# Patient Record
Sex: Female | Born: 1945 | Race: White | Hispanic: No | Marital: Married | Smoking: Former smoker
Health system: Southern US, Community
[De-identification: ages and names within clinical notes are randomized; demographics above are authoritative.]

## PROBLEM LIST (undated history)

## (undated) DIAGNOSIS — D649 Anemia, unspecified: Secondary | ICD-10-CM

## (undated) DIAGNOSIS — R7303 Prediabetes: Secondary | ICD-10-CM

## (undated) DIAGNOSIS — J189 Pneumonia, unspecified organism: Secondary | ICD-10-CM

## (undated) DIAGNOSIS — F419 Anxiety disorder, unspecified: Secondary | ICD-10-CM

## (undated) DIAGNOSIS — R519 Headache, unspecified: Secondary | ICD-10-CM

## (undated) DIAGNOSIS — Z87442 Personal history of urinary calculi: Secondary | ICD-10-CM

## (undated) DIAGNOSIS — J329 Chronic sinusitis, unspecified: Secondary | ICD-10-CM

## (undated) DIAGNOSIS — F32A Depression, unspecified: Secondary | ICD-10-CM

## (undated) DIAGNOSIS — R51 Headache: Secondary | ICD-10-CM

## (undated) DIAGNOSIS — K219 Gastro-esophageal reflux disease without esophagitis: Secondary | ICD-10-CM

## (undated) DIAGNOSIS — G4733 Obstructive sleep apnea (adult) (pediatric): Secondary | ICD-10-CM

## (undated) DIAGNOSIS — M1990 Unspecified osteoarthritis, unspecified site: Secondary | ICD-10-CM

## (undated) DIAGNOSIS — C9110 Chronic lymphocytic leukemia of B-cell type not having achieved remission: Secondary | ICD-10-CM

## (undated) DIAGNOSIS — R7309 Other abnormal glucose: Secondary | ICD-10-CM

## (undated) DIAGNOSIS — I10 Essential (primary) hypertension: Secondary | ICD-10-CM

## (undated) DIAGNOSIS — F329 Major depressive disorder, single episode, unspecified: Secondary | ICD-10-CM

## (undated) DIAGNOSIS — D126 Benign neoplasm of colon, unspecified: Secondary | ICD-10-CM

## (undated) HISTORY — PX: FUNCTIONAL ENDOSCOPIC SINUS SURGERY: SUR616

## (undated) HISTORY — PX: CATARACT EXTRACTION, BILATERAL: SHX1313

## (undated) HISTORY — DX: Prediabetes: R73.03

## (undated) HISTORY — PX: APPENDECTOMY: SHX54

## (undated) HISTORY — DX: Unspecified osteoarthritis, unspecified site: M19.90

## (undated) HISTORY — PX: LYMPH NODE BIOPSY: SHX201

## (undated) HISTORY — DX: Anxiety disorder, unspecified: F41.9

## (undated) HISTORY — PX: BREAST SURGERY: SHX581

## (undated) HISTORY — PX: TEMPOROMANDIBULAR JOINT ARTHROPLASTY: SUR76

## (undated) HISTORY — PX: ABDOMINAL HYSTERECTOMY: SHX81

## (undated) HISTORY — PX: TONSILLECTOMY AND ADENOIDECTOMY: SUR1326

## (undated) HISTORY — DX: Benign neoplasm of colon, unspecified: D12.6

## (undated) HISTORY — DX: Obstructive sleep apnea (adult) (pediatric): G47.33

## (undated) HISTORY — DX: Other abnormal glucose: R73.09

## (undated) HISTORY — PX: SHOULDER ARTHROSCOPY: SHX128

---

## 1983-12-06 HISTORY — PX: CHOLECYSTECTOMY: SHX55

## 2000-03-07 ENCOUNTER — Other Ambulatory Visit: Admission: RE | Admit: 2000-03-07 | Discharge: 2000-03-07 | Payer: Self-pay | Admitting: Obstetrics and Gynecology

## 2001-06-24 ENCOUNTER — Emergency Department (HOSPITAL_COMMUNITY): Admission: EM | Admit: 2001-06-24 | Discharge: 2001-06-24 | Payer: Self-pay | Admitting: Emergency Medicine

## 2001-06-24 ENCOUNTER — Encounter: Payer: Self-pay | Admitting: Internal Medicine

## 2003-07-31 ENCOUNTER — Other Ambulatory Visit: Admission: RE | Admit: 2003-07-31 | Discharge: 2003-07-31 | Payer: Self-pay | Admitting: Obstetrics and Gynecology

## 2004-05-13 ENCOUNTER — Ambulatory Visit (HOSPITAL_COMMUNITY): Admission: RE | Admit: 2004-05-13 | Discharge: 2004-05-13 | Payer: Self-pay | Admitting: Internal Medicine

## 2006-04-26 ENCOUNTER — Ambulatory Visit (HOSPITAL_COMMUNITY): Admission: RE | Admit: 2006-04-26 | Discharge: 2006-04-26 | Payer: Self-pay | Admitting: Internal Medicine

## 2006-07-10 ENCOUNTER — Ambulatory Visit (HOSPITAL_COMMUNITY): Admission: RE | Admit: 2006-07-10 | Discharge: 2006-07-10 | Payer: Self-pay | Admitting: Internal Medicine

## 2007-09-25 ENCOUNTER — Encounter: Admission: RE | Admit: 2007-09-25 | Discharge: 2007-09-25 | Payer: Self-pay | Admitting: Otolaryngology

## 2008-09-11 ENCOUNTER — Ambulatory Visit (HOSPITAL_COMMUNITY): Admission: RE | Admit: 2008-09-11 | Discharge: 2008-09-11 | Payer: Self-pay | Admitting: Internal Medicine

## 2008-11-18 ENCOUNTER — Ambulatory Visit: Payer: Self-pay | Admitting: Oncology

## 2008-12-10 LAB — CBC & DIFF AND RETIC
Basophils Absolute: 0 10*3/uL (ref 0.0–0.1)
EOS%: 2.5 % (ref 0.0–7.0)
Eosinophils Absolute: 0.3 10*3/uL (ref 0.0–0.5)
LYMPH%: 48.2 % — ABNORMAL HIGH (ref 14.0–48.0)
MCH: 33.4 pg (ref 26.0–34.0)
MCV: 95.5 fL (ref 81.0–101.0)
MONO%: 4.8 % (ref 0.0–13.0)
NEUT#: 4.9 10*3/uL (ref 1.5–6.5)
Platelets: 234 10*3/uL (ref 145–400)
RBC: 3.87 10*6/uL (ref 3.70–5.32)
Retic %: 1.3 % (ref 0.4–2.3)

## 2008-12-10 LAB — MORPHOLOGY

## 2008-12-10 LAB — CHCC SMEAR

## 2008-12-12 LAB — COMPREHENSIVE METABOLIC PANEL
ALT: 25 U/L (ref 0–35)
AST: 25 U/L (ref 0–37)
Alkaline Phosphatase: 69 U/L (ref 39–117)
Sodium: 139 mEq/L (ref 135–145)
Total Bilirubin: 0.6 mg/dL (ref 0.3–1.2)
Total Protein: 7 g/dL (ref 6.0–8.3)

## 2008-12-12 LAB — BETA 2 MICROGLOBULIN, SERUM: Beta-2 Microglobulin: 1.48 mg/L (ref 1.01–1.73)

## 2008-12-12 LAB — IMMUNOFIXATION ELECTROPHORESIS

## 2008-12-12 LAB — URIC ACID: Uric Acid, Serum: 7.4 mg/dL — ABNORMAL HIGH (ref 2.4–7.0)

## 2008-12-12 LAB — DIRECT ANTIGLOBULIN TEST (NOT AT ARMC)
DAT (Complement): NEGATIVE
DAT IgG: NEGATIVE

## 2008-12-18 ENCOUNTER — Encounter: Admission: RE | Admit: 2008-12-18 | Discharge: 2008-12-18 | Payer: Self-pay | Admitting: Oncology

## 2009-04-22 ENCOUNTER — Ambulatory Visit: Payer: Self-pay | Admitting: Oncology

## 2009-07-21 ENCOUNTER — Ambulatory Visit: Payer: Self-pay | Admitting: Oncology

## 2009-07-23 LAB — CBC WITH DIFFERENTIAL/PLATELET
Basophils Absolute: 0 10*3/uL (ref 0.0–0.1)
Eosinophils Absolute: 0.3 10*3/uL (ref 0.0–0.5)
HCT: 34.5 % — ABNORMAL LOW (ref 34.8–46.6)
HGB: 12 g/dL (ref 11.6–15.9)
LYMPH%: 57.8 % — ABNORMAL HIGH (ref 14.0–49.7)
MCV: 95.6 fL (ref 79.5–101.0)
MONO#: 0.6 10*3/uL (ref 0.1–0.9)
MONO%: 4.7 % (ref 0.0–14.0)
NEUT#: 4.4 10*3/uL (ref 1.5–6.5)
Platelets: 229 10*3/uL (ref 145–400)
WBC: 12.6 10*3/uL — ABNORMAL HIGH (ref 3.9–10.3)

## 2009-07-23 LAB — MORPHOLOGY: PLT EST: ADEQUATE

## 2009-10-21 ENCOUNTER — Ambulatory Visit: Payer: Self-pay | Admitting: Oncology

## 2009-10-23 LAB — CBC WITH DIFFERENTIAL/PLATELET
Basophils Absolute: 0.1 10*3/uL (ref 0.0–0.1)
Eosinophils Absolute: 0.4 10*3/uL (ref 0.0–0.5)
HCT: 37.6 % (ref 34.8–46.6)
HGB: 12.8 g/dL (ref 11.6–15.9)
LYMPH%: 64 % — ABNORMAL HIGH (ref 14.0–49.7)
MCV: 94.5 fL (ref 79.5–101.0)
MONO%: 3.5 % (ref 0.0–14.0)
NEUT#: 6.2 10*3/uL (ref 1.5–6.5)
NEUT%: 30.5 % — ABNORMAL LOW (ref 38.4–76.8)
Platelets: 190 10*3/uL (ref 145–400)
RBC: 3.98 10*6/uL (ref 3.70–5.45)

## 2010-01-05 DIAGNOSIS — J189 Pneumonia, unspecified organism: Secondary | ICD-10-CM

## 2010-01-05 HISTORY — DX: Pneumonia, unspecified organism: J18.9

## 2010-01-12 ENCOUNTER — Ambulatory Visit: Payer: Self-pay | Admitting: Oncology

## 2010-01-14 LAB — CBC WITH DIFFERENTIAL/PLATELET
Basophils Absolute: 0.1 10*3/uL (ref 0.0–0.1)
EOS%: 0.9 % (ref 0.0–7.0)
LYMPH%: 81.1 % — ABNORMAL HIGH (ref 14.0–49.7)
MCH: 32.8 pg (ref 25.1–34.0)
MCV: 96.4 fL (ref 79.5–101.0)
MONO%: 2.3 % (ref 0.0–14.0)
Platelets: 151 10*3/uL (ref 145–400)
RBC: 3.84 10*6/uL (ref 3.70–5.45)
RDW: 13.7 % (ref 11.2–14.5)

## 2010-01-14 LAB — MORPHOLOGY
PLT EST: ADEQUATE
RBC Comments: NORMAL

## 2010-04-07 ENCOUNTER — Ambulatory Visit: Admission: RE | Admit: 2010-04-07 | Discharge: 2010-04-07 | Payer: Self-pay | Admitting: Internal Medicine

## 2010-04-07 ENCOUNTER — Encounter (INDEPENDENT_AMBULATORY_CARE_PROVIDER_SITE_OTHER): Payer: Self-pay | Admitting: Internal Medicine

## 2010-04-07 ENCOUNTER — Ambulatory Visit: Payer: Self-pay | Admitting: Surgery

## 2010-04-15 ENCOUNTER — Ambulatory Visit: Payer: Self-pay | Admitting: Oncology

## 2010-04-16 LAB — COMPREHENSIVE METABOLIC PANEL
ALT: 30 U/L (ref 0–35)
AST: 44 U/L — ABNORMAL HIGH (ref 0–37)
Albumin: 4.1 g/dL (ref 3.5–5.2)
Alkaline Phosphatase: 50 U/L (ref 39–117)
Calcium: 10 mg/dL (ref 8.4–10.5)
Chloride: 98 mEq/L (ref 96–112)
Potassium: 3.7 mEq/L (ref 3.5–5.3)
Sodium: 133 mEq/L — ABNORMAL LOW (ref 135–145)

## 2010-04-16 LAB — CBC WITH DIFFERENTIAL/PLATELET
Basophils Absolute: 0.1 10*3/uL (ref 0.0–0.1)
Eosinophils Absolute: 0.2 10*3/uL (ref 0.0–0.5)
HGB: 12.2 g/dL (ref 11.6–15.9)
LYMPH%: 69.4 % — ABNORMAL HIGH (ref 14.0–49.7)
MCH: 33.2 pg (ref 25.1–34.0)
MCV: 96.4 fL (ref 79.5–101.0)
MONO%: 3.8 % (ref 0.0–14.0)
NEUT#: 5 10*3/uL (ref 1.5–6.5)
Platelets: 217 10*3/uL (ref 145–400)

## 2010-07-14 ENCOUNTER — Ambulatory Visit: Payer: Self-pay | Admitting: Oncology

## 2010-07-16 LAB — CBC & DIFF AND RETIC
Basophils Absolute: 0 10*3/uL (ref 0.0–0.1)
EOS%: 1.1 % (ref 0.0–7.0)
Eosinophils Absolute: 0.3 10*3/uL (ref 0.0–0.5)
LYMPH%: 78.1 % — ABNORMAL HIGH (ref 14.0–49.7)
MCH: 32.6 pg (ref 25.1–34.0)
MCV: 94 fL (ref 79.5–101.0)
MONO%: 2.6 % (ref 0.0–14.0)
NEUT#: 5.2 10*3/uL (ref 1.5–6.5)
Platelets: 140 10*3/uL — ABNORMAL LOW (ref 145–400)
RBC: 3.84 10*6/uL (ref 3.70–5.45)
Retic %: 1.52 % — ABNORMAL HIGH (ref 0.50–1.50)

## 2010-07-16 LAB — MORPHOLOGY

## 2010-08-13 ENCOUNTER — Encounter: Admission: RE | Admit: 2010-08-13 | Discharge: 2010-08-13 | Payer: Self-pay | Admitting: Family Medicine

## 2010-08-17 ENCOUNTER — Ambulatory Visit: Payer: Self-pay | Admitting: Oncology

## 2010-08-20 LAB — CBC WITH DIFFERENTIAL/PLATELET
BASO%: 0.5 % (ref 0.0–2.0)
Basophils Absolute: 0.2 10*3/uL — ABNORMAL HIGH (ref 0.0–0.1)
EOS%: 0.5 % (ref 0.0–7.0)
MCH: 32.3 pg (ref 25.1–34.0)
MCHC: 33.2 g/dL (ref 31.5–36.0)
MCV: 97.4 fL (ref 79.5–101.0)
MONO%: 2.2 % (ref 0.0–14.0)
RBC: 3.76 10*6/uL (ref 3.70–5.45)
RDW: 13.9 % (ref 11.2–14.5)
lymph#: 28.7 10*3/uL — ABNORMAL HIGH (ref 0.9–3.3)

## 2010-11-08 ENCOUNTER — Ambulatory Visit (HOSPITAL_BASED_OUTPATIENT_CLINIC_OR_DEPARTMENT_OTHER): Payer: 59 | Admitting: Oncology

## 2010-11-09 LAB — COMPREHENSIVE METABOLIC PANEL
ALT: 34 U/L (ref 0–35)
CO2: 28 mEq/L (ref 19–32)
Calcium: 10.1 mg/dL (ref 8.4–10.5)
Chloride: 93 mEq/L — ABNORMAL LOW (ref 96–112)
Sodium: 133 mEq/L — ABNORMAL LOW (ref 135–145)
Total Protein: 6.9 g/dL (ref 6.0–8.3)

## 2010-11-09 LAB — MORPHOLOGY: PLT EST: ADEQUATE

## 2010-11-09 LAB — CBC & DIFF AND RETIC
Eosinophils Absolute: 0.2 10*3/uL (ref 0.0–0.5)
HCT: 35.1 % (ref 34.8–46.6)
Immature Retic Fract: 13.7 % — ABNORMAL HIGH (ref 0.00–10.70)
LYMPH%: 83.8 % — ABNORMAL HIGH (ref 14.0–49.7)
MONO#: 0.7 10*3/uL (ref 0.1–0.9)
NEUT#: 5.2 10*3/uL (ref 1.5–6.5)
NEUT%: 13.7 % — ABNORMAL LOW (ref 38.4–76.8)
Platelets: 141 10*3/uL — ABNORMAL LOW (ref 145–400)

## 2010-11-09 LAB — LACTATE DEHYDROGENASE: LDH: 201 U/L (ref 94–250)

## 2010-12-26 ENCOUNTER — Encounter: Payer: Self-pay | Admitting: Internal Medicine

## 2010-12-27 ENCOUNTER — Inpatient Hospital Stay (HOSPITAL_COMMUNITY)
Admission: EM | Admit: 2010-12-27 | Discharge: 2010-12-29 | Payer: Self-pay | Source: Home / Self Care | Attending: Internal Medicine | Admitting: Internal Medicine

## 2010-12-28 LAB — URINALYSIS, ROUTINE W REFLEX MICROSCOPIC
Bilirubin Urine: NEGATIVE
Hgb urine dipstick: NEGATIVE
Nitrite: NEGATIVE
Protein, ur: 30 mg/dL — AB
Specific Gravity, Urine: 1.009 (ref 1.005–1.030)
Urobilinogen, UA: 1 mg/dL (ref 0.0–1.0)
pH: 7 (ref 5.0–8.0)

## 2010-12-28 LAB — DIFFERENTIAL
Basophils Absolute: 0 10*3/uL (ref 0.0–0.1)
Lymphocytes Relative: 85 % — ABNORMAL HIGH (ref 12–46)
Monocytes Relative: 1 % — ABNORMAL LOW (ref 3–12)
Neutro Abs: 5.3 10*3/uL (ref 1.7–7.7)

## 2010-12-28 LAB — URINE MICROSCOPIC-ADD ON

## 2010-12-28 LAB — CBC
Hemoglobin: 13.2 g/dL (ref 12.0–15.0)
MCH: 33.8 pg (ref 26.0–34.0)
RBC: 3.9 MIL/uL (ref 3.87–5.11)
WBC: 37.6 10*3/uL — ABNORMAL HIGH (ref 4.0–10.5)

## 2010-12-28 LAB — BASIC METABOLIC PANEL
Calcium: 9.2 mg/dL (ref 8.4–10.5)
Creatinine, Ser: 1.09 mg/dL (ref 0.4–1.2)
GFR calc Af Amer: >60 mL/min (ref >60)
GFR calc non Af Amer: 51 mL/min — ABNORMAL LOW (ref >60)

## 2010-12-28 LAB — POCT CARDIAC MARKERS
CKMB, poc: <1 ng/mL — ABNORMAL LOW (ref 1.0–8.0)
Myoglobin, poc: 223 ng/mL (ref 12–200)
Troponin i, poc: <0.05 ng/mL (ref 0.00–0.09)

## 2010-12-29 LAB — URINE CULTURE
Colony Count: 35000
Culture  Setup Time: 201201240146

## 2010-12-29 LAB — BASIC METABOLIC PANEL
BUN: 12 mg/dL (ref 6–23)
Chloride: 98 mEq/L (ref 96–112)
Creatinine, Ser: 0.92 mg/dL (ref 0.4–1.2)
GFR calc Af Amer: >60 mL/min (ref >60)
GFR calc non Af Amer: >60 mL/min (ref >60)

## 2010-12-29 LAB — CBC
MCH: 32.8 pg (ref 26.0–34.0)
MCV: 96.1 fL (ref 78.0–100.0)
Platelets: 127 10*3/uL — ABNORMAL LOW (ref 150–400)
RBC: 3.57 MIL/uL — ABNORMAL LOW (ref 3.87–5.11)
RDW: 12.9 % (ref 11.5–15.5)
WBC: 34.9 10*3/uL — ABNORMAL HIGH (ref 4.0–10.5)

## 2010-12-30 LAB — URINE CULTURE: Colony Count: 30000

## 2010-12-30 NOTE — Discharge Summary (Addendum)
NAME:  Rebecca Bond, Rebecca Bond NO.:  [id number]  MEDICAL RECORD NO.:  [id number]          PATIENT TYPE:  INP  LOCATION:  1501                         FACILITY:  [facility]  PHYSICIAN:  Andreas Blower, MD       DATE OF BIRTH:  [birth year]  DATE OF ADMISSION:  12/27/2010 DATE OF DISCHARGE:                              DISCHARGE SUMMARY   PRIMARY CARE PHYSICIAN:  [name], M.D.  ONCOLOGIST:  Genene Churn. Cyndie Chime, M.D.  DISCHARGE DIAGNOSIS: 1. Community-acquired pneumonia. 2. Urinary tract infection. 3. History of chronic lymphocytic leukemia. 4. Leukocytosis. 5. Hypertension. 6. Anxiety. 7. Remote history of hysterectomy. 8. Remote history of cholecystectomy. 9. Remote temporomandibular joint surgery.  DISCHARGE MEDICATIONS: 1. Robitussin DM 5 mL every 4 hours as needed for cough. 2. Levofloxacin 500 mg p.o. daily for six more days to complete 8-day     course of antibiotics. 3. Alprazolam 0.25 mg p.o. daily as needed for anxiety. 4. Ascorbic acid 500 mg over-the-counter p.o. daily. 5. Atenolol 100 mg p.o. daily. 6. Bupropion extended release 200 mg p.o. daily. 7. Calcium citrate 500 mg p.o. daily. 8. Fish oil 2000 mg p.o. daily. 9. Hydrochlorothiazide 25 mg p.o. daily. 10.Vesicare 10 mg p.o. daily. 11.Vitamin D2, 2000 units 3 tablets p.o. daily. 12.Xyzal 5 mg daily as needed for allergies.  BRIEF ADMITTING HISTORY AND PHYSICAL:  Rebecca Bond is a 65 year old, Caucasian female with a history of stage I CLL and hypertension who presented with complaints of productive cough and fever.  RADIOLOGY:  The patient had a chest x-ray, two-view, which shows patchy infiltrative density in the left base, more likely reflects atelectasis and pneumonia. The patient had another chest x-ray, two-view, on December 28, 2010, which shows no change in the left lower lung lobe pneumonia.  LABORATORY DATA:  CBC shows a white count of 34.9.  Initial white count on admission was  37.6, hemoglobin 11.7, hematocrit 34.3, platelet count 127.  Electrolytes normal with a creatinine of 0.92, except sodium is 133.  UA initially on admission was negative for nitrates and had large leukocytes.  HOSPITAL COURSE BY PROBLEM: 1. Community-acquired pneumonia.  The patient had failed outpatient     treatment on azithromycin.  The patient was started on     levofloxacin.  The patient was given IV dose for two days and was     transitioned to oral dose at the time of discharge.  The patient is     to continue levofloxacin for six more days to complete an 8-day     course of antibiotics. 2. Urinary tract infection.  The patient was again started on     levofloxacin, which she will continue for six more days. 3. History of chronic lymphocytic leukemia.  The patient to follow     with laboratory requirement for treatment as an outpatient. 4. Leukocytosis, most likely secondary to chronic lymphocytic leukemia     with recent community-acquired pneumonia and urinary tract     infection. 5. Hypertension, stable.  Continue on home medications. 6. Anxiety.  Continue the patient on alprazolam.  DISPOSITION AND FOLLOWUP:  The patient will follow  up with her primary care physician in 1-2 weeks.  If her symptoms are not improving at that time, consider getting a repeat chest x-ray at that time.  Time spent on discharge talking to the patient and coordinating care was 25 minutes.   Andreas Blower, MD   SR/MEDQ  D:  12/29/2010  T:  12/29/2010  Job:  [id number]  cc:   [name], M.D. Fax: [phone]  Genene Churn. Cyndie Chime, M.D. Fax: [phone]  Electronically Signed by Wardell Heath Eston Heslin  on 12/30/2010 08:24:20 PM

## 2011-02-08 ENCOUNTER — Encounter: Payer: 59 | Admitting: Oncology

## 2011-02-08 DIAGNOSIS — C9110 Chronic lymphocytic leukemia of B-cell type not having achieved remission: Secondary | ICD-10-CM

## 2011-02-08 LAB — CBC WITH DIFFERENTIAL/PLATELET
BASO%: 0.2 % (ref 0.0–2.0)
EOS%: 0.7 % (ref 0.0–7.0)
HCT: 35.1 % (ref 34.8–46.6)
MCH: 32.8 pg (ref 25.1–34.0)
MCHC: 34.5 g/dL (ref 31.5–36.0)
MONO%: 1.6 % (ref 0.0–14.0)
NEUT%: 10.4 % — ABNORMAL LOW (ref 38.4–76.8)
RDW: 13.6 % (ref 11.2–14.5)
lymph#: 47 10*3/uL — ABNORMAL HIGH (ref 0.9–3.3)

## 2011-02-15 NOTE — H&P (Signed)
NAME:  Rebecca Bond, PUGA              ACCOUNT NO.:  [id number]  MEDICAL RECORD NO.:  [id number]          PATIENT TYPE:  INP  LOCATION:  0102                         [facility]ACILITY:  [facility]  PHYSICIAN:  Erick Blinks, MD     DATE OF BIRTH:  06-Mar-1946  DATE OF ADMISSION:  12/27/2010 DATE OF DISCHARGE:                             HISTORY & PHYSICAL   PRIMARY CARE PHYSICIAN:  [name], MD  ONCOLOGIST:  Genene Churn. Granfortuna, MD  CHIEF COMPLAINT:  Persistent cough and fever.  HISTORY OF PRESENT ILLNESS:  The patient is a 65 year old with past medical history of stage I CLL and hypertension, who presents to [facility] emergency department today with complaints of persistent productive cough and fever.  The patient was seen at her primary care physician's office on December 22, 2010 with complaints of cough, fever, and malaise. At that time, the patient was treated with a prednisone taper and azithromycin for suspected bronchitis.  The patient's antibiotics prescription ended on December 26, 2010.  She has one more day of prednisone to take; however, the patient describes persistent chills with low-grade fever yesterday as well as continued cough productive of thick sputum.  The patient also reports decreased p.o. intake.  The patient states she only feels shortness of breath with "coughing fits" that have been uncontrollable.  She does report some rib pain with coughing.  She denies any recent chest pain.  She denies any recent abdominal pain, vomiting, or diarrhea.  Of note, the patient was seen by her oncologist in December 2011, at which time her white blood cell count was 38,000, platelet count at that time was 183,000.  Chest x-ray upon admission evaluation reveals a left lower lobe pneumonia.  The patient is to be admitted at by triad hospitalist at this time for further evaluation and treatment.  PAST MEDICAL HISTORY: 1. CLL, stage I. 2. Hypertension. 3. Remote  hysterectomy. 4. Remote cholecystectomy. 5. Remote TMJ surgery.  MEDICATIONS: 1. HCTZ 25 mg p.o. daily. 2. Bupropion 300 mg p.o. daily. 3. Atenolol 100 mg p.o. daily. 4. Xanax 0.5 mg taken p.r.n. anxiety. 5. Vesicare 10 mg p.o. daily.  ALLERGIES: 1. HYZAAR causes angioedema. 2. DECONGESTANTS cause palpitations.  FAMILY HISTORY:  Has been reviewed and is noncontributory to this admission.  SOCIAL HISTORY:  The patient is married.  She reports remote history of tobacco abuse.  She has not smoked in over 40 years.  No EtOH use.  REVIEW OF SYSTEMS:  As stated in HPI, otherwise negative.  The patient denies abdominal pain, vomiting, diarrhea, and dysuria  PHYSICAL EXAMINATION:  VITAL SIGNS:  Blood pressure 150/78, heart rate 67, respirations 18, temperature 98.2, O2 sat is 95% on room air. GENERAL:  This is a well-nourished, well-developed female sitting upright in stretcher in no acute distress. HEENT:  Head is normocephalic, atraumatic.  Eyes, extraocular movements intact without scleral icterus or injection.  Ear, nose and throat, mucous membranes are dry.  No oropharyngeal lesions noted. NECK:  Supple with no thyromegaly or lymphadenopathy.  No JVD or carotid bruits. CHEST:  Symmetrical movement. CARDIOVASCULAR:  S1 and S2 regular rate and rhythm.  No murmurs, rub, or gallops. LOWER EXTREMITY:  No edema. RESPIRATORY:  Patient with scattered rales, very mild expiratory wheezing.  No increased work of breathing.  The patient does experienced persistent cough with deep inspiration. GASTROINTESTINAL:  Abdomen is soft, nontender, nondistended with positive bowel sounds.  No appreciated masses or hepatosplenomegaly. NEUROLOGICAL:  The patient is able to move all extremities x4 without motor, sensory deficit on exam. PSYCHOLOGICAL:  The patient is alert and oriented x4 and slightly anxious.  LABORATORY DATA:  White cell count 37.6 with an 85% lymphocytes, platelets 126,  hemoglobin 13.2, hematocrit 37.1, sodium 131, potassium 3.5, chloride 94, CO2 24, BUN 20, creatinine 1.09 up from 0.87 in December 2011, serum glucose 86.  Cardiac point-of-care are negative x1.  Urinalysis amber, cloudy with specific gravity of 1.02, small bilirubin, trace ketones and large leukocytes.  Urine microscopic shows many epithelials with 21 to 50 WBCs.  Chest x-ray shows a left lower lobe pneumonia.  ASSESSMENT/PLAN: 1. Community-acquired pneumonia.  The patient has failed outpatient     antibiotic therapy with azithromycin.  We will admit the patient at     this time to regular unit.  We will order for empiric Levaquin for     coverage of possible urinary tract infection and pneumonia.  We     will check sputum culture and sensitivity as well as blood cultures     should the patient develop fever greater than 101.0.  we will     recheck the patient's chest x-ray in the morning to determine if     any improvement and order for supportive treatment. 2. Acute renal failure.  Suspect prerenal in nature given dehydration     in setting of decreased p.o. intake and continued diuretic use.  We     will order for gentle IV fluids.  We will recheck the patient's     creatinine in the morning and hold home diuretic. 3. Possible urinary tract infection.  Empiric treatment along with     pneumonia.  We will recollect specimen and sent for culture and     sensitivity. 4. Chronic lymphocytic leukemia, seemingly stable with today's blood     count being similar to those obtained in     December 2011.  The patient is scheduled follow up with Dr.     Cyndie Chime next month. 5. History of hypertension.  We will continue beta-blocker therapy for     now. 6. Prophylaxis.  We will order for SCDs for DVT prophylaxis.     Cordelia Pen, NP   ______________________________ Erick Blinks, MD    LE/MEDQ  D:  12/27/2010  T:  12/27/2010  Job:  [id number]  cc:   [name],  M.D. Fax: [phone]  Genene Churn. Cyndie Chime, M.D. Fax: [phone]  Electronically Signed by Cordelia Pen NP on 01/21/2011 12:03:55 PM Electronically Signed by Durward Mallard Zaydah Nawabi  on 02/15/2011 05:40:57 PM

## 2011-07-22 ENCOUNTER — Encounter (HOSPITAL_BASED_OUTPATIENT_CLINIC_OR_DEPARTMENT_OTHER): Payer: 59 | Admitting: Oncology

## 2011-07-22 ENCOUNTER — Other Ambulatory Visit: Payer: Self-pay | Admitting: Oncology

## 2011-07-22 DIAGNOSIS — C9110 Chronic lymphocytic leukemia of B-cell type not having achieved remission: Secondary | ICD-10-CM

## 2011-07-22 LAB — COMPREHENSIVE METABOLIC PANEL
Albumin: 3.9 g/dL (ref 3.5–5.2)
CO2: 27 mEq/L (ref 19–32)
Calcium: 9.7 mg/dL (ref 8.4–10.5)
Glucose, Bld: 97 mg/dL (ref 70–99)
Potassium: 4.1 mEq/L (ref 3.5–5.3)
Sodium: 129 mEq/L — ABNORMAL LOW (ref 135–145)
Total Bilirubin: 0.4 mg/dL (ref 0.3–1.2)
Total Protein: 6.8 g/dL (ref 6.0–8.3)

## 2011-07-22 LAB — CBC & DIFF AND RETIC
Basophils Absolute: 0 10*3/uL (ref 0.0–0.1)
Eosinophils Absolute: 0.2 10*3/uL (ref 0.0–0.5)
HCT: 35.1 % (ref 34.8–46.6)
HGB: 11.8 g/dL (ref 11.6–15.9)
Immature Retic Fract: 12.8 % — ABNORMAL HIGH (ref 1.60–10.00)
LYMPH%: 83.9 % — ABNORMAL HIGH (ref 14.0–49.7)
MCHC: 33.7 g/dL (ref 31.5–36.0)
MONO#: 0.9 10*3/uL (ref 0.1–0.9)
NEUT#: 13.5 10*3/uL — ABNORMAL HIGH (ref 1.5–6.5)
NEUT%: 14.9 % — ABNORMAL LOW (ref 38.4–76.8)
Platelets: 194 10*3/uL (ref 145–400)
Retic Ct Abs: 83.9 10*3/uL (ref 33.70–90.70)
WBC: 90.7 10*3/uL (ref 3.9–10.3)
lymph#: 76.1 10*3/uL — ABNORMAL HIGH (ref 0.9–3.3)

## 2011-07-22 LAB — LACTATE DEHYDROGENASE: LDH: 236 U/L (ref 94–250)

## 2011-07-26 LAB — IMMUNOFIXATION ELECTROPHORESIS
IgM, Serum: 32 mg/dL — ABNORMAL LOW (ref 52–322)
Total Protein, Serum Electrophoresis: 6.5 g/dL (ref 6.0–8.3)

## 2011-09-19 ENCOUNTER — Encounter (HOSPITAL_BASED_OUTPATIENT_CLINIC_OR_DEPARTMENT_OTHER): Payer: 59 | Admitting: Oncology

## 2011-09-19 ENCOUNTER — Other Ambulatory Visit: Payer: Self-pay | Admitting: Oncology

## 2011-09-19 DIAGNOSIS — C9110 Chronic lymphocytic leukemia of B-cell type not having achieved remission: Secondary | ICD-10-CM

## 2011-09-19 LAB — CBC WITH DIFFERENTIAL/PLATELET
Basophils Absolute: 0.4 10*3/uL — ABNORMAL HIGH (ref 0.0–0.1)
EOS%: 0.3 % (ref 0.0–7.0)
HGB: 11.9 g/dL (ref 11.6–15.9)
MCH: 32.3 pg (ref 25.1–34.0)
MCHC: 33.7 g/dL (ref 31.5–36.0)
MCV: 95.9 fL (ref 79.5–101.0)
MONO%: 2.1 % (ref 0.0–14.0)
RBC: 3.68 10*6/uL — ABNORMAL LOW (ref 3.70–5.45)
RDW: 13.7 % (ref 11.2–14.5)

## 2011-09-19 LAB — MORPHOLOGY

## 2011-11-11 ENCOUNTER — Other Ambulatory Visit: Payer: Self-pay

## 2011-11-11 ENCOUNTER — Telehealth: Payer: Self-pay

## 2011-11-11 NOTE — Telephone Encounter (Signed)
Received VM from pt stating that she has a sinus or upper resp infection and is currently taking Mucinex, Advil, Vitamin C, along with nasal sprays to control her sx.  Pt is scheduled for lab on 12/10, but questions if she should r/s, d/t her WBC trending upwards.    Per Misty Stanley, NP, pt's lab appt to be pushed out 1 week.   Onc Tx scheduling encounter forwarded to scheduling to change appt from 12/10 to 12/17.   This information left on pt's personalized VM per her request.  dph

## 2011-11-14 ENCOUNTER — Other Ambulatory Visit: Payer: 59 | Admitting: Lab

## 2011-11-16 ENCOUNTER — Telehealth: Payer: Self-pay | Admitting: Oncology

## 2011-11-16 NOTE — Telephone Encounter (Signed)
Called pt, left message , lab draw before MD visit on 12/17th

## 2011-11-17 ENCOUNTER — Telehealth: Payer: Self-pay | Admitting: *Deleted

## 2011-11-17 NOTE — Telephone Encounter (Signed)
Message on Rebecca Bond called asking if she needs to keep her scheduled appts for lab 11-21-11 at 2:00pm and f/u 11-22-11 at 12:00 pm.  If she does need to keep these appointments she can't get away from work and they need to be rearranged.   Reports she is still taking amoxicillin 500 mg tid through 11-20-11.  Has a scratchy throat and low grade fever often in the afternoon.  Taking tylenol or ibuprofen.  Will notify providers.  Message left on patient's voice mail requesting a return call with work hours or times she'll be available if appts can be rescheduled.

## 2011-11-17 NOTE — Telephone Encounter (Signed)
Ms. Rebecca Bond called reporting she cannot come in early mornings or at lunch times.  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  Asked if the lab appointment could be at 4:00pm and the MD follow up at 3:30pm or 3:00pm If Dr. Cyndie Chime wants her to keep these dates and doesn't think the ibuprofen will affect her lab results.[name] Kitchen

## 2011-11-18 ENCOUNTER — Other Ambulatory Visit: Payer: Self-pay

## 2011-11-21 ENCOUNTER — Telehealth: Payer: Self-pay | Admitting: Oncology

## 2011-11-21 ENCOUNTER — Other Ambulatory Visit: Payer: 59 | Admitting: Lab

## 2011-11-21 NOTE — Telephone Encounter (Signed)
Called pt , left message that Dr Reece Agar first opening is late February 2013

## 2011-11-22 ENCOUNTER — Ambulatory Visit (HOSPITAL_BASED_OUTPATIENT_CLINIC_OR_DEPARTMENT_OTHER): Payer: 59 | Admitting: Oncology

## 2011-11-22 ENCOUNTER — Other Ambulatory Visit: Payer: Self-pay

## 2011-11-22 ENCOUNTER — Telehealth: Payer: Self-pay | Admitting: Oncology

## 2011-11-22 ENCOUNTER — Encounter: Payer: Self-pay | Admitting: Oncology

## 2011-11-22 ENCOUNTER — Other Ambulatory Visit: Payer: Self-pay | Admitting: Oncology

## 2011-11-22 ENCOUNTER — Ambulatory Visit: Payer: 59

## 2011-11-22 VITALS — BP 176/81 | HR 71 | Temp 97.1°F | Ht 63.5 in | Wt 193.1 lb

## 2011-11-22 DIAGNOSIS — Z8619 Personal history of other infectious and parasitic diseases: Secondary | ICD-10-CM

## 2011-11-22 DIAGNOSIS — I10 Essential (primary) hypertension: Secondary | ICD-10-CM

## 2011-11-22 DIAGNOSIS — C9110 Chronic lymphocytic leukemia of B-cell type not having achieved remission: Secondary | ICD-10-CM

## 2011-11-22 LAB — CBC WITH DIFFERENTIAL/PLATELET
BASO%: 0.2 % (ref 0.0–2.0)
Basophils Absolute: 0.1 10*3/uL (ref 0.0–0.1)
EOS%: 0.5 % (ref 0.0–7.0)
HCT: 34.7 % — ABNORMAL LOW (ref 34.8–46.6)
HGB: 11.9 g/dL (ref 11.6–15.9)
MONO#: 0.8 10*3/uL (ref 0.1–0.9)
NEUT%: 15.6 % — ABNORMAL LOW (ref 38.4–76.8)
RDW: 13.3 % (ref 11.2–14.5)
WBC: 52.2 10*3/uL (ref 3.9–10.3)
lymph#: 42.9 10*3/uL — ABNORMAL HIGH (ref 0.9–3.3)

## 2011-11-22 LAB — COMPREHENSIVE METABOLIC PANEL
ALT: 28 U/L (ref 0–35)
Albumin: 4.4 g/dL (ref 3.5–5.2)
CO2: 30 mEq/L (ref 19–32)
Potassium: 4.1 mEq/L (ref 3.5–5.3)
Sodium: 133 mEq/L — ABNORMAL LOW (ref 135–145)
Total Bilirubin: 0.4 mg/dL (ref 0.3–1.2)
Total Protein: 6.3 g/dL (ref 6.0–8.3)

## 2011-11-22 LAB — LACTATE DEHYDROGENASE: LDH: 215 U/L (ref 94–250)

## 2011-11-22 LAB — MORPHOLOGY: PLT EST: ADEQUATE

## 2011-11-22 MED ORDER — PNEUMOCOCCAL VAC POLYVALENT 25 MCG/0.5ML IJ INJ
0.5000 mL | INJECTION | INTRAMUSCULAR | Status: DC
  Filled 2011-11-22: qty 0.5

## 2011-11-22 NOTE — Telephone Encounter (Signed)
gve the pt her feb,april 2013 appt calendar

## 2011-11-22 NOTE — Progress Notes (Signed)
Followup visit for this 65 year old woman with stage I chronic lymphocytic leukemia diagnosed incidentally at time of a routine mammogram done in December 2009 which showed an enlarged left axillary lymph node. This was biopsied and consistent with CLL. Laboratory studies at that time with a total white count 11,056% lymphocytes hemoglobin 12.8 with a count 230,000. On clinical exam a single lymph node palpable high in the left axilla but no other areas of adenopathy or organomegaly. We have been monitoring blood counts in our office since diagnosis. She's had some mild wide fluctuations in the total white count which has gotten as high as 91,000 on  8/17. I believe she was on some steroids for a bronchitis at that time. Total white count 52,000 today with 82% lymphocytes. Stable hemoglobin of 11.9 and platelet count at 204,000.   She was hospitalized for a few days in January of this year for pneumonitis. She's had no other interim infections or other medical problems. Review of systems otherwise unremarkable.  Exam: Head and neck are normal. Oropharynx no erythema or exudate. Lung exam: Clear to auscultation resonant to percussion. Cardiac exam: Regular cardiac rhythm no murmur Lymph node exam: I'm unable to palpate any lymph nodes in the neck supraclavicular axillary or inguinal regions. Abdominal exam: Obese. No obvious mass or organomegaly. Extremities: No edema no calf tenderness. Neurologic exam: No focal deficits.  Impression: #1. Stage I chronic lymphocytic leukemia. A despite trend for white blood count to rise over time with some fluctuations, hemoglobin and platelets remain stable. No adenopathy or organomegaly on exam. I feel we are safe to continue observation alone. I am checking blood counts every 2 months and physical exams every 4 months. We may be able to reduce this if she continues to remain stable. She did get her flu vaccine this year. She also received a Pneumovax vaccine  when she was in the hospital in January and we were able to confirm this. At present we are not recommending the is asked her vaccine which is a live virus vaccine in immunocompromised patients. She does give a previous history of herpes zoster infection.  Copy dictation: Dr Glade Lloyd

## 2011-11-22 NOTE — Progress Notes (Signed)
Patient ID: Rebecca Bond, female   DOB: 12/24/1945, 65 y.o.   MRN: [id number]  

## 2011-11-22 NOTE — Progress Notes (Signed)
Patient ID: Rebecca Bond, female   DOB: April 01, 1946, 65 y.o.   MRN: [id number]

## 2012-01-10 ENCOUNTER — Telehealth: Payer: Self-pay | Admitting: *Deleted

## 2012-01-10 NOTE — Telephone Encounter (Signed)
Received call this am from pt reporting that she is just not feeling good but can't put her finger on it.  She reports feeling tired, nauseated & had a h/a yest & couldn't get out of bed to go to work.  She is at work today & no nausea or h/a but still feels a little tired.  She reports no fever & no other symptoms.  She has a lab appt 01/24/12.  Discussed with pt & Lonna Cobb NP & all are comfortable with just waiting to see if symptoms improve but if need be, we can move lab appt to sooner.  She will call us if this needs to happen.

## 2012-01-24 ENCOUNTER — Other Ambulatory Visit (HOSPITAL_BASED_OUTPATIENT_CLINIC_OR_DEPARTMENT_OTHER): Payer: 59 | Admitting: Lab

## 2012-01-24 ENCOUNTER — Telehealth: Payer: Self-pay

## 2012-01-24 DIAGNOSIS — C9110 Chronic lymphocytic leukemia of B-cell type not having achieved remission: Secondary | ICD-10-CM

## 2012-01-24 LAB — CBC WITH DIFFERENTIAL/PLATELET
Basophils Absolute: 0.5 10*3/uL — ABNORMAL HIGH (ref 0.0–0.1)
EOS%: 0.6 % (ref 0.0–7.0)
HCT: 34.1 % — ABNORMAL LOW (ref 34.8–46.6)
HGB: 11.7 g/dL (ref 11.6–15.9)
MCH: 32.3 pg (ref 25.1–34.0)
MCHC: 34.3 g/dL (ref 31.5–36.0)
MCV: 94.2 fL (ref 79.5–101.0)
MONO%: 2 % (ref 0.0–14.0)
NEUT%: 10.6 % — ABNORMAL LOW (ref 38.4–76.8)
RDW: 13.7 % (ref 11.2–14.5)

## 2012-01-24 NOTE — Telephone Encounter (Signed)
Message copied by Albertha Ghee on Tue Jan 24, 2012  5:06 PM ------      Message from: Levert Feinstein      Created: Tue Jan 24, 2012  5:00 PM       Call pt WBC stable - same as last time 52,000

## 2012-01-24 NOTE — Telephone Encounter (Signed)
Pt notified of lab results per Dr Patsy Lager note.  Pt verbalizes appreciation and understanding. dph

## 2012-02-07 ENCOUNTER — Encounter: Payer: Self-pay | Admitting: Gastroenterology

## 2012-03-19 ENCOUNTER — Telehealth: Payer: Self-pay

## 2012-03-19 NOTE — Telephone Encounter (Signed)
Received call from pt to notify Dr Cyndie Chime she was treated with "steroids and antibiotics" for a "sinus infection" from 3/28 - 4/3.  She continues to use Omnaris nasal spray daily.  Pt is scheduled for labwork tomorrow and questions if she should r/s.    Note to Dr Cyndie Chime.

## 2012-03-19 NOTE — Telephone Encounter (Signed)
Per Dr Cyndie Chime - keep lab appt tomorrow.  This information left on pt's VM per her request. dph

## 2012-03-20 ENCOUNTER — Telehealth: Payer: Self-pay | Admitting: *Deleted

## 2012-03-20 ENCOUNTER — Other Ambulatory Visit (HOSPITAL_BASED_OUTPATIENT_CLINIC_OR_DEPARTMENT_OTHER): Payer: 59 | Admitting: Lab

## 2012-03-20 DIAGNOSIS — C9110 Chronic lymphocytic leukemia of B-cell type not having achieved remission: Secondary | ICD-10-CM

## 2012-03-20 LAB — CBC WITH DIFFERENTIAL/PLATELET
BASO%: 0.2 % (ref 0.0–2.0)
EOS%: 0.3 % (ref 0.0–7.0)
HCT: 34.8 % (ref 34.8–46.6)
LYMPH%: 86.3 % — ABNORMAL HIGH (ref 14.0–49.7)
MCH: 32.1 pg (ref 25.1–34.0)
MCHC: 33.6 g/dL (ref 31.5–36.0)
MCV: 95.3 fL (ref 79.5–101.0)
MONO%: 1.5 % (ref 0.0–14.0)
NEUT%: 11.7 % — ABNORMAL LOW (ref 38.4–76.8)
Platelets: 145 10*3/uL (ref 145–400)

## 2012-03-20 NOTE — Telephone Encounter (Signed)
Pt notified of lab result per Dr. Cyndie Chime.

## 2012-03-20 NOTE — Telephone Encounter (Signed)
Message copied by Sabino Snipes on Tue Mar 20, 2012  5:26 PM ------      Message from: Levert Feinstein      Created: Tue Mar 20, 2012  4:29 PM       Call pt WBC 66,000 - within range of previous values despite steroids

## 2012-03-27 ENCOUNTER — Ambulatory Visit (HOSPITAL_BASED_OUTPATIENT_CLINIC_OR_DEPARTMENT_OTHER): Payer: 59 | Admitting: Oncology

## 2012-03-27 ENCOUNTER — Telehealth: Payer: Self-pay | Admitting: Oncology

## 2012-03-27 VITALS — BP 145/73 | HR 59 | Temp 97.1°F | Ht 63.5 in | Wt 190.3 lb

## 2012-03-27 DIAGNOSIS — C9110 Chronic lymphocytic leukemia of B-cell type not having achieved remission: Secondary | ICD-10-CM

## 2012-03-27 NOTE — Telephone Encounter (Signed)
Gave pt appt calendar for June 2013 lab only , August, lab and MD

## 2012-03-28 NOTE — Progress Notes (Signed)
Hematology and Oncology Follow Up Visit  Rebecca Bond [id number] 1946-08-24 66 y.o. 03/28/2012 10:29 AM   Principle Diagnosis: Encounter Diagnosis  Name Primary?  . CLL (chronic lymphocytic leukemia) Yes     Interim History:   A followup visit for this 66 year old woman with stage 1 chronic lymphocytic leukemia based on right axillary adenopathy found incidentally on a mammogram in December 2009. She has no other clinically palpable lymphadenopathy or splenomegaly.. She has not required any specific treatment to date. She has multiple environmental allergies. She was followed by Dr. Corinda Gubler for many years. He just retired and she is now seeing Dr. Juliann Mule. She had a flareup last week and had a steroid injection in her buttocks as well as a course of oral steroids. Not surprisingly this caused a elevation of her white count from her recent baseline of 52,000 recorded in December and again in February of this year to 66,000. 86% lymphocytes 12% neutrophils. Her hemoglobin has remained stable at approximately 12 g. Platelet count has fluctuated between 126 and 204,000 with current value 145,000. No trend for progressive decline.  Medications: reviewed  Allergies:  Allergies  Allergen Reactions  . Sudafed (Pseudoephedrine Hcl) Palpitations  . Iohexol      Code: HIVES, Desc: pt states she broke out in hive 20 yrs ago from iv contrast.  she also is refusing (1/14/9) to take benadryl or prednisone for premeds because of the anxiety effect they cause her.  done w/o on 1/14/9 @ [facility] Onset Date: [id number]     Review of Systems: Constitutional:   No constitutional symptoms. Respiratory: Recent bronchitis with flareup of allergies Cardiovascular: No chest pain or palpitations  Gastrointestinal: No abdominal pain or change in bowel habit Genito-Urinary: No urinary tract symptoms Musculoskeletal: No muscle or bone pain Neurologic: No headache or change in vision Skin: No rash or  ecchymosis she denies sensitivity to insect bites Remaining ROS negative.  Physical Exam: Blood pressure 145/73, pulse 59, temperature 97.1 F (36.2 C), temperature source Oral, height 5' 3.5" (1.613 m), weight 190 lb 4.8 oz (86.32 kg). Wt Readings from Last 3 Encounters:  03/27/12 190 lb 4.8 oz (86.32 kg)  11/22/11 193 lb 1.6 oz (87.59 kg)     General appearance: Well-nourished Caucasian woman HENNT: Pharynx no erythema or exudate  Lymph nodes: No cervical supraclavicular or axillary adenopathy Breasts: Not examined Lungs: Clear to auscultation resonant to percussion Heart: Regular rhythm no murmur Abdomen: Soft obese nontender no mass no obvious splenomegaly Extremities: No edema no calf tenderness Vascular: No cyanosis Neurologic: Motor strength 5 over 5, reflexes 1+ symmetric Skin:  Lab Results: Lab Results  Component Value Date   WBC 65.9* 03/20/2012   HGB 11.7 03/20/2012   HCT 34.8 03/20/2012   MCV 95.3 03/20/2012   PLT 145 03/20/2012     Chemistry      Component Value Date/Time   NA 133* 11/22/2011 1205   K 4.1 11/22/2011 1205   CL 93* 11/22/2011 1205   CO2 30 11/22/2011 1205   BUN 13 11/22/2011 1205   CREATININE 0.73 11/22/2011 1205      Component Value Date/Time   CALCIUM 9.4 11/22/2011 1205   ALKPHOS 65 11/22/2011 1205   AST 29 11/22/2011 1205   ALT 28 11/22/2011 1205   BILITOT 0.4 11/22/2011 1205       Impression and Plan: #1. Stage 1 chronic lymphocytic leukemia. Trend for white count to be slowly increasing over time but hemoglobin and platelets stable so observation alone  remains the treatment of choice for now. She understands that at some point in the fusion that she might need to go on treatment.   CC:. Dr. [name]; Dr. Conni Elliot, MD 4/24/201310:29 AM

## 2012-04-02 ENCOUNTER — Telehealth: Payer: Self-pay | Admitting: *Deleted

## 2012-04-02 NOTE — Telephone Encounter (Signed)
patient called in and changed appointment to 05-30-2012 at 4:15pm patient confirmed over the phone the new date and time

## 2012-05-08 ENCOUNTER — Telehealth: Payer: Self-pay | Admitting: *Deleted

## 2012-05-08 NOTE — Telephone Encounter (Signed)
Received vm call from pt reporting that she had been to her MD & she has a knot at the base of her head & was told that it could be a pulled muscle or a lymph node.  She reports that some test were run & was told to call the [facility].  Return call made @ 12 noon & message left to call back.

## 2012-05-09 ENCOUNTER — Telehealth: Payer: Self-pay | Admitting: *Deleted

## 2012-05-09 ENCOUNTER — Other Ambulatory Visit: Payer: Self-pay | Admitting: *Deleted

## 2012-05-09 DIAGNOSIS — C9110 Chronic lymphocytic leukemia of B-cell type not having achieved remission: Secondary | ICD-10-CM

## 2012-05-09 NOTE — Telephone Encounter (Signed)
Received call from pt. after playing telephone tag reporting that she saw her PCP/PA b/c she had been feeling tired, rundown & her weight was up & just needed to know what was wrong with her.  The PA told her that she had a knot on the right back side of her head in the hair line with swelling that could be a lymph node or a muscle pull.  She reports pain down neck & into right shoulder blade.  She reports that she has had neck pain for @ 2wks.  Suggested that she try anti inflammatory, heat or cold & see if it gets better.  Discussed with Dr. Cyndie Chime & message left for pt to come in fri. 05/11/12 @ 9am for lab & 9:30am for MD visit.  Orders to schedulers.

## 2012-05-10 ENCOUNTER — Telehealth: Payer: Self-pay | Admitting: Oncology

## 2012-05-10 ENCOUNTER — Telehealth: Payer: Self-pay | Admitting: *Deleted

## 2012-05-10 NOTE — Telephone Encounter (Signed)
Message left for pt on her mobile vm that if she was OK with cont. heat & anti-inflammatories over the weekend we will just have her call back next week & let us know how she is doing.  Dr. Cyndie Chime was OK with this plan.

## 2012-05-10 NOTE — Telephone Encounter (Signed)
Pt reports that she is unable to make 9 am appt tomorrow b/c she has no one to cover her job.  She reports that she was fine with what was discussed yest & has an appt soon.  Reminded pt that this is only a lab appt. She reports that she could come later in the day 1:30-3pm if Dr Cyndie Chime thinks she needs to come in.  She reports that she is trying some tylenol/advil & heat.  It hurts for her to turn her head to the right to look behind.  Will discuss with Dr. Cyndie Chime.

## 2012-05-10 NOTE — Telephone Encounter (Signed)
Appt cancelled for 6/7 per pt rqst

## 2012-05-11 ENCOUNTER — Ambulatory Visit: Payer: 59 | Admitting: Oncology

## 2012-05-11 ENCOUNTER — Other Ambulatory Visit: Payer: 59 | Admitting: Lab

## 2012-05-14 ENCOUNTER — Telehealth: Payer: Self-pay

## 2012-05-14 NOTE — Telephone Encounter (Signed)
Received call from pt stating she was calling with an update on how she was feeling.  Pt states she took the ibuprofen and used heat as directed, and her neck is feeling better.  Pt states her headaches are better as well.  Pt states she only has a slight headache "every now and then," but it is "not near as bad."  Pt states the "knot" is still there on the back of her neck, but does not seem to have changed in size.  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  Pt states Dr. Kathryne Sharper office called her with her labs results that were drawn there, and she was told that everything looked good, and they should have faxed this to Dr. Cyndie Chime.  Informed pt will relay this information to MD for review.

## 2012-05-15 ENCOUNTER — Telehealth: Payer: Self-pay | Admitting: *Deleted

## 2012-05-15 NOTE — Telephone Encounter (Signed)
Pt called back today to report that she doesn't have any h/a, no pain in her shoulder but still has the knot which is on right posterior neck in hairline & is @ 1" in size & is oblong.  She has been using ibuprofen & heat but stopped the ibuprofen on mon.  There is no soreness & she is feeling pretty good.  She is going on vacation next week & feels OK not coming to the office to check this unless Dr Cyndie Chime thinks she should.  She states she will call if it gets bigger or symptoms change.

## 2012-05-25 ENCOUNTER — Telehealth: Payer: Self-pay | Admitting: Oncology

## 2012-05-25 ENCOUNTER — Encounter: Payer: Self-pay | Admitting: *Deleted

## 2012-05-25 NOTE — Telephone Encounter (Signed)
called pts home lmovm that her lab appt on 06/26 was moved to 07/10 and to rtn call to confirm appt

## 2012-05-25 NOTE — Progress Notes (Signed)
Pt called stating that PCP has put pt on prednisone tapering dose for a rash that will end on Sunday.  She has a lab appt on 6/26 and wanted to see if she needed to change her lab appt.  Dr Reece Agar has moved her lab appts before when she has been on prednisone.  Per Lonna Cobb NP, we need to move out lab appt x 2 weeks.  Called and left vm on pt's machine that schedulers will call with new lab appt and the 6/26 appt will be cancelled.  SLJ

## 2012-05-29 ENCOUNTER — Other Ambulatory Visit: Payer: 59 | Admitting: Lab

## 2012-05-30 ENCOUNTER — Other Ambulatory Visit: Payer: 59 | Admitting: Lab

## 2012-06-04 ENCOUNTER — Telehealth: Payer: Self-pay | Admitting: *Deleted

## 2012-06-11 NOTE — Telephone Encounter (Signed)
No note

## 2012-06-13 ENCOUNTER — Other Ambulatory Visit: Payer: 59 | Admitting: Lab

## 2012-06-14 ENCOUNTER — Telehealth: Payer: Self-pay | Admitting: *Deleted

## 2012-06-14 NOTE — Telephone Encounter (Signed)
Pt notified per Dr. Cyndie Chime that it would be highly unlikely that her symptoms were related to her lymphoma.

## 2012-06-14 NOTE — Telephone Encounter (Signed)
Received vm call from pt stating that she is having a lot of swelling in her feet & is under the care of Dr.Mckeown & he has her on lasix 40mg  bid & just increased to tid.  She reports feeling very tired.  She also states that she got an insect bite Monday & her foot is swollen even more.  She has used cortisone cream but this hasn't helped much.  She is just asking for reasuance that she is doing the right things. Called pt back & she reports that she will start the lasix 40 mg tid today but doesn't know how much her body can take of this.  She reports fatigue & her feet are sore.  She is at work today.  She reports that her feet do go down @ 3/4 but once up, swelling returns.  She reports drinking lots of water & Dr Oneta Rack has checked her labs to check kidney function & K+.  She reports that her K+ was down some & Dr Oneta Rack told her to use lite salt.  Suggested trying to keep feet elevated as much as possible & above her heart & OK to use ice to insect bite.  She will f/u with Dr. Oneta Rack.  She just wants to know if this could be related at all to her lymphoma.  Note to Dr. Cyndie Chime.

## 2012-07-03 ENCOUNTER — Other Ambulatory Visit (HOSPITAL_BASED_OUTPATIENT_CLINIC_OR_DEPARTMENT_OTHER): Payer: 59 | Admitting: Lab

## 2012-07-03 ENCOUNTER — Telehealth: Payer: Self-pay | Admitting: *Deleted

## 2012-07-03 DIAGNOSIS — C9110 Chronic lymphocytic leukemia of B-cell type not having achieved remission: Secondary | ICD-10-CM

## 2012-07-03 LAB — CBC WITH DIFFERENTIAL/PLATELET
Eosinophils Absolute: 0.2 10*3/uL (ref 0.0–0.5)
HCT: 35.2 % (ref 34.8–46.6)
LYMPH%: 68.9 % — ABNORMAL HIGH (ref 14.0–49.7)
MONO#: 0.8 10*3/uL (ref 0.1–0.9)
NEUT#: 5.5 10*3/uL (ref 1.5–6.5)
NEUT%: 26.3 % — ABNORMAL LOW (ref 38.4–76.8)
Platelets: 140 10*3/uL — ABNORMAL LOW (ref 145–400)
WBC: 21 10*3/uL — ABNORMAL HIGH (ref 3.9–10.3)
nRBC: 0 % (ref 0–0)

## 2012-07-03 NOTE — Telephone Encounter (Signed)
Pt called & states that she is scheduled for labs today & she is on 2 ATB for a stomach or esophagus infection & also went to eye Dr. Shona Needles & is on another ATB for conjunctivitis & wonders is she should come in for labs today.  She will see Dr Cyndie Chime 07/31/12.  Informed she could r/s for 1 wk if she would like or OK to come in.

## 2012-07-05 ENCOUNTER — Telehealth: Payer: Self-pay | Admitting: *Deleted

## 2012-07-05 NOTE — Telephone Encounter (Signed)
Message left yest for return call.  She called back today & lab report given per Dr Cyndie Chime.

## 2012-07-05 NOTE — Telephone Encounter (Signed)
Message copied by Sabino Snipes on Thu Jul 05, 2012 10:50 AM ------      Message from: Levert Feinstein      Created: Tue Jul 03, 2012  7:03 PM       Call pt  WBC down from 66,000 in April to 21,000 today!

## 2012-07-17 ENCOUNTER — Telehealth: Payer: Self-pay | Admitting: Oncology

## 2012-07-17 NOTE — Telephone Encounter (Signed)
Talked to pt, gave her new apt date and time , moved from 8/27 to 9/9 per MD due to call day

## 2012-07-31 ENCOUNTER — Other Ambulatory Visit: Payer: 59

## 2012-07-31 ENCOUNTER — Ambulatory Visit: Payer: 59 | Admitting: Oncology

## 2012-08-02 ENCOUNTER — Inpatient Hospital Stay (HOSPITAL_COMMUNITY)
Admission: EM | Admit: 2012-08-02 | Discharge: 2012-08-04 | DRG: 641 | Disposition: A | Discharge status: Home / Self Care | Payer: 59 | Attending: Internal Medicine | Admitting: Internal Medicine

## 2012-08-02 ENCOUNTER — Encounter (HOSPITAL_COMMUNITY): Payer: Self-pay | Admitting: General Practice

## 2012-08-02 DIAGNOSIS — E871 Hypo-osmolality and hyponatremia: Principal | ICD-10-CM

## 2012-08-02 DIAGNOSIS — C9110 Chronic lymphocytic leukemia of B-cell type not having achieved remission: Secondary | ICD-10-CM

## 2012-08-02 DIAGNOSIS — B159 Hepatitis A without hepatic coma: Secondary | ICD-10-CM

## 2012-08-02 DIAGNOSIS — J329 Chronic sinusitis, unspecified: Secondary | ICD-10-CM

## 2012-08-02 DIAGNOSIS — E876 Hypokalemia: Secondary | ICD-10-CM

## 2012-08-02 DIAGNOSIS — R531 Weakness: Secondary | ICD-10-CM

## 2012-08-02 DIAGNOSIS — R112 Nausea with vomiting, unspecified: Secondary | ICD-10-CM

## 2012-08-02 DIAGNOSIS — R5381 Other malaise: Secondary | ICD-10-CM

## 2012-08-02 DIAGNOSIS — I10 Essential (primary) hypertension: Secondary | ICD-10-CM

## 2012-08-02 DIAGNOSIS — K219 Gastro-esophageal reflux disease without esophagitis: Secondary | ICD-10-CM | POA: Diagnosis present

## 2012-08-02 DIAGNOSIS — R5383 Other fatigue: Secondary | ICD-10-CM

## 2012-08-02 HISTORY — DX: Hepatitis a without hepatic coma: B15.9

## 2012-08-02 HISTORY — DX: Chronic sinusitis, unspecified: J32.9

## 2012-08-02 HISTORY — DX: Major depressive disorder, single episode, unspecified: F32.9

## 2012-08-02 HISTORY — DX: Pneumonia, unspecified organism: J18.9

## 2012-08-02 HISTORY — DX: Chronic lymphocytic leukemia of B-cell type not having achieved remission: C91.10

## 2012-08-02 HISTORY — DX: Depression, unspecified: F32.A

## 2012-08-02 HISTORY — DX: Gastro-esophageal reflux disease without esophagitis: K21.9

## 2012-08-02 LAB — CBC WITH DIFFERENTIAL/PLATELET
Basophils Absolute: 0 10*3/uL (ref 0.0–0.1)
Eosinophils Relative: 0 % (ref 0–5)
HCT: 36.3 % (ref 36.0–46.0)
Lymphs Abs: 34.3 10*3/uL — ABNORMAL HIGH (ref 0.7–4.0)
MCH: 31.9 pg (ref 26.0–34.0)
MCV: 86.4 fL (ref 78.0–100.0)
Monocytes Absolute: 0.8 10*3/uL (ref 0.1–1.0)
Monocytes Relative: 2 % — ABNORMAL LOW (ref 3–12)
Neutro Abs: 5.7 10*3/uL (ref 1.7–7.7)
RDW: 12.2 % (ref 11.5–15.5)
WBC: 40.8 10*3/uL — ABNORMAL HIGH (ref 4.0–10.5)

## 2012-08-02 LAB — URINALYSIS, ROUTINE W REFLEX MICROSCOPIC
Bilirubin Urine: NEGATIVE
Hgb urine dipstick: NEGATIVE
Ketones, ur: NEGATIVE mg/dL
Specific Gravity, Urine: 1.012 (ref 1.005–1.030)
pH: 7 (ref 5.0–8.0)

## 2012-08-02 LAB — CBC
Hemoglobin: 12.1 g/dL (ref 12.0–15.0)
MCHC: 36.8 g/dL — ABNORMAL HIGH (ref 30.0–36.0)
RBC: 3.78 MIL/uL — ABNORMAL LOW (ref 3.87–5.11)
WBC: 39.8 10*3/uL — ABNORMAL HIGH (ref 4.0–10.5)

## 2012-08-02 LAB — BASIC METABOLIC PANEL
BUN: 19 mg/dL (ref 6–23)
CO2: 28 mEq/L (ref 19–32)
Chloride: 74 mEq/L — ABNORMAL LOW (ref 96–112)
Creatinine, Ser: 0.9 mg/dL (ref 0.50–1.10)

## 2012-08-02 LAB — POCT I-STAT TROPONIN I

## 2012-08-02 LAB — CREATININE, SERUM
Creatinine, Ser: 0.89 mg/dL (ref 0.50–1.10)
GFR calc non Af Amer: 67 mL/min — ABNORMAL LOW (ref >90)

## 2012-08-02 LAB — MAGNESIUM: Magnesium: 1.5 mg/dL (ref 1.5–2.5)

## 2012-08-02 MED ORDER — BUTALBITAL-APAP-CAFFEINE 50-325-40 MG PO TABS
1.0000 | ORAL_TABLET | ORAL | Status: DC | PRN
  Filled 2012-08-02: qty 1

## 2012-08-02 MED ORDER — BUPROPION HCL ER (XL) 300 MG PO TB24
300.0000 mg | ORAL_TABLET | Freq: Every day | ORAL | Status: DC
  Administered 2012-08-03 – 2012-08-04 (×2): 300 mg via ORAL
  Filled 2012-08-02 (×3): qty 1

## 2012-08-02 MED ORDER — SODIUM CHLORIDE 0.9 % IJ SOLN: 3.0000 mL | Freq: Two times a day (BID) | INTRAMUSCULAR | Status: DC

## 2012-08-02 MED ORDER — SENNOSIDES-DOCUSATE SODIUM 8.6-50 MG PO TABS: 1.0000 | ORAL_TABLET | Freq: Every evening | ORAL | Status: DC | PRN

## 2012-08-02 MED ORDER — ONDANSETRON HCL 4 MG/2ML IJ SOLN
4.0000 mg | INTRAMUSCULAR | Status: AC
  Administered 2012-08-02: 4 mg via INTRAVENOUS
  Filled 2012-08-02: qty 2

## 2012-08-02 MED ORDER — ACETAMINOPHEN 325 MG PO TABS: 650.0000 mg | ORAL_TABLET | Freq: Four times a day (QID) | ORAL | Status: DC | PRN

## 2012-08-02 MED ORDER — ENOXAPARIN SODIUM 40 MG/0.4ML ~~LOC~~ SOLN
40.0000 mg | SUBCUTANEOUS | Status: DC
  Administered 2012-08-02 – 2012-08-03 (×2): 40 mg via SUBCUTANEOUS
  Filled 2012-08-02 (×3): qty 0.4

## 2012-08-02 MED ORDER — SODIUM CHLORIDE 0.9 % IV BOLUS (SEPSIS): 1000.0000 mL | Freq: Once | INTRAVENOUS | Status: DC

## 2012-08-02 MED ORDER — OXYCODONE HCL 5 MG PO TABS
5.0000 mg | ORAL_TABLET | ORAL | Status: DC | PRN
  Administered 2012-08-04: 5 mg via ORAL
  Filled 2012-08-02: qty 1

## 2012-08-02 MED ORDER — ACETAMINOPHEN 650 MG RE SUPP: 650.0000 mg | Freq: Four times a day (QID) | RECTAL | Status: DC | PRN

## 2012-08-02 MED ORDER — ONDANSETRON HCL 4 MG PO TABS: 4.0000 mg | ORAL_TABLET | Freq: Four times a day (QID) | ORAL | Status: DC | PRN

## 2012-08-02 MED ORDER — POTASSIUM CHLORIDE 20 MEQ/15ML (10%) PO LIQD
40.0000 meq | Freq: Once | ORAL | Status: AC
  Administered 2012-08-02: 40 meq via ORAL
  Filled 2012-08-02: qty 30

## 2012-08-02 MED ORDER — SODIUM CHLORIDE 0.9 % IV SOLN: 250.0000 mL | INTRAVENOUS | Status: DC | PRN

## 2012-08-02 MED ORDER — EZETIMIBE 10 MG PO TABS
10.0000 mg | ORAL_TABLET | Freq: Every day | ORAL | Status: DC
  Administered 2012-08-02 – 2012-08-03 (×2): 10 mg via ORAL
  Filled 2012-08-02 (×3): qty 1

## 2012-08-02 MED ORDER — ALPRAZOLAM 0.25 MG PO TABS
0.2500 mg | ORAL_TABLET | Freq: Every evening | ORAL | Status: DC | PRN
  Administered 2012-08-02: 0.25 mg via ORAL
  Filled 2012-08-02: qty 1

## 2012-08-02 MED ORDER — NEBIVOLOL HCL 10 MG PO TABS
20.0000 mg | ORAL_TABLET | Freq: Every day | ORAL | Status: DC
  Administered 2012-08-03 – 2012-08-04 (×2): 20 mg via ORAL
  Filled 2012-08-02 (×3): qty 2

## 2012-08-02 MED ORDER — NEBIVOLOL HCL 20 MG PO TABS: 20.0000 mg | ORAL_TABLET | Freq: Every day | ORAL | Status: DC

## 2012-08-02 MED ORDER — LORATADINE 10 MG PO TABS
10.0000 mg | ORAL_TABLET | Freq: Every day | ORAL | Status: DC
  Administered 2012-08-03 – 2012-08-04 (×2): 10 mg via ORAL
  Filled 2012-08-02 (×3): qty 1

## 2012-08-02 MED ORDER — SODIUM CHLORIDE 0.9 % IV SOLN
INTRAVENOUS | Status: DC
  Administered 2012-08-02 – 2012-08-03 (×2): via INTRAVENOUS

## 2012-08-02 MED ORDER — ONDANSETRON HCL 4 MG/2ML IJ SOLN: 4.0000 mg | Freq: Four times a day (QID) | INTRAMUSCULAR | Status: DC | PRN

## 2012-08-02 MED ORDER — SODIUM CHLORIDE 0.9 % IV SOLN
1000.0000 mL | Freq: Once | INTRAVENOUS | Status: AC
  Administered 2012-08-02: 1000 mL via INTRAVENOUS

## 2012-08-02 MED ORDER — SODIUM CHLORIDE 0.9 % IV SOLN: 1000.0000 mL | INTRAVENOUS | Status: DC

## 2012-08-02 MED ORDER — PANTOPRAZOLE SODIUM 40 MG PO TBEC
40.0000 mg | DELAYED_RELEASE_TABLET | Freq: Every day | ORAL | Status: DC
  Administered 2012-08-03 – 2012-08-04 (×2): 40 mg via ORAL
  Filled 2012-08-02 (×2): qty 1

## 2012-08-02 MED ORDER — SODIUM CHLORIDE 0.9 % IJ SOLN: 3.0000 mL | INTRAMUSCULAR | Status: DC | PRN

## 2012-08-02 MED ORDER — MAGNESIUM SULFATE 40 MG/ML IJ SOLN
2.0000 g | Freq: Once | INTRAMUSCULAR | Status: AC
  Administered 2012-08-02: 2 g via INTRAVENOUS
  Filled 2012-08-02: qty 50

## 2012-08-02 MED ORDER — POTASSIUM CHLORIDE CRYS ER 20 MEQ PO TBCR
40.0000 meq | EXTENDED_RELEASE_TABLET | ORAL | Status: DC
  Administered 2012-08-02 – 2012-08-03 (×3): 40 meq via ORAL
  Filled 2012-08-02 (×4): qty 2

## 2012-08-02 NOTE — H&P (Signed)
[facility]          History and Physical    PCP:   Nadean Corwin, MD   Chief Complaint:  Weakness, abnormal lab values.  HPI: Patient is a pleasant 66 year old white woman with past medical history significant for hypertension, CLL, recent diagnosis of hepatitis A who comes to the hospital today as a request from her PCP after reviewing blood work drawn yesterday. It appears patient has been having nausea and vomiting for about 3 weeks in addition to lower extremity edema. She was finally diagnosed with hepatitis A and her symptoms were thought to be secondary to this. She was started on 80 mg of Lasix and 5 mg of Zaroxolyn for her swelling 3 weeks ago. Yesterday she revisited her physician because of extreme weakness, blood work was obtained and she was sent home. She was called this morning and asked to come to the hospital after blood work showed a sodium of 114, potassium of 2.8 and a magnesium of 1.4. We are asked to admit her for further evaluation and management.  Allergies:   Allergies  Allergen Reactions  . Sudafed (Pseudoephedrine Hcl) Palpitations  . Hyzaar (Losartan Potassium-Hctz) Other (See Comments)    unknown  . Iohexol      Code: HIVES, Desc: pt states she broke out in hive 20 yrs ago from iv contrast.  she also is refusing (1/14/9) to take benadryl or prednisone for premeds because of the anxiety effect they cause her.  done w/o on 1/14/9 @ [facility] Onset Date: [id number]       Past Medical History  Diagnosis Date  . Benign essential HTN 11/22/2011    No past surgical history on file.  Prior to Admission medications   Medication Sig Start Date End Date Taking? Authorizing Provider  ALPRAZolam Prudy Feeler) 0.5 MG tablet Take 0.25 mg by mouth daily as needed. For anxiety   Yes Historical Provider, MD  buPROPion (WELLBUTRIN XL) 300 MG 24 hr tablet Take 300 mg by mouth Daily. 11/15/11  Yes Historical Provider, MD  butalbital-acetaminophen-caffeine  (FIORICET, ESGIC) 50-325-40 MG per tablet Take 1 tablet by mouth every 4 (four) hours as needed. For headache   Yes Historical Provider, MD  BYSTOLIC 20 MG TABS Take 20 mg by mouth Daily. 11/03/11  Yes Historical Provider, MD  esomeprazole (NEXIUM) 40 MG capsule Take 40 mg by mouth daily as needed. For acid reflux   Yes Historical Provider, MD  ezetimibe (ZETIA) 10 MG tablet Take 10 mg by mouth at bedtime.   Yes Historical Provider, MD  fexofenadine (ALLEGRA) 180 MG tablet Take 180 mg by mouth daily.   Yes Historical Provider, MD  metolazone (ZAROXOLYN) 5 MG tablet Take 5 mg by mouth every other day.   Yes Historical Provider, MD  Probiotic Product (ALIGN) 4 MG CAPS Take 4 mg by mouth daily.   Yes Historical Provider, MD    Social History:  does not have a smoking history on file. She does not have any smokeless tobacco history on file. Her alcohol and drug histories not on file.  No family history on file.  Review of Systems:  Constitutional: Denies fever, chills, diaphoresis, appetite change and fatigue.  HEENT: Denies photophobia, eye pain, redness, hearing loss, ear pain, congestion, sore throat, rhinorrhea, sneezing, mouth sores, trouble swallowing, neck pain, neck stiffness and tinnitus.   Respiratory: Denies SOB, DOE, cough, chest tightness,  and wheezing.   Cardiovascular: Denies chest pain, palpitations and leg swelling.  Gastrointestinal: Denies nausea, vomiting,  abdominal pain, diarrhea, constipation, blood in stool and abdominal distention.  Genitourinary: Denies dysuria, urgency, frequency, hematuria, flank pain and difficulty urinating.  Musculoskeletal: Denies  back pain, joint swelling, arthralgias.  Skin: Denies pallor, rash and wound.  Neurological: Denies dizziness, seizures, syncope, light-headedness, numbness and headaches.  Hematological: Denies adenopathy. Easy bruising, personal or family bleeding history  Psychiatric/Behavioral: Denies suicidal ideation, mood  changes, confusion, nervousness, sleep disturbance and agitation   Physical Exam: Blood pressure 105/54, pulse 71, temperature 98.1 F (36.7 C), resp. rate 16, SpO2 96.00%. General: Alert, awake, oriented x3, in no distress. HEENT: Normocephalic, atraumatic, wears corrective lenses, dry mucous membranes. Neck: Supple, no JVD, no lymphadenopathy, no bruits, no goiter. Cardiovascular: Regular rate and rhythm, no murmurs, rubs or gallops. Respiratory: Clear to auscultation bilaterally. Abdomen: Soft, nontender, nondistended, positive bowel sounds, no masses organomegaly noted. Extremities: 1+ pitting edema to about midshin bilaterally, positive pedal pulses. Neurologic: Grossly intact and nonfocal although I have not ambulated her. Skin: No rashes seen  Labs on Admission:  Results for orders placed during the hospital encounter of 08/02/12 (from the past 48 hour(s))  BASIC METABOLIC PANEL     Status: Abnormal   Collection Time   08/02/12 11:30 AM      Component Value Range Comment   Sodium 116 (*) 135 - 145 mEq/L    Potassium 2.9 (*) 3.5 - 5.1 mEq/L    Chloride 74 (*) 96 - 112 mEq/L    CO2 28  19 - 32 mEq/L    Glucose, Bld 124 (*) 70 - 99 mg/dL    BUN 19  6 - 23 mg/dL    Creatinine, Ser 1.61  0.50 - 1.10 mg/dL    Calcium 09.6  8.4 - 10.5 mg/dL    GFR calc non Af Amer 66 (*) >90 mL/min    GFR calc Af Amer 76 (*) >90 mL/min   CBC WITH DIFFERENTIAL     Status: Abnormal   Collection Time   08/02/12 12:56 PM      Component Value Range Comment   WBC 40.8 (*) 4.0 - 10.5 K/uL    RBC 4.20  3.87 - 5.11 MIL/uL    Hemoglobin 13.4  12.0 - 15.0 g/dL    HCT 04.5  40.9 - 81.1 %    MCV 86.4  78.0 - 100.0 fL    MCH 31.9  26.0 - 34.0 pg    MCHC 36.9 (*) 30.0 - 36.0 g/dL    RDW 91.4  78.2 - 95.6 %    Platelets 172  150 - 400 K/uL    Neutrophils Relative 14 (*) 43 - 77 %    Lymphocytes Relative 84 (*) 12 - 46 %    Monocytes Relative 2 (*) 3 - 12 %    Eosinophils Relative 0  0 - 5 %     Basophils Relative 0  0 - 1 %    Neutro Abs 5.7  1.7 - 7.7 K/uL    Lymphs Abs 34.3 (*) 0.7 - 4.0 K/uL    Monocytes Absolute 0.8  0.1 - 1.0 K/uL    Eosinophils Absolute 0.0  0.0 - 0.7 K/uL    Basophils Absolute 0.0  0.0 - 0.1 K/uL    WBC Morphology ABSOLUTE LYMPHOCYTOSIS     URINALYSIS, ROUTINE W REFLEX MICROSCOPIC     Status: Normal   Collection Time   08/02/12  4:00 PM      Component Value Range Comment   Color, Urine YELLOW  YELLOW  APPearance CLEAR  CLEAR    Specific Gravity, Urine 1.012  1.005 - 1.030    pH 7.0  5.0 - 8.0    Glucose, UA NEGATIVE  NEGATIVE mg/dL    Hgb urine dipstick NEGATIVE  NEGATIVE    Bilirubin Urine NEGATIVE  NEGATIVE    Ketones, ur NEGATIVE  NEGATIVE mg/dL    Protein, ur NEGATIVE  NEGATIVE mg/dL    Urobilinogen, UA 0.2  0.0 - 1.0 mg/dL    Nitrite NEGATIVE  NEGATIVE    Leukocytes, UA NEGATIVE  NEGATIVE MICROSCOPIC NOT DONE ON URINES WITH NEGATIVE PROTEIN, BLOOD, LEUKOCYTES, NITRITE, OR GLUCOSE <1000 mg/dL.  POCT I-STAT TROPONIN I     Status: Normal   Collection Time   08/02/12  4:27 PM      Component Value Range Comment   Troponin i, poc 0.01  0.00 - 0.08 ng/mL    Comment 3              Radiological Exams on Admission: No results found.  Assessment/Plan Principal Problem:  *Hyponatremia Active Problems:  CLL (chronic lymphocytic leukemia)  Hypokalemia  Weakness generalized  Nausea & vomiting  Hepatitis A  Hypomagnesemia   #1 generalized weakness: Secondary to electrolyte abnormalities. See below for details. We'll request PT evaluation.  #2 hyponatremia/hypokalemia/hypomagnesemia: Related to diuretic use on top of significant GI losses related to recent diagnosis of Hepatitis A. Discontinue diuretics. We'll admit to telemetry given her significant hypokalemia. Start her on IV fluids, as well as potassium and magnesium supplementation. We'll recheck electrolytes in the morning.  #3 CLL: Followed by Dr. Cyndie Chime. Not presently undergoing  treatment. Has a white count of 40,000. Review of records indicate leukocytosis that ranges anywhere from 20,000-80,000.  #4 DVT prophylaxis: Lovenox.  #5 disposition: Consider discharge home in one to 2 days once electrolytes have been repleted.  Time Spent on Admission: 50 minutes.  Chaya Jan [facility] Pager: [phone] 08/02/2012, 5:14 PM

## 2012-08-02 NOTE — ED Notes (Signed)
Attempted to call report - nurse unavailable at this time - will call me back

## 2012-08-02 NOTE — ED Provider Notes (Signed)
History     CSN: [id number]  Arrival date & time 08/02/12  1032   First MD Initiated Contact with Patient 08/02/12 1357      Chief Complaint  Patient presents with  . Weakness    (Consider location/radiation/quality/duration/timing/severity/associated sxs/prior treatment) HPI Comments: Patient presents with weakness for the past 3 days. She reports gradual onset of weakness which she eventually went to her doctor for. Her doctor ordered labs which showed abnormal electrolytes and told her to go to the ED. She has a significant medical history for CLL and current Hepatitis A infection. She reports associated abdominal pain, headache, and nausea. She denies vomiting, diarrhea, and recent illness.   Patient is a 66 y.o. female presenting with weakness.  Weakness The primary symptoms include nausea. Primary symptoms do not include dizziness, fever or vomiting.  Additional symptoms include weakness. Additional symptoms do not include photophobia.    Past Medical History  Diagnosis Date  . Benign essential HTN 11/22/2011    No past surgical history on file.  No family history on file.  History  Substance Use Topics  . Smoking status: Not on file  . Smokeless tobacco: Not on file  . Alcohol Use: Not on file    OB History    Grav Para Term Preterm Abortions TAB SAB Ect Mult Living                  Review of Systems  Constitutional: Positive for fatigue. Negative for fever, chills and diaphoresis.  Eyes: Negative for photophobia and visual disturbance.  Respiratory: Negative for shortness of breath.   Cardiovascular: Negative for chest pain.  Gastrointestinal: Positive for nausea and abdominal pain. Negative for vomiting and diarrhea.  Genitourinary: Negative for dysuria.  Musculoskeletal: Positive for myalgias. Negative for back pain.  Skin: Negative for rash and wound.  Neurological: Positive for weakness. Negative for dizziness, light-headedness and numbness.     Allergies  Sudafed; Hyzaar; and Iohexol  Home Medications   Current Outpatient Rx  Name Route Sig Dispense Refill  . ALPRAZOLAM 0.5 MG PO TABS Oral Take 0.25 mg by mouth daily as needed. For anxiety    . BUPROPION HCL ER (XL) 300 MG PO TB24 Oral Take 300 mg by mouth Daily.    [name] Kitchen BUTALBITAL-APAP-CAFFEINE 50-325-40 MG PO TABS Oral Take 1 tablet by mouth every 4 (four) hours as needed. For headache    . BYSTOLIC 20 MG PO TABS Oral Take 20 mg by mouth Daily.    [name] Kitchen ESOMEPRAZOLE MAGNESIUM 40 MG PO CPDR Oral Take 40 mg by mouth daily as needed. For acid reflux    . EZETIMIBE 10 MG PO TABS Oral Take 10 mg by mouth at bedtime.    [name] Kitchen FEXOFENADINE HCL 180 MG PO TABS Oral Take 180 mg by mouth daily.    [name] Kitchen METOLAZONE 5 MG PO TABS Oral Take 5 mg by mouth every other day.    [name] Kitchen ALIGN 4 MG PO CAPS Oral Take 4 mg by mouth daily.      BP 132/71  Pulse 70  Temp 98.1 F (36.7 C)  Resp 16  SpO2 99%  Physical Exam  Nursing note and vitals reviewed. Constitutional: She is oriented to person, place, and time. She appears well-developed and well-nourished. No distress.  HENT:  Head: Normocephalic and atraumatic.  Mouth/Throat: No oropharyngeal exudate.  Eyes: Conjunctivae are normal. No scleral icterus.  Neck: Normal range of motion. Neck supple.  Cardiovascular: Normal rate and regular rhythm.  Exam  reveals no gallop and no friction rub.   No murmur heard.      No pedal edema noted.   Pulmonary/Chest: Effort normal and breath sounds normal. She has no wheezes. She has no rales.  Abdominal: Soft. There is no tenderness.  Musculoskeletal: Normal range of motion.  Neurological: She is alert and oriented to person, place, and time. No cranial nerve deficit.  Skin: Skin is warm and dry. She is not diaphoretic.  Psychiatric: She has a normal mood and affect. Her behavior is normal.    ED Course  Procedures (including critical care time)  Labs Reviewed  BASIC METABOLIC PANEL - Abnormal; Notable for  the following:    Sodium 116 (*)     Potassium 2.9 (*)     Chloride 74 (*)     Glucose, Bld 124 (*)     GFR calc non Af Amer 66 (*)     GFR calc Af Amer 76 (*)     All other components within normal limits  CBC WITH DIFFERENTIAL - Abnormal; Notable for the following:    WBC 40.8 (*)     MCHC 36.9 (*)     Neutrophils Relative 14 (*)     Lymphocytes Relative 84 (*)     Monocytes Relative 2 (*)     Lymphs Abs 34.3 (*)     All other components within normal limits  URINALYSIS, ROUTINE W REFLEX MICROSCOPIC   No results found.   No diagnosis found.    MDM  3:22 PM Labs show significant abnormalities most likely due to Lasix use and CLL. Patient currently feels weak, nauseas, and has a headache. I have started fluids, potassium, and zofran.  4:07 PM Patient signed out to Dr. Ranae Palms who will consult for admission.      Emilia Beck, PA-C 08/02/12 1717

## 2012-08-02 NOTE — ED Notes (Signed)
Feet swelling and went to dr yesterday and had labs drawn and was called this am telling her labs were low .na and K  Low. States just dx w/ hep A 3 weeks ago

## 2012-08-02 NOTE — ED Notes (Signed)
Sodium 116  

## 2012-08-02 NOTE — ED Notes (Signed)
Lab called to redraw CBC with diff

## 2012-08-03 LAB — BASIC METABOLIC PANEL
BUN: 14 mg/dL (ref 6–23)
GFR calc Af Amer: >90 mL/min (ref >90)
GFR calc non Af Amer: 86 mL/min — ABNORMAL LOW (ref >90)
Potassium: 3.5 mEq/L (ref 3.5–5.1)
Sodium: 123 mEq/L — ABNORMAL LOW (ref 135–145)

## 2012-08-03 LAB — CBC
MCH: 32.1 pg (ref 26.0–34.0)
Platelets: 155 10*3/uL (ref 150–400)
RBC: 3.71 MIL/uL — ABNORMAL LOW (ref 3.87–5.11)

## 2012-08-03 MED ORDER — POTASSIUM CHLORIDE CRYS ER 20 MEQ PO TBCR
20.0000 meq | EXTENDED_RELEASE_TABLET | Freq: Once | ORAL | Status: AC
  Administered 2012-08-03: 20 meq via ORAL

## 2012-08-03 MED ORDER — ALPRAZOLAM 0.5 MG PO TABS
0.5000 mg | ORAL_TABLET | Freq: Every evening | ORAL | Status: DC | PRN
  Administered 2012-08-03: 0.5 mg via ORAL
  Filled 2012-08-03: qty 1

## 2012-08-03 MED ORDER — SODIUM CHLORIDE 0.9 % IV SOLN
INTRAVENOUS | Status: DC
  Administered 2012-08-03 – 2012-08-04 (×2): via INTRAVENOUS
  Filled 2012-08-03 (×5): qty 1000

## 2012-08-03 MED ORDER — ALPRAZOLAM 0.25 MG PO TABS
0.2500 mg | ORAL_TABLET | Freq: Three times a day (TID) | ORAL | Status: DC | PRN
  Administered 2012-08-04: 0.25 mg via ORAL
  Filled 2012-08-03: qty 1

## 2012-08-03 NOTE — Progress Notes (Signed)
Initial review for inpatient status is complete. 

## 2012-08-03 NOTE — Progress Notes (Signed)
PATIENT DETAILS Name: Rebecca Bond Age: 66 y.o. Sex: female Date of Birth: [birth year] Admit Date: 08/02/2012 Admitting Physician Henderson Cloud, MD AVW:[id number] DAVID, MD  Subjective: Feels much better  Assessment/Plan: Principal Problem:  *Hyponatremia -2/2 to GI loss, diuretics-and excessive free water intake -Na much better with IVF -recheck lytes in am  Active Problems:  CLL (chronic lymphocytic leukemia) -outpatient Onc follow up   Hypokalemia -2/2 to GI loss -repleted, K back to normal   Hypomagnesemia -2/2 to GI loss -repleted and now back to normal   Weakness generalized -2/2 to hyponatremia -ambulate with assistance -await PT eval   Nausea & vomiting -resolved -?was 2/2 to Acute Hep A   Hepatitis A -outpatient follow up with primary MD -Will check LFT's in am  Disposition: Remain inpatient-potential d/c 8/31  DVT Prophylaxis: Prophylactic Lovenox   Code Status: Full code   Procedures:  None  CONSULTS:  None  PHYSICAL EXAM: Vital signs in last 24 hours: Filed Vitals:   08/02/12 1628 08/02/12 1831 08/02/12 2146 08/03/12 0449  BP: 105/54 128/46 140/75 126/74  Pulse: 71 63 67 65  Temp:  97.8 F (36.6 C) 98.4 F (36.9 C) 97.9 F (36.6 C)  TempSrc:   Oral Oral  Resp:   16   Height:  5\' 3"  (1.6 m)    Weight:  87.1 kg (192 lb 0.3 oz)    SpO2: 96% 100% 94% 97%    Weight change:  Body mass index is 34.01 kg/(m^2).   Gen Exam: Awake and alert with clear speech.   Neck: Supple, No JVD.   Chest: B/L Clear.   CVS: S1 S2 Regular, no murmurs.  Abdomen: soft, BS +, non tender, non distended.  Extremities: no edema, lower extremities warm to touch. Neurologic: Non Focal.  Skin: No Rash.   Wounds: N/A.    Intake/Output from previous day:  Intake/Output Summary (Last 24 hours) at 08/03/12 1313 Last data filed at 08/03/12 0446  Gross per 24 hour  Intake   1295 ml  Output      0 ml  Net   1295 ml     LAB  RESULTS: CBC  Lab 08/03/12 0550 08/02/12 2119 08/02/12 1256  WBC 35.7* 39.8* 40.8*  HGB 11.9* 12.1 13.4  HCT 32.9* 32.9* 36.3  PLT 155 173 172  MCV 88.7 87.0 86.4  MCH 32.1 32.0 31.9  MCHC 36.2* 36.8* 36.9*  RDW 12.5 12.3 12.2  LYMPHSABS -- -- 34.3*  MONOABS -- -- 0.8  EOSABS -- -- 0.0  BASOSABS -- -- 0.0  BANDABS -- -- --    Chemistries   Lab 08/03/12 0550 08/02/12 2119 08/02/12 1130  NA 123* -- 116*  K 3.5 -- 2.9*  CL 83* -- 74*  CO2 25 -- 28  GLUCOSE 129* -- 124*  BUN 14 -- 19  CREATININE 0.79 0.89 0.90  CALCIUM 9.3 -- 10.2  MG 2.2 1.5 --    CBG: No results found for this basename: GLUCAP:5 in the last 168 hours  GFR Estimated Creatinine Clearance: 73.4 ml/min (by C-G formula based on Cr of 0.79).  Coagulation profile No results found for this basename: INR:5,PROTIME:5 in the last 168 hours  Cardiac Enzymes No results found for this basename: CK:3,CKMB:3,TROPONINI:3,MYOGLOBIN:3 in the last 168 hours  No components found with this basename: POCBNP:3 No results found for this basename: DDIMER:2 in the last 72 hours No results found for this basename: HGBA1C:2 in the last 72 hours No results found for  this basename: CHOL:2,HDL:2,LDLCALC:2,TRIG:2,CHOLHDL:2,LDLDIRECT:2 in the last 72 hours No results found for this basename: TSH,T4TOTAL,FREET3,T3FREE,THYROIDAB in the last 72 hours No results found for this basename: VITAMINB12:2,FOLATE:2,FERRITIN:2,TIBC:2,IRON:2,RETICCTPCT:2 in the last 72 hours No results found for this basename: LIPASE:2,AMYLASE:2 in the last 72 hours  Urine Studies No results found for this basename: UACOL:2,UAPR:2,USPG:2,UPH:2,UTP:2,UGL:2,UKET:2,UBIL:2,UHGB:2,UNIT:2,UROB:2,ULEU:2,UEPI:2,UWBC:2,URBC:2,UBAC:2,CAST:2,CRYS:2,UCOM:2,BILUA:2 in the last 72 hours  MICROBIOLOGY: No results found for this or any previous visit (from the past 240 hour(s)).  RADIOLOGY STUDIES/RESULTS: No results found.  MEDICATIONS: Scheduled Meds:   . sodium  chloride  1,000 mL Intravenous Once  . buPROPion  300 mg Oral Daily  . enoxaparin (LOVENOX) injection  40 mg Subcutaneous Q24H  . ezetimibe  10 mg Oral QHS  . loratadine  10 mg Oral Daily  . magnesium sulfate 1 - 4 g bolus IVPB  2 g Intravenous Once  . nebivolol  20 mg Oral Daily  . ondansetron (ZOFRAN) IV  4 mg Intravenous STAT  . pantoprazole  40 mg Oral Daily  . potassium chloride  40 mEq Oral Once  . potassium chloride  20 mEq Oral Once  . sodium chloride  3 mL Intravenous Q12H  . sodium chloride  3 mL Intravenous Q12H  . sodium chloride  3 mL Intravenous Q12H  . DISCONTD: Nebivolol HCl  20 mg Oral Daily  . DISCONTD: potassium chloride  40 mEq Oral Q4H  . DISCONTD: sodium chloride  1,000 mL Intravenous Once   Continuous Infusions:   . sodium chloride 0.9 % 1,000 mL with potassium chloride 20 mEq infusion 75 mL/hr at 08/03/12 1126  . DISCONTD: sodium chloride    . DISCONTD: sodium chloride Stopped (08/03/12 1013)   PRN Meds:.sodium chloride, acetaminophen, acetaminophen, acetaminophen, acetaminophen, ALPRAZolam, ALPRAZolam, butalbital-acetaminophen-caffeine, ondansetron (ZOFRAN) IV, ondansetron (ZOFRAN) IV, ondansetron, ondansetron, oxyCODONE, senna-docusate, sodium chloride, DISCONTD: ALPRAZolam  Antibiotics: Anti-infectives    None       Jeoffrey Massed, MD  [facility] Pager:[phone]  If 7PM-7AM, please contact night-coverage [email or web address] Password [email or web address] 08/03/2012, 1:13 PM   LOS: 1 day

## 2012-08-03 NOTE — ED Provider Notes (Signed)
Medical screening examination/treatment/procedure(s) were conducted as a shared visit with non-physician practitioner(s) and myself.  I personally evaluated the patient during the encounter   Avianah Pellman, MD 08/03/12 0725 

## 2012-08-04 LAB — COMPREHENSIVE METABOLIC PANEL
ALT: 62 U/L — ABNORMAL HIGH (ref 0–35)
Alkaline Phosphatase: 58 U/L (ref 39–117)
BUN: 8 mg/dL (ref 6–23)
Chloride: 103 mEq/L (ref 96–112)
GFR calc Af Amer: >90 mL/min (ref >90)
Glucose, Bld: 111 mg/dL — ABNORMAL HIGH (ref 70–99)
Potassium: 4.5 mEq/L (ref 3.5–5.1)
Sodium: 135 mEq/L (ref 135–145)
Total Bilirubin: 0.2 mg/dL — ABNORMAL LOW (ref 0.3–1.2)

## 2012-08-04 NOTE — Progress Notes (Signed)
Received order for PT, chart reviewed and spoke with patient.  Patient reports she has been walking up and down the hallway and does not need PT.  Patient politely deferred PT evaluation.  Will sign off per patient request. 08/04/2012 Olivia Canter, PT [phone]

## 2012-08-04 NOTE — Progress Notes (Signed)
Rebecca Bond to be D/C'd Home per MD order.  Discussed with the patient and all questions fully answered.   [name], [name]  Home Medication Instructions ZOX:[id number]   Printed on:08/04/12 1430  Medication Information                    buPROPion (WELLBUTRIN XL) 300 MG 24 hr tablet Take 300 mg by mouth Daily.           BYSTOLIC 20 MG TABS Take 20 mg by mouth Daily.           esomeprazole (NEXIUM) 40 MG capsule Take 40 mg by mouth daily as needed. For acid reflux           Probiotic Product (ALIGN) 4 MG CAPS Take 4 mg by mouth daily.           fexofenadine (ALLEGRA) 180 MG tablet Take 180 mg by mouth daily.           butalbital-acetaminophen-caffeine (FIORICET, ESGIC) 50-325-40 MG per tablet Take 1 tablet by mouth every 4 (four) hours as needed. For headache           ALPRAZolam (XANAX) 0.5 MG tablet Take 0.25 mg by mouth daily as needed. For anxiety           ezetimibe (ZETIA) 10 MG tablet Take 10 mg by mouth at bedtime.             VVS, Skin clean, dry and intact without evidence of skin break down, no evidence of skin tears noted. IV catheter discontinued intact. Site without signs and symptoms of complications. Dressing and pressure applied.  An After Visit Summary was printed and given to the patient. Follow up appointments , new prescriptions and medication administration times given Patient escorted via WC, and D/C home via private auto.  Cindra Eves, RN 08/04/2012 2:30 PM

## 2012-08-04 NOTE — Discharge Summary (Signed)
PATIENT DETAILS Name: Rebecca Bond Age: 66 y.o. Sex: female Date of Birth: 02-Jan-1946 MRN: [id number]. Admit Date: 08/02/2012 Admitting Physician: Rebecca Cloud, MD Rebecca Bond,Rebecca DAVID, MD  Recommendations for Outpatient Follow-up:  Consider repeating electrolytes and LFTs next week  PRIMARY DISCHARGE DIAGNOSIS:  Principal Problem:  *Hyponatremia Active Problems:  CLL (chronic lymphocytic leukemia)  Hypokalemia  Weakness generalized  Nausea & vomiting  Hepatitis A  Hypomagnesemia      PAST MEDICAL HISTORY: Past Medical History  Diagnosis Date  . Benign essential HTN 11/22/2011  . CLL (chronic lymphocytic leukemia)   . Pneumonia 01/2010  . History of bronchitis 08/02/2012    "maybe once/yr if that much; last time 01/2010"  . Depression   . Recurrent sinus infections 08/02/2012  . 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 High cholesterol   . GERD (gastroesophageal reflux disease)     DISCHARGE MEDICATIONS: Medication List  As of 08/04/2012  2:33 PM   STOP taking these medications         metolazone 5 MG tablet         TAKE these medications         ALIGN 4 MG Caps   Take 4 mg by mouth daily.      ALPRAZolam 0.5 MG tablet   Commonly known as: XANAX   Take 0.25 mg by mouth daily as needed. For anxiety      buPROPion 300 MG 24 hr tablet   Commonly known as: WELLBUTRIN XL   Take 300 mg by mouth Daily.      butalbital-acetaminophen-caffeine 50-325-40 MG per tablet   Commonly known as: FIORICET, ESGIC   Take 1 tablet by mouth every 4 (four) hours as needed. For headache      BYSTOLIC 20 MG Tabs   Generic drug: Nebivolol HCl   Take 20 mg by mouth Daily.      esomeprazole 40 MG capsule   Commonly known as: NEXIUM   Take 40 mg by mouth daily as needed. For acid reflux      ezetimibe 10 MG tablet   Commonly known as: ZETIA   Take 10 mg by mouth at bedtime.      fexofenadine 180 MG tablet   Commonly known as: ALLEGRA   Take 180 mg by mouth  daily.             BRIEF HPI:  See H&P, Labs, Consult and Test reports for all details in brief, patient was admitted for generalized weakness and hyponatremia.  CONSULTATIONS:   None  PERTINENT RADIOLOGIC STUDIES: No results found.   PERTINENT LAB RESULTS: CBC:  Basename 08/03/12 0550 08/02/12 2119  WBC 35.7* 39.8*  HGB 11.9* 12.1  HCT 32.9* 32.9*  PLT 155 173   CMET CMP     Component Value Date/Time   NA 135 08/04/2012 1131   K 4.5 08/04/2012 1131   CL 103 08/04/2012 1131   CO2 24 08/04/2012 1131   GLUCOSE 111* 08/04/2012 1131   BUN 8 08/04/2012 1131   CREATININE 0.70 08/04/2012 1131   CALCIUM 9.2 08/04/2012 1131   PROT 6.1 08/04/2012 1131   ALBUMIN 3.5 08/04/2012 1131   AST 70* 08/04/2012 1131   ALT 62* 08/04/2012 1131   ALKPHOS 58 08/04/2012 1131   BILITOT 0.2* 08/04/2012 1131   GFRNONAA 89* 08/04/2012 1131   GFRAA >90 08/04/2012 1131    GFR Estimated Creatinine Clearance: 73.4 ml/min (by C-G formula based on Cr of 0.7). No results found for this  basename: LIPASE:2,AMYLASE:2 in the last 72 hours No results found for this basename: CKTOTAL:3,CKMB:3,CKMBINDEX:3,TROPONINI:3 in the last 72 hours No components found with this basename: POCBNP:3 No results found for this basename: DDIMER:2 in the last 72 hours No results found for this basename: HGBA1C:2 in the last 72 hours No results found for this basename: CHOL:2,HDL:2,LDLCALC:2,TRIG:2,CHOLHDL:2,LDLDIRECT:2 in the last 72 hours No results found for this basename: TSH,T4TOTAL,FREET3,T3FREE,THYROIDAB in the last 72 hours No results found for this basename: VITAMINB12:2,FOLATE:2,FERRITIN:2,TIBC:2,IRON:2,RETICCTPCT:2 in the last 72 hours Coags: No results found for this basename: PT:2,INR:2 in the last 72 hours Microbiology: No results found for this or any previous visit (from the past 240 hour(s)).   BRIEF HOSPITAL COURSE:   Principal Problem:  *Hyponatremia -This is multifactorial secondary to nausea, vomiting and  diarrhea along with the use of diuretics and excessive free water intake. On admission all her diuretics was stopped, she was hydrated. Her sodium the next day was 123, and by the day of discharge it was 135. -She clinically feels a lot better, is ambulating independently, and is anxious to be discharged home today.  Active Problems:  CLL (chronic lymphocytic leukemia) -She has a appointment next week with her primary oncologist, she has been encouraged to keep this appointment.   Hypokalemia -Secondary to GI loss and diuretics, this was repeated and subsequently normalized   Weakness generalized -This is secondary to hyponatremia. This is significantly better with resolution of her electrolyte abnormalities   Nausea & vomiting -This has resolved   Hepatitis A - recent history of hepatitis A, mild LFT elevation persists, recommend outpatient LFT monitoring.   Hypomagnesemia  -Secondary to GI loss and diuretic therapy. This normalized after repletion.   TODAY-DAY OF DISCHARGE:  Subjective:   Rebecca Bond today has no headache,no chest abdominal pain,no new weakness tingling or numbness, feels much better wants to go home today.   Objective:   Blood pressure 135/67, pulse 67, temperature 97.6 F (36.4 C), temperature source Oral, resp. rate 20, height 5\' 3"  (1.6 m), weight 87.1 kg (192 lb 0.3 oz), SpO2 97.00%.  Intake/Output Summary (Last 24 hours) at 08/04/12 1433 Last data filed at 08/04/12 0900  Gross per 24 hour  Intake 2017.5 ml  Output      0 ml  Net 2017.5 ml    Exam Awake Alert, Oriented *3, No new F.N deficits, Normal affect [name].AT,PERRAL Supple Neck,No JVD, No cervical lymphadenopathy appriciated.  Symmetrical Chest wall movement, Good air movement bilaterally, CTAB RRR,No Gallops,Rubs or new Murmurs, No Parasternal Heave +ve B.Sounds, Abd Soft, Non tender, No organomegaly appriciated, No rebound -guarding or rigidity. No Cyanosis, Clubbing or edema, No new  Rash or bruise  DISCHARGE CONDITION: Stable  DISPOSITION:  HOME  DISCHARGE INSTRUCTIONS:    Activity:  As tolerated  Diet recommendation: Heart Healthy diet  Follow-up Information    Follow up with Rebecca Corwin, MD on 08/07/2012. (keep existing appt)    Contact information:   [address] [place] [address] [phone]       Follow up with Levert Feinstein, MD. (keep next appt)    Contact information:   [address] [place] [address] [phone]         Total Time spent on discharge equals 45 minutes.  SignedJeoffrey Massed 08/04/2012 2:33 PM

## 2012-08-07 ENCOUNTER — Telehealth: Payer: Self-pay | Admitting: *Deleted

## 2012-08-07 DIAGNOSIS — C9110 Chronic lymphocytic leukemia of B-cell type not having achieved remission: Secondary | ICD-10-CM

## 2012-08-07 NOTE — Telephone Encounter (Signed)
Patient called to ask if we could add Na and K+ to lab work when she comes this Friday 9/6 so she will not have to repeat with Dr. Oneta Rack..  OK per Dr. Adora Fridge and left message for patient that we will do this Friday.

## 2012-08-10 ENCOUNTER — Ambulatory Visit (HOSPITAL_BASED_OUTPATIENT_CLINIC_OR_DEPARTMENT_OTHER): Payer: 59 | Admitting: Oncology

## 2012-08-10 ENCOUNTER — Other Ambulatory Visit (HOSPITAL_BASED_OUTPATIENT_CLINIC_OR_DEPARTMENT_OTHER): Payer: 59 | Admitting: Lab

## 2012-08-10 ENCOUNTER — Telehealth: Payer: Self-pay | Admitting: *Deleted

## 2012-08-10 VITALS — BP 135/68 | HR 58 | Temp 96.8°F | Resp 18 | Ht 63.0 in | Wt 193.9 lb

## 2012-08-10 DIAGNOSIS — C9110 Chronic lymphocytic leukemia of B-cell type not having achieved remission: Secondary | ICD-10-CM

## 2012-08-10 LAB — CBC WITH DIFFERENTIAL/PLATELET
BASO%: 0.2 % (ref 0.0–2.0)
EOS%: 1.2 % (ref 0.0–7.0)
HCT: 34.7 % — ABNORMAL LOW (ref 34.8–46.6)
LYMPH%: 75.9 % — ABNORMAL HIGH (ref 14.0–49.7)
MCH: 31.7 pg (ref 25.1–34.0)
MCHC: 33.5 g/dL (ref 31.5–36.0)
MONO#: 0.6 10*3/uL (ref 0.1–0.9)
NEUT%: 20.3 % — ABNORMAL LOW (ref 38.4–76.8)
Platelets: 180 10*3/uL (ref 145–400)
RBC: 3.66 10*6/uL — ABNORMAL LOW (ref 3.70–5.45)
WBC: 24.4 10*3/uL — ABNORMAL HIGH (ref 3.9–10.3)

## 2012-08-10 LAB — COMPREHENSIVE METABOLIC PANEL (CC13)
ALT: 47 U/L (ref 0–55)
AST: 29 U/L (ref 5–34)
Alkaline Phosphatase: 69 U/L (ref 40–150)
CO2: 22 mEq/L (ref 22–29)
Creatinine: 0.8 mg/dL (ref 0.6–1.1)
Sodium: 134 mEq/L — ABNORMAL LOW (ref 136–145)
Total Bilirubin: 0.4 mg/dL (ref 0.20–1.20)
Total Protein: 6.3 g/dL — ABNORMAL LOW (ref 6.4–8.3)

## 2012-08-10 NOTE — Telephone Encounter (Signed)
Per patient requested late appointment for 02-08-2013   11-09-2012 lab only at 4:00pm

## 2012-08-14 NOTE — Progress Notes (Signed)
Hematology and Oncology Follow Up Visit  Rebecca Bond [id number] 02-04-1946 66 y.o. 08/14/2012 9:00 AM   Principle Diagnosis: Encounter Diagnosis  Name Primary?  . CLL (chronic lymphocytic leukemia) Yes     Interim History:   Followup visit for this pleasant 66 year old woman with stage I chronic lymphocytic leukemia found incidentally on a routine mammogram that showed left axillary adenopathy in December 2009. Biopsy revealed CLL and not breast cancer. On physical exam she had a single lymph node palpable high in the left axilla but no other areas of adenopathy. No organomegaly. At time of first visit here in January 2010 total white count 11,000, 44% neutrophils, 48% lymphocytes, hemoglobin 12.9, and platelet count 234,000. She has had wide fluctuations in her white blood count with values recorded as high as 91,000 usually during times of stress or infection. Average white count is running between 20 and 30,000.  She was just discharged from the hospital a few days ago. She got into some problems with her diuretic therapy and developed severe hyponatremia and hypokalemia and presented with profound weakness. Electrolyte abnormalities were reversed quickly. She had no other complications. I saw her briefly in the hospital just to say hello. White count in the hospital was 41,000 with 14% neutrophils and 84% lymphocytes recorded on 08/02/2012. Counts in our office today with white count 24,000, 20% neutrophils, 76% lymphocytes, hemoglobin 11.6 down from 13.4 and the hospital last week, platelet count remains normal at 180,000.  She is in good spirits today. She is fully recovered from recent hospital stay.  Medications: reviewed  Allergies:  Allergies  Allergen Reactions  . Hyzaar (Losartan Potassium-Hctz) Other (See Comments)    "messed up my sodium counts"  . Sudafed (Pseudoephedrine Hcl) Palpitations  . Iohexol Hives     Code: HIVES, Desc: pt states she broke out in hive 20  yrs ago from iv contrast.  she also is refusing (1/14/9) to take benadryl or prednisone for premeds because of the anxiety effect they cause her.  done w/o on 1/14/9 @ [facility] Onset Date: [id number]     Review of Systems: Constitutional:   No constitutional symptoms Respiratory: No cough or dyspnea Cardiovascular:  No chest pain or palpitations Gastrointestinal: No change in bowel habit Genito-Urinary: Not questioned Musculoskeletal: No muscle or bone pain Neurologic: No headache or change in vision Skin: No rash or ecchymosis Remaining ROS negative.  Physical Exam: Blood pressure 135/68, pulse 58, temperature 96.8 F (36 C), temperature source Oral, resp. rate 18, height 5\' 3"  (1.6 m), weight 193 lb 14.4 oz (87.952 kg). Wt Readings from Last 3 Encounters:  08/10/12 193 lb 14.4 oz (87.952 kg)  08/02/12 192 lb 0.3 oz (87.1 kg)  03/27/12 190 lb 4.8 oz (86.32 kg)     General appearance: Well-nourished Caucasian woman HENNT: Pharynx no erythema or exudate Lymph nodes: No cervical, supraclavicular, axillary, or inguinal adenopathy Breasts: Lungs: Clear to auscultation resonant to percussion Heart: Regular rhythm no murmur Abdomen: Soft nontender no mass no organomegaly Extremities: No edema no calf tenderness Vascular: No cyanosis Neurologic: No focal deficit Skin: No rash or ecchymosis  Lab Results: Lab Results  Component Value Date   WBC 24.4* 08/10/2012   HGB 11.6 08/10/2012   HCT 34.7* 08/10/2012   MCV 94.7 08/10/2012   PLT 180 08/10/2012     Chemistry      Component Value Date/Time   NA 134* 08/10/2012 1455   NA 135 08/04/2012 1131   K 3.9 08/10/2012 1455  K 4.5 08/04/2012 1131   CL 102 08/10/2012 1455   CL 103 08/04/2012 1131   CO2 22 08/10/2012 1455   CO2 24 08/04/2012 1131   BUN 16.0 08/10/2012 1455   BUN 8 08/04/2012 1131   CREATININE 0.8 08/10/2012 1455   CREATININE 0.70 08/04/2012 1131      Component Value Date/Time   CALCIUM 9.4 08/10/2012 1455   CALCIUM 9.2 08/04/2012 1131    ALKPHOS 69 08/10/2012 1455   ALKPHOS 58 08/04/2012 1131   AST 29 08/10/2012 1455   AST 70* 08/04/2012 1131   ALT 47 08/10/2012 1455   ALT 62* 08/04/2012 1131   BILITOT 0.40 08/10/2012 1455   BILITOT 0.2* 08/04/2012 1131       Impression and Plan: #1. Stage I chronic lymphocytic leukemia Slowly progressive rise in white blood count with wide fluctuations. Stable hemoglobin and platelet count. No indication to initiate  therapy at this time. There have been some recent advances in this field. I anticipate within the next year or 2 that we will have a oral drug available with potent activity against CLL. Phase 3 clinical trials currently in progress. The drug works to inhibit B-cell signaling in the abnormal lymphocytes and is a tyrosine kinase inhibitor which will be generic name Ibrutinib.  CC:. Dr. Orson Gear, MD 9/10/20139:00 AM

## 2012-09-04 ENCOUNTER — Other Ambulatory Visit: Payer: Self-pay | Admitting: Gastroenterology

## 2012-09-04 DIAGNOSIS — R109 Unspecified abdominal pain: Secondary | ICD-10-CM

## 2012-09-04 DIAGNOSIS — R799 Abnormal finding of blood chemistry, unspecified: Secondary | ICD-10-CM

## 2012-09-06 ENCOUNTER — Other Ambulatory Visit: Payer: 59

## 2012-09-11 ENCOUNTER — Other Ambulatory Visit: Payer: 59

## 2012-09-20 ENCOUNTER — Ambulatory Visit
Admission: RE | Admit: 2012-09-20 | Discharge: 2012-09-20 | Disposition: A | Discharge status: Home / Self Care | Payer: 59 | Source: Ambulatory Visit | Attending: Gastroenterology | Admitting: Gastroenterology

## 2012-09-20 DIAGNOSIS — R109 Unspecified abdominal pain: Secondary | ICD-10-CM

## 2012-09-20 DIAGNOSIS — R799 Abnormal finding of blood chemistry, unspecified: Secondary | ICD-10-CM

## 2012-11-09 ENCOUNTER — Other Ambulatory Visit (HOSPITAL_BASED_OUTPATIENT_CLINIC_OR_DEPARTMENT_OTHER): Payer: 59 | Admitting: Lab

## 2012-11-09 DIAGNOSIS — C9110 Chronic lymphocytic leukemia of B-cell type not having achieved remission: Secondary | ICD-10-CM

## 2012-11-09 LAB — CBC WITH DIFFERENTIAL/PLATELET
BASO%: 0.2 % (ref 0.0–2.0)
EOS%: 1 % (ref 0.0–7.0)
HCT: 35.6 % (ref 34.8–46.6)
LYMPH%: 82 % — ABNORMAL HIGH (ref 14.0–49.7)
MCH: 31.8 pg (ref 25.1–34.0)
MCHC: 33.8 g/dL (ref 31.5–36.0)
MCV: 93.9 fL (ref 79.5–101.0)
MONO#: 0.8 10*3/uL (ref 0.1–0.9)
MONO%: 2.3 % (ref 0.0–14.0)
NEUT%: 14.5 % — ABNORMAL LOW (ref 38.4–76.8)
Platelets: 155 10*3/uL (ref 145–400)
RBC: 3.79 10*6/uL (ref 3.70–5.45)

## 2012-11-14 ENCOUNTER — Telehealth: Payer: Self-pay | Admitting: *Deleted

## 2012-11-14 NOTE — Telephone Encounter (Signed)
Called and spoke with pt; per Dr. Cyndie Chime WBC 33,000 in range of previous values Continue observation alone.  Pt verbalized understanding and confirmed appt for 02/08/13

## 2012-12-31 ENCOUNTER — Encounter: Payer: Self-pay | Admitting: Gastroenterology

## 2013-02-05 ENCOUNTER — Telehealth: Payer: Self-pay | Admitting: *Deleted

## 2013-02-05 NOTE — Telephone Encounter (Signed)
Pt. Called and left message.  She is concerned as she has been on prednisone and antibiotics and wonders if she needs to r/s this Friday's appt.  Discussed with Dr. Cyndie Chime - no keep appt. As scheduled.  Left message on her phone as requested.

## 2013-02-08 ENCOUNTER — Ambulatory Visit (HOSPITAL_BASED_OUTPATIENT_CLINIC_OR_DEPARTMENT_OTHER): Payer: 59 | Admitting: Oncology

## 2013-02-08 ENCOUNTER — Telehealth: Payer: Self-pay | Admitting: Oncology

## 2013-02-08 ENCOUNTER — Other Ambulatory Visit (HOSPITAL_BASED_OUTPATIENT_CLINIC_OR_DEPARTMENT_OTHER): Payer: 59 | Admitting: Lab

## 2013-02-08 VITALS — BP 159/73 | HR 58 | Temp 98.3°F | Resp 20 | Ht 63.0 in | Wt 196.7 lb

## 2013-02-08 DIAGNOSIS — C9110 Chronic lymphocytic leukemia of B-cell type not having achieved remission: Secondary | ICD-10-CM

## 2013-02-08 LAB — CBC WITH DIFFERENTIAL/PLATELET
BASO%: 0.2 % (ref 0.0–2.0)
EOS%: 0.3 % (ref 0.0–7.0)
HCT: 37.5 % (ref 34.8–46.6)
MCH: 31.5 pg (ref 25.1–34.0)
MCHC: 32.8 g/dL (ref 31.5–36.0)
NEUT%: 10 % — ABNORMAL LOW (ref 38.4–76.8)
RBC: 3.9 10*6/uL (ref 3.70–5.45)
WBC: 67.9 10*3/uL (ref 3.9–10.3)
lymph#: 59.5 10*3/uL — ABNORMAL HIGH (ref 0.9–3.3)
nRBC: 0 % (ref 0–0)

## 2013-02-08 NOTE — Progress Notes (Signed)
Hematology and Oncology Follow Up Visit  Rebecca Bond [id number] 07-14-1946 67 y.o. 02/08/2013 5:31 PM   Principle Diagnosis: Encounter Diagnosis  Name Primary?  . CLL (chronic lymphocytic leukemia) Yes     Interim History:  Followup visit for this 67 year old woman with stage I chronic lymphocytic leukemia diagnosed in December 2009 when a routine mammogram showed an enlarged left axillary lymph node. Biopsy consistent with CLL. She has no clinically palpable lymphadenopathy, no anemia, no thrombocytopenia, and has not required any treatment to date. She has had problems with recurrent sinusitis and bronchitis. She was recently put back on antibiotics and a short course of steroids. Steroid just completed 5 days prior to this visit. She has had no other interim medical problems.  Medications: reviewed  Allergies:  Allergies  Allergen Reactions  . Hyzaar (Losartan Potassium-Hctz) Other (See Comments)    "messed up my sodium counts"  . Sudafed (Pseudoephedrine Hcl) Palpitations  . Iohexol Hives     Code: HIVES, Desc: pt states she broke out in hive 20 yrs ago from iv contrast.  she also is refusing (1/14/9) to take benadryl or prednisone for premeds because of the anxiety effect they cause her.  done w/o on 1/14/9 @ [facility] Onset Date: [id number]     Review of Systems: Constitutional:   No constitutional symptoms Respiratory: No cough or dyspnea, she has a sore throat today Cardiovascular:  No chest pain or palpitations Gastrointestinal: No change in bowel habit Genito-Urinary: Not questioned  Musculoskeletal: No pain Neurologic: Some intermittent headaches with her sinus infection. Skin: No rash or ecchymosis Remaining ROS negative.  Physical Exam: Blood pressure 159/73, pulse 58, temperature 98.3 F (36.8 C), temperature source Oral, resp. rate 20, height 5\' 3"  (1.6 m), weight 196 lb 11.2 oz (89.223 kg). Wt Readings from Last 3 Encounters:  02/08/13 196 lb 11.2 oz (89.223  kg)  08/10/12 193 lb 14.4 oz (87.952 kg)  08/02/12 192 lb 0.3 oz (87.1 kg)     General appearance: Well-nourished Caucasian woman HENNT: Pharynx no erythema or exudate Lymph nodes: No cervical, supraclavicular, axillary, or inguinal adenopathy Breasts: Lungs: Clear to auscultation resonant to percussion Heart: Regular rhythm no murmur Abdomen: Abdomen soft, nontender, no mass, no organomegaly Extremities: No edema, no calf tenderness Vascular: No cyanosis Neurologic: Grossly normal Skin: No rash or ecchymosis  Lab Results: Lab Results  Component Value Date   WBC 67.9* 02/08/2013   HGB 12.3 02/08/2013   HCT 37.5 02/08/2013   MCV 96.2 02/08/2013   PLT 155 02/08/2013     Chemistry      Component Value Date/Time   NA 134* 08/10/2012 1455   NA 135 08/04/2012 1131   K 3.9 08/10/2012 1455   K 4.5 08/04/2012 1131   CL 102 08/10/2012 1455   CL 103 08/04/2012 1131   CO2 22 08/10/2012 1455   CO2 24 08/04/2012 1131   BUN 16.0 08/10/2012 1455   BUN 8 08/04/2012 1131   CREATININE 0.8 08/10/2012 1455   CREATININE 0.70 08/04/2012 1131      Component Value Date/Time   CALCIUM 9.4 08/10/2012 1455   CALCIUM 9.2 08/04/2012 1131   ALKPHOS 69 08/10/2012 1455   ALKPHOS 58 08/04/2012 1131   AST 29 08/10/2012 1455   AST 70* 08/04/2012 1131   ALT 47 08/10/2012 1455   ALT 62* 08/04/2012 1131   BILITOT 0.40 08/10/2012 1455   BILITOT 0.2* 08/04/2012 1131     Impression and Plan: #1. Stage I CLL I'm not concerned  with a rise in her white count as well as her hemoglobin and platelets are stable. I will continue to follow lab every 3 months with clinical visits every 6 months. We discussed an overview of treatment for CLL if she gets to the point where she needs it.    CC:.  Dr. Haydee Monica   Levert Feinstein, MD 3/7/20145:31 PM

## 2013-02-08 NOTE — Telephone Encounter (Signed)
Talked to pt and gave her appt for June 2014 lab and MD September after lab draw

## 2013-03-25 ENCOUNTER — Ambulatory Visit (HOSPITAL_COMMUNITY)
Admission: RE | Admit: 2013-03-25 | Discharge: 2013-03-25 | Disposition: A | Discharge status: Home / Self Care | Payer: 59 | Source: Ambulatory Visit | Attending: Cardiovascular Disease | Admitting: Cardiovascular Disease

## 2013-03-25 ENCOUNTER — Other Ambulatory Visit (HOSPITAL_COMMUNITY): Payer: Self-pay | Admitting: Cardiovascular Disease

## 2013-03-25 DIAGNOSIS — I119 Hypertensive heart disease without heart failure: Secondary | ICD-10-CM

## 2013-03-25 LAB — PULMONARY FUNCTION TEST

## 2013-03-25 NOTE — Progress Notes (Signed)
2D Echo Performed 03/25/2013    Gregoire Bennis, RCS  

## 2013-04-19 ENCOUNTER — Encounter: Payer: Self-pay | Admitting: *Deleted

## 2013-04-24 ENCOUNTER — Encounter: Payer: Self-pay | Admitting: Cardiovascular Disease

## 2013-04-24 ENCOUNTER — Ambulatory Visit (INDEPENDENT_AMBULATORY_CARE_PROVIDER_SITE_OTHER): Payer: 59 | Admitting: Cardiovascular Disease

## 2013-04-24 VITALS — BP 154/74 | HR 58 | Ht 63.0 in | Wt 198.0 lb

## 2013-04-24 DIAGNOSIS — R0600 Dyspnea, unspecified: Secondary | ICD-10-CM

## 2013-04-24 DIAGNOSIS — I25810 Atherosclerosis of coronary artery bypass graft(s) without angina pectoris: Secondary | ICD-10-CM

## 2013-04-24 DIAGNOSIS — R0989 Other specified symptoms and signs involving the circulatory and respiratory systems: Secondary | ICD-10-CM

## 2013-04-24 DIAGNOSIS — G4733 Obstructive sleep apnea (adult) (pediatric): Secondary | ICD-10-CM

## 2013-04-24 DIAGNOSIS — R0609 Other forms of dyspnea: Secondary | ICD-10-CM

## 2013-04-24 NOTE — Patient Instructions (Signed)
Your physician has recommended you make the following change in your medication: Decrease your bystolic to 20mg . Restart the HCTZ 12.5.  Your physician recommends that you schedule a follow-up appointment in: 

## 2013-04-25 ENCOUNTER — Encounter: Payer: Self-pay | Admitting: Cardiovascular Disease

## 2013-05-06 NOTE — Progress Notes (Signed)
Patient ID: Rebecca Bond, female   DOB: 1946/09/23, 67 y.o.   MRN: [id number]    HPI: Rebecca Bond, is a 67 y.o. female who presents to the office today for followup cardiology evaluation. Rebecca Bond has a history of hypertension, CLL followed by Rebecca Bond, a history of Hyzaar-induced angioedema, who has had significant edema secondary to amlodipine and Cardizem in the past. She had been recently seen with stage II hypertension in early April at which time hydralazine was added by Rebecca Bond  I saw the patient one week later I further titrated for Bystolic. At that time, I was concerned about the potential for sleep apnea. She also is noticing episodes of shortness of breath and I scheduled her for cardiopulmonary neck test as well as a 2-D echo Doppler study. She presents today for evaluation of the  The echo Doppler study showed mild concentric LVH with normal systolic and diastolic function. Ejection fraction was 55-60%. There was trivial mitral regurgitation. Although not definitively recognized., aPFO could not be 100% excluded. She did undergo a cardiopulmonary neck test which showed mildly reduced functional status with a peak maximum oxygen consumption of 85% of predicted. She did have blunted chronotropic response to exercise. She had mildly abnormal pulmonary response with mild ventilation/perfusion mismatch. 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  Rebecca Bond did undergo a sleep study in 04/12/2013 does confirm my suspicion for obstructive sleep apnea. She had mild sleep apnea with an AHI of 21.3 per hour. However, during sleep, this increased to 53 per hour it was very severe. Dropped her oxygen to 87% with non-REM sleep and 83% during REM sleep. There was evidence for heavy snoring. She will be referred for CPAP titration trial.   Past Medical History  Diagnosis Date  . Benign essential HTN 11/22/2011  . CLL (chronic lymphocytic  leukemia)   . Pneumonia 01/2010  . History of bronchitis 08/02/2012    "maybe once/yr if that much; last time 01/2010"  . Depression   . Recurrent sinus infections 08/02/2012  . 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 High cholesterol   . GERD (gastroesophageal reflux disease)     Past Surgical History  Procedure Laterality Date  . Cholecystectomy  1985  . Tonsillectomy and adenoidectomy  1950's  . Breast surgery    . Lymph node biopsy      "determined I had CLL"  . Temporomandibular joint arthroplasty  1980's  . Functional endoscopic sinus surgery  1990's    "cause I kept having sinus infections"  . Abdominal hysterectomy  1980's    "endometrosis"    Allergies  Allergen Reactions  . Hyzaar (Losartan Potassium-Hctz) Other (See Comments)    "messed up my sodium counts" swells up lips.  . Sudafed (Pseudoephedrine Hcl) Palpitations  . Iohexol Hives     Code: HIVES, Desc: pt states she broke out in hive 20 yrs ago from iv contrast.  she also is refusing (1/14/9) to take benadryl or prednisone for premeds because of the anxiety effect they cause her.  done w/o on 1/14/9 @ [facility] [place] Date: [id number]     Current Outpatient Prescriptions  Medication Sig Dispense Refill  . ALPRAZolam (XANAX) 0.5 MG tablet Take 0.25 mg by mouth daily as needed. For anxiety      . Ascorbic Acid (VITAMIN C) 1000 MG tablet Take 1,000 mg by mouth daily.      [name] Kitchen buPROPion (WELLBUTRIN XL) 300 MG 24 hr tablet  Take 300 mg by mouth Daily.      [name] Kitchen BYSTOLIC 20 MG TABS Take 20 mg by mouth Daily. Patient takes 30mg .      . ezetimibe (ZETIA) 10 MG tablet Take 10 mg by mouth at bedtime.      . fexofenadine (ALLEGRA) 180 MG tablet Take 180 mg by mouth daily.      . fish oil-omega-3 fatty acids 1000 MG capsule Take 1 g by mouth daily.      . montelukast (SINGULAIR) 10 MG tablet Take 10 mg by mouth as needed.      . ranitidine (ZANTAC) 150 MG capsule Take 150 mg by mouth daily as needed for heartburn.      [name] Kitchen oxybutynin  (DITROPAN) 5 MG tablet Take 5 mg by mouth 3 (three) times daily as needed.      . Probiotic Product (ALIGN) 4 MG CAPS Take 4 mg by mouth daily.       No current facility-administered medications for this visit.    She is a married 1 child one stepchild and 5 grandchildren. She does not exercise use tobacco but she does drink occasional alcohol.  ROS is notable for fatigue the does not some mild shortness of breath with activity. She denies chest pressure. She denies tachycardia palpitations. She denies bleeding. She denies indigestion. She denies nausea vomiting or diarrhea. She denies paresthesias. She does snore. She does note nonrestorative sleep. Other system review is negative.  PE BP 154/74  Pulse 58  Ht 5\' 3"  (1.6 m)  Wt 198 lb (89.812 kg)  BMI 35.08 kg/m2  General: Alert, oriented, no distress.  HEENT: Normocephalic, atraumatic. Pupils round and reactive; sclera anicteric;  Nose without nasal septal hypertrophy Mouth/Parynx benign; Mallinpatti scale 3  Neck: No JVD, no carotid briuts Lungs: clear to ausculatation and percussion; no wheezing or rales Heart: RRR, s1 s2 normal 1/6 sem Abdomen: soft, nontender; no hepatosplenomehaly, BS+; abdominal aorta nontender and not dilated by palpation. Pulses 2+ Extremities: no clubbinbg cyanosis or edema, Homan's sign negative  Neurologic: grossly nonfocal  ECG: Sinus rhythm at 58 beats per minute. His reduced R wave progression rhythm no significant ST changes.  LABS:   I did review recent laboratory done on 04/09/2013 by Rebecca Bond. Hemoglobin 11.2 hematocrit 32.0. Potassium 4.0. BUN 10 creatinine 0.79 to total cholesterol 169 triglycerides 76 HDL 58 LDL 96. LFTs reveal very mild SGPT elevated at 57 with normal SGOT of 34 and phosphatase 106. He may go A1c 5.6. Total insulin 12 to  BMET    Component Value Date/Time   NA 134* 08/10/2012 1455   NA 135 08/04/2012 1131   K 3.9 08/10/2012 1455   K 4.5 08/04/2012 1131   CL 102 08/10/2012  1455   CL 103 08/04/2012 1131   CO2 22 08/10/2012 1455   CO2 24 08/04/2012 1131   GLUCOSE 107* 08/10/2012 1455   GLUCOSE 111* 08/04/2012 1131   BUN 16.0 08/10/2012 1455   BUN 8 08/04/2012 1131   CREATININE 0.8 08/10/2012 1455   CREATININE 0.70 08/04/2012 1131   CALCIUM 9.4 08/10/2012 1455   CALCIUM 9.2 08/04/2012 1131   GFRNONAA 89* 08/04/2012 1131   GFRAA >90 08/04/2012 1131     Hepatic Function Panel     Component Value Date/Time   PROT 6.3* 08/10/2012 1455   PROT 6.1 08/04/2012 1131   ALBUMIN 4.0 08/10/2012 1455   ALBUMIN 3.5 08/04/2012 1131   AST 29 08/10/2012 1455   AST 70* 08/04/2012 1131  ALT 47 08/10/2012 1455   ALT 62* 08/04/2012 1131   ALKPHOS 69 08/10/2012 1455   ALKPHOS 58 08/04/2012 1131   BILITOT 0.40 08/10/2012 1455   BILITOT 0.2* 08/04/2012 1131     CBC    Component Value Date/Time   WBC 67.9* 02/08/2013 1457   WBC 35.7* 08/03/2012 0550   RBC 3.90 02/08/2013 1457   RBC 3.71* 08/03/2012 0550   HGB 12.3 02/08/2013 1457   HGB 11.9* 08/03/2012 0550   HCT 37.5 02/08/2013 1457   HCT 32.9* 08/03/2012 0550   PLT 155 02/08/2013 1457   PLT 155 08/03/2012 0550   MCV 96.2 02/08/2013 1457   MCV 88.7 08/03/2012 0550   MCH 31.5 02/08/2013 1457   MCH 32.1 08/03/2012 0550   MCHC 32.8 02/08/2013 1457   MCHC 36.2* 08/03/2012 0550   RDW 13.9 02/08/2013 1457   RDW 12.5 08/03/2012 0550   LYMPHSABS 59.5* 02/08/2013 1457   LYMPHSABS 34.3* 08/02/2012 1256   MONOABS 1.3* 02/08/2013 1457   MONOABS 0.8 08/02/2012 1256   EOSABS 0.2 02/08/2013 1457   EOSABS 0.0 08/02/2012 1256   BASOSABS 0.1 02/08/2013 1457   BASOSABS 0.0 08/02/2012 1256     BNP No results found for this basename: probnp    Lipid Panel  No results found for this basename: chol, trig, hdl, cholhdl, vldl, ldlcalc     RADIOLOGY: No results found.    ASSESSMENT AND PLAN: A long discussion of this measures. I reviewed her echo Doppler study which confirms normal systolic and diastolic function. Her cardiopulmonary that test does not reveal an ischemic response  but does suggest perhaps some of her shortness of breath may be due to chronotropic incompetence as well as some mild ventilation perfusion mismatch. Presently, I am recommending that Ms. Jacquot decrease her Bystolic from 30 mg back down to 20 mg. This should improve her ability to increase her heart rate with activity which may improve her shortness of breath in exercise capacity. Also discussed the importance of increased exercise and weight loss. She will undergo a CPAP titration with for further evaluation of him at least moderate obstructive sleep apnea which is very sent here during REM sleep. Apparently she had stopped taking the hydralazine. In the past she has developed edema with calcium channel blocker therapy. She will continue to monitor her blood pressure closely. I will see her back in the office in followup of her sleep studies and further recommendations will be made at that time.     Lennette Bihari, MD, [facility]  05/06/2013 9:04 AM

## 2013-05-08 ENCOUNTER — Telehealth: Payer: Self-pay | Admitting: Oncology

## 2013-05-08 NOTE — Telephone Encounter (Signed)
Pt called and r/s lab from 6/13 to 6/12 , nurse notified

## 2013-05-15 ENCOUNTER — Other Ambulatory Visit: Payer: 59 | Admitting: Lab

## 2013-05-16 ENCOUNTER — Other Ambulatory Visit (HOSPITAL_BASED_OUTPATIENT_CLINIC_OR_DEPARTMENT_OTHER): Payer: 59 | Admitting: Lab

## 2013-05-16 DIAGNOSIS — C9110 Chronic lymphocytic leukemia of B-cell type not having achieved remission: Secondary | ICD-10-CM

## 2013-05-16 LAB — CBC WITH DIFFERENTIAL/PLATELET
BASO%: 0.5 % (ref 0.0–2.0)
HCT: 35.4 % (ref 34.8–46.6)
MCHC: 34.2 g/dL (ref 31.5–36.0)
MONO#: 0.9 10*3/uL (ref 0.1–0.9)
RBC: 3.69 10*6/uL — ABNORMAL LOW (ref 3.70–5.45)
WBC: 38.6 10*3/uL — ABNORMAL HIGH (ref 3.9–10.3)
lymph#: 31.4 10*3/uL — ABNORMAL HIGH (ref 0.9–3.3)

## 2013-05-16 LAB — TECHNOLOGIST REVIEW

## 2013-05-17 ENCOUNTER — Other Ambulatory Visit: Payer: 59

## 2013-05-17 DIAGNOSIS — G4730 Sleep apnea, unspecified: Secondary | ICD-10-CM

## 2013-05-21 ENCOUNTER — Telehealth: Payer: Self-pay | Admitting: *Deleted

## 2013-05-21 NOTE — Telephone Encounter (Signed)
Left message on known voice mail re: results and if any questions to call office.

## 2013-05-21 NOTE — Telephone Encounter (Signed)
Message copied by Gala Romney on Tue May 21, 2013 12:10 PM ------      Message from: Levert Feinstein      Created: Thu May 16, 2013  4:21 PM       Call pt  WBC down from 68,000 to 39,000 ------

## 2013-06-06 ENCOUNTER — Telehealth: Payer: Self-pay | Admitting: Oncology

## 2013-06-06 NOTE — Telephone Encounter (Signed)
pt called and r/s lab and MD to September 2014 , MD notified

## 2013-06-19 ENCOUNTER — Telehealth: Payer: Self-pay | Admitting: Cardiovascular Disease

## 2013-06-19 NOTE — Telephone Encounter (Signed)
Would like to know if the MRD would be ok for her ..( Mandibular Repositioning Device )  Would like to know if that could be an option for her versus the mask .[name] Kitchen Please call..Can leave her a message   Thanks

## 2013-06-19 NOTE — Telephone Encounter (Signed)
Informed telephone operator to route this message to Dr. Tresa Endo. He will need to answer this question about her CPAP mask.

## 2013-06-21 ENCOUNTER — Telehealth: Payer: Self-pay | Admitting: *Deleted

## 2013-06-21 NOTE — Telephone Encounter (Signed)
Called patient to answer question concerning the use of a MRD.  Home number busy X 2.  cell phone turned off.

## 2013-06-24 ENCOUNTER — Telehealth: Payer: Self-pay | Admitting: Cardiovascular Disease

## 2013-06-24 NOTE — Telephone Encounter (Signed)
Patient returned your call from Friday----states that she does not want to have another sleep study--she will just use her CPAP machine.

## 2013-06-24 NOTE — Telephone Encounter (Signed)
Patient wants to get a copy of her Sleep study and the order for her CPAP machine.

## 2013-06-24 NOTE — Telephone Encounter (Signed)
Returned call to home and mobile numbers.  Left message to call back before 4pm.

## 2013-06-24 NOTE — Telephone Encounter (Signed)
Returned call.  Left message to call back tomorrow before 4pm if assistance still needed. 

## 2013-06-25 NOTE — Telephone Encounter (Signed)
error 

## 2013-06-25 NOTE — Telephone Encounter (Signed)
Copy of sleep study received and placed on Dr. Landry Dyke cart for review w/ pt's chart when he returns to clinic.  Paper chart# [id number].

## 2013-06-25 NOTE — Telephone Encounter (Signed)
Returning your call she said to disregard it all and to Thank you

## 2013-06-25 NOTE — Telephone Encounter (Signed)
Returning your Engineer, drilling

## 2013-06-25 NOTE — Telephone Encounter (Signed)
Returned call.  Left message to call back before 4pm.  Also stated information has been forwarded to Dr. Tresa Endo to review in the office on Friday.  If questions/concerns, call back and leave message w/ details since we have been unable to connect.

## 2013-07-10 ENCOUNTER — Telehealth: Payer: Self-pay | Admitting: Cardiovascular Disease

## 2013-07-10 NOTE — Telephone Encounter (Signed)
Please call-said she was told her chart was on Dr Landry Dyke cart by Medical Records..All she wants is her records so she can get her sleep machine.She is planning to buy it from another company,not the one we use,She have to have her records.

## 2013-07-11 ENCOUNTER — Telehealth: Payer: Self-pay | Admitting: *Deleted

## 2013-07-11 NOTE — Telephone Encounter (Signed)
Informed patient name and number of DME company needed. She will get information and call back with it.

## 2013-07-11 NOTE — Telephone Encounter (Signed)
Spoke with patient informing her that I need the name, number and fax number of the DME company that she is trying to change to so that I can do the referral. She states that she doesn't know she will phone me back with the information. Her friend works there and states that she can help her with the cost of her supplies.

## 2013-08-09 ENCOUNTER — Other Ambulatory Visit: Payer: 59

## 2013-08-16 ENCOUNTER — Ambulatory Visit: Payer: 59 | Admitting: Oncology

## 2013-08-30 ENCOUNTER — Other Ambulatory Visit (HOSPITAL_BASED_OUTPATIENT_CLINIC_OR_DEPARTMENT_OTHER): Payer: 59 | Admitting: Lab

## 2013-08-30 DIAGNOSIS — C9110 Chronic lymphocytic leukemia of B-cell type not having achieved remission: Secondary | ICD-10-CM

## 2013-08-30 LAB — COMPREHENSIVE METABOLIC PANEL (CC13)
Albumin: 3.9 g/dL (ref 3.5–5.0)
Alkaline Phosphatase: 68 U/L (ref 40–150)
CO2: 24 mEq/L (ref 22–29)
Glucose: 93 mg/dl (ref 70–140)
Potassium: 3.8 mEq/L (ref 3.5–5.1)
Sodium: 137 mEq/L (ref 136–145)
Total Protein: 6.7 g/dL (ref 6.4–8.3)

## 2013-08-30 LAB — CBC WITH DIFFERENTIAL/PLATELET
Basophils Absolute: 0.1 10*3/uL (ref 0.0–0.1)
EOS%: 0.3 % (ref 0.0–7.0)
Eosinophils Absolute: 0.2 10*3/uL (ref 0.0–0.5)
HGB: 12 g/dL (ref 11.6–15.9)
MONO#: 1.9 10*3/uL — ABNORMAL HIGH (ref 0.1–0.9)
NEUT#: 10 10*3/uL — ABNORMAL HIGH (ref 1.5–6.5)
RDW: 14 % (ref 11.2–14.5)
lymph#: 70.7 10*3/uL — ABNORMAL HIGH (ref 0.9–3.3)

## 2013-09-02 ENCOUNTER — Ambulatory Visit (INDEPENDENT_AMBULATORY_CARE_PROVIDER_SITE_OTHER): Payer: 59 | Admitting: Cardiovascular Disease

## 2013-09-02 ENCOUNTER — Encounter: Payer: Self-pay | Admitting: Cardiovascular Disease

## 2013-09-02 VITALS — BP 180/80 | HR 60 | Ht 63.0 in | Wt 200.4 lb

## 2013-09-02 DIAGNOSIS — R531 Weakness: Secondary | ICD-10-CM

## 2013-09-02 DIAGNOSIS — G4733 Obstructive sleep apnea (adult) (pediatric): Secondary | ICD-10-CM

## 2013-09-02 DIAGNOSIS — R5381 Other malaise: Secondary | ICD-10-CM

## 2013-09-02 DIAGNOSIS — I10 Essential (primary) hypertension: Secondary | ICD-10-CM

## 2013-09-02 NOTE — Addendum Note (Signed)
Addended by: Nicki Guadalajara A on: 09/02/2013 11:24 AM   Modules accepted: Level of Service

## 2013-09-02 NOTE — Progress Notes (Signed)
Patient ID: Rebecca Bond, female   DOB: 11-15-1946, 67 y.o.   MRN: [id number]   HPI: Rebecca Bond, is a 67 y.o. female who presents for sleep clinic evaluation following initiation of CPAP therapy.  Rebecca Bond is a 67 year old female who has a history of hypertension, CLL, and has had intolerance to numerous medications. She recently was found to have stage II hypertension in her medications have been adjusted the due to concerns of obstructive sleep apnea, referred her for a sleep study the this confirmed at least moderate sleep apnea overall with an AHI of 21.4 but her sleep apnea was severe during REM sleep within a giant 53.1 per hour. During her diagnostic study, she dropped her oxygen to 83% with REM sleep and had evidence for loud snoring.  She subtotally underwent a CPAP titration trial which was done on 05/17/2013. His titrated up to a 9 cm water pressure and had excellent response. Optimally, she had been utilizing CPAP therapy and hasn't S9 ResMed unit she is using an air-filled P10 for her with medium pillow for her mask.  We did obtain a download from August 27 through 08/29/2013. She has demonstrated excellent compliance. She was unable to use the machine the first several days of getting this. Ultimately she has been using it essentially all the time. She is averaging 10 hours and 19 minutes at a set pressure of 9 cm per AHI is excellent at 0.4. She does not have any significant leak. Since initiating therapy she feels her energy level is improved. She's no longer snoring. She denies any significant residual daytime sleepiness.     Epworth Sleepiness Scale: Situation   Chance of Dozing/Sleeping (0 = never , 1 = slight chance , 2 = moderate chance , 3 = high chance )   sitting and reading 0   watching TV 0   sitting inactive in a public place 0   being a passenger in a motor vehicle for an hour or more 0   lying down in the afternoon 1   sitting and talking to someone 0   sitting quietly after lunch (no alcohol) 0   while stopped for a few minutes in traffic as the driver 0   Total Score  1    Past Medical History  Diagnosis Date  . Benign essential HTN 11/22/2011  . CLL (chronic lymphocytic leukemia)   . Pneumonia 01/2010  . History of bronchitis 08/02/2012    "maybe once/yr if that much; last time 01/2010"  . Depression   . Recurrent sinus infections 08/02/2012  . 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 High cholesterol   . GERD (gastroesophageal reflux disease)     Past Surgical History  Procedure Laterality Date  . Cholecystectomy  1985  . Tonsillectomy and adenoidectomy  1950's  . Breast surgery    . Lymph node biopsy      "determined I had CLL"  . Temporomandibular joint arthroplasty  1980's  . Functional endoscopic sinus surgery  1990's    "cause I kept having sinus infections"  . Abdominal hysterectomy  1980's    "endometrosis"    Allergies  Allergen Reactions  . Hyzaar [Losartan Potassium-Hctz] Other (See Comments)    "messed up my sodium counts" swells up lips.  Lyman Bishop [Pseudoephedrine Hcl] Palpitations  . Iohexol Hives     Code: HIVES, Desc: pt states she broke out in hive 20 yrs ago from iv contrast.  she also is refusing (1/14/9) to take  benadryl or prednisone for premeds because of the anxiety effect they cause her.  done w/o on 1/14/9 @ [facility] [place]set Date: [id number]     Current Outpatient Prescriptions  Medication Sig Dispense Refill  . ALPRAZolam (XANAX) 0.5 MG tablet Take 0.25 mg by mouth daily as needed. For anxiety      . Ascorbic Acid (VITAMIN C) 1000 MG tablet Take 1,000 mg by mouth daily.      [name] Kitchen buPROPion (WELLBUTRIN XL) 300 MG 24 hr tablet Take 300 mg by mouth Daily.      [name] Kitchen BYSTOLIC 20 MG TABS Take 20 mg by mouth Daily. Patient takes 30mg .      . ezetimibe (ZETIA) 10 MG tablet Take 10 mg by mouth at bedtime.      . fexofenadine (ALLEGRA) 180 MG tablet Take 180 mg by mouth daily.      . fish oil-omega-3 fatty acids 1000  MG capsule Take 1 g by mouth daily.      . hydrochlorothiazide (HYDRODIURIL) 12.5 MG tablet Take 12.5 mg by mouth daily.      . Probiotic Product (ALIGN) 4 MG CAPS Take 4 mg by mouth daily.      . ranitidine (ZANTAC) 150 MG capsule Take 150 mg by mouth daily as needed for heartburn.      [name] Kitchen UNABLE TO FIND CPAP therapy.       No current facility-administered medications for this visit.    Socially she is married has one stepchild and one regular child and 5 grandchildren. There is no tobacco use. She does drink occasional alcohol.  ROS negative for fever, chills or night sweats. She denies palpitations. She denies bruxism. Her sleep pattern is improved. Denies chest pain. She denies wheezing. She denies presyncope or syncope. There is no edema. She denies restless legs. Other system review is negative.  PE BP 180/80  Pulse 60  Ht 5\' 3"  (1.6 m)  Wt 200 lb 6.4 oz (90.901 kg)  BMI 35.51 kg/m2  Repeat blood pressure by me was 138/84 General: Alert, oriented, no distress.  Skin: normal turgor, no rashes HEENT: Normocephalic, atraumatic. Pupils round and reactive; sclera anicteric;  Nose without nasal septal hypertrophy Mouth/Parynx benign; Mallinpatti scale 3 Neck: No JVD, no carotid briuts Lungs: clear to ausculatation and percussion; no wheezing or rales Heart: RRR, s1 s2 normal 1/6 sem Abdomen: soft, nontender; no hepatosplenomehaly, BS+; abdominal aorta nontender and not dilated by palpation. Pulses 2+ Extremities: no clubbinbg cyanosis or edema, Homan's sign negative  Neurologic: grossly nonfocal    LABS:  BMET    Component Value Date/Time   NA 137 08/30/2013 1604   NA 135 08/04/2012 1131   K 3.8 08/30/2013 1604   K 4.5 08/04/2012 1131   CL 102 08/10/2012 1455   CL 103 08/04/2012 1131   CO2 24 08/30/2013 1604   CO2 24 08/04/2012 1131   GLUCOSE 93 08/30/2013 1604   GLUCOSE 107* 08/10/2012 1455   GLUCOSE 111* 08/04/2012 1131   BUN 25.7 08/30/2013 1604   BUN 8 08/04/2012 1131    CREATININE 0.8 08/30/2013 1604   CREATININE 0.70 08/04/2012 1131   CALCIUM 9.8 08/30/2013 1604   CALCIUM 9.2 08/04/2012 1131   GFRNONAA 89* 08/04/2012 1131   GFRAA >90 08/04/2012 1131     Hepatic Function Panel     Component Value Date/Time   PROT 6.7 08/30/2013 1604   PROT 6.1 08/04/2012 1131   ALBUMIN 3.9 08/30/2013 1604   ALBUMIN 3.5 08/04/2012 1131   AST  22 08/30/2013 1604   AST 70* 08/04/2012 1131   ALT 25 08/30/2013 1604   ALT 62* 08/04/2012 1131   ALKPHOS 68 08/30/2013 1604   ALKPHOS 58 08/04/2012 1131   BILITOT 0.65 08/30/2013 1604   BILITOT 0.2* 08/04/2012 1131     CBC    Component Value Date/Time   WBC 82.9* 08/30/2013 1604   WBC 35.7* 08/03/2012 0550   RBC 3.79 08/30/2013 1604   RBC 3.71* 08/03/2012 0550   HGB 12.0 08/30/2013 1604   HGB 11.9* 08/03/2012 0550   HCT 37.4 08/30/2013 1604   HCT 32.9* 08/03/2012 0550   PLT 156 08/30/2013 1604   PLT 155 08/03/2012 0550   MCV 98.8 08/30/2013 1604   MCV 88.7 08/03/2012 0550   MCH 31.7 08/30/2013 1604   MCH 32.1 08/03/2012 0550   MCHC 32.1 08/30/2013 1604   MCHC 36.2* 08/03/2012 0550   RDW 14.0 08/30/2013 1604   RDW 12.5 08/03/2012 0550   LYMPHSABS 70.7* 08/30/2013 1604   LYMPHSABS 34.3* 08/02/2012 1256   MONOABS 1.9* 08/30/2013 1604   MONOABS 0.8 08/02/2012 1256   EOSABS 0.2 08/30/2013 1604   EOSABS 0.0 08/02/2012 1256   BASOSABS 0.1 08/30/2013 1604   BASOSABS 0.0 08/02/2012 1256     BNP No results found for this basename: probnp    Lipid Panel  No results found for this basename: chol, trig, hdl, cholhdl, vldl, ldlcalc     RADIOLOGY: No results found.    ASSESSMENT AND PLAN: From a sleep perspective, Rebecca Bond is doing exceptionally well. She does have moderate obstructive sleep apnea overall but her sleep apnea was very severe with REM sleep. Since initiating CPAP therapy her energy level has markedly improved. There is no residual daytime sleepiness. She denies any breakthrough snoring. Her husband is now sleeping better. Now  that she has more energy, she will commence an exercise program and attempt weight reduction. She still has some blood pressure lability but on repeat her blood pressure was normal when taken by me. All questions were answered to her sleep and equipment the take her current additional medications. I will see her in 6 months for followup cardiology evaluation.     Lennette Bihari, MD, [facility]  09/02/2013 11:14 AM

## 2013-09-02 NOTE — Patient Instructions (Addendum)
Your physician recommends that you schedule a follow-up appointment in: 6 months for cardiology. And as needed for sleep.

## 2013-09-03 ENCOUNTER — Ambulatory Visit (HOSPITAL_BASED_OUTPATIENT_CLINIC_OR_DEPARTMENT_OTHER): Payer: 59 | Admitting: Oncology

## 2013-09-03 ENCOUNTER — Telehealth: Payer: Self-pay | Admitting: Oncology

## 2013-09-03 ENCOUNTER — Other Ambulatory Visit: Payer: 59 | Admitting: Lab

## 2013-09-03 VITALS — BP 144/69 | HR 82 | Temp 97.2°F | Resp 20 | Ht 63.0 in | Wt 200.6 lb

## 2013-09-03 DIAGNOSIS — C9110 Chronic lymphocytic leukemia of B-cell type not having achieved remission: Secondary | ICD-10-CM

## 2013-09-03 DIAGNOSIS — J42 Unspecified chronic bronchitis: Secondary | ICD-10-CM

## 2013-09-03 DIAGNOSIS — D801 Nonfamilial hypogammaglobulinemia: Secondary | ICD-10-CM

## 2013-09-03 DIAGNOSIS — J329 Chronic sinusitis, unspecified: Secondary | ICD-10-CM

## 2013-09-03 NOTE — Telephone Encounter (Signed)
Gave pt appt for labs on December , MD and labs on MArch 2014

## 2013-09-04 ENCOUNTER — Encounter: Payer: Self-pay | Admitting: Oncology

## 2013-09-04 NOTE — Progress Notes (Signed)
Hematology and Oncology Follow Up Visit  Rebecca Bond [id number] 07-29-1946 67 y.o. 09/04/2013 7:04 PM   Principle Diagnosis: Encounter Diagnosis  Name Primary?  . 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 Biopsy consistent with CLL. She has no clinically palpable lymphadenopathy, no anemia, no thrombocytopenia, and has not required any treatment to date. It has been hard to follow the trend in her white blood count since she takes frequent courses of steroids for flareups of recurrent sinusitis and bronchitis. She had a particularly severe flare just 2 weeks ago. Her face swelled up. She had periorbital edema. She was put on oral prednisone following a prednisone injection. She just tapered herself off the prednisone as of September 20. This is reflected in her white count done on September 26 which was up to 83,000, her highest value yet. Hemoglobin and platelet count remained unchanged at 12 g and 156,000 respectively. In addition to medication, she was started on CPAP and notes considerable improvement in her breathing. She was put on a course of antibiotics for the recent sinusitis and allergy flareup. She did receive her flu vaccine for this season.   Medications: reviewed  Allergies:  Allergies  Allergen Reactions  . Hyzaar [Losartan Potassium-Hctz] Other (See Comments)    "messed up my sodium counts" swells up lips.  Lyman Bishop [Pseudoephedrine Hcl] Palpitations  . Iohexol Hives     Code: HIVES, Desc: pt states she broke out in hive 20 yrs ago from iv contrast.  she also is refusing (1/14/9) to take benadryl or prednisone for premeds because of the anxiety effect they cause her.  done w/o on 1/14/9 @ [facility] Onset Date: [id number]--Pt states that she did take the benadryl &  prednisone.     Review of Systems: Constitutional:  No fever, anorexia or weight loss  HEENT no sore throat. Recent sinusitis. No associated cough. Respiratory: Difficulty breathing due to congestion relieved with CPAP Cardiovascular:  No ischemic type chest pain or pressure. No palpitations Gastrointestinal: No abdominal pain or change in bowel habit Genito-Urinary: No urinary tract symptoms  Musculoskeletal: No muscle bone or joint pain Neurologic: No headache or change in vision Skin: No rash or ecchymosis OS negative.    Physical Exam: Blood pressure 144/69, pulse 82, temperature 97.2 F (36.2 C), temperature source Oral, resp. rate 20, height 5\' 3"  (1.6 m), weight 200 lb 9.6 oz (90.992 kg). Wt Readings from Last 3 Encounters:  09/03/13 200 lb 9.6 oz (90.992 kg)  09/02/13 200 lb 6.4 oz (90.901 kg)  04/24/13 198 lb (89.812 kg)     General appearance: Well-nourished Caucasian woman HENNT: Pharynx no erythema or exudate. No thyromegaly Lymph nodes: No cervical, supraclavicular, or axillary adenopathy Breasts: Lungs: Clear to auscultation resonant to percussion Heart: Regular rhythm no murmur Abdomen: Soft, obese, nontender, no mass, no organomegaly Extremities: No edema, no calf tenderness Musculoskeletal: No joint deformities GU: Vascular: No carotid bruits, no cyanosis Neurologic: Mental status intact, cranial nerves grossly normal, motor strength 5 over 5 Skin: No rash or ecchymosis  Lab Results: CBC W/Diff  White count differential: 12% neutrophils, 85% lymphocytes   Component Value Date/Time   WBC 82.9* 08/30/2013 1604   WBC 35.7* 08/03/2012 0550   RBC 3.79 08/30/2013 1604   RBC 3.71* 08/03/2012 0550   HGB 12.0 08/30/2013 1604  HGB 11.9* 08/03/2012 0550   HCT 37.4 08/30/2013 1604   HCT 32.9* 08/03/2012 0550   PLT 156 08/30/2013 1604   PLT 155 08/03/2012 0550   MCV 98.8 08/30/2013 1604   MCV 88.7 08/03/2012 0550   MCH 31.7 08/30/2013 1604   MCH 32.1 08/03/2012 0550    MCHC 32.1 08/30/2013 1604   MCHC 36.2* 08/03/2012 0550   RDW 14.0 08/30/2013 1604   RDW 12.5 08/03/2012 0550   LYMPHSABS 70.7* 08/30/2013 1604   LYMPHSABS 34.3* 08/02/2012 1256   MONOABS 1.9* 08/30/2013 1604   MONOABS 0.8 08/02/2012 1256   EOSABS 0.2 08/30/2013 1604   EOSABS 0.0 08/02/2012 1256   BASOSABS 0.1 08/30/2013 1604   BASOSABS 0.0 08/02/2012 1256     Chemistry      Component Value Date/Time   NA 137 08/30/2013 1604   NA 135 08/04/2012 1131   K 3.8 08/30/2013 1604   K 4.5 08/04/2012 1131   CL 102 08/10/2012 1455   CL 103 08/04/2012 1131   CO2 24 08/30/2013 1604   CO2 24 08/04/2012 1131   BUN 25.7 08/30/2013 1604   BUN 8 08/04/2012 1131   CREATININE 0.8 08/30/2013 1604   CREATININE 0.70 08/04/2012 1131      Component Value Date/Time   CALCIUM 9.8 08/30/2013 1604   CALCIUM 9.2 08/04/2012 1131   ALKPHOS 68 08/30/2013 1604   ALKPHOS 58 08/04/2012 1131   AST 22 08/30/2013 1604   AST 70* 08/04/2012 1131   ALT 25 08/30/2013 1604   ALT 62* 08/04/2012 1131   BILITOT 0.65 08/30/2013 1604   BILITOT 0.2* 08/04/2012 1131      Impression: #1. Stage 0-1 chronic lymphocytic leukemia No palpable adenopathy but initial diagnosis made on needle biopsy of an axillary node found on the mammogram. She remains overall stable on observation alone. We discussed again that criteria to begin treatment related to significant anemia or thrombocytopenia and not to the elevation of the white count.  #2. Recurrent sinusitis and bronchitis. Some of this likely relates to seasonal allergies but her baseline immunoglobulins are all suppressed from the CLL. If she continues to have frequent flareups, I think it would be reasonable to put her on a trial of monthly low dose intravenous immunoglobulin for 6 months-one year.    CC:. Dr. Haydee Monica   Levert Feinstein, MD 10/1/20147:04 PM

## 2013-09-09 ENCOUNTER — Other Ambulatory Visit: Payer: Self-pay | Admitting: Internal Medicine

## 2013-09-09 DIAGNOSIS — K921 Melena: Secondary | ICD-10-CM

## 2013-09-09 DIAGNOSIS — R109 Unspecified abdominal pain: Secondary | ICD-10-CM

## 2013-09-10 ENCOUNTER — Ambulatory Visit
Admission: RE | Admit: 2013-09-10 | Discharge: 2013-09-10 | Disposition: A | Discharge status: Home / Self Care | Payer: 59 | Source: Ambulatory Visit | Attending: Internal Medicine | Admitting: Internal Medicine

## 2013-09-10 DIAGNOSIS — K921 Melena: Secondary | ICD-10-CM

## 2013-09-10 DIAGNOSIS — R109 Unspecified abdominal pain: Secondary | ICD-10-CM

## 2013-09-24 ENCOUNTER — Encounter: Payer: Self-pay | Admitting: Oncology

## 2013-11-19 ENCOUNTER — Telehealth: Payer: Self-pay | Admitting: Oncology

## 2013-11-19 NOTE — Telephone Encounter (Signed)
Pt called r/s lab from 12/30 to january 2015

## 2013-12-03 ENCOUNTER — Other Ambulatory Visit: Payer: 59

## 2013-12-04 ENCOUNTER — Other Ambulatory Visit: Payer: Self-pay | Admitting: *Deleted

## 2013-12-04 MED ORDER — EZETIMIBE 10 MG PO TABS: 10.0000 mg | ORAL_TABLET | Freq: Every day | ORAL | Status: DC

## 2013-12-13 ENCOUNTER — Other Ambulatory Visit (HOSPITAL_BASED_OUTPATIENT_CLINIC_OR_DEPARTMENT_OTHER): Payer: 59

## 2013-12-13 DIAGNOSIS — C9110 Chronic lymphocytic leukemia of B-cell type not having achieved remission: Secondary | ICD-10-CM

## 2013-12-13 LAB — CBC WITH DIFFERENTIAL/PLATELET
BASO%: 0.3 % (ref 0.0–2.0)
Basophils Absolute: 0.2 10*3/uL — ABNORMAL HIGH (ref 0.0–0.1)
EOS%: 0.4 % (ref 0.0–7.0)
Eosinophils Absolute: 0.2 10*3/uL (ref 0.0–0.5)
HCT: 37 % (ref 34.8–46.6)
HGB: 12.4 g/dL (ref 11.6–15.9)
LYMPH#: 43.9 10*3/uL — AB (ref 0.9–3.3)
LYMPH%: 85.8 % — ABNORMAL HIGH (ref 14.0–49.7)
MCH: 33.5 pg (ref 25.1–34.0)
MCHC: 33.5 g/dL (ref 31.5–36.0)
MCV: 99.7 fL (ref 79.5–101.0)
MONO#: 0.9 10*3/uL (ref 0.1–0.9)
MONO%: 1.9 % (ref 0.0–14.0)
NEUT#: 5.9 10*3/uL (ref 1.5–6.5)
NEUT%: 11.6 % — ABNORMAL LOW (ref 38.4–76.8)
Platelets: 145 10*3/uL (ref 145–400)
RBC: 3.71 10*6/uL (ref 3.70–5.45)
RDW: 14.1 % (ref 11.2–14.5)
WBC: 51.2 10*3/uL — AB (ref 3.9–10.3)

## 2013-12-13 LAB — TECHNOLOGIST REVIEW

## 2013-12-17 ENCOUNTER — Ambulatory Visit (INDEPENDENT_AMBULATORY_CARE_PROVIDER_SITE_OTHER): Payer: 59 | Admitting: Internal Medicine

## 2013-12-17 ENCOUNTER — Encounter: Payer: Self-pay | Admitting: Internal Medicine

## 2013-12-17 ENCOUNTER — Telehealth: Payer: Self-pay | Admitting: *Deleted

## 2013-12-17 VITALS — BP 124/84 | HR 64 | Temp 98.1°F | Resp 18 | Wt 207.2 lb

## 2013-12-17 DIAGNOSIS — Z6837 Body mass index (BMI) 37.0-37.9, adult: Secondary | ICD-10-CM | POA: Insufficient documentation

## 2013-12-17 DIAGNOSIS — F341 Dysthymic disorder: Secondary | ICD-10-CM

## 2013-12-17 DIAGNOSIS — J019 Acute sinusitis, unspecified: Secondary | ICD-10-CM

## 2013-12-17 DIAGNOSIS — E6609 Other obesity due to excess calories: Secondary | ICD-10-CM | POA: Insufficient documentation

## 2013-12-17 MED ORDER — PREDNISONE 20 MG PO TABS: 20.0000 mg | ORAL_TABLET | ORAL | Status: DC

## 2013-12-17 MED ORDER — PHENTERMINE HCL 37.5 MG PO TABS: ORAL_TABLET | ORAL | Status: DC

## 2013-12-17 MED ORDER — LEVOFLOXACIN 500 MG PO TABS: 500.0000 mg | ORAL_TABLET | Freq: Every day | ORAL | Status: AC

## 2013-12-17 NOTE — Telephone Encounter (Signed)
Message copied by Domenic Schwab on Tue Dec 17, 2013 10:29 AM ------      Message from: Annia Belt      Created: Fri Dec 13, 2013  6:55 PM       Call pt:  White count has settled down from 83,000 to current value of 51,000 ------

## 2013-12-17 NOTE — Telephone Encounter (Signed)
Per Dr. Beryle Beams; notified pt WBC has settled down from 83 to current value on 12/13/13 to 51.  Pt verbalized understanding and expressed appreciation for call.

## 2013-12-17 NOTE — Progress Notes (Signed)
   Subjective:    Patient ID: Rebecca Bond, female    DOB: November 06, 1946, 68 y.o.   MRN: [id number]  Sinusitis This is a recurrent problem. The current episode started in the past 7 days. The problem has been gradually worsening since onset. The maximum temperature recorded prior to her arrival was 100 - 100.9 F. The pain is moderate. Associated symptoms include chills, congestion, coughing, diaphoresis, ear pain, headaches and sinus pressure. Pertinent negatives include no hoarse voice, neck pain, shortness of breath, sneezing, sore throat or swollen glands. Past treatments include nothing.  Also, she c/o 20 # weight gain and poor self esteem.    Review of Systems  Constitutional: Positive for chills and diaphoresis.  HENT: Positive for congestion, ear pain and sinus pressure. Negative for hoarse voice, sneezing and sore throat.   Respiratory: Positive for cough. Negative for shortness of breath.   Musculoskeletal: Negative for neck pain.  Neurological: Positive for headaches.       Objective:   Physical Exam  Constitutional: She is oriented to person, place, and time. She appears well-nourished. She appears distressed.  HENT:  Head: Normocephalic and atraumatic.  Tender over both frontal sinuses and the left maxillary sinus  Eyes: EOM are normal. Pupils are equal, round, and reactive to light. Right eye exhibits no discharge. Left eye exhibits no discharge.  Neck: Normal range of motion. Neck supple. No JVD present. No thyromegaly present.  Cardiovascular: Normal rate and regular rhythm.   Murmur heard. Pulmonary/Chest: Effort normal and breath sounds normal. No respiratory distress. She has no wheezes. She has no rales. She exhibits no tenderness.  Abdominal: Soft. There is no tenderness.  Musculoskeletal: Normal range of motion.  Lymphadenopathy:    She has no cervical adenopathy.  Neurological: She is alert and oriented to person, place, and time. She has normal reflexes.   Skin: Skin is warm and dry. She is not diaphoretic. No erythema.  Psychiatric: Judgment and thought content normal.  Somewhat anxious and tearful           Assessment & Plan:  1. Acute sinusitis, unspecified  - Levaquin 500 mg #21 - 1 qd  - Prednisone 20 mg #20 - pulse /taper  2. Obesity and low self esteem -  - Phentermine 37.5 mg # 30 x 3 rf Start 1/2 tab qd   ROV - 3 weeks

## 2013-12-25 ENCOUNTER — Ambulatory Visit (INDEPENDENT_AMBULATORY_CARE_PROVIDER_SITE_OTHER): Payer: 59 | Admitting: Cardiology

## 2013-12-25 ENCOUNTER — Encounter: Payer: Self-pay | Admitting: Cardiology

## 2013-12-25 VITALS — BP 132/84 | HR 66 | Ht 62.5 in | Wt 206.3 lb

## 2013-12-25 DIAGNOSIS — T783XXA Angioneurotic edema, initial encounter: Secondary | ICD-10-CM

## 2013-12-25 DIAGNOSIS — R600 Localized edema: Secondary | ICD-10-CM

## 2013-12-25 DIAGNOSIS — C9110 Chronic lymphocytic leukemia of B-cell type not having achieved remission: Secondary | ICD-10-CM

## 2013-12-25 DIAGNOSIS — G4733 Obstructive sleep apnea (adult) (pediatric): Secondary | ICD-10-CM

## 2013-12-25 DIAGNOSIS — I10 Essential (primary) hypertension: Secondary | ICD-10-CM

## 2013-12-25 DIAGNOSIS — R609 Edema, unspecified: Secondary | ICD-10-CM

## 2013-12-25 NOTE — Patient Instructions (Signed)
Increase HCTZ to 25 mg daily x 3 days. Avoid excessive fluid intake. Complete steroid dose pack. Dr Claiborne Billings Sept 2015 unless you need to be seen sooner.

## 2013-12-25 NOTE — Assessment & Plan Note (Signed)
Compliant with C-pap 

## 2013-12-25 NOTE — Assessment & Plan Note (Signed)
Controlled.  

## 2013-12-25 NOTE — Assessment & Plan Note (Signed)
Secondary to Prednisone dose pack and excessive H2O intake.

## 2013-12-25 NOTE — Progress Notes (Signed)
12/25/2013 Rebecca Bond   01-Nov-1946  [id number]  Primary Physicia MCKEOWN,WILLIAM DAVID, MD Primary Cardiologist: Dr Claiborne Billings  HPI:  68 y/o female with obesity, HTN, and sleep apnea, seen as an add on for edema in her legs. The pt has no history of CHF. Echo in 2010 showed normal LVF. Cath in 2003 showed no significant CAD. She tells me she was recently put on a dose pack and ABs for an URI. She has noted increasing edema in both legs the last 2-3 days. She denies SOB, orthopnea or PND. She has one more dose of Prednisone 20 mg to take. She admitted that she has been drinking 60-80 oz of water daily "to help flush the antibiotic through my kidneys".    Current Outpatient Prescriptions  Medication Sig Dispense Refill  . ALPRAZolam (XANAX) 0.5 MG tablet Take 0.25 mg by mouth daily as needed. For anxiety      . Ascorbic Acid (VITAMIN C) 1000 MG tablet Take 1,000 mg by mouth daily.      [name] Kitchen buPROPion (WELLBUTRIN XL) 300 MG 24 hr tablet Take 300 mg by mouth Daily.      [name] Kitchen BYSTOLIC 20 MG TABS Take 20 mg by mouth Daily. Patient takes 30mg .      . Cholecalciferol (VITAMIN D PO) Take 1,000 Units by mouth 3 (three) times daily.      [name] Kitchen ezetimibe (ZETIA) 10 MG tablet Take 1 tablet (10 mg total) by mouth at bedtime.  30 tablet  6  . fexofenadine (ALLEGRA) 180 MG tablet Take 180 mg by mouth daily.      . fish oil-omega-3 fatty acids 1000 MG capsule Take 1 g by mouth daily.      [name] Kitchen guaiFENesin (MUCINEX) 600 MG 12 hr tablet Take 600 mg by mouth daily.      . hydrochlorothiazide (HYDRODIURIL) 12.5 MG tablet Take 12.5 mg by mouth daily.      [name] Kitchen levofloxacin (LEVAQUIN) 500 MG tablet Take 1 tablet (500 mg total) by mouth daily.  10 tablet  0  . predniSONE (DELTASONE) 20 MG tablet Take 1 tablet (20 mg total) by mouth See admin instructions. 1 tab 3 x day for 3 days, then 1 tab 2 x day for 3 days, then 1 tab 1 x day for 5 days  20 tablet  0  . UNABLE TO FIND CPAP therapy.      . phentermine (ADIPEX-P) 37.5 MG tablet  1/2 to 1 tablet each morning for dieting and weight loss  30 tablet  3   No current facility-administered medications for this visit.    Allergies  Allergen Reactions  . Hyzaar [Losartan Potassium-Hctz] Other (See Comments)    "messed up my sodium counts" swells up lips.  [name] [Pseudoephedrine Hcl] Palpitations  . Biaxin [Clarithromycin]     GI Upset  . Celexa [Citalopram]   . Flexeril [Cyclobenzaprine]     "zombie-like" feeling  . Fosamax [Alendronate Sodium]     GI upset  . Iohexol Hives     Code: HIVES, Desc: pt states she broke out in hive 20 yrs ago from IV contrast.     . Losartan     Angioedema  . Meloxicam     GI upset  . Norvasc [Amlodipine] Swelling  . Pseudoephedrine     Palpitations  . Zoloft [Sertraline Hcl]     History   Social History  . Marital Status: Married    Spouse Name: N/A    Number of Children: 1  .  Years of Education: N/A   Occupational History  . CUST SERVICE [employer]   Social History Main Topics  . Smoking status: Former Smoker -- 0.75 packs/day for 4 years    Types: Cigarettes    Quit date: 12/05/1968  . Smokeless tobacco: Never Used  . Alcohol Use: 8.4 oz/week    14 Glasses of wine per week     Comment: 08/02/2012 "couple glasses wine q hs; last time 2-3 wk ago"  . Drug Use: No  . Sexual Activity: Not Currently   Other Topics Concern  . Not on file   Social History Narrative  . No narrative on file     Review of Systems: General: negative for chills, fever, night sweats or weight changes.  Cardiovascular: negative for chest pain, dyspnea on exertion, edema, orthopnea, palpitations, paroxysmal nocturnal dyspnea or shortness of breath Dermatological: negative for rash Respiratory: negative for cough or wheezing Urologic: negative for hematuria Abdominal: negative for nausea, vomiting, diarrhea, bright red blood per rectum, melena, or hematemesis Neurologic: negative for visual changes, syncope, or dizziness All  other systems reviewed and are otherwise negative except as noted above.    Blood pressure 132/84, pulse 66, height 5' 2.5" (1.588 m), weight 206 lb 4.8 oz (93.577 kg).  General appearance: alert, cooperative, no distress and moderately obese Neck: no carotid bruit and no JVD Lungs: clear to auscultation bilaterally Heart: regular rate and rhythm Extremities: 1+ pre tibial edema bilat  EKG NSR  ASSESSMENT AND PLAN:   Sleep apnea, obstructive Compliant with C-pap  Morbid obesity .  Edema of both legs Secondary to Prednisone dose pack and excessive H2O intake.  CLL (chronic lymphocytic leukemia) .  Angioedema secondary to ACE/ARB .  Benign essential HTN Controlled   PLAN  Complete dose pack. Drink water only when thirsty. Increase HCTZ 10 25 mg daily x 3 days.  Ovide Dusek KPA-C 12/25/2013 5:18 PM

## 2013-12-26 ENCOUNTER — Telehealth: Payer: Self-pay

## 2013-12-26 NOTE — Telephone Encounter (Signed)
Pt called c/o side effects since starting levoflox/prednisone this weekend. Pt states she has swelling from knees to ankles. Per McK, pt is advised to d/c levoflox/prednisone and double HCTZ (2 tab qd) for several days. Lmom to pt.

## 2014-01-05 DIAGNOSIS — K219 Gastro-esophageal reflux disease without esophagitis: Secondary | ICD-10-CM | POA: Insufficient documentation

## 2014-01-05 DIAGNOSIS — G4733 Obstructive sleep apnea (adult) (pediatric): Secondary | ICD-10-CM | POA: Insufficient documentation

## 2014-01-05 DIAGNOSIS — R7309 Other abnormal glucose: Secondary | ICD-10-CM | POA: Insufficient documentation

## 2014-01-05 DIAGNOSIS — F325 Major depressive disorder, single episode, in full remission: Secondary | ICD-10-CM | POA: Insufficient documentation

## 2014-01-08 ENCOUNTER — Ambulatory Visit (INDEPENDENT_AMBULATORY_CARE_PROVIDER_SITE_OTHER): Payer: 59 | Admitting: Internal Medicine

## 2014-01-08 ENCOUNTER — Encounter: Payer: Self-pay | Admitting: Internal Medicine

## 2014-01-08 VITALS — BP 108/52 | HR 72 | Temp 97.9°F | Resp 18 | Wt 206.4 lb

## 2014-01-08 DIAGNOSIS — R609 Edema, unspecified: Secondary | ICD-10-CM

## 2014-01-08 DIAGNOSIS — I10 Essential (primary) hypertension: Secondary | ICD-10-CM | POA: Insufficient documentation

## 2014-01-08 MED ORDER — FUROSEMIDE 40 MG PO TABS: 40.0000 mg | ORAL_TABLET | Freq: Two times a day (BID) | ORAL | Status: DC

## 2014-01-08 NOTE — Progress Notes (Signed)
   Subjective:    Patient ID: Rebecca Bond, female    DOB: 08-Aug-1946, 68 y.o.   MRN: [id number]  HPI nBrfenda returns for F/U of adding Phentermine for weight loss as 3 weeks Rebecca Bond shared her depression related to her compulsive overeating and progressive weight gain. Of note Rebecca Bond has lost about 1 # in the interim. Sshe also relates a progressive dependent edema of her legs despite doubling her HCTZ 12.5 mg tabs. Rebecca Bond denies any cardiac Sx's. Rebecca Bond does report apathy about her self image regarding her weight gain. Rebecca Bond has good reality testing and denies any suicidial tendencies . Rebecca Bond is reading the recently recommended book  'The END of DIETING'.  Review of Systems negative 12 systems reviewed except as above.    Objective:   Physical Exam HEENT - neg Neck - supple. No JVD. Car 2+/2+ & no bruits. Chest - clear. Cor - RRR w/o sig MGR .  Ext - 1-2 (+) pretibial edema   Assessment & Plan:   Edema - suspect dietary salt related - will d.c HCTZ and try Lasix 40  Qd/bid and see ROV 2 wks to reassess

## 2014-01-08 NOTE — Patient Instructions (Signed)
Sodium-Controlled Diet Sodium is a mineral. It is found in many foods. Sodium may be found naturally or added during the making of a food. The most common form of sodium is salt, which is made up of sodium and chloride. Reducing your sodium intake involves changing your eating habits. The following guidelines will help you reduce the sodium in your diet:  Stop using the salt shaker.  Use salt sparingly in cooking and baking.  Substitute with sodium-free seasonings and spices.  Do not use a salt substitute (potassium chloride) without your caregiver's permission.  Include a variety of fresh, unprocessed foods in your diet.  Limit the use of processed and convenience foods that are high in sodium. USE THE FOLLOWING FOODS SPARINGLY: Breads/Starches  Commercial bread stuffing, commercial pancake or waffle mixes, coating mixes. Waffles. Croutons. Prepared (boxed or frozen) potato, rice, or noodle mixes that contain salt or sodium. Salted French fries or hash browns. Salted popcorn, breads, crackers, chips, or snack foods. Vegetables  Vegetables canned with salt or prepared in cream, butter, or cheese sauces. Sauerkraut. Tomato or vegetable juices canned with salt.  Fresh vegetables are allowed if rinsed thoroughly. Fruit  Fruit is okay to eat. Meat and Meat Substitutes  Salted or smoked meats, such as bacon or Canadian bacon, chipped or corned beef, hot dogs, salt pork, luncheon meats, pastrami, ham, or sausage. Canned or smoked fish, poultry, or meat. Processed cheese or cheese spreads, blue or Roquefort cheese. Battered or frozen fish products. Prepared spaghetti sauce. Baked beans. Reuben sandwiches. Salted nuts. Caviar. Milk  Limit buttermilk to 1 cup per week. Soups and Combination Foods  Bouillon cubes, canned or dried soups, broth, consomm. Convenience (frozen or packaged) dinners with more than 600 mg sodium. Pot pies, pizza, Asian food, fast food cheeseburgers, and specialty  sandwiches. Desserts and Sweets  Regular (salted) desserts, pie, commercial fruit snack pies, commercial snack cakes, canned puddings.  Eat desserts and sweets in moderation. Fats and Oils  Gravy mixes or canned gravy. No more than 1 to 2 tbs of salad dressing. Chip dips.  Eat fats and oils in moderation. Beverages  See those listed under the vegetables and milk groups. Condiments  Ketchup, mustard, meat sauces, salsa, regular (salted) and lite soy sauce or mustard. Dill pickles, olives, meat tenderizer. Prepared horseradish or pickle relish. Dutch-processed cocoa. Baking powder or baking soda used medicinally. Worcestershire sauce. "Light" salt. Salt substitute, unless approved by your caregiver. Document Released: 05/13/2002 Document Revised: 02/13/2012 Document Reviewed: 12/14/2009 Exit[employer] Patient Information 2014 [employer].  

## 2014-01-10 ENCOUNTER — Telehealth: Payer: Self-pay | Admitting: *Deleted

## 2014-01-10 NOTE — Telephone Encounter (Signed)
Patient called. Taking 2 fluid tabs daily- 1 at 7 AM and 1 at 7 PM. States late tab keeps her up at night.  Per Dr Melford Aase, take med at 7 AM  and second dose at 2-3 PM.  Patient aware.

## 2014-01-12 ENCOUNTER — Other Ambulatory Visit: Payer: Self-pay | Admitting: Internal Medicine

## 2014-01-13 ENCOUNTER — Other Ambulatory Visit: Payer: 59

## 2014-01-13 DIAGNOSIS — R5381 Other malaise: Secondary | ICD-10-CM

## 2014-01-13 DIAGNOSIS — R5383 Other fatigue: Principal | ICD-10-CM

## 2014-01-13 LAB — BASIC METABOLIC PANEL WITH GFR
BUN: 15 mg/dL (ref 6–23)
CO2: 25 mEq/L (ref 19–32)
Calcium: 9.1 mg/dL (ref 8.4–10.5)
Chloride: 100 mEq/L (ref 96–112)
Creat: 0.89 mg/dL (ref 0.50–1.10)
GFR, EST AFRICAN AMERICAN: 78 mL/min
GFR, Est Non African American: 67 mL/min
Glucose, Bld: 119 mg/dL — ABNORMAL HIGH (ref 70–99)
Potassium: 3.8 mEq/L (ref 3.5–5.3)
Sodium: 138 mEq/L (ref 135–145)

## 2014-01-13 LAB — CBC WITH DIFFERENTIAL/PLATELET
Basophils Absolute: 0.1 10*3/uL (ref 0.0–0.1)
Basophils Relative: 0 % (ref 0–1)
EOS ABS: 0.2 10*3/uL (ref 0.0–0.7)
EOS PCT: 1 % (ref 0–5)
HCT: 35.5 % — ABNORMAL LOW (ref 36.0–46.0)
Hemoglobin: 12.4 g/dL (ref 12.0–15.0)
LYMPHS ABS: 37.2 10*3/uL — AB (ref 0.7–4.0)
Lymphocytes Relative: 89 % — ABNORMAL HIGH (ref 12–46)
MCH: 32.6 pg (ref 26.0–34.0)
MCHC: 34.9 g/dL (ref 30.0–36.0)
MCV: 93.4 fL (ref 78.0–100.0)
Monocytes Absolute: 0.6 10*3/uL (ref 0.1–1.0)
Monocytes Relative: 1 % — ABNORMAL LOW (ref 3–12)
Neutro Abs: 3.6 10*3/uL (ref 1.7–7.7)
Neutrophils Relative %: 9 % — ABNORMAL LOW (ref 43–77)
PLATELETS: 137 10*3/uL — AB (ref 150–400)
RBC: 3.8 MIL/uL — ABNORMAL LOW (ref 3.87–5.11)
RDW: 13.4 % (ref 11.5–15.5)
WBC: 51.6 10*3/uL — ABNORMAL HIGH (ref 4.0–10.5)

## 2014-01-13 LAB — MAGNESIUM: Magnesium: 1.7 mg/dL (ref 1.5–2.5)

## 2014-01-22 ENCOUNTER — Ambulatory Visit: Payer: Self-pay | Admitting: Internal Medicine

## 2014-01-26 ENCOUNTER — Other Ambulatory Visit: Payer: Self-pay | Admitting: Internal Medicine

## 2014-01-30 ENCOUNTER — Ambulatory Visit: Payer: Self-pay | Admitting: Internal Medicine

## 2014-02-04 ENCOUNTER — Encounter: Payer: Self-pay | Admitting: Oncology

## 2014-02-10 ENCOUNTER — Ambulatory Visit (INDEPENDENT_AMBULATORY_CARE_PROVIDER_SITE_OTHER): Payer: 59 | Admitting: Internal Medicine

## 2014-02-10 ENCOUNTER — Encounter: Payer: Self-pay | Admitting: Internal Medicine

## 2014-02-10 VITALS — BP 126/86 | HR 64 | Temp 97.3°F | Resp 16 | Ht 62.5 in | Wt 198.8 lb

## 2014-02-10 DIAGNOSIS — R609 Edema, unspecified: Secondary | ICD-10-CM

## 2014-02-10 DIAGNOSIS — Z79899 Other long term (current) drug therapy: Secondary | ICD-10-CM

## 2014-02-10 DIAGNOSIS — I10 Essential (primary) hypertension: Secondary | ICD-10-CM

## 2014-02-10 LAB — BASIC METABOLIC PANEL WITH GFR
BUN: 19 mg/dL (ref 6–23)
CALCIUM: 9.4 mg/dL (ref 8.4–10.5)
CO2: 26 meq/L (ref 19–32)
Chloride: 96 mEq/L (ref 96–112)
Creat: 0.82 mg/dL (ref 0.50–1.10)
GFR, EST AFRICAN AMERICAN: 86 mL/min
GFR, Est Non African American: 74 mL/min
Glucose, Bld: 81 mg/dL (ref 70–99)
Potassium: 3.6 mEq/L (ref 3.5–5.3)
SODIUM: 132 meq/L — AB (ref 135–145)

## 2014-02-10 NOTE — Progress Notes (Signed)
Subjective:    Patient ID: [name], female    DOB: [birth year], 68 y.o.   MRN: [id number]  HPI  HPI Rebecca Bond returns for 1 month F/U of switching HCTZ to Lasix 40 mg 1-2 x/day for worsening edema.  She also shared her depression related to her compulsive overeating and progressive weight gain. She was given an Rx for Phentermine which she has not started yet. Of note she has lost about 8 # in the interim and reports an improvement in the dependent edema of her legs and consequently tapering her Lasix down to 1/2 tab daily. She denies any cardiac Sx's, muscle cramps, dizziness, etc. She still reports apathy about her self image regarding her weight gain. She has good reality testing and denies any suicidial tendencies . She is still  reading the book 'The END of DIETING' by Dr Excell Seltzer.    Medication List       This list is accurate as of: 02/10/14  7:08 PM.  Always use your most recent med list.               ALPRAZolam 0.5 MG tablet  Commonly known as:  XANAX  Take 0.25 mg by mouth daily as needed. For anxiety     buPROPion 300 MG 24 hr tablet  Commonly known as:  WELLBUTRIN XL  TAKE 1 TABLET BY MOUTH DAILY FOR MOOD AS DIRECTED     BYSTOLIC 20 MG Tabs  Generic drug:  Nebivolol HCl  Take 20 mg by mouth Daily. Patient takes 30mg .     ezetimibe 10 MG tablet  Commonly known as:  ZETIA  Take 1 tablet (10 mg total) by mouth at bedtime.     fexofenadine 180 MG tablet  Commonly known as:  ALLEGRA  Take 180 mg by mouth daily.     fish oil-omega-3 fatty acids 1000 MG capsule  Take 1 g by mouth daily.     furosemide 40 MG tablet  Commonly known as:  LASIX  Take 1 tablet (40 mg total) by mouth 2 (two) times daily. For fluid and swelling     guaiFENesin 600 MG 12 hr tablet  Commonly known as:  MUCINEX  Take 600 mg by mouth daily. PRN     OMNARIS 50 MCG/ACT nasal spray  Generic drug:  ciclesonide  USE 1 TO 2 SPRAYS IN EACH NOSTRIL EVERY DAY     phentermine 37.5 MG  tablet  Commonly known as:  ADIPEX-P  1/2 to 1 tablet each morning for dieting and weight loss     vitamin C 1000 MG tablet  Take 1,000 mg by mouth daily.     VITAMIN D PO  Take 1,000 Units by mouth 3 (three) times daily.        Allergies  Allergen Reactions  . Hyzaar [Losartan Potassium-Hctz] Other (See Comments)    "messed up my sodium counts" swells up lips.  [name] [Pseudoephedrine Hcl] Palpitations  . Biaxin [Clarithromycin]     GI Upset  . Celexa [Citalopram]   . Flexeril [Cyclobenzaprine]     "zombie-like" feeling  . Fosamax [Alendronate Sodium]     GI upset  . Iohexol Hives     Code: HIVES, Desc: pt states she broke out in hive 20 yrs ago from IV contrast.     . Losartan     Angioedema  . Meloxicam     GI upset  . Norvasc [Amlodipine] Swelling  . Pseudoephedrine     Palpitations  .  Zoloft [Sertraline Hcl]      Past Medical History  Diagnosis Date  . Benign essential HTN 11/22/2011  . CLL (chronic lymphocytic leukemia)   . Pneumonia 01/2010  . History of bronchitis 08/02/2012    "maybe once/yr if that much; last time 01/2010"  . Recurrent sinus infections 08/02/2012  . 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 High cholesterol   . 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 Prediabetes   . Anxiety   . Depression   . GERD (gastroesophageal reflux disease)   . OSA (obstructive sleep apnea)     Review of Systems Neg 12 pt systems reviewexcept as above.  Filed Vitals:   02/10/14 1703  BP: 126/86  Pulse: 64  Temp: 97.3 F (36.3 C)  Resp: 16    Objective:   Physical Exam HEENT - Eac's patent. TM's Nl.EOM's full. PERRLA. NasoOroPharynx clear. Neck - supple. Nl Thyroid. No bruits nodes JVD Chest - Clear equal BS Cor - Nl HS. RRR w/o sig MGR. PP 1(+) No edema. Abd - No palpable organomegaly, masses or tenderness. BS nl. MS- FROM. w/o deformities. Muscle power tone and bulk Nl. Gait Nl. Neuro - No obvious Cr N abnormalities.  Sensory, motor and Cerebellar functions appear Nl w/o focal abnormalities.  Assessment & Plan:  1. HTN (hypertension)  2. Edema  3. Encounter for long-term (current) use of other medications  - BASIC METABOLIC PANEL WITH GFR

## 2014-02-17 ENCOUNTER — Other Ambulatory Visit: Payer: Self-pay | Admitting: Emergency Medicine

## 2014-02-17 MED ORDER — FLUTICASONE PROPIONATE 50 MCG/ACT NA SUSP: 1.0000 | Freq: Every day | NASAL | Status: DC

## 2014-02-24 ENCOUNTER — Other Ambulatory Visit: Payer: Self-pay | Admitting: *Deleted

## 2014-02-27 ENCOUNTER — Telehealth: Payer: Self-pay | Admitting: Hematology and Oncology

## 2014-02-27 ENCOUNTER — Telehealth: Payer: Self-pay | Admitting: *Deleted

## 2014-02-27 NOTE — Telephone Encounter (Signed)
Pt left VM states she has sinus infection and going to see her MD today.  States she usually is started on prednisone and antibiotics for this.  Asks if she should keep her lab appt as scheduled tomorrow?   Informed Dr. Alvy Bimler and she instructs to delay lab/ office visit for 3 weeks. POF sent.  Left VM for pt informing of above and to expect a call from our scheduling dept.[name] Kitchen

## 2014-02-27 NOTE — Telephone Encounter (Signed)
pt returned my call and will keep appts NG - pt called previously to change. per 3/24 pof move appts out 3wk pt made aware of order move appts and was given new appts for 4/17 lb and 4/22 NG. dates/times per pt.

## 2014-02-28 ENCOUNTER — Other Ambulatory Visit: Payer: 59

## 2014-03-03 ENCOUNTER — Telehealth: Payer: Self-pay | Admitting: *Deleted

## 2014-03-03 NOTE — Telephone Encounter (Signed)
Just put her on the April  Sleep schedule. [facility] will be here that day. Put her in the spot that we took that patient out of @ 9.

## 2014-03-03 NOTE — Telephone Encounter (Signed)
Pt was calling because she needed to make an appointment to have her CPAP checked. It was due in March. She has advanced. She wants to know if she can just bring in her card.  Tk

## 2014-03-04 ENCOUNTER — Ambulatory Visit: Payer: 59 | Admitting: Hematology and Oncology

## 2014-03-17 ENCOUNTER — Telehealth: Payer: Self-pay | Admitting: Hematology and Oncology

## 2014-03-17 NOTE — Telephone Encounter (Signed)
PT CALLED TO R/S 4/17 LAB TO 4/20. PT HAS NEW D/T. F/U REMAINS 4/22.

## 2014-03-21 ENCOUNTER — Other Ambulatory Visit: Payer: 59

## 2014-03-21 ENCOUNTER — Other Ambulatory Visit: Payer: Self-pay | Admitting: Hematology and Oncology

## 2014-03-21 DIAGNOSIS — D801 Nonfamilial hypogammaglobulinemia: Secondary | ICD-10-CM

## 2014-03-21 DIAGNOSIS — C9110 Chronic lymphocytic leukemia of B-cell type not having achieved remission: Secondary | ICD-10-CM

## 2014-03-24 ENCOUNTER — Other Ambulatory Visit (HOSPITAL_BASED_OUTPATIENT_CLINIC_OR_DEPARTMENT_OTHER): Payer: 59

## 2014-03-24 DIAGNOSIS — C9110 Chronic lymphocytic leukemia of B-cell type not having achieved remission: Secondary | ICD-10-CM

## 2014-03-24 DIAGNOSIS — D801 Nonfamilial hypogammaglobulinemia: Secondary | ICD-10-CM

## 2014-03-24 LAB — COMPREHENSIVE METABOLIC PANEL (CC13)
ALBUMIN: 3.9 g/dL (ref 3.5–5.0)
ALK PHOS: 87 U/L (ref 40–150)
ALT: 29 U/L (ref 0–55)
ANION GAP: 12 meq/L — AB (ref 3–11)
AST: 25 U/L (ref 5–34)
BUN: 21.2 mg/dL (ref 7.0–26.0)
CHLORIDE: 99 meq/L (ref 98–109)
CO2: 24 mEq/L (ref 22–29)
Calcium: 9.5 mg/dL (ref 8.4–10.4)
Creatinine: 0.8 mg/dL (ref 0.6–1.1)
Glucose: 96 mg/dl (ref 70–140)
POTASSIUM: 4 meq/L (ref 3.5–5.1)
SODIUM: 134 meq/L — AB (ref 136–145)
TOTAL PROTEIN: 6.4 g/dL (ref 6.4–8.3)
Total Bilirubin: 0.47 mg/dL (ref 0.20–1.20)

## 2014-03-24 LAB — CBC WITH DIFFERENTIAL/PLATELET
BASO%: 0 % (ref 0.0–2.0)
Basophils Absolute: 0.1 10*3/uL (ref 0.0–0.1)
EOS%: 0.2 % (ref 0.0–7.0)
Eosinophils Absolute: 0.2 10*3/uL (ref 0.0–0.5)
HCT: 35.6 % (ref 34.8–46.6)
HGB: 11.3 g/dL — ABNORMAL LOW (ref 11.6–15.9)
LYMPH%: 90.8 % — ABNORMAL HIGH (ref 14.0–49.7)
MCH: 32.2 pg (ref 25.1–34.0)
MCHC: 31.8 g/dL (ref 31.5–36.0)
MCV: 101.2 fL — ABNORMAL HIGH (ref 79.5–101.0)
MONO#: 1.9 10*3/uL — AB (ref 0.1–0.9)
MONO%: 1.5 % (ref 0.0–14.0)
NEUT%: 7.5 % — ABNORMAL LOW (ref 38.4–76.8)
NEUTROS ABS: 9.3 10*3/uL — AB (ref 1.5–6.5)
Platelets: 155 10*3/uL (ref 145–400)
RBC: 3.51 10*6/uL — AB (ref 3.70–5.45)
RDW: 14.6 % — ABNORMAL HIGH (ref 11.2–14.5)
WBC: 123.6 10*3/uL — AB (ref 3.9–10.3)
lymph#: 112.1 10*3/uL — ABNORMAL HIGH (ref 0.9–3.3)

## 2014-03-24 LAB — LACTATE DEHYDROGENASE (CC13): LDH: 225 U/L (ref 125–245)

## 2014-03-24 LAB — TECHNOLOGIST REVIEW

## 2014-03-25 LAB — IGG, IGA, IGM
IgA: 30 mg/dL — ABNORMAL LOW (ref 69–380)
IgG (Immunoglobin G), Serum: 360 mg/dL — ABNORMAL LOW (ref 690–1700)
IgM, Serum: 21 mg/dL — ABNORMAL LOW (ref 52–322)

## 2014-03-26 ENCOUNTER — Telehealth: Payer: Self-pay | Admitting: Hematology and Oncology

## 2014-03-26 ENCOUNTER — Ambulatory Visit (HOSPITAL_BASED_OUTPATIENT_CLINIC_OR_DEPARTMENT_OTHER): Payer: 59 | Admitting: Hematology and Oncology

## 2014-03-26 ENCOUNTER — Encounter: Payer: Self-pay | Admitting: Hematology and Oncology

## 2014-03-26 VITALS — BP 176/67 | HR 62 | Temp 97.5°F | Resp 20 | Ht 62.5 in | Wt 201.8 lb

## 2014-03-26 DIAGNOSIS — D801 Nonfamilial hypogammaglobulinemia: Secondary | ICD-10-CM

## 2014-03-26 DIAGNOSIS — C9110 Chronic lymphocytic leukemia of B-cell type not having achieved remission: Secondary | ICD-10-CM

## 2014-03-26 DIAGNOSIS — D649 Anemia, unspecified: Secondary | ICD-10-CM

## 2014-03-26 MED ORDER — CIPROFLOXACIN HCL 500 MG PO TABS
500.0000 mg | ORAL_TABLET | Freq: Two times a day (BID) | ORAL | Status: DC
Start: 2014-03-26 — End: 2014-04-16

## 2014-03-26 NOTE — Progress Notes (Signed)
[facility] progress notes  Patient Care Team: [name], MD as PCP - General  CHIEF COMPLAINTS/PURPOSE OF VISIT:  CLL with hypogammaglobulinemia  HISTORY OF PRESENTING ILLNESS:  Rebecca Bond 68 y.o. female was transferred to my care after her prior physician has left.  I reviewed the patient's records extensive and collaborated the history with the patient. Summary of her history is as follows: She was diagnosed with stage I chronic lymphocytic leukemia diagnosed in December 2009 when a routine mammogram showed an enlarged left axillary lymph node. Biopsy consistent with CLL. She has no clinically palpable lymphadenopathy, no anemia, no thrombocytopenia, and has not required any treatment to date. It has been hard to follow the trend in her white blood count since she takes frequent courses of steroids for flareups of recurrent sinusitis and bronchitis.   She complained of fatigue. She had daily night sweats. Denies any new lymphadenopathy. She denies any recent fever, chills, or abnormal weight loss     MEDICAL HISTORY:  Past Medical History  Diagnosis Date  . Benign essential HTN 11/22/2011  . CLL (chronic lymphocytic leukemia)   . Pneumonia 01/2010  . History of bronchitis 08/02/2012    "maybe once/yr if that much; last time 01/2010"  . Recurrent sinus infections 08/02/2012  . 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 High cholesterol   . 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 Prediabetes   . Anxiety   . Depression   . GERD (gastroesophageal reflux disease)   . OSA (obstructive sleep apnea)     SURGICAL HISTORY: Past Surgical History  Procedure Laterality Date  . Cholecystectomy  1985  . Tonsillectomy and adenoidectomy  1950's  . Breast surgery    . Lymph node biopsy      "determined I had CLL"  . Temporomandibular joint arthroplasty  1980's  . Functional endoscopic sinus surgery  1990's    "cause I kept having  sinus infections"  . Abdominal hysterectomy  1980's    "endometrosis"    SOCIAL HISTORY: History   Social History  . Marital Status: Married    Spouse Name: N/A    Number of Children: 1  . Years of Education: N/A   Occupational History  . CUST SERVICE [employer]   Social History Main Topics  . Smoking status: Former Smoker -- 0.75 packs/day for 4 years    Types: Cigarettes    Quit date: 12/05/1968  . Smokeless tobacco: Never Used  . Alcohol Use: 8.4 oz/week    14 Glasses of wine per week     Comment: 08/02/2012 "couple glasses wine q hs; last time 2-3 wk ago"  . Drug Use: No  . Sexual Activity: Not Currently   Other Topics Concern  . Not on file   Social History Narrative  . No narrative on file    FAMILY HISTORY: Family History  Problem Relation Age of Onset  . Parkinson's disease Mother   . Hypertension Mother   . Hypertension Maternal Grandmother   . Heart attack Maternal Grandmother     Mild    ALLERGIES:  is allergic to hyzaar; sudafed; biaxin; celexa; flexeril; fosamax; iohexol; losartan; meloxicam; norvasc; pseudoephedrine; and zoloft.  MEDICATIONS:  Current Outpatient Prescriptions  Medication Sig Dispense Refill  . ALPRAZolam (XANAX) 0.5 MG tablet Take 0.25 mg by mouth daily as needed. For anxiety      . Ascorbic Acid (VITAMIN C) 1000 MG tablet Take 1,000 mg by mouth daily.      [name] Kitchen  buPROPion (WELLBUTRIN XL) 300 MG 24 hr tablet TAKE 1 TABLET BY MOUTH DAILY FOR MOOD AS DIRECTED  90 tablet  0  . BYSTOLIC 20 MG TABS Take 20 mg by mouth Daily. Patient takes 30mg .      . Cholecalciferol (VITAMIN D PO) Take 1,000 Units by mouth 3 (three) times daily.      [name] Kitchen ezetimibe (ZETIA) 10 MG tablet Take 1 tablet (10 mg total) by mouth at bedtime.  30 tablet  6  . fish oil-omega-3 fatty acids 1000 MG capsule Take 1 g by mouth daily.      . furosemide (LASIX) 40 MG tablet Take 1 tablet (40 mg total) by mouth 2 (two) times daily. For fluid and swelling  60 tablet  99   . hydrochlorothiazide (HYDRODIURIL) 12.5 MG tablet Take 12.5 mg by mouth daily.      . mometasone (NASONEX) 50 MCG/ACT nasal spray Place 2 sprays into the nose daily.      . ciprofloxacin (CIPRO) 500 MG tablet Take 1 tablet (500 mg total) by mouth 2 (two) times daily.  20 tablet  0   No current facility-administered medications for this visit.    REVIEW OF SYSTEMS:   Eyes: Denies blurriness of vision, double vision or watery eyes Ears, nose, mouth, throat, and face: Denies mucositis or sore throat Respiratory: Denies cough, dyspnea or wheezes Cardiovascular: Denies palpitation, chest discomfort or lower extremity swelling Gastrointestinal:  Denies nausea, heartburn or change in bowel habits Skin: Denies abnormal skin rashes Lymphatics: Denies new lymphadenopathy or easy bruising Neurological:Denies numbness, tingling or new weaknesses Behavioral/Psych: Mood is stable, no new changes  All other systems were reviewed with the patient and are negative.  PHYSICAL EXAMINATION: ECOG PERFORMANCE STATUS: 1 - Symptomatic but completely ambulatory  Filed Vitals:   03/26/14 1509  BP: 176/67  Pulse: 62  Temp: 97.5 F (36.4 C)  Resp: 20   Filed Weights   03/26/14 1509  Weight: 201 lb 12.8 oz (91.536 kg)    GENERAL:alert, no distress and comfortable. She is mildly obese SKIN: skin color, texture, turgor are normal, no rashes or significant lesions EYES: normal, conjunctiva are pink and non-injected, sclera clear OROPHARYNX:no exudate, normal lips, buccal mucosa, and tongue  NECK: supple, thyroid normal size, non-tender, without nodularity LYMPH:  no palpable lymphadenopathy in the cervical, axillary or inguinal LUNGS: clear to auscultation and percussion with normal breathing effort HEART: regular rate & rhythm and no murmurs without lower extremity edema ABDOMEN:abdomen soft, non-tender and normal bowel sounds Musculoskeletal:no cyanosis of digits and no clubbing  PSYCH: alert &  oriented x 3 with fluent speech NEURO: no focal motor/sensory deficits  LABORATORY DATA:  I have reviewed the data as listed Lab Results  Component Value Date   WBC 123.6* 03/24/2014   HGB 11.3* 03/24/2014   HCT 35.6 03/24/2014   MCV 101.2* 03/24/2014   PLT 155 03/24/2014    Recent Labs  08/30/13 1604 01/13/14 1122 02/10/14 1757 03/24/14 1603  NA 137 138 132* 134*  K 3.8 3.8 3.6 4.0  CL  --  100 96  --   CO2 24 25 26 24   GLUCOSE 93 119* 81 96  BUN 25.7 15 19  21.2  CREATININE 0.8 0.89 0.82 0.8  CALCIUM 9.8 9.1 9.4 9.5  GFRNONAA  --  67 74  --   GFRAA  --  78 86  --   PROT 6.7  --   --  6.4  ALBUMIN 3.9  --   --  3.9  AST 22  --   --  25  ALT 25  --   --  29  ALKPHOS 68  --   --  87  BILITOT 0.65  --   --  0.47   ASSESSMENT & PLAN:  #1 CLL I am very concerned about high white count and new onset of anemia. She also have daily night sweats. I recommend consideration for treatment soon. She has a trip planned in June. I don't think it is unreasonable for her to proceed with that and I plan to see her back in 2 months with history, physical examination, as well as blood work. #2 anemia I suspect this is due to underlying disease. She is not symptomatic. I recommend observation #3 severe hypogammaglobulinemia with recurrent infection I recommend hand hygiene and avoiding contacts with sick acquaintances. I gave her a prescription of antibiotics to take in case of emergency situations when she is out-of-town during her upcoming trip. She agreed.  Orders Placed This Encounter  Procedures  . CBC & Diff and Retic    Standing Status: Future     Number of Occurrences:      Standing Expiration Date: 03/26/2015  . Lactate dehydrogenase    Standing Status: Future     Number of Occurrences:      Standing Expiration Date: 03/26/2015  . Comprehensive metabolic panel    Standing Status: Future     Number of Occurrences:      Standing Expiration Date: 03/26/2015  . FISH, Peripheral  Blood    Standing Status: Future     Number of Occurrences:      Standing Expiration Date: 03/26/2015  . IgG, IgA, IgM    Standing Status: Future     Number of Occurrences:      Standing Expiration Date: 03/26/2015    All questions were answered. The patient knows to call the clinic with any problems, questions or concerns. I spent 40 minutes counseling the patient face to face. The total time spent in the appointment was 55 minutes and more than 50% was on counseling.     Heath Lark, MD 03/26/2014 8:15 PM

## 2014-03-26 NOTE — Telephone Encounter (Signed)
gv adn printed appt sched and avs for opt for June... °

## 2014-03-27 ENCOUNTER — Telehealth: Payer: Self-pay | Admitting: *Deleted

## 2014-03-27 NOTE — Telephone Encounter (Signed)
Pt left a voice mail stating she is scheduled to come back in June for labs, but is anxious and would feel more comfortable if she could have labs in May instead..(late afternoon appointment)

## 2014-03-31 ENCOUNTER — Other Ambulatory Visit: Payer: Self-pay | Admitting: *Deleted

## 2014-03-31 MED ORDER — NEBIVOLOL HCL 20 MG PO TABS: 30.0000 mg | ORAL_TABLET | Freq: Every day | ORAL | Status: DC

## 2014-03-31 NOTE — Telephone Encounter (Signed)
Rx refill sent to patient pharamcy 

## 2014-04-01 ENCOUNTER — Telehealth: Payer: Self-pay | Admitting: *Deleted

## 2014-04-01 NOTE — Telephone Encounter (Signed)
Pt called last week to request labs be done sooner than June as scheduled.  She requested appt in May. Dr. Alvy Bimler says ok to do CBC in May if pt requests.  Left VM for pt to return nurse's call.

## 2014-04-02 ENCOUNTER — Telehealth: Payer: Self-pay | Admitting: *Deleted

## 2014-04-02 DIAGNOSIS — C9110 Chronic lymphocytic leukemia of B-cell type not having achieved remission: Secondary | ICD-10-CM

## 2014-04-02 NOTE — Telephone Encounter (Signed)
Pt requests lab appt prior to going on vacation at beginning of June.  She requests late in the day as possible.  Ok per Dr. Alvy Bimler for pt to have CBC checked again prior to vacation.  Informed pt to expect call from scheduler.  Will request 4 pm appt at end of May.  She verbalized understanding.

## 2014-04-03 ENCOUNTER — Telehealth: Payer: Self-pay | Admitting: Cardiovascular Disease

## 2014-04-03 ENCOUNTER — Telehealth: Payer: Self-pay | Admitting: Hematology and Oncology

## 2014-04-03 MED ORDER — NEBIVOLOL HCL 20 MG PO TABS: 30.0000 mg | ORAL_TABLET | Freq: Every day | ORAL | Status: DC

## 2014-04-03 NOTE — Telephone Encounter (Signed)
Need refill on Bystolic 10mg  #90.

## 2014-04-03 NOTE — Telephone Encounter (Signed)
Rx refill had been sent to different pharmacy, Refill was sent into [facility]

## 2014-04-03 NOTE — Telephone Encounter (Signed)
lmonvm advising the pt of her lab appt on 03/31/2014

## 2014-04-09 ENCOUNTER — Ambulatory Visit: Payer: 59 | Admitting: Cardiology

## 2014-04-14 ENCOUNTER — Telehealth: Payer: Self-pay | Admitting: *Deleted

## 2014-04-14 NOTE — Telephone Encounter (Signed)
Informed pt to start antibiotic as prescribed by Dr. Alvy Bimler and call us back on Thursday to let us know how she is feeling.  Pt verbalized understanding and also reports she has appt to see her PCP this Wednesday.

## 2014-04-14 NOTE — Telephone Encounter (Signed)
Pt reports sore throat and headache for past 2 days.  Denies any fevers.  Asks if she should start taking antibiotic Dr. Alvy Bimler prescribed?

## 2014-04-14 NOTE — Telephone Encounter (Signed)
Yes, and to call back Thursday to see if she is better

## 2014-04-15 ENCOUNTER — Telehealth: Payer: Self-pay | Admitting: *Deleted

## 2014-04-15 NOTE — Telephone Encounter (Signed)
Pt states started taking antibiotic yesterday and feeling better. 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  Letter signed by Dr. Alvy Bimler and faxed to Parthenia Ames at fax [phone].  Pt notified.

## 2014-04-16 ENCOUNTER — Ambulatory Visit: Payer: Self-pay | Admitting: Internal Medicine

## 2014-04-16 ENCOUNTER — Ambulatory Visit (INDEPENDENT_AMBULATORY_CARE_PROVIDER_SITE_OTHER): Payer: 59 | Admitting: Internal Medicine

## 2014-04-16 ENCOUNTER — Encounter: Payer: Self-pay | Admitting: Internal Medicine

## 2014-04-16 VITALS — BP 128/70 | HR 68 | Temp 98.2°F | Resp 18 | Ht 62.5 in | Wt 198.0 lb

## 2014-04-16 DIAGNOSIS — R5383 Other fatigue: Principal | ICD-10-CM

## 2014-04-16 DIAGNOSIS — Z79899 Other long term (current) drug therapy: Secondary | ICD-10-CM

## 2014-04-16 DIAGNOSIS — R109 Unspecified abdominal pain: Secondary | ICD-10-CM

## 2014-04-16 DIAGNOSIS — R7309 Other abnormal glucose: Secondary | ICD-10-CM

## 2014-04-16 DIAGNOSIS — R5381 Other malaise: Secondary | ICD-10-CM

## 2014-04-16 DIAGNOSIS — R002 Palpitations: Secondary | ICD-10-CM

## 2014-04-16 NOTE — Progress Notes (Signed)
Subjective:    Patient ID: Rebecca Bond, female    DOB: 1946-05-02, 68 y.o.   MRN: [id number]  HPI   Patient who has a hHx/o indolent CLL for several years comes in rather upset today - having  worked herself up in a tizzy regarding a recent CBC at the [facility] showing her WBC was increasing and her Hgb was down slightly with commitant low Immunoglobulin levels. She reports being very depressed and sad to the point tat she has not been able to go to work the last 3 days.  She als reports an episode of diarrhea about 5 days ago and some intermittent transient palpitations over the last 2 weeks. In addition, she has had some rt Flank & abdominal pains w/o changes in her urine or dysuria.    Medication List       ALPRAZolam 0.5 MG tablet  Commonly known as:  XANAX  Take 0.25 mg by mouth daily as needed. For anxiety     buPROPion 300 MG 24 hr tablet  Commonly known as:  WELLBUTRIN XL  TAKE 1 TABLET BY MOUTH DAILY FOR MOOD AS DIRECTED     ezetimibe 10 MG tablet  Commonly known as:  ZETIA  Take 1 tablet (10 mg total) by mouth at bedtime.     fish oil-omega-3 fatty acids 1000 MG capsule  Take 1 g by mouth daily.     furosemide 40 MG tablet  Commonly known as:  LASIX  Take 40 mg by mouth 2 (two) times daily. For fluid and swelling.  Takes only as needed.     hydrochlorothiazide 12.5 MG tablet  Commonly known as:  HYDRODIURIL  Take 12.5 mg by mouth daily.     mometasone 50 MCG/ACT nasal spray  Commonly known as:  NASONEX  Place 2 sprays into the nose daily.     Nebivolol HCl 20 MG Tabs  Commonly known as:  BYSTOLIC  Take 1.5 tablets (30 mg total) by mouth daily.     vitamin C 1000 MG tablet  Take 1,000 mg by mouth daily.     VITAMIN D PO  Take 1,000 Units by mouth 3 (three) times daily.       Allergies  Allergen Reactions  . Hyzaar [Losartan Potassium-Hctz] Other (See Comments)    "messed up my sodium counts" swells up lips.  [name] [Pseudoephedrine Hcl]  Palpitations  . Biaxin [Clarithromycin]     GI Upset  . Celexa [Citalopram]   . Flexeril [Cyclobenzaprine]     "zombie-like" feeling  . Fosamax [Alendronate Sodium]     GI upset  . Iohexol Hives     Code: HIVES, Desc: pt states she broke out in hive 20 yrs ago from IV contrast.     . Losartan     Angioedema  . Meloxicam     GI upset  . Norvasc [Amlodipine] Swelling  . Pseudoephedrine     Palpitations  . Zoloft [Sertraline Hcl]     Past Medical History  Diagnosis Date  . Benign essential HTN 11/22/2011  . CLL (chronic lymphocytic leukemia)   . Pneumonia 01/2010  . History of bronchitis 08/02/2012    "maybe once/yr if that much; last time 01/2010"  . Recurrent sinus infections 08/02/2012  . 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 High cholesterol   . 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 Prediabetes   . Anxiety   . Depression   . GERD (gastroesophageal reflux disease)   .  OSA (obstructive sleep apnea)    Review of Systems  In addition to the HPI above,  No Fever-chills,  No Headache, No changes with Vision or hearing,  No problems swallowing food or Liquids,  No Chest pain or productive Cough or Shortness of Breath,   No Nausea or Vommitting, Bowel movements are regular,  No Blood in stool or Urine,  No dysuria,  No new skin rashes or bruises,  No new joints pains-aches,  No new weakness, tingling, numbness in any extremity,  No recent weight loss,  No polyuria, polydypsia or polyphagia,  No significant Mental Stressors.  A full 10 point Review of Systems was done, except as stated above, all other Review of Systems were negative  Objective:   Physical Exam  BP 128/70  Pulse 68  Temp(Src) 98.2 F (36.8 C) (Temporal)  Resp 18  Ht 5' 2.5" (1.588 m)  Wt 198 lb (89.812 kg)  BMI 35.62 kg/m2  HEENT - Eac's patent. TM's Nl.EOM's full. PERRLA. NasoOroPharynx clear. Neck - supple. Nl Thyroid. No bruits nodes JVD Chest - Clear equal  BS Cor - Nl HS. RRR w/o sig MGR. PP 1(+) No edema. Abd - No palpable organomegaly, masses or tenderness. BS nl. MS- FROM. w/o deformities. Muscle power tone and bulk Nl. Gait Nl. Neuro - No obvious Cr N abnormalities. Sensory, motor and Cerebellar functions appear Nl w/o focal abnormalities.  Assessment & Plan:   1. Other malaise and fatigue  - TSH - CBC with Differential - BASIC METABOLIC PANEL WITH GFR - Hepatic function panel - Magnesium  2. Abdominal pain  - Urine Microscopic  3. Palpitations  - EKG 12-Lead - WNL   4. PreDiabetes  - Hemoglobin A1c

## 2014-04-17 LAB — CBC WITH DIFFERENTIAL/PLATELET
Basophils Absolute: 0 10*3/uL (ref 0.0–0.1)
Basophils Relative: 0 % (ref 0–1)
EOS ABS: 0 10*3/uL (ref 0.0–0.7)
EOS PCT: 0 % (ref 0–5)
HCT: 34.1 % — ABNORMAL LOW (ref 36.0–46.0)
HEMOGLOBIN: 11.3 g/dL — AB (ref 12.0–15.0)
LYMPHS ABS: 113.9 10*3/uL — AB (ref 0.7–4.0)
Lymphocytes Relative: 93 % — ABNORMAL HIGH (ref 12–46)
MCH: 32.6 pg (ref 26.0–34.0)
MCHC: 33.1 g/dL (ref 30.0–36.0)
MCV: 98.3 fL (ref 78.0–100.0)
Monocytes Absolute: 1.2 10*3/uL — ABNORMAL HIGH (ref 0.1–1.0)
Monocytes Relative: 1 % — ABNORMAL LOW (ref 3–12)
NEUTROS PCT: 6 % — AB (ref 43–77)
Neutro Abs: 7.4 10*3/uL (ref 1.7–7.7)
Platelets: 144 10*3/uL — ABNORMAL LOW (ref 150–400)
RBC: 3.47 MIL/uL — ABNORMAL LOW (ref 3.87–5.11)
RDW: 15 % (ref 11.5–15.5)
WBC: 122.5 10*3/uL — ABNORMAL HIGH (ref 4.0–10.5)

## 2014-04-17 LAB — BASIC METABOLIC PANEL WITH GFR
BUN: 11 mg/dL (ref 6–23)
CALCIUM: 9.4 mg/dL (ref 8.4–10.5)
CO2: 26 meq/L (ref 19–32)
Chloride: 91 mEq/L — ABNORMAL LOW (ref 96–112)
Creat: 0.74 mg/dL (ref 0.50–1.10)
GFR, Est African American: >89 mL/min
GFR, Est Non African American: 84 mL/min
GLUCOSE: 93 mg/dL (ref 70–99)
Potassium: 3.7 mEq/L (ref 3.5–5.3)
SODIUM: 128 meq/L — AB (ref 135–145)

## 2014-04-17 LAB — URINALYSIS, MICROSCOPIC ONLY
Bacteria, UA: NONE SEEN
Casts: NONE SEEN
Crystals: NONE SEEN
SQUAMOUS EPITHELIAL / LPF: NONE SEEN

## 2014-04-17 LAB — HEPATIC FUNCTION PANEL
ALT: 28 U/L (ref 0–35)
AST: 25 U/L (ref 0–37)
Albumin: 4.4 g/dL (ref 3.5–5.2)
Alkaline Phosphatase: 76 U/L (ref 39–117)
BILIRUBIN DIRECT: 0.1 mg/dL (ref 0.0–0.3)
Indirect Bilirubin: 0.6 mg/dL (ref 0.2–1.2)
Total Bilirubin: 0.7 mg/dL (ref 0.2–1.2)
Total Protein: 6.4 g/dL (ref 6.0–8.3)

## 2014-04-17 LAB — HEMOGLOBIN A1C
Hgb A1c MFr Bld: 5.4 % (ref <5.7)
Mean Plasma Glucose: 108 mg/dL (ref <117)

## 2014-04-17 LAB — MAGNESIUM: MAGNESIUM: 1.4 mg/dL — AB (ref 1.5–2.5)

## 2014-04-17 LAB — TSH: TSH: 4.121 u[IU]/mL (ref 0.350–4.500)

## 2014-04-18 ENCOUNTER — Telehealth: Payer: Self-pay | Admitting: *Deleted

## 2014-04-18 NOTE — Telephone Encounter (Signed)
Pt left VM to inform Dr. Alvy Bimler she is feeling much better. Her headache and sore throat are better.

## 2014-04-20 ENCOUNTER — Other Ambulatory Visit: Payer: Self-pay | Admitting: Internal Medicine

## 2014-04-21 ENCOUNTER — Telehealth: Payer: Self-pay

## 2014-04-21 NOTE — Telephone Encounter (Signed)
lmom to pt to return my call for lab results. 

## 2014-04-21 NOTE — Telephone Encounter (Signed)
Message copied by Nadyne Coombes on Mon Apr 21, 2014  9:48 AM ------      Message from: [name]      Created: Sun Apr 20, 2014  4:43 PM       Hgb & Plt Ct are stable - WBC is about the same       Na+ / Sodium is down some - cut back on lasix or cut it out and liberalize your salt intake      Liver is Nl/OK      Magnesium is very low - Recommend Magnesium 500 mg 2 x day with meals      Thyroid is Nl/OK      A1c is Nl/OK      U/A is nl - no sign of infection ------

## 2014-04-24 ENCOUNTER — Telehealth: Payer: Self-pay | Admitting: *Deleted

## 2014-04-24 NOTE — Telephone Encounter (Signed)
Patient called .  States she took Magnesium 500 mg BID and had diarrhea all night and feels dehydrated.  Per Dr Melford Aase, drinks lots of water, gatorade and stop Magnesium until next week and retry taking 1/2 tab 1 time a day.  Left message to inform patient.

## 2014-04-30 ENCOUNTER — Other Ambulatory Visit: Payer: 59

## 2014-05-02 ENCOUNTER — Telehealth: Payer: Self-pay | Admitting: Oncology

## 2014-05-02 NOTE — Telephone Encounter (Signed)
pt returned my call today and was given appt w/LL for 6/19 lb/LL @ 2:30pm. message from jeff that pt requesting transfer from NG to LL. both NG/LL aware and ok w/change.

## 2014-05-05 ENCOUNTER — Other Ambulatory Visit: Payer: Self-pay | Admitting: Internal Medicine

## 2014-05-05 ENCOUNTER — Telehealth: Payer: Self-pay | Admitting: Oncology

## 2014-05-05 NOTE — Telephone Encounter (Signed)
returned pt's call re r/s 6/19 appt w/LL. per pt she cannot come 6/19 and wants appts change to any of the following..... 6/16, 6/17, any day the wk of 6/22, 6/29, 6/30, or 7/1. appts for lb/LL moved to 6/29. lmonvm for pt re new d/t and mailed schedule.

## 2014-05-14 ENCOUNTER — Other Ambulatory Visit: Payer: Self-pay | Admitting: *Deleted

## 2014-05-14 MED ORDER — HYDROCHLOROTHIAZIDE 12.5 MG PO TABS: 12.5000 mg | ORAL_TABLET | Freq: Every day | ORAL | Status: DC

## 2014-05-19 ENCOUNTER — Other Ambulatory Visit: Payer: 59

## 2014-05-19 ENCOUNTER — Ambulatory Visit: Payer: 59 | Admitting: Hematology and Oncology

## 2014-05-20 ENCOUNTER — Ambulatory Visit: Payer: Self-pay | Admitting: Emergency Medicine

## 2014-05-23 ENCOUNTER — Ambulatory Visit: Payer: 59 | Admitting: Oncology

## 2014-05-23 ENCOUNTER — Telehealth: Payer: Self-pay

## 2014-05-23 ENCOUNTER — Other Ambulatory Visit: Payer: 59

## 2014-05-23 NOTE — Telephone Encounter (Signed)
Left a message in patient's voicemail that she needs to bring a prescription from Dr. Melford Aase for lab work that he wants done.  She needs to give the prescription to the lab technichian so the results will go directly to Dr. Melford Aase.

## 2014-05-31 ENCOUNTER — Other Ambulatory Visit: Payer: Self-pay | Admitting: Oncology

## 2014-05-31 DIAGNOSIS — C9110 Chronic lymphocytic leukemia of B-cell type not having achieved remission: Secondary | ICD-10-CM

## 2014-05-31 DIAGNOSIS — D539 Nutritional anemia, unspecified: Secondary | ICD-10-CM

## 2014-06-02 ENCOUNTER — Other Ambulatory Visit (HOSPITAL_BASED_OUTPATIENT_CLINIC_OR_DEPARTMENT_OTHER): Payer: 59

## 2014-06-02 ENCOUNTER — Ambulatory Visit (HOSPITAL_BASED_OUTPATIENT_CLINIC_OR_DEPARTMENT_OTHER): Payer: 59 | Admitting: Oncology

## 2014-06-02 ENCOUNTER — Other Ambulatory Visit (HOSPITAL_COMMUNITY)
Admission: RE | Admit: 2014-06-02 | Discharge: 2014-06-02 | Disposition: A | Discharge status: Home / Self Care | Payer: 59 | Source: Ambulatory Visit | Attending: Oncology | Admitting: Oncology

## 2014-06-02 ENCOUNTER — Telehealth: Payer: Self-pay | Admitting: Oncology

## 2014-06-02 VITALS — BP 187/78 | HR 67 | Temp 98.1°F | Resp 18 | Ht 62.5 in | Wt 206.1 lb

## 2014-06-02 DIAGNOSIS — C9110 Chronic lymphocytic leukemia of B-cell type not having achieved remission: Secondary | ICD-10-CM

## 2014-06-02 DIAGNOSIS — D539 Nutritional anemia, unspecified: Secondary | ICD-10-CM

## 2014-06-02 LAB — COMPREHENSIVE METABOLIC PANEL (CC13)
ALK PHOS: 73 U/L (ref 40–150)
ALT: 20 U/L (ref 0–55)
AST: 21 U/L (ref 5–34)
Albumin: 4 g/dL (ref 3.5–5.0)
Anion Gap: 11 mEq/L (ref 3–11)
BUN: 10.3 mg/dL (ref 7.0–26.0)
CO2: 24 meq/L (ref 22–29)
Calcium: 9.1 mg/dL (ref 8.4–10.4)
Chloride: 103 mEq/L (ref 98–109)
Creatinine: 0.9 mg/dL (ref 0.6–1.1)
Glucose: 115 mg/dl (ref 70–140)
Potassium: 3.7 mEq/L (ref 3.5–5.1)
SODIUM: 138 meq/L (ref 136–145)
TOTAL PROTEIN: 6.5 g/dL (ref 6.4–8.3)
Total Bilirubin: 0.43 mg/dL (ref 0.20–1.20)

## 2014-06-02 LAB — CBC WITH DIFFERENTIAL/PLATELET
BASO%: 0.1 % (ref 0.0–2.0)
BASOS ABS: 0 10*3/uL (ref 0.0–0.1)
EOS%: 0.6 % (ref 0.0–7.0)
Eosinophils Absolute: 0.3 10*3/uL (ref 0.0–0.5)
HCT: 35.8 % (ref 34.8–46.6)
HGB: 11.6 g/dL (ref 11.6–15.9)
LYMPH#: 37.8 10*3/uL — AB (ref 0.9–3.3)
LYMPH%: 83.9 % — ABNORMAL HIGH (ref 14.0–49.7)
MCH: 33 pg (ref 25.1–34.0)
MCHC: 32.3 g/dL (ref 31.5–36.0)
MCV: 102.1 fL — ABNORMAL HIGH (ref 79.5–101.0)
MONO#: 1 10*3/uL — ABNORMAL HIGH (ref 0.1–0.9)
MONO%: 2.2 % (ref 0.0–14.0)
NEUT%: 13.2 % — AB (ref 38.4–76.8)
NEUTROS ABS: 5.9 10*3/uL (ref 1.5–6.5)
Platelets: 153 10*3/uL (ref 145–400)
RBC: 3.51 10*6/uL — ABNORMAL LOW (ref 3.70–5.45)
RDW: 14.3 % (ref 11.2–14.5)
WBC: 45.1 10*3/uL — AB (ref 3.9–10.3)

## 2014-06-02 LAB — LACTATE DEHYDROGENASE (CC13): LDH: 211 U/L (ref 125–245)

## 2014-06-02 NOTE — Telephone Encounter (Signed)
per pof to sch pt appt. Had Kathrine Cords to override for4:00-gave pt copy of sch

## 2014-06-03 ENCOUNTER — Encounter (HOSPITAL_COMMUNITY): Payer: Self-pay | Admitting: Emergency Medicine

## 2014-06-03 ENCOUNTER — Emergency Department (HOSPITAL_COMMUNITY): Payer: 59

## 2014-06-03 ENCOUNTER — Other Ambulatory Visit: Payer: Self-pay

## 2014-06-03 ENCOUNTER — Other Ambulatory Visit (HOSPITAL_COMMUNITY): Payer: 59

## 2014-06-03 ENCOUNTER — Observation Stay (HOSPITAL_COMMUNITY)
Admission: EM | Admit: 2014-06-03 | Discharge: 2014-06-05 | Disposition: A | Discharge status: Home / Self Care | Payer: 59 | Attending: General Surgery | Admitting: General Surgery

## 2014-06-03 ENCOUNTER — Emergency Department (INDEPENDENT_AMBULATORY_CARE_PROVIDER_SITE_OTHER)
Admission: EM | Admit: 2014-06-03 | Discharge: 2014-06-03 | Disposition: A | Discharge status: Nursing Home (Includes ICF) | Payer: 59 | Source: Home / Self Care

## 2014-06-03 DIAGNOSIS — F329 Major depressive disorder, single episode, unspecified: Secondary | ICD-10-CM | POA: Insufficient documentation

## 2014-06-03 DIAGNOSIS — R1013 Epigastric pain: Secondary | ICD-10-CM

## 2014-06-03 DIAGNOSIS — F411 Generalized anxiety disorder: Secondary | ICD-10-CM | POA: Insufficient documentation

## 2014-06-03 DIAGNOSIS — R7309 Other abnormal glucose: Secondary | ICD-10-CM | POA: Diagnosis not present

## 2014-06-03 DIAGNOSIS — K3580 Unspecified acute appendicitis: Principal | ICD-10-CM | POA: Insufficient documentation

## 2014-06-03 DIAGNOSIS — F3289 Other specified depressive episodes: Secondary | ICD-10-CM | POA: Diagnosis not present

## 2014-06-03 DIAGNOSIS — Z6836 Body mass index (BMI) 36.0-36.9, adult: Secondary | ICD-10-CM | POA: Diagnosis not present

## 2014-06-03 DIAGNOSIS — R609 Edema, unspecified: Secondary | ICD-10-CM | POA: Insufficient documentation

## 2014-06-03 DIAGNOSIS — K219 Gastro-esophageal reflux disease without esophagitis: Secondary | ICD-10-CM | POA: Diagnosis not present

## 2014-06-03 DIAGNOSIS — G4733 Obstructive sleep apnea (adult) (pediatric): Secondary | ICD-10-CM | POA: Insufficient documentation

## 2014-06-03 DIAGNOSIS — Z87891 Personal history of nicotine dependence: Secondary | ICD-10-CM | POA: Diagnosis not present

## 2014-06-03 DIAGNOSIS — I10 Essential (primary) hypertension: Secondary | ICD-10-CM | POA: Diagnosis not present

## 2014-06-03 DIAGNOSIS — C9110 Chronic lymphocytic leukemia of B-cell type not having achieved remission: Secondary | ICD-10-CM | POA: Insufficient documentation

## 2014-06-03 LAB — COMPREHENSIVE METABOLIC PANEL
ALBUMIN: 4.5 g/dL (ref 3.5–5.2)
ALT: 20 U/L (ref 0–35)
AST: 27 U/L (ref 0–37)
Alkaline Phosphatase: 77 U/L (ref 39–117)
BUN: 11 mg/dL (ref 6–23)
CALCIUM: 10 mg/dL (ref 8.4–10.5)
CO2: 23 mEq/L (ref 19–32)
Chloride: 95 mEq/L — ABNORMAL LOW (ref 96–112)
Creatinine, Ser: 0.77 mg/dL (ref 0.50–1.10)
GFR calc non Af Amer: 85 mL/min — ABNORMAL LOW (ref >90)
Glucose, Bld: 101 mg/dL — ABNORMAL HIGH (ref 70–99)
POTASSIUM: 3.9 meq/L (ref 3.7–5.3)
Sodium: 135 mEq/L — ABNORMAL LOW (ref 137–147)
TOTAL PROTEIN: 7.2 g/dL (ref 6.0–8.3)
Total Bilirubin: 0.5 mg/dL (ref 0.3–1.2)

## 2014-06-03 LAB — CBC WITH DIFFERENTIAL/PLATELET
BASOS ABS: 0 10*3/uL (ref 0.0–0.1)
Basophils Relative: 0 % (ref 0–1)
Eosinophils Absolute: 0 10*3/uL (ref 0.0–0.7)
Eosinophils Relative: 0 % (ref 0–5)
HCT: 35.5 % — ABNORMAL LOW (ref 36.0–46.0)
Hemoglobin: 12.2 g/dL (ref 12.0–15.0)
LYMPHS PCT: 86 % — AB (ref 12–46)
Lymphs Abs: 47.2 10*3/uL — ABNORMAL HIGH (ref 0.7–4.0)
MCH: 33.8 pg (ref 26.0–34.0)
MCHC: 34.4 g/dL (ref 30.0–36.0)
MCV: 98.3 fL (ref 78.0–100.0)
MONO ABS: 0.6 10*3/uL (ref 0.1–1.0)
Monocytes Relative: 1 % — ABNORMAL LOW (ref 3–12)
NEUTROS PCT: 13 % — AB (ref 43–77)
Neutro Abs: 7.2 10*3/uL (ref 1.7–7.7)
PLATELETS: 141 10*3/uL — AB (ref 150–400)
RBC: 3.61 MIL/uL — ABNORMAL LOW (ref 3.87–5.11)
RDW: 13.6 % (ref 11.5–15.5)
WBC: 55 10*3/uL (ref 4.0–10.5)

## 2014-06-03 LAB — LIPASE, BLOOD: Lipase: 32 U/L (ref 11–59)

## 2014-06-03 MED ORDER — HYDROMORPHONE HCL PF 1 MG/ML IJ SOLN
0.5000 mg | Freq: Once | INTRAMUSCULAR | Status: AC
  Administered 2014-06-03: 0.5 mg via INTRAVENOUS
  Filled 2014-06-03: qty 1

## 2014-06-03 MED ORDER — ONDANSETRON HCL 4 MG/2ML IJ SOLN
4.0000 mg | Freq: Once | INTRAMUSCULAR | Status: AC
  Administered 2014-06-03: 4 mg via INTRAVENOUS
  Filled 2014-06-03: qty 2

## 2014-06-03 MED ORDER — ONDANSETRON 4 MG PO TBDP: 4.0000 mg | ORAL_TABLET | Freq: Once | ORAL | Status: DC

## 2014-06-03 MED ORDER — ONDANSETRON 4 MG PO TBDP
ORAL_TABLET | ORAL | Status: AC
  Filled 2014-06-03: qty 1

## 2014-06-03 MED ORDER — DIPHENHYDRAMINE HCL 50 MG/ML IJ SOLN
25.0000 mg | Freq: Once | INTRAMUSCULAR | Status: AC
  Administered 2014-06-04: 25 mg via INTRAVENOUS
  Filled 2014-06-03: qty 1

## 2014-06-03 MED ORDER — BARIUM SULFATE 2.1 % PO SUSP
450.0000 mL | Freq: Once | ORAL | Status: AC
  Administered 2014-06-03: 450 mL via ORAL

## 2014-06-03 NOTE — ED Provider Notes (Signed)
CSN: [id number]     Arrival date & time 06/03/14  2042 History   First MD Initiated Contact with Patient 06/03/14 2230     Chief Complaint  Patient presents with  . Abdominal Pain     (Consider location/radiation/quality/duration/timing/severity/associated sxs/prior Treatment) HPI Rebecca Bond is a 68 y.o. female who presents to emergency department with abdominal pain. Patient states that her pain began acutely around 5 hours ago. States pain began in the upper abdomen but now is all over. Pain is constant but comes and goes in severity. Patient admits to nausea, denies vomiting. States last bowel movement earlier today and states it was normal, but states "it felt like a was not done yet." Patient states she took 4 times and took a Pepcid with no relief. She then went to urgent care was transferred here. Patient has history of CLL, states recently towels were down. States has had history of cholecystectomy and hysterectomy. Patient denies history of small bowel obstruction. She does state that her abdomen is swollen. She is passing gas. Denies any urinary symptoms. Denies any fever chills.  Past Medical History  Diagnosis Date  . Benign essential HTN 11/22/2011  . CLL (chronic lymphocytic leukemia)   . Pneumonia 01/2010  . History of bronchitis 08/02/2012    "maybe once/yr if that much; last time 01/2010"  . Recurrent sinus infections 08/02/2012  . 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 High cholesterol   . 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 Prediabetes   . Anxiety   . Depression   . GERD (gastroesophageal reflux disease)   . OSA (obstructive sleep apnea)    Past Surgical History  Procedure Laterality Date  . Cholecystectomy  1985  . Tonsillectomy and adenoidectomy  1950's  . Breast surgery    . Lymph node biopsy      "determined I had CLL"  . Temporomandibular joint arthroplasty  1980's  . Functional endoscopic sinus surgery  1990's    "cause I  kept having sinus infections"  . Abdominal hysterectomy  1980's    "endometrosis"   Family History  Problem Relation Age of Onset  . Parkinson's disease Mother   . Hypertension Mother   . Hypertension Maternal Grandmother   . Heart attack Maternal Grandmother     Mild   History  Substance Use Topics  . Smoking status: Former Smoker -- 0.75 packs/day for 4 years    Types: Cigarettes    Quit date: 12/05/1968  . Smokeless tobacco: Never Used  . Alcohol Use: 8.4 oz/week    14 Glasses of wine per week     Comment: 08/02/2012 "couple glasses wine q hs; last time 2-3 wk ago"   OB History   Grav Para Term Preterm Abortions TAB SAB Ect Mult Living                 Review of Systems  Constitutional: Negative for fever and chills.  Respiratory: Negative for cough, chest tightness and shortness of breath.   Cardiovascular: Negative for chest pain, palpitations and leg swelling.  Gastrointestinal: Positive for nausea and abdominal pain. Negative for vomiting, diarrhea and blood in stool.  Genitourinary: Negative for dysuria and flank pain.  Musculoskeletal: Negative for arthralgias, myalgias, neck pain and neck stiffness.  Skin: Negative for rash.  Neurological: Negative for dizziness, weakness and headaches.  All other systems reviewed and are negative.     Allergies  Hyzaar; Sudafed; Biaxin; Celexa; Flexeril; Fosamax; Iohexol; Losartan; Meloxicam;  Norvasc; Pseudoephedrine; and Zoloft  Home Medications   Prior to Admission medications   Medication Sig Start Date End Date Taking? Authorizing Provider  ALPRAZolam Duanne Moron) 0.5 MG tablet Take 0.25 mg by mouth daily as needed. For anxiety   Yes Historical Provider, MD  Ascorbic Acid (VITAMIN C) 1000 MG tablet Take 1,000 mg by mouth daily.   Yes Historical Provider, MD  buPROPion (WELLBUTRIN XL) 300 MG 24 hr tablet TAKE 1 TABLET BY MOUTH DAILY FOR MOOD AS DIRECTED   Yes Melissa R Smith, PA-C  Cholecalciferol (VITAMIN D PO) Take 1,000  Units by mouth 3 (three) times daily.   Yes Historical Provider, MD  ezetimibe (ZETIA) 10 MG tablet Take 1 tablet (10 mg total) by mouth at bedtime. 12/04/13  Yes Troy Sine, MD  fexofenadine (ALLEGRA) 180 MG tablet Take 180 mg by mouth daily.   Yes Historical Provider, MD  furosemide (LASIX) 40 MG tablet Take 40 mg by mouth 2 (two) times daily as needed. For fluid and swelling.  Takes only as needed. 01/08/14 01/08/15 Yes [name], MD  hydrochlorothiazide (HYDRODIURIL) 12.5 MG tablet Take 1 tablet (12.5 mg total) by mouth daily. 05/14/14  Yes [name], MD  Nebivolol HCl (BYSTOLIC) 20 MG TABS Take 1.5 tablets (30 mg total) by mouth daily. 04/03/14  Yes Troy Sine, MD  OVER THE COUNTER MEDICATION Nasocort daily as needed   Yes Historical Provider, MD   BP 204/79  Pulse 72  Temp(Src) 97.4 F (36.3 C) (Oral)  Resp 20  Ht 5\' 3"  (1.6 m)  Wt 198 lb (89.812 kg)  BMI 35.08 kg/m2  SpO2 95% Physical Exam  Nursing note and vitals reviewed. Constitutional: She is oriented to person, place, and time. She appears well-developed and well-nourished. No distress.  HENT:  Head: Normocephalic.  Eyes: Conjunctivae are normal.  Neck: Neck supple.  Cardiovascular: Normal rate, regular rhythm and normal heart sounds.   Pulmonary/Chest: Effort normal and breath sounds normal. No respiratory distress. She has no wheezes. She has no rales.  Abdominal: Soft. Bowel sounds are normal. She exhibits no distension. There is tenderness. There is no rebound.  Diffuse tenderness, guarding  Musculoskeletal: She exhibits no edema.  Neurological: She is alert and oriented to person, place, and time.  Skin: Skin is warm and dry.  Psychiatric: She has a normal mood and affect. Her behavior is normal.    ED Course  Procedures (including critical care time) Labs Review Labs Reviewed  CBC WITH DIFFERENTIAL - Abnormal; Notable for the following:    WBC 55.0 (*)    RBC 3.61 (*)    HCT 35.5 (*)     Platelets 141 (*)    Neutrophils Relative % 13 (*)    Lymphocytes Relative 86 (*)    Monocytes Relative 1 (*)    Lymphs Abs 47.2 (*)    All other components within normal limits  COMPREHENSIVE METABOLIC PANEL - Abnormal; Notable for the following:    Sodium 135 (*)    Chloride 95 (*)    Glucose, Bld 101 (*)    GFR calc non Af Amer 85 (*)    All other components within normal limits  LIPASE, BLOOD  LIPASE, BLOOD    Imaging Review No results found.   EKG Interpretation None      MDM   Final diagnoses:  None     10:48 PM Pt seen and examined, diffuse abdominal pain and tenderness, with guarding. Hypertensive. Abdomen is distended according to her.  Normal aorta on [place] on 09/04/12, doubt AAA. Suspicious for SBO. Will get CT abd pelvs.    12:32 AM Patient had pain medication of which improved her pain slightly, and she is requesting more pain medications at this time. We'll repeat dose of Dilaudid. Patient does have history of allergies to IVP dye, states he broke out in hives 20 years ago. Discussed radiology tech will premedicate with Benadryl and give her lower dose. He should is agreeable to this. Will continue to monitor, CT pending  Pt signed out to Dr. Dina Rich pending CT abd/pelvis  Renold Genta, PA-C 06/05/14 [phone]

## 2014-06-03 NOTE — ED Notes (Signed)
Abdominal pain that started today around 5:00 pm.  Patient reports pain in epigastric area is "twisting" pain in lower abdomen is "bloated" feeling.  Pain radiates around right side to right back.  Patient had a normal bm per patient .  bm was after onset of pain , offered some relief.  Patient is rocking on exam table, feels beter with movement.  Has used tums and ranitidine without relief.

## 2014-06-03 NOTE — ED Provider Notes (Signed)
CSN: [id number]     Arrival date & time 06/03/14  1955 History   None    Chief Complaint  Patient presents with  . Abdominal Pain   (Consider location/radiation/quality/duration/timing/severity/associated sxs/prior Treatment) HPI Comments: 68 year old female with history of CLL presents complaining of severe 10 out of 10 epigastric abdominal pain. This started at 5 PM, approximately 3 hours ago. She has pain in her epigastrium that radiates around her left flank. She has not been able to eat all day either. He denies any episodes of vomiting. She has never had this before. She has a history of gastritis but this seems more severe. She saw her oncologist yesterday but this was not happening yesterday. Yesterday her white blood cell count was 45,000, last month it was 122,000. She has a history of a cholecystectomy.  Patient is a 68 y.o. female presenting with abdominal pain.  Abdominal Pain   Past Medical History  Diagnosis Date  . Benign essential HTN 11/22/2011  . CLL (chronic lymphocytic leukemia)   . Pneumonia 01/2010  . History of bronchitis 08/02/2012    "maybe once/yr if that much; last time 01/2010"  . Recurrent sinus infections 08/02/2012  . 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 High cholesterol   . 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 Prediabetes   . Anxiety   . Depression   . GERD (gastroesophageal reflux disease)   . OSA (obstructive sleep apnea)    Past Surgical History  Procedure Laterality Date  . Cholecystectomy  1985  . Tonsillectomy and adenoidectomy  1950's  . Breast surgery    . Lymph node biopsy      "determined I had CLL"  . Temporomandibular joint arthroplasty  1980's  . Functional endoscopic sinus surgery  1990's    "cause I kept having sinus infections"  . Abdominal hysterectomy  1980's    "endometrosis"   Family History  Problem Relation Age of Onset  . Parkinson's disease Mother   . Hypertension Mother   . Hypertension  Maternal Grandmother   . Heart attack Maternal Grandmother     Mild   History  Substance Use Topics  . Smoking status: Former Smoker -- 0.75 packs/day for 4 years    Types: Cigarettes    Quit date: 12/05/1968  . Smokeless tobacco: Never Used  . Alcohol Use: 8.4 oz/week    14 Glasses of wine per week     Comment: 08/02/2012 "couple glasses wine q hs; last time 2-3 wk ago"   OB History   Grav Para Term Preterm Abortions TAB SAB Ect Mult Living                 Review of Systems  Gastrointestinal: Positive for abdominal pain.  Genitourinary: Positive for flank pain.  All other systems reviewed and are negative.   Allergies  Hyzaar; Sudafed; Biaxin; Celexa; Flexeril; Fosamax; Iohexol; Losartan; Meloxicam; Norvasc; Pseudoephedrine; and Zoloft  Home Medications   Prior to Admission medications   Medication Sig Start Date End Date Taking? Authorizing Marquia Costello  ALPRAZolam Duanne Moron) 0.5 MG tablet Take 0.25 mg by mouth daily as needed. For anxiety    Historical Tevyn Codd, MD  Ascorbic Acid (VITAMIN C) 1000 MG tablet Take 1,000 mg by mouth daily.    Historical Derrius Furtick, MD  buPROPion (WELLBUTRIN XL) 300 MG 24 hr tablet TAKE 1 TABLET BY MOUTH DAILY FOR MOOD AS DIRECTED    Melissa R Smith, PA-C  Cholecalciferol (VITAMIN D PO) Take 1,000 Units by  mouth 3 (three) times daily.    Historical Din Bookwalter, MD  ezetimibe (ZETIA) 10 MG tablet Take 1 tablet (10 mg total) by mouth at bedtime. 12/04/13   Troy Sine, MD  fexofenadine (ALLEGRA) 180 MG tablet Take 180 mg by mouth daily.    Historical Dequane Strahan, MD  fish oil-omega-3 fatty acids 1000 MG capsule Take 1 g by mouth daily.    Historical Sheika Coutts, MD  furosemide (LASIX) 40 MG tablet Take 40 mg by mouth 2 (two) times daily as needed. For fluid and swelling.  Takes only as needed. 01/08/14 01/08/15  [name], MD  hydrochlorothiazide (HYDRODIURIL) 12.5 MG tablet Take 1 tablet (12.5 mg total) by mouth daily. 05/14/14   [name], MD  Nebivolol  HCl (BYSTOLIC) 20 MG TABS Take 1.5 tablets (30 mg total) by mouth daily. 04/03/14   Troy Sine, MD  OVER THE COUNTER MEDICATION Nasocort daily as needed    Historical Charliene Inoue, MD   BP 198/71  Pulse 84  Temp(Src) 98.3 F (36.8 C) (Oral)  Resp 22  SpO2 98% Physical Exam  Nursing note and vitals reviewed. Constitutional: She is oriented to person, place, and time. Vital signs are normal. She appears well-developed and well-nourished. No distress.  HENT:  Head: Normocephalic and atraumatic.  Cardiovascular: Normal rate, regular rhythm and normal heart sounds.   Pulmonary/Chest: Effort normal and breath sounds normal. No respiratory distress.  Abdominal: She exhibits distension. She exhibits no pulsatile midline mass. There is tenderness (severe tenderness, worse in the epigastrium) in the epigastric area. There is guarding and positive Murphy's sign. There is no rigidity, no rebound and no CVA tenderness.  Neurological: She is alert and oriented to person, place, and time. She has normal strength. Coordination normal.  Skin: Skin is warm and dry. No rash noted. She is not diaphoretic.  Psychiatric: She has a normal mood and affect. Judgment normal.    ED Course  Procedures (including critical care time) Labs Review Labs Reviewed - No data to display  Imaging Review No results found.   MDM   1. Abdominal pain, epigastric    Differential includes pancreatitis, peptic ulcer disease, abdominal aortic aneurysm. This patient needs further evaluation, likely to include CT scan. Transferred to ED via [place], PA-C 06/03/14 2026

## 2014-06-03 NOTE — ED Notes (Signed)
Patient here from urgent care with complaint of abdominal pain starting today around 1700. States that pain started suddenly. Points to epigastric area and states that the pain radiates to the sides and back.

## 2014-06-04 ENCOUNTER — Encounter (HOSPITAL_COMMUNITY): Payer: Self-pay | Admitting: Radiology

## 2014-06-04 ENCOUNTER — Observation Stay (HOSPITAL_COMMUNITY): Payer: 59 | Admitting: Anesthesiology

## 2014-06-04 ENCOUNTER — Encounter (HOSPITAL_COMMUNITY)
Admission: EM | Disposition: A | Discharge status: Home / Self Care | Payer: Self-pay | Source: Home / Self Care | Attending: Emergency Medicine

## 2014-06-04 ENCOUNTER — Ambulatory Visit (HOSPITAL_COMMUNITY): Payer: 59 | Admitting: Anesthesiology

## 2014-06-04 DIAGNOSIS — K3580 Unspecified acute appendicitis: Secondary | ICD-10-CM

## 2014-06-04 HISTORY — PX: LAPAROSCOPIC APPENDECTOMY: SHX408

## 2014-06-04 LAB — HAPTOGLOBIN: HAPTOGLOBIN: 154 mg/dL (ref 45–215)

## 2014-06-04 LAB — SURGICAL PCR SCREEN
MRSA, PCR: NEGATIVE
Staphylococcus aureus: NEGATIVE

## 2014-06-04 LAB — VITAMIN B12: Vitamin B-12: 346 pg/mL (ref 211–911)

## 2014-06-04 LAB — URINE MICROSCOPIC-ADD ON

## 2014-06-04 LAB — URINALYSIS, ROUTINE W REFLEX MICROSCOPIC
Bilirubin Urine: NEGATIVE
GLUCOSE, UA: NEGATIVE mg/dL
Hgb urine dipstick: NEGATIVE
Ketones, ur: NEGATIVE mg/dL
Nitrite: NEGATIVE
PROTEIN: NEGATIVE mg/dL
Specific Gravity, Urine: 1.017 (ref 1.005–1.030)
Urobilinogen, UA: 0.2 mg/dL (ref 0.0–1.0)
pH: 5.5 (ref 5.0–8.0)

## 2014-06-04 LAB — PATHOLOGIST SMEAR REVIEW

## 2014-06-04 LAB — IGG, IGA, IGM
IgA: 31 mg/dL — ABNORMAL LOW (ref 69–380)
IgG (Immunoglobin G), Serum: 337 mg/dL — ABNORMAL LOW (ref 690–1700)
IgM, Serum: 18 mg/dL — ABNORMAL LOW (ref 52–322)

## 2014-06-04 LAB — FOLATE RBC: RBC Folate: 585 ng/mL (ref >280)

## 2014-06-04 SURGERY — APPENDECTOMY, LAPAROSCOPIC
Anesthesia: General | Site: Abdomen

## 2014-06-04 MED ORDER — ARTIFICIAL TEARS OP OINT
TOPICAL_OINTMENT | OPHTHALMIC | Status: AC
  Filled 2014-06-04: qty 3.5

## 2014-06-04 MED ORDER — HYDROMORPHONE HCL PF 1 MG/ML IJ SOLN
0.5000 mg | Freq: Once | INTRAMUSCULAR | Status: AC
  Administered 2014-06-04: 0.5 mg via INTRAVENOUS
  Filled 2014-06-04: qty 1

## 2014-06-04 MED ORDER — NEOSTIGMINE METHYLSULFATE 10 MG/10ML IV SOLN
INTRAVENOUS | Status: DC | PRN
  Administered 2014-06-04: 4 mg via INTRAVENOUS

## 2014-06-04 MED ORDER — LIDOCAINE HCL (CARDIAC) 20 MG/ML IV SOLN
INTRAVENOUS | Status: DC | PRN
  Administered 2014-06-04: 60 mg via INTRAVENOUS

## 2014-06-04 MED ORDER — PIPERACILLIN-TAZOBACTAM 3.375 G IVPB
3.3750 g | Freq: Three times a day (TID) | INTRAVENOUS | Status: DC
  Administered 2014-06-04 – 2014-06-05 (×3): 3.375 g via INTRAVENOUS
  Filled 2014-06-04 (×6): qty 50

## 2014-06-04 MED ORDER — IOHEXOL 300 MG/ML  SOLN
80.0000 mL | Freq: Once | INTRAMUSCULAR | Status: AC | PRN
  Administered 2014-06-04: 80 mL via INTRAVENOUS

## 2014-06-04 MED ORDER — EPHEDRINE SULFATE 50 MG/ML IJ SOLN
INTRAMUSCULAR | Status: AC
  Filled 2014-06-04: qty 1

## 2014-06-04 MED ORDER — 0.9 % SODIUM CHLORIDE (POUR BTL) OPTIME
TOPICAL | Status: DC | PRN
  Administered 2014-06-04: 1000 mL

## 2014-06-04 MED ORDER — NEBIVOLOL HCL 20 MG PO TABS
30.0000 mg | ORAL_TABLET | Freq: Every day | ORAL | Status: DC
  Administered 2014-06-04 – 2014-06-05 (×2): 30 mg via ORAL
  Filled 2014-06-04 (×2): qty 2

## 2014-06-04 MED ORDER — GLYCOPYRROLATE 0.2 MG/ML IJ SOLN
INTRAMUSCULAR | Status: DC | PRN
Start: 2014-06-04 — End: 2014-06-04
  Administered 2014-06-04: 0.6 mg via INTRAVENOUS

## 2014-06-04 MED ORDER — OXYCODONE HCL 5 MG PO TABS
5.0000 mg | ORAL_TABLET | ORAL | Status: DC | PRN
  Administered 2014-06-05: 10 mg via ORAL
  Filled 2014-06-04: qty 2

## 2014-06-04 MED ORDER — BUPROPION HCL ER (XL) 150 MG PO TB24
150.0000 mg | ORAL_TABLET | Freq: Every day | ORAL | Status: DC
  Administered 2014-06-04 – 2014-06-05 (×2): 150 mg via ORAL
  Filled 2014-06-04 (×2): qty 1

## 2014-06-04 MED ORDER — ONDANSETRON HCL 4 MG/2ML IJ SOLN
INTRAMUSCULAR | Status: AC
  Filled 2014-06-04: qty 2

## 2014-06-04 MED ORDER — OXYCODONE HCL 5 MG/5ML PO SOLN: 5.0000 mg | Freq: Once | ORAL | Status: AC | PRN

## 2014-06-04 MED ORDER — OXYCODONE HCL 5 MG PO TABS
5.0000 mg | ORAL_TABLET | Freq: Once | ORAL | Status: AC | PRN
  Administered 2014-06-04: 5 mg via ORAL

## 2014-06-04 MED ORDER — FENTANYL CITRATE 0.05 MG/ML IJ SOLN
INTRAMUSCULAR | Status: DC | PRN
  Administered 2014-06-04: 50 ug via INTRAVENOUS
  Administered 2014-06-04: 100 ug via INTRAVENOUS

## 2014-06-04 MED ORDER — NEOSTIGMINE METHYLSULFATE 10 MG/10ML IV SOLN
INTRAVENOUS | Status: AC
  Filled 2014-06-04: qty 1

## 2014-06-04 MED ORDER — MIDAZOLAM HCL 2 MG/2ML IJ SOLN
INTRAMUSCULAR | Status: AC
  Filled 2014-06-04: qty 2

## 2014-06-04 MED ORDER — BUPIVACAINE-EPINEPHRINE (PF) 0.25% -1:200000 IJ SOLN
INTRAMUSCULAR | Status: AC
  Filled 2014-06-04: qty 30

## 2014-06-04 MED ORDER — ROCURONIUM BROMIDE 100 MG/10ML IV SOLN
INTRAVENOUS | Status: DC | PRN
  Administered 2014-06-04: 25 mg via INTRAVENOUS

## 2014-06-04 MED ORDER — POTASSIUM CHLORIDE IN NACL 20-0.9 MEQ/L-% IV SOLN
INTRAVENOUS | Status: DC
  Administered 2014-06-04 (×2): via INTRAVENOUS
  Filled 2014-06-04 (×6): qty 1000

## 2014-06-04 MED ORDER — OXYCODONE HCL 5 MG PO TABS
ORAL_TABLET | ORAL | Status: AC
  Filled 2014-06-04: qty 1

## 2014-06-04 MED ORDER — HYDROMORPHONE HCL PF 1 MG/ML IJ SOLN
INTRAMUSCULAR | Status: AC
  Filled 2014-06-04: qty 1

## 2014-06-04 MED ORDER — GLYCOPYRROLATE 0.2 MG/ML IJ SOLN
INTRAMUSCULAR | Status: AC
  Filled 2014-06-04: qty 3

## 2014-06-04 MED ORDER — HYDROMORPHONE HCL PF 1 MG/ML IJ SOLN
0.2500 mg | INTRAMUSCULAR | Status: DC | PRN
  Administered 2014-06-04 (×2): 0.5 mg via INTRAVENOUS

## 2014-06-04 MED ORDER — PROPOFOL 10 MG/ML IV BOLUS
INTRAVENOUS | Status: DC | PRN
  Administered 2014-06-04: 140 mg via INTRAVENOUS

## 2014-06-04 MED ORDER — BUPIVACAINE-EPINEPHRINE 0.25% -1:200000 IJ SOLN
INTRAMUSCULAR | Status: DC | PRN
  Administered 2014-06-04: 10 mL

## 2014-06-04 MED ORDER — SUCCINYLCHOLINE CHLORIDE 20 MG/ML IJ SOLN
INTRAMUSCULAR | Status: AC
  Filled 2014-06-04: qty 1

## 2014-06-04 MED ORDER — SODIUM CHLORIDE 0.9 % IR SOLN
Status: DC | PRN
  Administered 2014-06-04: 1000 mL

## 2014-06-04 MED ORDER — PROPOFOL 10 MG/ML IV BOLUS
INTRAVENOUS | Status: AC
  Filled 2014-06-04: qty 20

## 2014-06-04 MED ORDER — MIDAZOLAM HCL 5 MG/5ML IJ SOLN
INTRAMUSCULAR | Status: DC | PRN
  Administered 2014-06-04: 1 mg via INTRAVENOUS

## 2014-06-04 MED ORDER — FUROSEMIDE 40 MG PO TABS: 40.0000 mg | ORAL_TABLET | Freq: Two times a day (BID) | ORAL | Status: DC | PRN

## 2014-06-04 MED ORDER — ARTIFICIAL TEARS OP OINT
TOPICAL_OINTMENT | OPHTHALMIC | Status: DC | PRN
  Administered 2014-06-04: 1 via OPHTHALMIC

## 2014-06-04 MED ORDER — FENTANYL CITRATE 0.05 MG/ML IJ SOLN
INTRAMUSCULAR | Status: AC
  Filled 2014-06-04: qty 5

## 2014-06-04 MED ORDER — ONDANSETRON HCL 4 MG/2ML IJ SOLN
INTRAMUSCULAR | Status: DC | PRN
  Administered 2014-06-04: 4 mg via INTRAVENOUS

## 2014-06-04 MED ORDER — ROCURONIUM BROMIDE 50 MG/5ML IV SOLN
INTRAVENOUS | Status: AC
  Filled 2014-06-04: qty 1

## 2014-06-04 MED ORDER — MORPHINE SULFATE 2 MG/ML IJ SOLN
1.0000 mg | INTRAMUSCULAR | Status: DC | PRN
  Administered 2014-06-04: 4 mg via INTRAVENOUS
  Administered 2014-06-04 – 2014-06-05 (×3): 2 mg via INTRAVENOUS
  Filled 2014-06-04 (×3): qty 1
  Filled 2014-06-04 (×2): qty 2

## 2014-06-04 MED ORDER — SODIUM CHLORIDE 0.9 % IJ SOLN
INTRAMUSCULAR | Status: AC
  Filled 2014-06-04: qty 10

## 2014-06-04 MED ORDER — SUCCINYLCHOLINE CHLORIDE 20 MG/ML IJ SOLN
INTRAMUSCULAR | Status: DC | PRN
  Administered 2014-06-04: 100 mg via INTRAVENOUS

## 2014-06-04 MED ORDER — LACTATED RINGERS IV SOLN
INTRAVENOUS | Status: DC | PRN
  Administered 2014-06-04: 09:00:00 via INTRAVENOUS

## 2014-06-04 MED ORDER — ALPRAZOLAM 0.25 MG PO TABS: 0.2500 mg | ORAL_TABLET | Freq: Every day | ORAL | Status: DC | PRN

## 2014-06-04 MED ORDER — ONDANSETRON HCL 4 MG/2ML IJ SOLN: 4.0000 mg | Freq: Four times a day (QID) | INTRAMUSCULAR | Status: DC | PRN

## 2014-06-04 MED ORDER — LIDOCAINE HCL (CARDIAC) 20 MG/ML IV SOLN
INTRAVENOUS | Status: AC
  Filled 2014-06-04: qty 5

## 2014-06-04 MED ORDER — ENOXAPARIN SODIUM 40 MG/0.4ML ~~LOC~~ SOLN
40.0000 mg | SUBCUTANEOUS | Status: DC
  Administered 2014-06-05: 40 mg via SUBCUTANEOUS
  Filled 2014-06-04: qty 0.4

## 2014-06-04 SURGICAL SUPPLY — 47 items
APPLIER CLIP ROT 10 11.4 M/L (STAPLE)
BLADE SURG ROTATE 9660 (MISCELLANEOUS) IMPLANT
CANISTER SUCTION 2500CC (MISCELLANEOUS) ×2 IMPLANT
CHLORAPREP W/TINT 26ML (MISCELLANEOUS) ×2 IMPLANT
CLIP APPLIE ROT 10 11.4 M/L (STAPLE) IMPLANT
CONT SPEC 4OZ CLIKSEAL STRL BL (MISCELLANEOUS) ×2 IMPLANT
COVER SURGICAL LIGHT HANDLE (MISCELLANEOUS) ×2 IMPLANT
CUTTER LINEAR ENDO 35 ETS (STAPLE) IMPLANT
CUTTER LINEAR ENDO 35 ETS TH (STAPLE) ×2 IMPLANT
DERMABOND ADVANCED (GAUZE/BANDAGES/DRESSINGS) ×1
DERMABOND ADVANCED .7 DNX12 (GAUZE/BANDAGES/DRESSINGS) ×1 IMPLANT
DRAPE PROXIMA HALF (DRAPES) ×2 IMPLANT
DRAPE UTILITY 15X26 W/TAPE STR (DRAPE) ×4 IMPLANT
ELECT REM PT RETURN 9FT ADLT (ELECTROSURGICAL) ×2
ELECTRODE REM PT RTRN 9FT ADLT (ELECTROSURGICAL) ×1 IMPLANT
GLOVE BIO SURGEON STRL SZ8 (GLOVE) ×2 IMPLANT
GLOVE BIOGEL PI IND STRL 7.0 (GLOVE) ×1 IMPLANT
GLOVE BIOGEL PI IND STRL 7.5 (GLOVE) ×1 IMPLANT
GLOVE BIOGEL PI IND STRL 8 (GLOVE) ×1 IMPLANT
GLOVE BIOGEL PI INDICATOR 7.0 (GLOVE) ×1
GLOVE BIOGEL PI INDICATOR 7.5 (GLOVE) ×1
GLOVE BIOGEL PI INDICATOR 8 (GLOVE) ×1
GLOVE ECLIPSE 7.5 STRL STRAW (GLOVE) ×2 IMPLANT
GLOVE SURG SS PI 7.0 STRL IVOR (GLOVE) ×2 IMPLANT
GOWN STRL REUS W/ TWL LRG LVL3 (GOWN DISPOSABLE) ×2 IMPLANT
GOWN STRL REUS W/ TWL XL LVL3 (GOWN DISPOSABLE) ×1 IMPLANT
GOWN STRL REUS W/TWL LRG LVL3 (GOWN DISPOSABLE) ×2
GOWN STRL REUS W/TWL XL LVL3 (GOWN DISPOSABLE) ×1
KIT BASIN OR (CUSTOM PROCEDURE TRAY) ×2 IMPLANT
KIT ROOM TURNOVER OR (KITS) ×2 IMPLANT
NEEDLE 22X1 1/2 (OR ONLY) (NEEDLE) ×2 IMPLANT
NS IRRIG 1000ML POUR BTL (IV SOLUTION) ×2 IMPLANT
PAD ARMBOARD 7.5X6 YLW CONV (MISCELLANEOUS) ×4 IMPLANT
POUCH SPECIMEN RETRIEVAL 10MM (ENDOMECHANICALS) ×2 IMPLANT
RELOAD /EVU35 (ENDOMECHANICALS) IMPLANT
RELOAD CUTTER ETS 35MM STAND (ENDOMECHANICALS) IMPLANT
SCALPEL HARMONIC ACE (MISCELLANEOUS) ×2 IMPLANT
SET IRRIG TUBING LAPAROSCOPIC (IRRIGATION / IRRIGATOR) ×2 IMPLANT
SPECIMEN JAR SMALL (MISCELLANEOUS) IMPLANT
SUT VIC AB 4-0 PS2 27 (SUTURE) ×2 IMPLANT
TOWEL OR 17X24 6PK STRL BLUE (TOWEL DISPOSABLE) ×2 IMPLANT
TOWEL OR 17X26 10 PK STRL BLUE (TOWEL DISPOSABLE) IMPLANT
TRAY FOLEY CATH 16FR SILVER (SET/KITS/TRAYS/PACK) ×2 IMPLANT
TRAY LAPAROSCOPIC (CUSTOM PROCEDURE TRAY) ×2 IMPLANT
TROCAR XCEL 12X100 BLDLESS (ENDOMECHANICALS) ×2 IMPLANT
TROCAR XCEL BLUNT TIP 100MML (ENDOMECHANICALS) ×2 IMPLANT
TROCAR XCEL NON-BLD 5MMX100MML (ENDOMECHANICALS) ×2 IMPLANT

## 2014-06-04 NOTE — Progress Notes (Signed)
Patient ID: Rebecca Bond, female   DOB: 24-Sep-1946, 68 y.o.   MRN: [id number] Acute appendicitis. I agree with Dr. Trevor Bond assessment and plan. For laparoscopic appendectomy this AM. I also spoke with her husband.  Rebecca Skeans, MD, MPH, FACS Trauma: [phone] General Surgery: [phone]

## 2014-06-04 NOTE — ED Provider Notes (Signed)
Medical screening examination/treatment/procedure(s) were performed by a resident physician or non-physician practitioner and as the supervising physician I was immediately available for consultation/collaboration.  Lynne Leader, MD    Gregor Hams, MD 06/04/14 1435

## 2014-06-04 NOTE — Anesthesia Procedure Notes (Addendum)
Procedure Name: Intubation Date/Time: 06/04/2014 9:10 AM Performed by: Sampson Si E Pre-anesthesia Checklist: Patient identified, Emergency Drugs available, Suction available, Patient being monitored and Timeout performed Patient Re-evaluated:Patient Re-evaluated prior to inductionOxygen Delivery Method: Circle system utilized Preoxygenation: Pre-oxygenation with 100% oxygen Intubation Type: IV induction and Rapid sequence Ventilation: Mask ventilation without difficulty Laryngoscope Size: Miller Grade View: Grade I Tube type: Oral Tube size: 7.0 mm Number of attempts: 1 Airway Equipment and Method: Stylet and Video-laryngoscopy Placement Confirmation: ETT inserted through vocal cords under direct vision,  positive ETCO2 and breath sounds checked- equal and bilateral Secured at: 21 cm Tube secured with: Tape Dental Injury: Teeth and Oropharynx as per pre-operative assessment  Difficulty Due To: Difficulty was anticipated, Difficult Airway- due to large tongue and Difficult Airway- due to limited oral opening Future Recommendations: Recommend- induction with short-acting agent, and alternative techniques readily available

## 2014-06-04 NOTE — Anesthesia Preprocedure Evaluation (Addendum)
Anesthesia Evaluation  Patient identified by MRN, date of birth, ID band Patient awake    Reviewed: Allergy & Precautions, H&P , NPO status , Patient's Chart, lab work & pertinent test results, reviewed documented beta blocker date and time   Airway Mallampati: III TM Distance: <3 FB Neck ROM: Full    Dental  (+) Teeth Intact, Dental Advisory Given   Pulmonary sleep apnea and Continuous Positive Airway Pressure Ventilation , former smoker,  breath sounds clear to auscultation        Cardiovascular hypertension, Pt. on medications and Pt. on home beta blockers Rhythm:Regular Rate:Normal     Neuro/Psych Anxiety Depression    GI/Hepatic GERD-  Medicated and Controlled,(+) Hepatitis -, A  Endo/Other  Morbid obesity  Renal/GU      Musculoskeletal   Abdominal   Peds  Hematology   Anesthesia Other Findings   Reproductive/Obstetrics                        Anesthesia Physical Anesthesia Plan  ASA: III  Anesthesia Plan: General   Post-op Pain Management:    Induction: Intravenous, Rapid sequence and Cricoid pressure planned  Airway Management Planned: Oral ETT  Additional Equipment:   Intra-op Plan:   Post-operative Plan: Extubation in OR  Informed Consent: I have reviewed the patients History and Physical, chart, labs and discussed the procedure including the risks, benefits and alternatives for the proposed anesthesia with the patient or authorized representative who has indicated his/her understanding and acceptance.   Dental advisory given  Plan Discussed with: CRNA  Anesthesia Plan Comments:         Anesthesia Quick Evaluation

## 2014-06-04 NOTE — Anesthesia Postprocedure Evaluation (Signed)
  Anesthesia Post-op Note  Patient: Rebecca Bond  Procedure(s) Performed: Procedure(s): APPENDECTOMY LAPAROSCOPIC (N/A)  Patient Location: PACU  Anesthesia Type:General  Level of Consciousness: awake and alert   Airway and Oxygen Therapy: Patient Spontanous Breathing  Post-op Pain: mild  Post-op Assessment: Post-op Vital signs reviewed, Patient's Cardiovascular Status Stable and Respiratory Function Stable  Post-op Vital Signs: Reviewed  Filed Vitals:   06/04/14 1036  BP:   Pulse: 68  Temp:   Resp: 19    Complications: No apparent anesthesia complications

## 2014-06-04 NOTE — H&P (Signed)
Rebecca Bond is an 68 y.o. female.   Chief Complaint: right lower quadrant abdominal pain HPI: this is a 68 year old female with CLL who developed diarrhea on Monday. On Tuesday she started having upper abdominal discomfort which then moved to the right lower quadrant during the evening. The pain is sharp and constant as well as moderate in intensity. She has nausea but has had no emesis. She is otherwise without complaints.  Past Medical History  Diagnosis Date  . Benign essential HTN 11/22/2011  . CLL (chronic lymphocytic leukemia)   . Pneumonia 01/2010  . History of bronchitis 08/02/2012    "maybe once/yr if that much; last time 01/2010"  . Recurrent sinus infections 08/02/2012  . 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 High cholesterol   . 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 Prediabetes   . Anxiety   . Depression   . GERD (gastroesophageal reflux disease)   . OSA (obstructive sleep apnea)     Past Surgical History  Procedure Laterality Date  . Cholecystectomy  1985  . Tonsillectomy and adenoidectomy  1950's  . Breast surgery    . Lymph node biopsy      "determined I had CLL"  . Temporomandibular joint arthroplasty  1980's  . Functional endoscopic sinus surgery  1990's    "cause I kept having sinus infections"  . Abdominal hysterectomy  1980's    "endometrosis"    Family History  Problem Relation Age of Onset  . Parkinson's disease Mother   . Hypertension Mother   . Hypertension Maternal Grandmother   . Heart attack Maternal Grandmother     Mild   Social History:  reports that she quit smoking about 45 years ago. Her smoking use included Cigarettes. She has a 3 pack-year smoking history. She has never used smokeless tobacco. She reports that she drinks about 8.4 ounces of alcohol per week. She reports that she does not use illicit drugs.  Allergies:  Allergies  Allergen Reactions  . Hyzaar [Losartan Potassium-Hctz] Other (See Comments)     "messed up my sodium counts" swells up lips.  [name] [Pseudoephedrine Hcl] Palpitations  . Biaxin [Clarithromycin]     GI Upset  . Celexa [Citalopram]   . Flexeril [Cyclobenzaprine]     "zombie-like" feeling  . Fosamax [Alendronate Sodium]     GI upset  . Iohexol Hives     Code: HIVES, Desc: pt states she broke out in hive 20 yrs ago from IV contrast.     . Losartan     Angioedema  . Meloxicam     GI upset  . Norvasc [Amlodipine] Swelling  . Pseudoephedrine     Palpitations  . Zoloft [Sertraline Hcl]     Medications Prior to Admission  Medication Sig Dispense Refill  . ALPRAZolam (XANAX) 0.5 MG tablet Take 0.25 mg by mouth daily as needed. For anxiety      . Ascorbic Acid (VITAMIN C) 1000 MG tablet Take 1,000 mg by mouth daily.      [name] Kitchen buPROPion (WELLBUTRIN XL) 300 MG 24 hr tablet TAKE 1 TABLET BY MOUTH DAILY FOR MOOD AS DIRECTED  90 tablet  1  . Cholecalciferol (VITAMIN D PO) Take 1,000 Units by mouth 3 (three) times daily.      [name] Kitchen ezetimibe (ZETIA) 10 MG tablet Take 1 tablet (10 mg total) by mouth at bedtime.  30 tablet  6  . fexofenadine (ALLEGRA) 180 MG tablet Take 180 mg by mouth daily.      [name] Kitchen  furosemide (LASIX) 40 MG tablet Take 40 mg by mouth 2 (two) times daily as needed. For fluid and swelling.  Takes only as needed.      . hydrochlorothiazide (HYDRODIURIL) 12.5 MG tablet Take 1 tablet (12.5 mg total) by mouth daily.  90 tablet  3  . Nebivolol HCl (BYSTOLIC) 20 MG TABS Take 1.5 tablets (30 mg total) by mouth daily.  90 tablet  3  . OVER THE COUNTER MEDICATION Nasocort daily as needed        Results for orders placed during the hospital encounter of 06/03/14 (from the past 48 hour(s))  CBC WITH DIFFERENTIAL     Status: Abnormal   Collection Time    06/03/14  9:26 PM      Result Value Ref Range   WBC 55.0 (*) 4.0 - 10.5 K/uL   Comment: REPEATED TO VERIFY     CRITICAL RESULT CALLED TO, READ BACK BY AND VERIFIED WITH:     J DAVIS,RN 06/03/14 2207 RHOLMES   RBC  3.61 (*) 3.87 - 5.11 MIL/uL   Hemoglobin 12.2  12.0 - 15.0 g/dL   HCT 35.5 (*) 36.0 - 46.0 %   MCV 98.3  78.0 - 100.0 fL   MCH 33.8  26.0 - 34.0 pg   MCHC 34.4  30.0 - 36.0 g/dL   RDW 13.6  11.5 - 15.5 %   Platelets 141 (*) 150 - 400 K/uL   Neutrophils Relative % 13 (*) 43 - 77 %   Lymphocytes Relative 86 (*) 12 - 46 %   Monocytes Relative 1 (*) 3 - 12 %   Eosinophils Relative 0  0 - 5 %   Basophils Relative 0  0 - 1 %   Neutro Abs 7.2  1.7 - 7.7 K/uL   Lymphs Abs 47.2 (*) 0.7 - 4.0 K/uL   Monocytes Absolute 0.6  0.1 - 1.0 K/uL   Eosinophils Absolute 0.0  0.0 - 0.7 K/uL   Basophils Absolute 0.0  0.0 - 0.1 K/uL   WBC Morphology ABSOLUTE LYMPHOCYTOSIS     Comment: ATYPICAL LYMPHOCYTES  COMPREHENSIVE METABOLIC PANEL     Status: Abnormal   Collection Time    06/03/14  9:26 PM      Result Value Ref Range   Sodium 135 (*) 137 - 147 mEq/L   Potassium 3.9  3.7 - 5.3 mEq/L   Chloride 95 (*) 96 - 112 mEq/L   CO2 23  19 - 32 mEq/L   Glucose, Bld 101 (*) 70 - 99 mg/dL   BUN 11  6 - 23 mg/dL   Creatinine, Ser 0.77  0.50 - 1.10 mg/dL   Calcium 10.0  8.4 - 10.5 mg/dL   Total Protein 7.2  6.0 - 8.3 g/dL   Albumin 4.5  3.5 - 5.2 g/dL   AST 27  0 - 37 U/L   ALT 20  0 - 35 U/L   Alkaline Phosphatase 77  39 - 117 U/L   Total Bilirubin 0.5  0.3 - 1.2 mg/dL   GFR calc non Af Amer 85 (*) >90 mL/min   GFR calc Af Amer >90  >90 mL/min   Comment: (NOTE)     The eGFR has been calculated using the CKD EPI equation.     This calculation has not been validated in all clinical situations.     eGFR's persistently <90 mL/min signify possible Chronic Kidney     Disease.  LIPASE, BLOOD     Status: None  Collection Time    06/03/14  9:26 PM      Result Value Ref Range   Lipase 32  11 - 59 U/L  URINALYSIS, ROUTINE W REFLEX MICROSCOPIC     Status: Abnormal   Collection Time    06/04/14 12:52 AM      Result Value Ref Range   Color, Urine YELLOW  YELLOW   APPearance CLEAR  CLEAR   Specific Gravity,  Urine 1.017  1.005 - 1.030   pH 5.5  5.0 - 8.0   Glucose, UA NEGATIVE  NEGATIVE mg/dL   Hgb urine dipstick NEGATIVE  NEGATIVE   Bilirubin Urine NEGATIVE  NEGATIVE   Ketones, ur NEGATIVE  NEGATIVE mg/dL   Protein, ur NEGATIVE  NEGATIVE mg/dL   Urobilinogen, UA 0.2  0.0 - 1.0 mg/dL   Nitrite NEGATIVE  NEGATIVE   Leukocytes, UA SMALL (*) NEGATIVE  URINE MICROSCOPIC-ADD ON     Status: None   Collection Time    06/04/14 12:52 AM      Result Value Ref Range   Squamous Epithelial / LPF RARE  RARE   WBC, UA 3-6  <3 WBC/hpf   Bacteria, UA RARE  RARE   Ct Abdomen Pelvis W Contrast  06/04/2014   CLINICAL DATA:  Abdominal pain.  Epigastric pain.  EXAM: CT ABDOMEN AND PELVIS WITH CONTRAST  TECHNIQUE: Multidetector CT imaging of the abdomen and pelvis was performed using the standard protocol following bolus administration of intravenous contrast.  CONTRAST:  72m OMNIPAQUE IOHEXOL 300 MG/ML  SOLN  COMPARISON:  09/10/2013  FINDINGS: BODY WALL: Unremarkable.  LOWER CHEST: There is a lower right periesophageal lymph node which is larger than previous, now 9 mm in short axis. The finding is indeterminate as the neighboring esophagus does not appear thickened. Mild atelectasis in the lower lungs.  ABDOMEN/PELVIS:  Liver: 11 mm benign appearing lesion in the right hepatic lobe which is stable.  Biliary: Cholecystectomy.  Pancreas: Unremarkable.  Spleen: Unremarkable.  Adrenals: Unremarkable.  Kidneys and ureters: Punctate stone in the lower pole right kidney.  Bladder: Unremarkable.  Reproductive: Hysterectomy.  Bowel: The short appendix, projecting inferior from the cecum, is dilated at 13 mm. The lumen contains high-density material and the appendix shows mild surrounding inflammatory change. There is minimal peritoneal fluid in the pelvis and right lower quadrant which is considered reactive. No bowel obstruction. No abscess or other evidence of perforation.  Retroperitoneum: Enlarged bilateral inguinal and right  iliac nodes are stable over multiple years and considered benign.  Peritoneum: No pneumoperitoneum  Vascular: No acute abnormality.  OSSEOUS: Degenerative disc disease which is focally advanced at L2-3 and L3-4.  These results were called by telephone at the time of interpretation on 06/04/2014 at 2:02 AM to Dr. HDina Rich who verbally acknowledged these results.  IMPRESSION: 1. Acute appendicitis, non-perforated. 2. Tiny right renal calculus.   Electronically Signed   By: JJorje GuildM.D.   On: 06/04/2014 02:05    Review of Systems  All other systems reviewed and are negative.   Blood pressure 168/72, pulse 74, temperature 98.7 F (37.1 C), temperature source Oral, resp. rate 18, height '5\' 3"'  (1.6 m), weight 204 lb 1.6 oz (92.579 kg), SpO2 96.00%. Physical Exam  Constitutional: She appears well-developed and well-nourished. No distress.  obese  HENT:  Head: Normocephalic and atraumatic.  Right Ear: External ear normal.  Left Ear: External ear normal.  Nose: Nose normal.  Mouth/Throat: Oropharynx is clear and moist. No oropharyngeal exudate.  Eyes:  Conjunctivae are normal. Pupils are equal, round, and reactive to light. Right eye exhibits no discharge. Left eye exhibits no discharge. No scleral icterus.  Neck: Normal range of motion. Neck supple. No tracheal deviation present.  Cardiovascular: Normal rate, regular rhythm, normal heart sounds and intact distal pulses.   No murmur heard. Respiratory: Effort normal and breath sounds normal. No respiratory distress. She has no wheezes.  GI: Soft. There is tenderness. There is guarding.  Moderate tenderness with guarding in the right lower quadrant  Musculoskeletal: Normal range of motion.  Lymphadenopathy:    She has no cervical adenopathy.  Neurological: She is alert.  Skin: Skin is warm and dry. No rash noted. She is not diaphoretic. No erythema.  Psychiatric: Her behavior is normal. Judgment normal.     Assessment/Plan Acute  appendicitis  I discussed the diagnosis of the patient and her husband. Appendectomy is recommended. I discussed the laparoscopic approach with her.  I discussed the risks which includes but is not limited to bleeding, infection, injury to surrounding structures, need to convert to procedure, cardiopulmonary issues, DVT, etc. She understands and wishes to proceed. Surgery will be performed by Dr. Georganna Skeans today. All questions were answered.  Euriah Matlack A 06/04/2014, 5:53 AM

## 2014-06-04 NOTE — Progress Notes (Signed)
Checked with pt about CPAP; pt uses nasal pillows at home and states she is "claustrophobic", so would be unable to wear the masks available here. Pt states she went without wearing CPAP last night and does not need it tonight. RN aware.

## 2014-06-04 NOTE — Transfer of Care (Signed)
Immediate Anesthesia Transfer of Care Note  Patient: Rebecca Bond  Procedure(s) Performed: Procedure(s): APPENDECTOMY LAPAROSCOPIC (N/A)  Patient Location: PACU  Anesthesia Type:General  Level of Consciousness: awake, alert  and oriented  Airway & Oxygen Therapy: Patient Spontanous Breathing and Patient connected to nasal cannula oxygen  Post-op Assessment: Report given to PACU RN  Post vital signs: Reviewed and stable  Complications: No apparent anesthesia complications

## 2014-06-04 NOTE — Op Note (Signed)
06/03/2014 - 06/04/2014  9:41 AM  PATIENT:  Rebecca Bond  68 y.o. female  PRE-OPERATIVE DIAGNOSIS:  ACUTE APPENDICITIS  POST-OPERATIVE DIAGNOSIS:  ACUTE APPENDICITIS  PROCEDURE:  Procedure(s): APPENDECTOMY LAPAROSCOPIC  SURGEON:  Surgeon(s): Zenovia Jarred, MD  ASSISTANTS: Judyann Munson, RNFA   ANESTHESIA:   local and general  EBL:  Total I/O In: 500 [I.V.:500] Out: -   BLOOD ADMINISTERED:none  DRAINS: none   SPECIMEN:  Excision  DISPOSITION OF SPECIMEN:  PATHOLOGY  COUNTS:  YES  DICTATION: Viviann Spare Dictation  Patient is brought for laparoscopic appendectomy. She was identified in the preop holding area. Informed consent was obtained. She is receiving intravenous antibiotics scheduled. She was brought to the operating room and general endotracheal anesthesia was administered by the anesthesia staff. Foley catheter was placed by nursing. Abdomen was prepped and draped in sterile fashion. We did time out procedure. Infraumbilical region was infiltrated with local. Incision was made along her previous scar. Subcutaneous tissues were dissected down revealing the anterior fascia. This was divided sharply along the midline. The perineal cavity was carefully entered under direct vision. 0 Vicryl pursestring was placed on the fascial opening. Hassan trocar was inserted. Abdomen was insufflated with carbon monoxide in standard fashion. Under direct vision, a 12 mm left lower quadrant a 5 mm right mid abdomen port were placed. Local was used at each port site. Exploration revealed an acutely inflamed appendix with some murky fluid but no perforation noted. The mesoappendix was divided with the harmonic scalpel achieving good hemostasis. The base of the appendix was intact. The base was divided with Endo GIA with vascular load. Staple line was excellent. Appendix was placed in an Endo Catch bag and removed from the left lower quadrant port site. Abdomen was copiously irrigated. There was  no bleeding. Staple line was intact. Irrigation fluid returned clear. Pneumoperitoneum was released. Ports were removed. Informed local fascia was closed by tying the pursestring. All 3 wounds were copiously irrigated and the skin of each was closed with running 4 Vicryl subcuticular stitch followed by Dermabond. All counts were correct. Patient tolerated procedure well without apparent complication was taken recovery in stable condition.  PATIENT DISPOSITION:  PACU - hemodynamically stable.   Delay start of Pharmacological VTE agent (>24hrs) due to surgical blood loss or risk of bleeding:  no  Georganna Skeans, MD, MPH, FACS Pager: [phone]  7/1/20159:41 AM

## 2014-06-04 NOTE — ED Provider Notes (Signed)
Signed out by PA pending CT to r/o SBO.  CT scan with evidence of acute appendicitis. No leukocytosis or fever. On repeat exam, patient continues to have tenderness without rebound or guarding. Mostly periumbilical pain. We'll consult general surgery. Anticipate admission.  Results for orders placed during the hospital encounter of 06/03/14  CBC WITH DIFFERENTIAL      Result Value Ref Range   WBC 55.0 (*) 4.0 - 10.5 K/uL   RBC 3.61 (*) 3.87 - 5.11 MIL/uL   Hemoglobin 12.2  12.0 - 15.0 g/dL   HCT 35.5 (*) 36.0 - 46.0 %   MCV 98.3  78.0 - 100.0 fL   MCH 33.8  26.0 - 34.0 pg   MCHC 34.4  30.0 - 36.0 g/dL   RDW 13.6  11.5 - 15.5 %   Platelets 141 (*) 150 - 400 K/uL   Neutrophils Relative % 13 (*) 43 - 77 %   Lymphocytes Relative 86 (*) 12 - 46 %   Monocytes Relative 1 (*) 3 - 12 %   Eosinophils Relative 0  0 - 5 %   Basophils Relative 0  0 - 1 %   Neutro Abs 7.2  1.7 - 7.7 K/uL   Lymphs Abs 47.2 (*) 0.7 - 4.0 K/uL   Monocytes Absolute 0.6  0.1 - 1.0 K/uL   Eosinophils Absolute 0.0  0.0 - 0.7 K/uL   Basophils Absolute 0.0  0.0 - 0.1 K/uL   WBC Morphology ABSOLUTE LYMPHOCYTOSIS    COMPREHENSIVE METABOLIC PANEL      Result Value Ref Range   Sodium 135 (*) 137 - 147 mEq/L   Potassium 3.9  3.7 - 5.3 mEq/L   Chloride 95 (*) 96 - 112 mEq/L   CO2 23  19 - 32 mEq/L   Glucose, Bld 101 (*) 70 - 99 mg/dL   BUN 11  6 - 23 mg/dL   Creatinine, Ser 0.77  0.50 - 1.10 mg/dL   Calcium 10.0  8.4 - 10.5 mg/dL   Total Protein 7.2  6.0 - 8.3 g/dL   Albumin 4.5  3.5 - 5.2 g/dL   AST 27  0 - 37 U/L   ALT 20  0 - 35 U/L   Alkaline Phosphatase 77  39 - 117 U/L   Total Bilirubin 0.5  0.3 - 1.2 mg/dL   GFR calc non Af Amer 85 (*) >90 mL/min   GFR calc Af Amer >90  >90 mL/min  LIPASE, BLOOD      Result Value Ref Range   Lipase 32  11 - 59 U/L  URINALYSIS, ROUTINE W REFLEX MICROSCOPIC      Result Value Ref Range   Color, Urine YELLOW  YELLOW   APPearance CLEAR  CLEAR   Specific Gravity, Urine 1.017   1.005 - 1.030   pH 5.5  5.0 - 8.0   Glucose, UA NEGATIVE  NEGATIVE mg/dL   Hgb urine dipstick NEGATIVE  NEGATIVE   Bilirubin Urine NEGATIVE  NEGATIVE   Ketones, ur NEGATIVE  NEGATIVE mg/dL   Protein, ur NEGATIVE  NEGATIVE mg/dL   Urobilinogen, UA 0.2  0.0 - 1.0 mg/dL   Nitrite NEGATIVE  NEGATIVE   Leukocytes, UA SMALL (*) NEGATIVE  URINE MICROSCOPIC-ADD ON      Result Value Ref Range   Squamous Epithelial / LPF RARE  RARE   WBC, UA 3-6  <3 WBC/hpf   Bacteria, UA RARE  RARE   Ct Abdomen Pelvis W Contrast  06/04/2014   CLINICAL  DATA:  Abdominal pain.  Epigastric pain.  EXAM: CT ABDOMEN AND PELVIS WITH CONTRAST  TECHNIQUE: Multidetector CT imaging of the abdomen and pelvis was performed using the standard protocol following bolus administration of intravenous contrast.  CONTRAST:  45mL OMNIPAQUE IOHEXOL 300 MG/ML  SOLN  COMPARISON:  09/10/2013  FINDINGS: BODY WALL: Unremarkable.  LOWER CHEST: There is a lower right periesophageal lymph node which is larger than previous, now 9 mm in short axis. The finding is indeterminate as the neighboring esophagus does not appear thickened. Mild atelectasis in the lower lungs.  ABDOMEN/PELVIS:  Liver: 11 mm benign appearing lesion in the right hepatic lobe which is stable.  Biliary: Cholecystectomy.  Pancreas: Unremarkable.  Spleen: Unremarkable.  Adrenals: Unremarkable.  Kidneys and ureters: Punctate stone in the lower pole right kidney.  Bladder: Unremarkable.  Reproductive: Hysterectomy.  Bowel: The short appendix, projecting inferior from the cecum, is dilated at 13 mm. The lumen contains high-density material and the appendix shows mild surrounding inflammatory change. There is minimal peritoneal fluid in the pelvis and right lower quadrant which is considered reactive. No bowel obstruction. No abscess or other evidence of perforation.  Retroperitoneum: Enlarged bilateral inguinal and right iliac nodes are stable over multiple years and considered benign.   Peritoneum: No pneumoperitoneum  Vascular: No acute abnormality.  OSSEOUS: Degenerative disc disease which is focally advanced at L2-3 and L3-4.  These results were called by telephone at the time of interpretation on 06/04/2014 at 2:02 AM to Dr. Dina Rich, who verbally acknowledged these results.  IMPRESSION: 1. Acute appendicitis, non-perforated. 2. Tiny right renal calculus.   Electronically Signed   By: Jorje Guild M.D.   On: 06/04/2014 02:05     Merryl Hacker, MD 06/04/14 [phone]

## 2014-06-05 DIAGNOSIS — K3580 Unspecified acute appendicitis: Secondary | ICD-10-CM | POA: Diagnosis not present

## 2014-06-05 MED ORDER — OXYCODONE HCL 5 MG PO TABS: 5.0000 mg | ORAL_TABLET | ORAL | Status: DC | PRN

## 2014-06-05 NOTE — Discharge Instructions (Signed)

## 2014-06-05 NOTE — Progress Notes (Signed)
Patient discharged to home with instructions. 

## 2014-06-05 NOTE — Discharge Summary (Signed)
Rebecca Bouse, MD, MPH, FACS Trauma: [phone] General Surgery: [phone]  

## 2014-06-05 NOTE — Discharge Summary (Signed)
Physician Discharge Summary  Patient ID: [name] MRN: [id number] DOB/AGE: 1946-08-21 68 y.o.  Admit date: 06/03/2014 Discharge date: 06/05/2014  Admitting Diagnosis: Acute appendicitis   Discharge Diagnosis Patient Active Problem List   Diagnosis Date Noted  . Appendicitis 06/04/2014  . HTN (hypertension) 01/08/2014  . Edema 01/08/2014  . Prediabetes   . Anxiety   . Depression   . GERD (gastroesophageal reflux disease)   . OSA (obstructive sleep apnea)   . Edema of both legs 12/25/2013  . Angioedema secondary to ACE/ARB 12/25/2013  . 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 Morbid obesity 12/17/2013  . 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 Sleep apnea, obstructive 05/06/2013  . Hepatitis A 08/02/2012  . Recurrent sinus infections 08/02/2012  . CLL (chronic lymphocytic leukemia) 11/22/2011  . Benign essential HTN 11/22/2011    Consultants none  Imaging: Ct Abdomen Pelvis W Contrast  06/04/2014   CLINICAL DATA:  Abdominal pain.  Epigastric pain.  EXAM: CT ABDOMEN AND PELVIS WITH CONTRAST  TECHNIQUE: Multidetector CT imaging of the abdomen and pelvis was performed using the standard protocol following bolus administration of intravenous contrast.  CONTRAST:  70mL OMNIPAQUE IOHEXOL 300 MG/ML  SOLN  COMPARISON:  09/10/2013  FINDINGS: BODY WALL: Unremarkable.  LOWER CHEST: There is a lower right periesophageal lymph node which is larger than previous, now 9 mm in short axis. The finding is indeterminate as the neighboring esophagus does not appear thickened. Mild atelectasis in the lower lungs.  ABDOMEN/PELVIS:  Liver: 11 mm benign appearing lesion in the right hepatic lobe which is stable.  Biliary: Cholecystectomy.  Pancreas: Unremarkable.  Spleen: Unremarkable.  Adrenals: Unremarkable.  Kidneys and ureters: Punctate stone in the lower pole right kidney.  Bladder: Unremarkable.  Reproductive: Hysterectomy.  Bowel: The short appendix, projecting inferior from the cecum,  is dilated at 13 mm. The lumen contains high-density material and the appendix shows mild surrounding inflammatory change. There is minimal peritoneal fluid in the pelvis and right lower quadrant which is considered reactive. No bowel obstruction. No abscess or other evidence of perforation.  Retroperitoneum: Enlarged bilateral inguinal and right iliac nodes are stable over multiple years and considered benign.  Peritoneum: No pneumoperitoneum  Vascular: No acute abnormality.  OSSEOUS: Degenerative disc disease which is focally advanced at L2-3 and L3-4.  These results were called by telephone at the time of interpretation on 06/04/2014 at 2:02 AM to Dr. Dina Rich, who verbally acknowledged these results.  IMPRESSION: 1. Acute appendicitis, non-perforated. 2. Tiny right renal calculus.   Electronically Signed   By: Jorje Guild M.D.   On: 06/04/2014 02:05    Procedures Laparoscopic appendectomy---Dr. Dennis Bast 06/04/14  Hospital Course:  Rebecca Bond is a 68 year old female with a history of CLL  who presented to [facility] with RLQ abdominal pain and diarrhea..  Workup showed acute appendicitis.  Patient was admitted and underwent procedure listed above.  Tolerated procedure well and was transferred to the floor.  Diet was advanced as tolerated. She was kept on IV antibiotics empirically, she was not perforated.  On POD#1, the patient was voiding well, tolerating diet, ambulating well, pain well controlled, vital signs stable, incisions c/d/i and felt stable for discharge home.  Medication risks, benefits and therapeutic alternatives were reviewed with the patient.  She verbalizes understanding. i provided her with a return to work letter in 1 week, she may call back if she does not feel ready and we will extend for an additional week. Patient will follow up in our  office in 3 weeks and knows to call with questions or concerns.  Physical Exam: General:  Alert, NAD, pleasant, comfortable Abd:  Soft, ND, mild  tenderness, incisions C/D/I    Medication List         ALPRAZolam 0.5 MG tablet  Commonly known as:  XANAX  Take 0.25 mg by mouth daily as needed. For anxiety     buPROPion 300 MG 24 hr tablet  Commonly known as:  WELLBUTRIN XL  TAKE 1 TABLET BY MOUTH DAILY FOR MOOD AS DIRECTED     ezetimibe 10 MG tablet  Commonly known as:  ZETIA  Take 1 tablet (10 mg total) by mouth at bedtime.     fexofenadine 180 MG tablet  Commonly known as:  ALLEGRA  Take 180 mg by mouth daily.     furosemide 40 MG tablet  Commonly known as:  LASIX  Take 40 mg by mouth 2 (two) times daily as needed. For fluid and swelling.  Takes only as needed.     hydrochlorothiazide 12.5 MG tablet  Commonly known as:  HYDRODIURIL  Take 1 tablet (12.5 mg total) by mouth daily.     Nebivolol HCl 20 MG Tabs  Commonly known as:  BYSTOLIC  Take 1.5 tablets (30 mg total) by mouth daily.     OVER THE COUNTER MEDICATION  Nasocort daily as needed     oxyCODONE 5 MG immediate release tablet  Commonly known as:  Oxy IR/ROXICODONE  Take 1-2 tablets (5-10 mg total) by mouth every 4 (four) hours as needed (pain).     vitamin C 1000 MG tablet  Take 1,000 mg by mouth daily.     VITAMIN D PO  Take 1,000 Units by mouth 3 (three) times daily.             Follow-up Information   Follow up with [facility] On 06/24/2014. (arrive by 3:15PM for a 3:45PM post operative check up)    Contact information:   Rebecca Bond   [place] [address] [phone]       Signed: Erby Pian, [facility] [phone]  06/05/2014, 9:17 AM

## 2014-06-07 ENCOUNTER — Encounter: Payer: Self-pay | Admitting: Oncology

## 2014-06-07 NOTE — Progress Notes (Signed)
OFFICE PROGRESS NOTE   06/02/2014  Physicians: (J.Granfortuna, N.Gorsuch), W.McKeown, Shelva Majestic, A.Ross (gyn), F.Lupton  INTERVAL HISTORY:  Patient is seen, alone for visit and for the first time by this MD, previously followed at this office by Dr Beryle Beams for CLL, and also having seen Dr Alvy Bimler in 03-2014. WBC was markedly higher in April than previously, with new slight anemia as well as upper respiratory infections and apparent B symptoms, such that Dr Alvy Bimler discussed beginning treatment for the CLL.  She does not seem to have had any worsening of CLL symptoms or complications since the visit in April. She states last sinus infection was in 03-2014. Primary care is by Dr Melford Aase, who has followed her for years and will see her again in 09-2014.   HEMATOLOGY/ONCOLOGY HISTORY Patient was diagnosed with stage I CLL in 11-2008 by biopsy of left axillary lymph node found on routine mammograms. She has not required treatment for the CLL, however does have fluctuations in WBC at times related to intermittent steroids used for recurrent sinusitis and bronchitis. She has not had bulky adenopathy, previous anemia or thrombocytopenia.    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 Sinus surgery Hysterectomy with oophorectomy 1986 for endometriosis TMJ surgery Dr D.Carpenter CPAP by Dr Corky Downs for obstructive sleep apnea TSH normal 04-2014 HTN Obstructive sleep apnea Hepatitis A Elevated cholesterol GERD Prediabetes Tonsils/ adenoids Cholecystectomy  Multiple medication allergies as listed  Social History: married, 1 child. 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 Remote minimal tobacco (x4 years quit 1970). Wine several times weekly.  Family History Parkinson's and HTN mother   Review of systems as above, also: No fever or present symptoms of  infection. "Sweats" intermittently day and night, by description suggest hot flashes tho patient does not feel that is true; these are not drenching at night/ not worse at night. CPAP very helpful since 07-2013. Diarrhea with magnesium supplements in 05-2014, none since that was stopped. "Sick off and on all last winter", generally getting illnesses from coworkers at office as everyone works in common area. No new or different pain. No bleeding. No new adenopathy. Energy and appetite at baseline. No increased SOB. Remainder of 10 point Review of Systems negative.  Objective:  Vital signs in last 24 hours:  BP 187/78  Pulse 67  Temp(Src) 98.1 F (36.7 C) (Oral)  Resp 18  Ht 5' 2.5" (1.588 m)  Wt 206 lb 1.6 oz (93.486 kg)  BMI 37.07 kg/m2 weight is up 5 lbs.  Alert, oriented and appropriate. Ambulatory without difficulty. NAD.   HEENT:PERRL, sclerae not icteric. Oral mucosa moist without lesions, posterior pharynx clear.  Neck supple. No JVD.  Lymphatics:no cervical,suraclavicular, or inguinal adenopathy. Soft left axillary node~ 1.5 cm. Resp: clear to auscultation bilaterally and normal percussion bilaterally Cardio: regular rate and rhythm. No gallop. GI: soft, nontender, not distended, no mass or organomegaly. Normally active bowel sounds.  Musculoskeletal/ Extremities: without pitting edema, cords, tenderness Neuro: no peripheral neuropathy. Otherwise nonfocal Skin without rash, ecchymosis, petechiae Breasts: without dominant mass, skin or nipple findings.  Lab Results: CBC diff today: WBC down to 45.1, having been 122 - 123k  In 04-2014 and 03-2014, previously 51.6 in 01-2014 and 83k in 08-2013.  Hgb a little higher at 11.6 and plt 1543k. MCV 102, RDW 14.3,  ANC 5.9, lymphs CMET today entirely normal, including LFTs and creatinine 0.9 QIG available after visit  IgG low at 337, IgA low at 31 and IgM low at 18 (compared with 2 years ago [phone] and 5 years ago [phone]) Folate 585, B12  346, haptoglobin 154 LDH 211  Studies/Results:  No results found. Last mammograms 09-24-13. Last CT AP 2011, [place] abd 09-2012 with spleen 5.4 cm  Medications: I have reviewed the patient's current medications.  DISCUSSION: We have discussed changes in her blood counts in 03-2014 which were appropriately of concern to Dr Alvy Bimler, together with history of recurrent respiratory infections and some of patient's other complaints. The increased WBC may have been related to sinus infection then, as it is not back in more usual range for her, without worsening of other counts. I have explained that her immunoglobulins have been low previously, likely contributing to recurrent infections; both Drs Beryle Beams and Alvy Bimler mentioned possibility of low dose IV immune globulins. I did not discuss the IV immune globulins further at visit, with repeat labs pending. I recommended that she request separate work space due to frequent illness in coworkers, and have written note to this effect for her employer. I have explained that I am a medical oncologist and that a hematologist with this group or elsewhere may be best to manage her care if she does need CLL treatment. I will see her back in 3 months with labs.   Assessment/Plan: 1.CLL with hypogammaglobulinemia: initial diagnosis 11-2008, no treatment to date. Marked leukocytosis in 03-2014 improved back to more usual range for her, with no worsening of other counts. Quantitative immune globulins all low, consider IVIG if patient agreeable. She certainly needs flu vaccine yearly. 2.recurrent sinusitis and bronchitis 3.hepatitis A 4.HTN, elevated cholesterol 5.GERD 6.obstructive sleep apnea    Time spent 30 min including >50% counseling and coordination of care   Gordy Levan, MD

## 2014-06-09 ENCOUNTER — Encounter (HOSPITAL_COMMUNITY): Payer: Self-pay | Admitting: General Surgery

## 2014-06-10 LAB — TISSUE HYBRIDIZATION TO NCBH

## 2014-06-10 NOTE — ED Provider Notes (Signed)
Medical screening examination/treatment/procedure(s) were performed by non-physician practitioner and as supervising physician I was immediately available for consultation/collaboration.   EKG Interpretation   Date/Time:  Tuesday June 03 2014 23:03:59 EDT Ventricular Rate:  70 PR Interval:  140 QRS Duration: 92 QT Interval:  414 QTC Calculation: 447 R Axis:   35 Text Interpretation:  Normal sinus rhythm Nonspecific ST abnormality  Abnormal ECG ED PHYSICIAN INTERPRETATION AVAILABLE IN [facility]  Confirmed by TEST, Record ([id number]) on 06/05/2014 7:06:12 AM        Tanna Furry, MD 06/10/14 [phone]

## 2014-06-11 ENCOUNTER — Encounter (INDEPENDENT_AMBULATORY_CARE_PROVIDER_SITE_OTHER): Payer: Self-pay

## 2014-06-24 ENCOUNTER — Ambulatory Visit (INDEPENDENT_AMBULATORY_CARE_PROVIDER_SITE_OTHER): Payer: 59 | Admitting: General Surgery

## 2014-06-24 ENCOUNTER — Encounter (INDEPENDENT_AMBULATORY_CARE_PROVIDER_SITE_OTHER): Payer: Self-pay | Admitting: General Surgery

## 2014-06-24 ENCOUNTER — Encounter (INDEPENDENT_AMBULATORY_CARE_PROVIDER_SITE_OTHER): Payer: Self-pay | Admitting: *Deleted

## 2014-06-24 VITALS — BP 170/98 | HR 72 | Temp 98.1°F | Resp 15 | Ht 63.0 in | Wt 201.0 lb

## 2014-06-24 DIAGNOSIS — K3580 Unspecified acute appendicitis: Secondary | ICD-10-CM

## 2014-06-24 LAB — FISH, PERIPHERAL BLOOD

## 2014-06-24 NOTE — Patient Instructions (Signed)
Call us if you have any issues with your surgery.

## 2014-06-24 NOTE — Progress Notes (Signed)
Rebecca Bond [facility] 11-22-1946 [id number] 06/24/2014   History of Present Illness: Rebecca Bond is a  68 y.o. female who presents today status post lap appy by Dr. Georganna Skeans.   Pathology reveals : Appendix, Other than Incidental - ACUTE SUPPURATIVE APPENDICITIS   The patient is tolerating a regular diet, having normal bowel movements, has good pain control.  She  is back to most normal activities.   Physical Exam:  Temp(Src) 98.1 F (36.7 C) (Oral)  Ht 5\' 3"  (1.6 m)  Wt 91.173 kg (201 lb)  BMI 35.61 kg/m2  Abd: soft, nontender, active bowel sounds, nondistended.  All incisions are well healed. Having allot of abdominal and back pain.  Very anxious about breaking a stitch.  She has some ecchymosis over the RLQ possibly from the Lovenox/heparin injections. Impression: 1.  Acute appendicitis, s/p lap appy   Plan: She  is able to return to normal activities. She may follow up on a prn basis.

## 2014-07-13 ENCOUNTER — Other Ambulatory Visit: Payer: Self-pay | Admitting: Internal Medicine

## 2014-08-14 ENCOUNTER — Telehealth: Payer: Self-pay | Admitting: *Deleted

## 2014-08-14 NOTE — Telephone Encounter (Signed)
Patient called and asked for note saying patient's health OK to join an exercise program at the gym.  Note faxed to [facility] per Dr Melford [phone])

## 2014-09-14 ENCOUNTER — Other Ambulatory Visit: Payer: Self-pay | Admitting: Oncology

## 2014-09-14 DIAGNOSIS — C9110 Chronic lymphocytic leukemia of B-cell type not having achieved remission: Secondary | ICD-10-CM

## 2014-09-15 ENCOUNTER — Other Ambulatory Visit (HOSPITAL_BASED_OUTPATIENT_CLINIC_OR_DEPARTMENT_OTHER): Payer: 59

## 2014-09-15 ENCOUNTER — Ambulatory Visit (HOSPITAL_BASED_OUTPATIENT_CLINIC_OR_DEPARTMENT_OTHER): Payer: 59 | Admitting: Oncology

## 2014-09-15 ENCOUNTER — Encounter: Payer: Self-pay | Admitting: Oncology

## 2014-09-15 ENCOUNTER — Other Ambulatory Visit: Payer: Self-pay | Admitting: Oncology

## 2014-09-15 VITALS — BP 161/69 | HR 68 | Temp 98.4°F | Resp 20 | Ht 63.0 in | Wt 204.2 lb

## 2014-09-15 DIAGNOSIS — B159 Hepatitis A without hepatic coma: Secondary | ICD-10-CM

## 2014-09-15 DIAGNOSIS — C9110 Chronic lymphocytic leukemia of B-cell type not having achieved remission: Secondary | ICD-10-CM

## 2014-09-15 DIAGNOSIS — Z23 Encounter for immunization: Secondary | ICD-10-CM

## 2014-09-15 DIAGNOSIS — R300 Dysuria: Secondary | ICD-10-CM

## 2014-09-15 DIAGNOSIS — D801 Nonfamilial hypogammaglobulinemia: Secondary | ICD-10-CM

## 2014-09-15 DIAGNOSIS — E669 Obesity, unspecified: Secondary | ICD-10-CM

## 2014-09-15 LAB — CBC WITH DIFFERENTIAL/PLATELET
BASO%: 0.3 % (ref 0.0–2.0)
BASOS ABS: 0.1 10*3/uL (ref 0.0–0.1)
EOS%: 0.4 % (ref 0.0–7.0)
Eosinophils Absolute: 0.2 10*3/uL (ref 0.0–0.5)
HCT: 35.6 % (ref 34.8–46.6)
HGB: 11.7 g/dL (ref 11.6–15.9)
LYMPH#: 44.6 10*3/uL — AB (ref 0.9–3.3)
LYMPH%: 85.8 % — AB (ref 14.0–49.7)
MCH: 31.7 pg (ref 25.1–34.0)
MCHC: 32.8 g/dL (ref 31.5–36.0)
MCV: 96.6 fL (ref 79.5–101.0)
MONO#: 1 10*3/uL — ABNORMAL HIGH (ref 0.1–0.9)
MONO%: 1.9 % (ref 0.0–14.0)
NEUT%: 11.6 % — AB (ref 38.4–76.8)
NEUTROS ABS: 6 10*3/uL (ref 1.5–6.5)
Platelets: 146 10*3/uL (ref 145–400)
RBC: 3.69 10*6/uL — AB (ref 3.70–5.45)
RDW: 14.1 % (ref 11.2–14.5)
WBC: 51.9 10*3/uL (ref 3.9–10.3)

## 2014-09-15 LAB — LACTATE DEHYDROGENASE (CC13): LDH: 201 U/L (ref 125–245)

## 2014-09-15 LAB — COMPREHENSIVE METABOLIC PANEL (CC13)
ALBUMIN: 4.1 g/dL (ref 3.5–5.0)
ALT: 26 U/L (ref 0–55)
AST: 25 U/L (ref 5–34)
Alkaline Phosphatase: 74 U/L (ref 40–150)
Anion Gap: 12 mEq/L — ABNORMAL HIGH (ref 3–11)
BUN: 15.7 mg/dL (ref 7.0–26.0)
CALCIUM: 9.4 mg/dL (ref 8.4–10.4)
CO2: 23 mEq/L (ref 22–29)
Chloride: 97 mEq/L — ABNORMAL LOW (ref 98–109)
Creatinine: 0.8 mg/dL (ref 0.6–1.1)
GLUCOSE: 103 mg/dL (ref 70–140)
POTASSIUM: 3.5 meq/L (ref 3.5–5.1)
Sodium: 131 mEq/L — ABNORMAL LOW (ref 136–145)
Total Bilirubin: 0.55 mg/dL (ref 0.20–1.20)
Total Protein: 6.8 g/dL (ref 6.4–8.3)

## 2014-09-15 LAB — URINALYSIS, MICROSCOPIC - CHCC
BLOOD: NEGATIVE
Bilirubin (Urine): NEGATIVE
Glucose: NEGATIVE mg/dL
Ketones: NEGATIVE mg/dL
NITRITE: NEGATIVE
PH: 6 (ref 4.6–8.0)
Protein: NEGATIVE mg/dL
RBC / HPF: NEGATIVE (ref 0–2)
SPECIFIC GRAVITY, URINE: 1.005 (ref 1.003–1.035)
Urobilinogen, UR: 0.2 mg/dL (ref 0.2–1)

## 2014-09-15 LAB — TECHNOLOGIST REVIEW

## 2014-09-15 MED ORDER — INFLUENZA VAC SPLIT QUAD 0.5 ML IM SUSY
0.5000 mL | PREFILLED_SYRINGE | Freq: Once | INTRAMUSCULAR | Status: AC
  Administered 2014-09-15: 0.5 mL via INTRAMUSCULAR
  Filled 2014-09-15: qty 0.5

## 2014-09-15 NOTE — Progress Notes (Signed)
OFFICE PROGRESS NOTE   09/15/2014   Physicians:(J.Granfortuna, N.Gorsuch), W.McKeown, Shelva Majestic, A.Ross (gyn), F.Seth Bake   INTERVAL HISTORY:  Patient is seen, alone for visit   ONCOLOGIC HISTORY Patient was diagnosed with stage I CLL in 11-2008 by biopsy of left axillary lymph node found on routine mammograms. She has not required treatment for the CLL, however does have fluctuations in WBC at times related to intermittent steroids used for recurrent sinusitis and bronchitis. She has not had bulky adenopathy. She has had recurrent sinusitis and bronchitis, as well as hospitalization for pneumonia in 2012.Quantitative immune globulins have been low, with IgG <500 for at least 3 years, and have continued to decrease on most recent lab 06-02-14. She had acute appendicitis shortly after she was here last, with laparoscopic appendectomy by Dr Georganna Skeans on 06-04-14. CT AP done in ED on 06-04-14 showed stable enlargement of bilateral inguinal and right iliac nodes without splenomegaly; on views of lower chest included in this exam, she has larger lower right periesophageal lymph node presently 23m.  She is to see PCP Dr MMelford Aasenext on 09-30-14.  Patient complains of dysuria x 1 week, without fever. She has had no upper or lower respiratory infections since she was here last. She is not aware of increasing peripheral adenopathy. She has recovered well from the appendectomy. Energy seems at baseline. She denies bleeding or bruising. She has intermittent daytime symptoms that suggest hot flashes, never has drenching night sweats. Appetite good. Her work has not moved her to a private office, which I had suggested due to compromised immune status and history of contracting frequent respiratory infections from ill co-workers last winter. I gave her note for her work at last visit in this regard.   She does not have PAC She received flu vaccine today    Review of systems as above,  also: Nipples itch x 6 months, uses vaseline with no significant improvement. Not aware of any changes in breasts otherwise, no nipple discharge. [facility] [place] [facility] [facility] [place]..Allergic eye irritation improved with recent steroid drops x 2 weeks  by Dr MMelford Aase  Remainder of 10 point Review of Systems negative.  Hematology/ Oncology history Patient was diagnosed with stage I CLL in 11-2008 by biopsy of left axillary lymph node found on routine mammograms. She has not required treatment for the CLL, however does have fluctuations in WBC at times related to intermittent steroids used for recurrent sinusitis and bronchitis. [facility]- (no deletions ATM, CEP 12, 13q34 or p53).  She has not had bulky adenopathy, previous anemia or thrombocytopenia.    Objective:  Vital signs in last 24 hours:  BP 161/69  Pulse 68  Temp(Src) 98.4 F (36.9 C) (Oral)  Resp 20  Ht '5\' 3"'  (1.6 m)  Wt 204 lb 3.2 oz (92.625 kg)  BMI 36.18 kg/m2 weight is down 2 lbs  Alert, oriented and appropriate. Ambulatory without difficulty.  No alopecia  HEENT:PERRL, sclerae not icteric. Oral mucosa moist without lesions, posterior pharynx clear.  Neck supple. No JVD.  Lymphatics:no cervical,suraclavicular, or inguinal adenopathy. Unchanged soft left axillary node ~ 1.5 cm, mobile, not tender Resp: clear to auscultation bilaterally and normal percussion bilaterally Cardio: regular rate and rhythm. No gallop. GI: abdomen obese, soft, nontender including LLQ, not distended, no mass or organomegaly. Normally active bowel sounds. Laparoscopic surgical incision from recent appendectomy well healed. Musculoskeletal/ Extremities: without pitting edema, cords, tenderness Neuro: no peripheral neuropathy. Otherwise nonfocal Skin  without rash, ecchymosis, petechiae. No skin changes apparent nipples or areola Breasts: bilaterally without dominant mass. Nothing of obvious concern at  nipples, no discharge. Axillae benign.   Lab Results:  Results for orders placed in visit on 09/15/14  TECHNOLOGIST REVIEW      Result Value Ref Range   Technologist Review Variant lymphs present,smudge cells    COMPREHENSIVE METABOLIC PANEL (GS81)      Result Value Ref Range   Sodium 131 (*) 136 - 145 mEq/L   Potassium 3.5  3.5 - 5.1 mEq/L   Chloride 97 (*) 98 - 109 mEq/L   CO2 23  22 - 29 mEq/L   Glucose 103  70 - 140 mg/dl   BUN 15.7  7.0 - 26.0 mg/dL   Creatinine 0.8  0.6 - 1.1 mg/dL   Total Bilirubin 0.55  0.20 - 1.20 mg/dL   Alkaline Phosphatase 74  40 - 150 U/L   AST 25  5 - 34 U/L   ALT 26  0 - 55 U/L   Total Protein 6.8  6.4 - 8.3 g/dL   Albumin 4.1  3.5 - 5.0 g/dL   Calcium 9.4  8.4 - 10.4 mg/dL   Anion Gap 12 (*) 3 - 11 mEq/L  CBC WITH DIFFERENTIAL      Result Value Ref Range   WBC 51.9 (*) 3.9 - 10.3 10e3/uL   NEUT# 6.0  1.5 - 6.5 10e3/uL   HGB 11.7  11.6 - 15.9 g/dL   HCT 35.6  34.8 - 46.6 %   Platelets 146  145 - 400 10e3/uL   MCV 96.6  79.5 - 101.0 fL   MCH 31.7  25.1 - 34.0 pg   MCHC 32.8  31.5 - 36.0 g/dL   RBC 3.69 (*) 3.70 - 5.45 10e6/uL   RDW 14.1  11.2 - 14.5 %   lymph# 44.6 (*) 0.9 - 3.3 10e3/uL   MONO# 1.0 (*) 0.1 - 0.9 10e3/uL   Eosinophils Absolute 0.2  0.0 - 0.5 10e3/uL   Basophils Absolute 0.1  0.0 - 0.1 10e3/uL   NEUT% 11.6 (*) 38.4 - 76.8 %   LYMPH% 85.8 (*) 14.0 - 49.7 %   MONO% 1.9  0.0 - 14.0 %   EOS% 0.4  0.0 - 7.0 %   BASO% 0.3  0.0 - 2.0 %  LACTATE DEHYDROGENASE (CC13)      Result Value Ref Range   LDH 201  125 - 245 U/L  URINALYSIS, MICROSCOPIC - [facility]      Result Value Ref Range   Glucose Negative  Negative mg/dL   Bilirubin (Urine) Negative  Negative   Ketones Negative  Negative mg/dL   Specific Gravity, Urine 1.005  1.003 - 1.035   Blood Negative  Negative   pH 6.0  4.6 - 8.0   Protein Negative  Negative- <30 mg/dL   Urobilinogen, UR 0.2  0.2 - 1 mg/dL   Nitrite Negative  Negative   Leukocyte Esterase Small  Negative    RBC / HPF Negative  0 - 2   WBC, UA 0-2  0 - 2   Bacteria, UA Trace  Negative- Trace    Urine culture sent and pending  CBC reviewed with patient, essentially stable from June.  Studies/Results: CT ABDOMEN AND PELVIS WITH CONTRAST  06-04-2014  COMPARISON: 09/10/2013  FINDINGS:  BODY WALL: Unremarkable.  LOWER CHEST: There is a lower right periesophageal lymph node which  is larger than previous, now 9 mm in short axis.  The finding is  indeterminate as the neighboring esophagus does not appear  thickened. Mild atelectasis in the lower lungs.  ABDOMEN/PELVIS:  Liver: 11 mm benign appearing lesion in the right hepatic lobe which  is stable.  Biliary: Cholecystectomy.  Pancreas: Unremarkable.  Spleen: Unremarkable.  Adrenals: Unremarkable.  Kidneys and ureters: Punctate stone in the lower pole right kidney.  Bladder: Unremarkable.  Reproductive: Hysterectomy.  Bowel: The short appendix, projecting inferior from the cecum, is  dilated at 13 mm. The lumen contains high-density material and the  appendix shows mild surrounding inflammatory change. There is  minimal peritoneal fluid in the pelvis and right lower quadrant  which is considered reactive. No bowel obstruction. No abscess or  other evidence of perforation.  Retroperitoneum: Enlarged bilateral inguinal and right iliac nodes  are stable over multiple years and considered benign.  Peritoneum: No pneumoperitoneum  Vascular: No acute abnormality.  OSSEOUS: Degenerative disc disease which is focally advanced at L2-3  and L3-4.  These results were called by telephone at the time of interpretation  on 06/04/2014 at 2:02 AM to Dr. Dina Rich, who verbally acknowledged  these results.  IMPRESSION:  1. Acute appendicitis, non-perforated.  2. Tiny right renal calculus.    Medications: I have reviewed the patient's current medications. She has had teaching for IVIG by RN today.  DISCUSSION: I have talked with patient about  prophylactic low dose IVIG, as Drs Beryle Beams and Alvy Bimler had also considered at their visits. I have told her that this may not prevent all infections, but can be helpful particularly with low IgG and recurrent infections in CLL. She is in agreement with beginning this, which will begin later this month due to out of town trip and to allow prior authorization with insurance. She understands that I will let Dr Melford Aase know plans by this note and that I am certainly glad to discuss with him if needed. Although she prefers late afternoon appointments, due to monitoring and administration time for IVIG, she understands that treatment will need to begin earlier in day. She will notify [facility] that she has nipple symptoms as above, as this may need more evaluation. If imaging ok, she is known to Dr Allyson Sabal. We will follow up urine culture, however plain UA does not look suspicious enough to begin antibiotics immediately  Assessment/Plan:  1.CLL with hypogammaglobulinemia: initial diagnosis 11-2008, no treatment to date. Marked leukocytosis in 03-2014 improved back to more usual range for her, with no worsening of other counts. Quantitative immune globulins all low, plan IVIG monthly beginning 10-01-14. if insurance allows and if she tolerates. Cytogenetics suggest 13q-. I will see her back 2-3 weeks after first IVIG, then will try to coordinate visits as possible.  2.recurrent sinusitis and bronchitis, previous hospitalization for pneumonia I believe in 2012. 3.hepatitis A  4.HTN, elevated cholesterol  5.GERD  6.obstructive sleep apnea on CPAP 7.flu vaccine done 8.dysuria: follow up urine culture pending 9.obesity, BMI 36  All questions answered and patient is in agreement with plans above. Consent obtained, prior auth requested, orders entered.   LIVESAY,LENNIS P, MD   09/15/2014, 5:21 PM

## 2014-09-16 LAB — URINE CULTURE

## 2014-09-17 ENCOUNTER — Telehealth: Payer: Self-pay

## 2014-09-17 NOTE — Telephone Encounter (Signed)
Spoke with Rebecca Bond regarding IVIG authorization and scheduling as noted below by Dr. Marko Plume. Told her the results of the urine culture as noted below  by Dr. Marko Plume.  Rebecca Bond verbalized understanding.  She will call ~09-25-14 if she has not heard from the [facility] schedulers regarding appointments.

## 2014-09-17 NOTE — Telephone Encounter (Signed)
Message copied by Baruch Merl on Wed Sep 17, 2014  8:57 AM ------      Message from: Gordy Levan      Created: Tue Sep 16, 2014  2:39 PM       Need to let patient know that recommended treatment time is ~ 4 hours, so will not be able to start the IVIG very late afternoon.            I have asked E.Angela Adam to let us know about insurance coverage prior to start, and need to let patient know if any concerns about this.               I have requested 10-28 start date, to give [place] time to get it all set up correctly, instead of next week as patient and I discussed (she prefers Wed or Thurs afternoon, will just be getting back to town next Wed and I am not in office Thurs).            thanks ------

## 2014-09-17 NOTE — Telephone Encounter (Signed)
Labs seen and need follow up: please let her know no urine infection. (she said best to call work # to get in touch with her)

## 2014-09-22 ENCOUNTER — Telehealth: Payer: Self-pay | Admitting: *Deleted

## 2014-09-22 ENCOUNTER — Telehealth: Payer: Self-pay | Admitting: Oncology

## 2014-09-22 NOTE — Telephone Encounter (Signed)
Per staff message and POF I have scheduled appts. Advised scheduler of appts. JMW  

## 2014-09-22 NOTE — Telephone Encounter (Signed)
LM to confirm appt for 10/01/14 & 10/20/14.

## 2014-09-30 ENCOUNTER — Ambulatory Visit (INDEPENDENT_AMBULATORY_CARE_PROVIDER_SITE_OTHER): Payer: 59 | Admitting: Internal Medicine

## 2014-09-30 ENCOUNTER — Telehealth: Payer: Self-pay | Admitting: *Deleted

## 2014-09-30 ENCOUNTER — Encounter: Payer: Self-pay | Admitting: Internal Medicine

## 2014-09-30 ENCOUNTER — Encounter: Payer: Self-pay | Admitting: Physician Assistant

## 2014-09-30 VITALS — BP 184/96 | HR 78 | Temp 97.8°F | Resp 18 | Ht 62.5 in | Wt 205.0 lb

## 2014-09-30 DIAGNOSIS — R7989 Other specified abnormal findings of blood chemistry: Secondary | ICD-10-CM

## 2014-09-30 DIAGNOSIS — E782 Mixed hyperlipidemia: Secondary | ICD-10-CM

## 2014-09-30 DIAGNOSIS — Z0001 Encounter for general adult medical examination with abnormal findings: Secondary | ICD-10-CM

## 2014-09-30 DIAGNOSIS — R6889 Other general symptoms and signs: Secondary | ICD-10-CM

## 2014-09-30 DIAGNOSIS — Z113 Encounter for screening for infections with a predominantly sexual mode of transmission: Secondary | ICD-10-CM

## 2014-09-30 DIAGNOSIS — Z1212 Encounter for screening for malignant neoplasm of rectum: Secondary | ICD-10-CM

## 2014-09-30 DIAGNOSIS — I10 Essential (primary) hypertension: Secondary | ICD-10-CM

## 2014-09-30 DIAGNOSIS — E559 Vitamin D deficiency, unspecified: Secondary | ICD-10-CM

## 2014-09-30 DIAGNOSIS — R945 Abnormal results of liver function studies: Secondary | ICD-10-CM

## 2014-09-30 DIAGNOSIS — R7303 Prediabetes: Secondary | ICD-10-CM

## 2014-09-30 NOTE — Progress Notes (Signed)
Patient ID: Rebecca Bond, female   DOB: 02-19-1946, 68 y.o.   MRN: [id number]  Annual Preventative & Screening Comprehensive Examination  This very nice 68 y.o.MWF presents for complete physical.  Patient has been followed for HTN, CLL  Prediabetes, Hyperlipidemia, and Vitamin D Deficiency. Patient was dx'd with CLL in Dec 2009 and been monitored expectantly by Dr Beryle Beams and morerecently by Dr Marko Plume. Patient is scheduled for Gamma Globlin infusion tomorrow per Dr Marko Plume.    HTN predates since 1998. Patient's BP had been controlled at home and patient denies any cardiac symptoms as chest pain, palpitations, shortness of breath, dizziness or ankle swelling. Today's BP: 184/96 mmHg which patient attributes to anxiety over tomorrow's planned infusion. BP was rechecked at 160/85. Patient did have a false (+) Cardiolite with a Neg Ht Cath in 2003.    Patient's hyperlipidemia is controlled with diet and Zetia. Patient denies myalgias or other medication SE's. Last lipids in May 2014 were TC 169, TG 76, HDL 58 and LDL 96 - all at goal.    Patient has Morbid Obesity with BMI 36.87 and consequent prediabetes predating since  June 2012 with A1c 6.0% and patient denies reactive hypoglycemic symptoms, visual blurring, diabetic polys, or paresthesias. Last A1c was 5.4% on  04/16/2014.   Finally, patient has history of Vitamin D Deficiency (D = 30 in 2008) and last Vitamin D was very low @ 35 in May 2014 and patiemt was advised to restart and increase her dose.   Medication Sig  . ALPRAZolam (XANAX) 1 MG tablet TAKE 1/2 TO 1 TABLET BY MOUTH 3 TIMES A DAY AS NEEDED FOR ANXIETY  . Ascorbic Acid (VITAMIN C) 1000 MG tablet Take 1,000 mg by mouth daily.  [name] Kitchen buPROPion (WELLBUTRIN XL) 300 MG 24 hr tablet TAKE 1 TABLET BY MOUTH DAILY FOR MOOD AS DIRECTED  . Calcium Carbonate-Vitamin D (CALCIUM + D PO) Take 2 tablets by mouth daily. Calcium = 500mg  / tab and 2000 units of vitamin D  . Cholecalciferol (VITAMIN D  PO) Take 1,000 Units by mouth 3 (three) times daily.  [name] Kitchen ezetimibe (ZETIA) 10 MG tablet Take 1 tablet (10 mg total) by mouth at bedtime.  . fexofenadine (ALLEGRA) 180 MG tablet Take 180 mg by mouth daily.  . furosemide (LASIX) 40 MG tablet Take 40 mg by mouth 2 (two) times daily as needed. For fluid and swelling.  Takes only as needed.  . hydrochlorothiazide (HYDRODIURIL) 12.5 MG tablet Take 1 tablet (12.5 mg total) by mouth daily.  . Nebivolol HCl (BYSTOLIC) 20 MG TABS Take 1.5 tablets (30 mg total) by mouth daily.  [name] Kitchen OVER THE COUNTER MEDICATION Nasocort daily as needed   Allergies  Allergen Reactions  . Hyzaar [Losartan Potassium-Hctz] Other (See Comments)    "messed up my sodium counts" swells up lips.  [name] [Pseudoephedrine Hcl] Palpitations  . Biaxin [Clarithromycin]     GI Upset  . Celexa [Citalopram]   . Flexeril [Cyclobenzaprine]     "zombie-like" feeling  . Fosamax [Alendronate Sodium]     GI upset  . Iohexol Hives     Code: HIVES, Desc: pt states she broke out in hive 20 yrs ago from IV contrast.     . Losartan     Angioedema  . Meloxicam     GI upset  . Norvasc [Amlodipine] Swelling  . Pseudoephedrine     Palpitations  . Zoloft [Sertraline Hcl]    Past Medical History  Diagnosis Date  . Benign  essential HTN 11/22/2011  . CLL (chronic lymphocytic leukemia)   . Pneumonia 01/2010  . History of bronchitis 08/02/2012    "maybe once/yr if that much; last time 01/2010"  . Recurrent sinus infections 08/02/2012  . 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 High cholesterol   . 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 Prediabetes   . Anxiety   . Depression   . GERD (gastroesophageal reflux disease)   . OSA (obstructive sleep apnea)    Health Maintenance  Topic Date Due  . Colonoscopy  10/15/1996  . Zostavax  10/15/2006  . Pneumococcal Polysaccharide Vaccine Age 29 And Over  10/16/2011  . Influenza Vaccine  07/06/2015  . Mammogram  08/25/2015   . Tetanus/tdap  07/05/2016   Immunization History  Administered Date(s) Administered  . Influenza,inj,Quad PF,36+ Mos 09/15/2014  . Influenza-Unspecified 08/31/2013  . Pneumococcal-Unspecified 12/28/2010  . Td 07/05/2006   Past Surgical History  Procedure Laterality Date  . Cholecystectomy  1985  . Tonsillectomy and adenoidectomy  1950's  . Breast surgery    . Lymph node biopsy      "determined I had CLL"  . Temporomandibular joint arthroplasty  1980's  . Functional endoscopic sinus surgery  1990's    "cause I kept having sinus infections"  . Abdominal hysterectomy  1980's    "endometrosis"  . Laparoscopic appendectomy N/A 06/04/2014    Procedure: APPENDECTOMY LAPAROSCOPIC;  Surgeon: Zenovia Jarred, MD;  Location: [facility] OR;  Service: General;  Laterality: N/A;   Family History  Problem Relation Age of Onset  . Parkinson's disease Mother   . Hypertension Mother   . Hypertension Maternal Grandmother   . Heart attack Maternal Grandmother     Mild   History  Substance Use Topics  . Smoking status: Former Smoker -- 0.75 packs/day for 4 years    Types: Cigarettes    Quit date: 12/05/1968  . Smokeless tobacco: Never Used  . Alcohol Use: 8.4 oz/week    14 Glasses of wine per week     Comment: 08/02/2012 "couple glasses wine q hs; last time 2-3 wk ago"    ROS Constitutional: Denies fever, chills, weight loss/gain, headaches, insomnia, fatigue, night sweats, and change in appetite. Eyes: Denies redness, blurred vision, diplopia, discharge, itchy, watery eyes.  ENT: Denies discharge, congestion, post nasal drip, epistaxis, sore throat, earache, hearing loss, dental pain, Tinnitus, Vertigo, Sinus pain, snoring.  Cardio: Denies chest pain, palpitations, irregular heartbeat, syncope, dyspnea, diaphoresis, orthopnea, PND, claudication, edema Respiratory: denies cough, dyspnea, DOE, pleurisy, hoarseness, laryngitis, wheezing.  Gastrointestinal: Denies dysphagia, heartburn, reflux, water  brash, pain, cramps, nausea, vomiting, bloating, diarrhea, constipation, hematemesis, melena, hematochezia, jaundice, hemorrhoids Genitourinary: Denies dysuria, frequency, urgency, nocturia, hesitancy, discharge, hematuria, flank pain Breast: Breast lumps, nipple discharge, bleeding.  Musculoskeletal: Denies arthralgia, myalgia, stiffness, Jt. Swelling, pain, limp, and strain/sprain. Denies falls. Skin: Denies puritis, rash, hives, warts, acne, eczema, changing in skin lesion Neuro: No weakness, tremor, incoordination, spasms, paresthesia, pain Psychiatric: Denies confusion, memory loss, sensory loss. Denies Depression. Endocrine: Denies change in weight, skin, hair change, nocturia, and paresthesia, diabetic polys, visual blurring, hyper / hypo glycemic episodes.  Heme/Lymph: No excessive bleeding, bruising, enlarged lymph nodes.  Physical Exam  BP 184/96  Pulse 78  Temp 97.8 F   Resp 18  Ht 5' 2.5"   Wt 205 lb   BMI 36.87  BP rechecked at 160/85  General Appearance: Well nourished and in no apparent distress. Eyes: PERRLA, EOMs, conjunctiva no swelling  or erythema, normal fundi and vessels. Sinuses: No frontal/maxillary tenderness ENT/Mouth: EACs patent / TMs  nl. Nares clear without erythema, swelling, mucoid exudates. Oral hygiene is good. No erythema, swelling, or exudate. Tongue normal, non-obstructing. Tonsils not swollen or erythematous. Hearing normal.  Neck: Supple, thyroid normal. No bruits, nodes or JVD. Respiratory: Respiratory effort normal.  BS equal and clear bilateral without rales, rhonci, wheezing or stridor. Cardio: Heart sounds are normal with regular rate and rhythm and no murmurs, rubs or gallops. Peripheral pulses are normal and equal bilaterally without edema. No aortic or femoral bruits. Chest: symmetric with normal excursions and percussion. Breasts: Symmetric, without lumps, nipple discharge, retractions, or fibrocystic changes.  Abdomen: Flat, soft, with  bowl sounds. Nontender, no guarding, rebound, hernias, masses, or organomegaly.  Lymphatics: Non tender without lymphadenopathy.  Genitourinary:  Musculoskeletal: Full ROM all peripheral extremities, joint stability, 5/5 strength, and normal gait. Skin: Warm and dry without rashes, lesions, cyanosis, clubbing or  ecchymosis.  Neuro: Cranial nerves intact, reflexes equal bilaterally. Normal muscle tone, no cerebellar symptoms. Sensation intact.  Pysch: Awake and oriented X 3, normal affect, Insight and Judgment appropriate.  Assessment and Plan  1. Annual Preventative & Screening Examination 2. Hypertension,Labile 3. Hyperlipidemia 4. Pre Diabetes 5. Vitamin D Deficiency 6. CLL 7. Morbid Obesity   Continue prudent diet as discussed, weight control, BP monitoring, regular exercise, and medications. Discussed med's effects and SE's. Screening labs and tests as requested with regular follow-up as recommended. Patient was advised taking extra Bystolic up to  40 mg /d to control her BP.

## 2014-09-30 NOTE — Patient Instructions (Signed)
Recommend the book "The END of DIETING" by Dr Baker Janus   and the book "The END of DIABETES " by Dr Excell Seltzer  At [email or web address] - get book & Audio CD's      Being diabetic has a  300% increased risk for heart attack, stroke, cancer, and alzheimer- type vascular dementia. It is very important that you work harder with diet by avoiding all foods that are white except chicken & fish. Avoid white rice (brown & wild rice is OK), white potatoes (sweetpotatoes in moderation is OK), White bread or wheat bread or anything made out of white flour like bagels, donuts, rolls, buns, biscuits, cakes, pastries, cookies, pizza crust, and pasta (made from white flour & egg whites) - vegetarian pasta or spinach or wheat pasta is OK. Multigrain breads like Arnold's or [employer], or multigrain sandwich thins or flatbreads.  Diet, exercise and weight loss can reverse and cure diabetes in the early stages.  Diet, exercise and weight loss is very important in the control and prevention of complications of diabetes which affects every system in your body, ie. Brain - dementia/stroke, eyes - glaucoma/blindness, heart - heart attack/heart failure, kidneys - dialysis, stomach - gastric paralysis, intestines - malabsorption, nerves - severe painful neuritis, circulation - gangrene & loss of a leg(s), and finally cancer and Alzheimers.    I recommend avoid fried & greasy foods,  sweets/candy, white rice (brown or wild rice or Quinoa is OK), white potatoes (sweet potatoes are OK) - anything made from white flour - bagels, doughnuts, rolls, buns, biscuits,white and wheat breads, pizza crust and traditional pasta made of white flour & egg white(vegetarian pasta or spinach or wheat pasta is OK).  Multi-grain bread is OK - like multi-grain flat bread or sandwich thins. Avoid alcohol in excess. Exercise is also important.    Eat all the vegetables you want - avoid meat, especially red meat and dairy - especially cheese.  Cheese  is the most concentrated form of trans-fats which is the worst thing to clog up our arteries. Veggie cheese is OK which can be found in the fresh produce section at [employer] or [employer] or [employer]   Preventive Care for Adults  A healthy lifestyle and preventive care can promote health and wellness. Preventive health guidelines for women include the following key practices.  A routine yearly physical is a good way to check with your health care provider about your health and preventive screening. It is a chance to share any concerns and updates on your health and to receive a thorough exam.  Visit your dentist for a routine exam and preventive care every 6 months. Brush your teeth twice a day and floss once a day. Good oral hygiene prevents tooth decay and gum disease.  The frequency of eye exams is based on your age, health, family medical history, use of contact lenses, and other factors. Follow your health care provider's recommendations for frequency of eye exams.  Eat a healthy diet. Foods like vegetables, fruits, whole grains, low-fat dairy products, and lean protein foods contain the nutrients you need without too many calories. Decrease your intake of foods high in solid fats, added sugars, and salt. Eat the right amount of calories for you.Get information about a proper diet from your health care provider, if necessary.  Regular physical exercise is one of the most important things you can do for your health. Most adults should get at least 150 minutes of moderate-intensity exercise (any activity  that increases your heart rate and causes you to sweat) each week. In addition, most adults need muscle-strengthening exercises on 2 or more days a week.  Maintain a healthy weight. The body mass index (BMI) is a screening tool to identify possible weight problems. It provides an estimate of body fat based on height and weight. Your health care provider can find  your BMI and can help you  achieve or maintain a healthy weight.For adults 20 years and older:    A BMI of 18.5 to 24.9 is normal.  A BMI of 25 to 29.9 is considered overweight.  A BMI of 30 and above is considered obese.    Maintain normal blood lipids and cholesterol levels by exercising and minimizing your intake of saturated fat. Eat a balanced diet with plenty of fruit and vegetables. If your lipid or cholesterol levels are high, you are over 50, or you are at high risk for heart disease, you may need your cholesterol levels checked more frequently.Ongoing high lipid and cholesterol levels should be treated with medicines if diet and exercise are not working.    Lung cancer screening is recommended for adults aged 17-80 years who are at high risk for developing lung cancer because of a history of smoking. A yearly low-dose CT scan of the lungs is recommended for people who have at least a 30-pack-year history of smoking and are a current smoker or have quit within the past 15 years. A pack year of smoking is smoking an average of 1 pack of cigarettes a day for 1 year (for example: 1 pack a day for 30 years or 2 packs a day for 15 years). Yearly screening should continue until the smoker has stopped smoking for at least 15 years. Yearly screening should be stopped for people who develop a health problem that would prevent them from having lung cancer treatment.    High blood pressure causes heart disease and increases the risk of stroke.  Ongoing high blood pressure should be treated with medicines if weight loss and exercise do not work.  If you are 75-81 years old, ask your health care provider if you should take aspirin to prevent strokes.    Diabetes screening involves taking a blood sample to check your fasting blood sugar level. This should be done once every 3 years, after age 35, if you are within normal weight and without risk factors for diabetes. Testing should be considered at a younger age or be  carried out more frequently if you are overweight and have at least 1 risk factor for diabetes.    Breast cancer screening is essential preventive care for women. You should practice "breast self-awareness." This means understanding the normal appearance and feel of your breasts and may include breast self-examination. Any changes detected, no matter how small, should be reported to a health care provider. Women in their 19s and 30s should have a clinical breast exam (CBE) by a health care provider as part of a regular health exam every 1 to 3 years. After age 55, women should have a CBE every year. Starting at age 107, women should consider having a mammogram (breast X-ray test) every year. Women who have a family history of breast cancer should talk to their health care provider about genetic screening. Women at a high risk of breast cancer should talk to their health care providers about having an MRI and a mammogram every year.  Breast cancer gene (BRCA)-related cancer risk assessment is recommended  for women who have family members with BRCA-related cancers. BRCA-related cancers include breast, ovarian, tubal, and peritoneal cancers. Having family members with these cancers may be associated with an increased risk for harmful changes (mutations) in the breast cancer genes BRCA1 and BRCA2. Results of the assessment will determine the need for genetic counseling and BRCA1 and BRCA2 testing.  Routine pelvic exams to screen for cancer are no longer recommended for nonpregnant women who are considered low risk for cancer of the pelvic organs (ovaries, uterus, and vagina) and who do not have symptoms. Ask your health care provider if a screening pelvic exam is right for you.  If you have had past treatment for cervical cancer or a condition that could lead to cancer, you need Pap tests and screening for cancer for at least 20 years after your treatment. If Pap tests have been discontinued, your risk factors  (such as having a new sexual partner) need to be reassessed to determine if screening should be resumed. Some women have medical problems that increase the chance of getting cervical cancer. In these cases, your health care provider may recommend more frequent screening and Pap tests.    Colorectal cancer can be detected and often prevented. Most routine colorectal cancer screening begins at the age of 7 years and continues through age 43 years. However, your health care provider may recommend screening at an earlier age if you have risk factors for colon cancer. On a yearly basis, your health care provider may provide home test kits to check for hidden blood in the stool. Use of a small camera at the end of a tube, to directly examine the colon (sigmoidoscopy or colonoscopy), can detect the earliest forms of colorectal cancer. Talk to your health care provider about this at age 50, when routine screening begins. Direct exam of the colon should be repeated every 5-10 years through age 54 years, unless early forms of pre-cancerous polyps or small growths are found.    Osteoporosis is a disease in which the bones lose minerals and strength with aging. This can result in serious bone fractures or breaks. The risk of osteoporosis can be identified using a bone density scan. Women ages 3 years and over and women at risk for fractures or osteoporosis should discuss screening with their health care providers. Ask your health care provider whether you should take a calcium supplement or vitamin D to reduce the rate of osteoporosis.    Menopause can be associated with physical symptoms and risks. Hormone replacement therapy is available to decrease symptoms and risks. You should talk to your health care provider about whether hormone replacement therapy is right for you.    Use sunscreen. Apply sunscreen liberally and repeatedly throughout the day. You should seek shade when your shadow is shorter than  you. Protect yourself by wearing long sleeves, pants, a wide-brimmed hat, and sunglasses year round, whenever you are outdoors.    Once a month, do a whole body skin exam, using a mirror to look at the skin on your back. Tell your health care provider of new moles, moles that have irregular borders, moles that are larger than a pencil eraser, or moles that have changed in shape or color.    Stay current with required vaccines (immunizations).  Influenza vaccine. All adults should be immunized every year.  Tetanus, diphtheria, and acellular pertussis (Td, Tdap) vaccine.    An adult who has not previously received Tdap or who does not know her vaccine status  should receive 1 dose of Tdap. This initial dose should be followed by tetanus and diphtheria toxoids (Td) booster doses every 10 years. Adults with an unknown or incomplete history of completing a 3-dose immunization series with Td-containing vaccines should begin or complete a primary immunization series including a Tdap dose. Adults should receive a Td booster every 10 years.    Zoster vaccine. One dose is recommended for adults aged 72 years or older unless certain conditions are present.    Pneumococcal 13-valent conjugate (PCV13) vaccine. (PREVNAR)  When indicated, a person who is uncertain of her immunization history and has no record of immunization should receive the PCV13 vaccine. An adult aged 48 years or older who has certain medical conditions and has not been previously immunized should receive 1 dose of PCV13 vaccine. This PCV13 should be followed with a dose of pneumococcal polysaccharide (PPSV23) vaccine. The PPSV23 vaccine dose should be obtained at least 8 weeks after the dose of PCV13 vaccine. An adult aged 33 years or older who has certain medical conditions and previously received 1 or more doses of PPSV23 vaccine should receive 1 dose of PCV13. The PCV13 vaccine dose should be obtained 1 or more years after the last  PPSV23 vaccine dose.    Pneumococcal polysaccharide (PPSV23) vaccine. (PNEUMOVAX) When PCV13 is also indicated, PCV13 should be obtained first. All adults aged 23 years and older should be immunized. An adult younger than age 72 years who has certain medical conditions should be immunized. Any person who resides in a nursing home or long-term care facility should be immunized. An adult smoker should be immunized. People with an immunocompromised condition and certain other conditions should receive both PCV13 and PPSV23 vaccines. People with human immunodeficiency virus (HIV) infection should be immunized as soon as possible after diagnosis. Immunization during chemotherapy or radiation therapy should be avoided. Routine use of PPSV23 vaccine is not recommended for American Indians, [place] Natives, or people younger than 65 years unless there are medical conditions that require PPSV23 vaccine. When indicated, people who have unknown immunization and have no record of immunization should receive PPSV23 vaccine. One-time revaccination 5 years after the first dose of PPSV23 is recommended for people aged 19-64 years who have chronic kidney failure, nephrotic syndrome, asplenia, or immunocompromised conditions. People who received 1-2 doses of PPSV23 before age 66 years should receive another dose of PPSV23 vaccine at age 33 years or later if at least 5 years have passed since the previous dose. Doses of PPSV23 are not needed for people immunized with PPSV23 at or after age 28 years.   Preventive Services / Frequency  Ages 3 years and over  Blood pressure check.  Lipid and cholesterol check.  Lung cancer screening. / Every year if you are aged 69-80 years and have a 30-pack-year history of smoking and currently smoke or have quit within the past 15 years. Yearly screening is stopped once you have quit smoking for at least 15 years or develop a health problem that would prevent you from having lung cancer  treatment.  Clinical breast exam.** / Every year after age 59 years.  BRCA-related cancer risk assessment.** / For women who have family members with a BRCA-related cancer (breast, ovarian, tubal, or peritoneal cancers).  Mammogram.** / Every year beginning at age 28 years and continuing for as long as you are in good health. Consult with your health care provider.  Pap test.** / Every 3 years starting at age 61 years through age 56  or 70 years with 3 consecutive normal Pap tests. Testing can be stopped between 65 and 70 years with 3 consecutive normal Pap tests and no abnormal Pap or HPV tests in the past 10 years.  Fecal occult blood test (FOBT) of stool. / Every year beginning at age 53 years and continuing until age 22 years. You may not need to do this test if you get a colonoscopy every 10 years.  Flexible sigmoidoscopy or colonoscopy.** / Every 5 years for a flexible sigmoidoscopy or every 10 years for a colonoscopy beginning at age 16 years and continuing until age 45 years.  Hepatitis C blood test.** / For all people born from [birth year] through 1965 and any individual with known risks for hepatitis C.  Osteoporosis screening.** / A one-time screening for women ages 20 years and over and women at risk for fractures or osteoporosis.  Skin self-exam. / Monthly.  Influenza vaccine. / Every year.  Tetanus, diphtheria, and acellular pertussis (Tdap/Td) vaccine.** / 1 dose of Td every 10 years.  Zoster vaccine.** / 1 dose for adults aged 61 years or older.  Pneumococcal 13-valent conjugate (PCV13) vaccine.** / Consult your health care provider.  Pneumococcal polysaccharide (PPSV23) vaccine.** / 1 dose for all adults aged 5 years and older.

## 2014-09-30 NOTE — Telephone Encounter (Signed)
Pt left VM stating that she spoke with her insurance company and they have yet to receive any information from [facility] in regards to her IVIG scheduled for tomorrow. Brought patient information to [employer]. She states she will work on it. Called pt back and let her know that Rebecca Bond was working on it and that I would call her back this afternoon to let her know the status.

## 2014-10-01 ENCOUNTER — Ambulatory Visit: Payer: 59

## 2014-10-01 ENCOUNTER — Telehealth: Payer: Self-pay | Admitting: Oncology

## 2014-10-01 LAB — LIPID PANEL
CHOL/HDL RATIO: 2.9 ratio
CHOLESTEROL: 164 mg/dL (ref 0–200)
HDL: 57 mg/dL (ref >39)
LDL Cholesterol: 81 mg/dL (ref 0–99)
Triglycerides: 129 mg/dL (ref <150)
VLDL: 26 mg/dL (ref 0–40)

## 2014-10-01 LAB — MICROALBUMIN / CREATININE URINE RATIO
CREATININE, URINE: 21.2 mg/dL
MICROALB UR: 1.9 mg/dL (ref <2.0)
MICROALB/CREAT RATIO: 89.6 mg/g — AB (ref 0.0–30.0)

## 2014-10-01 LAB — VITAMIN B12: Vitamin B-12: 432 pg/mL (ref 211–911)

## 2014-10-01 LAB — RPR

## 2014-10-01 LAB — HEMOGLOBIN A1C
HEMOGLOBIN A1C: 5.9 % — AB (ref <5.7)
Mean Plasma Glucose: 123 mg/dL — ABNORMAL HIGH (ref <117)

## 2014-10-01 LAB — TSH: TSH: 4.924 u[IU]/mL — ABNORMAL HIGH (ref 0.350–4.500)

## 2014-10-01 LAB — HEPATITIS A ANTIBODY, TOTAL: HEP A TOTAL AB: REACTIVE — AB

## 2014-10-01 LAB — HIV ANTIBODY (ROUTINE TESTING W REFLEX): HIV 1&2 Ab, 4th Generation: NONREACTIVE

## 2014-10-01 LAB — URIC ACID: Uric Acid, Serum: 6.2 mg/dL (ref 2.4–7.0)

## 2014-10-01 LAB — HEPATITIS C ANTIBODY: HCV Ab: NEGATIVE

## 2014-10-01 LAB — HEPATITIS B CORE ANTIBODY, TOTAL: Hep B Core Total Ab: NONREACTIVE

## 2014-10-01 LAB — INSULIN, FASTING: Insulin fasting, serum: 10.5 u[IU]/mL (ref 2.0–19.6)

## 2014-10-01 LAB — MAGNESIUM: Magnesium: 1.6 mg/dL (ref 1.5–2.5)

## 2014-10-01 LAB — VITAMIN D 25 HYDROXY (VIT D DEFICIENCY, FRACTURES): Vit D, 25-Hydroxy: 43 ng/mL (ref 30–89)

## 2014-10-01 LAB — HEPATITIS B SURFACE ANTIBODY,QUALITATIVE: HEP B S AB: NEGATIVE

## 2014-10-01 NOTE — Patient Instructions (Signed)
[facility] Discharge Instructions for Patients Receiving Chemotherapy  Today you received the following chemotherapy agents IVIG  To help prevent nausea and vomiting after your treatment, we encourage you to take your nausea medication   If you develop nausea and vomiting that is not controlled by your nausea medication, call the clinic.   BELOW ARE SYMPTOMS THAT SHOULD BE REPORTED IMMEDIATELY:  *FEVER GREATER THAN 100.5 F  *CHILLS WITH OR WITHOUT FEVER  NAUSEA AND VOMITING THAT IS NOT CONTROLLED WITH YOUR NAUSEA MEDICATION  *UNUSUAL SHORTNESS OF BREATH  *UNUSUAL BRUISING OR BLEEDING  TENDERNESS IN MOUTH AND THROAT WITH OR WITHOUT PRESENCE OF ULCERS  *URINARY PROBLEMS  *BOWEL PROBLEMS  UNUSUAL RASH Items with * indicate a potential emergency and should be followed up as soon as possible.  Feel free to call the clinic you have any questions or concerns. The clinic phone number is [phone].

## 2014-10-01 NOTE — Telephone Encounter (Signed)
Called pt and let her know that we have submitted information to insurance and that would kept pt posted on the status of approval but did tell her to come as scheduled. Per Dannielle Huh, she let insurance company know her appt was scheduled for 10/01/14 in the morning.

## 2014-10-01 NOTE — Telephone Encounter (Signed)
added inf for 10/30 @ 7:30am. d/t/ok to overbook per inf manager. appt r/s from 10/28. pt given appt by managed care team lead.

## 2014-10-01 NOTE — Progress Notes (Signed)
Per Dannielle Huh in managed care, IVIG is not approved yet and we may not hear about approval until later today or tomorrow.  We will delay her treatment until it has been approved per Dr Marko Plume.  Informed patient.  She requested to speak to a Freight forwarder.  Gaspar Bidding informed, she met patient in infusion and discussed the case with the patient.  Pt is aware that schedulers or Dr Mariana Kaufman RN will call.

## 2014-10-02 ENCOUNTER — Encounter: Payer: Self-pay | Admitting: Oncology

## 2014-10-02 ENCOUNTER — Telehealth: Payer: Self-pay | Admitting: Oncology

## 2014-10-02 LAB — HEPATITIS B E ANTIBODY: Hepatitis Be Antibody: NONREACTIVE

## 2014-10-02 NOTE — Telephone Encounter (Signed)
Called patient on 10/01/14 at 4:30pm to advised, her IVIG treatment is scheduled for 10/03/14 at 7:30am and the auth for IVIG is still pending with [facility]. Advised patient we will follow-up with [employer] in the a.m. to check status and will call her as soon as we receive approval.

## 2014-10-03 ENCOUNTER — Ambulatory Visit (HOSPITAL_BASED_OUTPATIENT_CLINIC_OR_DEPARTMENT_OTHER): Payer: 59

## 2014-10-03 ENCOUNTER — Other Ambulatory Visit: Payer: Self-pay

## 2014-10-03 VITALS — BP 154/53 | HR 53 | Temp 98.4°F | Resp 18

## 2014-10-03 DIAGNOSIS — C9110 Chronic lymphocytic leukemia of B-cell type not having achieved remission: Secondary | ICD-10-CM

## 2014-10-03 MED ORDER — IMMUNE GLOBULIN (HUMAN) 20 GM/200ML IV SOLN
20.0000 g | Freq: Once | INTRAVENOUS | Status: AC
  Administered 2014-10-03: 20 g via INTRAVENOUS
  Filled 2014-10-03: qty 200

## 2014-10-03 MED ORDER — DIPHENHYDRAMINE HCL 25 MG PO TABS
25.0000 mg | ORAL_TABLET | Freq: Once | ORAL | Status: AC
  Administered 2014-10-03: 25 mg via ORAL
  Filled 2014-10-03: qty 1

## 2014-10-03 MED ORDER — ACETAMINOPHEN 325 MG PO TABS
ORAL_TABLET | ORAL | Status: AC
  Filled 2014-10-03: qty 2

## 2014-10-03 MED ORDER — ACETAMINOPHEN 325 MG PO TABS
650.0000 mg | ORAL_TABLET | Freq: Once | ORAL | Status: AC
  Administered 2014-10-03: 650 mg via ORAL

## 2014-10-03 MED ORDER — SODIUM CHLORIDE 0.9 % IV SOLN
Freq: Once | INTRAVENOUS | Status: AC
  Administered 2014-10-03: 08:00:00 via INTRAVENOUS

## 2014-10-03 MED ORDER — DIPHENHYDRAMINE HCL 25 MG PO CAPS
ORAL_CAPSULE | ORAL | Status: AC
  Filled 2014-10-03: qty 1

## 2014-10-03 NOTE — Patient Instructions (Signed)

## 2014-10-13 ENCOUNTER — Other Ambulatory Visit: Payer: Self-pay | Admitting: Internal Medicine

## 2014-10-13 MED ORDER — AZITHROMYCIN 250 MG PO TABS: ORAL_TABLET | ORAL | Status: DC

## 2014-10-14 ENCOUNTER — Telehealth: Payer: Self-pay | Admitting: *Deleted

## 2014-10-14 NOTE — Telephone Encounter (Signed)
Pt left VM stating that she saw her PCP and he prescribed her Zithromax for a sinus infection. She is asking if it is okay to take that and tylenol since she had recent IVIG infusion. Per Melissa in pharmacy, there is no interaction with IVIG and Zithromax. Called and let pt know that is okay to take the Zithromax and also tylenol as needed.

## 2014-10-19 ENCOUNTER — Other Ambulatory Visit: Payer: Self-pay | Admitting: Oncology

## 2014-10-19 DIAGNOSIS — C9110 Chronic lymphocytic leukemia of B-cell type not having achieved remission: Secondary | ICD-10-CM

## 2014-10-20 ENCOUNTER — Other Ambulatory Visit (HOSPITAL_BASED_OUTPATIENT_CLINIC_OR_DEPARTMENT_OTHER): Payer: 59

## 2014-10-20 ENCOUNTER — Telehealth: Payer: Self-pay | Admitting: Oncology

## 2014-10-20 ENCOUNTER — Ambulatory Visit (HOSPITAL_BASED_OUTPATIENT_CLINIC_OR_DEPARTMENT_OTHER): Payer: 59 | Admitting: Oncology

## 2014-10-20 ENCOUNTER — Encounter: Payer: Self-pay | Admitting: Oncology

## 2014-10-20 VITALS — BP 144/80 | HR 62 | Temp 97.7°F | Resp 18 | Ht 62.5 in | Wt 207.5 lb

## 2014-10-20 DIAGNOSIS — C9110 Chronic lymphocytic leukemia of B-cell type not having achieved remission: Secondary | ICD-10-CM

## 2014-10-20 LAB — CBC WITH DIFFERENTIAL/PLATELET
BASO%: 0.6 % (ref 0.0–2.0)
Basophils Absolute: 0.4 10*3/uL — ABNORMAL HIGH (ref 0.0–0.1)
EOS%: 0.4 % (ref 0.0–7.0)
Eosinophils Absolute: 0.2 10*3/uL (ref 0.0–0.5)
HCT: 36.9 % (ref 34.8–46.6)
HGB: 11.8 g/dL (ref 11.6–15.9)
LYMPH%: 86.1 % — AB (ref 14.0–49.7)
MCH: 30.9 pg (ref 25.1–34.0)
MCHC: 32 g/dL (ref 31.5–36.0)
MCV: 96.7 fL (ref 79.5–101.0)
MONO#: 1.3 10*3/uL — ABNORMAL HIGH (ref 0.1–0.9)
MONO%: 2.3 % (ref 0.0–14.0)
NEUT#: 6 10*3/uL (ref 1.5–6.5)
NEUT%: 10.6 % — ABNORMAL LOW (ref 38.4–76.8)
Platelets: 157 10*3/uL (ref 145–400)
RBC: 3.82 10*6/uL (ref 3.70–5.45)
RDW: 13.5 % (ref 11.2–14.5)
WBC: 56.2 10*3/uL (ref 3.9–10.3)
lymph#: 48.3 10*3/uL — ABNORMAL HIGH (ref 0.9–3.3)

## 2014-10-20 LAB — COMPREHENSIVE METABOLIC PANEL (CC13)
ALBUMIN: 4.1 g/dL (ref 3.5–5.0)
ALT: 27 U/L (ref 0–55)
AST: 27 U/L (ref 5–34)
Alkaline Phosphatase: 67 U/L (ref 40–150)
Anion Gap: 12 mEq/L — ABNORMAL HIGH (ref 3–11)
BILIRUBIN TOTAL: 0.56 mg/dL (ref 0.20–1.20)
BUN: 16.2 mg/dL (ref 7.0–26.0)
CO2: 22 mEq/L (ref 22–29)
Calcium: 9.4 mg/dL (ref 8.4–10.4)
Chloride: 97 mEq/L — ABNORMAL LOW (ref 98–109)
Creatinine: 0.8 mg/dL (ref 0.6–1.1)
Glucose: 95 mg/dl (ref 70–140)
POTASSIUM: 3.7 meq/L (ref 3.5–5.1)
SODIUM: 131 meq/L — AB (ref 136–145)
Total Protein: 6.8 g/dL (ref 6.4–8.3)

## 2014-10-20 LAB — LACTATE DEHYDROGENASE (CC13): LDH: 231 U/L (ref 125–245)

## 2014-10-20 LAB — TECHNOLOGIST REVIEW

## 2014-10-20 NOTE — Progress Notes (Signed)
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 She tolerated low dose of IVIG (200 mg/kg= 20 gm) on 10-03-14 without difficulty, that treatment postponed due to prior authorization delays. I believe that she needs prior auth for each IVIG treatment (?)  Patient's husband was ill with bronchitis last week. She did develop similar symptoms, however was sick only 2 days, which was much faster resolution than usual. Husband and patient were both treated with azithromycin by Dr Melford Aase, however I do not believe she had steroids additionally. She has had no other symptoms of infection, no bleeding, no noted changes in adenopathy. She has some environmental allergy symptoms. Energy is at baseline.   No PAC Flu vaccine done  ONCOLOGIC HISTORY Patient was diagnosed with stage I CLL in 11-2008 by biopsy of left axillary lymph node found on routine mammograms. She has not required treatment for the CLL, however does have fluctuations in WBC at times related to intermittent steroids used for recurrent sinusitis and bronchitis. Cytogenetics 06-03-14 at [facility] suggests 13q- (no deletions ATM, CEP 12, 13q34 or p53). She has not had bulky adenopathy, previous anemia or thrombocytopenia.   Review of systems as above, also: Peripheral IV access ok for IVIG. No frank night sweats.  Remainder of 10 point Review of Systems negative/ unchanged..  Objective:  Vital signs in last 24 hours:  BP 144/80 mmHg  Pulse 62  Temp(Src) 97.7 F (36.5 C) (Oral)  Resp 18  Ht 5' 2.5" (1.588 m)  Wt 207 lb 8 oz (94.121 kg)  BMI 37.32 kg/m2 weight up 3 lbs  Alert, oriented and appropriate. Ambulatory without difficulty. Respirations not labored RA  HEENT:PERRL, sclerae not  icteric. Oral mucosa moist without lesions, posterior pharynx clear. Nasal turbinates minimally boggy, clear drainage. Neck supple. No JVD.  Lymphatics:no cervical,supraclavicular, or inguinal adenopathy. Left axillary node stable ~ 1.5 cm Resp: clear to auscultation bilaterally and normal percussion bilaterally Cardio: regular rate and rhythm. No gallop. GI: abdomen obese, soft, nontended, no appreciable mass or organomegaly. Normally active bowel sounds.  Musculoskeletal/ Extremities: without pitting edema, cords, tenderness Neuro: nonfocal  Psych: appropriate mood and affect Skin without rash, ecchymosis, petechiae   Lab Results:  Results for orders placed or performed in visit on 10/20/14  CBC with Differential  Result Value Ref Range   WBC 56.2 (HH) 3.9 - 10.3 10e3/uL   NEUT# 6.0 1.5 - 6.5 10e3/uL   HGB 11.8 11.6 - 15.9 g/dL   HCT 36.9 34.8 - 46.6 %   Platelets 157 145 - 400 10e3/uL   MCV 96.7 79.5 - 101.0 fL   MCH 30.9 25.1 - 34.0 pg   MCHC 32.0 31.5 - 36.0 g/dL   RBC 3.82 3.70 - 5.45 10e6/uL   RDW 13.5 11.2 - 14.5 %   lymph# 48.3 (H) 0.9 - 3.3 10e3/uL   MONO# 1.3 (H) 0.1 - 0.9 10e3/uL   Eosinophils Absolute 0.2 0.0 - 0.5 10e3/uL   Basophils Absolute 0.4 (H) 0.0 - 0.1 10e3/uL   NEUT% 10.6 (L) 38.4 - 76.8 %   LYMPH% 86.1 (H) 14.0 - 49.7 %   MONO% 2.3 0.0 - 14.0 %   EOS% 0.4 0.0 - 7.0 %   BASO% 0.6 0.0 - 2.0 %   CMET available after visit Na 131, Cl 97, otherwise normal  Studies/Results:  No results found.  Medications: I have reviewed the patient's current medications. OK for pneumovax/ Prevnar by Dr Melford Aase. She should not have shingles vaccine due to immunocompromised status.  DISCUSSION: patient is encouraged that recent IVIG may have mitigated subsequent respiratory infection; she would like to continue this at least next few months as winter has been her worst time for respiratory infections. If insurance allows, we will increase dose to 400 mg/kg = 40 gm, to be  given 11-14-14.  Assessment/Plan:  1.CLL with hypogammaglobulinemia: initial diagnosis 11-2008, no treatment to date. Marked leukocytosis in 03-2014 possibly related to other steroids,  improved back to more usual range for her, with no worsening of other counts. Quantitative immune globulins all low at baseline. Continue IVIG monthly for now if possible. 2.recurrent sinusitis and bronchitis: much shorter and less severe infection than usual shortly after first IVIG. I will see her back in follow up with second IVIG. 3.hepatitis A 4.HTN, elevated cholesterol 5.GERD 6.obstructive sleep apnea 7.flu vaccine done 8.stable mild hypoNa hypoCL likely from diuretic  Preauthorization staff notified. Orders placed, tho again need to confirm preauth before this is given. Verbal consent obtained. All questions answered.  Time spent 25 min including >50% counseling and coordination of care.   LIVESAY,LENNIS P, MD   10/20/2014, 4:00 PM

## 2014-10-20 NOTE — Telephone Encounter (Signed)
gv adn printed appt sched and avs for pt for DEC adn Jan 2016....sed added tx. °

## 2014-10-22 ENCOUNTER — Telehealth: Payer: Self-pay

## 2014-10-22 NOTE — Telephone Encounter (Signed)
LM for Rebecca Bond stating that she needs to obtain an FMLA form from her Employer and bring to Dr. Mariana Kaufman office to be completed. Stated that Dr. Marko Plume said that she did well receiving the smaller dose of IVIG so that she does not foresee she will have any problems with increased dose on 11-14-14.  If she would like to take the next day off to rest that would be fine but not necessary.

## 2014-10-27 ENCOUNTER — Telehealth: Payer: Self-pay | Admitting: *Deleted

## 2014-10-27 NOTE — Telephone Encounter (Signed)
Patient left voicemail regarding her appt for December. I have called her back and left a message to call the office back.

## 2014-10-28 ENCOUNTER — Telehealth: Payer: Self-pay | Admitting: *Deleted

## 2014-10-28 ENCOUNTER — Other Ambulatory Visit: Payer: Self-pay

## 2014-10-28 MED ORDER — BUPROPION HCL ER (XL) 300 MG PO TB24: ORAL_TABLET | ORAL | Status: DC

## 2014-10-28 NOTE — Telephone Encounter (Signed)
Pt returned call - she was inquiring about headaches with IVIG. She states her grandson gets IVIG and has horrible headaches. Told pt that she can have headaches as that is a side effect with IVIG. Asked if she had a headache with the last infusion - she said she had a slight one. Told her that since she is having an increased dose of IVIG with next infusion that she will have to wait and see about side effects as each person is different. Pt understanding of this and appreciative of the call back.

## 2014-10-28 NOTE — Telephone Encounter (Signed)
Per Barbaraann Share, RN patient called yesterday stating she would like to keep IVIG appt as scheduled 11/14/14 but would like to speak with someone. Attempted to call patient - left VM for her to call us back if she would still like to speak to someone or has any questions or concerns.

## 2014-11-05 ENCOUNTER — Other Ambulatory Visit: Payer: Self-pay

## 2014-11-05 MED ORDER — NEBIVOLOL HCL 20 MG PO TABS: 30.0000 mg | ORAL_TABLET | Freq: Every day | ORAL | Status: DC

## 2014-11-05 NOTE — Telephone Encounter (Signed)
Rx sent to pharmacy   

## 2014-11-06 ENCOUNTER — Telehealth: Payer: Self-pay

## 2014-11-06 ENCOUNTER — Encounter: Payer: Self-pay | Admitting: Oncology

## 2014-11-06 NOTE — Telephone Encounter (Signed)
Left a message for Ms. Melching stating that Dannielle Huh in our Managed Care Dept sent in a authorization form  for for increased dose of IVIG for 6 months beginning 11-14-14. Elizabeth infformed this nurse that it was approved for 6 month.

## 2014-11-06 NOTE — Progress Notes (Signed)
PER IN BASKET FROM [facility] AND DR LIVESAY ABOUT DOSE CHANGE FOR THE IVIG  STARTING ON 11/14/14. I CALLED [employer] [phone]- AND SPOKE TO [place] SUBMITTED A NEW REQUEST FOR 6 VISITS AND THE IVIG O1188 WILL BE 58 UNITS EACH VISIT.  NEW REF # [id number].  I INFORMED LOUISE AND SHE WILL LET  THE PATIENT KNOW THAT WE ARE WORKING ON IT.

## 2014-11-14 ENCOUNTER — Other Ambulatory Visit: Payer: Self-pay | Admitting: Oncology

## 2014-11-14 ENCOUNTER — Ambulatory Visit (HOSPITAL_BASED_OUTPATIENT_CLINIC_OR_DEPARTMENT_OTHER): Payer: 59

## 2014-11-14 DIAGNOSIS — C9110 Chronic lymphocytic leukemia of B-cell type not having achieved remission: Secondary | ICD-10-CM

## 2014-11-14 MED ORDER — IMMUNE GLOBULIN (HUMAN) 20 GM/200ML IV SOLN
400.0000 mg/kg | Freq: Once | INTRAVENOUS | Status: AC
  Administered 2014-11-14: 35 g via INTRAVENOUS
  Filled 2014-11-14: qty 350

## 2014-11-14 MED ORDER — DIPHENHYDRAMINE HCL 25 MG PO CAPS
25.0000 mg | ORAL_CAPSULE | Freq: Once | ORAL | Status: DC
  Administered 2014-11-14: 25 mg via ORAL

## 2014-11-14 MED ORDER — DIPHENHYDRAMINE HCL 25 MG PO CAPS
ORAL_CAPSULE | ORAL | Status: AC
  Filled 2014-11-14: qty 1

## 2014-11-14 MED ORDER — ACETAMINOPHEN 325 MG PO TABS
650.0000 mg | ORAL_TABLET | Freq: Once | ORAL | Status: DC
  Administered 2014-11-14: 650 mg via ORAL

## 2014-11-14 MED ORDER — ACETAMINOPHEN 325 MG PO TABS
ORAL_TABLET | ORAL | Status: AC
  Filled 2014-11-14: qty 2

## 2014-11-14 NOTE — Patient Instructions (Signed)

## 2014-11-17 ENCOUNTER — Telehealth: Payer: Self-pay | Admitting: *Deleted

## 2014-11-17 NOTE — Telephone Encounter (Signed)
Pt left a message stating she had treatment on Friday. Started having a headache on Saturday, Sunday was worse. Continues to have headache today and it feels like it is "pounding" when she stands up.   Unable to reach by phone to find out what she has taken for pain.

## 2014-11-17 NOTE — Telephone Encounter (Signed)
Left message for patient that it is OK to take ibuprofen for headache. Yes, IVIG can cause diarrhea- make sure to stay well hydrated. Go to ED if headache gets worse.

## 2014-11-19 ENCOUNTER — Telehealth: Payer: Self-pay

## 2014-11-19 NOTE — Telephone Encounter (Signed)
Rebecca Bond called to let Dr. Marko Plume know that the Advil helped her headache.  There diarrhea is gone.  Her stomach is still a little unsettled.  She is a lot better than she was over this past weekend and Monday.

## 2014-12-03 ENCOUNTER — Telehealth: Payer: Self-pay | Admitting: Cardiovascular Disease

## 2014-12-03 MED ORDER — NEBIVOLOL HCL 10 MG PO TABS: 30.0000 mg | ORAL_TABLET | Freq: Every day | ORAL | Status: DC

## 2014-12-03 NOTE — Telephone Encounter (Signed)
Pt called wanting to change bystolic from 20mg  dose to 10mg  since the 20mg  are hard to score for her 30mg  daily administration.  She also wanted Rx sent to new pharmacy of choice.  No other concerns.

## 2014-12-03 NOTE — Telephone Encounter (Signed)
Left message for patient to call back  

## 2014-12-03 NOTE — Telephone Encounter (Signed)
Please call,concerning her Bystolic.

## 2014-12-07 ENCOUNTER — Other Ambulatory Visit: Payer: Self-pay | Admitting: Oncology

## 2014-12-07 DIAGNOSIS — C9110 Chronic lymphocytic leukemia of B-cell type not having achieved remission: Secondary | ICD-10-CM

## 2014-12-15 ENCOUNTER — Other Ambulatory Visit (HOSPITAL_BASED_OUTPATIENT_CLINIC_OR_DEPARTMENT_OTHER): Payer: 59

## 2014-12-15 ENCOUNTER — Ambulatory Visit (HOSPITAL_BASED_OUTPATIENT_CLINIC_OR_DEPARTMENT_OTHER): Payer: 59 | Admitting: Oncology

## 2014-12-15 ENCOUNTER — Telehealth: Payer: Self-pay | Admitting: Oncology

## 2014-12-15 ENCOUNTER — Encounter: Payer: Self-pay | Admitting: Oncology

## 2014-12-15 VITALS — BP 162/67 | HR 67 | Temp 98.7°F | Resp 18 | Ht 62.5 in | Wt 209.0 lb

## 2014-12-15 DIAGNOSIS — J329 Chronic sinusitis, unspecified: Secondary | ICD-10-CM

## 2014-12-15 DIAGNOSIS — E780 Pure hypercholesterolemia: Secondary | ICD-10-CM

## 2014-12-15 DIAGNOSIS — D801 Nonfamilial hypogammaglobulinemia: Secondary | ICD-10-CM

## 2014-12-15 DIAGNOSIS — I10 Essential (primary) hypertension: Secondary | ICD-10-CM

## 2014-12-15 DIAGNOSIS — C9110 Chronic lymphocytic leukemia of B-cell type not having achieved remission: Secondary | ICD-10-CM

## 2014-12-15 DIAGNOSIS — B159 Hepatitis A without hepatic coma: Secondary | ICD-10-CM

## 2014-12-15 DIAGNOSIS — J40 Bronchitis, not specified as acute or chronic: Secondary | ICD-10-CM

## 2014-12-15 DIAGNOSIS — K219 Gastro-esophageal reflux disease without esophagitis: Secondary | ICD-10-CM

## 2014-12-15 LAB — CBC WITH DIFFERENTIAL/PLATELET
BASO%: 0.5 % (ref 0.0–2.0)
Basophils Absolute: 0.3 10*3/uL — ABNORMAL HIGH (ref 0.0–0.1)
EOS%: 0.6 % (ref 0.0–7.0)
Eosinophils Absolute: 0.3 10*3/uL (ref 0.0–0.5)
HCT: 35.5 % (ref 34.8–46.6)
HGB: 11.4 g/dL — ABNORMAL LOW (ref 11.6–15.9)
LYMPH%: 86.4 % — ABNORMAL HIGH (ref 14.0–49.7)
MCH: 31.5 pg (ref 25.1–34.0)
MCHC: 32.1 g/dL (ref 31.5–36.0)
MCV: 97.9 fL (ref 79.5–101.0)
MONO#: 1.3 10*3/uL — AB (ref 0.1–0.9)
MONO%: 2.2 % (ref 0.0–14.0)
NEUT%: 10.3 % — ABNORMAL LOW (ref 38.4–76.8)
NEUTROS ABS: 6.3 10*3/uL (ref 1.5–6.5)
Platelets: 140 10*3/uL — ABNORMAL LOW (ref 145–400)
RBC: 3.63 10*6/uL — ABNORMAL LOW (ref 3.70–5.45)
RDW: 14 % (ref 11.2–14.5)
WBC: 61 10*3/uL (ref 3.9–10.3)
lymph#: 52.7 10*3/uL — ABNORMAL HIGH (ref 0.9–3.3)

## 2014-12-15 LAB — COMPREHENSIVE METABOLIC PANEL (CC13)
ALBUMIN: 4.1 g/dL (ref 3.5–5.0)
ALT: 28 U/L (ref 0–55)
AST: 33 U/L (ref 5–34)
Alkaline Phosphatase: 73 U/L (ref 40–150)
Anion Gap: 11 mEq/L (ref 3–11)
BUN: 15.8 mg/dL (ref 7.0–26.0)
CALCIUM: 8.9 mg/dL (ref 8.4–10.4)
CHLORIDE: 96 meq/L — AB (ref 98–109)
CO2: 25 mEq/L (ref 22–29)
CREATININE: 0.8 mg/dL (ref 0.6–1.1)
EGFR: 76 mL/min/{1.73_m2} — AB (ref >90)
GLUCOSE: 93 mg/dL (ref 70–140)
Potassium: 3.8 mEq/L (ref 3.5–5.1)
Sodium: 132 mEq/L — ABNORMAL LOW (ref 136–145)
Total Bilirubin: 0.67 mg/dL (ref 0.20–1.20)
Total Protein: 6.7 g/dL (ref 6.4–8.3)

## 2014-12-15 LAB — TECHNOLOGIST REVIEW

## 2014-12-15 NOTE — Telephone Encounter (Signed)
per pof to sch pt appt-gave pt copy of sch-per LL of to sch @4 -sent MW enmail to sch pt trmt

## 2014-12-15 NOTE — Progress Notes (Signed)
OFFICE PROGRESS NOTE   12/15/2014   Physicians:J.Granfortuna, N.Gorsuch), W.McKeown, Shelva Majestic, A.Ross (gyn), F.Seth Bake  INTERVAL HISTORY:   Patient is seen, alone for visit, in continuing attention to CLL, now receiving prophylactic IVIG planned at least thru this winter. She has had no prolonged or significant infectious illness since beginning this treatment in late October.  Patient received second cycle of IVIG at increased dose on 11-14-14, tolerating the infusion without difficulty. On 11-17-14 she contacted our office from her work to report frontal HA and diarrhea. Although she was uncomfortable, she did not seem to be in any acute distress and preferred not to be seen at ED. She used advil x 1, with resolution of the HA and no further diarrhea.  Patient has otherwise felt at baseline, with no symptoms of infection. Coworkers are using more hand cleaner. Energy and appetite have been good, no bleeding.   No PAC Flu vaccine done She plans to get Prevnar vaccine from Dr Ranae Palms age 37 in [place] has hemophilia   ONCOLOGIC HISTORY Patient was diagnosed with stage I CLL in 11-2008 by biopsy of left axillary lymph node found on routine mammograms. She has not required treatment for the CLL, however does have fluctuations in WBC at times related to intermittent steroids used for recurrent sinusitis and bronchitis. Cytogenetics 06-03-14 at [facility] suggests 13q- (no deletions ATM, CEP 12, 13q34 or p53). She has not had bulky adenopathy, previous anemia or thrombocytopenia. She had had numerous infections, mostly respiratory, with low immune globulins. She began prophylactic IVIG 10-03-14, tolerated adequately at total 20 gm, then dose increased to 400 mg/kg = 35 gm on 11-14-14.    Review of systems as above, also: No new or different pain. No increased SOB or cough. No LE swelling. No increased or painful adenopathy Remainder of 10 point Review of Systems  negative.  Objective:  Vital signs in last 24 hours:  BP 162/67 mmHg  Pulse 67  Temp(Src) 98.7 F (37.1 C) (Oral)  Resp 18  Ht 5' 2.5" (1.588 m)  Wt 209 lb (94.802 kg)  BMI 37.59 kg/m2 Weight up 2 lb. Alert, oriented and appropriate, pleasant. Ambulatory without difficulty. Talkative, looks comfortable   HEENT:PERRL, sclerae not icteric. Oral mucosa moist without lesions, posterior pharynx clear.  Neck supple. No JVD.  Lymphatics:no cervical,supraclavicular or inguinal adenopathy. Left axillary adenopathy ~ 1.5 cm Resp: clear to auscultation bilaterally and normal percussion bilaterally Cardio: regular rate and rhythm. No gallop. GI: soft, nontender, not distended, no mass or organomegaly. Normally active bowel sounds.  Musculoskeletal/ Extremities: without pitting edema, cords, tenderness Neuro: no peripheral neuropathy. Otherwise nonfocal Skin without rash, ecchymosis, petechiae   Lab Results:  Results for orders placed or performed in visit on 12/15/14  TECHNOLOGIST REVIEW  Result Value Ref Range   Technologist Review      Variant lymphs present, moderate smudge cells, rare meta and myelocytes  CBC with Differential  Result Value Ref Range   WBC 61.0 (HH) 3.9 - 10.3 10e3/uL   NEUT# 6.3 1.5 - 6.5 10e3/uL   HGB 11.4 (L) 11.6 - 15.9 g/dL   HCT 35.5 34.8 - 46.6 %   Platelets 140 (L) 145 - 400 10e3/uL   MCV 97.9 79.5 - 101.0 fL   MCH 31.5 25.1 - 34.0 pg   MCHC 32.1 31.5 - 36.0 g/dL   RBC 3.63 (L) 3.70 - 5.45 10e6/uL   RDW 14.0 11.2 - 14.5 %   lymph# 52.7 (H) 0.9 - 3.3  10e3/uL   MONO# 1.3 (H) 0.1 - 0.9 10e3/uL   Eosinophils Absolute 0.3 0.0 - 0.5 10e3/uL   Basophils Absolute 0.3 (H) 0.0 - 0.1 10e3/uL   NEUT% 10.3 (L) 38.4 - 76.8 %   LYMPH% 86.4 (H) 14.0 - 49.7 %   MONO% 2.2 0.0 - 14.0 %   EOS% 0.6 0.0 - 7.0 %   BASO% 0.5 0.0 - 2.0 %  Comprehensive metabolic panel (Cmet) - [facility]  Result Value Ref Range   Sodium 132 (L) 136 - 145 mEq/L   Potassium 3.8 3.5 - 5.1  mEq/L   Chloride 96 (L) 98 - 109 mEq/L   CO2 25 22 - 29 mEq/L   Glucose 93 70 - 140 mg/dl   BUN 15.8 7.0 - 26.0 mg/dL   Creatinine 0.8 0.6 - 1.1 mg/dL   Total Bilirubin 0.67 0.20 - 1.20 mg/dL   Alkaline Phosphatase 73 40 - 150 U/L   AST 33 5 - 34 U/L   ALT 28 0 - 55 U/L   Total Protein 6.7 6.4 - 8.3 g/dL   Albumin 4.1 3.5 - 5.0 g/dL   Calcium 8.9 8.4 - 10.4 mg/dL   Anion Gap 11 3 - 11 mEq/L   EGFR 76 (L) >90 ml/min/1.73 m2     Studies/Results:  No results found.  Medications: I have reviewed the patient's current medications.  DISCUSSION: labs above reviewed. She is encouraged with treatment course to date and is in agreement with continuing.   Note per managed care, IVIG at 400 mg/kg approved by her insurance for 6 months beginning 11-14-14.  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 Marked leukocytosis in 03-2014 possibly related to other steroids, improved back to more usual range for her, with no worsening of other counts. Quantitative immune globulins all low at baseline. Continue IVIG monthly for now, at least thru winter. 2.recurrent sinusitis and bronchitis: much shorter and less severe infection than usual shortly after first IVIG.  3.hepatitis A 4.HTN, elevated cholesterol 5.GERD 6.obstructive sleep apnea 7.flu vaccine done 8.stable mild hypoNa hypoCL likely from diuretic  Cc this note to Dr Melford Aase. Orders placed for cycle 3, prefers Friday AMs. I will see her back with lab ~ 3 weeks after next treatment.   Gordy Levan, MD   12/15/2014, 4:28 PM

## 2014-12-17 ENCOUNTER — Telehealth: Payer: Self-pay | Admitting: Oncology

## 2014-12-17 NOTE — Telephone Encounter (Signed)
pt cld left vm wanting to r/s appt-cld pt back adv to call so we may have cottectappt to r/s

## 2014-12-17 NOTE — Telephone Encounter (Signed)
cld pt & adv of IVIG appt time & date

## 2014-12-18 ENCOUNTER — Telehealth: Payer: Self-pay | Admitting: *Deleted

## 2014-12-18 ENCOUNTER — Telehealth: Payer: Self-pay | Admitting: Oncology

## 2014-12-18 NOTE — Telephone Encounter (Signed)
, °

## 2014-12-18 NOTE — Telephone Encounter (Signed)
Per patient request I have moved her appt from 1/22 to 1/29

## 2014-12-21 DIAGNOSIS — D801 Nonfamilial hypogammaglobulinemia: Secondary | ICD-10-CM | POA: Insufficient documentation

## 2014-12-21 MED ORDER — DIPHENHYDRAMINE HCL 25 MG PO CAPS: 25.0000 mg | ORAL_CAPSULE | Freq: Once | ORAL | Status: DC

## 2014-12-21 MED ORDER — ACETAMINOPHEN 325 MG PO TABS: 325.0000 mg | ORAL_TABLET | Freq: Once | ORAL | Status: DC | PRN

## 2014-12-23 ENCOUNTER — Telehealth: Payer: Self-pay | Admitting: Cardiology

## 2014-12-23 MED ORDER — EZETIMIBE 10 MG PO TABS: 10.0000 mg | ORAL_TABLET | Freq: Every day | ORAL | Status: DC

## 2014-12-23 NOTE — Telephone Encounter (Signed)
°  1. Which medications need to be refilled? Zetia  2. Which pharmacy is medication to be sent to?[facility] on [address]  3. Do they need a 30 day or 90 day supply? 30  4. Would they like a call back once the medication has been sent to the pharmacy? no

## 2014-12-23 NOTE — Telephone Encounter (Signed)
Rx(s) sent to pharmacy electronically. Patient needs appointment. 

## 2014-12-26 ENCOUNTER — Ambulatory Visit: Payer: Medicare Other

## 2014-12-30 ENCOUNTER — Telehealth: Payer: Self-pay | Admitting: Cardiovascular Disease

## 2014-12-30 NOTE — Telephone Encounter (Signed)
If she cannot afford; then try statin if she is able to take. Try atorvastatin 10 mg

## 2014-12-30 NOTE — Telephone Encounter (Signed)
Pt says she would like to get another cholesterol medicine in the place of Zetia,something that is as good as Zetia. Zetia is getting too expensive. Pt says she also has cancer,please be sure it is something that will not interact with her cancer medicine. Please call,she will give you more information if needed.

## 2014-12-30 NOTE — Telephone Encounter (Signed)
Spoke to patient She states her co -pay has gone up to $75 plus a deductible of$200. She states she wuld like something else if possible. RN informed patient their was not another medication like ZETIA RN informed patient - 30 day saving card as well it maybe assistance from drug company if she is interested. Will be glad to mail her the information. She verbalized understanding.  will defer to Dr Claiborne Billings TO SEE IF THERE ARE ANY OTHER OPTIONS.

## 2014-12-31 NOTE — Telephone Encounter (Signed)
lmom 

## 2015-01-01 NOTE — Telephone Encounter (Signed)
I spoke with patient and she is hesitant to try a statin at this time.  She will contact us if she changes her mind.

## 2015-01-02 ENCOUNTER — Ambulatory Visit (HOSPITAL_BASED_OUTPATIENT_CLINIC_OR_DEPARTMENT_OTHER): Payer: 59

## 2015-01-02 DIAGNOSIS — C9110 Chronic lymphocytic leukemia of B-cell type not having achieved remission: Secondary | ICD-10-CM

## 2015-01-02 MED ORDER — DIPHENHYDRAMINE HCL 25 MG PO CAPS
ORAL_CAPSULE | ORAL | Status: AC
  Filled 2015-01-02: qty 1

## 2015-01-02 MED ORDER — DIPHENHYDRAMINE HCL 25 MG PO CAPS
25.0000 mg | ORAL_CAPSULE | Freq: Once | ORAL | Status: AC
  Administered 2015-01-02: 25 mg via ORAL

## 2015-01-02 MED ORDER — ACETAMINOPHEN 325 MG PO TABS
650.0000 mg | ORAL_TABLET | Freq: Once | ORAL | Status: AC
  Administered 2015-01-02: 650 mg via ORAL

## 2015-01-02 MED ORDER — IMMUNE GLOBULIN (HUMAN) 20 GM/200ML IV SOLN
35.0000 g | Freq: Once | INTRAVENOUS | Status: AC
  Administered 2015-01-02: 35 g via INTRAVENOUS
  Filled 2015-01-02: qty 350

## 2015-01-02 MED ORDER — ACETAMINOPHEN 325 MG PO TABS
ORAL_TABLET | ORAL | Status: AC
  Filled 2015-01-02: qty 2

## 2015-01-02 NOTE — Patient Instructions (Signed)

## 2015-01-06 ENCOUNTER — Ambulatory Visit: Payer: Self-pay | Admitting: Physician Assistant

## 2015-01-13 ENCOUNTER — Ambulatory Visit: Payer: Self-pay | Admitting: Physician Assistant

## 2015-01-15 ENCOUNTER — Other Ambulatory Visit: Payer: Medicare Other

## 2015-01-15 ENCOUNTER — Ambulatory Visit: Payer: Medicare Other | Admitting: Oncology

## 2015-01-18 ENCOUNTER — Other Ambulatory Visit: Payer: Self-pay | Admitting: Oncology

## 2015-01-18 DIAGNOSIS — C9110 Chronic lymphocytic leukemia of B-cell type not having achieved remission: Secondary | ICD-10-CM

## 2015-01-21 ENCOUNTER — Telehealth: Payer: Self-pay | Admitting: Cardiovascular Disease

## 2015-01-21 MED ORDER — NEBIVOLOL HCL 10 MG PO TABS: 30.0000 mg | ORAL_TABLET | Freq: Every day | ORAL | Status: DC

## 2015-01-21 NOTE — Telephone Encounter (Signed)
°  1. Which medications need to be refilled? Bystolic 10 mg enough until her appt on 01-27-15  2. Which pharmacy is medication to be sent to?[facility] [address]  3. Do they need a 30 day or 90 day supply? #21  4. Would they like a call back once the medication has been sent to the pharmacy? yes

## 2015-01-21 NOTE — Telephone Encounter (Signed)
Refill submitted to patient's preferred pharmacy. Informed patient.  

## 2015-01-22 ENCOUNTER — Other Ambulatory Visit (HOSPITAL_BASED_OUTPATIENT_CLINIC_OR_DEPARTMENT_OTHER): Payer: 59

## 2015-01-22 ENCOUNTER — Ambulatory Visit (HOSPITAL_BASED_OUTPATIENT_CLINIC_OR_DEPARTMENT_OTHER): Payer: 59 | Admitting: Oncology

## 2015-01-22 ENCOUNTER — Encounter: Payer: Self-pay | Admitting: Oncology

## 2015-01-22 VITALS — BP 158/74 | HR 67 | Temp 97.8°F | Resp 18 | Ht 62.0 in | Wt 208.3 lb

## 2015-01-22 DIAGNOSIS — G4733 Obstructive sleep apnea (adult) (pediatric): Secondary | ICD-10-CM

## 2015-01-22 DIAGNOSIS — R5381 Other malaise: Secondary | ICD-10-CM

## 2015-01-22 DIAGNOSIS — C9110 Chronic lymphocytic leukemia of B-cell type not having achieved remission: Secondary | ICD-10-CM

## 2015-01-22 DIAGNOSIS — D801 Nonfamilial hypogammaglobulinemia: Secondary | ICD-10-CM

## 2015-01-22 LAB — COMPREHENSIVE METABOLIC PANEL (CC13)
ALBUMIN: 4 g/dL (ref 3.5–5.0)
ALT: 33 U/L (ref 0–55)
AST: 33 U/L (ref 5–34)
Alkaline Phosphatase: 69 U/L (ref 40–150)
Anion Gap: 10 mEq/L (ref 3–11)
BUN: 14.2 mg/dL (ref 7.0–26.0)
CALCIUM: 8.9 mg/dL (ref 8.4–10.4)
CHLORIDE: 98 meq/L (ref 98–109)
CO2: 26 mEq/L (ref 22–29)
Creatinine: 0.8 mg/dL (ref 0.6–1.1)
EGFR: 78 mL/min/{1.73_m2} — ABNORMAL LOW (ref >90)
Glucose: 101 mg/dl (ref 70–140)
Potassium: 3.6 mEq/L (ref 3.5–5.1)
SODIUM: 134 meq/L — AB (ref 136–145)
TOTAL PROTEIN: 6.6 g/dL (ref 6.4–8.3)
Total Bilirubin: 0.44 mg/dL (ref 0.20–1.20)

## 2015-01-22 LAB — CBC WITH DIFFERENTIAL/PLATELET
BASO%: 0.2 % (ref 0.0–2.0)
Basophils Absolute: 0.1 10*3/uL (ref 0.0–0.1)
EOS ABS: 0.3 10*3/uL (ref 0.0–0.5)
EOS%: 0.6 % (ref 0.0–7.0)
HCT: 35.8 % (ref 34.8–46.6)
HEMOGLOBIN: 11.6 g/dL (ref 11.6–15.9)
LYMPH#: 50.2 10*3/uL — AB (ref 0.9–3.3)
LYMPH%: 86.9 % — AB (ref 14.0–49.7)
MCH: 31.2 pg (ref 25.1–34.0)
MCHC: 32.4 g/dL (ref 31.5–36.0)
MCV: 96.3 fL (ref 79.5–101.0)
MONO#: 1.4 10*3/uL — AB (ref 0.1–0.9)
MONO%: 2.4 % (ref 0.0–14.0)
NEUT%: 9.9 % — AB (ref 38.4–76.8)
NEUTROS ABS: 5.8 10*3/uL (ref 1.5–6.5)
PLATELETS: 140 10*3/uL — AB (ref 145–400)
RBC: 3.72 10*6/uL (ref 3.70–5.45)
RDW: 14.3 % (ref 11.2–14.5)
WBC: 57.9 10*3/uL — AB (ref 3.9–10.3)

## 2015-01-22 NOTE — Progress Notes (Signed)
OFFICE PROGRESS NOTE   January 22, 2015   Physicians:J.Granfortuna, N.Gorsuch), W.McKeown, Nicki Guadalajara, A.Ross (gyn), F.Jeremy Johann  INTERVAL HISTORY:  Patient is seen, alone for visit, in follow up of CLL for which she has received prophylactic IVIG x3 from 10-03-14 thru 01-02-15 due to previous significant respiratory infections. She has tolerated the IVIG without clear problems,and has had no prolonged or serious infectious illness since this was begun.   Patient has had no fever, no upper or lower respiratory problems and no other symptoms of infection recently. She has had increase in palpitations, with upcoming appointment with cardiology. She may need adjustments in CPAP,which she has used since at least 08-2013. She fatigues with minimal exertion, including with walking in place x 8 min during office break, and recalls that she could not do Christmas shopping without sitting to rest. She is not getting any regular exercise. She denies uncomfortable adenopathy, bleeding, sweats.  No PAC Flu vaccine done Prevnar vaccine per Dr Oneta Rack  ONCOLOGIC HISTORY Patient was diagnosed with stage I CLL in 11-2008 by biopsy of left axillary lymph node found on routine mammograms. She has not required treatment for the CLL, however does have fluctuations in WBC at times related to intermittent steroids used for recurrent sinusitis and bronchitis. Cytogenetics 06-03-14 at [facility] suggests 13q- (no deletions ATM, CEP 12, 13q34 or p53). She has not had bulky adenopathy, previous anemia or thrombocytopenia. She had had numerous infections, mostly respiratory, with low immune globulins. She began prophylactic IVIG 10-03-14, tolerated adequately at total 20 gm, then dose increased to 400 mg/kg = 35 gm on 11-14-14.     Review of systems as above, also: No new or different pain. No cough or chest pain. No wheezing. Appetite good, bowels moving 3-4x weekly as is usual for her ,no abdominal or  pelvic discomfort. Slight pedal edema intermittently, no calf pain. Remainder of 10 point Review of Systems negative.  Objective:  Vital signs in last 24 hours:  BP 158/74 mmHg  Pulse 67  Temp(Src) 97.8 F (36.6 C) (Oral)  Resp 18  Ht 5\' 2"  (1.575 m)  Wt 208 lb 4.8 oz (94.484 kg)  BMI 38.09 kg/m2 Weight down 1 lb Alert, oriented and appropriate. Ambulatory without assistance. Respirations not labored RA with activity in exam room.  HEENT:PERRL, sclerae not icteric. Oral mucosa moist without lesions, posterior pharynx clear.  Neck supple. No JVD.  Lymphatics: lower cervical nodes ~ 1 cm, soft and mobile. no supraclavicular or inguinal adenopathy Resp: clear to auscultation bilaterally and normal percussion bilaterally Cardio: regular rate and rhythm. No gallop. GI: abdomen obese, soft, nontender, not distended, no appreciable mass or organomegaly. Normally active bowel sounds.  Musculoskeletal/ Extremities: trace pjedal edema bilaterally otherwise without pitting edema, cords, tenderness Neuro: no peripheral neuropathy. Otherwise nonfocal. PSYCH appropriate mood and affect Skin without rash, ecchymosis, petechiae   Lab Results:  Results for orders placed or performed in visit on 01/22/15  CBC with Differential  Result Value Ref Range   WBC 57.9 (HH) 3.9 - 10.3 10e3/uL   NEUT# 5.8 1.5 - 6.5 10e3/uL   HGB 11.6 11.6 - 15.9 g/dL   HCT 01/24/15 75.0 - 51.0 %   Platelets 140 (L) 145 - 400 10e3/uL   MCV 96.3 79.5 - 101.0 fL   MCH 31.2 25.1 - 34.0 pg   MCHC 32.4 31.5 - 36.0 g/dL   RBC 71.2 5.24 - 7.99 10e6/uL   RDW 14.3 11.2 - 14.5 %   lymph# 50.2 (H)  0.9 - 3.3 10e3/uL   MONO# 1.4 (H) 0.1 - 0.9 10e3/uL   Eosinophils Absolute 0.3 0.0 - 0.5 10e3/uL   Basophils Absolute 0.1 0.0 - 0.1 10e3/uL   NEUT% 9.9 (L) 38.4 - 76.8 %   LYMPH% 86.9 (H) 14.0 - 49.7 %   MONO% 2.4 0.0 - 14.0 %   EOS% 0.6 0.0 - 7.0 %   BASO% 0.2 0.0 - 2.0 %  Comprehensive metabolic panel (Cmet) - [facility]  Result  Value Ref Range   Sodium 134 (L) 136 - 145 mEq/L   Potassium 3.6 3.5 - 5.1 mEq/L   Chloride 98 98 - 109 mEq/L   CO2 26 22 - 29 mEq/L   Glucose 101 70 - 140 mg/dl   BUN 14.2 7.0 - 26.0 mg/dL   Creatinine 0.8 0.6 - 1.1 mg/dL   Total Bilirubin 0.44 0.20 - 1.20 mg/dL   Alkaline Phosphatase 69 40 - 150 U/L   AST 33 5 - 34 U/L   ALT 33 0 - 55 U/L   Total Protein 6.6 6.4 - 8.3 g/dL   Albumin 4.0 3.5 - 5.0 g/dL   Calcium 8.9 8.4 - 10.4 mg/dL   Anion Gap 10 3 - 11 mEq/L   EGFR 78 (L) >90 ml/min/1.73 m2    CBC reviewed with patient at time of visit. Studies/Results:  No results found.  Medications: I have reviewed the patient's current medications. OK from my standpoint for omega 3 krill oil  DISCUSSION: I have told patient that I would expect palpitations related to IVIG to happen during or just after that infusion, but not out several weeks. She will let me know what cardiology suggests at visit planned 01-27-15, as we could give one addiitional dose of IVIG in next few weeks depending on plan from cardiology. Note patient recalls frequently getting respiratory infections during March prior to the IVIG intervention. I have not set up return visits awaiting information from cardiology.  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 Marked leukocytosis in 03-2014 possibly related to other steroids, improved back to more usual range for her, with no worsening of other counts. Quantitative immune globulins all low at baseline. Original plan has been to continue IVIG monthly at least thru winter. 2.recurrent sinusitis and bronchitis: much shorter and less severe infection than usual shortly after first IVIG and no significant infections since then 3.hepatitis A 4.HTN, elevated cholesterol. Increased palpitations,cardiology evaluation upcoming. Fatigue with even minimal exertion. 5.GERD 6.obstructive sleep apnea: may need CPAP adjusted,  either by Dr Claiborne Billings or Dr Melford Aase 7.flu vaccine done 8.stable mild hypoNa likely from diuretic 9.fatigue with minimal exertion: likely some element of deconditioning,as she is very inactive. Discussed exercise recommendations used after chemo for gyn cancers, specifically increasing activity gradually to 5000 steps daily above baseline. She is interested in this approach and plans to get pedometer. Message to [facility] re present program availability for Y etc.  TIme spent 25 min including >50% counseling and coordination of care. Patient understands discussion and is in agreement with plans.    Lonell Stamos P, MD   01/22/2015, 5:08 PM

## 2015-01-27 ENCOUNTER — Encounter: Payer: Self-pay | Admitting: Physician Assistant

## 2015-01-27 ENCOUNTER — Ambulatory Visit (INDEPENDENT_AMBULATORY_CARE_PROVIDER_SITE_OTHER): Payer: 59 | Admitting: Physician Assistant

## 2015-01-27 VITALS — BP 154/86 | HR 70 | Ht 63.0 in | Wt 208.3 lb

## 2015-01-27 DIAGNOSIS — R7303 Prediabetes: Secondary | ICD-10-CM

## 2015-01-27 DIAGNOSIS — F419 Anxiety disorder, unspecified: Secondary | ICD-10-CM

## 2015-01-27 DIAGNOSIS — R7309 Other abnormal glucose: Secondary | ICD-10-CM

## 2015-01-27 DIAGNOSIS — G4733 Obstructive sleep apnea (adult) (pediatric): Secondary | ICD-10-CM

## 2015-01-27 DIAGNOSIS — I10 Essential (primary) hypertension: Secondary | ICD-10-CM

## 2015-01-27 MED ORDER — NEBIVOLOL HCL 10 MG PO TABS: 30.0000 mg | ORAL_TABLET | Freq: Every day | ORAL | Status: DC

## 2015-01-27 MED ORDER — HYDRALAZINE HCL 50 MG PO TABS: 50.0000 mg | ORAL_TABLET | Freq: Two times a day (BID) | ORAL | Status: DC

## 2015-01-27 NOTE — Assessment & Plan Note (Signed)
I then packed hydralazine 50 mg twice daily. We have refilled her by systolic 30 mg daily.  We'll see her back in approximately 3 weeks to recheck her blood pressure.

## 2015-01-27 NOTE — Assessment & Plan Note (Signed)
We had a long discussion about dietary modifications. I will refer her to a registered dietitian will contact her and check into her insurance to see whether it'll pay for visits.

## 2015-01-27 NOTE — Progress Notes (Signed)
Patient ID: Rebecca Bond, female   DOB: 07-Nov-1946, 69 y.o.   MRN: [id number]    Date:  01/27/2015   ID:  KYE SILVERSTEIN, DOB 01-28-1946, MRN [id number]  PCP:  Alesia Richards, MD  Primary Cardiologist:  Claiborne Billings   Chief Complaint  Patient presents with  . Follow-up    patient reports episodes where her heart races-she noticies at night and she just doesn't feel well.     History of Present Illness: ILEANE Bond is a 69 y.o. female with a history of hypertension, CLL followed by Dr. Beryle Beams, obesity, anxiety, hepatitis A, hyperlipidemia, prediabetes, depression, sleep apnea on CPAP, GERD.[name] Kitchen She is a medication intolerances to Hyzaar which causes angioedema amlodipine and Cardizem.  She's not been seen in the office since April 2014. She is here today because she needs refills on her bystolic and feels that her heart rate is getting elevated at times. When she was seen last Dr. Claiborne Billings had increased her by systolic to 30 mg daily. She was also on hydralazine 50 mg twice daily at that time. However, she is not on the hydralazine currently.  He says that she can hear her heart rate and it is beating fast. In fact while she was sitting on the table she said her heart rate was beating fast when in fact it was 70 bpm. She complains of a very stressful job which is the same as when I saw her 2 years ago. She states that she is exhausted when she gets home from work and has no time for exercise. She does wear her CPAP nightly. She has no complaints of shortness of breath or dizziness and otherwise denies nausea, vomiting, fever, chest pain, orthopnea, PND, cough, congestion, abdominal pain, hematochezia, melena, lower extremity edema, claudication.  Wt Readings from Last 3 Encounters:  01/27/15 208 lb 4.8 oz (94.484 kg)  01/22/15 208 lb 4.8 oz (94.484 kg)  12/15/14 209 lb (94.802 kg)     Past Medical History  Diagnosis Date  . Benign essential HTN 11/22/2011  . CLL (chronic  lymphocytic leukemia)   . Pneumonia 01/2010  . History of bronchitis 08/02/2012    "maybe once/yr if that much; last time 01/2010"  . Recurrent sinus infections 08/02/2012  . 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 High cholesterol   . 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 Prediabetes   . Anxiety   . Depression   . GERD (gastroesophageal reflux disease)   . OSA (obstructive sleep apnea)     Current Outpatient Prescriptions  Medication Sig Dispense Refill  . ALPRAZolam (XANAX) 1 MG tablet TAKE 1/2 TO 1 TABLET BY MOUTH 3 TIMES A DAY AS NEEDED FOR ANXIETY 90 tablet 5  . Ascorbic Acid (VITAMIN C) 1000 MG tablet Take 1,000 mg by mouth daily.    [name] Kitchen buPROPion (WELLBUTRIN XL) 300 MG 24 hr tablet TAKE 1 TABLET BY MOUTH DAILY FOR MOOD AS DIRECTED 90 tablet 3  . Calcium Carbonate-Vitamin D (CALCIUM + D PO) Take 2 tablets by mouth daily. Calcium = 500mg  / tab and 2000 units of vitamin D    . Cholecalciferol (VITAMIN D PO) Take 1,000 Units by mouth 3 (three) times daily.    [name] Kitchen ezetimibe (ZETIA) 10 MG tablet Take 1 tablet (10 mg total) by mouth at bedtime. <PLEASE MAKE APPOINTMENT FOR REFILLS> 30 tablet 0  . fexofenadine (ALLEGRA) 180 MG tablet Take 180 mg by mouth as needed for allergies.     [name] Kitchen  hydrochlorothiazide (HYDRODIURIL) 12.5 MG tablet Take 1 tablet (12.5 mg total) by mouth daily. 90 tablet 3  . nebivolol (BYSTOLIC) 10 MG tablet Take 3 tablets (30 mg total) by mouth daily. 90 tablet 11  . OVER THE COUNTER MEDICATION Nasocort daily as needed    . OVER THE COUNTER MEDICATION Sustain Ultra Eye drops    . furosemide (LASIX) 40 MG tablet Take 40 mg by mouth 2 (two) times daily as needed. For fluid and swelling.  Takes only as needed.    . hydrALAZINE (APRESOLINE) 50 MG tablet Take 1 tablet (50 mg total) by mouth 2 (two) times daily. 60 tablet 11   No current facility-administered medications for this visit.    Allergies:    Allergies  Allergen Reactions  . Hyzaar  [Losartan Potassium-Hctz] Other (See Comments)    "messed up my sodium counts" swells up lips.  Rebecca Bond [Pseudoephedrine Hcl] Palpitations  . Biaxin [Clarithromycin]     GI Upset  . Celexa [Citalopram]   . Flexeril [Cyclobenzaprine]     "zombie-like" feeling  . Fosamax [Alendronate Sodium]     GI upset  . Iohexol Hives     Code: HIVES, Desc: pt states she broke out in hive 20 yrs ago from IV contrast.     . Losartan     Angioedema  . Meloxicam     GI upset  . Norvasc [Amlodipine] Swelling  . Pseudoephedrine     Palpitations  . Zoloft [Sertraline Hcl]     Social History:  The patient  reports that she quit smoking about 46 years ago. Her smoking use included Cigarettes. She has a 3 pack-year smoking history. She has never used smokeless tobacco. She reports that she drinks about 8.4 oz of alcohol per week. She reports that she does not use illicit drugs.   Family history:   Family History  Problem Relation Age of Onset  . Parkinson's disease Mother   . Hypertension Mother   . Hypertension Maternal Grandmother   . Heart attack Maternal Grandmother     Mild    ROS:  Please see the history of present illness.  All other systems reviewed and negative.   PHYSICAL EXAM: VS:  BP 154/86 mmHg  Pulse 70  Ht 5\' 3"  (1.6 m)  Wt 208 lb 4.8 oz (94.484 kg)  BMI 36.91 kg/m2 Obese, well developed, in no acute distress HEENT: Pupils are equal round react to light accommodation extraocular movements are intact.  Neck: no JVDNo cervical lymphadenopathy. Cardiac: Regular rate and rhythm without murmurs rubs or gallops. Lungs:  clear to auscultation bilaterally, no wheezing, rhonchi or rales Abd: soft, nontender, positive bowel sounds all quadrants, no hepatosplenomegaly Ext: no lower extremity edema.  2+ radial and dorsalis pedis pulses. Skin: warm and dry Neuro:  Grossly normal  EKG:  Normal sinus rhythm rate 70 bpm   ASSESSMENT AND PLAN:  Problem List Items Addressed This Visit     Prediabetes   OSA (obstructive sleep apnea)    Continue CPAP.      Morbid obesity    We had a long discussion about dietary modifications. I will refer her to a registered dietitian will contact her and check into her insurance to see whether it'll pay for visits.      HTN (hypertension) - Primary    I then packed hydralazine 50 mg twice daily. We have refilled her by systolic 30 mg daily.  We'll see her back in approximately 3 weeks to recheck her  blood pressure.      Relevant Medications   nebivolol (BYSTOLIC) tablet   hydrALAZINE (APRESOLINE) tablet   Anxiety    Is likely contributing to her symptoms. She is in fact not tachycardic by thinking she is hearing an elevated heart rate.  She does take Xanax.

## 2015-01-27 NOTE — Assessment & Plan Note (Signed)
Continue CPAP.  

## 2015-01-27 NOTE — Patient Instructions (Addendum)
Your physician has recommended making the following medication changes: START Hydralazine 50 mg - take 1 tablet by mouth twice daily.   Tarri Fuller, PA-C, wants you to follow-up in 3 weeks for a blood pressure check.

## 2015-01-27 NOTE — Assessment & Plan Note (Addendum)
Is likely contributing to her symptoms. She is in fact not tachycardic by thinking she is hearing an elevated heart rate.  She does take Xanax.

## 2015-01-29 NOTE — Addendum Note (Signed)
Addended by: Vear Clock on: 01/29/2015 02:01 PM   Modules accepted: Orders

## 2015-01-30 ENCOUNTER — Encounter: Payer: Self-pay | Admitting: *Deleted

## 2015-01-30 NOTE — Progress Notes (Signed)
[place] [facility]  Clinical Social Work was referred by Futures trader for information regarding exercise programs.  Clinical Social Worker contacted patient by phone to offer support and assess for needs.  CSW provided information on the [employer] program as well  Yoga and TaiChi offered at the cancer center.  Rebecca Bond was appreciative of information and expressed interest in these programs.  CSW encouraged patient to call if she has any questions or concerns.   Polo Riley, MSW, LCSW, OSW-C Clinical Social Worker [facility] [phone]

## 2015-02-02 ENCOUNTER — Ambulatory Visit (INDEPENDENT_AMBULATORY_CARE_PROVIDER_SITE_OTHER): Payer: 59 | Admitting: Physician Assistant

## 2015-02-02 ENCOUNTER — Encounter: Payer: Self-pay | Admitting: Physician Assistant

## 2015-02-02 VITALS — BP 138/80 | HR 72 | Temp 98.2°F | Resp 16 | Ht 63.0 in | Wt 207.0 lb

## 2015-02-02 DIAGNOSIS — R519 Headache, unspecified: Secondary | ICD-10-CM

## 2015-02-02 DIAGNOSIS — Z23 Encounter for immunization: Secondary | ICD-10-CM

## 2015-02-02 DIAGNOSIS — J329 Chronic sinusitis, unspecified: Secondary | ICD-10-CM

## 2015-02-02 DIAGNOSIS — E785 Hyperlipidemia, unspecified: Secondary | ICD-10-CM

## 2015-02-02 DIAGNOSIS — R350 Frequency of micturition: Secondary | ICD-10-CM

## 2015-02-02 DIAGNOSIS — I10 Essential (primary) hypertension: Secondary | ICD-10-CM

## 2015-02-02 DIAGNOSIS — R110 Nausea: Secondary | ICD-10-CM

## 2015-02-02 DIAGNOSIS — R7303 Prediabetes: Secondary | ICD-10-CM

## 2015-02-02 DIAGNOSIS — R7309 Other abnormal glucose: Secondary | ICD-10-CM

## 2015-02-02 DIAGNOSIS — R1084 Generalized abdominal pain: Secondary | ICD-10-CM

## 2015-02-02 DIAGNOSIS — Z79899 Other long term (current) drug therapy: Secondary | ICD-10-CM

## 2015-02-02 DIAGNOSIS — R51 Headache: Secondary | ICD-10-CM

## 2015-02-02 DIAGNOSIS — C9110 Chronic lymphocytic leukemia of B-cell type not having achieved remission: Secondary | ICD-10-CM

## 2015-02-02 DIAGNOSIS — E559 Vitamin D deficiency, unspecified: Secondary | ICD-10-CM

## 2015-02-02 DIAGNOSIS — R109 Unspecified abdominal pain: Secondary | ICD-10-CM

## 2015-02-02 MED ORDER — MINOXIDIL 2.5 MG PO TABS: ORAL_TABLET | ORAL | Status: DC

## 2015-02-02 MED ORDER — ONDANSETRON HCL 4 MG PO TABS: 4.0000 mg | ORAL_TABLET | Freq: Three times a day (TID) | ORAL | Status: DC | PRN

## 2015-02-02 MED ORDER — LEVOFLOXACIN 500 MG PO TABS: 500.0000 mg | ORAL_TABLET | Freq: Every day | ORAL | Status: DC

## 2015-02-02 NOTE — Progress Notes (Addendum)
Assessment and Plan:  Hypertension: Continue medication but will stop hydralazine and start on minoxidil 2.5mg  1/2-1 pill PRN for BP above 150/90, monitor blood pressure at home. Continue DASH diet.  Reminder to go to the ER if any CP, SOB, nausea, dizziness, severe HA, changes vision/speech, left arm numbness and tingling, and jaw pain. Cholesterol: Continue diet and exercise. Check cholesterol.  Pre-diabetes-Continue diet and exercise. Check A1C Vitamin D Def- check level and continue medications.  CLL- continue follow up.  Multitude of symptoms and complicated history- ? UTI/kidney stone-check urine may need CT AB/pelvis, ? Sinus infection- zofran,  TMJ-information given to the patient, no gum/decrease hard foods, warm wet wash clothes, decrease stress, talk with dentist about possible night guard, can do massage, and exercise.  AB pain- so diffuse? Kidney stone, gerd, pancreatitis, check labs, zofran, may need ABX  Continue diet and meds as discussed. Further disposition pending results of labs. OVER 40 minutes of exam, counseling, chart review, referral performed  Addendum: Continuing right back/flank pain with nausea and vomiting- will get CT AB to rule out kidney stone. Out of work for this week. If worse pain go to ER  HPI 69 y.o. female  presents for over due 3 month follow up, last OV was in 09/2014 for CPE- follows up with hypertension, hyperlipidemia, prediabetes, Vitamin D, and complicated history of CLL and has lower back pain/urinary symptoms.   Her blood pressure has not been controlled at home, she went in to see Dr. Claiborne Billings and saw his PA for elevated BP, and she was put on hydralazine 50mg  BId which she states she has not well with it and wants it changed, today their BP is BP: 138/80 mmHg  She does not workout. She denies chest pain, shortness of breath, dizziness.  She is on cholesterol medication and denies myalgias. Her cholesterol is at goal. The cholesterol last visit was:    Lab Results  Component Value Date   CHOL 164 09/30/2014   HDL 57 09/30/2014   LDLCALC 81 09/30/2014   TRIG 129 09/30/2014   CHOLHDL 2.9 09/30/2014    She has been working on diet and exercise for prediabetes, and denies paresthesia of the feet, polydipsia, polyuria and visual disturbances. Last A1C in the office was:  Lab Results  Component Value Date   HGBA1C 5.9* 09/30/2014   Patient is on Vitamin D supplement.   Lab Results  Component Value Date   VD25OH 43 09/30/2014    BMI is Body mass index is 36.68 kg/(m^2)., she is working on diet and exercise.+ OSA on CPAP.  Wt Readings from Last 3 Encounters:  02/02/15 207 lb (93.895 kg)  01/27/15 208 lb 4.8 oz (94.484 kg)  01/22/15 208 lb 4.8 oz (94.484 kg)  Patient was dx'd with CLL in Dec 2009 and been monitored expectantly by Dr Beryle Beams and morerecently by Dr Marko Plume. She is receiving IVIG. 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  Lab Results  Component Value Date   WBC 57.9* 01/22/2015   HGB 11.6 01/22/2015   HCT 35.8 01/22/2015   MCV 96.3 01/22/2015   PLT 140* 01/22/2015   She had acute appenditicits in Jun 2015 and had an appendectomy with Dr. Georganna Skeans 06/04/2014.  She has been having epigsatric pain, lower back pain that radiates to right flank/AB she has also has urinary frequency, incontinence and dark urine. . She has had headache, teeth pain, nausea without vomiting. + nasal congestion.  She has been very stressed at work and states  she has no energy. 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   Current Medications:  Current Outpatient Prescriptions on File Prior to Visit  Medication Sig Dispense Refill  . ALPRAZolam (XANAX) 1 MG tablet TAKE 1/2 TO 1 TABLET BY MOUTH 3 TIMES A DAY AS NEEDED FOR ANXIETY 90 tablet 5  . Ascorbic Acid (VITAMIN C) 1000 MG tablet Take 1,000 mg by mouth daily.    [name] Kitchen buPROPion (WELLBUTRIN XL) 300 MG 24 hr tablet TAKE 1 TABLET BY MOUTH DAILY FOR MOOD AS DIRECTED 90 tablet 3  .  Calcium Carbonate-Vitamin D (CALCIUM + D PO) Take 2 tablets by mouth daily. Calcium = 500mg  / tab and 2000 units of vitamin D    . Cholecalciferol (VITAMIN D PO) Take 1,000 Units by mouth 3 (three) times daily.    [name] Kitchen ezetimibe (ZETIA) 10 MG tablet Take 1 tablet (10 mg total) by mouth at bedtime. <PLEASE MAKE APPOINTMENT FOR REFILLS> 30 tablet 0  . fexofenadine (ALLEGRA) 180 MG tablet Take 180 mg by mouth as needed for allergies.     . hydrALAZINE (APRESOLINE) 50 MG tablet Take 1 tablet (50 mg total) by mouth 2 (two) times daily. 60 tablet 11  . hydrochlorothiazide (HYDRODIURIL) 12.5 MG tablet Take 1 tablet (12.5 mg total) by mouth daily. 90 tablet 3  . nebivolol (BYSTOLIC) 10 MG tablet Take 3 tablets (30 mg total) by mouth daily. 90 tablet 11  . OVER THE COUNTER MEDICATION Nasocort daily as needed    . OVER THE COUNTER MEDICATION Sustain Ultra Eye drops    . furosemide (LASIX) 40 MG tablet Take 40 mg by mouth 2 (two) times daily as needed. For fluid and swelling.  Takes only as needed.     No current facility-administered medications on file prior to visit.   Medical History:  Past Medical History  Diagnosis Date  . Benign essential HTN 11/22/2011  . CLL (chronic lymphocytic leukemia)   . Pneumonia 01/2010  . History of bronchitis 08/02/2012    "maybe once/yr if that much; last time 01/2010"  . Recurrent sinus infections 08/02/2012  . 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 High cholesterol   . 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 Prediabetes   . Anxiety   . Depression   . GERD (gastroesophageal reflux disease)   . OSA (obstructive sleep apnea)    Allergies:  Allergies  Allergen Reactions  . Hyzaar [Losartan Potassium-Hctz] Other (See Comments)    "messed up my sodium counts" swells up lips.  [name] [Pseudoephedrine Hcl] Palpitations  . Biaxin [Clarithromycin]     GI Upset  . Celexa [Citalopram]   . Flexeril [Cyclobenzaprine]     "zombie-like" feeling   . Fosamax [Alendronate Sodium]     GI upset  . Iohexol Hives     Code: HIVES, Desc: pt states she broke out in hive 20 yrs ago from IV contrast.     . Losartan     Angioedema  . Meloxicam     GI upset  . Norvasc [Amlodipine] Swelling  . Pseudoephedrine     Palpitations  . Zoloft [Sertraline Hcl]      Review of Systems:  Review of Systems  Constitutional: Positive for fever (last night), chills and malaise/fatigue. Negative for weight loss and diaphoresis.  HENT: Positive for congestion and ear pain. Negative for ear discharge, hearing loss, nosebleeds, sore throat and tinnitus.        + TMJ  Eyes: Negative.  Negative for blurred vision and double vision.  Respiratory: Positive for shortness of breath. Negative for cough, hemoptysis, sputum production, wheezing and stridor.   Cardiovascular: Negative for chest pain, palpitations, orthopnea and claudication.  Gastrointestinal: Positive for heartburn, nausea and abdominal pain (diffuse). Negative for vomiting, diarrhea, constipation, blood in stool and melena.  Genitourinary: Positive for urgency, frequency and flank pain (right). Negative for dysuria and hematuria.  Musculoskeletal: Positive for myalgias and back pain. Negative for joint pain, falls and neck pain.  Skin: Negative.   Neurological: Positive for headaches. Negative for dizziness, tingling, tremors, sensory change, speech change, focal weakness, seizures, loss of consciousness and weakness.  Endo/Heme/Allergies: Negative for environmental allergies and polydipsia. Does not bruise/bleed easily.  Psychiatric/Behavioral: Positive for depression. Negative for suicidal ideas, hallucinations, memory loss and substance abuse. The patient is nervous/anxious. The patient does not have insomnia.     Family history- Review and unchanged Social history- Review and unchanged Physical Exam: BP 138/80 mmHg  Pulse 72  Temp(Src) 98.2 F (36.8 C)  Resp 16  Ht 5\' 3"  (1.6 m)  Wt 207  lb (93.895 kg)  BMI 36.68 kg/m2 Wt Readings from Last 3 Encounters:  02/02/15 207 lb (93.895 kg)  01/27/15 208 lb 4.8 oz (94.484 kg)  01/22/15 208 lb 4.8 oz (94.484 kg)   General Appearance: Well nourished, in no apparent distress. Eyes: PERRLA, EOMs, conjunctiva no swelling or erythema Sinuses: + Frontal/maxillary tenderness ENT/Mouth: Ext aud canals clear, TMs without erythema, bulging. No erythema, swelling, or exudate on post pharynx.  Tonsils not swollen or erythematous. Hearing normal. + TMJ tenderness Neck: Supple, thyroid normal.  Respiratory: Respiratory effort normal, BS equal bilaterally without rales, rhonchi, wheezing or stridor.  Cardio: RRR with no MRGs. Brisk peripheral pulses without edema.  Abdomen: Soft, + BS, obese, diffusely overly tender to palpation, worse epigatric, no peritoneal signs, + guarding, without rebound, hernias, masses. Lymphatics: Non tender without lymphadenopathy.  Musculoskeletal: Full ROM, 5/5 strength, Normal gait, + CVA tenderness on the right, negative straight leg raise.  Skin: Warm, dry without rashes, lesions, ecchymosis.  Neuro: Cranial nerves intact. Normal muscle tone, no cerebellar symptoms. Psych: Awake and oriented X 3, normal affect, Insight and Judgment appropriate.    Vicie Mutters, PA-C 3:30 PM [facility]

## 2015-02-02 NOTE — Patient Instructions (Signed)
Stop hydralazine and can take the minoxidil 1/2-1 pill if BP is greater than 150/90     Bad carbs also include fruit juice, alcohol, and sweet tea. These are empty calories that do not signal to your brain that you are full.   Please remember the good carbs are still carbs which convert into sugar. So please measure them out no more than 1/2-1 cup of rice, oatmeal, pasta, and beans  Veggies are however free foods! Pile them on.   Not all fruit is created equal. Please see the list below, the fruit at the bottom is higher in sugars than the fruit at the top. Please avoid all dried fruits.     What is the TMJ? The temporomandibular (tem-PUH-ro-man-DIB-yoo-ler) joint, or the TMJ, connects the upper and lower jawbones. This joint allows the jaw to open wide and move back and forth when you chew, talk, or yawn.There are also several muscles that help this joint move. There can be muscle tightness and pain in the muscle that can cause several symptoms.  What causes TMJ pain? There are many causes of TMJ pain. Repeated chewing (for example, chewing gum) and clenching your teeth can cause pain in the joint. Some TMJ pain has no obvious cause. What can I do to ease the pain? There are many things you can do to help your pain get better. When you have pain:  Eat soft foods and stay away from chewy foods (for example, taffy) Try to use both sides of your mouth to chew Don't chew gum Massage Don't open your mouth wide (for example, during yawning or singing) Don't bite your cheeks or fingernails Lower your amount of stress and worry Applying a warm, damp washcloth to the joint may help. Over-the-counter pain medicines such as ibuprofen (one brand: Advil) or acetaminophen (one brand: Tylenol) might also help. Do not use these medicines if you are allergic to them or if your doctor told you not to use them. How can I stop the pain from coming back? When your pain is better, you can do these  exercises to make your muscles stronger and to keep the pain from coming back:  Resisted mouth opening: Place your thumb or two fingers under your chin and open your mouth slowly, pushing up lightly on your chin with your thumb. Hold for three to six seconds. Close your mouth slowly. Resisted mouth closing: Place your thumbs under your chin and your two index fingers on the ridge between your mouth and the bottom of your chin. Push down lightly on your chin as you close your mouth. Tongue up: Slowly open and close your mouth while keeping the tongue touching the roof of the mouth. Side-to-side jaw movement: Place an object about one fourth of an inch thick (for example, two tongue depressors) between your front teeth. Slowly move your jaw from side to side. Increase the thickness of the object as the exercise becomes easier Forward jaw movement: Place an object about one fourth of an inch thick between your front teeth and move the bottom jaw forward so that the bottom teeth are in front of the top teeth. Increase the thickness of the object as the exercise becomes easier. These exercises should not be painful. If it hurts to do these exercises, stop doing them and talk to your family doctor.

## 2015-02-03 LAB — URINALYSIS, ROUTINE W REFLEX MICROSCOPIC
Bilirubin Urine: NEGATIVE
Glucose, UA: NEGATIVE mg/dL
Hgb urine dipstick: NEGATIVE
Ketones, ur: NEGATIVE mg/dL
Leukocytes, UA: NEGATIVE
NITRITE: NEGATIVE
Protein, ur: NEGATIVE mg/dL
SPECIFIC GRAVITY, URINE: 1.008 (ref 1.005–1.030)
UROBILINOGEN UA: 0.2 mg/dL (ref 0.0–1.0)
pH: 6.5 (ref 5.0–8.0)

## 2015-02-03 LAB — BASIC METABOLIC PANEL WITH GFR
BUN: 13 mg/dL (ref 6–23)
CHLORIDE: 96 meq/L (ref 96–112)
CO2: 23 mEq/L (ref 19–32)
CREATININE: 0.79 mg/dL (ref 0.50–1.10)
Calcium: 9.3 mg/dL (ref 8.4–10.5)
GFR, EST NON AFRICAN AMERICAN: 77 mL/min
GFR, Est African American: 89 mL/min
Glucose, Bld: 90 mg/dL (ref 70–99)
Potassium: 3.5 mEq/L (ref 3.5–5.3)
Sodium: 130 mEq/L — ABNORMAL LOW (ref 135–145)

## 2015-02-03 LAB — HEPATIC FUNCTION PANEL
ALT: 22 U/L (ref 0–35)
AST: 28 U/L (ref 0–37)
Albumin: 4.2 g/dL (ref 3.5–5.2)
Alkaline Phosphatase: 65 U/L (ref 39–117)
BILIRUBIN INDIRECT: 0.5 mg/dL (ref 0.2–1.2)
Bilirubin, Direct: 0.2 mg/dL (ref 0.0–0.3)
Total Bilirubin: 0.7 mg/dL (ref 0.2–1.2)
Total Protein: 6.2 g/dL (ref 6.0–8.3)

## 2015-02-03 LAB — HEMOGLOBIN A1C
HEMOGLOBIN A1C: 5.8 % — AB (ref <5.7)
Mean Plasma Glucose: 120 mg/dL — ABNORMAL HIGH (ref <117)

## 2015-02-03 LAB — MAGNESIUM: Magnesium: 1.5 mg/dL (ref 1.5–2.5)

## 2015-02-03 LAB — TSH: TSH: 3.935 u[IU]/mL (ref 0.350–4.500)

## 2015-02-03 LAB — AMYLASE: AMYLASE: 38 U/L (ref 0–105)

## 2015-02-04 ENCOUNTER — Other Ambulatory Visit: Payer: Self-pay | Admitting: Physician Assistant

## 2015-02-04 LAB — URINE CULTURE
Colony Count: NO GROWTH
Organism ID, Bacteria: NO GROWTH

## 2015-02-04 NOTE — Addendum Note (Signed)
Addended by: Vicie Mutters R on: 02/04/2015 05:27 PM   Modules accepted: Orders

## 2015-02-06 ENCOUNTER — Ambulatory Visit
Admission: RE | Admit: 2015-02-06 | Discharge: 2015-02-06 | Disposition: A | Discharge status: Home / Self Care | Payer: 59 | Source: Ambulatory Visit | Attending: Physician Assistant | Admitting: Physician Assistant

## 2015-02-06 ENCOUNTER — Other Ambulatory Visit: Payer: Self-pay

## 2015-02-06 DIAGNOSIS — R109 Unspecified abdominal pain: Secondary | ICD-10-CM

## 2015-02-06 DIAGNOSIS — R1084 Generalized abdominal pain: Secondary | ICD-10-CM

## 2015-02-06 DIAGNOSIS — R110 Nausea: Secondary | ICD-10-CM

## 2015-02-06 DIAGNOSIS — R350 Frequency of micturition: Secondary | ICD-10-CM

## 2015-02-06 MED ORDER — BUPROPION HCL ER (XL) 150 MG PO TB24: 150.0000 mg | ORAL_TABLET | Freq: Every day | ORAL | Status: DC

## 2015-02-10 ENCOUNTER — Telehealth: Payer: Self-pay

## 2015-02-10 NOTE — Telephone Encounter (Signed)
Received paper note from front office staff, patient advised that she was told to D/C her HCTZ 12.5 mg , her knees down to feet are swollen, per Rebecca Bond I  Returned call to patient and advised her to start back on the HCTZ 12.5 mg , she is to take one daily for 5 days, then she is to take one tablet every other day. She is to contact office and schedule OV if not better. Patient verbalized understanding.

## 2015-02-17 ENCOUNTER — Telehealth: Payer: Self-pay | Admitting: *Deleted

## 2015-02-17 ENCOUNTER — Other Ambulatory Visit: Payer: Self-pay | Admitting: *Deleted

## 2015-02-17 ENCOUNTER — Telehealth: Payer: Self-pay | Admitting: Oncology

## 2015-02-17 DIAGNOSIS — C9110 Chronic lymphocytic leukemia of B-cell type not having achieved remission: Secondary | ICD-10-CM

## 2015-02-17 DIAGNOSIS — D801 Nonfamilial hypogammaglobulinemia: Secondary | ICD-10-CM

## 2015-02-17 NOTE — Telephone Encounter (Signed)
-----   Message from Gordy Levan, MD sent at 02/17/2015 11:12 AM EDT ----- Fine to set up late afternoon visit with me + lab CBC CMET in next few weeks, with IVIG within a few days or week or so after MD. OK to use afternoon new patient spot, x 30 min thanks

## 2015-02-17 NOTE — Telephone Encounter (Signed)
Per pof appts were made and per kristin she is calling the pt as she has other things to speak with her about   anne

## 2015-02-17 NOTE — Telephone Encounter (Signed)
PT. ALSO HAS HAD A SINUS INFECTION AND A KIDNEY STONE WHICH DR.MCKEOWN'S PA, AMANDA, PRESCRIBED AN ANTIBIOTIC. PT. FINISHED THE ANTIBIOTIC ON 02/10/15. SHE IS NEEDING AN APPOINTMENT WITH DR.LIVESAY AND POSSIBLE IVIG. THIS NOTE WAS ROUTED TO Tarkio AND KRISTEN FLINN,RN.

## 2015-02-17 NOTE — Telephone Encounter (Signed)
Called and left VM for patient on cell phone with lab and MD appt on 3/24 starting at 2pm and infusion appt on 3/30 for IVIG if needed. Instructed patient to call back to [facility] to confirm she received message and is agreeable to appts.

## 2015-02-19 NOTE — Telephone Encounter (Signed)
Patient called back requesting to move lab appt back to 3:30pm and Dr. Marko Plume to 4pm. Appts rescheduled and patient notified of the change.

## 2015-02-20 ENCOUNTER — Ambulatory Visit: Payer: 59 | Admitting: Pharmacist Clinician (PhC)/ Clinical Pharmacy Specialist

## 2015-02-25 ENCOUNTER — Other Ambulatory Visit: Payer: Self-pay | Admitting: Oncology

## 2015-02-26 ENCOUNTER — Ambulatory Visit (HOSPITAL_BASED_OUTPATIENT_CLINIC_OR_DEPARTMENT_OTHER): Payer: 59 | Admitting: Oncology

## 2015-02-26 ENCOUNTER — Other Ambulatory Visit (HOSPITAL_BASED_OUTPATIENT_CLINIC_OR_DEPARTMENT_OTHER): Payer: 59

## 2015-02-26 ENCOUNTER — Ambulatory Visit: Payer: 59 | Admitting: Oncology

## 2015-02-26 ENCOUNTER — Other Ambulatory Visit: Payer: 59

## 2015-02-26 VITALS — BP 153/54 | HR 60 | Temp 98.0°F | Resp 18 | Ht 63.0 in | Wt 208.9 lb

## 2015-02-26 DIAGNOSIS — E780 Pure hypercholesterolemia: Secondary | ICD-10-CM

## 2015-02-26 DIAGNOSIS — C9110 Chronic lymphocytic leukemia of B-cell type not having achieved remission: Secondary | ICD-10-CM

## 2015-02-26 DIAGNOSIS — D801 Nonfamilial hypogammaglobulinemia: Secondary | ICD-10-CM | POA: Diagnosis not present

## 2015-02-26 DIAGNOSIS — K219 Gastro-esophageal reflux disease without esophagitis: Secondary | ICD-10-CM | POA: Diagnosis not present

## 2015-02-26 DIAGNOSIS — G4733 Obstructive sleep apnea (adult) (pediatric): Secondary | ICD-10-CM | POA: Diagnosis not present

## 2015-02-26 DIAGNOSIS — R5381 Other malaise: Secondary | ICD-10-CM

## 2015-02-26 DIAGNOSIS — Z6837 Body mass index (BMI) 37.0-37.9, adult: Secondary | ICD-10-CM

## 2015-02-26 DIAGNOSIS — B159 Hepatitis A without hepatic coma: Secondary | ICD-10-CM

## 2015-02-26 DIAGNOSIS — I10 Essential (primary) hypertension: Secondary | ICD-10-CM

## 2015-02-26 LAB — CBC WITH DIFFERENTIAL/PLATELET
BASO%: 0.2 % (ref 0.0–2.0)
BASOS ABS: 0.1 10*3/uL (ref 0.0–0.1)
EOS ABS: 0.5 10*3/uL (ref 0.0–0.5)
EOS%: 0.7 % (ref 0.0–7.0)
HCT: 35.6 % (ref 34.8–46.6)
HEMOGLOBIN: 11.5 g/dL — AB (ref 11.6–15.9)
LYMPH%: 88.4 % — AB (ref 14.0–49.7)
MCH: 31.1 pg (ref 25.1–34.0)
MCHC: 32.2 g/dL (ref 31.5–36.0)
MCV: 96.6 fL (ref 79.5–101.0)
MONO#: 1 10*3/uL — AB (ref 0.1–0.9)
MONO%: 1.4 % (ref 0.0–14.0)
NEUT#: 6.3 10*3/uL (ref 1.5–6.5)
NEUT%: 9.3 % — AB (ref 38.4–76.8)
Platelets: 141 10*3/uL — ABNORMAL LOW (ref 145–400)
RBC: 3.69 10*6/uL — ABNORMAL LOW (ref 3.70–5.45)
RDW: 14 % (ref 11.2–14.5)
WBC: 67.9 10*3/uL (ref 3.9–10.3)
lymph#: 60 10*3/uL — ABNORMAL HIGH (ref 0.9–3.3)

## 2015-02-26 LAB — COMPREHENSIVE METABOLIC PANEL (CC13)
ALT: 21 U/L (ref 0–55)
AST: 24 U/L (ref 5–34)
Albumin: 4.1 g/dL (ref 3.5–5.0)
Alkaline Phosphatase: 65 U/L (ref 40–150)
Anion Gap: 11 mEq/L (ref 3–11)
BUN: 16.4 mg/dL (ref 7.0–26.0)
CHLORIDE: 101 meq/L (ref 98–109)
CO2: 23 mEq/L (ref 22–29)
CREATININE: 0.8 mg/dL (ref 0.6–1.1)
Calcium: 9.3 mg/dL (ref 8.4–10.4)
EGFR: 73 mL/min/{1.73_m2} — ABNORMAL LOW (ref >90)
Glucose: 103 mg/dl (ref 70–140)
Potassium: 4.2 mEq/L (ref 3.5–5.1)
Sodium: 135 mEq/L — ABNORMAL LOW (ref 136–145)
Total Bilirubin: 0.63 mg/dL (ref 0.20–1.20)
Total Protein: 6.6 g/dL (ref 6.4–8.3)

## 2015-02-26 LAB — TECHNOLOGIST REVIEW

## 2015-02-27 ENCOUNTER — Encounter: Payer: Self-pay | Admitting: Oncology

## 2015-02-27 ENCOUNTER — Telehealth: Payer: Self-pay | Admitting: Oncology

## 2015-02-27 NOTE — Progress Notes (Signed)
OFFICE PROGRESS NOTE   February 26, 2015   Physicians: (J.Granfortuna, N.Gorsuch), W.McKeown, Shelva Majestic, A.Ross (gyn), F.Seth Bake  INTERVAL HISTORY:   Patient is seen, alone for visit, in continuing attention to CLL for which she has received prophylactic IVIG x3 from 10-03-14 thru 01-02-15, due to tendency to severe respiratory infections. She has tolerated the IVIG without difficulty and has required treatment with antibiotics only ~ 2x since this was begun, including sinus infection early March which resolved quickly with treatment by PCP.  Patient had first kidney stone also earlier this month, found by CT AP done 02-06-15 because of pain. That scan did not have any increase in adenopathy compared with 2014, and normal sized spleen (see below). She has no respiratory or other symptoms of infection now. She has had no bleeding, no fevers or sweats and no uncomfortable adenopathy. She still complains of fatigue, but has not begun any gradual exercise program as we had discussed at last visit; she did get information about exercise programs available thru [facility] [address]  Appetite is fine.   No PAC Flu vaccine done Prevnar vaccine per Dr Melford Aase  ONCOLOGIC HISTORY Patient was diagnosed with stage I CLL in 11-2008 by biopsy of left axillary lymph node found on routine mammograms. She has not required treatment for the CLL, however does have fluctuations in WBC at times related to intermittent steroids used for recurrent sinusitis and bronchitis. Cytogenetics 06-03-14 at [facility] suggests 13q- (no deletions ATM, CEP 12, 13q34 or p53). She has not had bulky adenopathy, previous anemia or thrombocytopenia. She had had numerous infections, mostly respiratory, with low immune globulins. She began prophylactic IVIG 10-03-14, tolerated adequately at total 20 gm, then dose increased to 400 mg/kg = 35 gm on 11-14-14.   Review of systems as above, also: No GI symptoms. No LE swelling. No new or  different pain. Continues to work full time [employer] 10 point Review of Systems negative.  Objective:  Vital signs in last 24 hours:  BP 153/54 mmHg  Pulse 60  Temp(Src) 98 F (36.7 C) (Oral)  Resp 18  Ht _0  (1.6 m)  Wt 208 lb 14.4 oz (94.756 kg)  BMI 37.01 kg/m2 Weight stable Alert, oriented and appropriate. Ambulatory without difficulty, respirations not labored RA, looks comfortable  HEENT:PERRL, sclerae not icteric. Oral mucosa moist without lesions, posterior pharynx clear.  Neck supple. No JVD.  Lymphatics:soft 1 cm bilateral lower cervical nodes, without suraclavicular or inguinal adenopathy Resp: clear to auscultation bilaterally and normal percussion bilaterally Cardio: regular rate and rhythm. No gallop. GI: soft, nontender, not distended, no mass or organomegaly. Normally active bowel sounds. Musculoskeletal/ Extremities: without pitting edema, cords, tenderness Neuro:  nonfocal  Psych appropriate mood and affect Skin without rash, ecchymosis, petechiae   Lab Results:  Results for orders placed or performed in visit on 02/26/15  TECHNOLOGIST REVIEW  Result Value Ref Range   Technologist Review Variant lymphs, few smudge cells present   CBC with Differential  Result Value Ref Range   WBC 67.9 (HH) 3.9 - 10.3 10e3/uL   NEUT# 6.3 1.5 - 6.5 10e3/uL   HGB 11.5 (L) 11.6 - 15.9 g/dL   HCT 35.6 34.8 - 46.6 %   Platelets 141 (L) 145 - 400 10e3/uL   MCV 96.6 79.5 - 101.0 fL   MCH 31.1 25.1 - 34.0 pg   MCHC 32.2 31.5 - 36.0 g/dL   RBC 3.69 (L) 3.70 - 5.45 10e6/uL   RDW 14.0 11.2 - 14.5 %  lymph# 60.0 (H) 0.9 - 3.3 10e3/uL   MONO# 1.0 (H) 0.1 - 0.9 10e3/uL   Eosinophils Absolute 0.5 0.0 - 0.5 10e3/uL   Basophils Absolute 0.1 0.0 - 0.1 10e3/uL   NEUT% 9.3 (L) 38.4 - 76.8 %   LYMPH% 88.4 (H) 14.0 - 49.7 %   MONO% 1.4 0.0 - 14.0 %   EOS% 0.7 0.0 - 7.0 %   BASO% 0.2 0.0 - 2.0 %  Comprehensive metabolic panel (Cmet) - [facility]  Result Value Ref Range   Sodium  135 (L) 136 - 145 mEq/L   Potassium 4.2 3.5 - 5.1 mEq/L   Chloride 101 98 - 109 mEq/L   CO2 23 22 - 29 mEq/L   Glucose 103 70 - 140 mg/dl   BUN 16.4 7.0 - 26.0 mg/dL   Creatinine 0.8 0.6 - 1.1 mg/dL   Total Bilirubin 0.63 0.20 - 1.20 mg/dL   Alkaline Phosphatase 65 40 - 150 U/L   AST 24 5 - 34 U/L   ALT 21 0 - 55 U/L   Total Protein 6.6 6.4 - 8.3 g/dL   Albumin 4.1 3.5 - 5.0 g/dL   Calcium 9.3 8.4 - 10.4 mg/dL   Anion Gap 11 3 - 11 mEq/L   EGFR 73 (L) >90 ml/min/1.73 m2     Studies/Results: CT ABDOMEN AND PELVIS WITHOUT CONTRAST  02-06-2015  COMPARISON: 06/04/2014  FINDINGS: The lung bases are free of acute infiltrate or sizable effusion.  The gallbladder has been surgically removed. The liver again demonstrates a rounded hypodensity within the right lobe which is stable from the prior study. This is consistent with a small cysts. The spleen, adrenal glands and pancreas are within normal limits. The kidneys are well visualized bilaterally and reveal no obstructive change. A tiny stone is noted in the lower pole of the right kidney.  The appendix has been surgically removed. The uterus has been removed as well. The bladder is well distended. No pelvic mass lesion is seen. Stable lymph nodes are noted in the inguinal regions as well as iliac chains bilaterally. The overall appearance is similar to that seen on the prior exams dating back to 2014.  IMPRESSION: Postsurgical changes.  Stable hepatic cyst.  Tiny nonobstructing right renal stone.  Stable bilateral pelvic and inguinal lymph nodes dating back to 2014  Medications: I have reviewed the patient's current medications. She uses steroid nasal spray and allegra for environmental allergies  DISCUSSION: Patient is pleased that she has done better than usual from standpoint of respiratory infections since on IVIG, and would like to do one additional treatment then likely hold off thru summer and fall. She  continues with good handwashing and is very careful to avoid exposure to individuals who might be sick.  Confirmed with managed care that insurance approval for IVIG still active.  Assessment/Plan:  1.CLL with hypogammaglobulinemia: initial CLL diagnosis 11-2008, no other treatment to date. Counts essentially stable and no progression of adenopathy/ no splenomegaly by CT done for other indications.  Quantitative immune globulins all low at baseline. Will give IVIG next week, then hold for at least next several months if doing well. I will see her in ~ 3 months with counts. 2.recurrent sinusitis and bronchitis: much shorter and less severe infection than usual shortly after first IVIG  3.hepatitis A 4.HTN, elevated cholesterol.  5.GERD 6.obstructive sleep apnea: may need CPAP adjusted, either by Dr Claiborne Billings or Dr Melford Aase 7.flu vaccine done 8.stable mild hypoNa likely from diuretic 9.fatigue with minimal  exertion: likely some element of deconditioning. I have encouraged gradually increasing activity, hopefully in some guided exercise program 10.obesity: BMI 37. Needs to increase exercise.  IVIG orders done. All questions answered and patient is in agreement with plans as above. Time spent 25 min including >50% counseling and coordination of care.   Gordy Levan, MD

## 2015-02-27 NOTE — Telephone Encounter (Signed)
Called and left a message with June appointments

## 2015-03-04 ENCOUNTER — Ambulatory Visit (HOSPITAL_BASED_OUTPATIENT_CLINIC_OR_DEPARTMENT_OTHER): Payer: 59

## 2015-03-04 ENCOUNTER — Telehealth: Payer: Self-pay

## 2015-03-04 VITALS — BP 154/77 | HR 57 | Temp 97.0°F | Resp 18

## 2015-03-04 DIAGNOSIS — D801 Nonfamilial hypogammaglobulinemia: Secondary | ICD-10-CM

## 2015-03-04 DIAGNOSIS — C9110 Chronic lymphocytic leukemia of B-cell type not having achieved remission: Secondary | ICD-10-CM

## 2015-03-04 MED ORDER — DIPHENHYDRAMINE HCL 25 MG PO CAPS
ORAL_CAPSULE | ORAL | Status: AC
  Filled 2015-03-04: qty 1

## 2015-03-04 MED ORDER — SODIUM CHLORIDE 0.9 % IV SOLN
INTRAVENOUS | Status: DC
  Administered 2015-03-04: 09:00:00 via INTRAVENOUS

## 2015-03-04 MED ORDER — ACETAMINOPHEN 325 MG PO TABS
650.0000 mg | ORAL_TABLET | Freq: Once | ORAL | Status: AC
  Administered 2015-03-04: 650 mg via ORAL

## 2015-03-04 MED ORDER — IMMUNE GLOBULIN (HUMAN) 20 GM/200ML IV SOLN
35.0000 g | Freq: Once | INTRAVENOUS | Status: AC
  Administered 2015-03-04: 35 g via INTRAVENOUS
  Filled 2015-03-04: qty 350

## 2015-03-04 MED ORDER — ACETAMINOPHEN 325 MG PO TABS
ORAL_TABLET | ORAL | Status: AC
  Filled 2015-03-04: qty 2

## 2015-03-04 MED ORDER — DIPHENHYDRAMINE HCL 25 MG PO CAPS
25.0000 mg | ORAL_CAPSULE | Freq: Once | ORAL | Status: AC
  Administered 2015-03-04: 25 mg via ORAL

## 2015-03-04 NOTE — Patient Instructions (Signed)

## 2015-03-04 NOTE — Telephone Encounter (Signed)
Ms. Duffey left a message requesting that Dr. Mariana Kaufman nurse let Webb Silversmith in scheduling know that is is fine to use the 4 pm appointment slot for visit with 3:30pm lab for June 2016 appointment. Will let Webb Silversmith know That that time frame is fine with Dr. Marko Plume for Ms. Fitzmaurice. Ms. Dimarzo is thinking of planing a trip to [place].  Would this be ok with Dr. Marko Plume.  LM for Ms. Maull that Dr. Marko Plume said that she could take a trip to [place] if she so desires. Ms. Wienke can call back if there are any further questions.

## 2015-03-04 NOTE — Progress Notes (Signed)
Declined 30 minutes post observation (IVIG)

## 2015-03-05 ENCOUNTER — Telehealth: Payer: Self-pay | Admitting: Oncology

## 2015-03-05 NOTE — Telephone Encounter (Signed)
Called and left a message with 3:30/4:00 appointment per louise/dr ll print out

## 2015-03-13 ENCOUNTER — Other Ambulatory Visit: Payer: Self-pay | Admitting: Internal Medicine

## 2015-04-03 ENCOUNTER — Other Ambulatory Visit: Payer: Self-pay | Admitting: Cardiovascular Disease

## 2015-04-03 NOTE — Telephone Encounter (Signed)
Rx(s) sent to pharmacy electronically.  

## 2015-04-03 NOTE — Telephone Encounter (Signed)
Please call her Zeta in today,she is completely out of it.Please call to [facility] out of town on [address] this today please.

## 2015-04-09 ENCOUNTER — Ambulatory Visit: Payer: Self-pay | Admitting: Internal Medicine

## 2015-04-14 ENCOUNTER — Ambulatory Visit: Payer: Self-pay | Admitting: Internal Medicine

## 2015-04-16 ENCOUNTER — Other Ambulatory Visit: Payer: Self-pay

## 2015-04-16 MED ORDER — HYOSCYAMINE SULFATE 0.125 MG PO TABS: ORAL_TABLET | ORAL | Status: DC

## 2015-04-29 ENCOUNTER — Ambulatory Visit: Payer: Self-pay | Admitting: Internal Medicine

## 2015-04-29 ENCOUNTER — Ambulatory Visit (INDEPENDENT_AMBULATORY_CARE_PROVIDER_SITE_OTHER): Payer: 59 | Admitting: Physician Assistant

## 2015-04-29 ENCOUNTER — Encounter: Payer: Self-pay | Admitting: Physician Assistant

## 2015-04-29 VITALS — BP 142/82 | HR 68 | Temp 97.7°F | Resp 16 | Wt 210.0 lb

## 2015-04-29 DIAGNOSIS — K219 Gastro-esophageal reflux disease without esophagitis: Secondary | ICD-10-CM

## 2015-04-29 DIAGNOSIS — R110 Nausea: Secondary | ICD-10-CM

## 2015-04-29 DIAGNOSIS — R1084 Generalized abdominal pain: Secondary | ICD-10-CM

## 2015-04-29 LAB — BASIC METABOLIC PANEL WITH GFR
BUN: 16 mg/dL (ref 6–23)
CALCIUM: 9.5 mg/dL (ref 8.4–10.5)
CHLORIDE: 99 meq/L (ref 96–112)
CO2: 25 meq/L (ref 19–32)
Creat: 0.82 mg/dL (ref 0.50–1.10)
GFR, EST NON AFRICAN AMERICAN: 74 mL/min
GFR, Est African American: 85 mL/min
Glucose, Bld: 105 mg/dL — ABNORMAL HIGH (ref 70–99)
POTASSIUM: 3.9 meq/L (ref 3.5–5.3)
Sodium: 133 mEq/L — ABNORMAL LOW (ref 135–145)

## 2015-04-29 LAB — HEPATIC FUNCTION PANEL
ALBUMIN: 4.4 g/dL (ref 3.5–5.2)
ALT: 21 U/L (ref 0–35)
AST: 26 U/L (ref 0–37)
Alkaline Phosphatase: 70 U/L (ref 39–117)
BILIRUBIN INDIRECT: 0.5 mg/dL (ref 0.2–1.2)
BILIRUBIN TOTAL: 0.6 mg/dL (ref 0.2–1.2)
Bilirubin, Direct: 0.1 mg/dL (ref 0.0–0.3)
Total Protein: 6.8 g/dL (ref 6.0–8.3)

## 2015-04-29 LAB — AMYLASE: Amylase: 52 U/L (ref 0–105)

## 2015-04-29 NOTE — Progress Notes (Signed)
Subjective:    Patient ID: Rebecca Bond, female    DOB: 1946/05/31, 69 y.o.   MRN: [id number]  HPI 70 y.o. WF with history of CLL, HTN, preDM, chol, obesity, s/p choley presents with AB pain. States 3 weeks ago had diarrhea/vomiting and was sent in medication and felt better. The last week she has been having epigastric pain and right flank pain, black in her stool for several days, feeling very bloated/AB distention, she is going back and forth to diarrhea and constipation. She has been taking zantac 150mg  zantac daily, alumina/magnesia/simethicone combo 2 bottles.  Had normal CT AB 02/04/2015. She denies recent NSAID use. No recent travel. No recent ABX use. Does drink 1 glass of wine several nights a week.   Current Outpatient Prescriptions on File Prior to Visit  Medication Sig Dispense Refill  . ALPRAZolam (XANAX) 1 MG tablet TAKE 1/2 TO 1 TABLET BY MOUTH 3 TIMES A DAY AS NEEDED FOR ANXIETY 90 tablet 1  . Ascorbic Acid (VITAMIN C) 1000 MG tablet Take 1,000 mg by mouth daily.    [name] Kitchen buPROPion (WELLBUTRIN XL) 150 MG 24 hr tablet Take 1 tablet (150 mg total) by mouth daily. 90 tablet 1  . CALCIUM & MAGNESIUM CARBONATES PO Take 2 capsules by mouth daily.    . Cholecalciferol (VITAMIN D PO) Take 4,000 Units by mouth 3 (three) times daily.     . fexofenadine (ALLEGRA) 180 MG tablet Take 180 mg by mouth as needed for allergies.     . hydrALAZINE (APRESOLINE) 50 MG tablet Take 1 tablet (50 mg total) by mouth 2 (two) times daily. 60 tablet 11  . hydrochlorothiazide (HYDRODIURIL) 12.5 MG tablet Take 1 tablet (12.5 mg total) by mouth daily. 90 tablet 3  . hyoscyamine (LEVSIN, ANASPAZ) 0.125 MG tablet Take 1-2 tablets every 4 hours as needed for nausea and cramping 30 tablet 0  . nebivolol (BYSTOLIC) 10 MG tablet Take 3 tablets (30 mg total) by mouth daily. 90 tablet 11  . ondansetron (ZOFRAN) 4 MG tablet Take 1 tablet (4 mg total) by mouth every 8 (eight) hours as needed for nausea or vomiting. 30  tablet 1  . OVER THE COUNTER MEDICATION Nasocort daily as needed    . OVER THE COUNTER MEDICATION Sustain Ultra Eye drops    . ZETIA 10 MG tablet TAKE 1 TABLET BY MOUTH AT BEDTIME*NEEDS APPOINTMENT FOR REFILLS* 30 tablet 0  . furosemide (LASIX) 40 MG tablet Take 40 mg by mouth 2 (two) times daily as needed. For fluid and swelling.  Takes only as needed.     No current facility-administered medications on file prior to visit.   Past Medical History  Diagnosis Date  . Benign essential HTN 11/22/2011  . CLL (chronic lymphocytic leukemia)   . Pneumonia 01/2010  . History of bronchitis 08/02/2012    "maybe once/yr if that much; last time 01/2010"  . Recurrent sinus infections 08/02/2012  . 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 High cholesterol   . 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 Prediabetes   . Anxiety   . Depression   . GERD (gastroesophageal reflux disease)   . OSA (obstructive sleep apnea)     Review of Systems  Constitutional: Negative for fever, chills, diaphoresis and fatigue.  HENT: Negative.   Respiratory: Negative.  Negative for cough.   Cardiovascular: Negative.   Gastrointestinal: Positive for nausea, abdominal pain, diarrhea, constipation and abdominal distention. Negative for vomiting, blood in stool,  anal bleeding and rectal pain.       + GERD  Genitourinary: Positive for frequency and flank pain. Negative for dysuria, urgency, hematuria, difficulty urinating and pelvic pain.  Musculoskeletal: Positive for back pain and arthralgias. Negative for myalgias, joint swelling, gait problem, neck pain and neck stiffness.  Skin: Negative.   Neurological: Negative for dizziness and headaches.       Objective:   Physical Exam  Constitutional: She is oriented to person, place, and time. She appears well-developed and well-nourished. No distress.  Cardiovascular: Normal rate and regular rhythm.   No murmur heard. Pulmonary/Chest: Effort normal  and breath sounds normal. She has no wheezes.  Abdominal: Soft. She exhibits no distension and no mass. There is tenderness (epigastric and RUQ). There is guarding. There is no rebound.  Musculoskeletal: Normal range of motion. She exhibits no tenderness.  No CVA tenderness  Neurological: She is alert and oriented to person, place, and time.  Skin: Skin is warm and dry. No rash noted.       Assessment & Plan:  Epigastric pain/GERD- s/p choley, no NSAIDS,- stop wine- given samples of dexilant and diet to follow up- check labs- may need EGD Diarrhea/constipatation/Ab pain- possible IBS- can do the levsin sent in as needed, add benefiber and suggest GI referral. Has seen Dr. Earlean Shawl in the past but states he is no longer in network.

## 2015-04-29 NOTE — Patient Instructions (Signed)
Benefiber is good for constipation/diarrhea/irritable bowel syndrome, it helps with weight loss and can help lower your bad cholesterol. Please do 1-2 TBSP in the morning in water, coffee, or tea. It can take up to a month before you can see a difference with your bowel movements. It is cheapest from [employer], [employer], [employer].    Gastroesophageal Reflux Disease, Adult Gastroesophageal reflux disease (GERD) happens when acid from your stomach flows up into the esophagus. When acid comes in contact with the esophagus, the acid causes soreness (inflammation) in the esophagus. Over time, GERD may create small holes (ulcers) in the lining of the esophagus. CAUSES   Increased body weight. This puts pressure on the stomach, making acid rise from the stomach into the esophagus.  Smoking. This increases acid production in the stomach.  Drinking alcohol. This causes decreased pressure in the lower esophageal sphincter (valve or ring of muscle between the esophagus and stomach), allowing acid from the stomach into the esophagus.  Late evening meals and a full stomach. This increases pressure and acid production in the stomach.  A malformed lower esophageal sphincter. Sometimes, no cause is found. SYMPTOMS   Burning pain in the lower part of the mid-chest behind the breastbone and in the mid-stomach area. This may occur twice a week or more often.  Trouble swallowing.  Sore throat.  Dry cough.  Asthma-like symptoms including chest tightness, shortness of breath, or wheezing. DIAGNOSIS  Your caregiver may be able to diagnose GERD based on your symptoms. In some cases, X-rays and other tests may be done to check for complications or to check the condition of your stomach and esophagus. TREATMENT  Your caregiver may recommend over-the-counter or prescription medicines to help decrease acid production. Ask your caregiver before starting or adding any new medicines.  HOME CARE INSTRUCTIONS   Change the  factors that you can control. Ask your caregiver for guidance concerning weight loss, quitting smoking, and alcohol consumption.  Avoid foods and drinks that make your symptoms worse, such as:  Caffeine or alcoholic drinks.  Chocolate.  Peppermint or mint flavorings.  Garlic and onions.  Spicy foods.  Citrus fruits, such as oranges, lemons, or limes.  Tomato-based foods such as sauce, chili, salsa, and pizza.  Fried and fatty foods.  Avoid lying down for the 3 hours prior to your bedtime or prior to taking a nap.  Eat small, frequent meals instead of large meals.  Wear loose-fitting clothing. Do not wear anything tight around your waist that causes pressure on your stomach.  Raise the head of your bed 6 to 8 inches with wood blocks to help you sleep. Extra pillows will not help.  Only take over-the-counter or prescription medicines for pain, discomfort, or fever as directed by your caregiver.  Do not take aspirin, ibuprofen, or other nonsteroidal anti-inflammatory drugs (NSAIDs). SEEK IMMEDIATE MEDICAL CARE IF:   You have pain in your arms, neck, jaw, teeth, or back.  Your pain increases or changes in intensity or duration.  You develop nausea, vomiting, or sweating (diaphoresis).  You develop shortness of breath, or you faint.  Your vomit is green, yellow, black, or looks like coffee grounds or blood.  Your stool is red, bloody, or black. These symptoms could be signs of other problems, such as heart disease, gastric bleeding, or esophageal bleeding. MAKE SURE YOU:   Understand these instructions.  Will watch your condition.  Will get help right away if you are not doing well or get worse. Document Released:  08/31/2005 Document Revised: 02/13/2012 Document Reviewed: 06/10/2011 Exit[employer] Patient Information 2015 [place], [place]. This information is not intended to replace advice given to you by your health care provider. Make sure you discuss any questions you have  with your health care provider.  Diarrhea Diarrhea is frequent loose and watery bowel movements. It can cause you to feel weak and dehydrated. Dehydration can cause you to become tired and thirsty, have a dry mouth, and have decreased urination that often is dark yellow. Diarrhea is a sign of another problem, most often an infection that will not last long. In most cases, diarrhea typically lasts 2-3 days. However, it can last longer if it is a sign of something more serious. It is important to treat your diarrhea as directed by your caregiver to lessen or prevent future episodes of diarrhea. CAUSES  Some common causes include:  Gastrointestinal infections caused by viruses, bacteria, or parasites.  Food poisoning or food allergies.  Certain medicines, such as antibiotics, chemotherapy, and laxatives.  Artificial sweeteners and fructose.  Digestive disorders. HOME CARE INSTRUCTIONS  Ensure adequate fluid intake (hydration): Have 1 cup (8 oz) of fluid for each diarrhea episode. Avoid fluids that contain simple sugars or sports drinks, fruit juices, whole milk products, and sodas. Your urine should be clear or pale yellow if you are drinking enough fluids. Hydrate with an oral rehydration solution that you can purchase at pharmacies, retail stores, and online. You can prepare an oral rehydration solution at home by mixing the following ingredients together:   - tsp table salt.   tsp baking soda.   tsp salt substitute containing potassium chloride.  1  tablespoons sugar.  1 L (34 oz) of water.  Certain foods and beverages may increase the speed at which food moves through the gastrointestinal (GI) tract. These foods and beverages should be avoided and include:  Caffeinated and alcoholic beverages.  High-fiber foods, such as raw fruits and vegetables, nuts, seeds, and whole grain breads and cereals.  Foods and beverages sweetened with sugar alcohols, such as xylitol, sorbitol, and  mannitol.  Some foods may be well tolerated and may help thicken stool including:  Starchy foods, such as rice, toast, pasta, low-sugar cereal, oatmeal, grits, baked potatoes, crackers, and bagels.  Bananas.  Applesauce.  Add probiotic-rich foods to help increase healthy bacteria in the GI tract, such as yogurt and fermented milk products.  Wash your hands well after each diarrhea episode.  Only take over-the-counter or prescription medicines as directed by your caregiver.  Take a warm bath to relieve any burning or pain from frequent diarrhea episodes. SEEK IMMEDIATE MEDICAL CARE IF:   You are unable to keep fluids down.  You have persistent vomiting.  You have blood in your stool, or your stools are black and tarry.  You do not urinate in 6-8 hours, or there is only a small amount of very dark urine.  You have abdominal pain that increases or localizes.  You have weakness, dizziness, confusion, or light-headedness.  You have a severe headache.  Your diarrhea gets worse or does not get better.  You have a fever or persistent symptoms for more than 2-3 days.  You have a fever and your symptoms suddenly get worse. MAKE SURE YOU:   Understand these instructions.  Will watch your condition.  Will get help right away if you are not doing well or get worse. Document Released: 11/11/2002 Document Revised: 04/07/2014 Document Reviewed: 07/29/2012 [facility] Patient Information 2015 [place], [place]. This information  is not intended to replace advice given to you by your health care provider. Make sure you discuss any questions you have with your health care provider.  Irritable Bowel Syndrome Irritable bowel syndrome (IBS) is caused by a disturbance of normal bowel function and is a common digestive disorder. You may also hear this condition called spastic colon, mucous colitis, and irritable colon. There is no cure for IBS. However, symptoms often gradually improve or  disappear with a good diet, stress management, and medicine. This condition usually appears in late adolescence or early adulthood. Women develop it twice as often as men. CAUSES  After food has been digested and absorbed in the small intestine, waste material is moved into the large intestine, or colon. In the colon, water and salts are absorbed from the undigested products coming from the small intestine. The remaining residue, or fecal material, is held for elimination. Under normal circumstances, gentle, rhythmic contractions of the bowel walls push the fecal material along the colon toward the rectum. In IBS, however, these contractions are irregular and poorly coordinated. The fecal material is either retained too long, resulting in constipation, or expelled too soon, producing diarrhea. SIGNS AND SYMPTOMS  The most common symptom of IBS is abdominal pain. It is often in the lower left side of the abdomen, but it may occur anywhere in the abdomen. The pain comes from spasms of the bowel muscles happening too much and from the buildup of gas and fecal material in the colon. This pain:  Can range from sharp abdominal cramps to a dull, continuous ache.  Often worsens soon after eating.  Is often relieved by having a bowel movement or passing gas. Abdominal pain is usually accompanied by constipation, but it may also produce diarrhea. The diarrhea often occurs right after a meal or upon waking up in the morning. The stools are often soft, watery, and flecked with mucus. Other symptoms of IBS include:  Bloating.  Loss of appetite.  Heartburn.  Backache.  Dull pain in the arms or shoulders.  Nausea.  Burping.  Vomiting.  Gas. IBS may also cause symptoms that are unrelated to the digestive system, such as:  Fatigue.  Headaches.  Anxiety.  Shortness of breath.  Trouble concentrating.  Dizziness. These symptoms tend to come and go. DIAGNOSIS  The symptoms of IBS may seem  like symptoms of other, more serious digestive disorders. Your health care provider may want to perform tests to exclude these disorders.  TREATMENT Many medicines are available to help correct bowel function or relieve bowel spasms and abdominal pain. Among the medicines available are:  Laxatives for severe constipation and to help restore normal bowel habits.  Specific antidiarrheal medicines to treat severe or lasting diarrhea.  Antispasmodic agents to relieve intestinal cramps. Your health care provider may also decide to treat you with a mild tranquilizer or sedative during unusually stressful periods in your life. Your health care provider may also prescribe antidepressant medicine. The use of this medicine has been shown to reduce pain and other symptoms of IBS. Remember that if any medicine is prescribed for you, you should take it exactly as directed. Make sure your health care provider knows how well it worked for you. HOME CARE INSTRUCTIONS   Take all medicines as directed by your health care provider.  Avoid foods that are high in fat or oils, such as heavy cream, butter, frankfurters, sausage, and other fatty meats.  Avoid foods that make you go to the bathroom, such  as fruit, fruit juice, and dairy products.  Cut out carbonated drinks, chewing gum, and "gassy" foods such as beans and cabbage. This may help relieve bloating and burping.  Eat foods with bran, and drink plenty of liquids with the bran foods. This helps relieve constipation.  Keep track of what foods seem to bring on your symptoms.  Avoid emotionally charged situations or circumstances that produce anxiety.  Start or continue exercising.  Get plenty of rest and sleep. Document Released: 11/21/2005 Document Revised: 11/26/2013 Document Reviewed: 07/11/2008 [facility] Patient Information 2015 [place], [place]. This information is not intended to replace advice given to you by your health care provider. Make sure  you discuss any questions you have with your health care provider.

## 2015-04-30 LAB — CBC WITH DIFFERENTIAL/PLATELET
BASOS ABS: 0 10*3/uL (ref 0.0–0.1)
Basophils Relative: 0 % (ref 0–1)
EOS ABS: 0 10*3/uL (ref 0.0–0.7)
EOS PCT: 0 % (ref 0–5)
HCT: 36 % (ref 36.0–46.0)
Hemoglobin: 12.2 g/dL (ref 12.0–15.0)
Lymphocytes Relative: 88 % — ABNORMAL HIGH (ref 12–46)
Lymphs Abs: 52.1 10*3/uL — ABNORMAL HIGH (ref 0.7–4.0)
MCH: 31.9 pg (ref 26.0–34.0)
MCHC: 33.9 g/dL (ref 30.0–36.0)
MCV: 94.2 fL (ref 78.0–100.0)
MPV: 8.5 fL — ABNORMAL LOW (ref 8.6–12.4)
Monocytes Absolute: 1.2 10*3/uL — ABNORMAL HIGH (ref 0.1–1.0)
Monocytes Relative: 2 % — ABNORMAL LOW (ref 3–12)
NEUTROS ABS: 5.9 10*3/uL (ref 1.7–7.7)
NEUTROS PCT: 10 % — AB (ref 43–77)
PLATELETS: 107 10*3/uL — AB (ref 150–400)
RBC: 3.82 MIL/uL — AB (ref 3.87–5.11)
RDW: 14.8 % (ref 11.5–15.5)
WBC: 59.2 10*3/uL — AB (ref 4.0–10.5)

## 2015-04-30 LAB — PATHOLOGIST SMEAR REVIEW

## 2015-05-05 ENCOUNTER — Encounter: Payer: Self-pay | Admitting: Physician Assistant

## 2015-05-05 ENCOUNTER — Ambulatory Visit (INDEPENDENT_AMBULATORY_CARE_PROVIDER_SITE_OTHER): Payer: 59 | Admitting: Physician Assistant

## 2015-05-05 VITALS — BP 152/80 | HR 80 | Ht 62.5 in | Wt 214.0 lb

## 2015-05-05 DIAGNOSIS — K2901 Acute gastritis with bleeding: Secondary | ICD-10-CM | POA: Diagnosis not present

## 2015-05-05 DIAGNOSIS — K2900 Acute gastritis without bleeding: Secondary | ICD-10-CM | POA: Diagnosis not present

## 2015-05-05 DIAGNOSIS — R1013 Epigastric pain: Secondary | ICD-10-CM

## 2015-05-05 MED ORDER — PANTOPRAZOLE SODIUM 40 MG PO TBEC
40.0000 mg | DELAYED_RELEASE_TABLET | Freq: Every day | ORAL | Status: DC
Start: 2015-05-05 — End: 2015-07-01

## 2015-05-05 MED ORDER — SACCHAROMYCES BOULARDII 250 MG PO CAPS: ORAL_CAPSULE | ORAL | Status: DC

## 2015-05-05 NOTE — Patient Instructions (Signed)
Please go to the basement level to our lab for the H Pylori Stool antigen test.  We sent a prescription to [address].  1. Protonix 40 mg. 2. Florastor Probiotic.   Call us in 2 weeks if you are not feeling better.

## 2015-05-05 NOTE — Progress Notes (Signed)
Patient ID: Rebecca Bond, female   DOB: Jan 02, 1946, 69 y.o.   MRN: [id number]   Subjective:    Patient ID: Rebecca Bond, female    DOB: 1946/08/25, 69 y.o.   MRN: [id number]  HPI Rebecca Bond is a pleasant 69 year old white female new to GI, referred by Dr. [name] for evaluation of epigastric pain. Patient had been seen by Dr. Deatra Ina in 2003 for colonoscopy which was normal. She had also been seen by Dr. Earlean Shawl in 2013 for screening colonoscopy which was also normal. Should also undergone EGD at that same time which showed nonerosive gastritis and CLOtest and was negative. Patient does have history of CLL, for which she receives immunoglobulin periodically, she is status post cholecystectomy, has history of obesity, depression, sleep apnea, hypertension. She states that there was a intestinal virus going around her workplace at the beginning of May which she contracted and that was associated with nausea vomiting and diarrhea. She was seen at that time and given Levsin sublingual to use for cramping which was helpful. Then about 10 days ago she developed epigastric pain which she describes as a twisting gnawing wall type of feeling like she was irritated or inflamed. This was located in her epigastrium and radiating around into her right side and was associated with bloating. She was taking Mylanta regularly and got over-the-counter Zantac 150 once daily. This was somewhat helpful but did not resolve her symptoms. She was seen by her PCP last week and given Dexilant  samples. She says she took that for 2 days and then had terrible diarrhea. She stopped the Dexilant ,diarrhea resolved and she went  back to Zantac 150 twice daily. She is concerned because she is still having epigastric discomfort. She is not having any nausea or vomiting no diarrhea melena or hematochezia. She's not on any regular aspirin or NSAIDs. She says her symptoms of actually improved over the past 3 days but have not  resolved.  Labs were reviewed from 04/30/2015 CBC with WBC of 59,000 hemoglobin 12.2 hepatic panel normal amylase normal. She had also had CT scan of the abdomen and pelvis in April 2016 which was unremarkable with the exception of a small stone in the right kidney.  Review of Systems Pertinent positive and negative review of systems were noted in the above HPI section.  All other review of systems was otherwise negative.  Outpatient Encounter Prescriptions as of 05/05/2015  Medication Sig  . ALPRAZolam (XANAX) 1 MG tablet TAKE 1/2 TO 1 TABLET BY MOUTH 3 TIMES A DAY AS NEEDED FOR ANXIETY  . Ascorbic Acid (VITAMIN C) 1000 MG tablet Take 1,000 mg by mouth daily.  [name] Kitchen buPROPion (WELLBUTRIN XL) 150 MG 24 hr tablet Take 1 tablet (150 mg total) by mouth daily.  . Cholecalciferol (VITAMIN D PO) Take 4,000 Units by mouth 3 (three) times daily.   . fexofenadine (ALLEGRA) 180 MG tablet Take 180 mg by mouth as needed for allergies.   . hydrochlorothiazide (HYDRODIURIL) 12.5 MG tablet Take 1 tablet (12.5 mg total) by mouth daily.  . hyoscyamine (LEVSIN, ANASPAZ) 0.125 MG tablet Take 1-2 tablets every 4 hours as needed for nausea and cramping  . nebivolol (BYSTOLIC) 10 MG tablet Take 3 tablets (30 mg total) by mouth daily.  [name] Kitchen OVER THE COUNTER MEDICATION Nasocort daily as needed  . OVER THE COUNTER MEDICATION Sustain Ultra Eye drops  . ZETIA 10 MG tablet TAKE 1 TABLET BY MOUTH AT BEDTIME*NEEDS APPOINTMENT FOR REFILLS*  . pantoprazole (PROTONIX)  40 MG tablet Take 1 tablet (40 mg total) by mouth daily.  [name] Kitchen saccharomyces boulardii (FLORASTOR) 250 MG capsule Take 1 tab daily   No facility-administered encounter medications on file as of 05/05/2015.   Allergies  Allergen Reactions  . Hyzaar [Losartan Potassium-Hctz] Other (See Comments)    "messed up my sodium counts" swells up lips.  [name] [Pseudoephedrine Hcl] Palpitations  . Biaxin [Clarithromycin]     GI Upset  . Celexa [Citalopram]   . Flexeril  [Cyclobenzaprine]     "zombie-like" feeling  . Fosamax [Alendronate Sodium]     GI upset  . Iohexol Hives     Code: HIVES, Desc: pt states she broke out in hive 20 yrs ago from IV contrast.     . Losartan     Angioedema  . Meloxicam     GI upset  . Norvasc [Amlodipine] Swelling  . Pseudoephedrine     Palpitations  . Zoloft [Sertraline Hcl]    Patient Active Problem List   Diagnosis Date Noted  . Acute gastritis with hemorrhage 05/05/2015  . BMI 37.0-37.9, adult 02/27/2015  . Physical deconditioning 02/27/2015  . 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 Vitamin D deficiency 09/30/2014  . Appendicitis 06/04/2014  . HTN (hypertension) 01/08/2014  . Prediabetes   . Anxiety   . Depression   . GERD (gastroesophageal reflux disease)   . OSA (obstructive sleep apnea)   . Angioedema secondary to ACE/ARB 12/25/2013  . 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 Morbid obesity 12/17/2013  . 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 Sleep apnea, obstructive 05/06/2013  . Hepatitis A 08/02/2012  . Recurrent sinus infections 08/02/2012  . CLL (chronic lymphocytic leukemia) 11/22/2011   History   Social History  . Marital Status: Married    Spouse Name: N/A  . Number of Children: 1  . Years of Education: N/A   Occupational History  . CUST SERVICE [employer]   Social History Main Topics  . Smoking status: Former Smoker -- 0.75 packs/day for 4 years    Types: Cigarettes    Quit date: 12/05/1968  . Smokeless tobacco: Never Used  . Alcohol Use: 8.4 oz/week    14 Glasses of wine per week     Comment: 08/02/2012 "couple glasses wine q hs; last time 2-3 wk ago"  . Drug Use: No  . Sexual Activity: Not Currently   Other Topics Concern  . Not on file   Social History Narrative    [name]'s family history includes Heart attack in her maternal grandmother; Hypertension in her maternal grandmother and mother; Parkinson's disease in her mother.      Objective:    Filed  Vitals:   05/05/15 1009  BP: 152/80  Pulse: 80    Physical Exam  well-developed older white female in no acute distress, blood pressure 152/80 pulse 80 height 5 foot 2 weight 214. HEENT; nontraumatic normocephalic EOMI PERRLA sclera anicteric, Neck supple no JVD, Cardiovascular; regular rate and rhythm with S1-S2 no murmur or gallop, Pulmonary ;clear bilaterally, Abdomen ;obese soft , minimal tenderness in the epigastrium there is no guarding or rebound no palpable mass or hepatosplenomegaly bowel sounds are present, Rectal; exam not done, Ext; no clubbing cyanosis or edema skin warm and dry, Psych; mood and affect appropriate       Assessment & Plan:   #1 69 yo female with 10 day hx of epigastric pain consistent with gastritis/duodenitis- improved with PPI and H2 blocker. #2 s/p cholecystectomy #3 CLL  #4 obesity #5 Depression/anxiety #  6 OSA   Plan; Long discussion with patient.and she was reassured, As she is improving would continue medical therapy with either twice a day ranitidine or Protonix 40 mg by mouth daily 1 month. Reviewed a bland diet Add floor store once daily 1 month for abdominal bloating Patient will call back in 2 weeks if her symptoms have not significantly improved or resolved we'll consider EGD and further imaging.  Amy S Esterwood PA-C 05/05/2015   Cc: Vicie Mutters, PA-C

## 2015-05-06 ENCOUNTER — Telehealth: Payer: Self-pay | Admitting: *Deleted

## 2015-05-06 NOTE — Telephone Encounter (Addendum)
Spoke with Rebecca Bond and told her that Rebecca Bond is fine with moving her appointment to September as noted below by Rebecca Bond.  Requested patient ask PCP Rebecca Bond  to send Rebecca Bond a copy of full lab work in June. Rebecca Bond can come for lab work at 3:30 pm and see Rebecca Bond at 4:00pm as pt has to work. POF sent to scheduling.

## 2015-05-06 NOTE — Telephone Encounter (Signed)
Patient called.  Her PCP is going to see her with full lab work up in June.  She also has an appt. With Dr. Edwyna Shell with lab in June.  Would it be ok to r/s Dr.LL's appt to August or September?  Will route to Dr. Natalia Leatherwood nurse.

## 2015-05-06 NOTE — Telephone Encounter (Signed)
Re triage call about apts PCP and LL. Fine to move my appointment to ~ Sept with labs. Please ask for a copy of PCP's labs including blood counts to come to [place]. Needs POF to our schedulers please thanks

## 2015-05-07 NOTE — Progress Notes (Signed)
Reviewed and agree with management. Robert D. Kaplan, M.D., FACG  

## 2015-05-11 ENCOUNTER — Telehealth: Payer: Self-pay | Admitting: Physician Assistant

## 2015-05-12 ENCOUNTER — Other Ambulatory Visit: Payer: Self-pay

## 2015-05-12 DIAGNOSIS — R197 Diarrhea, unspecified: Secondary | ICD-10-CM

## 2015-05-12 NOTE — Telephone Encounter (Signed)
Certainly possible ... 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

## 2015-05-12 NOTE — Telephone Encounter (Signed)
Any thoughts on this?

## 2015-05-12 NOTE — Telephone Encounter (Signed)
Patient does not have solid stool yet. She feels a little better. Wants to have stool tested. She will turn this in today.

## 2015-05-13 ENCOUNTER — Other Ambulatory Visit: Payer: 59

## 2015-05-13 DIAGNOSIS — K2901 Acute gastritis with bleeding: Secondary | ICD-10-CM

## 2015-05-13 DIAGNOSIS — R197 Diarrhea, unspecified: Secondary | ICD-10-CM

## 2015-05-15 LAB — H. PYLORI ANTIGEN, STOOL: H PYLORI AG STL: NEGATIVE

## 2015-05-19 ENCOUNTER — Ambulatory Visit: Payer: 59 | Admitting: Physician Assistant

## 2015-05-28 ENCOUNTER — Other Ambulatory Visit: Payer: 59

## 2015-05-28 ENCOUNTER — Other Ambulatory Visit: Payer: Self-pay | Admitting: Cardiovascular Disease

## 2015-05-28 ENCOUNTER — Ambulatory Visit: Payer: 59 | Admitting: Oncology

## 2015-05-28 NOTE — Telephone Encounter (Signed)
Rx(s) sent to pharmacy electronically.  

## 2015-06-01 ENCOUNTER — Other Ambulatory Visit: Payer: Self-pay | Admitting: *Deleted

## 2015-06-01 ENCOUNTER — Encounter: Payer: Self-pay | Admitting: Internal Medicine

## 2015-06-01 ENCOUNTER — Ambulatory Visit (INDEPENDENT_AMBULATORY_CARE_PROVIDER_SITE_OTHER): Payer: 59 | Admitting: Internal Medicine

## 2015-06-01 ENCOUNTER — Telehealth: Payer: Self-pay | Admitting: *Deleted

## 2015-06-01 VITALS — BP 120/82 | HR 68 | Temp 97.5°F | Resp 16 | Ht 62.5 in | Wt 211.0 lb

## 2015-06-01 DIAGNOSIS — I10 Essential (primary) hypertension: Secondary | ICD-10-CM

## 2015-06-01 DIAGNOSIS — Z79899 Other long term (current) drug therapy: Secondary | ICD-10-CM

## 2015-06-01 DIAGNOSIS — R7309 Other abnormal glucose: Secondary | ICD-10-CM

## 2015-06-01 DIAGNOSIS — C9110 Chronic lymphocytic leukemia of B-cell type not having achieved remission: Secondary | ICD-10-CM

## 2015-06-01 DIAGNOSIS — R7303 Prediabetes: Secondary | ICD-10-CM

## 2015-06-01 DIAGNOSIS — Z780 Asymptomatic menopausal state: Secondary | ICD-10-CM

## 2015-06-01 DIAGNOSIS — E782 Mixed hyperlipidemia: Secondary | ICD-10-CM | POA: Insufficient documentation

## 2015-06-01 DIAGNOSIS — E559 Vitamin D deficiency, unspecified: Secondary | ICD-10-CM

## 2015-06-01 LAB — GI PATHOGEN PANEL BY PCR, STOOL

## 2015-06-01 LAB — BASIC METABOLIC PANEL WITH GFR
BUN: 20 mg/dL (ref 6–23)
CHLORIDE: 95 meq/L — AB (ref 96–112)
CO2: 24 meq/L (ref 19–32)
Calcium: 9.5 mg/dL (ref 8.4–10.5)
Creat: 0.92 mg/dL (ref 0.50–1.10)
GFR, EST AFRICAN AMERICAN: 74 mL/min
GFR, Est Non African American: 64 mL/min
Glucose, Bld: 91 mg/dL (ref 70–99)
POTASSIUM: 3.6 meq/L (ref 3.5–5.3)
SODIUM: 135 meq/L (ref 135–145)

## 2015-06-01 LAB — HEPATIC FUNCTION PANEL
ALBUMIN: 4.6 g/dL (ref 3.5–5.2)
ALT: 17 U/L (ref 0–35)
AST: 23 U/L (ref 0–37)
Alkaline Phosphatase: 64 U/L (ref 39–117)
BILIRUBIN INDIRECT: 0.5 mg/dL (ref 0.2–1.2)
Bilirubin, Direct: 0.1 mg/dL (ref 0.0–0.3)
TOTAL PROTEIN: 6.7 g/dL (ref 6.0–8.3)
Total Bilirubin: 0.6 mg/dL (ref 0.2–1.2)

## 2015-06-01 LAB — MAGNESIUM: Magnesium: 1.7 mg/dL (ref 1.5–2.5)

## 2015-06-01 LAB — TSH: TSH: 3.939 u[IU]/mL (ref 0.350–4.500)

## 2015-06-01 LAB — LIPID PANEL
Cholesterol: 168 mg/dL (ref 0–200)
HDL: 62 mg/dL (ref >=46)
LDL CALC: 84 mg/dL (ref 0–99)
TRIGLYCERIDES: 110 mg/dL (ref <150)
Total CHOL/HDL Ratio: 2.7 Ratio
VLDL: 22 mg/dL (ref 0–40)

## 2015-06-01 MED ORDER — FEXOFENADINE HCL 180 MG PO TABS: 180.0000 mg | ORAL_TABLET | Freq: Every day | ORAL | Status: DC

## 2015-06-01 MED ORDER — EZETIMIBE 10 MG PO TABS: 10.0000 mg | ORAL_TABLET | Freq: Every day | ORAL | Status: DC

## 2015-06-01 MED ORDER — TOPIRAMATE 25 MG PO TABS: ORAL_TABLET | ORAL | Status: DC

## 2015-06-01 NOTE — Telephone Encounter (Signed)
Great.. Not happy that sample was destroyed.. Stool for hpylori is negative -pt aware.. She had called wanting her stool tested as she was concerned she had Listeria- see if still having diarrhea etc- if so can reorder GI pathogen panel-if she is better- the panel is not going to show anything

## 2015-06-01 NOTE — Patient Instructions (Signed)

## 2015-06-01 NOTE — Telephone Encounter (Signed)
Got a call from [facility] and we ordered H Pylori Stool antigen and GI Pathogen panel.  I was told the GI pathogen panel was not able to be resulted.  The sample was disgarded by mistake.

## 2015-06-01 NOTE — Progress Notes (Signed)
Patient ID: Rebecca Bond, female   DOB: 12-05-1946, 69 y.o.   MRN: [id number]   This very nice 69 y.o. MWF presents for 3 month follow up with Hypertension, Hyperlipidemia, Pre-Diabetes and Vitamin D Deficiency.    Patient is treated for HTN & BP has been controlled at home. Today's BP: 120/82 mmHg. Patient has had no complaints of any cardiac type chest pain, palpitations, dyspnea/orthopnea/PND, dizziness, claudication, or dependent edema.   Hyperlipidemia is controlled with diet & meds. Patient denies myalgias or other med SE's. Last Lipids were  At goal - Chol 164; HDL 57; LDL  81; Trig 129 on 09/30/2014.   Also, the patient has history of Morbid Obesity (BMI 37.95) and consequent PreDiabetes with A1c 6.0% in 2012 and has had no symptoms of reactive hypoglycemia, diabetic polys, paresthesias or visual blurring.  Last A1c was 5.8% on 02/02/2015.   Further, the patient also has history of Vitamin D Deficiency and supplements vitamin D without any suspected side-effects. Last vitamin D was  43 on 09/30/2014.  Medication Sig  . ALPRAZolam  1 MG tablet TAKE 1/2 TO 1 TABLET BY MOUTH 3 TIMES A DAY AS NEEDED FOR ANXIETY  . VITAMIN C 1000 MG tablet Take 1,000 mg by mouth daily.  [name] Kitchen buPROPion 150 MG  XL Take 1 tablet (150 mg total) by mouth daily.  [name] Kitchen VITAMIN D Take 4,000 Units by mouth 3 (three) times daily.   [name] Kitchen ezetimibe  10 MG tablet Take 1 tablet (10 mg total) by mouth at bedtime. <PLEASE MAKE APPOINTMENT FOR REFILLS>  . Fexofenadine 80 MG tablet Take 180 mg by mouth as needed for allergies.   . hctz12.5 MG tablet Take 1 tablet (12.5 mg total) by mouth daily.  . hyoscyamine (LEVSIN) 0.125 MG tablet Take 1-2 tablets every 4 hours as needed for nausea and cramping  . nebivolol  10 MG tablet Take 3 tablets (30 mg total) by mouth daily.  . Nasocort  daily as needed  . Sustain Ultra Eye drops   . pantoprazole  40 MG tablet Take 1 tablet (40 mg total) by mouth daily.  [name] Kitchen FLORASTOR   250 MG capsule  Take 1 tab daily   Allergies  Allergen Reactions  . Hyzaar [Losartan Potassium-Hctz] Other (See Comments)    "messed up my sodium counts" swells up lips.  [name] [Pseudoephedrine Hcl] Palpitations  . Biaxin [Clarithromycin]     GI Upset  . Celexa [Citalopram]   . Flexeril [Cyclobenzaprine]     "zombie-like" feeling  . Fosamax [Alendronate Sodium]     GI upset  . Iohexol Hives     Code: HIVES, Desc: pt states she broke out in hive 20 yrs ago from IV contrast.     . Losartan     Angioedema  . Meloxicam     GI upset  . Norvasc [Amlodipine] Swelling  . Pseudoephedrine     Palpitations  . Zoloft [Sertraline Hcl]    PMHx:   Past Medical History  Diagnosis Date  . Benign essential HTN 11/22/2011  . CLL (chronic lymphocytic leukemia)   . Pneumonia 01/2010  . History of bronchitis 08/02/2012    "maybe once/yr if that much; last time 01/2010"  . Recurrent sinus infections 08/02/2012  . 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 High cholesterol   . 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 Prediabetes   . Anxiety   . Depression   . GERD (gastroesophageal reflux disease)   .  OSA (obstructive sleep apnea)    Immunization History  Administered Date(s) Administered  . Influenza,inj,Quad PF,36+ Mos 09/15/2014  . Influenza-Unspecified 08/31/2013  . Pneumococcal-Unspecified 12/28/2010  . Td 07/05/2006   Past Surgical History  Procedure Laterality Date  . Cholecystectomy  1985  . Tonsillectomy and adenoidectomy  1950's  . Breast surgery    . Lymph node biopsy      "determined I had CLL"  . Temporomandibular joint arthroplasty  1980's  . Functional endoscopic sinus surgery  1990's    "cause I kept having sinus infections"  . Abdominal hysterectomy  1980's    "endometrosis"  . Laparoscopic appendectomy N/A 06/04/2014    Procedure: APPENDECTOMY LAPAROSCOPIC;  Surgeon: Zenovia Jarred, MD;  Location: [facility];  Service: General;  Laterality: N/A;   FHx:     Reviewed / unchanged  SHx:    Reviewed / unchanged  Systems Review:  Constitutional: Denies fever, chills, wt changes, headaches, insomnia, fatigue, night sweats, change in appetite. Eyes: Denies redness, blurred vision, diplopia, discharge, itchy, watery eyes.  ENT: Denies discharge, congestion, post nasal drip, epistaxis, sore throat, earache, hearing loss, dental pain, tinnitus, vertigo, sinus pain, snoring.  CV: Denies chest pain, palpitations, irregular heartbeat, syncope, dyspnea, diaphoresis, orthopnea, PND, claudication or edema. Respiratory: denies cough, dyspnea, DOE, pleurisy, hoarseness, laryngitis, wheezing.  Gastrointestinal: Denies dysphagia, odynophagia, heartburn, reflux, water brash, abdominal pain or cramps, nausea, vomiting, bloating, diarrhea, constipation, hematemesis, melena, hematochezia  or hemorrhoids. Genitourinary: Denies dysuria, frequency, urgency, nocturia, hesitancy, discharge, hematuria or flank pain. Musculoskeletal: Denies arthralgias, myalgias, stiffness, jt. swelling, pain, limping or strain/sprain.  Skin: Denies pruritus, rash, hives, warts, acne, eczema or change in skin lesion(s). Neuro: No weakness, tremor, incoordination, spasms, paresthesia or pain. Psychiatric: Denies confusion, memory loss or sensory loss. Endo: Denies change in weight, skin or hair change.  Heme/Lymph: No excessive bleeding, bruising or enlarged lymph nodes.  Physical Exam  BP 120/82 m  Pulse 68  Temp 97.5 F   Resp 16  Ht 5' 2.5"   Wt 211 lb     BMI 37.95   Appears well nourished and in no distress. Eyes: PERRLA, EOMs, conjunctiva no swelling or erythema. Sinuses: No frontal/maxillary tenderness ENT/Mouth: EAC's clear, TM's nl w/o erythema, bulging. Nares clear w/o erythema, swelling, exudates. Oropharynx clear without erythema or exudates. Oral hygiene is good. Tongue normal, non obstructing. Hearing intact.  Neck: Supple. Thyroid nl. Car 2+/2+ without bruits, nodes or  JVD. Chest: Respirations nl with BS clear & equal w/o rales, rhonchi, wheezing or stridor.  Cor: Heart sounds normal w/ regular rate and rhythm without sig. murmurs, gallops, clicks, or rubs. Peripheral pulses normal and equal  without edema.  Abdomen: Soft & bowel sounds normal. Non-tender w/o guarding, rebound, hernias, masses, or organomegaly.  Lymphatics: Unremarkable.  Musculoskeletal: Full ROM all peripheral extremities, joint stability, 5/5 strength, and normal gait.  Skin: Warm, dry without exposed rashes, lesions or ecchymosis apparent.  Neuro: Cranial nerves intact, reflexes equal bilaterally. Sensory-motor testing grossly intact. Tendon reflexes grossly intact.  Pysch: Alert & oriented x 3.  Insight and judgement nl & appropriate. No ideations.  Assessment and Plan:  1. Essential hypertension  - TSH  2. Mixed hyperlipidemia  - Lipid panel  3. Prediabetes  - Hemoglobin A1c - Insulin, random  4. Vitamin D deficiency  - Vit D  25 hydroxy   5. Morbid obesity   6. CLL (chronic lymphocytic leukemia)  - CBC with Differential/Platelet  7. Medication management  - BASIC METABOLIC  PANEL WITH GFR - Hepatic function panel - Magnesium   Recommended regular exercise, BP monitoring, weight control, and discussed med and SE's. Recommended labs to assess and monitor clinical status. Further disposition pending results of labs. Over 30 minutes of exam, counseling, chart review was performed

## 2015-06-02 LAB — CBC WITH DIFFERENTIAL/PLATELET
BASOS ABS: 0 10*3/uL (ref 0.0–0.1)
Basophils Relative: 0 % (ref 0–1)
EOS PCT: 0 % (ref 0–5)
Eosinophils Absolute: 0 10*3/uL (ref 0.0–0.7)
HEMATOCRIT: 35.4 % — AB (ref 36.0–46.0)
Hemoglobin: 11.5 g/dL — ABNORMAL LOW (ref 12.0–15.0)
Lymphocytes Relative: 89 % — ABNORMAL HIGH (ref 12–46)
Lymphs Abs: 53.9 10*3/uL — ABNORMAL HIGH (ref 0.7–4.0)
MCH: 31 pg (ref 26.0–34.0)
MCHC: 32.5 g/dL (ref 30.0–36.0)
MCV: 95.4 fL (ref 78.0–100.0)
MONO ABS: 1.2 10*3/uL — AB (ref 0.1–1.0)
MONOS PCT: 2 % — AB (ref 3–12)
MPV: 8.8 fL (ref 8.6–12.4)
Neutro Abs: 5.5 10*3/uL (ref 1.7–7.7)
Neutrophils Relative %: 9 % — ABNORMAL LOW (ref 43–77)
Platelets: 107 10*3/uL — ABNORMAL LOW (ref 150–400)
RBC: 3.71 MIL/uL — ABNORMAL LOW (ref 3.87–5.11)
RDW: 14.8 % (ref 11.5–15.5)
WBC: 60.6 10*3/uL — ABNORMAL HIGH (ref 4.0–10.5)

## 2015-06-02 LAB — HEMOGLOBIN A1C
HEMOGLOBIN A1C: 6.1 % — AB (ref <5.7)
Mean Plasma Glucose: 128 mg/dL — ABNORMAL HIGH (ref <117)

## 2015-06-02 LAB — VITAMIN D 25 HYDROXY (VIT D DEFICIENCY, FRACTURES): Vit D, 25-Hydroxy: 26 ng/mL — ABNORMAL LOW (ref 30–100)

## 2015-06-02 LAB — INSULIN, RANDOM: Insulin: 10.3 u[IU]/mL (ref 2.0–19.6)

## 2015-06-02 NOTE — Telephone Encounter (Signed)
LM for the patient to please return my call. We wanted to know if she was still having diarrhea.

## 2015-06-11 ENCOUNTER — Other Ambulatory Visit: Payer: Self-pay

## 2015-06-11 MED ORDER — HYDROCHLOROTHIAZIDE 12.5 MG PO TABS: 12.5000 mg | ORAL_TABLET | Freq: Every day | ORAL | Status: DC

## 2015-06-12 ENCOUNTER — Other Ambulatory Visit: Payer: Self-pay | Admitting: Internal Medicine

## 2015-06-12 NOTE — Telephone Encounter (Signed)
LM for patient to call us if she is still having diarrhea.[name] Kitchen

## 2015-07-01 ENCOUNTER — Telehealth: Payer: Self-pay | Admitting: Gastroenterology

## 2015-07-01 MED ORDER — PANTOPRAZOLE SODIUM 40 MG PO TBEC: 40.0000 mg | DELAYED_RELEASE_TABLET | Freq: Every day | ORAL | Status: DC

## 2015-07-01 NOTE — Telephone Encounter (Signed)
Called pt to inform med sent. No answer.  Sent to [facility] first then had to resend to [facility].... Cancelled rx at [facility]

## 2015-07-15 ENCOUNTER — Telehealth: Payer: Self-pay | Admitting: Oncology

## 2015-07-15 NOTE — Telephone Encounter (Signed)
Spoke with patient regarding 9/8 late appointment and per dr Marko Plume this is ok

## 2015-08-10 ENCOUNTER — Other Ambulatory Visit: Payer: Self-pay | Admitting: Oncology

## 2015-08-10 DIAGNOSIS — C9110 Chronic lymphocytic leukemia of B-cell type not having achieved remission: Secondary | ICD-10-CM

## 2015-08-13 ENCOUNTER — Other Ambulatory Visit: Payer: 59

## 2015-08-13 ENCOUNTER — Encounter: Payer: Self-pay | Admitting: Oncology

## 2015-08-13 ENCOUNTER — Ambulatory Visit (HOSPITAL_BASED_OUTPATIENT_CLINIC_OR_DEPARTMENT_OTHER): Payer: 59 | Admitting: Oncology

## 2015-08-13 ENCOUNTER — Telehealth: Payer: Self-pay | Admitting: Oncology

## 2015-08-13 ENCOUNTER — Ambulatory Visit: Payer: 59 | Admitting: Oncology

## 2015-08-13 ENCOUNTER — Other Ambulatory Visit (HOSPITAL_BASED_OUTPATIENT_CLINIC_OR_DEPARTMENT_OTHER): Payer: 59

## 2015-08-13 VITALS — BP 161/56 | HR 61 | Temp 97.8°F | Resp 18 | Ht 62.5 in | Wt 214.6 lb

## 2015-08-13 DIAGNOSIS — I10 Essential (primary) hypertension: Secondary | ICD-10-CM

## 2015-08-13 DIAGNOSIS — C9110 Chronic lymphocytic leukemia of B-cell type not having achieved remission: Secondary | ICD-10-CM

## 2015-08-13 DIAGNOSIS — D801 Nonfamilial hypogammaglobulinemia: Secondary | ICD-10-CM

## 2015-08-13 DIAGNOSIS — J3089 Other allergic rhinitis: Secondary | ICD-10-CM

## 2015-08-13 DIAGNOSIS — Z23 Encounter for immunization: Secondary | ICD-10-CM | POA: Diagnosis not present

## 2015-08-13 LAB — COMPREHENSIVE METABOLIC PANEL (CC13)
ALT: 24 U/L (ref 0–55)
AST: 30 U/L (ref 5–34)
Albumin: 4.2 g/dL (ref 3.5–5.0)
Alkaline Phosphatase: 69 U/L (ref 40–150)
Anion Gap: 11 mEq/L (ref 3–11)
BUN: 16.5 mg/dL (ref 7.0–26.0)
CHLORIDE: 102 meq/L (ref 98–109)
CO2: 23 meq/L (ref 22–29)
Calcium: 9.3 mg/dL (ref 8.4–10.4)
Creatinine: 0.9 mg/dL (ref 0.6–1.1)
EGFR: 67 mL/min/{1.73_m2} — ABNORMAL LOW (ref >90)
Glucose: 96 mg/dl (ref 70–140)
Potassium: 3.7 mEq/L (ref 3.5–5.1)
Sodium: 136 mEq/L (ref 136–145)
Total Bilirubin: 0.87 mg/dL (ref 0.20–1.20)
Total Protein: 6.5 g/dL (ref 6.4–8.3)

## 2015-08-13 LAB — CBC WITH DIFFERENTIAL/PLATELET
BASO%: 0.2 % (ref 0.0–2.0)
BASOS ABS: 0.2 10*3/uL — AB (ref 0.0–0.1)
EOS%: 0.5 % (ref 0.0–7.0)
Eosinophils Absolute: 0.4 10*3/uL (ref 0.0–0.5)
HCT: 36.3 % (ref 34.8–46.6)
HGB: 11.9 g/dL (ref 11.6–15.9)
LYMPH%: 89.8 % — ABNORMAL HIGH (ref 14.0–49.7)
MCH: 32 pg (ref 25.1–34.0)
MCHC: 32.8 g/dL (ref 31.5–36.0)
MCV: 97.3 fL (ref 79.5–101.0)
MONO#: 1.1 10*3/uL — ABNORMAL HIGH (ref 0.1–0.9)
MONO%: 1.5 % (ref 0.0–14.0)
NEUT#: 5.5 10*3/uL (ref 1.5–6.5)
NEUT%: 8 % — ABNORMAL LOW (ref 38.4–76.8)
Platelets: 131 10*3/uL — ABNORMAL LOW (ref 145–400)
RBC: 3.74 10*6/uL (ref 3.70–5.45)
RDW: 14.3 % (ref 11.2–14.5)
WBC: 69 10*3/uL (ref 3.9–10.3)
lymph#: 61.9 10*3/uL — ABNORMAL HIGH (ref 0.9–3.3)

## 2015-08-13 MED ORDER — INFLUENZA VAC SPLIT QUAD 0.5 ML IM SUSY
0.5000 mL | PREFILLED_SYRINGE | Freq: Once | INTRAMUSCULAR | Status: AC
  Administered 2015-08-13: 0.5 mL via INTRAMUSCULAR
  Filled 2015-08-13: qty 0.5

## 2015-08-13 MED ORDER — DIPHENHYDRAMINE HCL 25 MG PO TABS
25.0000 mg | ORAL_TABLET | Freq: Once | ORAL | Status: DC
  Filled 2015-08-13: qty 1

## 2015-08-13 MED ORDER — ACETAMINOPHEN 325 MG PO TABS: 650.0000 mg | ORAL_TABLET | Freq: Once | ORAL | Status: DC

## 2015-08-13 MED ORDER — DOXYCYCLINE HYCLATE 100 MG PO TABS: ORAL_TABLET | ORAL | Status: DC

## 2015-08-13 NOTE — Telephone Encounter (Signed)
Gave avs & calnedar for October & November

## 2015-08-13 NOTE — Progress Notes (Signed)
OFFICE PROGRESS NOTE   August 13, 2015   Physicians:(J.Granfortuna, N.Gorsuch), W.McKeown, Shelva Majestic, A.Ross (gyn), F.Constance Goltz  INTERVAL HISTORY:   Patient is seen, alone for visit, in scheduled follow up of CLL, for which she continues observation particularly as she has been reluctant to consider treatment. She did receive IVIG x 4 from Oct 2015 thru March 2016, with no pneumonia or usual multiple upper respiratory infections during the winter.  Since she was here last in March, she has had at least 2 episodes of diarrhea. The first episode of diarrhea may have been related to dexilant, with associated abdominal pain that resolved with protonix. In past week she had diarrhea x 1 day which seemed food related, resolved.  She was treated by Dr Carolynn Sayers for painful stye on right eye in June with doxycycline. She has had slight HA, sinus congestion and drainage and throat discomfort this week, without fever or purulent drainage. She generally has worse environmental allergies late summer, then frequently progresses to sinusitis. She has had no fever, no lower respiratory symptoms, no SOB, no ear pain.   Otherwise no bleeding, energy at baseline, continues to work.   No PAC Flu vaccine given today Prevnar vaccine per Dr Melford Aase  ONCOLOGIC HISTORY Patient was diagnosed with stage I CLL in 11-2008 by biopsy of left axillary lymph node found on routine mammograms. She has not required treatment for the CLL, however does have fluctuations in WBC at times related to intermittent steroids used for recurrent sinusitis and bronchitis. Cytogenetics 06-03-14 at [facility] suggests 13q- (no deletions ATM, CEP 12, 13q34 or p53). She has not had bulky adenopathy, previous anemia or thrombocytopenia. She had had numerous infections, mostly respiratory, with low immune globulins. She began prophylactic IVIG 10-03-14, tolerated adequately at total 20 gm, then dose  increased to 400 mg/kg = 35 gm on 11-14-14.     Review of systems as above, also: No bleeding, no adenopathy. No pain. Appetite good. Remainder of 10 point Review of Systems negative.  Objective:  Vital signs in last 24 hours:  BP 161/56 mmHg  Pulse 61  Temp(Src) 97.8 F (36.6 C) (Oral)  Resp 18  Ht 5' 2.5" (1.588 m)  Wt 214 lb 9.6 oz (97.342 kg)  BMI 38.60 kg/m2 Weight up 5 lbs. Alert, oriented and appropriate. Ambulatory without difficulty.  No alopecia  HEENT:PERRL, sclerae not icteric, no apparent stye now. Oral mucosa moist without lesions, posterior pharynx with dull erythema bilaterally no exudate. TMs clear. Nasal turbinates without purulent drainage Neck supple. No JVD.  Lymphatics:no cervical,supraclavicular adenopathy Resp: clear to auscultation bilaterally and normal percussion bilaterally Cardio: regular rate and rhythm. No gallop. GI: soft, nontender, not distended, no mass or organomegaly. Normally active bowel sounds. Sur Musculoskeletal/ Extremities: without pitting edema, cords, tenderness Neuro:  nonfocal  PSYCH appropriate mood and affect Skin without rash, ecchymosis, petechiae   Lab Results:  Results for orders placed or performed in visit on 08/13/15  CBC with Differential  Result Value Ref Range   WBC 69.0 (HH) 3.9 - 10.3 10e3/uL   NEUT# 5.5 1.5 - 6.5 10e3/uL   HGB 11.9 11.6 - 15.9 g/dL   HCT 36.3 34.8 - 46.6 %   Platelets 131 (L) 145 - 400 10e3/uL   MCV 97.3 79.5 - 101.0 fL   MCH 32.0 25.1 - 34.0 pg   MCHC 32.8 31.5 - 36.0 g/dL   RBC 3.74 3.70 - 5.45 10e6/uL   RDW 14.3 11.2 - 14.5 %  lymph# 61.9 (H) 0.9 - 3.3 10e3/uL   MONO# 1.1 (H) 0.1 - 0.9 10e3/uL   Eosinophils Absolute 0.4 0.0 - 0.5 10e3/uL   Basophils Absolute 0.2 (H) 0.0 - 0.1 10e3/uL   NEUT% 8.0 (L) 38.4 - 76.8 %   LYMPH% 89.8 (H) 14.0 - 49.7 %   MONO% 1.5 0.0 - 14.0 %   EOS% 0.5 0.0 - 7.0 %   BASO% 0.2 0.0 - 2.0 %  Comprehensive metabolic panel (Cmet) - [facility]  Result Value  Ref Range   Sodium 136 136 - 145 mEq/L   Potassium 3.7 3.5 - 5.1 mEq/L   Chloride 102 98 - 109 mEq/L   CO2 23 22 - 29 mEq/L   Glucose 96 70 - 140 mg/dl   BUN 16.5 7.0 - 26.0 mg/dL   Creatinine 0.9 0.6 - 1.1 mg/dL   Total Bilirubin 0.87 0.20 - 1.20 mg/dL   Alkaline Phosphatase 69 40 - 150 U/L   AST 30 5 - 34 U/L   ALT 24 0 - 55 U/L   Total Protein 6.5 6.4 - 8.3 g/dL   Albumin 4.2 3.5 - 5.0 g/dL   Calcium 9.3 8.4 - 10.4 mg/dL   Anion Gap 11 3 - 11 mEq/L   EGFR 67 (L) >90 ml/min/1.73 m2    Quantitative immune globulins available after visit: IgG low at 416, IgA low at 23, IgM low at 14  Studies/Results:  No results found.  Medications: I have reviewed the patient's current medications. Suggested she add mucinex bid and benadryl at hs to usual allegra/ netti pot/ nasocort. Prescription for doxycycline 100 mg bid #4 tablets. GIven flu vaccine today.  DISCUSSION:  Interventions for environmental allergies as above.  Paitent previously had prescription for Cipro which she could begin if infection symptoms. Tho Cipro not first choice now, I am in agreement with doxycycline as above available if she needs to begin while also contacting physician.  Patient had chills following last IVIG in March. If problems recur, will need to reconsider this approach.  Assessment/Plan:  1.CLL with hypogammaglobulinemia: initial CLL diagnosis 11-2008, no other treatment to date. Counts essentially stable and no progression of adenopathy/ no splenomegaly by CT done for other indications. Quantitative immune globulins all low, clinically seemed to benefit with fewer infections last winter when IVIG given. Will resume this ~ Oct, pateint prefers treatments on Friday afternoons.  2.environmental allergies: will try to optimize interventions as above, as she tends to progress to sinusitis when symptoms are severe 3.hepatitis A 4.HTN, elevated cholesterol.  5.GERD 6.obstructive sleep apnea: uses CPAP   7.flu vaccine done today 8.stable mild hypoNa likely from diuretic 9.Deconditioning: she has not begun regular exercise program 10.obesity: BMI 37. Needs to increase exercise.   All questions answered. IVIG orders placed, [facility] financial staff notified for preauthorization.Time spent 25 min including >50% counseling and coordination of care. Cc Dr Birder Robson, MD   08/13/2015, 4:07 PM

## 2015-08-14 LAB — IGG, IGA, IGM
IgA: 23 mg/dL — ABNORMAL LOW (ref 69–380)
IgG (Immunoglobin G), Serum: 416 mg/dL — ABNORMAL LOW (ref 690–1700)
IgM, Serum: 14 mg/dL — ABNORMAL LOW (ref 52–322)

## 2015-08-15 DIAGNOSIS — J3089 Other allergic rhinitis: Secondary | ICD-10-CM | POA: Insufficient documentation

## 2015-09-05 ENCOUNTER — Other Ambulatory Visit: Payer: Self-pay | Admitting: Physician Assistant

## 2015-09-18 ENCOUNTER — Ambulatory Visit (HOSPITAL_BASED_OUTPATIENT_CLINIC_OR_DEPARTMENT_OTHER): Payer: 59

## 2015-09-18 VITALS — BP 118/53 | HR 67 | Temp 97.8°F | Resp 18

## 2015-09-18 DIAGNOSIS — C9110 Chronic lymphocytic leukemia of B-cell type not having achieved remission: Secondary | ICD-10-CM | POA: Diagnosis not present

## 2015-09-18 DIAGNOSIS — D801 Nonfamilial hypogammaglobulinemia: Secondary | ICD-10-CM

## 2015-09-18 MED ORDER — SODIUM CHLORIDE 0.9 % IV SOLN
INTRAVENOUS | Status: DC
  Administered 2015-09-18: 09:00:00 via INTRAVENOUS

## 2015-09-18 MED ORDER — DIPHENHYDRAMINE HCL 25 MG PO CAPS
ORAL_CAPSULE | ORAL | Status: AC
  Filled 2015-09-18: qty 1

## 2015-09-18 MED ORDER — DIPHENHYDRAMINE HCL 25 MG PO CAPS
25.0000 mg | ORAL_CAPSULE | Freq: Once | ORAL | Status: AC
  Administered 2015-09-18: 25 mg via ORAL

## 2015-09-18 MED ORDER — ACETAMINOPHEN 325 MG PO TABS
650.0000 mg | ORAL_TABLET | Freq: Once | ORAL | Status: AC
  Administered 2015-09-18: 650 mg via ORAL

## 2015-09-18 MED ORDER — IMMUNE GLOBULIN (HUMAN) 20 GM/200ML IV SOLN
40.0000 g | Freq: Once | INTRAVENOUS | Status: AC
  Administered 2015-09-18: 40 g via INTRAVENOUS
  Filled 2015-09-18: qty 400

## 2015-09-18 MED ORDER — ACETAMINOPHEN 325 MG PO TABS
ORAL_TABLET | ORAL | Status: AC
  Filled 2015-09-18: qty 2

## 2015-09-18 NOTE — Patient Instructions (Signed)

## 2015-09-23 ENCOUNTER — Encounter: Payer: Self-pay | Admitting: Internal Medicine

## 2015-09-23 ENCOUNTER — Ambulatory Visit (INDEPENDENT_AMBULATORY_CARE_PROVIDER_SITE_OTHER): Payer: 59 | Admitting: Internal Medicine

## 2015-09-23 ENCOUNTER — Other Ambulatory Visit: Payer: Self-pay | Admitting: Internal Medicine

## 2015-09-23 VITALS — BP 132/84 | HR 60 | Temp 97.9°F | Resp 16 | Ht 62.75 in | Wt 212.0 lb

## 2015-09-23 DIAGNOSIS — Z1212 Encounter for screening for malignant neoplasm of rectum: Secondary | ICD-10-CM

## 2015-09-23 DIAGNOSIS — R6889 Other general symptoms and signs: Secondary | ICD-10-CM

## 2015-09-23 DIAGNOSIS — D519 Vitamin B12 deficiency anemia, unspecified: Secondary | ICD-10-CM

## 2015-09-23 DIAGNOSIS — K219 Gastro-esophageal reflux disease without esophagitis: Secondary | ICD-10-CM

## 2015-09-23 DIAGNOSIS — F32A Depression, unspecified: Secondary | ICD-10-CM

## 2015-09-23 DIAGNOSIS — Z6837 Body mass index (BMI) 37.0-37.9, adult: Secondary | ICD-10-CM

## 2015-09-23 DIAGNOSIS — R7303 Prediabetes: Secondary | ICD-10-CM

## 2015-09-23 DIAGNOSIS — C9110 Chronic lymphocytic leukemia of B-cell type not having achieved remission: Secondary | ICD-10-CM

## 2015-09-23 DIAGNOSIS — Z23 Encounter for immunization: Secondary | ICD-10-CM

## 2015-09-23 DIAGNOSIS — I10 Essential (primary) hypertension: Secondary | ICD-10-CM | POA: Diagnosis not present

## 2015-09-23 DIAGNOSIS — Z1389 Encounter for screening for other disorder: Secondary | ICD-10-CM | POA: Diagnosis not present

## 2015-09-23 DIAGNOSIS — Z79899 Other long term (current) drug therapy: Secondary | ICD-10-CM

## 2015-09-23 DIAGNOSIS — E559 Vitamin D deficiency, unspecified: Secondary | ICD-10-CM | POA: Diagnosis not present

## 2015-09-23 DIAGNOSIS — Z789 Other specified health status: Secondary | ICD-10-CM

## 2015-09-23 DIAGNOSIS — G4733 Obstructive sleep apnea (adult) (pediatric): Secondary | ICD-10-CM | POA: Diagnosis not present

## 2015-09-23 DIAGNOSIS — E782 Mixed hyperlipidemia: Secondary | ICD-10-CM | POA: Diagnosis not present

## 2015-09-23 DIAGNOSIS — F329 Major depressive disorder, single episode, unspecified: Secondary | ICD-10-CM | POA: Diagnosis not present

## 2015-09-23 DIAGNOSIS — Z9181 History of falling: Secondary | ICD-10-CM

## 2015-09-23 DIAGNOSIS — Z1331 Encounter for screening for depression: Secondary | ICD-10-CM

## 2015-09-23 DIAGNOSIS — D509 Iron deficiency anemia, unspecified: Secondary | ICD-10-CM

## 2015-09-23 DIAGNOSIS — Z0001 Encounter for general adult medical examination with abnormal findings: Secondary | ICD-10-CM | POA: Diagnosis not present

## 2015-09-23 NOTE — Progress Notes (Signed)
Patient ID: Rebecca Bond, female   DOB: Sep 25, 1946, 69 y.o.   MRN: [id number]  Annual Preventative Visit And  Comprehensive Evaluation,  Examination & Management     This very nice 69 y.o. MWF presents for  presents for a Annual Preventative Visit & comprehensive evaluation and management of multiple medical co-morbidities.  Patient has been followed for HTN, Morbid Obesity, Prediabetes, Hyperlipidemia, and Vitamin D Deficiency.     Patient was dx'd with CLL in 2013 and seen initially by Dr Beryle Beams and currently is followed by Dr Marko Plume.       HTN predates since 1998. Patient's BP has been controlled at home and patient denies any cardiac symptoms as chest pain, palpitations, shortness of breath, dizziness or ankle swelling. Today's BP: 132/84 mmHg      Patient's hyperlipidemia is controlled with diet and medications. Patient denies myalgias or other medication SE's. Last lipids were at goal with Cholesterol 168; HDL 62; LDL 84; Triglycerides 110 on 06/01/2015.     Patient has prediabetes predating since 2012 with an A1c of 6.0% and patient denies reactive hypoglycemic symptoms, visual blurring, diabetic polys, or paresthesias. Last A1c was  6.1% on 06/01/2015.     Finally, patient has history of Vitamin D Deficiency of 30 in 2008 and she has been sporadic in supplementation of Vit D & last Vitamin D was still very low at 73 on 06/01/2015.   Medication Sig  . ALPRAZolam (XANAX) 1 MG tablet TAKE 1/2 TO 1 TABLET BY MOUTH 3 TIMES A DAY AS NEEDED FOR ANXIETY  . Ascorbic Acid (VITAMIN C) 1000 MG tablet Take 1,000 mg by mouth daily.  . bifidobacterium infantis (ALIGN) capsule Take 1 capsule by mouth daily.  [name] Kitchen buPROPion (WELLBUTRIN XL) 150 MG 24 hr tablet TAKE 1 TABLET BY MOUTH DAILY  . Cholecalciferol (VITAMIN D PO) Take 4,000 Units by mouth daily.   [name] Kitchen ezetimibe (ZETIA) 10 MG tablet Take 1 tablet (10 mg total) by mouth at bedtime.  . fexofenadine (ALLEGRA) 180 MG tablet Take 1 tablet (180 mg  total) by mouth daily. Take PRN for allergies.  . hydrochlorothiazide (HYDRODIURIL) 12.5 MG tablet TAKE 1 TABLET BY MOUTH ONCE DAILY  . nebivolol (BYSTOLIC) 10 MG tablet Take 3 tablets (30 mg total) by mouth daily.  [name] Kitchen OVER THE COUNTER MEDICATION Nasocort daily as needed  . OVER THE COUNTER MEDICATION Sustain Ultra Eye drops  . pantoprazole (PROTONIX) 40 MG tablet Take 1 tablet (40 mg total) by mouth daily.  [name] Kitchen doxycycline (VIBRA-TABS) 100 MG tablet Take 1 tablet every 12 hhours  . hyoscyamine (LEVSIN, ANASPAZ) 0.125 MG tablet take 1 to 2 tablets by mouth every 4 hours if needed for nausea and cramping   Allergies  Allergen Reactions  . Hyzaar [Losartan Potassium-Hctz] Other (See Comments)    "messed up my sodium counts" swells up lips.  [name] [Pseudoephedrine Hcl] Palpitations  . Biaxin [Clarithromycin]     GI Upset  . Celexa [Citalopram]   . Flexeril [Cyclobenzaprine]     "zombie-like" feeling  . Fosamax [Alendronate Sodium]     GI upset  . Iohexol Hives     Code: HIVES, Desc: pt states she broke out in hive 20 yrs ago from IV contrast.     . Losartan     Angioedema  . Meloxicam     GI upset  . Norvasc [Amlodipine] Swelling  . Pseudoephedrine     Palpitations  . Zoloft [Sertraline Hcl]    Past Medical History  Diagnosis Date  . Benign essential HTN 11/22/2011  . CLL (chronic lymphocytic leukemia) ([place])   . Pneumonia 01/2010  . History of bronchitis 08/02/2012    "maybe once/yr if that much; last time 01/2010"  . Recurrent sinus infections 08/02/2012  . 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 High cholesterol   . 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 Prediabetes   . Anxiety   . Depression   . GERD (gastroesophageal reflux disease)   . OSA (obstructive sleep apnea)    Health Maintenance  Topic Date Due  . ZOSTAVAX  10/15/2006  . DEXA SCAN  10/16/2011  . MAMMOGRAM  08/25/2015  . INFLUENZA VACCINE  07/05/2016  . TETANUS/TDAP  07/05/2016   . PNA vac Low Risk Adult (2 of 2 - PPSV23) 09/22/2016  . COLONOSCOPY  09/27/2022  . Hepatitis C Screening  Completed   Immunization History  Administered Date(s) Administered  . Influenza,inj,Quad PF,36+ Mos 09/15/2014, 08/13/2015  . Influenza-Unspecified 08/31/2013  . Pneumococcal Conjugate-13 09/23/2015  . Pneumococcal-Unspecified 12/28/2010  . Td 07/05/2006   Past Surgical History  Procedure Laterality Date  . Cholecystectomy  1985  . Tonsillectomy and adenoidectomy  1950's  . Breast surgery    . Lymph node biopsy      "determined I had CLL"  . Temporomandibular joint arthroplasty  1980's  . Functional endoscopic sinus surgery  1990's    "cause I kept having sinus infections"  . Abdominal hysterectomy  1980's    "endometrosis"  . Laparoscopic appendectomy N/A 06/04/2014    Procedure: APPENDECTOMY LAPAROSCOPIC;  Surgeon: Zenovia Jarred, MD;  [facility] OR;  Service: General;  Laterality: N/A;   Family History  Problem Relation Age of Onset  . Parkinson's disease Mother   . Hypertension Mother   . Hypertension Maternal Grandmother   . Heart attack Maternal Grandmother     Mild   Social History  Substance Use Topics  . Smoking status: Former Smoker -- 0.75 packs/day for 4 years    Types: Cigarettes    Quit date: 12/05/1968  . Smokeless tobacco: Never Used  . Alcohol Use: 8.4 oz/week    14 Glasses of wine per week     Comment: 08/02/2012 "couple glasses wine q hs; last time 2-3 wk ago"    ROS Constitutional: Denies fever, chills, weight loss/gain, headaches, insomnia,  night sweats, and change in appetite. Does c/o fatigue. Eyes: Denies redness, blurred vision, diplopia, discharge, itchy, watery eyes.  ENT: Denies discharge, congestion, post nasal drip, epistaxis, sore throat, earache, hearing loss, dental pain, Tinnitus, Vertigo, Sinus pain, snoring.  Cardio: Denies chest pain, palpitations, irregular heartbeat, syncope, dyspnea, diaphoresis, orthopnea, PND,  claudication, edema Respiratory: denies cough, dyspnea, DOE, pleurisy, hoarseness, laryngitis, wheezing.  Gastrointestinal: Denies dysphagia, heartburn, reflux, water brash, pain, cramps, nausea, vomiting, bloating, diarrhea, constipation, hematemesis, melena, hematochezia, jaundice, hemorrhoids Genitourinary: Denies dysuria, frequency, urgency, nocturia, hesitancy, discharge, hematuria, flank pain Breast: Breast lumps, nipple discharge, bleeding.  Musculoskeletal: Denies arthralgia, myalgia, stiffness, Jt. Swelling, pain, limp, and strain/sprain. Denies falls. Skin: Denies puritis, rash, hives, warts, acne, eczema, changing in skin lesion Neuro: No weakness, tremor, incoordination, spasms, paresthesia, pain Psychiatric: Denies confusion, memory loss, sensory loss. Denies Depression. Endocrine: Denies change in weight, skin, hair change, nocturia, and paresthesia, diabetic polys, visual blurring, hyper / hypo glycemic episodes.  Heme/Lymph: No excessive bleeding, bruising, enlarged lymph nodes.  Physical Exam  BP 132/84 mmHg  Pulse 60  Temp(Src) 97.9 F (36.6 C)  Resp 16  Ht 5' 2.75" (1.594 m)  Wt 212 lb (96.163 kg)  BMI 37.85 kg/m2  General Appearance: Well nourished and in no apparent distress. Eyes: PERRLA, EOMs, conjunctiva no swelling or erythema, normal fundi and vessels. Sinuses: No frontal/maxillary tenderness ENT/Mouth: EACs patent / TMs  nl. Nares clear without erythema, swelling, mucoid exudates. Oral hygiene is good. No erythema, swelling, or exudate. Tongue normal, non-obstructing. Tonsils not swollen or erythematous. Hearing normal.  Neck: Supple, thyroid normal. No bruits, nodes or JVD. Respiratory: Respiratory effort normal.  BS equal and clear bilateral without rales, rhonci, wheezing or stridor. Cardio: Heart sounds are normal with regular rate and rhythm and no murmurs, rubs or gallops. Peripheral pulses are normal and equal bilaterally without edema. No aortic or  femoral bruits. Chest: symmetric with normal excursions and percussion. Breasts: Symmetric, without lumps, nipple discharge, retractions, or fibrocystic changes.  Abdomen: Flat, soft, with bowel sounds. Nontender, no guarding, rebound, hernias, masses, or organomegaly.  Lymphatics: Non tender without lymphadenopathy.  Musculoskeletal: Full ROM all peripheral extremities, joint stability, 5/5 strength, and normal gait. Skin: Warm and dry without rashes, lesions, cyanosis, clubbing or  ecchymosis.  Neuro: Cranial nerves intact, reflexes equal bilaterally. Normal muscle tone, no cerebellar symptoms. Sensation intact.  Pysch: Alert and oriented X 3, normal affect, Insight and Judgment appropriate.   Assessment and Plan  1. Encounter for general adult medical examination with abnormal findings  - Microalbumin / creatinine urine ratio - EKG 12-Lead - [place], RETROPERITNL ABD,  LTD - POC Hemoccult Bld/Stl ( - Vitamin B12 - Iron and TIBC - Uric acid - Urinalysis, Routine w reflex microscopic  - CBC with Differential/Platelet - BASIC METABOLIC PANEL WITH GFR - Hepatic function panel - Magnesium - Lipid panel - TSH - Hemoglobin A1c - Insulin, random - Vit D  25 hydroxy   2. Essential hypertension  - Microalbumin / creatinine urine ratio - EKG 12-Lead - [place], RETROPERITNL ABD,  LTD - POC Hemoccult Bld/Stl  - TSH  3. Mixed hyperlipidemia  - Lipid panel  4. Prediabetes  - Hemoglobin A1c - Insulin, random  5. Vitamin D deficiency  - Vit D  25 hydroxy   6. Gastroesophageal reflux disease   7. OSA (obstructive sleep apnea)   8. CLL (chronic lymphocytic leukemia) ([place])   9. Morbid obesity ([place])   10. Screening for rectal cancer   11. BMI 37.85,   adult   12. Depression screen   13. At low risk for fall   14. B12 deficiency anemia  - Vitamin B12  15. Anemia, iron deficiency  - Iron and TIBC  16. Need for prophylactic vaccination against Streptococcus  pneumoniae (pneumococcus)  - Pneumococcal conjugate vaccine 13-valent  17. Medication management  - Uric acid - Urinalysis, Routine w reflex microscopic  - CBC with Differential/Platelet - BASIC METABOLIC PANEL WITH GFR - Hepatic function panel - Magnesium  18. Depression, controlled   Continue prudent diet as discussed, weight control, BP monitoring, regular exercise, and medications. Discussed med's effects and SE's. Screening labs and tests as requested with regular follow-up as recommended.  Over 40 minutes of exam, counseling, chart review was performed.

## 2015-09-23 NOTE — Patient Instructions (Signed)

## 2015-09-24 ENCOUNTER — Other Ambulatory Visit: Payer: Self-pay | Admitting: Internal Medicine

## 2015-09-24 ENCOUNTER — Encounter: Payer: Self-pay | Admitting: Internal Medicine

## 2015-09-24 DIAGNOSIS — E790 Hyperuricemia without signs of inflammatory arthritis and tophaceous disease: Secondary | ICD-10-CM

## 2015-09-24 LAB — URINALYSIS, ROUTINE W REFLEX MICROSCOPIC
BILIRUBIN URINE: NEGATIVE
GLUCOSE, UA: NEGATIVE
HGB URINE DIPSTICK: NEGATIVE
KETONES UR: NEGATIVE
NITRITE: NEGATIVE
PH: 6 (ref 5.0–8.0)
Protein, ur: NEGATIVE
Specific Gravity, Urine: 1.015 (ref 1.001–1.035)

## 2015-09-24 LAB — URINALYSIS, MICROSCOPIC ONLY
BACTERIA UA: NONE SEEN [HPF]
CRYSTALS: NONE SEEN [HPF]
Casts: NONE SEEN [LPF]
RBC / HPF: NONE SEEN RBC/HPF (ref <=2)
Yeast: NONE SEEN [HPF]

## 2015-09-24 LAB — BASIC METABOLIC PANEL WITH GFR
BUN: 18 mg/dL (ref 7–25)
CHLORIDE: 94 mmol/L — AB (ref 98–110)
CO2: 25 mmol/L (ref 20–31)
Calcium: 9.5 mg/dL (ref 8.6–10.4)
Creat: 0.8 mg/dL (ref 0.50–0.99)
GFR, EST AFRICAN AMERICAN: 88 mL/min (ref >=60)
GFR, EST NON AFRICAN AMERICAN: 76 mL/min (ref >=60)
GLUCOSE: 93 mg/dL (ref 65–99)
POTASSIUM: 4 mmol/L (ref 3.5–5.3)
Sodium: 129 mmol/L — ABNORMAL LOW (ref 135–146)

## 2015-09-24 LAB — CBC WITH DIFFERENTIAL/PLATELET
Basophils Absolute: 0 10*3/uL (ref 0.0–0.1)
Basophils Relative: 0 % (ref 0–1)
EOS PCT: 0 % (ref 0–5)
Eosinophils Absolute: 0 10*3/uL (ref 0.0–0.7)
HEMATOCRIT: 34.4 % — AB (ref 36.0–46.0)
Hemoglobin: 11.6 g/dL — ABNORMAL LOW (ref 12.0–15.0)
LYMPHS ABS: 60.3 10*3/uL — AB (ref 0.7–4.0)
LYMPHS PCT: 91 % — AB (ref 12–46)
MCH: 31.7 pg (ref 26.0–34.0)
MCHC: 33.7 g/dL (ref 30.0–36.0)
MCV: 94 fL (ref 78.0–100.0)
MPV: 8.3 fL — ABNORMAL LOW (ref 8.6–12.4)
Monocytes Absolute: 1.3 10*3/uL — ABNORMAL HIGH (ref 0.1–1.0)
Monocytes Relative: 2 % — ABNORMAL LOW (ref 3–12)
Neutro Abs: 4.6 10*3/uL (ref 1.7–7.7)
Neutrophils Relative %: 7 % — ABNORMAL LOW (ref 43–77)
Platelets: 122 10*3/uL — ABNORMAL LOW (ref 150–400)
RBC: 3.66 MIL/uL — AB (ref 3.87–5.11)
RDW: 14.2 % (ref 11.5–15.5)
WBC: 66.3 10*3/uL — ABNORMAL HIGH (ref 4.0–10.5)

## 2015-09-24 LAB — IRON AND TIBC
%SAT: 23 % (ref 11–50)
IRON: 100 ug/dL (ref 45–160)
TIBC: 432 ug/dL (ref 250–450)
UIBC: 332 ug/dL (ref 125–400)

## 2015-09-24 LAB — MICROALBUMIN / CREATININE URINE RATIO
CREATININE, URINE: 105 mg/dL (ref 20–320)
MICROALB UR: 2.9 mg/dL
MICROALB/CREAT RATIO: 28 ug/mg{creat} (ref <30)

## 2015-09-24 LAB — HEMOGLOBIN A1C
Hgb A1c MFr Bld: 5.9 % — ABNORMAL HIGH (ref <5.7)
Mean Plasma Glucose: 123 mg/dL — ABNORMAL HIGH (ref <117)

## 2015-09-24 LAB — LIPID PANEL
Cholesterol: 191 mg/dL (ref 125–200)
HDL: 53 mg/dL (ref >=46)
LDL CALC: 110 mg/dL (ref <130)
TRIGLYCERIDES: 139 mg/dL (ref <150)
Total CHOL/HDL Ratio: 3.6 Ratio (ref <=5.0)
VLDL: 28 mg/dL (ref <30)

## 2015-09-24 LAB — MAGNESIUM: Magnesium: 1.5 mg/dL (ref 1.5–2.5)

## 2015-09-24 LAB — URIC ACID: Uric Acid, Serum: 7.4 mg/dL — ABNORMAL HIGH (ref 2.4–7.0)

## 2015-09-24 LAB — HEPATIC FUNCTION PANEL
ALK PHOS: 62 U/L (ref 33–130)
ALT: 27 U/L (ref 6–29)
AST: 36 U/L — AB (ref 10–35)
Albumin: 4.7 g/dL (ref 3.6–5.1)
BILIRUBIN INDIRECT: 0.4 mg/dL (ref 0.2–1.2)
BILIRUBIN TOTAL: 0.6 mg/dL (ref 0.2–1.2)
Bilirubin, Direct: 0.2 mg/dL (ref <=0.2)
TOTAL PROTEIN: 7.4 g/dL (ref 6.1–8.1)

## 2015-09-24 LAB — INSULIN, RANDOM: Insulin: 19.6 u[IU]/mL (ref 2.0–19.6)

## 2015-09-24 LAB — TSH: TSH: 3.593 u[IU]/mL (ref 0.350–4.500)

## 2015-09-24 LAB — VITAMIN D 25 HYDROXY (VIT D DEFICIENCY, FRACTURES): Vit D, 25-Hydroxy: 47 ng/mL (ref 30–100)

## 2015-09-24 LAB — VITAMIN B12: VITAMIN B 12: 278 pg/mL (ref 211–911)

## 2015-09-24 MED ORDER — BUPROPION HCL ER (XL) 300 MG PO TB24
ORAL_TABLET | ORAL | Status: DC
Start: 2015-09-24 — End: 2016-08-12

## 2015-09-24 MED ORDER — ALLOPURINOL 300 MG PO TABS: ORAL_TABLET | ORAL | Status: DC

## 2015-09-25 LAB — URINE CULTURE

## 2015-10-03 ENCOUNTER — Other Ambulatory Visit: Payer: Self-pay | Admitting: Internal Medicine

## 2015-10-04 ENCOUNTER — Other Ambulatory Visit: Payer: Self-pay | Admitting: Oncology

## 2015-10-04 DIAGNOSIS — C9110 Chronic lymphocytic leukemia of B-cell type not having achieved remission: Secondary | ICD-10-CM

## 2015-10-08 ENCOUNTER — Other Ambulatory Visit (HOSPITAL_BASED_OUTPATIENT_CLINIC_OR_DEPARTMENT_OTHER): Payer: 59

## 2015-10-08 ENCOUNTER — Telehealth: Payer: Self-pay | Admitting: Oncology

## 2015-10-08 ENCOUNTER — Encounter: Payer: Self-pay | Admitting: Oncology

## 2015-10-08 ENCOUNTER — Ambulatory Visit (HOSPITAL_BASED_OUTPATIENT_CLINIC_OR_DEPARTMENT_OTHER): Payer: 59 | Admitting: Oncology

## 2015-10-08 VITALS — BP 165/65 | HR 70 | Temp 98.1°F | Resp 18 | Ht 62.75 in | Wt 205.4 lb

## 2015-10-08 DIAGNOSIS — C9110 Chronic lymphocytic leukemia of B-cell type not having achieved remission: Secondary | ICD-10-CM

## 2015-10-08 DIAGNOSIS — R300 Dysuria: Secondary | ICD-10-CM

## 2015-10-08 DIAGNOSIS — D849 Immunodeficiency, unspecified: Secondary | ICD-10-CM | POA: Diagnosis not present

## 2015-10-08 DIAGNOSIS — D899 Disorder involving the immune mechanism, unspecified: Secondary | ICD-10-CM

## 2015-10-08 LAB — CBC WITH DIFFERENTIAL/PLATELET
BASO%: 0.2 % (ref 0.0–2.0)
Basophils Absolute: 0.1 10*3/uL (ref 0.0–0.1)
EOS%: 0.6 % (ref 0.0–7.0)
Eosinophils Absolute: 0.3 10*3/uL (ref 0.0–0.5)
HCT: 36.9 % (ref 34.8–46.6)
HGB: 12 g/dL (ref 11.6–15.9)
LYMPH%: 86.3 % — ABNORMAL HIGH (ref 14.0–49.7)
MCH: 31.2 pg (ref 25.1–34.0)
MCHC: 32.4 g/dL (ref 31.5–36.0)
MCV: 96.3 fL (ref 79.5–101.0)
MONO#: 1.3 10*3/uL — ABNORMAL HIGH (ref 0.1–0.9)
MONO%: 2.2 % (ref 0.0–14.0)
NEUT#: 6.1 10*3/uL (ref 1.5–6.5)
NEUT%: 10.7 % — ABNORMAL LOW (ref 38.4–76.8)
Platelets: 129 10*3/uL — ABNORMAL LOW (ref 145–400)
RBC: 3.83 10*6/uL (ref 3.70–5.45)
RDW: 13.9 % (ref 11.2–14.5)
WBC: 57.2 10*3/uL (ref 3.9–10.3)
lymph#: 49.4 10*3/uL — ABNORMAL HIGH (ref 0.9–3.3)

## 2015-10-08 LAB — URINALYSIS, MICROSCOPIC - CHCC
Bilirubin (Urine): NEGATIVE
Blood: NEGATIVE
GLUCOSE UR CHCC: NEGATIVE mg/dL
Ketones: NEGATIVE mg/dL
Nitrite: NEGATIVE
PROTEIN: NEGATIVE mg/dL
SPECIFIC GRAVITY, URINE: 1.01 (ref 1.003–1.035)
UROBILINOGEN UR: 0.2 mg/dL (ref 0.2–1)
pH: 6 (ref 4.6–8.0)

## 2015-10-08 LAB — COMPREHENSIVE METABOLIC PANEL (CC13)
ALT: 25 U/L (ref 0–55)
AST: 28 U/L (ref 5–34)
Albumin: 4.4 g/dL (ref 3.5–5.0)
Alkaline Phosphatase: 62 U/L (ref 40–150)
Anion Gap: 10 mEq/L (ref 3–11)
BUN: 13.7 mg/dL (ref 7.0–26.0)
CO2: 23 mEq/L (ref 22–29)
Calcium: 9.9 mg/dL (ref 8.4–10.4)
Chloride: 100 mEq/L (ref 98–109)
Creatinine: 0.9 mg/dL (ref 0.6–1.1)
EGFR: 65 mL/min/{1.73_m2} — ABNORMAL LOW (ref >90)
Glucose: 100 mg/dl (ref 70–140)
Potassium: 3.9 mEq/L (ref 3.5–5.1)
Sodium: 133 mEq/L — ABNORMAL LOW (ref 136–145)
Total Bilirubin: 0.63 mg/dL (ref 0.20–1.20)
Total Protein: 7.1 g/dL (ref 6.4–8.3)

## 2015-10-08 LAB — TECHNOLOGIST REVIEW

## 2015-10-08 NOTE — Telephone Encounter (Signed)
Appointments made and avs printed for patient,pateint DECLINES to schedule 12/25/15 ivig at this time due to eye surg and will talk with dr Marko Plume about this

## 2015-10-08 NOTE — Progress Notes (Signed)
OFFICE PROGRESS NOTE   October 08, 2015   Physicians:J.Granfortuna, N.Gorsuch), W.McKeown, Shelva Majestic, A.Ross (gyn), F.Constance Goltz  INTERVAL HISTORY:  Patient is seen, alone for visit, in scheduled follow up of CLL, for which she is on observation, and prophylactic IVIG resumed 09-18-15 due to history of recurrent respiratory infections. She tolerated IVG on 09-18-15 without difficulty.  Patient has had several days of worsening urinary frequency, voiding every 20 min last night. She has had no fever or chills. Gyn office called in [place], which she has not begun; urine specimen obtained now and she will start macrobid as we follow up results of culture.  She has had no infections otherwise since she was here last. She is watching portion sizes and cutting back on sweets and "white foods", with intentional weight loss. She denies bleeding, upper or lower respiratory symptoms, increased fatigue. Peripheral IV access was adequate for the IVIG. Review of systems as above, remainder of 10 point Review of Systems negative/ unchanged.   No PAC Flu vaccine 08-13-15 Prevnar vaccine per Dr Melford Aase  ONCOLOGIC HISTORY Patient was diagnosed with stage I CLL in 11-2008 by biopsy of left axillary lymph node found on routine mammograms. She has not required treatment for the CLL, however does have fluctuations in WBC at times related to intermittent steroids used for recurrent sinusitis and bronchitis. Cytogenetics 06-03-14 at [facility] suggests 13q- (no deletions ATM, CEP 12, 13q34 or p53). She has not had bulky adenopathy, previous anemia or thrombocytopenia. She had had numerous infections, mostly respiratory, with low immune globulins. She began prophylactic IVIG 10-03-14, tolerated adequately at total 20 gm, then dose increased to 400 mg/kg = 35 gm on 11-14-14.       Objective:  Vital signs in last 24 hours:  BP 165/65 mmHg  Pulse 70  Temp(Src) 98.1 F  (36.7 C) (Oral)  Resp 18  Ht 5' 2.75" (1.594 m)  Wt 205 lb 6.4 oz (93.169 kg)  BMI 36.67 kg/m2  SpO2 100% Weight down 8.5 lbs, intentional Alert, oriented and appropriate. Ambulatory without difficulty, looks comfortable.  HEENT:PERRL, sclerae not icteric. Oral mucosa moist without lesions, posterior pharynx clear.  Neck supple. No JVD.  Lymphatics: bilateral cervical nodes soft, up to ~ 2 cm, not tender. Resp: clear to auscultation bilaterally and normal percussion bilaterally Cardio: regular rate and rhythm. No gallop. JG:OTLXBWI obese, soft, nontender. Normally active bowel sounds.  Musculoskeletal/ Extremities: without pitting edema, cords, tenderness Neuro: nonfocal  PSYCH appropriate mood and affect Skin without rash, ecchymosis, petechiae   Lab Results:  Results for orders placed or performed in visit on 10/08/15  TECHNOLOGIST REVIEW  Result Value Ref Range   Technologist Review Variant lymphs present. smudge cells present.   CBC with Differential  Result Value Ref Range   WBC 57.2 (HH) 3.9 - 10.3 10e3/uL   NEUT# 6.1 1.5 - 6.5 10e3/uL   HGB 12.0 11.6 - 15.9 g/dL   HCT 36.9 34.8 - 46.6 %   Platelets 129 (L) 145 - 400 10e3/uL   MCV 96.3 79.5 - 101.0 fL   MCH 31.2 25.1 - 34.0 pg   MCHC 32.4 31.5 - 36.0 g/dL   RBC 3.83 3.70 - 5.45 10e6/uL   RDW 13.9 11.2 - 14.5 %   lymph# 49.4 (H) 0.9 - 3.3 10e3/uL   MONO# 1.3 (H) 0.1 - 0.9 10e3/uL   Eosinophils Absolute 0.3 0.0 - 0.5 10e3/uL   Basophils Absolute 0.1 0.0 - 0.1 10e3/uL   NEUT%  10.7 (L) 38.4 - 76.8 %   LYMPH% 86.3 (H) 14.0 - 49.7 %   MONO% 2.2 0.0 - 14.0 %   EOS% 0.6 0.0 - 7.0 %   BASO% 0.2 0.0 - 2.0 %  Comprehensive metabolic panel (Cmet) - [facility]  Result Value Ref Range   Sodium 133 (L) 136 - 145 mEq/L   Potassium 3.9 3.5 - 5.1 mEq/L   Chloride 100 98 - 109 mEq/L   CO2 23 22 - 29 mEq/L   Glucose 100 70 - 140 mg/dl   BUN 13.7 7.0 - 26.0 mg/dL   Creatinine 0.9 0.6 - 1.1 mg/dL   Total Bilirubin 0.63 0.20 - 1.20  mg/dL   Alkaline Phosphatase 62 40 - 150 U/L   AST 28 5 - 34 U/L   ALT 25 0 - 55 U/L   Total Protein 7.1 6.4 - 8.3 g/dL   Albumin 4.4 3.5 - 5.0 g/dL   Calcium 9.9 8.4 - 10.4 mg/dL   Anion Gap 10 3 - 11 mEq/L   EGFR 65 (L) >90 ml/min/1.73 m2   UA C&S sent and pending  Studies/Results:  No results found.  Medications: I have reviewed the patient's current medications.  DISCUSSION: patient understands that she should begin macrobid today and that we will follow up with her when results of urine culture available She would like to continue IVIG, which we will use ~ every 6 weeks thru winter.  Assessment/Plan:  1.CLL with hypogammaglobulinemia: initial CLL diagnosis 11-2008, no other treatment to date. Counts essentially stable and no progression of adenopathy/ no splenomegaly by CT 02-2015. Quantitative immune globulins all low, clinically seemed to benefit with fewer infections last winter when IVIG given. Will give IVIG again on 11-06-15, coordinating with her work schedule. 2.urinary frequency: possible UTI, culture sent and will start antibiotics as above. 3.hepatitis A 4.HTN, elevated cholesterol.  5.GERD 6.obstructive sleep apnea: uses CPAP  7.flu vaccine 08-13-15 8.stable mild hypoNa likely from diuretic 9.Deconditioning: she has not begun regular exercise program 10.obesity: BMI 37. Needs to increase exercise, but has made progress with dietary interventions in last several weeks  All questions answered. IVIG orders placed. Time spent 25 min including >50% counseling and coordination of care.     Keren Alverio P, MD   10/08/2015, 4:07 PM

## 2015-10-09 LAB — URINE CULTURE

## 2015-10-10 DIAGNOSIS — D849 Immunodeficiency, unspecified: Secondary | ICD-10-CM | POA: Insufficient documentation

## 2015-10-10 DIAGNOSIS — D899 Disorder involving the immune mechanism, unspecified: Secondary | ICD-10-CM

## 2015-10-12 ENCOUNTER — Telehealth: Payer: Self-pay

## 2015-10-12 ENCOUNTER — Encounter: Payer: Self-pay | Admitting: Internal Medicine

## 2015-10-12 NOTE — Telephone Encounter (Signed)
Told Rebecca Bond that the urine culture was negative for a UTI as noted below by Dr. Marko Plume.  Rebecca Bond will stop the Macrobid.

## 2015-10-12 NOTE — Telephone Encounter (Signed)
-----   Message from Gordy Levan, MD sent at 10/12/2015  7:28 AM EST ----- Let her know no UTI on culture. Stop Macrobid

## 2015-10-27 ENCOUNTER — Ambulatory Visit (INDEPENDENT_AMBULATORY_CARE_PROVIDER_SITE_OTHER): Payer: 59 | Admitting: Physician Assistant

## 2015-10-27 ENCOUNTER — Encounter: Payer: Self-pay | Admitting: Physician Assistant

## 2015-10-27 ENCOUNTER — Telehealth: Payer: Self-pay | Admitting: Gastroenterology

## 2015-10-27 ENCOUNTER — Ambulatory Visit: Payer: Self-pay | Admitting: Physician Assistant

## 2015-10-27 VITALS — BP 140/100 | HR 64 | Temp 97.9°F | Resp 14 | Ht 62.5 in | Wt 206.0 lb

## 2015-10-27 DIAGNOSIS — R1084 Generalized abdominal pain: Secondary | ICD-10-CM | POA: Diagnosis not present

## 2015-10-27 DIAGNOSIS — K219 Gastro-esophageal reflux disease without esophagitis: Secondary | ICD-10-CM | POA: Diagnosis not present

## 2015-10-27 DIAGNOSIS — R110 Nausea: Secondary | ICD-10-CM

## 2015-10-27 DIAGNOSIS — R197 Diarrhea, unspecified: Secondary | ICD-10-CM | POA: Diagnosis not present

## 2015-10-27 LAB — BASIC METABOLIC PANEL WITH GFR
BUN: 7 mg/dL (ref 7–25)
CALCIUM: 9.2 mg/dL (ref 8.6–10.4)
CO2: 23 mmol/L (ref 20–31)
CREATININE: 0.71 mg/dL (ref 0.50–0.99)
Chloride: 101 mmol/L (ref 98–110)
GFR, Est Non African American: 87 mL/min (ref >=60)
Glucose, Bld: 87 mg/dL (ref 65–99)
Potassium: 3.9 mmol/L (ref 3.5–5.3)
SODIUM: 136 mmol/L (ref 135–146)

## 2015-10-27 LAB — AMYLASE: AMYLASE: 30 U/L (ref 0–105)

## 2015-10-27 LAB — HEPATIC FUNCTION PANEL
ALT: 32 U/L — AB (ref 6–29)
AST: 34 U/L (ref 10–35)
Albumin: 4.1 g/dL (ref 3.6–5.1)
Alkaline Phosphatase: 63 U/L (ref 33–130)
BILIRUBIN DIRECT: 0.2 mg/dL (ref <=0.2)
Indirect Bilirubin: 0.5 mg/dL (ref 0.2–1.2)
TOTAL PROTEIN: 6.5 g/dL (ref 6.1–8.1)
Total Bilirubin: 0.7 mg/dL (ref 0.2–1.2)

## 2015-10-27 MED ORDER — ONDANSETRON HCL 4 MG PO TABS: 4.0000 mg | ORAL_TABLET | Freq: Every day | ORAL | Status: DC | PRN

## 2015-10-27 MED ORDER — ELUXADOLINE 75 MG PO TABS
75.0000 mg | ORAL_TABLET | Freq: Two times a day (BID) | ORAL | Status: DC
Start: 2015-10-27 — End: 2015-12-21

## 2015-10-27 NOTE — Progress Notes (Signed)
   Subjective:    Patient ID: Rebecca Bond, female    DOB: [birth year], 69 y.o.   MRN: [id number]  HPI 69 y.o. WF with history of CLL, HTN, preDM, chol, obesity, s/p choley presents with diarrhea, nausea x Thursday, most symptoms improved until yesterday. She had diarrhea yesterday, had had 3 imodium yesterday that helped, now just has nausea, epigastric discomfort, bloating. Denies fever, chills, black stool/blood in stool.  She took levsin and went to bed which helped but she states it makes her too sleepy. She is on levsin, pantroprazole, and align. Has not been eating much and feels dehydrated.  Had normal CT AB 02/04/2015. She denies recent NSAID use. No recent travel. No recent ABX use. Does drink 1 glass of wine several nights a week.  Blood pressure 140/100, pulse 64, temperature 97.9 F (36.6 C), temperature source Temporal, resp. rate 14, height 5' 2.5" (1.588 m), weight 206 lb (93.441 kg), SpO2 96 %.   Review of Systems  Constitutional: Negative for fever, chills, diaphoresis and fatigue.  HENT: Negative.   Respiratory: Negative.  Negative for cough.   Cardiovascular: Negative.   Gastrointestinal: Positive for nausea, abdominal pain, diarrhea, constipation and abdominal distention. Negative for vomiting, blood in stool, anal bleeding and rectal pain.       + GERD  Genitourinary: Negative for dysuria, urgency, frequency, hematuria, flank pain, difficulty urinating and pelvic pain.  Musculoskeletal: Positive for back pain. Negative for myalgias, joint swelling, arthralgias, gait problem, neck pain and neck stiffness.  Skin: Negative.   Neurological: Negative for dizziness and headaches.       Objective:   Physical Exam  Constitutional: She is oriented to person, place, and time. She appears well-developed and well-nourished. No distress.  Cardiovascular: Normal rate and regular rhythm.   No murmur heard. Pulmonary/Chest: Effort normal and breath sounds normal. She has no  wheezes.  Abdominal: Soft. She exhibits no distension and no mass. There is tenderness (epigastric ). There is no rebound and no guarding.  Musculoskeletal: Normal range of motion. She exhibits no tenderness.  No CVA tenderness  Neurological: She is alert and oriented to person, place, and time.  Skin: Skin is warm and dry. No rash noted.       Assessment & Plan:  Gastritis/duodenitis rule out pancreatitis/check for dehydration ? Versus IBS- she does not want bentyl due to consipation risk, will give samples of viberzi Take protonix x 1 month, zantac at night [place] diet reviewed Get on floor store If not better 2 weeks will refer GI for EGD  Future Appointments Date Time Provider [place]  11/06/2015 9:00 AM [facility] F18 [facility] None  11/10/2015 8:30 AM [facility] NURSE [facility] None  12/21/2015 3:30 PM [facility] LAB 5 [facility] None  12/21/2015 4:00 PM Lennis P Livesay, MD [facility] None  12/25/2015 9:00 AM [facility] D13 [facility] None  01/12/2016 4:30 PM Vicie Mutters, PA-C [facility] None  04/18/2016 4:30 PM [name], MD [facility] None  10/20/2016 3:00 PM [name], MD [facility] None

## 2015-10-27 NOTE — Telephone Encounter (Signed)
Patient did get an appointment with her PCP. She has been evaluated and treated. No interventions from GI.

## 2015-10-27 NOTE — Telephone Encounter (Signed)
Patient calls with complaints of nausea. She had a spell of nausea and diarrhea last week that lasted for 3 days. It has resolved somewhat with the only remaining symptom being extreme nausea. She does report a sensation of her abdomen feeling crampy and bloated. She has taken Levsin 0.125mg  with some relief. She again mentions she is nauseated. She cannot bear the thought of food. She is retaining PO fluids. Denies fever, chills or body aches. She has not had any dark stools or seen any blood. She has not had any vomiting. She takes her Protonix daily. Can we give her Zofran? I have encouraged her to contact her PCP. No appointment openings here for 2 weeks.

## 2015-10-27 NOTE — Telephone Encounter (Signed)
Yes she can have Zofran 4 mg q 6-8 hours prn #40/0, may also want to get OTC pepcid and take 20 mg once or twice daily , and should  see PCP

## 2015-10-27 NOTE — Patient Instructions (Addendum)
Take protonix/pantroprazole x 1 month, take zantac 150mg  at night, Bland diet reviewed Get on floorstor If not better 2 weeks will refer GI for EGD  Irritable Bowel Syndrome, Adult Irritable bowel syndrome (IBS) is not one specific disease. It is a group of symptoms that affects the organs responsible for digestion (gastrointestinal or GI tract).  To regulate how your GI tract works, your body sends signals back and forth between your intestines and your brain. If you have IBS, there may be a problem with these signals. As a result, your GI tract does not function normally. Your intestines may become more sensitive and overreact to certain things. This is especially true when you eat certain foods or when you are under stress.  There are four types of IBS. These may be determined based on the consistency of your stool:   IBS with diarrhea.   IBS with constipation.   Mixed IBS.   Unsubtyped IBS.  It is important to know which type of IBS you have. Some treatments are more likely to be helpful for certain types of IBS.  CAUSES  The exact cause of IBS is not known. RISK FACTORS You may have a higher risk of IBS if:  You are a woman.  You are younger than 69 years old.  You have a family history of IBS.  You have mental health problems.  You have had bacterial infection of your GI tract. SIGNS AND SYMPTOMS  Symptoms of IBS vary from person to person. The main symptom is abdominal pain or discomfort. Additional symptoms usually include one or more of the following:   Diarrhea, constipation, or both.   Abdominal swelling or bloating.   Feeling full or sick after eating a small or regular-size meal.   Frequent gas.   Mucus in the stool.   A feeling of having more stool left after a bowel movement.  Symptoms tend to come and go. They may be associated with stress, psychiatric conditions, or nothing at all.  DIAGNOSIS  There is no specific test to diagnose IBS. Your  health care provider will make a diagnosis based on a physical exam, medical history, and your symptoms. You may have other tests to rule out other conditions that may be causing your symptoms. These may include:   Blood tests.   X-rays.   CT scan.  Endoscopy and colonoscopy. This is a test in which your GI tract is viewed with a long, thin, flexible tube. TREATMENT There is no cure for IBS, but treatment can help relieve symptoms. IBS treatment often includes:   Changes to your diet, such as:  Eating more fiber.  Avoiding foods that cause symptoms.  Drinking more water.  Eating regular, medium-sized portioned meals.  Medicines. These may include:  Fiber supplements if you have constipation.  Medicine to control diarrhea (antidiarrheal medicines).  Medicine to help control muscle spasms in your GI tract (antispasmodic medicines).  Medicines to help with any mental health issues, such as antidepressants or tranquilizers.  Therapy.  Talk therapy may help with anxiety, depression, or other mental health issues that can make IBS symptoms worse.  Stress reduction.  Managing your stress can help keep symptoms under control. HOME CARE INSTRUCTIONS   Take medicines only as directed by your health care provider.  Eat a healthy diet.  Avoid foods and drinks with added sugar.  Include more whole grains, fruits, and vegetables gradually into your diet. This may be especially helpful if you have IBS with constipation.  Avoid any foods and drinks that make your symptoms worse. These may include dairy products and caffeinated or carbonated drinks.  Do not eat large meals.  Drink enough fluid to keep your urine clear or pale yellow.  Exercise regularly. Ask your health care provider for recommendations of good activities for you.  Keep all follow-up visits as directed by your health care provider. This is important. SEEK MEDICAL CARE IF:   You have constant pain.  You  have trouble or pain with swallowing.  You have worsening diarrhea. SEEK IMMEDIATE MEDICAL CARE IF:   You have severe and worsening abdominal pain.   You have diarrhea and:   You have a rash, stiff neck, or severe headache.   You are irritable, sleepy, or difficult to awaken.   You are weak, dizzy, or extremely thirsty.   You have bright red blood in your stool or you have black tarry stools.   You have unusual abdominal swelling that is painful.   You vomit continuously.   You vomit blood (hematemesis).   You have both abdominal pain and a fever.    This information is not intended to replace advice given to you by your health care provider. Make sure you discuss any questions you have with your health care provider.   Document Released: 11/21/2005 Document Revised: 12/12/2014 Document Reviewed: 08/08/2014 Elsevier Interactive Patient Education 2016 [place] for Gastroesophageal Reflux Disease, Adult When you have gastroesophageal reflux disease (GERD), the foods you eat and your eating habits are very important. Choosing the right foods can help ease the discomfort of GERD. WHAT GENERAL GUIDELINES DO I NEED TO FOLLOW?  Choose fruits, vegetables, whole grains, low-fat dairy products, and low-fat meat, fish, and poultry.  Limit fats such as oils, salad dressings, butter, nuts, and avocado.  Keep a food diary to identify foods that cause symptoms.  Avoid foods that cause reflux. These may be different for different people.  Eat frequent small meals instead of three large meals each day.  Eat your meals slowly, in a relaxed setting.  Limit fried foods.  Cook foods using methods other than frying.  Avoid drinking alcohol.  Avoid drinking large amounts of liquids with your meals.  Avoid bending over or lying down until 2-3 hours after eating. WHAT FOODS ARE NOT RECOMMENDED? The following are some foods and drinks that may worsen  your symptoms: Vegetables Tomatoes. Tomato juice. Tomato and spaghetti sauce. Chili peppers. Onion and garlic. Horseradish. Fruits Oranges, grapefruit, and lemon (fruit and juice). Meats High-fat meats, fish, and poultry. This includes hot dogs, ribs, ham, sausage, salami, and bacon. Dairy Whole milk and chocolate milk. Sour cream. Cream. Butter. Ice cream. Cream cheese.  Beverages Coffee and tea, with or without caffeine. Carbonated beverages or energy drinks. Condiments Hot sauce. Barbecue sauce.  Sweets/Desserts Chocolate and cocoa. Donuts. Peppermint and spearmint. Fats and Oils High-fat foods, including [place] fries and potato chips. Other Vinegar. Strong spices, such as black pepper, white pepper, red pepper, cayenne, curry powder, cloves, ginger, and chili powder. The items listed above may not be a complete list of foods and beverages to avoid. Contact your dietitian for more information.   This information is not intended to replace advice given to you by your health care provider. Make sure you discuss any questions you have with your health care provider.   Document Released: 11/21/2005 Document Revised: 12/12/2014 Document Reviewed: 09/25/2013 Elsevier Interactive Patient Education [employer].

## 2015-10-28 LAB — CBC WITH DIFFERENTIAL/PLATELET
BASOS PCT: 0 % (ref 0–1)
Basophils Absolute: 0 10*3/uL (ref 0.0–0.1)
EOS ABS: 0 10*3/uL (ref 0.0–0.7)
EOS PCT: 0 % (ref 0–5)
HCT: 35 % — ABNORMAL LOW (ref 36.0–46.0)
HEMOGLOBIN: 11.5 g/dL — AB (ref 12.0–15.0)
Lymphocytes Relative: 92 % — ABNORMAL HIGH (ref 12–46)
Lymphs Abs: 74.4 10*3/uL — ABNORMAL HIGH (ref 0.7–4.0)
MCH: 31.6 pg (ref 26.0–34.0)
MCHC: 32.9 g/dL (ref 30.0–36.0)
MCV: 96.2 fL (ref 78.0–100.0)
MONO ABS: 0.8 10*3/uL (ref 0.1–1.0)
MONOS PCT: 1 % — AB (ref 3–12)
MPV: 8.5 fL — ABNORMAL LOW (ref 8.6–12.4)
Neutro Abs: 5.7 10*3/uL (ref 1.7–7.7)
Neutrophils Relative %: 7 % — ABNORMAL LOW (ref 43–77)
Platelets: 118 10*3/uL — ABNORMAL LOW (ref 150–400)
RBC: 3.64 MIL/uL — ABNORMAL LOW (ref 3.87–5.11)
RDW: 14.2 % (ref 11.5–15.5)
WBC: 80.9 10*3/uL — AB (ref 4.0–10.5)

## 2015-11-03 ENCOUNTER — Telehealth: Payer: Self-pay | Admitting: *Deleted

## 2015-11-03 NOTE — Telephone Encounter (Signed)
Called patient leaving voicemail with Dr. Mariana Kaufman orders/instructions.  Asked for return call if any further questions.

## 2015-11-03 NOTE — Telephone Encounter (Signed)
Re IVIG 12-2  If she is sick when this is due would delay the treatment, otherwise ok Looks like she was having diarrhea when most recent labs done by Dr Melford Aase

## 2015-11-03 NOTE — Telephone Encounter (Signed)
Patient called requesting information about receiving Immunoglobin in light of labs elevated ar Dr. Idell Pickles.  WBC elevated to 80.9.  Return number [phone].  IVIG due 11-06-2015.  Will I be able to receive it."

## 2015-11-05 ENCOUNTER — Other Ambulatory Visit: Payer: Self-pay | Admitting: Oncology

## 2015-11-06 ENCOUNTER — Ambulatory Visit (HOSPITAL_BASED_OUTPATIENT_CLINIC_OR_DEPARTMENT_OTHER): Payer: 59

## 2015-11-06 VITALS — BP 149/55 | HR 60 | Temp 98.5°F | Resp 24

## 2015-11-06 DIAGNOSIS — C9110 Chronic lymphocytic leukemia of B-cell type not having achieved remission: Secondary | ICD-10-CM

## 2015-11-06 DIAGNOSIS — D849 Immunodeficiency, unspecified: Secondary | ICD-10-CM

## 2015-11-06 MED ORDER — DIPHENHYDRAMINE HCL 25 MG PO CAPS
25.0000 mg | ORAL_CAPSULE | Freq: Once | ORAL | Status: AC
  Administered 2015-11-06: 25 mg via ORAL

## 2015-11-06 MED ORDER — ACETAMINOPHEN 325 MG PO TABS
650.0000 mg | ORAL_TABLET | Freq: Once | ORAL | Status: AC
  Administered 2015-11-06: 650 mg via ORAL

## 2015-11-06 MED ORDER — DIPHENHYDRAMINE HCL 25 MG PO CAPS
ORAL_CAPSULE | ORAL | Status: AC
  Filled 2015-11-06: qty 1

## 2015-11-06 MED ORDER — ACETAMINOPHEN 325 MG PO TABS
ORAL_TABLET | ORAL | Status: AC
  Filled 2015-11-06: qty 2

## 2015-11-06 MED ORDER — IMMUNE GLOBULIN (HUMAN) 10 GM/200ML IV SOLN
410.0000 mg/kg | Freq: Once | INTRAVENOUS | Status: AC
  Administered 2015-11-06: 40 g via INTRAVENOUS
  Filled 2015-11-06: qty 800

## 2015-11-06 MED ORDER — IMMUNE GLOBULIN (HUMAN) 20 GM/200ML IV SOLN: 400.0000 mg/kg | Freq: Once | INTRAVENOUS | Status: DC

## 2015-11-06 MED ORDER — SODIUM CHLORIDE 0.9 % IV SOLN
Freq: Once | INTRAVENOUS | Status: AC
  Administered 2015-11-06: 09:00:00 via INTRAVENOUS

## 2015-11-06 NOTE — Patient Instructions (Signed)
Rh0 [D] Immune Globulin injection What is this medicine? RhO [D] IMMUNE GLOBULIN (i MYOON GLOB yoo lin) is used to treat idiopathic thrombocytopenic purpura (ITP). This medicine is used in RhO negative mothers who are pregnant with a RhO positive child. It is also used after a transfusion of RhO positive blood into a RhO negative person. This medicine may be used for other purposes; ask your health care provider or pharmacist if you have questions. What should I tell my health care provider before I take this medicine? They need to know if you have any of these conditions: -bleeding disorders -low levels of immunoglobulin A in the body -no spleen -an unusual or allergic reaction to human immune globulin, other medicines, foods, dyes, or preservatives -pregnant or trying to get pregnant -breast-feeding How should I use this medicine? This medicine is for injection into a muscle or into a vein. It is given by a health care professional in a hospital or clinic setting. Talk to your pediatrician regarding the use of this medicine in children. This medicine is not approved for use in children. Overdosage: If you think you have taken too much of this medicine contact a poison control center or emergency room at once. NOTE: This medicine is only for you. Do not share this medicine with others. What if I miss a dose? It is important not to miss your dose. Call your doctor or health care professional if you are unable to keep an appointment. What may interact with this medicine? -live virus vaccines, like measles, mumps, or rubella This list may not describe all possible interactions. Give your health care provider a list of all the medicines, herbs, non-prescription drugs, or dietary supplements you use. Also tell them if you smoke, drink alcohol, or use illegal drugs. Some items may interact with your medicine. What should I watch for while using this medicine? This medicine is made from human blood.  It may be possible to pass an infection in this medicine. Talk to your doctor about the risks and benefits of this medicine. This medicine may interfere with live virus vaccines. Before you get live virus vaccines tell your health care professional if you have received this medicine within the past 3 months. What side effects may I notice from receiving this medicine? Side effects that you should report to your doctor or health care professional as soon as possible: -allergic reactions like skin rash, itching or hives, swelling of the face, lips, or tongue -breathing problems -chest pain or tightness -yellowing of the eyes or skin Side effects that usually do not require medical attention (report to your doctor or health care professional if they continue or are bothersome): -fever -pain and tenderness at site where injected This list may not describe all possible side effects. Call your doctor for medical advice about side effects. You may report side effects to FDA at 1-800-FDA-1088. Where should I keep my medicine? This drug is given in a hospital or clinic and will not be stored at home. NOTE: This sheet is a summary. It may not cover all possible information. If you have questions about this medicine, talk to your doctor, pharmacist, or health care provider.    2016, Elsevier/Gold Standard. (2008-07-21 14:06:10)

## 2015-11-10 ENCOUNTER — Ambulatory Visit: Payer: Self-pay

## 2015-11-10 ENCOUNTER — Ambulatory Visit: Payer: Self-pay | Admitting: Internal Medicine

## 2015-12-02 ENCOUNTER — Encounter: Payer: Self-pay | Admitting: Oncology

## 2015-12-02 NOTE — Progress Notes (Signed)
Called pt back regarding disability forms. No answer, left message to call me or Raquel back.

## 2015-12-02 NOTE — Progress Notes (Signed)
I called and left message for patient to call me back on what she needs  [phone] is where I left message on vmail.

## 2015-12-03 ENCOUNTER — Encounter: Payer: Self-pay | Admitting: Oncology

## 2015-12-03 NOTE — Progress Notes (Signed)
I placed fmla form for dr. Marko Plume and noted sharepoint.

## 2015-12-03 NOTE — Progress Notes (Signed)
I called and left message for patient to have fmla forms faxed to me today so I can get to dr. Marko Plume before 5 today and get faxed back. I looked for old forms, but they have been purged. I left fax# for them to get to me.

## 2015-12-04 ENCOUNTER — Encounter: Payer: Self-pay | Admitting: Oncology

## 2015-12-04 NOTE — Progress Notes (Signed)
I faxed fmla forms to [employer]  [phone] and noted sharepoint and will let the patient know it was done.

## 2015-12-06 HISTORY — PX: OTHER SURGICAL HISTORY: SHX169

## 2015-12-07 ENCOUNTER — Other Ambulatory Visit: Payer: Self-pay | Admitting: Oncology

## 2015-12-07 DIAGNOSIS — C9110 Chronic lymphocytic leukemia of B-cell type not having achieved remission: Secondary | ICD-10-CM

## 2015-12-10 ENCOUNTER — Encounter: Payer: Self-pay | Admitting: Oncology

## 2015-12-10 NOTE — Progress Notes (Unsigned)
Medical Oncology  precert for IVIG [id number] still good per [facility] managed care.  Godfrey Pick, MD

## 2015-12-16 ENCOUNTER — Other Ambulatory Visit: Payer: Self-pay | Admitting: *Deleted

## 2015-12-16 MED ORDER — BISOPROLOL-HYDROCHLOROTHIAZIDE 5-6.25 MG PO TABS: 1.0000 | ORAL_TABLET | Freq: Every day | ORAL | Status: DC

## 2015-12-18 ENCOUNTER — Telehealth: Payer: Self-pay | Admitting: Oncology

## 2015-12-18 NOTE — Telephone Encounter (Signed)
Patient had called in on 12/17/15 2:33 and left a message to cancel her tx on 1/20 but will keep lab and md on 1/16.  She is having eye surg on 1/20 and wil advise dr Marko Plume 1/16

## 2015-12-21 ENCOUNTER — Telehealth: Payer: Self-pay | Admitting: Oncology

## 2015-12-21 ENCOUNTER — Other Ambulatory Visit (HOSPITAL_BASED_OUTPATIENT_CLINIC_OR_DEPARTMENT_OTHER): Payer: 59

## 2015-12-21 ENCOUNTER — Ambulatory Visit (HOSPITAL_BASED_OUTPATIENT_CLINIC_OR_DEPARTMENT_OTHER): Payer: 59 | Admitting: Oncology

## 2015-12-21 ENCOUNTER — Encounter: Payer: Self-pay | Admitting: Oncology

## 2015-12-21 VITALS — BP 160/69 | HR 72 | Temp 97.5°F | Resp 18 | Ht 62.5 in | Wt 211.6 lb

## 2015-12-21 DIAGNOSIS — C9110 Chronic lymphocytic leukemia of B-cell type not having achieved remission: Secondary | ICD-10-CM

## 2015-12-21 DIAGNOSIS — D801 Nonfamilial hypogammaglobulinemia: Secondary | ICD-10-CM | POA: Diagnosis not present

## 2015-12-21 LAB — TECHNOLOGIST REVIEW

## 2015-12-21 LAB — CBC WITH DIFFERENTIAL/PLATELET
BASO%: 0.2 % (ref 0.0–2.0)
BASOS ABS: 0.1 10*3/uL (ref 0.0–0.1)
EOS%: 0.3 % (ref 0.0–7.0)
Eosinophils Absolute: 0.2 10*3/uL (ref 0.0–0.5)
HCT: 34.1 % — ABNORMAL LOW (ref 34.8–46.6)
HGB: 11.3 g/dL — ABNORMAL LOW (ref 11.6–15.9)
LYMPH%: 89.9 % — ABNORMAL HIGH (ref 14.0–49.7)
MCH: 31.6 pg (ref 25.1–34.0)
MCHC: 33.1 g/dL (ref 31.5–36.0)
MCV: 95.3 fL (ref 79.5–101.0)
MONO#: 1.1 10*3/uL — ABNORMAL HIGH (ref 0.1–0.9)
MONO%: 1.4 % (ref 0.0–14.0)
NEUT#: 6.7 10*3/uL — ABNORMAL HIGH (ref 1.5–6.5)
NEUT%: 8.2 % — AB (ref 38.4–76.8)
Platelets: 106 10*3/uL — ABNORMAL LOW (ref 145–400)
RBC: 3.58 10*6/uL — AB (ref 3.70–5.45)
RDW: 14.4 % (ref 11.2–14.5)
WBC: 81 10*3/uL (ref 3.9–10.3)
lymph#: 72.8 10*3/uL — ABNORMAL HIGH (ref 0.9–3.3)
nRBC: 0 % (ref 0–0)

## 2015-12-21 LAB — COMPREHENSIVE METABOLIC PANEL
ALK PHOS: 72 U/L (ref 40–150)
ALT: 26 U/L (ref 0–55)
AST: 28 U/L (ref 5–34)
Albumin: 4.2 g/dL (ref 3.5–5.0)
Anion Gap: 11 mEq/L (ref 3–11)
BUN: 18.6 mg/dL (ref 7.0–26.0)
CHLORIDE: 102 meq/L (ref 98–109)
CO2: 21 meq/L — AB (ref 22–29)
Calcium: 9.3 mg/dL (ref 8.4–10.4)
Creatinine: 0.9 mg/dL (ref 0.6–1.1)
EGFR: 67 mL/min/{1.73_m2} — AB (ref >90)
GLUCOSE: 111 mg/dL (ref 70–140)
POTASSIUM: 3.7 meq/L (ref 3.5–5.1)
SODIUM: 134 meq/L — AB (ref 136–145)
Total Bilirubin: 0.6 mg/dL (ref 0.20–1.20)
Total Protein: 6.8 g/dL (ref 6.4–8.3)

## 2015-12-21 LAB — LACTATE DEHYDROGENASE: LDH: 203 U/L (ref 125–245)

## 2015-12-21 NOTE — Progress Notes (Signed)
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 Last imaging CT 02-2015 no splenomegaly and no progressive adenopathy.  Patient had IVIG on 09-18-15 and 11-06-15, with another infusion upcoming (she requests to move this to 01-08-16). Patient reports no sinus or lower respiratory infections in past 2 months, despite coworkers being sick. She has not had significant fevers or aches after the IVIG. She had right cataract extraction first week in Jan and will have left cataract removed later this week. Vision is very good now on right and she had no complications from that procedure. She is using steroid eye drops for the cataract surgery, 2 qtts bid x 4 weeks. She has had no other changes in medications. She denies bleeding, change in adenopathy, energy and appetite at baseline, no SOB, no abdominal pain, bowels fine, no swelling LE. Remainder of 10 point Review of Systems negative/ unchanged   No PAC Flu vaccine 08-13-15 Prevnar vaccine 09-23-15      ONCOLOGIC HISTORY Patient was diagnosed with stage I CLL in 11-2008 by biopsy of left axillary lymph node found on routine mammograms. She has not required treatment for the CLL, however does have fluctuations in WBC at times related to intermittent steroids used for recurrent sinusitis and bronchitis. Cytogenetics 06-03-14 at [facility] suggests 13q- (no deletions ATM, CEP 12, 13q34 or p53). She has not had bulky adenopathy, previous anemia or thrombocytopenia. She had had numerous infections, mostly respiratory, with low immune globulins. She began prophylactic IVIG 10-03-14, tolerated adequately at total 20 gm, then dose  increased to 400 mg/kg = 35 gm on 11-14-14   Objective:  Vital signs in last 24 hours:  BP 160/69 mmHg  Pulse 72  Temp(Src) 97.5 F (36.4 C) (Oral)  Resp 18  Ht 5' 2.5" (1.588 m)  Wt 211 lb 9.6 oz (95.981 kg)  BMI 38.06 kg/m2  SpO2 99% Weight up 6 lbs Alert, oriented and appropriate. Ambulatory without difficulty. Respirations not labored RA. Looks comfortable, very pleasant and talkative as usual   HEENT:PERRL, sclerae not  Injected or icteric. Oral mucosa moist without lesions, posterior pharynx clear.  Neck supple. No JVD.  Lymphatics:bilateral soft cervical nodes up to ~ 2 cm, no supraclavicular Resp: clear to auscultation bilaterally and normal percussion bilaterally Cardio: regular rate and rhythm. No gallop. GI: soft, nontender, not distended, no mass or organomegaly. Normally active bowel sounds. Musculoskeletal/ Extremities: without pitting edema, cords, tenderness Neuro: no peripheral neuropathy. Otherwise nonfocal. PSYCH appropriate mood and affect Skin without rash, ecchymosis, petechiae   Lab Results:  Results for orders placed or performed in visit on 12/21/15  TECHNOLOGIST REVIEW  Result Value Ref Range   Technologist Review Variant lymphs present, Smudge Cells Present   CBC with Differential  Result Value Ref Range   WBC 81.0 (HH) 3.9 - 10.3 10e3/uL   NEUT# 6.7 (H) 1.5 - 6.5 10e3/uL   HGB 11.3 (L) 11.6 - 15.9 g/dL   HCT 34.1 (L) 34.8 - 46.6 %   Platelets 106 (L) 145 - 400 10e3/uL   MCV 95.3 79.5 - 101.0 fL   MCH 31.6 25.1 - 34.0 pg   MCHC 33.1 31.5 - 36.0 g/dL   RBC 3.58 (L) 3.70 -  5.45 10e6/uL   RDW 14.4 11.2 - 14.5 %   lymph# 72.8 (H) 0.9 - 3.3 10e3/uL   MONO# 1.1 (H) 0.1 - 0.9 10e3/uL   Eosinophils Absolute 0.2 0.0 - 0.5 10e3/uL   Basophils Absolute 0.1 0.0 - 0.1 10e3/uL   NEUT% 8.2 (L) 38.4 - 76.8 %   LYMPH% 89.9 (H) 14.0 - 49.7 %   MONO% 1.4 0.0 - 14.0 %   EOS% 0.3 0.0 - 7.0 %   BASO% 0.2 0.0 - 2.0 %   nRBC 0 0 - 0 %  Comprehensive  metabolic panel  Result Value Ref Range   Sodium 134 (L) 136 - 145 mEq/L   Potassium 3.7 3.5 - 5.1 mEq/L   Chloride 102 98 - 109 mEq/L   CO2 21 (L) 22 - 29 mEq/L   Glucose 111 70 - 140 mg/dl   BUN 18.6 7.0 - 26.0 mg/dL   Creatinine 0.9 0.6 - 1.1 mg/dL   Total Bilirubin 0.60 0.20 - 1.20 mg/dL   Alkaline Phosphatase 72 40 - 150 U/L   AST 28 5 - 34 U/L   ALT 26 0 - 55 U/L   Total Protein 6.8 6.4 - 8.3 g/dL   Albumin 4.2 3.5 - 5.0 g/dL   Calcium 9.3 8.4 - 10.4 mg/dL   Anion Gap 11 3 - 11 mEq/L   EGFR 67 (L) >90 ml/min/1.73 m2  Lactate dehydrogenase (LDH) - [facility]  Result Value Ref Range   LDH 203 125 - 245 U/L     Studies/Results:  No results found.  Medications: I have reviewed the patient's current medications. Note steroid eyedrops, which I believe were initially more frequent than present bid  DISCUSSION Has done better without infectious illness so far this winter since using IVIG, which she would like to continue for now. Will change date due to upcoming cataract surgery WBC more elevated with increased lymphocyte 5, and platelets trending down, tho note steroid ophthalmic medication may be affecting WBC to some extent. I have told patient that it is likely that she will need to begin treatment for the CLL at least in next several months if this trend continues, tho she is reluctant to consider this. I will see her back with labs in March, which should give her time to be completely off the steroid eye drops after upcoming left cataract extraction. She should call before that appointment if she has symptoms of progressive disease. She prefers not to change to one of my partners in hematology.  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 No unfavorable cytogenetics (del17p and TP53 not present, and del13q present). Counts likely indicate further progression tho may be reflecting steroid ophth medication. Will  follow up March after off steroids.Continue IVIG, which is clinically improving her usual recurrent respiratory illnesses 2.post cataract extraction OD early Jan and for OS later this week.  3.hepatitis A 4.HTN, elevated cholesterol.  5.GERD 6.obstructive sleep apnea: uses CPAP  7.flu vaccine 08-13-15 8.stable mild hypoNa likely from diuretic 9.Deconditioning: she has not begun regular exercise program 10.obesity: BMI 38. No regular exercise, which we have discussed    Questions answered. IVIG orders confirmed. She knows to call if needed prior to next scheduled visit.Time spent 25 min including >50% counseling and coordination of care.   Gordy Levan, MD   12/21/2015, 5:57 PM

## 2015-12-21 NOTE — Telephone Encounter (Signed)
Appointments made and avs printed for patient °

## 2015-12-25 ENCOUNTER — Ambulatory Visit: Payer: 59

## 2015-12-26 ENCOUNTER — Encounter: Payer: Self-pay | Admitting: *Deleted

## 2015-12-27 MED ORDER — DIPHENHYDRAMINE HCL 25 MG PO CAPS: 25.0000 mg | ORAL_CAPSULE | Freq: Once | ORAL | Status: DC

## 2015-12-27 MED ORDER — IMMUNE GLOBULIN (HUMAN) 20 GM/200ML IV SOLN: 400.0000 mg/kg | Freq: Once | INTRAVENOUS | Status: DC

## 2016-01-06 ENCOUNTER — Telehealth: Payer: Self-pay

## 2016-01-06 NOTE — Telephone Encounter (Signed)
Pt left voice message stating she was sick since Saturday. She is now back at work. She has no fever. She has an appt Friday for IVIG and she was wondering if she should come in for this or reschedule. Pt is unavailable until her lunch break. I was unable to ascertain what she meant by "sick". She asked [place] to leave a voice message on her phone.

## 2016-01-07 ENCOUNTER — Other Ambulatory Visit: Payer: Self-pay | Admitting: Oncology

## 2016-01-07 ENCOUNTER — Telehealth: Payer: Self-pay | Admitting: Oncology

## 2016-01-07 NOTE — Telephone Encounter (Signed)
lvm that Dr Edwyna Shell says to wait on IVIG for about another week. If she were to get a fever it could be from IVIG or from lingering infection. Told her to expect a call from scheduler.

## 2016-01-07 NOTE — Telephone Encounter (Signed)
Pt returned call this AM. She had a sinus infection, and sore throat with fever. She saw PCP and was placed on z-pack. She feels much better. Her last fever was Tuesday AM.

## 2016-01-07 NOTE — Telephone Encounter (Signed)
per pof to CX pt appt-pt aware per pof sent MW email to r/s trmt per pof-call pt after reply

## 2016-01-08 ENCOUNTER — Telehealth: Payer: Self-pay | Admitting: Oncology

## 2016-01-08 ENCOUNTER — Encounter: Payer: Self-pay | Admitting: Oncology

## 2016-01-08 ENCOUNTER — Telehealth: Payer: Self-pay | Admitting: *Deleted

## 2016-01-08 ENCOUNTER — Ambulatory Visit: Payer: 59

## 2016-01-08 NOTE — Telephone Encounter (Signed)
Per staff message and POF I have scheduled appts. Advised scheduler of appts. JMW  

## 2016-01-08 NOTE — Telephone Encounter (Signed)
per pof to to r/s appt-per reply cld pt and adv of time & date of appt on 2/13

## 2016-01-08 NOTE — Progress Notes (Signed)
I called and left message with my fax for her to fax fmla forms.

## 2016-01-11 ENCOUNTER — Encounter: Payer: Self-pay | Admitting: Oncology

## 2016-01-11 NOTE — Progress Notes (Signed)
I placed form for dr. Marko Plume to sign

## 2016-01-11 NOTE — Progress Notes (Signed)
Recd fmla form via fax. Patient said new company so new form has to be done.

## 2016-01-12 ENCOUNTER — Encounter: Payer: Self-pay | Admitting: Oncology

## 2016-01-12 ENCOUNTER — Ambulatory Visit: Payer: Self-pay | Admitting: Physician Assistant

## 2016-01-12 ENCOUNTER — Telehealth: Payer: Self-pay | Admitting: *Deleted

## 2016-01-12 NOTE — Progress Notes (Signed)
I faxed [employer] [phone] and let patient know they were faxed and her copy ready for pick up when she comes 01/18/16

## 2016-01-12 NOTE — Telephone Encounter (Signed)
Patient called and left message to reshedule her apapt next week. I have moved appt and called left patient a message with new date/time.

## 2016-01-12 NOTE — Progress Notes (Signed)
I left patient mess her forms were faxed and copy for her with ms wilma. Sent to medical records and noted sharepoint

## 2016-01-13 ENCOUNTER — Telehealth: Payer: Self-pay

## 2016-01-13 NOTE — Telephone Encounter (Signed)
Ms Minarik called acknowledging the change of her appt to 2/16 at 0730

## 2016-01-18 ENCOUNTER — Ambulatory Visit: Payer: 59

## 2016-01-21 ENCOUNTER — Other Ambulatory Visit: Payer: Self-pay | Admitting: Oncology

## 2016-01-21 ENCOUNTER — Ambulatory Visit (HOSPITAL_BASED_OUTPATIENT_CLINIC_OR_DEPARTMENT_OTHER): Payer: 59

## 2016-01-21 VITALS — BP 160/48 | HR 62 | Temp 98.2°F | Resp 18

## 2016-01-21 DIAGNOSIS — C9110 Chronic lymphocytic leukemia of B-cell type not having achieved remission: Secondary | ICD-10-CM

## 2016-01-21 DIAGNOSIS — D801 Nonfamilial hypogammaglobulinemia: Secondary | ICD-10-CM | POA: Diagnosis not present

## 2016-01-21 MED ORDER — ACETAMINOPHEN 325 MG PO TABS: 325.0000 mg | ORAL_TABLET | Freq: Once | ORAL | Status: DC

## 2016-01-21 MED ORDER — IMMUNE GLOBULIN (HUMAN) 5 GM/100ML IV SOLN
40.0000 g | Freq: Once | INTRAVENOUS | Status: AC
  Administered 2016-01-21: 40 g via INTRAVENOUS
  Filled 2016-01-21: qty 800

## 2016-01-21 MED ORDER — DIPHENHYDRAMINE HCL 25 MG PO CAPS
ORAL_CAPSULE | ORAL | Status: AC
  Filled 2016-01-21: qty 1

## 2016-01-21 MED ORDER — DIPHENHYDRAMINE HCL 25 MG PO CAPS
25.0000 mg | ORAL_CAPSULE | Freq: Once | ORAL | Status: AC
  Administered 2016-01-21: 25 mg via ORAL

## 2016-01-21 NOTE — Patient Instructions (Signed)

## 2016-02-04 ENCOUNTER — Encounter: Payer: Self-pay | Admitting: Physician Assistant

## 2016-02-04 ENCOUNTER — Ambulatory Visit (INDEPENDENT_AMBULATORY_CARE_PROVIDER_SITE_OTHER): Payer: 59 | Admitting: Physician Assistant

## 2016-02-04 VITALS — BP 136/80 | HR 82 | Temp 98.1°F | Resp 16 | Ht 62.5 in | Wt 209.0 lb

## 2016-02-04 DIAGNOSIS — I10 Essential (primary) hypertension: Secondary | ICD-10-CM

## 2016-02-04 DIAGNOSIS — R7303 Prediabetes: Secondary | ICD-10-CM | POA: Diagnosis not present

## 2016-02-04 DIAGNOSIS — C44310 Basal cell carcinoma of skin of unspecified parts of face: Secondary | ICD-10-CM | POA: Diagnosis not present

## 2016-02-04 DIAGNOSIS — R079 Chest pain, unspecified: Secondary | ICD-10-CM | POA: Diagnosis not present

## 2016-02-04 DIAGNOSIS — E559 Vitamin D deficiency, unspecified: Secondary | ICD-10-CM | POA: Diagnosis not present

## 2016-02-04 DIAGNOSIS — C9110 Chronic lymphocytic leukemia of B-cell type not having achieved remission: Secondary | ICD-10-CM | POA: Diagnosis not present

## 2016-02-04 DIAGNOSIS — E782 Mixed hyperlipidemia: Secondary | ICD-10-CM | POA: Diagnosis not present

## 2016-02-04 DIAGNOSIS — E871 Hypo-osmolality and hyponatremia: Secondary | ICD-10-CM | POA: Diagnosis not present

## 2016-02-04 DIAGNOSIS — G4733 Obstructive sleep apnea (adult) (pediatric): Secondary | ICD-10-CM | POA: Diagnosis not present

## 2016-02-04 DIAGNOSIS — F411 Generalized anxiety disorder: Secondary | ICD-10-CM

## 2016-02-04 DIAGNOSIS — Z79899 Other long term (current) drug therapy: Secondary | ICD-10-CM

## 2016-02-04 MED ORDER — ALPRAZOLAM 1 MG PO TABS
ORAL_TABLET | ORAL | Status: DC
Start: 2016-02-04 — End: 2016-11-15

## 2016-02-04 NOTE — Progress Notes (Signed)
Assessment and Plan:  Hypertension: monitor blood pressure at home. Continue DASH diet.  Reminder to go to the ER if any CP, SOB, nausea, dizziness, severe HA, changes vision/speech, left arm numbness and tingling, and jaw pain. Cholesterol: Continue diet and exercise. Check cholesterol.  Pre-diabetes-Continue diet and exercise. Check A1C Vitamin D Def- check level and continue medications.  CLL- continue follow up.  Chest pain- atypical chest pain with normal stress test 2014, EKG without changes, patient very anxious, start on xanax 1/2 pill daily did not want long term medication at this time, close follow up 2 weeks, if any worsening SOB, CP, etc go to ER, patient expressed understanding. Follow up with cardio Skin- will refer to skin surgery center for evaluation of non healing ulcer on nose where previous BCC was removed.   Continue diet and meds as discussed. Further disposition pending results of labs. OVER 40 minutes of exam, counseling, chart review, referral performed   HPI 71 y.o. female  presents for over due 3 month follow up, last OV was in 09/2014 for CPE- follows up with hypertension, hyperlipidemia, prediabetes, Vitamin D, and complicated history of CLL and has has CP.   Her blood pressure has not been controlled at home, she went in to see Rebecca Bond and saw his PA for elevated BP, and she was put on hydralazine 50mg  BId which she states she has not well with it and wants it changed, today their BP is BP: 136/80 mmHg   She has been having a lot of chest discomfort, feels like an elephant on her chest, with some shortness of breath, has had some neck and shoulder pain/tight muscles, she has had increased stress at work, has been having car trouble, "feels like one thing after another". She had normal stress test 2014, and cath 2003. Worse episode last Friday, had busy day at work, doctor started to yell at her and had increasing stress, at that time she started to have tightness  chest and SOB, shaky, had HA, denies diaphoresis, nausea, dizziness, this lasted the rest of the day. She took 1/2 xanax, watched a funny show at home and states the chest pain finally subsided. She has been more fatigued lately. She is not normally on ASA. She has a set of stairs in her house, has fatigue and some shortness of breath with flight of stairs but no dizziness, CP, diaphoresis, nausea. She has some leg swelling but only on HCTZ every other day due to hyponatremia   She does not workout. She denies chest pain, shortness of breath, dizziness.  She is on cholesterol medication and denies myalgias. Her cholesterol is at goal. The cholesterol last visit was:   Lab Results  Component Value Date   CHOL 191 09/23/2015   HDL 53 09/23/2015   LDLCALC 110 09/23/2015   TRIG 139 09/23/2015   CHOLHDL 3.6 09/23/2015    She has been working on diet and exercise for prediabetes, and denies paresthesia of the feet, polydipsia, polyuria and visual disturbances. Last A1C in the office was:  Lab Results  Component Value Date   HGBA1C 5.9* 09/23/2015   Patient is on Vitamin D supplement.   Lab Results  Component Value Date   VD25OH 47 09/23/2015    BMI is Body mass index is 37.59 kg/(m^2)., she is working on diet and exercise.+ OSA on CPAP.  Wt Readings from Last 3 Encounters:  02/04/16 209 lb (94.802 kg)  12/21/15 211 lb 9.6 oz (95.981 kg)  10/27/15  206 lb (93.441 kg)  Patient was dx'd with CLL in Dec 2009 and been monitored expectantly by Dr Rebecca Bond and more recently by Dr Rebecca Bond. She is receiving IVIG, most recent was Feb. 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  Lab Results  Component Value Date   WBC 81.0* 12/21/2015   HGB 11.3* 12/21/2015   HCT 34.1* 12/21/2015   MCV 95.3 12/21/2015   PLT 106* 12/21/2015     Current Medications:  Current Outpatient Prescriptions on File Prior to Visit  Medication Sig Dispense Refill  . ALPRAZolam (XANAX) 1 MG tablet TAKE  1/2 TO 1 TABLET BY MOUTH 3 TIMES A DAY AS NEEDED FOR ANXIETY 90 tablet 1  . Ascorbic Acid (VITAMIN C) 1000 MG tablet Take 1,000 mg by mouth daily.    . B Complex Vitamins (VITAMIN-B COMPLEX) TABS Take 1 tablet by mouth daily.    [name] Kitchen buPROPion (WELLBUTRIN XL) 300 MG 24 hr tablet Take 1 tablet every morning for mood & depression 90 tablet 1  . BYSTOLIC 10 MG tablet Take 30 mg by mouth daily.  0  . Cholecalciferol (VITAMIN D PO) Take 10,000 Units by mouth daily.     . DUREZOL 0.05 % EMUL INSTILL 1 DROP INTO SURGICAL EYE TWICE A DAY  0  . ezetimibe (ZETIA) 10 MG tablet Take 1 tablet (10 mg total) by mouth at bedtime. 30 tablet 6  . fexofenadine (ALLEGRA) 180 MG tablet Take 1 tablet (180 mg total) by mouth daily. Take PRN for allergies. 30 tablet 6  . FLORASTOR 250 MG capsule   1  . hydrochlorothiazide (HYDRODIURIL) 12.5 MG tablet TAKE 1 TABLET BY MOUTH ONCE DAILY (Patient taking differently: TAKE 1 TABLET BY MOUTH every other day) 90 tablet 1  . OVER THE COUNTER MEDICATION Nasocort daily as needed    . OVER THE COUNTER MEDICATION Sustain Ultra Eye drops    . OVER THE COUNTER MEDICATION Calcium 250 mg/Magnesium 100 mg 2 tabs daily    . pantoprazole (PROTONIX) 40 MG tablet Take 1 tablet (40 mg total) by mouth daily. 30 tablet 6  . PROLENSA 0.07 % SOLN INSTILL 1 DROP INTO SURGICAL EYE DAILY  0   No current facility-administered medications on file prior to visit.   Medical History:  Past Medical History  Diagnosis Date  . Benign essential HTN 11/22/2011  . CLL (chronic lymphocytic leukemia) ([place])   . Pneumonia 01/2010  . History of bronchitis 08/02/2012    "maybe once/yr if that much; last time 01/2010"  . Recurrent sinus infections 08/02/2012  . 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 High cholesterol   . 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 Prediabetes   . Anxiety   . Depression   . GERD (gastroesophageal reflux disease)   . OSA (obstructive sleep apnea)     Allergies:  Allergies  Allergen Reactions  . Hyzaar [Losartan Potassium-Hctz] Other (See Comments)    "messed up my sodium counts" swells up lips.  Rebecca Bond [Pseudoephedrine Hcl] Palpitations  . Biaxin [Clarithromycin]     GI Upset  . Celexa [Citalopram]   . Flexeril [Cyclobenzaprine]     "zombie-like" feeling  . Fosamax [Alendronate Sodium]     GI upset  . Iohexol Hives     Code: HIVES, Desc: pt states she broke out in hive 20 yrs ago from IV contrast.     . Losartan     Angioedema  . Meloxicam  GI upset  . Norvasc [Amlodipine] Swelling  . Pseudoephedrine     Palpitations  . Zoloft [Sertraline Hcl]      Review of Systems:  Review of Systems  Constitutional: Positive for malaise/fatigue. Negative for fever, chills, weight loss and diaphoresis.  HENT: Negative for congestion, ear discharge, ear pain, hearing loss, nosebleeds, sore throat and tinnitus.        + TMJ  Eyes: Negative.  Negative for blurred vision and double vision.  Respiratory: Positive for shortness of breath. Negative for cough, hemoptysis, sputum production, wheezing and stridor.   Cardiovascular: Positive for chest pain. Negative for palpitations, orthopnea, claudication, leg swelling and PND.  Gastrointestinal: Positive for heartburn. Negative for nausea, vomiting, abdominal pain, diarrhea, constipation, blood in stool and melena.  Genitourinary: Negative for dysuria, urgency, frequency, hematuria and flank pain.  Musculoskeletal: Positive for myalgias and back pain. Negative for joint pain, falls and neck pain.  Skin: Negative.   Neurological: Negative for dizziness, tingling, tremors, sensory change, speech change, focal weakness, seizures, loss of consciousness, weakness and headaches.  Endo/Heme/Allergies: Negative for environmental allergies and polydipsia. Does not bruise/bleed easily.  Psychiatric/Behavioral: Positive for depression. Negative for suicidal ideas, hallucinations, memory loss and  substance abuse. The patient is nervous/anxious. The patient does not have insomnia.     Family history- Review and unchanged Social history- Review and unchanged Physical Exam: BP 136/80 mmHg  Pulse 82  Temp(Src) 98.1 F (36.7 C) (Temporal)  Resp 16  Ht 5' 2.5" (1.588 m)  Wt 209 lb (94.802 kg)  BMI 37.59 kg/m2  SpO2 97% Wt Readings from Last 3 Encounters:  02/04/16 209 lb (94.802 kg)  12/21/15 211 lb 9.6 oz (95.981 kg)  10/27/15 206 lb (93.441 kg)   General Appearance: Well nourished, in no apparent distress, no diaphroesis Eyes: PERRLA, EOMs, conjunctiva no swelling or erythema Sinuses: + Frontal/maxillary tenderness ENT/Mouth: Ext aud canals clear, TMs without erythema, bulging. No erythema, swelling, or exudate on post pharynx.  Tonsils not swollen or erythematous. Hearing normal. + TMJ tenderness Neck: Supple, thyroid normal.  Respiratory: Respiratory effort normal, BS equal bilaterally without rales, rhonchi, wheezing or stridor.  Cardio: RRR with no MRGs. Brisk peripheral pulses without edema.  Abdomen: Soft, + BS, obese, diffusely overly tender to palpation, no peritoneal signs, + guarding, without rebound, hernias, masses. Lymphatics: Non tender without lymphadenopathy.  Musculoskeletal: Full ROM, 5/5 strength, Normal gait,+ chest wall tenderness Skin: Non healing ulcer on nose on area where caner was removed. Warm, dry without rashes, lesions, ecchymosis.  Neuro: Cranial nerves intact. Normal muscle tone, no cerebellar symptoms. Psych: Awake and oriented X 3, normal affect, appears anxious, Insight and Judgment appropriate.    Vicie Mutters, PA-C 3:42 PM [facility]

## 2016-02-04 NOTE — Patient Instructions (Signed)
If you have any worsening chest pain, shortness of breath, nausea, go to ER  Nonspecific Chest Pain  Chest pain can be caused by many different conditions. There is always a chance that your pain could be related to something serious, such as a heart attack or a blood clot in your lungs. Chest pain can also be caused by conditions that are not life-threatening. If you have chest pain, it is very important to follow up with your health care provider. CAUSES  Chest pain can be caused by:  Heartburn.  Pneumonia or bronchitis.  Anxiety or stress.  Inflammation around your heart (pericarditis) or lung (pleuritis or pleurisy).  A blood clot in your lung.  A collapsed lung (pneumothorax). It can develop suddenly on its own (spontaneous pneumothorax) or from trauma to the chest.  Shingles infection (varicella-zoster virus).  Heart attack.  Damage to the bones, muscles, and cartilage that make up your chest wall. This can include:  Bruised bones due to injury.  Strained muscles or cartilage due to frequent or repeated coughing or overwork.  Fracture to one or more ribs.  Sore cartilage due to inflammation (costochondritis). RISK FACTORS  Risk factors for chest pain may include:  Activities that increase your risk for trauma or injury to your chest.  Respiratory infections or conditions that cause frequent coughing.  Medical conditions or overeating that can cause heartburn.  Heart disease or family history of heart disease.  Conditions or health behaviors that increase your risk of developing a blood clot.  Having had chicken pox (varicella zoster). SIGNS AND SYMPTOMS Chest pain can feel like:  Burning or tingling on the surface of your chest or deep in your chest.  Crushing, pressure, aching, or squeezing pain.  Dull or sharp pain that is worse when you move, cough, or take a deep breath.  Pain that is also felt in your back, neck, shoulder, or arm, or pain that spreads  to any of these areas. Your chest pain may come and go, or it may stay constant. DIAGNOSIS Lab tests or other studies may be needed to find the cause of your pain. Your health care provider may have you take a test called an ambulatory ECG (electrocardiogram). An ECG records your heartbeat patterns at the time the test is performed. You may also have other tests, such as:  Transthoracic echocardiogram (TTE). During echocardiography, sound waves are used to create a picture of all of the heart structures and to look at how blood flows through your heart.  Transesophageal echocardiogram (TEE).This is a more advanced imaging test that obtains images from inside your body. It allows your health care provider to see your heart in finer detail.  Cardiac monitoring. This allows your health care provider to monitor your heart rate and rhythm in real time.  Holter monitor. This is a portable device that records your heartbeat and can help to diagnose abnormal heartbeats. It allows your health care provider to track your heart activity for several days, if needed.  Stress tests. These can be done through exercise or by taking medicine that makes your heart beat more quickly.  Blood tests.  Imaging tests. TREATMENT  Your treatment depends on what is causing your chest pain. Treatment may include:  Medicines. These may include:  Acid blockers for heartburn.  Anti-inflammatory medicine.  Pain medicine for inflammatory conditions.  Antibiotic medicine, if an infection is present.  Medicines to dissolve blood clots.  Medicines to treat coronary artery disease.  Supportive care  for conditions that do not require medicines. This may include:  Resting.  Applying heat or cold packs to injured areas.  Limiting activities until pain decreases. HOME CARE INSTRUCTIONS  If you were prescribed an antibiotic medicine, finish it all even if you start to feel better.  Avoid any activities that  bring on chest pain.  Do not use any tobacco products, including cigarettes, chewing tobacco, or electronic cigarettes. If you need help quitting, ask your health care provider.  Do not drink alcohol.  Take medicines only as directed by your health care provider.  Keep all follow-up visits as directed by your health care provider. This is important. This includes any further testing if your chest pain does not go away.  If heartburn is the cause for your chest pain, you may be told to keep your head raised (elevated) while sleeping. This reduces the chance that acid will go from your stomach into your esophagus.  Make lifestyle changes as directed by your health care provider. These may include:  Getting regular exercise. Ask your health care provider to suggest some activities that are safe for you.  Eating a heart-healthy diet. A registered dietitian can help you to learn healthy eating options.  Maintaining a healthy weight.  Managing diabetes, if necessary.  Reducing stress. SEEK MEDICAL CARE IF:  Your chest pain does not go away after treatment.  You have a rash with blisters on your chest.  You have a fever. SEEK IMMEDIATE MEDICAL CARE IF:   Your chest pain is worse.  You have an increasing cough, or you cough up blood.  You have severe abdominal pain.  You have severe weakness.  You faint.  You have chills.  You have sudden, unexplained chest discomfort.  You have sudden, unexplained discomfort in your arms, back, neck, or jaw.  You have shortness of breath at any time.  You suddenly start to sweat, or your skin gets clammy.  You feel nauseous or you vomit.  You suddenly feel light-headed or dizzy.  Your heart begins to beat quickly, or it feels like it is skipping beats. These symptoms may represent a serious problem that is an emergency. Do not wait to see if the symptoms will go away. Get medical help right away. Call your local emergency services  (911 in the [place] Do not drive yourself to the hospital.   This information is not intended to replace advice given to you by your health care provider. Make sure you discuss any questions you have with your health care provider.   Document Released: 08/31/2005 Document Revised: 12/12/2014 Document Reviewed: 06/27/2014 Elsevier Interactive Patient Education [employer].

## 2016-02-05 LAB — MAGNESIUM: MAGNESIUM: 1.5 mg/dL (ref 1.5–2.5)

## 2016-02-05 LAB — BASIC METABOLIC PANEL WITH GFR
BUN: 19 mg/dL (ref 7–25)
CALCIUM: 9.2 mg/dL (ref 8.6–10.4)
CHLORIDE: 100 mmol/L (ref 98–110)
CO2: 23 mmol/L (ref 20–31)
CREATININE: 0.85 mg/dL (ref 0.50–0.99)
GFR, EST NON AFRICAN AMERICAN: 70 mL/min (ref >=60)
GFR, Est African American: 81 mL/min (ref >=60)
GLUCOSE: 97 mg/dL (ref 65–99)
Potassium: 3.6 mmol/L (ref 3.5–5.3)
Sodium: 134 mmol/L — ABNORMAL LOW (ref 135–146)

## 2016-02-05 LAB — CBC WITH DIFFERENTIAL/PLATELET
Basophils Absolute: 0 10*3/uL (ref 0.0–0.1)
Basophils Relative: 0 % (ref 0–1)
Eosinophils Absolute: 0 10*3/uL (ref 0.0–0.7)
Eosinophils Relative: 0 % (ref 0–5)
HEMATOCRIT: 35 % — AB (ref 36.0–46.0)
HEMOGLOBIN: 11.5 g/dL — AB (ref 12.0–15.0)
LYMPHS PCT: 93 % — AB (ref 12–46)
Lymphs Abs: 85.7 10*3/uL — ABNORMAL HIGH (ref 0.7–4.0)
MCH: 30.9 pg (ref 26.0–34.0)
MCHC: 32.9 g/dL (ref 30.0–36.0)
MCV: 94.1 fL (ref 78.0–100.0)
MONO ABS: 0 10*3/uL — AB (ref 0.1–1.0)
MONOS PCT: 0 % — AB (ref 3–12)
MPV: 8.7 fL (ref 8.6–12.4)
NEUTROS ABS: 6.5 10*3/uL (ref 1.7–7.7)
Neutrophils Relative %: 7 % — ABNORMAL LOW (ref 43–77)
Platelets: 116 10*3/uL — ABNORMAL LOW (ref 150–400)
RBC: 3.72 MIL/uL — ABNORMAL LOW (ref 3.87–5.11)
RDW: 14.2 % (ref 11.5–15.5)
WBC: 92.2 10*3/uL — ABNORMAL HIGH (ref 4.0–10.5)

## 2016-02-05 LAB — HEPATIC FUNCTION PANEL
ALT: 23 U/L (ref 6–29)
AST: 26 U/L (ref 10–35)
Albumin: 4.3 g/dL (ref 3.6–5.1)
Alkaline Phosphatase: 64 U/L (ref 33–130)
BILIRUBIN INDIRECT: 0.4 mg/dL (ref 0.2–1.2)
Bilirubin, Direct: 0.1 mg/dL (ref <=0.2)
TOTAL PROTEIN: 6.9 g/dL (ref 6.1–8.1)
Total Bilirubin: 0.5 mg/dL (ref 0.2–1.2)

## 2016-02-05 LAB — HEMOGLOBIN A1C
Hgb A1c MFr Bld: 5.9 % — ABNORMAL HIGH (ref <5.7)
Mean Plasma Glucose: 123 mg/dL — ABNORMAL HIGH (ref <117)

## 2016-02-05 LAB — VITAMIN D 25 HYDROXY (VIT D DEFICIENCY, FRACTURES): VIT D 25 HYDROXY: 47 ng/mL (ref 30–100)

## 2016-02-05 LAB — LIPID PANEL
CHOL/HDL RATIO: 3.2 ratio (ref <=5.0)
CHOLESTEROL: 188 mg/dL (ref 125–200)
HDL: 59 mg/dL (ref >=46)
LDL CALC: 105 mg/dL (ref <130)
Triglycerides: 118 mg/dL (ref <150)
VLDL: 24 mg/dL (ref <30)

## 2016-02-05 LAB — TSH: TSH: 3.23 mIU/L

## 2016-02-11 ENCOUNTER — Other Ambulatory Visit: Payer: Self-pay | Admitting: Physician Assistant

## 2016-02-11 NOTE — Telephone Encounter (Signed)
Rx(s) sent to pharmacy electronically. Last OV 01/2015 with B. Samara Snide, [place]

## 2016-02-16 ENCOUNTER — Encounter: Payer: Self-pay | Admitting: Internal Medicine

## 2016-02-16 ENCOUNTER — Ambulatory Visit (INDEPENDENT_AMBULATORY_CARE_PROVIDER_SITE_OTHER): Payer: 59 | Admitting: Internal Medicine

## 2016-02-16 VITALS — BP 166/90 | HR 70 | Temp 98.0°F | Resp 18 | Ht 62.5 in | Wt 208.0 lb

## 2016-02-16 DIAGNOSIS — J0141 Acute recurrent pansinusitis: Secondary | ICD-10-CM

## 2016-02-16 MED ORDER — AZITHROMYCIN 250 MG PO TABS: ORAL_TABLET | ORAL | Status: DC

## 2016-02-16 MED ORDER — PREDNISONE 20 MG PO TABS: ORAL_TABLET | ORAL | Status: DC

## 2016-02-16 MED ORDER — PROMETHAZINE HCL 6.25 MG/5ML PO SYRP: 6.2500 mg | ORAL_SOLUTION | Freq: Three times a day (TID) | ORAL | Status: DC | PRN

## 2016-02-16 NOTE — Progress Notes (Signed)
  HPI  Patient presents to the office for evaluation of cough fever and sinus pressure.  It has been going on for 3 days.  Patient reports night > day, dry, worse with lying down.  They also endorse change in voice, chills, fever, postnasal drip and nasal congestion, sore throat, bilateral ear pain, and headache.  .  They have tried tylenol.  They report that nothing has worked.  They admits to other sick contacts.  A lot of her coworkers are currently sick at work.     Review of Systems  Constitutional: Positive for fever, chills and malaise/fatigue.  HENT: Positive for congestion, ear pain and sore throat.   Respiratory: Positive for cough. Negative for shortness of breath and wheezing.   Cardiovascular: Negative for chest pain, palpitations and leg swelling.  Neurological: Positive for headaches.    PE:  Filed Vitals:   02/16/16 1632  BP: 166/90  Pulse: 70  Temp: 98 F (36.7 C)  Resp: 18    General:  Alert and non-toxic, WDWN, NAD HEENT: NCAT, PERLA, EOM normal, no occular discharge or erythema.  Nasal mucosal edema with sinus tenderness to palpation.  Oropharynx clear with minimal oropharyngeal edema and erythema.  Mucous membranes moist and pink. Neck:  Cervical adenopathy Chest:  RRR no MRGs.  Lungs clear to auscultation A&P with no wheezes rhonchi or rales.   Abdomen: +BS x 4 quadrants, soft, non-tender, no guarding, rigidity, or rebound. Skin: warm and dry no rash Neuro: A&Ox4, CN II-XII grossly intact  Assessment and Plan:   1. Acute recurrent pansinusitis -netti pot -nasacort -allegra -benadryl prn - predniSONE (DELTASONE) 20 MG tablet; 3 tabs po day one, then 2 tabs daily x 4 days  Dispense: 11 tablet; Refill: 0 - azithromycin (ZITHROMAX Z-PAK) 250 MG tablet; 2 po day one, then 1 daily x 4 days  Dispense: 6 tablet; Refill: 1 - promethazine (PHENERGAN) 6.25 MG/5ML syrup; Take 5 mLs (6.25 mg total) by mouth every 8 (eight) hours as needed for nausea or vomiting.   Dispense: 473 mL; Refill: 0

## 2016-02-16 NOTE — Patient Instructions (Signed)
Please take zpak until it is gone.    Please use netti pot twice daily until this resolves.  Please drink plenty of fluids.  Please get plenty of rest.   You can use benadryl 50 mg at bedtime.  Please take the prednisone until it is gone.  Please take the phenergan syrup up to three times daily.  Use nasacort daily until this resolves.  Please take your allegra daily.

## 2016-02-18 ENCOUNTER — Ambulatory Visit: Payer: Self-pay | Admitting: Physician Assistant

## 2016-03-02 ENCOUNTER — Other Ambulatory Visit: Payer: Self-pay | Admitting: Oncology

## 2016-03-02 ENCOUNTER — Encounter: Payer: Self-pay | Admitting: Oncology

## 2016-03-02 NOTE — Progress Notes (Signed)
I left message for patient to have forms faxed to me and it takes 3-5 business days for completion and if they include fax# I will get bk to them and make copy for her records

## 2016-03-03 ENCOUNTER — Ambulatory Visit (HOSPITAL_BASED_OUTPATIENT_CLINIC_OR_DEPARTMENT_OTHER): Payer: 59 | Admitting: Oncology

## 2016-03-03 ENCOUNTER — Encounter: Payer: Self-pay | Admitting: Oncology

## 2016-03-03 ENCOUNTER — Telehealth: Payer: Self-pay | Admitting: Oncology

## 2016-03-03 ENCOUNTER — Other Ambulatory Visit (HOSPITAL_BASED_OUTPATIENT_CLINIC_OR_DEPARTMENT_OTHER): Payer: 59

## 2016-03-03 VITALS — BP 145/61 | HR 63 | Temp 97.8°F | Resp 18 | Ht 62.5 in | Wt 207.9 lb

## 2016-03-03 DIAGNOSIS — D801 Nonfamilial hypogammaglobulinemia: Secondary | ICD-10-CM

## 2016-03-03 DIAGNOSIS — Z6837 Body mass index (BMI) 37.0-37.9, adult: Secondary | ICD-10-CM

## 2016-03-03 DIAGNOSIS — J3089 Other allergic rhinitis: Secondary | ICD-10-CM | POA: Diagnosis not present

## 2016-03-03 DIAGNOSIS — C9110 Chronic lymphocytic leukemia of B-cell type not having achieved remission: Secondary | ICD-10-CM

## 2016-03-03 LAB — CBC WITH DIFFERENTIAL/PLATELET
BASO%: 0.4 % (ref 0.0–2.0)
Basophils Absolute: 0.3 10*3/uL — ABNORMAL HIGH (ref 0.0–0.1)
EOS ABS: 0.2 10*3/uL (ref 0.0–0.5)
EOS%: 0.2 % (ref 0.0–7.0)
HEMATOCRIT: 37.1 % (ref 34.8–46.6)
HEMOGLOBIN: 11.8 g/dL (ref 11.6–15.9)
LYMPH#: 81.4 10*3/uL — AB (ref 0.9–3.3)
LYMPH%: 88.8 % — AB (ref 14.0–49.7)
MCH: 31.1 pg (ref 25.1–34.0)
MCHC: 31.9 g/dL (ref 31.5–36.0)
MCV: 97.6 fL (ref 79.5–101.0)
MONO#: 2.4 10*3/uL — AB (ref 0.1–0.9)
MONO%: 2.6 % (ref 0.0–14.0)
NEUT%: 8 % — ABNORMAL LOW (ref 38.4–76.8)
NEUTROS ABS: 7.3 10*3/uL — AB (ref 1.5–6.5)
PLATELETS: 130 10*3/uL — AB (ref 145–400)
RBC: 3.8 10*6/uL (ref 3.70–5.45)
RDW: 14.7 % — ABNORMAL HIGH (ref 11.2–14.5)
WBC: 91.7 10*3/uL — AB (ref 3.9–10.3)

## 2016-03-03 LAB — COMPREHENSIVE METABOLIC PANEL
ALBUMIN: 4.1 g/dL (ref 3.5–5.0)
ALK PHOS: 62 U/L (ref 40–150)
ALT: 18 U/L (ref 0–55)
ANION GAP: 9 meq/L (ref 3–11)
AST: 25 U/L (ref 5–34)
BUN: 20.7 mg/dL (ref 7.0–26.0)
CALCIUM: 9.6 mg/dL (ref 8.4–10.4)
CO2: 24 meq/L (ref 22–29)
CREATININE: 0.9 mg/dL (ref 0.6–1.1)
Chloride: 103 mEq/L (ref 98–109)
EGFR: 63 mL/min/{1.73_m2} — AB (ref >90)
Glucose: 99 mg/dl (ref 70–140)
Potassium: 3.9 mEq/L (ref 3.5–5.1)
Sodium: 136 mEq/L (ref 136–145)
TOTAL PROTEIN: 7.2 g/dL (ref 6.4–8.3)
Total Bilirubin: 0.5 mg/dL (ref 0.20–1.20)

## 2016-03-03 NOTE — Progress Notes (Signed)
OFFICE PROGRESS NOTE   March 03, 2016   Physicians: (J.Granfortuna, N.Gorsuch), W.McKeown, Shelva Majestic, A.Ross (gyn), F.Lupton  INTERVAL HISTORY:  Patient is seen, alone for visit, in continuing attention to CLL. She has received IVIG thru this winter due to previous respiratory infections, but is not otherwise on treatment since diagnosis 11-2008. She does not have unfavorable p53 or 17p deletion. IVIG was given 09-18-15, 11-06-15 and 01-21-16, with no significant complications.  Patient has had a single episode of sinusitis in past 6 months, that x 3 days only in mid March when multiple co-workers were  Ill with more prolonged symptoms, resolved with Z pack by Dr Melford Aase. She has no symptoms of infection now, tho has tended to develop sinus infections when environmental allergies have flared in spring, so would like the IVIG again this spring. She is otherwise feeling fairly well/ at baseline. She has started walking 10 minutes at a time 3x weekly for past 2 weeks, is fatigued with this but is motivated to continue as she is very deconditioned. She denies any symptomatic adenopathy, fevers or sweats, bleeding, abdominal pain, SOB at rest. Appetite is good, trying to improve diet.  She had skin biopsy left nose today by dermatology and was given cream for skin area left areola.  Remainder of 10 point Review of Systems negative.    No PAC Flu vaccine 08-13-15 Prevnar vaccine 09-23-15  She has brought papers for intermittent FMLA to [employer], needs option of 7-10 days/ month   ONCOLOGIC HISTORY Patient was diagnosed with stage I CLL in 11-2008 by biopsy of left axillary lymph node found on routine mammograms. She has not required treatment for the CLL, has had fluctuations in WBC at times related to intermittent steroids used for recurrent sinusitis and bronchitis. Cytogenetics 06-03-14 with 13q- present, del17p and TP53 not present She has not had bulky adenopathy, previous anemia or persistent  thrombocytopenia. She had had numerous infections, mostly respiratory, with low immune globulins. She began prophylactic IVIG 10-03-14, used for several months thru winter also 2016-17 with much less upper and lower respiratory infections.   Objective:  Vital signs in last 24 hours:  BP 145/61 mmHg  Pulse 63  Temp(Src) 97.8 F (36.6 C) (Oral)  Resp 18  Ht 5' 2.5" (1.588 m)  Wt 207 lb 14.4 oz (94.303 kg)  BMI 37.40 kg/m2  SpO2 100% Weight up 4 lbs Alert, oriented and appropriate. Ambulatory without difficulty. Looks comfortable, not congested, no cough,  Respirations not labored RA  HEENT:PERRL, sclerae not icteric. Oral mucosa moist without lesions, posterior pharynx clear.  Neck supple. No JVD.  Lymphatics:minimal soft bilateral cervical nodes up to 2 cm; no suraclavicular, axillary or inguinal adenopathy Resp: clear to auscultation bilaterally and normal percussion bilaterally Cardio: regular rate and rhythm. No gallop. ZH:YQMVHQI obese,  soft, nontender, not distended, no mass or organomegaly. Normally active bowel sounds.  Musculoskeletal/ Extremities: without pitting edema, cords, tenderness Neuro: no peripheral neuropathy. Otherwise nonfocal. PSYCH appropriate mood and affect Skin without rash, ecchymosis, petechiae Breasts: bilaterally without dominant mass. 0.5x 1 cm slightly scaly erythematous area of skin left areola.[name] Kitchen Axillae benign.   Lab Results:  Results for orders placed or performed in visit on 03/03/16  CBC with Differential  Result Value Ref Range   WBC 91.7 (HH) 3.9 - 10.3 10e3/uL   NEUT# 7.3 (H) 1.5 - 6.5 10e3/uL   HGB 11.8 11.6 - 15.9 g/dL   HCT 37.1 34.8 - 46.6 %   Platelets 130 (L) 145 -  400 10e3/uL   MCV 97.6 79.5 - 101.0 fL   MCH 31.1 25.1 - 34.0 pg   MCHC 31.9 31.5 - 36.0 g/dL   RBC 3.80 3.70 - 5.45 10e6/uL   RDW 14.7 (H) 11.2 - 14.5 %   lymph# 81.4 (H) 0.9 - 3.3 10e3/uL   MONO# 2.4 (H) 0.1 - 0.9 10e3/uL   Eosinophils Absolute 0.2 0.0 - 0.5  10e3/uL   Basophils Absolute 0.3 (H) 0.0 - 0.1 10e3/uL   NEUT% 8.0 (L) 38.4 - 76.8 %   LYMPH% 88.8 (H) 14.0 - 49.7 %   MONO% 2.6 0.0 - 14.0 %   EOS% 0.2 0.0 - 7.0 %   BASO% 0.4 0.0 - 2.0 %  Comprehensive metabolic panel  Result Value Ref Range   Sodium 136 136 - 145 mEq/L   Potassium 3.9 3.5 - 5.1 mEq/L   Chloride 103 98 - 109 mEq/L   CO2 24 22 - 29 mEq/L   Glucose 99 70 - 140 mg/dl   BUN 20.7 7.0 - 26.0 mg/dL   Creatinine 0.9 0.6 - 1.1 mg/dL   Total Bilirubin 0.50 0.20 - 1.20 mg/dL   Alkaline Phosphatase 62 40 - 150 U/L   AST 25 5 - 34 U/L   ALT 18 0 - 55 U/L   Total Protein 7.2 6.4 - 8.3 g/dL   Albumin 4.1 3.5 - 5.0 g/dL   Calcium 9.6 8.4 - 10.4 mg/dL   Anion Gap 9 3 - 11 mEq/L   EGFR 63 (L) >90 ml/min/1.73 m2     Studies/Results: Bilateral mammograms [facility] 01-23-16 predominately fatty and no findings of concern, 1 year follow up recommended  Medications: I have reviewed the patient's current medications. Will repeat IVIG in April   DISCUSSION Clinically doing well, overall better with IVIG, no B symptoms and nothing of concern from standpoint of CLL on exam. Platelets are better today than last 2 CBCs,  WBC trending up but neutrophils still good and lymphocyte # has not doubled in last 6 months. Patient is very anxious about considering any treatment for the CLL, so that I have again tried to prepare her for this possibility as we have discussed situation today. We are pleased that platelets are improved and nothing else dramatically different today. Will give IVIG ~ 03-11-16 and I will see her with labs in ~ 2 months (NOTE Dr Alvy Bimler would consider Gazyva/ obinituzumab and Dr Beryle Beams would consider Ibrutinib if needs treatment).  She will let MD know if irritated area left areola does not resolve with topical by dermatology. Mammograms done 01-23-16 not remarkable   Assessment/Plan: 1.CLL with hypogammaglobulinemia: CLL initially diagnosed 2009, no unfavorable  cytogenetics. Only intervention thus far has been intermittent IVIG, clinically very helpful. No clear indication requiring systemic treatment now, following. Last imaging was CT AP 02-2014 2.brief episode of sinusitis/ URI in mid March, resolved 3.deconditioning, poor exercise tolerance. Has started walking "10 min 3x weekly" which I have encouraged her to gradually increase 4.erythematous area left areola: to begin cream per dermatology, plan otherwise as above 5.obesity: needs exercise, BMI 37 6.hepatitis A 7.HTN, elevated cholesterol followed by Dr Melford Aase 8.obstructive sleep apnea for which she uses CPAP 9.GERD controlled 10.flu vaccine 08-13-15    All questions answered. IVIG orders placed. Time spent 25 min including >50% counseling and coordination of care.   Rebecca Bond P, MD   03/03/2016, 4:55 PM

## 2016-03-03 NOTE — Telephone Encounter (Signed)
Left message to inform patient of infusion appt 4/21 and fu appt 5/22 per LL 3/30 pof

## 2016-03-04 ENCOUNTER — Encounter: Payer: Self-pay | Admitting: Oncology

## 2016-03-04 NOTE — Progress Notes (Signed)
patient gave me forms and I left for dr Marko Plume to sign

## 2016-03-07 ENCOUNTER — Encounter: Payer: Self-pay | Admitting: Oncology

## 2016-03-07 NOTE — Progress Notes (Signed)
faxed [phone] and mailed to patient her copy

## 2016-03-10 ENCOUNTER — Ambulatory Visit (INDEPENDENT_AMBULATORY_CARE_PROVIDER_SITE_OTHER): Payer: 59 | Admitting: Physician Assistant

## 2016-03-10 ENCOUNTER — Encounter: Payer: Self-pay | Admitting: Physician Assistant

## 2016-03-10 VITALS — BP 138/66 | HR 84 | Temp 97.3°F | Resp 16 | Ht 62.5 in | Wt 211.6 lb

## 2016-03-10 DIAGNOSIS — I10 Essential (primary) hypertension: Secondary | ICD-10-CM | POA: Diagnosis not present

## 2016-03-10 DIAGNOSIS — F411 Generalized anxiety disorder: Secondary | ICD-10-CM | POA: Diagnosis not present

## 2016-03-10 NOTE — Progress Notes (Signed)
Assessment and Plan: Anxiety- continue xanax PRN, suggest counsesling.  HTN- may need ziac 10, monitor BP Obesity- had friend pass recently, may be interested in nutritional visit.    HPI 70 y.o.female presents for follow up. She was seen for 3 month OV 02/2014, was having atypical chest pain at that time, normal stress test 2014, normal EKG in the office, given xanax 1/2 pill PRN for increasing stress at work and home and is here for follow up. She was taking the xanax 1/2 pill daily for 5 days but is just taking it as needed now.  She has since see Dr. Marko Plume for [name] and is schedule for a transfusion on the 21 of this month. She was treated for a sinus infection recently with zpak, and has had BX of area on her nose that was cancer on her nose, planning on going back for a surgery. Her bystolic 30mg  was switched to ziac 5mg , she has not switched yet.   Past Medical History  Diagnosis Date  . Benign essential HTN 11/22/2011  . CLL (chronic lymphocytic leukemia) ([place])   . Pneumonia 01/2010  . History of bronchitis 08/02/2012    "maybe once/yr if that much; last time 01/2010"  . Recurrent sinus infections 08/02/2012  . 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 High cholesterol   . 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 Prediabetes   . Anxiety   . Depression   . GERD (gastroesophageal reflux disease)   . OSA (obstructive sleep apnea)       Current Outpatient Prescriptions on File Prior to Visit  Medication Sig Dispense Refill  . ALPRAZolam (XANAX) 1 MG tablet TAKE 1/2 TO 1 TABLET BY MOUTH 3 TIMES A DAY AS NEEDED FOR ANXIETY 90 tablet 1  . Ascorbic Acid (VITAMIN C) 1000 MG tablet Take 1,000 mg by mouth daily.    . B Complex Vitamins (VITAMIN-B COMPLEX) TABS Take 1 tablet by mouth daily.    [name] Kitchen buPROPion (WELLBUTRIN XL) 300 MG 24 hr tablet Take 1 tablet every morning for mood & depression 90 tablet 1  . Cholecalciferol (VITAMIN D PO) Take 10,000 Units by mouth  daily.     [name] Kitchen ezetimibe (ZETIA) 10 MG tablet Take 1 tablet (10 mg total) by mouth at bedtime. 30 tablet 6  . fexofenadine (ALLEGRA) 180 MG tablet Take 1 tablet (180 mg total) by mouth daily. Take PRN for allergies. 30 tablet 6  . FLORASTOR 250 MG capsule   1  . hydrochlorothiazide (HYDRODIURIL) 12.5 MG tablet TAKE 1 TABLET BY MOUTH ONCE DAILY (Patient taking differently: TAKE 1 TABLET BY MOUTH every other day) 90 tablet 1  . nebivolol (BYSTOLIC) 10 MG tablet Take 3 tablets (30 mg total) by mouth daily. <PLEASE MAKE APPOINTMENT FOR REFILLS> 90 tablet 0  . OVER THE COUNTER MEDICATION Nasocort daily as needed    . OVER THE COUNTER MEDICATION Sustain Ultra Eye drops    . OVER THE COUNTER MEDICATION Calcium 250 mg/Magnesium 100 mg 2 tabs daily    . pantoprazole (PROTONIX) 40 MG tablet Take 1 tablet (40 mg total) by mouth daily. 30 tablet 6  . predniSONE (DELTASONE) 20 MG tablet 3 tabs po day one, then 2 tabs daily x 4 days (Patient not taking: Reported on 03/03/2016) 11 tablet 0  . promethazine (PHENERGAN) 6.25 MG/5ML syrup Take 5 mLs (6.25 mg total) by mouth every 8 (eight) hours as needed for nausea or vomiting. (Patient not taking: Reported on 03/03/2016)  473 mL 0   No current facility-administered medications on file prior to visit.    ROS: all negative except above.   Physical Exam: Filed Weights   03/10/16 1617  Weight: 211 lb 9.6 oz (95.981 kg)   BP 138/66 mmHg  Pulse 84  Temp(Src) 97.3 F (36.3 C) (Temporal)  Resp 16  Ht 5' 2.5" (1.588 m)  Wt 211 lb 9.6 oz (95.981 kg)  BMI 38.06 kg/m2  SpO2 98% General Appearance: Well nourished, in no apparent distress. Eyes: PERRLA, EOMs, conjunctiva no swelling or erythema Sinuses: No Frontal/maxillary tenderness ENT/Mouth: Ext aud canals clear, TMs without erythema, bulging. No erythema, swelling, or exudate on post pharynx.  Tonsils not swollen or erythematous. Hearing normal.  Neck: Supple, thyroid normal.  Respiratory: Respiratory effort  normal, BS equal bilaterally without rales, rhonchi, wheezing or stridor.  Cardio: RRR with no MRGs. Brisk peripheral pulses without edema.  Abdomen: Soft, + BS.  Non tender, no guarding, rebound, hernias, masses. Lymphatics: Non tender without lymphadenopathy.  Musculoskeletal: Full ROM, 5/5 strength, normal gait.  Skin: Warm, dry without rashes, lesions, ecchymosis.  Neuro: Cranial nerves intact. Normal muscle tone, no cerebellar symptoms. Sensation intact.  Psych: Awake and oriented X 3, normal affect, Insight and Judgment appropriate.     Vicie Mutters, PA-C 4:27 PM [facility]

## 2016-03-10 NOTE — Patient Instructions (Addendum)
Dasani sparking water [place]  Try lime, lemon, or orange/apricot  Low Back Sprain With Rehab A sprain is an injury in which a ligament is torn. The ligaments of the lower back are vulnerable to sprains. However, they are strong and require great force to be injured. These ligaments are important for stabilizing the spinal column. Sprains are classified into three categories. Grade 1 sprains cause pain, but the tendon is not lengthened. Grade 2 sprains include a lengthened ligament, due to the ligament being stretched or partially ruptured. With grade 2 sprains there is still function, although the function may be decreased. Grade 3 sprains involve a complete tear of the tendon or muscle, and function is usually impaired. SYMPTOMS   Severe pain in the lower back.  Sometimes, a feeling of a "pop," "snap," or tear, at the time of injury.  Tenderness and sometimes swelling at the injury site.  Uncommonly, bruising (contusion) within 48 hours of injury.  Muscle spasms in the back. CAUSES  Low back sprains occur when a force is placed on the ligaments that is greater than they can handle. Common causes of injury include:  Performing a stressful act while off-balance.  Repetitive stressful activities that involve movement of the lower back.  Direct hit (trauma) to the lower back. RISK INCREASES WITH:  Contact sports (football, wrestling).  Collisions (major skiing accidents).  Sports that require throwing or lifting (baseball, weightlifting).  Sports involving twisting of the spine (gymnastics, diving, tennis, golf).  Poor strength and flexibility.  Inadequate protection.  Previous back injury or surgery (especially fusion). PREVENTION  Wear properly fitted and padded protective equipment.  Warm up and stretch properly before activity.  Allow for adequate recovery between workouts.  Maintain physical fitness:  Strength, flexibility, and endurance.  Cardiovascular  fitness.  Maintain a healthy body weight. PROGNOSIS  If treated properly, low back sprains usually heal with non-surgical treatment. The length of time for healing depends on the severity of the injury.  RELATED COMPLICATIONS   Recurring symptoms, resulting in a chronic problem.  Chronic inflammation and pain in the low back.  Delayed healing or resolution of symptoms, especially if activity is resumed too soon.  Prolonged impairment.  Unstable or arthritic joints of the low back. TREATMENT  Treatment first involves the use of ice and medicine, to reduce pain and inflammation. The use of strengthening and stretching exercises may help reduce pain with activity. These exercises may be performed at home or with a therapist. Severe injuries may require referral to a therapist for further evaluation and treatment, such as ultrasound. Your caregiver may advise that you wear a back brace or corset, to help reduce pain and discomfort. Often, prolonged bed rest results in greater harm then benefit. Corticosteroid injections may be recommended. However, these should be reserved for the most serious cases. It is important to avoid using your back when lifting objects. At night, sleep on your back on a firm mattress, with a pillow placed under your knees. If non-surgical treatment is unsuccessful, surgery may be needed.  MEDICATION   If pain medicine is needed, nonsteroidal anti-inflammatory medicines (aspirin and ibuprofen), or other minor pain relievers (acetaminophen), are often advised.  Do not take pain medicine for 7 days before surgery.  Prescription pain relievers may be given, if your caregiver thinks they are needed. Use only as directed and only as much as you need.  Ointments applied to the skin may be helpful.  Corticosteroid injections may be given by  your caregiver. These injections should be reserved for the most serious cases, because they may only be given a certain number of  times. HEAT AND COLD  Cold treatment (icing) should be applied for 10 to 15 minutes every 2 to 3 hours for inflammation and pain, and immediately after activity that aggravates your symptoms. Use ice packs or an ice massage.  Heat treatment may be used before performing stretching and strengthening activities prescribed by your caregiver, physical therapist, or athletic trainer. Use a heat pack or a warm water soak. SEEK MEDICAL CARE IF:   Symptoms get worse or do not improve in 2 to 4 weeks, despite treatment.  You develop numbness or weakness in either leg.  You lose bowel or bladder function.  Any of the following occur after surgery: fever, increased pain, swelling, redness, drainage of fluids, or bleeding in the affected area.  New, unexplained symptoms develop. (Drugs used in treatment may produce side effects.) EXERCISES  RANGE OF MOTION (ROM) AND STRETCHING EXERCISES - Low Back Sprain Most people with lower back pain will find that their symptoms get worse with excessive bending forward (flexion) or arching at the lower back (extension). The exercises that will help resolve your symptoms will focus on the opposite motion.  Your physician, physical therapist or athletic trainer will help you determine which exercises will be most helpful to resolve your lower back pain. Do not complete any exercises without first consulting with your caregiver. Discontinue any exercises which make your symptoms worse, until you speak to your caregiver. If you have pain, numbness or tingling which travels down into your buttocks, leg or foot, the goal of the therapy is for these symptoms to move closer to your back and eventually resolve. Sometimes, these leg symptoms will get better, but your lower back pain may worsen. This is often an indication of progress in your rehabilitation. Be very alert to any changes in your symptoms and the activities in which you participated in the 24 hours prior to the  change. Sharing this information with your caregiver will allow him or her to most efficiently treat your condition. These exercises may help you when beginning to rehabilitate your injury. Your symptoms may resolve with or without further involvement from your physician, physical therapist or athletic trainer. While completing these exercises, remember:   Restoring tissue flexibility helps normal motion to return to the joints. This allows healthier, less painful movement and activity.  An effective stretch should be held for at least 30 seconds.  A stretch should never be painful. You should only feel a gentle lengthening or release in the stretched tissue. FLEXION RANGE OF MOTION AND STRETCHING EXERCISES: STRETCH - Flexion, Single Knee to Chest   Lie on a firm bed or floor with both legs extended in front of you.  Keeping one leg in contact with the floor, bring your opposite knee to your chest. Hold your leg in place by either grabbing behind your thigh or at your knee.  Pull until you feel a gentle stretch in your low back. Hold __________ seconds.  Slowly release your grasp and repeat the exercise with the opposite side. Repeat __________ times. Complete this exercise __________ times per day.  STRETCH - Flexion, Double Knee to Chest  Lie on a firm bed or floor with both legs extended in front of you.  Keeping one leg in contact with the floor, bring your opposite knee to your chest.  Tense your stomach muscles to support your back  and then lift your other knee to your chest. Hold your legs in place by either grabbing behind your thighs or at your knees.  Pull both knees toward your chest until you feel a gentle stretch in your low back. Hold __________ seconds.  Tense your stomach muscles and slowly return one leg at a time to the floor. Repeat __________ times. Complete this exercise __________ times per day.  STRETCH - Low Trunk Rotation  Lie on a firm bed or floor. Keeping  your legs in front of you, bend your knees so they are both pointed toward the ceiling and your feet are flat on the floor.  Extend your arms out to the side. This will stabilize your upper body by keeping your shoulders in contact with the floor.  Gently and slowly drop both knees together to one side until you feel a gentle stretch in your low back. Hold for __________ seconds.  Tense your stomach muscles to support your lower back as you bring your knees back to the starting position. Repeat the exercise to the other side. Repeat __________ times. Complete this exercise __________ times per day  EXTENSION RANGE OF MOTION AND FLEXIBILITY EXERCISES: STRETCH - Extension, Prone on Elbows   Lie on your stomach on the floor, a bed will be too soft. Place your palms about shoulder width apart and at the height of your head.  Place your elbows under your shoulders. If this is too painful, stack pillows under your chest.  Allow your body to relax so that your hips drop lower and make contact more completely with the floor.  Hold this position for __________ seconds.  Slowly return to lying flat on the floor. Repeat __________ times. Complete this exercise __________ times per day.  RANGE OF MOTION - Extension, Prone Press Ups  Lie on your stomach on the floor, a bed will be too soft. Place your palms about shoulder width apart and at the height of your head.  Keeping your back as relaxed as possible, slowly straighten your elbows while keeping your hips on the floor. You may adjust the placement of your hands to maximize your comfort. As you gain motion, your hands will come more underneath your shoulders.  Hold this position __________ seconds.  Slowly return to lying flat on the floor. Repeat __________ times. Complete this exercise __________ times per day.  RANGE OF MOTION- Quadruped, Neutral Spine   Assume a hands and knees position on a firm surface. Keep your hands under your  shoulders and your knees under your hips. You may place padding under your knees for comfort.  Drop your head and point your tailbone toward the ground below you. This will round out your lower back like an angry cat. Hold this position for __________ seconds.  Slowly lift your head and release your tail bone so that your back sags into a large arch, like an old horse.  Hold this position for __________ seconds.  Repeat this until you feel limber in your low back.  Now, find your "sweet spot." This will be the most comfortable position somewhere between the two previous positions. This is your neutral spine. Once you have found this position, tense your stomach muscles to support your low back.  Hold this position for __________ seconds. Repeat __________ times. Complete this exercise __________ times per day.  STRENGTHENING EXERCISES - Low Back Sprain These exercises may help you when beginning to rehabilitate your injury. These exercises should be done near your "  sweet spot." This is the neutral, low-back arch, somewhere between fully rounded and fully arched, that is your least painful position. When performed in this safe range of motion, these exercises can be used for people who have either a flexion or extension based injury. These exercises may resolve your symptoms with or without further involvement from your physician, physical therapist or athletic trainer. While completing these exercises, remember:   Muscles can gain both the endurance and the strength needed for everyday activities through controlled exercises.  Complete these exercises as instructed by your physician, physical therapist or athletic trainer. Increase the resistance and repetitions only as guided.  You may experience muscle soreness or fatigue, but the pain or discomfort you are trying to eliminate should never worsen during these exercises. If this pain does worsen, stop and make certain you are following the  directions exactly. If the pain is still present after adjustments, discontinue the exercise until you can discuss the trouble with your caregiver. STRENGTHENING - Deep Abdominals, Pelvic Tilt   Lie on a firm bed or floor. Keeping your legs in front of you, bend your knees so they are both pointed toward the ceiling and your feet are flat on the floor.  Tense your lower abdominal muscles to press your low back into the floor. This motion will rotate your pelvis so that your tail bone is scooping upwards rather than pointing at your feet or into the floor. With a gentle tension and even breathing, hold this position for __________ seconds. Repeat __________ times. Complete this exercise __________ times per day.  STRENGTHENING - Abdominals, Crunches   Lie on a firm bed or floor. Keeping your legs in front of you, bend your knees so they are both pointed toward the ceiling and your feet are flat on the floor. Cross your arms over your chest.  Slightly tip your chin down without bending your neck.  Tense your abdominals and slowly lift your trunk high enough to just clear your shoulder blades. Lifting higher can put excessive stress on the lower back and does not further strengthen your abdominal muscles.  Control your return to the starting position. Repeat __________ times. Complete this exercise __________ times per day.  STRENGTHENING - Quadruped, Opposite UE/LE Lift   Assume a hands and knees position on a firm surface. Keep your hands under your shoulders and your knees under your hips. You may place padding under your knees for comfort.  Find your neutral spine and gently tense your abdominal muscles so that you can maintain this position. Your shoulders and hips should form a rectangle that is parallel with the floor and is not twisted.  Keeping your trunk steady, lift your right hand no higher than your shoulder and then your left leg no higher than your hip. Make sure you are not  holding your breath. Hold this position for __________ seconds.  Continuing to keep your abdominal muscles tense and your back steady, slowly return to your starting position. Repeat with the opposite arm and leg. Repeat __________ times. Complete this exercise __________ times per day.  STRENGTHENING - Abdominals and Quadriceps, Straight Leg Raise   Lie on a firm bed or floor with both legs extended in front of you.  Keeping one leg in contact with the floor, bend the other knee so that your foot can rest flat on the floor.  Find your neutral spine, and tense your abdominal muscles to maintain your spinal position throughout the exercise.  Slowly lift  your straight leg off the floor about 6 inches for a count of 15, making sure to not hold your breath.  Still keeping your neutral spine, slowly lower your leg all the way to the floor. Repeat this exercise with each leg __________ times. Complete this exercise __________ times per day. POSTURE AND BODY MECHANICS CONSIDERATIONS - Low Back Sprain Keeping correct posture when sitting, standing or completing your activities will reduce the stress put on different body tissues, allowing injured tissues a chance to heal and limiting painful experiences. The following are general guidelines for improved posture. Your physician or physical therapist will provide you with any instructions specific to your needs. While reading these guidelines, remember:  The exercises prescribed by your provider will help you have the flexibility and strength to maintain correct postures.  The correct posture provides the best environment for your joints to work. All of your joints have less wear and tear when properly supported by a spine with good posture. This means you will experience a healthier, less painful body.  Correct posture must be practiced with all of your activities, especially prolonged sitting and standing. Correct posture is as important when doing  repetitive low-stress activities (typing) as it is when doing a single heavy-load activity (lifting). RESTING POSITIONS Consider which positions are most painful for you when choosing a resting position. If you have pain with flexion-based activities (sitting, bending, stooping, squatting), choose a position that allows you to rest in a less flexed posture. You would want to avoid curling into a fetal position on your side. If your pain worsens with extension-based activities (prolonged standing, working overhead), avoid resting in an extended position such as sleeping on your stomach. Most people will find more comfort when they rest with their spine in a more neutral position, neither too rounded nor too arched. Lying on a non-sagging bed on your side with a pillow between your knees, or on your back with a pillow under your knees will often provide some relief. Keep in mind, being in any one position for a prolonged period of time, no matter how correct your posture, can still lead to stiffness. PROPER SITTING POSTURE In order to minimize stress and discomfort on your spine, you must sit with correct posture. Sitting with good posture should be effortless for a healthy body. Returning to good posture is a gradual process. Many people can work toward this most comfortably by using various supports until they have the flexibility and strength to maintain this posture on their own. When sitting with proper posture, your ears will fall over your shoulders and your shoulders will fall over your hips. You should use the back of the chair to support your upper back. Your lower back will be in a neutral position, just slightly arched. You may place a small pillow or folded towel at the base of your lower back for  support.  When working at a desk, create an environment that supports good, upright posture. Without extra support, muscles tire, which leads to excessive strain on joints and other tissues. Keep these  recommendations in mind: CHAIR:  A chair should be able to slide under your desk when your back makes contact with the back of the chair. This allows you to work closely.  The chair's height should allow your eyes to be level with the upper part of your monitor and your hands to be slightly lower than your elbows. BODY POSITION  Your feet should make contact with the floor.  If this is not possible, use a foot rest.  Keep your ears over your shoulders. This will reduce stress on your neck and low back. INCORRECT SITTING POSTURES  If you are feeling tired and unable to assume a healthy sitting posture, do not slouch or slump. This puts excessive strain on your back tissues, causing more damage and pain. Healthier options include:  Using more support, like a lumbar pillow.  Switching tasks to something that requires you to be upright or walking.  Talking a brief walk.  Lying down to rest in a neutral-spine position. PROLONGED STANDING WHILE SLIGHTLY LEANING FORWARD  When completing a task that requires you to lean forward while standing in one place for a long time, place either foot up on a stationary 2-4 inch high object to help maintain the best posture. When both feet are on the ground, the lower back tends to lose its slight inward curve. If this curve flattens (or becomes too large), then the back and your other joints will experience too much stress, tire more quickly, and can cause pain. CORRECT STANDING POSTURES Proper standing posture should be assumed with all daily activities, even if they only take a few moments, like when brushing your teeth. As in sitting, your ears should fall over your shoulders and your shoulders should fall over your hips. You should keep a slight tension in your abdominal muscles to brace your spine. Your tailbone should point down to the ground, not behind your body, resulting in an over-extended swayback posture.  INCORRECT STANDING POSTURES  Common  incorrect standing postures include a forward head, locked knees and/or an excessive swayback. WALKING Walk with an upright posture. Your ears, shoulders and hips should all line-up. PROLONGED ACTIVITY IN A FLEXED POSITION When completing a task that requires you to bend forward at your waist or lean over a low surface, try to find a way to stabilize 3 out of 4 of your limbs. You can place a hand or elbow on your thigh or rest a knee on the surface you are reaching across. This will provide you more stability, so that your muscles do not tire as quickly. By keeping your knees relaxed, or slightly bent, you will also reduce stress across your lower back. CORRECT LIFTING TECHNIQUES DO :  Assume a wide stance. This will provide you more stability and the opportunity to get as close as possible to the object which you are lifting.  Tense your abdominals to brace your spine. Bend at the knees and hips. Keeping your back locked in a neutral-spine position, lift using your leg muscles. Lift with your legs, keeping your back straight.  Test the weight of unknown objects before attempting to lift them.  Try to keep your elbows locked down at your sides in order get the best strength from your shoulders when carrying an object.  Always ask for help when lifting heavy or awkward objects. INCORRECT LIFTING TECHNIQUES DO NOT:   Lock your knees when lifting, even if it is a small object.  Bend and twist. Pivot at your feet or move your feet when needing to change directions.  Assume that you can safely pick up even a paperclip without proper posture.   This information is not intended to replace advice given to you by your health care provider. Make sure you discuss any questions you have with your health care provider.   Document Released: 11/21/2005 Document Revised: 12/12/2014 Document Reviewed: 03/05/2009 [employer] Interactive Patient Education 2016 [employer]

## 2016-03-18 ENCOUNTER — Telehealth: Payer: Self-pay | Admitting: Oncology

## 2016-03-18 NOTE — Telephone Encounter (Signed)
left message for appt change on 5/22.[name] Kitchen 4:00 ok per provider

## 2016-03-24 ENCOUNTER — Other Ambulatory Visit: Payer: Self-pay

## 2016-03-25 ENCOUNTER — Ambulatory Visit (HOSPITAL_BASED_OUTPATIENT_CLINIC_OR_DEPARTMENT_OTHER): Payer: 59

## 2016-03-25 VITALS — BP 132/51 | HR 66 | Temp 97.7°F | Resp 18

## 2016-03-25 DIAGNOSIS — D801 Nonfamilial hypogammaglobulinemia: Secondary | ICD-10-CM

## 2016-03-25 DIAGNOSIS — C9110 Chronic lymphocytic leukemia of B-cell type not having achieved remission: Secondary | ICD-10-CM | POA: Diagnosis not present

## 2016-03-25 MED ORDER — BISOPROLOL-HYDROCHLOROTHIAZIDE 10-6.25 MG PO TABS: 1.0000 | ORAL_TABLET | Freq: Every day | ORAL | Status: DC

## 2016-03-25 MED ORDER — SODIUM CHLORIDE 0.9 % IV SOLN
Freq: Once | INTRAVENOUS | Status: AC
  Administered 2016-03-25: 09:00:00 via INTRAVENOUS

## 2016-03-25 MED ORDER — ACETAMINOPHEN 325 MG PO TABS
ORAL_TABLET | ORAL | Status: AC
  Filled 2016-03-25: qty 2

## 2016-03-25 MED ORDER — DIPHENHYDRAMINE HCL 25 MG PO CAPS
25.0000 mg | ORAL_CAPSULE | Freq: Once | ORAL | Status: AC
  Administered 2016-03-25: 25 mg via ORAL

## 2016-03-25 MED ORDER — ACETAMINOPHEN 325 MG PO TABS
650.0000 mg | ORAL_TABLET | Freq: Once | ORAL | Status: AC
  Administered 2016-03-25: 650 mg via ORAL

## 2016-03-25 MED ORDER — DIPHENHYDRAMINE HCL 25 MG PO CAPS
ORAL_CAPSULE | ORAL | Status: AC
  Filled 2016-03-25: qty 1

## 2016-03-25 MED ORDER — IMMUNE GLOBULIN (HUMAN) 20 GM/200ML IV SOLN
400.0000 mg/kg | Freq: Once | INTRAVENOUS | Status: AC
  Administered 2016-03-25: 40 g via INTRAVENOUS
  Filled 2016-03-25: qty 400

## 2016-03-25 NOTE — Patient Instructions (Signed)

## 2016-03-26 ENCOUNTER — Encounter: Payer: Self-pay | Admitting: *Deleted

## 2016-03-30 ENCOUNTER — Other Ambulatory Visit: Payer: Self-pay | Admitting: Internal Medicine

## 2016-04-01 ENCOUNTER — Telehealth: Payer: Self-pay | Admitting: *Deleted

## 2016-04-01 NOTE — Telephone Encounter (Signed)
Patient called and asked if she should take both the HCTZ and Ziac.  Per Dr Melford Aase, it is OK to stop the HCTZ, and only take it if she is having ankle swelling.  She is to continue the Ziac daily.  A message was left to inform patient at her request.

## 2016-04-07 ENCOUNTER — Encounter: Payer: Self-pay | Admitting: Oncology

## 2016-04-07 NOTE — Progress Notes (Signed)
revised date to 12/04/16 and refaxed

## 2016-04-07 NOTE — Progress Notes (Signed)
I called to let patient know I was mailing her copy of revised forms I faxed.

## 2016-04-12 ENCOUNTER — Other Ambulatory Visit: Payer: Self-pay | Admitting: Physician Assistant

## 2016-04-12 MED ORDER — PANTOPRAZOLE SODIUM 40 MG PO TBEC: 40.0000 mg | DELAYED_RELEASE_TABLET | Freq: Every day | ORAL | Status: DC

## 2016-04-18 ENCOUNTER — Ambulatory Visit: Payer: Self-pay | Admitting: Internal Medicine

## 2016-04-19 ENCOUNTER — Encounter: Payer: Self-pay | Admitting: Oncology

## 2016-04-19 NOTE — Progress Notes (Signed)
Faxes sent 12/04/15   01/12/16 I sent to medical records

## 2016-04-24 ENCOUNTER — Other Ambulatory Visit: Payer: Self-pay | Admitting: Oncology

## 2016-04-24 DIAGNOSIS — C9110 Chronic lymphocytic leukemia of B-cell type not having achieved remission: Secondary | ICD-10-CM

## 2016-04-25 ENCOUNTER — Other Ambulatory Visit: Payer: 59

## 2016-04-25 ENCOUNTER — Ambulatory Visit (HOSPITAL_BASED_OUTPATIENT_CLINIC_OR_DEPARTMENT_OTHER): Payer: 59 | Admitting: Oncology

## 2016-04-25 ENCOUNTER — Ambulatory Visit: Payer: 59 | Admitting: Oncology

## 2016-04-25 ENCOUNTER — Other Ambulatory Visit (HOSPITAL_BASED_OUTPATIENT_CLINIC_OR_DEPARTMENT_OTHER): Payer: 59

## 2016-04-25 ENCOUNTER — Encounter: Payer: Self-pay | Admitting: Oncology

## 2016-04-25 VITALS — BP 165/72 | HR 61 | Temp 98.6°F | Resp 18 | Ht 62.5 in | Wt 205.7 lb

## 2016-04-25 DIAGNOSIS — D696 Thrombocytopenia, unspecified: Secondary | ICD-10-CM

## 2016-04-25 DIAGNOSIS — Z6837 Body mass index (BMI) 37.0-37.9, adult: Secondary | ICD-10-CM

## 2016-04-25 DIAGNOSIS — D801 Nonfamilial hypogammaglobulinemia: Secondary | ICD-10-CM

## 2016-04-25 DIAGNOSIS — C9110 Chronic lymphocytic leukemia of B-cell type not having achieved remission: Secondary | ICD-10-CM | POA: Diagnosis not present

## 2016-04-25 DIAGNOSIS — D849 Immunodeficiency, unspecified: Secondary | ICD-10-CM | POA: Diagnosis not present

## 2016-04-25 DIAGNOSIS — D899 Disorder involving the immune mechanism, unspecified: Secondary | ICD-10-CM

## 2016-04-25 LAB — CBC WITH DIFFERENTIAL/PLATELET
BASO%: 0.3 % (ref 0.0–2.0)
BASOS ABS: 0.4 10*3/uL — AB (ref 0.0–0.1)
EOS%: 0.2 % (ref 0.0–7.0)
Eosinophils Absolute: 0.3 10*3/uL (ref 0.0–0.5)
HCT: 35.4 % (ref 34.8–46.6)
HEMOGLOBIN: 11.3 g/dL — AB (ref 11.6–15.9)
LYMPH#: 128.6 10*3/uL — AB (ref 0.9–3.3)
LYMPH%: 92.9 % — AB (ref 14.0–49.7)
MCH: 31.2 pg (ref 25.1–34.0)
MCHC: 31.9 g/dL (ref 31.5–36.0)
MCV: 97.8 fL (ref 79.5–101.0)
MONO#: 2.2 10*3/uL — ABNORMAL HIGH (ref 0.1–0.9)
MONO%: 1.6 % (ref 0.0–14.0)
NEUT#: 7 10*3/uL — ABNORMAL HIGH (ref 1.5–6.5)
NEUT%: 5 % — AB (ref 38.4–76.8)
Platelets: 101 10*3/uL — ABNORMAL LOW (ref 145–400)
RBC: 3.62 10*6/uL — ABNORMAL LOW (ref 3.70–5.45)
RDW: 14.1 % (ref 11.2–14.5)
WBC: 138.5 10*3/uL (ref 3.9–10.3)
nRBC: 0 % (ref 0–0)

## 2016-04-25 LAB — COMPREHENSIVE METABOLIC PANEL
ALBUMIN: 3.9 g/dL (ref 3.5–5.0)
ALK PHOS: 63 U/L (ref 40–150)
ALT: 22 U/L (ref 0–55)
AST: 26 U/L (ref 5–34)
Anion Gap: 10 mEq/L (ref 3–11)
BUN: 23.1 mg/dL (ref 7.0–26.0)
CALCIUM: 9.7 mg/dL (ref 8.4–10.4)
CHLORIDE: 100 meq/L (ref 98–109)
CO2: 23 mEq/L (ref 22–29)
Creatinine: 0.8 mg/dL (ref 0.6–1.1)
EGFR: 75 mL/min/{1.73_m2} — AB (ref >90)
GLUCOSE: 96 mg/dL (ref 70–140)
POTASSIUM: 4 meq/L (ref 3.5–5.1)
SODIUM: 132 meq/L — AB (ref 136–145)
Total Bilirubin: 0.45 mg/dL (ref 0.20–1.20)
Total Protein: 6.9 g/dL (ref 6.4–8.3)

## 2016-04-25 LAB — TECHNOLOGIST REVIEW

## 2016-04-25 NOTE — Progress Notes (Signed)
OFFICE PROGRESS NOTE   May 02, 2016   Physicians: (J.Granfortuna, N.Gorsuch), W.McKeown (PCP), Shelva Majestic, A.Ross (gyn), F.Royanne Foots (GI)  INTERVAL HISTORY:  Patient is seen, alone for visit, in continuing attention to CLL.  She has been reluctant to consider treatment, now out over 7 years from diagnosis, but did receive IVIG x3 from 09-2015 thru 03-2016 which seemed to improve her usual frequent wintertime respiratory infections.  CBC today has increase in WBC with ANC still good, stable mild anemia and lower platelets. She is to see Dr Melford Aase 05-30-16 and will have full PE in Sept.  Patient tells me that she has felt the best overall that she has in a very long time, since she recently began exercising regularly at gym and has improved diet. Energy is better and she is now tolerating exercise x 20 min at a time, which is much improved, and intentional weight loss of 4 lbs in last 2 weeks (appetite still good). She has some mild environmental allergy symptoms but no symptoms of respiratory infection of other illness, no SOB with this activity level. She has had no bleeding or bruising, no noted change in adenopathy, no night sweats, no new or different pain. No abdominal discomfort or change in bowels. Some changes made recently in BP meds. Remainder of 10 point Review of Systems negative.    No PAC Flu vaccine 08-13-15 Prevnar vaccine 09-23-15  She and husband will vacation in [place] week of June 4  ONCOLOGIC HISTORY  Patient was diagnosed with stage I CLL in 11-2008 by biopsy of left axillary lymph node found on routine mammograms. She has not required treatment for the CLL, has had fluctuations in WBC at times related to intermittent steroids used for recurrent sinusitis and bronchitis. Cytogenetics 06-03-14 with 13q- present, del17p and TP53 not present She has not had bulky adenopathy, previous anemia or persistent thrombocytopenia. She had numerous infections, mostly  respiratory, with low immune globulins. She began prophylactic IVIG 10-03-14, used for several months thru winter also 2016-17 with much less upper and lower respiratory infections.  Hep B negative 09-2014   Objective:  Vital signs in last 24 hours:  BP 165/72 mmHg  Pulse 61  Temp(Src) 98.6 F (37 C) (Oral)  Resp 18  Ht 5' 2.5" (1.588 m)  Wt 205 lb 11.2 oz (93.305 kg)  BMI 37.00 kg/m2  SpO2 100% Weight down 2 lbs from last visit here. Alert, oriented and appropriate. Ambulatory without difficulty, looks comfortable. Sounds slightly nasally congested, respirations not labored. In good spirits, talkative.   HEENT:PERRL, sclerae not icteric. Oral mucosa moist without lesions, posterior pharynx clear. Nasal turbinates not boggy, no purulent drainage. TMs clear Neck supple. No JVD.  Lymphatics: soft bilateral cervical nodes up to 2 cm nontender. No supraclavicular nodes. No clear axillary or inguinal adenopathy. Resp: clear to auscultation bilaterally and normal percussion bilaterally Cardio: regular rate and rhythm. No gallop. GI: abdomen obese, soft, nontender, not distended, cannot appreciate mass or organomegaly. Normally active bowel sounds.  Musculoskeletal/ Extremities: without pitting edema, cords, tenderness Neuro:  nonfocal  PSYCH appropriate mood and affect, not obviously anxious today Skin  1 cm resolving ecchymosis right upper arm with known trauma, otherwise without ecchymoses, rash, petechiae   Lab Results:  Results for orders placed or performed in visit on 04/25/16  TECHNOLOGIST REVIEW  Result Value Ref Range   Technologist Review Variant lymphs present, smudge cells   CBC with Differential  Result Value Ref Range   WBC 138.5 (  HH) 3.9 - 10.3 10e3/uL   NEUT# 7.0 (H) 1.5 - 6.5 10e3/uL   HGB 11.3 (L) 11.6 - 15.9 g/dL   HCT 35.4 34.8 - 46.6 %   Platelets 101 (L) 145 - 400 10e3/uL   MCV 97.8 79.5 - 101.0 fL   MCH 31.2 25.1 - 34.0 pg   MCHC 31.9 31.5 - 36.0 g/dL    RBC 3.62 (L) 3.70 - 5.45 10e6/uL   RDW 14.1 11.2 - 14.5 %   lymph# 128.6 (H) 0.9 - 3.3 10e3/uL   MONO# 2.2 (H) 0.1 - 0.9 10e3/uL   Eosinophils Absolute 0.3 0.0 - 0.5 10e3/uL   Basophils Absolute 0.4 (H) 0.0 - 0.1 10e3/uL   NEUT% 5.0 (L) 38.4 - 76.8 %   LYMPH% 92.9 (H) 14.0 - 49.7 %   MONO% 1.6 0.0 - 14.0 %   EOS% 0.2 0.0 - 7.0 %   BASO% 0.3 0.0 - 2.0 %   nRBC 0 0 - 0 %  Comprehensive metabolic panel  Result Value Ref Range   Sodium 132 (L) 136 - 145 mEq/L   Potassium 4.0 3.5 - 5.1 mEq/L   Chloride 100 98 - 109 mEq/L   CO2 23 22 - 29 mEq/L   Glucose 96 70 - 140 mg/dl   BUN 23.1 7.0 - 26.0 mg/dL   Creatinine 0.8 0.6 - 1.1 mg/dL   Total Bilirubin 0.45 0.20 - 1.20 mg/dL   Alkaline Phosphatase 63 40 - 150 U/L   AST 26 5 - 34 U/L   ALT 22 0 - 55 U/L   Total Protein 6.9 6.4 - 8.3 g/dL   Albumin 3.9 3.5 - 5.0 g/dL   Calcium 9.7 8.4 - 10.4 mg/dL   Anion Gap 10 3 - 11 mEq/L   EGFR 75 (L) >90 ml/min/1.73 m2   I have reviewed the CBC results with her at visit.  No mention of giant platelets in tech review.  Studies/Results:  CT AP 02-2015 liver and spleen WNL and stable inguinal and iliac nodes then.  Medications: I have reviewed the patient's current medications.  DISCUSSION Clinically patient is feeling the best that she has in a year or longer, and I have encouraged her to continue with regular exercise and with the improved diet. Changes in counts, particularly the decrease in platelets, is noted. I have told patient again today that I expect she will do best with some treatment for the CLL before much longer; she does seem slightly more open to that consideration now, tho she immediately tells me that she does not want bone marrow exam. I have told her again that much progress has been made in CLL treatment since she was diagnosed over 7 years ago. I have suggested that my partners who regularly treat CLL may be most appropriate to follow her again at this point, and patient is in  agreement with me discussing with them and would be agreeable to transferring care to them if appropriate.  She understands that we will let her know about next appointment pending above.   Assessment/Plan: 1.CLL with hypogammaglobulinemia: CLL initially diagnosed 2009, no unfavorable cytogenetics. Only intervention thus far has been intermittent IVIG, clinically very helpful with decrease in respiratory infections. Platelets today lower, now just at 100k, other counts as noted. If thrombocytopenia persists will need to differentiate immune related vs CLL etiology and may need further treatment. I will discuss with my hematology partners.  Last imaging was CT AP 02-2015. Negative Hep B serology 2015 (Hep  A Ab reactive?) 2. Obesity BMI 37, deconditioning, poor exercise tolerance: she is delighted with progress in just a short time with regular exercise program, which she should continue. Intentional weight loss with dietary adjustments also encouraged. 3.HTN, elevated cholesterol followed by Dr Melford Aase 4.obstructive sleep apnea for which she uses CPAP 5.GERD controlled 6.up to date mammograms [facility] 01-23-16. Unremarkable colonoscopy 09-2012 7.environmental allergies   All questions answered. Patient knows to call if concerns and understands that labs and this note will be shared with Dr Melford Aase. Time spent 25 min including >50% counseling and coordination of care.    Myndi Wamble P, MD   05/02/2016, 5:11 PM

## 2016-05-02 DIAGNOSIS — D696 Thrombocytopenia, unspecified: Secondary | ICD-10-CM | POA: Insufficient documentation

## 2016-05-04 ENCOUNTER — Telehealth: Payer: Self-pay

## 2016-05-04 NOTE — Telephone Encounter (Signed)
LM in Ms. Tsao voice mail noting the information as documented below by Dr. Marko Plume.  Pt may call Dr. Mariana Kaufman office if she has has questions or concerns.

## 2016-05-04 NOTE — Telephone Encounter (Signed)
-----   Message from Gordy Levan, MD sent at 05/04/2016  8:02 AM EDT ----- Please let Mrs Hyle know that Dr Irene Limbo is glad to see her for the CLL - he is a hematologist and will be able to take very good care of her. His scheduler will call her with appointment, to be seen in next month or so.  She can certainly call if needed before that appointment. Tell her that I will let Dr Melford Aase know that Dr Irene Limbo will involved now.  Also tell her that I hope she has a lovely trip to [place]!  thanks

## 2016-05-05 ENCOUNTER — Other Ambulatory Visit: Payer: Self-pay | Admitting: *Deleted

## 2016-05-05 ENCOUNTER — Encounter: Payer: Self-pay | Admitting: *Deleted

## 2016-05-05 DIAGNOSIS — C9110 Chronic lymphocytic leukemia of B-cell type not having achieved remission: Secondary | ICD-10-CM

## 2016-05-05 NOTE — Progress Notes (Signed)
Called patient to inform her of new appointment time. Left voicemail to return call.

## 2016-05-18 ENCOUNTER — Encounter: Payer: Self-pay | Admitting: Internal Medicine

## 2016-05-18 ENCOUNTER — Ambulatory Visit (INDEPENDENT_AMBULATORY_CARE_PROVIDER_SITE_OTHER): Payer: 59 | Admitting: Internal Medicine

## 2016-05-18 VITALS — BP 138/90 | HR 80 | Temp 97.4°F | Resp 16 | Ht 62.75 in | Wt 205.2 lb

## 2016-05-18 DIAGNOSIS — A084 Viral intestinal infection, unspecified: Secondary | ICD-10-CM

## 2016-05-18 MED ORDER — HYOSCYAMINE SULFATE 0.125 MG SL SUBL: SUBLINGUAL_TABLET | SUBLINGUAL | Status: DC

## 2016-05-18 MED ORDER — ONDANSETRON HCL 8 MG PO TABS: ORAL_TABLET | ORAL | Status: AC

## 2016-05-18 NOTE — Patient Instructions (Signed)
Imodium (Loperamide) 2 mg   take 2 tablets immediately for diarrhea,   then 1 to 2 tabs after each Diarrheal stool (maximun 12 tabs/day)   Viral Gastroenteritis Viral gastroenteritis is also known as stomach flu. This condition affects the stomach and intestinal tract. It can cause sudden diarrhea and vomiting. The illness typically lasts 3 to 8 days. Most people develop an immune response that eventually gets rid of the virus. While this natural response develops, the virus can make you quite ill. CAUSES  Many different viruses can cause gastroenteritis, such as rotavirus or noroviruses. You can catch one of these viruses by consuming contaminated food or water. You may also catch a virus by sharing utensils or other personal items with an infected person or by touching a contaminated surface. SYMPTOMS  The most common symptoms are diarrhea and vomiting. These problems can cause a severe loss of body fluids (dehydration) and a body salt (electrolyte) imbalance. Other symptoms may include:  Fever.  Headache.  Fatigue.  Abdominal pain. DIAGNOSIS  Your caregiver can usually diagnose viral gastroenteritis based on your symptoms and a physical exam. A stool sample may also be taken to test for the presence of viruses or other infections. TREATMENT  This illness typically goes away on its own. Treatments are aimed at rehydration. The most serious cases of viral gastroenteritis involve vomiting so severely that you are not able to keep fluids down. In these cases, fluids must be given through an intravenous line (IV). HOME CARE INSTRUCTIONS   Drink enough fluids to keep your urine clear or pale yellow. Drink small amounts of fluids frequently and increase the amounts as tolerated.  Ask your caregiver for specific rehydration instructions.  Avoid:  Foods high in sugar.  Alcohol.  Carbonated drinks.  Tobacco.  Juice.  Caffeine drinks.  Extremely hot or cold fluids.  Fatty,  greasy foods.  Too much intake of anything at one time.  Dairy products until 24 to 48 hours after diarrhea stops.  You may consume probiotics. Probiotics are active cultures of beneficial bacteria. They may lessen the amount and number of diarrheal stools in adults. Probiotics can be found in yogurt with active cultures and in supplements.  Wash your hands well to avoid spreading the virus.  Only take over-the-counter or prescription medicines for pain, discomfort, or fever as directed by your caregiver. Do not give aspirin to children. Antidiarrheal medicines are not recommended.  Ask your caregiver if you should continue to take your regular prescribed and over-the-counter medicines.  Keep all follow-up appointments as directed by your caregiver. SEEK IMMEDIATE MEDICAL CARE IF:   You are unable to keep fluids down.  You do not urinate at least once every 6 to 8 hours.  You develop shortness of breath.  You notice blood in your stool or vomit. This may look like coffee grounds.  You have abdominal pain that increases or is concentrated in one small area (localized).  You have persistent vomiting or diarrhea.  You have a fever.  The patient is a child younger than 3 months, and he or she has a fever.  The patient is a child older than 3 months, and he or she has a fever and persistent symptoms.  The patient is a child older than 3 months, and he or she has a fever and symptoms suddenly get worse.  The patient is a baby, and he or she has no tears when crying. MAKE SURE YOU:   Understand these instructions.  Will watch your condition.  Will get help right away if you are not doing well or get worse.   This information is not intended to replace advice given to you by your health care provider. Make sure you discuss any questions you have with your health care provider.   Document Released: 11/21/2005 Document Revised: 02/13/2012 Document Reviewed: 09/07/2011 Elsevier  Interactive Patient Education [employer].

## 2016-05-20 NOTE — Progress Notes (Signed)
Subjective:    Patient ID: Rebecca Bond, female    DOB: 04/11/1946, 70 y.o.   MRN: [id number]  HPI  Patient has hx/o CLL followed closely by Rebecca Bond and she presents with a 5-6 day hx/o intermittent Diarrhea and 3-4 day hx/o EG cramping & who awoke this am with regurgitation of clear mucoid emesis. She denies fever, chills, sweats, rash, hematemesis, melena or hematochezia.   Medication Sig  . ALPRAZolam 1 MG tablet TAKE 1/2 TO 1 TABLET BY MOUTH 3 TIMES A DAY AS NEEDED FOR ANXIETY  . VITAMIN C 1000 MG tablet Take 1,000 mg by mouth daily.  [name] Kitchen VITAMIN-B COMPLEX Take 1 tablet by mouth daily.  . bisoprolol-hctz 10-6.25  Take 1 tablet by mouth daily.  [name] Kitchen buPROPion XL 300 MG 24 hr tablet Take 1 tablet every morning for mood & depression  . VITAMIN D Take 10,000 Units by mouth daily.   [name] Kitchen ezetimibe  10 MG tablet Take 1 tablet (10 mg total) by mouth at bedtime.  . fexofenadine  180 MG tablet Take 1 tablet (180 mg total) by mouth daily. Take PRN for allergies.  [name] Kitchen FLORASTOR 250 MG capsule Take 250 mg by mouth daily.   . hctz 12.5 MG tablet TAKE 1 TABLET BY MOUTH ONCE DAILY  . OTC Nasocort daily as needed  . OTC Sustain Ultra Eye drops  . OTC Calcium 250 mg/Magnesium 100 mg 2 tabs daily  . pantoprazole  40 MG tablet Take 1 tablet (40 mg total) by mouth daily. (Patient taking differently: Take 40 mg by mouth daily as needed. )   Allergies  Allergen Reactions  . Hyzaar [Losartan Potassium-Hctz] Other (See Comments)    "messed up my sodium counts" swells up lips.  Rebecca Bond [Pseudoephedrine Hcl] Palpitations  . Biaxin [Clarithromycin]     GI Upset  . Celexa [Citalopram]   . Flexeril [Cyclobenzaprine]     "zombie-like" feeling  . Fosamax [Alendronate Sodium]     GI upset  . Iohexol Hives     Code: HIVES, Desc: pt states she broke out in hive 20 yrs ago from IV contrast.     . Losartan     Angioedema  . Meloxicam     GI upset  . Norvasc [Amlodipine] Swelling  . Pseudoephedrine    Palpitations  . Zoloft [Sertraline Hcl]    Past Medical History  Diagnosis Date  . Benign essential HTN 11/22/2011  . CLL (chronic lymphocytic leukemia) ([place])   . Pneumonia 01/2010  . History of bronchitis 08/02/2012    "maybe once/yr if that much; last time 01/2010"  . Recurrent sinus infections 08/02/2012  . 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 High cholesterol   . 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 Prediabetes   . Anxiety   . Depression   . GERD (gastroesophageal reflux disease)   . OSA (obstructive sleep apnea)    Past Surgical History  Procedure Laterality Date  . Cholecystectomy  1985  . Tonsillectomy and adenoidectomy  1950's  . Breast surgery    . Lymph node biopsy      "determined I had CLL"  . Temporomandibular joint arthroplasty  1980's  . Functional endoscopic sinus surgery  1990's    "cause I kept having sinus infections"  . Abdominal hysterectomy  1980's    "endometrosis"  . Laparoscopic appendectomy N/A 06/04/2014    Procedure: APPENDECTOMY LAPAROSCOPIC;  Surgeon: Zenovia Jarred, MD;  Location: [facility];  Service: General;  Laterality: N/A;   Review of Systems  10 point systems review negative except as above.    Objective:   Physical Exam  BP 138/90 mmHg  Pulse 80  Temp(Src) 97.4 F (36.3 C)  Resp 16  Ht 5' 2.75" (1.594 m)  Wt 205 lb 3.2 oz (93.078 kg)  BMI 36.63 kg/m2  HEENT - Eac's patent. TM's Nl. EOM's full. PERRLA. NasoOroPharynx clear. Neck - supple. Nl Thyroid. Carotids 2+ & No bruits, nodes, JVD Chest - Clear equal BS w/o Rales, rhonchi, wheezes. Cor - Nl HS. RRR w/o sig MGR. PP 1(+). No edema. Abd - Soft and No palpable organomegaly, masses or tenderness. BS nl. MS- FROM w/o deformities. Muscle power, tone and bulk Nl. Gait Nl. Neuro - No obvious Cr N abnormalities. Sensory, motor and Cerebellar functions appear Nl w/o focal abnormalities.    Assessment & Plan:   1. Viral gastroenteritis  - ondansetron  (ZOFRAN) 8 MG tablet; Take 1 tablet 3 x day for nausea  Dispense: 30 tablet; Refill: 1 - hyoscyamine (LEVSIN SL) 0.125 MG SL tablet; Take 1 to 2 tablets 3 to 4 x day if needed for Nausea, vomiting, cramping or diarrhea  Dispense: 90 tablet; Refill: 0  - discussed meds & SE's and given parameters to call if sx';s worsened.

## 2016-05-30 ENCOUNTER — Encounter: Payer: Self-pay | Admitting: Internal Medicine

## 2016-05-30 ENCOUNTER — Ambulatory Visit (INDEPENDENT_AMBULATORY_CARE_PROVIDER_SITE_OTHER): Payer: 59 | Admitting: Internal Medicine

## 2016-05-30 VITALS — BP 126/82 | HR 72 | Temp 97.6°F | Resp 16 | Ht 62.75 in | Wt 208.0 lb

## 2016-05-30 DIAGNOSIS — C9110 Chronic lymphocytic leukemia of B-cell type not having achieved remission: Secondary | ICD-10-CM

## 2016-05-30 DIAGNOSIS — E782 Mixed hyperlipidemia: Secondary | ICD-10-CM | POA: Diagnosis not present

## 2016-05-30 DIAGNOSIS — I10 Essential (primary) hypertension: Secondary | ICD-10-CM

## 2016-05-30 DIAGNOSIS — Z79899 Other long term (current) drug therapy: Secondary | ICD-10-CM

## 2016-05-30 DIAGNOSIS — M7072 Other bursitis of hip, left hip: Secondary | ICD-10-CM | POA: Diagnosis not present

## 2016-05-30 DIAGNOSIS — R7303 Prediabetes: Secondary | ICD-10-CM | POA: Diagnosis not present

## 2016-05-30 DIAGNOSIS — K219 Gastro-esophageal reflux disease without esophagitis: Secondary | ICD-10-CM | POA: Diagnosis not present

## 2016-05-30 DIAGNOSIS — E559 Vitamin D deficiency, unspecified: Secondary | ICD-10-CM | POA: Diagnosis not present

## 2016-05-30 MED ORDER — PREDNISONE 20 MG PO TABS: ORAL_TABLET | ORAL | Status: DC

## 2016-05-30 NOTE — Patient Instructions (Signed)

## 2016-05-30 NOTE — Progress Notes (Signed)
Patient ID: Rebecca Bond, female   DOB: May 22, 1946, 70 y.o.   MRN: [id number]  [facility] ADULT & ADOLESCENT INTERNAL MEDICINE                       [name], M.D.        Uvaldo Bristle. Silverio Lay, P.A.-C       Starlyn Skeans, P.A.-C   [facility]                [address] [place] [id number] Telephone [phone] Telefax [phone] ______________________________________________________________________     This very nice 70 y.o. MWF presents for 3 month follow up with Hypertension, Hyperlipidemia, Pre-Diabetes and Vitamin D Deficiency. Patient was  dx'd with CLL in 2013 followed initially by Dr Beryle Beams by active surveillance and more recently by Dr Marko Plume who added treatments with Gamma Globulin with reduced frequency of her chronic sinusitis flares and over the last year,  patient's WBC' s are trending up and Plt cts drifting down and  now patient is scheduled to see Dr Irene Limbo soon for consideration for other definitive treatments.  Also today, patient is c/o L hip pains worsened with activity .      Patient is treated for HTN since circa 1998 & BP has been controlled at home. Today's BP: is 126/82 mmHg. Patient has had no complaints of any cardiac type chest pain, palpitations, dyspnea/orthopnea/PND, dizziness, claudication, or dependent edema.     Hyperlipidemia is not controlled with diet & meds. Patient denies myalgias or other med SE's.   Last Lipids were near goal with total  Cholesterol 188; HDL 59;   sl elevated LDL 105; Triglycerides 118 on 02/04/2016.     Also, the patient has history of long-standing Morbid Obesity attributed to chronic over indulgence and with consequent PreDiabetes with A1c 6.0% in 2012 and she has had great difficulty with dietary compliance and has had no symptoms of reactive hypoglycemia, diabetic polys, paresthesias or visual blurring.  Last A1c was 5.9% on 02/04/2016.       Further, the patient  also has history of Vitamin D Deficiency of "30" in 2008 and supplements vitamin D without any suspected side-effects. Last vitamin D was  Still relatively low at 47 on 02/04/2016.    Medication Sig  . ALPRAZolam  1 MG tablet TAKE 1/2 TO 1 TABLET BY MOUTH 3 TIMES A DAY AS NEEDED FOR ANXIETY  . VITAMIN C  1000 MG tablet Take 1,000 mg by mouth daily.  [name] Kitchen VITAMIN-B COMPLEX Take 1 tablet by mouth daily.  . bisoprolol-hctz 10-6.25 MG Take 1 tablet by mouth daily.  [name] Kitchen buPROPion-XL 300 MG 24  Take 1 tablet every morning for mood & depression  . VITAMIN D Take 10,000 Units by mouth daily.   [name] Kitchen ezetimibe 10 MG  Take 1 tablet (10 mg total) by mouth at bedtime.  . fexofenadine 180 MG  Take 1 tablet (180 mg total) by mouth daily. Take PRN for allergies.  [name] Kitchen FLORASTOR 250 MG  Take 250 mg by mouth daily.   . hctz 12.5 MG  TAKE 1 TABLET BY MOUTH ONCE DAILY  . hyoscyamine SL 0.125 MG Take 1 to 2 tablets 3 to 4 x day if needed for Nausea, vomiting, cramping or diarrhea  . Nasocort  daily as needed  . Sustain Ultra Eye drops   . Calcium 250  mg/Mag 100 mg  2 tabs daily  . pantoprazole  40 MG tablet Take 1 tablet (40 mg total) by mouth daily. (Patient taking differently: Take 40 mg by mouth daily as needed. )   Allergies  Allergen Reactions  . Hyzaar [Losartan Potassium-Hctz] Other (See Comments)    "messed up my sodium counts" swells up lips.  [name] [Pseudoephedrine Hcl] Palpitations  . Biaxin [Clarithromycin]     GI Upset  . Celexa [Citalopram]   . Flexeril [Cyclobenzaprine]     "zombie-like" feeling  . Fosamax [Alendronate Sodium]     GI upset  . Iohexol Hives     Code: HIVES, Desc: pt states she broke out in hive 20 yrs ago from IV contrast.    . Losartan     Angioedema  . Meloxicam     GI upset  . Norvasc [Amlodipine] Swelling  . Pseudoephedrine     Palpitations  . Zoloft [Sertraline Hcl]    PMHx:   Past Medical History  Diagnosis Date  . Benign essential HTN 11/22/2011  . CLL (chronic  lymphocytic leukemia) ([place])   . Pneumonia 01/2010  . History of bronchitis 08/02/2012    "maybe once/yr if that much; last time 01/2010"  . Recurrent sinus infections 08/02/2012  . 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 High cholesterol   . 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 Prediabetes   . Anxiety   . Depression   . GERD (gastroesophageal reflux disease)   . OSA (obstructive sleep apnea)    Immunization History  Administered Date(s) Administered  . Influenza,inj,Quad PF,36+ Mos 09/15/2014, 08/13/2015  . Influenza-Unspecified 08/31/2013  . Pneumococcal Conjugate-13 09/23/2015  . Pneumococcal-Unspecified 12/28/2010  . Td 07/05/2006   Past Surgical History  Procedure Laterality Date  . Cholecystectomy  1985  . Tonsillectomy and adenoidectomy  1950's  . Breast surgery    . Lymph node biopsy      "determined I had CLL"  . Temporomandibular joint arthroplasty  1980's  . Functional endoscopic sinus surgery  1990's  . Abdominal hysterectomy  1980's    "endometrosis"  . Laparoscopic appendectomy  - Zenovia Jarred, MD N/A 06/04/2014   FHx:    Reviewed / unchanged  SHx:    Reviewed / unchanged  Systems Review:  Constitutional: Denies fever, chills, wt changes, headaches, insomnia, fatigue, night sweats, change in appetite. Eyes: Denies redness, blurred vision, diplopia, discharge, itchy, watery eyes.  ENT: Denies discharge, congestion, post nasal drip, epistaxis, sore throat, earache, hearing loss, dental pain, tinnitus, vertigo, sinus pain, snoring.  CV: Denies chest pain, palpitations, irregular heartbeat, syncope, dyspnea, diaphoresis, orthopnea, PND, claudication or edema. Respiratory: denies cough, dyspnea, DOE, pleurisy, hoarseness, laryngitis, wheezing.  Gastrointestinal: Denies dysphagia, odynophagia, heartburn, reflux, water brash, abdominal pain or cramps, nausea, vomiting, bloating, diarrhea, constipation, hematemesis, melena, hematochezia  or  hemorrhoids. Genitourinary: Denies dysuria, frequency, urgency, nocturia, hesitancy, discharge, hematuria or flank pain. Musculoskeletal: Denies arthralgias, myalgias, stiffness, jt. swelling, pain, limping or strain/sprain.  Skin: Denies pruritus, rash, hives, warts, acne, eczema or change in skin lesion(s). Neuro: No weakness, tremor, incoordination, spasms, paresthesia or pain. Psychiatric: Denies confusion, memory loss or sensory loss. Endo: Denies change in weight, skin or hair change.  Heme/Lymph: No excessive bleeding, bruising or enlarged lymph nodes.  Physical Exam  BP 126/82  Pulse 72  Temp(97.6 F  Resp 16  Ht 5' 2.75"  Wt 208 lb    BMI 37.13   Appears over nourished and in no  distress.  Eyes: PERRLA, EOMs, conjunctiva no swelling or erythema. Sinuses: No frontal/maxillary tenderness ENT/Mouth: EAC's clear, TM's nl w/o erythema, bulging. Nares clear w/o erythema, swelling, exudates. Oropharynx clear without erythema or exudates. Oral hygiene is good. Tongue normal, non obstructing. Hearing intact.  Neck: Supple. Thyroid nl. Car 2+/2+ without bruits, nodes or JVD. Chest: Respirations nl with BS clear & equal w/o rales, rhonchi, wheezing or stridor.  Cor: Heart sounds normal w/ regular rate and rhythm without sig. murmurs, gallops, clicks, or rubs. Peripheral pulses normal and equal  without edema.  Abdomen: Soft,  rotund  & bowel sounds normal. Non-tender w/o guarding, rebound, hernias, masses, or organomegaly (no palpable splenomegaly).  Lymphatics: Unremarkable.  Musculoskeletal: Full ROM all peripheral extremities, joint stability, 5/5 strength, and normal gait. (+) tender over Lt Hip Greater Trochanteric Bursae. Neg SLR bilateral and nl hips ROM & fig 4.  Skin: Warm, dry without exposed rashes, lesions or ecchymosis apparent.  Neuro: Cranial nerves intact, reflexes equal bilaterally. Sensory-motor testing grossly intact. Tendon reflexes grossly intact.  Pysch: Alert &  oriented x 3.  Insight and judgement nl & appropriate. No ideations.  Assessment and Plan:  1. Essential hypertension  - Continue monitor blood pressure at home. Continue diet/meds same. - TSH  2. Mixed hyperlipidemia  - Continue diet/meds, exercise,& lifestyle modifications. Continue monitor periodic cholesterol/liver & renal functions  - Lipid panel - TSH  3. Prediabetes  - Continue diet, exercise, lifestyle modifications. Monitor appropriate labs. - Hemoglobin A1c - Insulin, random  4. Vitamin D deficiency  - Continue supplementation. - VITAMIN D 25 Hydroxy   5. Gastroesophageal reflux disease, esophagitis presence not specified   6. CLL (chronic lymphocytic leukemia) ([place])   7. Medication management  - CBC with Differential/Platelet - BASIC METABOLIC PANEL WITH GFR - Hepatic function panel - Magnesium  8. Hip bursitis, left  - predniSONE (DELTASONE) 20 MG tablet; 1 tab 3 x day for 3 days, then 1 tab 2 x day for 3 days, then 1 tab 1 x day for 5 days for Hip Bursitis  Dispense: 20 tablet; Refill: 0   Recommended regular exercise, BP monitoring, weight control, and discussed med and SE's. Recommended labs to assess and monitor clinical status. Further disposition pending results of labs. Over 30 minutes of exam, counseling, chart review was performed

## 2016-05-31 LAB — HEMOGLOBIN A1C
Hgb A1c MFr Bld: 5.7 % — ABNORMAL HIGH (ref <5.7)
Mean Plasma Glucose: 117 mg/dL

## 2016-05-31 LAB — CBC WITH DIFFERENTIAL/PLATELET
BASOS PCT: 0 %
Basophils Absolute: 0 cells/uL (ref 0–200)
EOS ABS: 0 {cells}/uL — AB (ref 15–500)
EOS PCT: 0 %
HCT: 33 % — ABNORMAL LOW (ref 35.0–45.0)
HEMOGLOBIN: 11.2 g/dL — AB (ref 11.7–15.5)
LYMPHS ABS: 84824 {cells}/uL — AB (ref 850–3900)
Lymphocytes Relative: 92 %
MCH: 32.4 pg (ref 27.0–33.0)
MCHC: 33.9 g/dL (ref 32.0–36.0)
MCV: 95.4 fL (ref 80.0–100.0)
MONOS PCT: 1 %
MPV: 8.4 fL (ref 7.5–12.5)
Monocytes Absolute: 922 cells/uL (ref 200–950)
NEUTROS ABS: 6454 {cells}/uL (ref 1500–7800)
Neutrophils Relative %: 7 %
PLATELETS: 105 10*3/uL — AB (ref 140–400)
RBC: 3.46 MIL/uL — ABNORMAL LOW (ref 3.80–5.10)
RDW: 14.7 % (ref 11.0–15.0)
WBC: 92.2 10*3/uL — ABNORMAL HIGH (ref 3.8–10.8)

## 2016-05-31 LAB — HEPATIC FUNCTION PANEL
ALK PHOS: 58 U/L (ref 33–130)
ALT: 17 U/L (ref 6–29)
AST: 23 U/L (ref 10–35)
Albumin: 4.3 g/dL (ref 3.6–5.1)
BILIRUBIN DIRECT: 0.1 mg/dL (ref <=0.2)
BILIRUBIN INDIRECT: 0.4 mg/dL (ref 0.2–1.2)
BILIRUBIN TOTAL: 0.5 mg/dL (ref 0.2–1.2)
Total Protein: 6.3 g/dL (ref 6.1–8.1)

## 2016-05-31 LAB — VITAMIN D 25 HYDROXY (VIT D DEFICIENCY, FRACTURES): VIT D 25 HYDROXY: 38 ng/mL (ref 30–100)

## 2016-05-31 LAB — LIPID PANEL
CHOL/HDL RATIO: 3.1 ratio (ref <=5.0)
Cholesterol: 175 mg/dL (ref 125–200)
HDL: 57 mg/dL (ref >=46)
LDL Cholesterol: 92 mg/dL (ref <130)
TRIGLYCERIDES: 132 mg/dL (ref <150)
VLDL: 26 mg/dL (ref <30)

## 2016-05-31 LAB — BASIC METABOLIC PANEL WITH GFR
BUN: 17 mg/dL (ref 7–25)
CHLORIDE: 99 mmol/L (ref 98–110)
CO2: 20 mmol/L (ref 20–31)
Calcium: 9.1 mg/dL (ref 8.6–10.4)
Creat: 0.78 mg/dL (ref 0.50–0.99)
GFR, EST NON AFRICAN AMERICAN: 78 mL/min (ref >=60)
GFR, Est African American: >89 mL/min (ref >=60)
Glucose, Bld: 85 mg/dL (ref 65–99)
POTASSIUM: 4 mmol/L (ref 3.5–5.3)
SODIUM: 131 mmol/L — AB (ref 135–146)

## 2016-05-31 LAB — INSULIN, RANDOM: INSULIN: 9.4 u[IU]/mL (ref 2.0–19.6)

## 2016-05-31 LAB — PATHOLOGIST SMEAR REVIEW

## 2016-05-31 LAB — MAGNESIUM: MAGNESIUM: 1.7 mg/dL (ref 1.5–2.5)

## 2016-05-31 LAB — TSH: TSH: 4.3 m[IU]/L

## 2016-06-01 ENCOUNTER — Telehealth: Payer: Self-pay | Admitting: Hematology

## 2016-06-01 ENCOUNTER — Other Ambulatory Visit (HOSPITAL_BASED_OUTPATIENT_CLINIC_OR_DEPARTMENT_OTHER): Payer: 59

## 2016-06-01 ENCOUNTER — Ambulatory Visit (HOSPITAL_BASED_OUTPATIENT_CLINIC_OR_DEPARTMENT_OTHER): Payer: 59 | Admitting: Hematology

## 2016-06-01 ENCOUNTER — Encounter: Payer: Self-pay | Admitting: Hematology

## 2016-06-01 VITALS — BP 171/87 | HR 64 | Temp 97.4°F | Resp 18 | Ht 62.0 in | Wt 208.0 lb

## 2016-06-01 DIAGNOSIS — C760 Malignant neoplasm of head, face and neck: Secondary | ICD-10-CM

## 2016-06-01 DIAGNOSIS — D801 Nonfamilial hypogammaglobulinemia: Secondary | ICD-10-CM | POA: Diagnosis not present

## 2016-06-01 DIAGNOSIS — D696 Thrombocytopenia, unspecified: Secondary | ICD-10-CM | POA: Diagnosis not present

## 2016-06-01 DIAGNOSIS — C9110 Chronic lymphocytic leukemia of B-cell type not having achieved remission: Secondary | ICD-10-CM | POA: Diagnosis not present

## 2016-06-01 DIAGNOSIS — I10 Essential (primary) hypertension: Secondary | ICD-10-CM

## 2016-06-01 DIAGNOSIS — R5383 Other fatigue: Secondary | ICD-10-CM | POA: Diagnosis not present

## 2016-06-01 LAB — LACTATE DEHYDROGENASE: LDH: 212 U/L (ref 125–245)

## 2016-06-01 NOTE — Telephone Encounter (Signed)
Gave pt cal & avs °

## 2016-06-03 NOTE — Progress Notes (Signed)
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 (patient reports -planned for August 2017) 13q deletion dose pre-dispose her to recurrent SCC  Current Treatment: observation  Previous treatment: IVIG for several months last winter to reduce recurrent respiratory infections (Patient notes that this helped) Has no required definitive treatment fotr CLL at this time  INTERVAL HISTORY:  Patient is here for continued management of [name]. She was following with Dr Marko Plume and has transferred care in anticipation that she might need more involved followup and might be close to requiring treatment. She notes no fevers/chills/night sweats/unexplained weight loss since her last visit. No new LNpathy, no abdominal pain no significant night sweats. Some unchanged mild fatigue. She reports that she is scheduled in Aug 2017 to get a Mohs surgery for recurrent SCC on her nose. She has not had any new infections since her last visit on 04/25/2016 with Dr Marko Plume. Has had SI joint steroid injection with no systemic steroids. Counts today are stable.   REVIEW OF SYSTEMS:    10 Point review of systems of done and is negative except as noted above.  . Past Medical History  Diagnosis Date  . Benign essential HTN 11/22/2011  . CLL (chronic lymphocytic leukemia) ([place])   . Pneumonia 01/2010  . History of bronchitis 08/02/2012    "maybe once/yr if that much; last time 01/2010"  . Recurrent sinus infections 08/02/2012  . 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 High cholesterol   . 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 Prediabetes   . Anxiety   . Depression   . GERD (gastroesophageal reflux disease)   . OSA  (obstructive sleep apnea)     . Past Surgical History  Procedure Laterality Date  . Cholecystectomy  1985  . Tonsillectomy and adenoidectomy  1950's  . Breast surgery    . Lymph node biopsy      "determined I had CLL"  . Temporomandibular joint arthroplasty  1980's  . Functional endoscopic sinus surgery  1990's    "cause I kept having sinus infections"  . Abdominal hysterectomy  1980's    "endometrosis"  . Laparoscopic appendectomy N/A 06/04/2014    Procedure: APPENDECTOMY LAPAROSCOPIC;  Surgeon: Zenovia Jarred, MD;  Location: [facility];  Service: General;  Laterality: N/A;    . Social History  Substance Use Topics  . Smoking status: Former Smoker -- 0.75 packs/day for 4 years    Types: Cigarettes    Quit date: 12/05/1968  . Smokeless tobacco: Never Used  . Alcohol Use: 8.4 oz/week    14 Glasses of wine per week     Comment: 08/02/2012 "couple glasses wine q hs; last time 2-3 wk ago"    ALLERGIES:  is allergic to hyzaar; sudafed; biaxin; celexa; flexeril; fosamax; iohexol; losartan; meloxicam; norvasc; pseudoephedrine; and zoloft.  MEDICATIONS:  Current Outpatient Prescriptions  Medication Sig Dispense Refill  . ALPRAZolam (XANAX) 1 MG tablet TAKE 1/2 TO 1 TABLET BY MOUTH 3 TIMES A DAY AS NEEDED FOR ANXIETY 90 tablet 1  . Ascorbic Acid (VITAMIN C) 1000 MG tablet Take 1,000 mg by mouth daily.    . bisoprolol-hydrochlorothiazide (ZIAC) 10-6.25 MG tablet Take 1 tablet by mouth daily.  90 tablet 0  . buPROPion (WELLBUTRIN XL) 300 MG 24 hr tablet Take 1 tablet every morning for mood & depression 90 tablet 1  . Cholecalciferol (VITAMIN D PO) Take 10,000 Units by mouth daily.     [name] Kitchen ezetimibe (ZETIA) 10 MG tablet Take 1 tablet (10 mg total) by mouth at bedtime. 30 tablet 6  . fexofenadine (ALLEGRA) 180 MG tablet Take 1 tablet (180 mg total) by mouth daily. Take PRN for allergies. 30 tablet 6  . FLORASTOR 250 MG capsule Take 250 mg by mouth daily.   1  . hydrochlorothiazide  (HYDRODIURIL) 12.5 MG tablet TAKE 1 TABLET BY MOUTH ONCE DAILY (Patient taking differently: TAKE 1 TABLET BY MOUTH ONCE DAILY as needed) 90 tablet 1  . hyoscyamine (LEVSIN SL) 0.125 MG SL tablet Take 1 to 2 tablets 3 to 4 x day if needed for Nausea, vomiting, cramping or diarrhea 90 tablet 0  . OVER THE COUNTER MEDICATION Nasocort daily as needed    . OVER THE COUNTER MEDICATION Sustain Ultra Eye drops    . OVER THE COUNTER MEDICATION Calcium 250 mg/Magnesium 100 mg 2 tabs daily    . pantoprazole (PROTONIX) 40 MG tablet Take 1 tablet (40 mg total) by mouth daily. (Patient taking differently: Take 40 mg by mouth daily as needed. ) 30 tablet 1  . predniSONE (DELTASONE) 20 MG tablet 1 tab 3 x day for 3 days, then 1 tab 2 x day for 3 days, then 1 tab 1 x day for 5 days for Hip Bursitis 20 tablet 0   No current facility-administered medications for this visit.    PHYSICAL EXAMINATION: ECOG PERFORMANCE STATUS: 1 - Symptomatic but completely ambulatory  . Filed Vitals:   06/01/16 1559  BP: 171/87  Pulse: 64  Temp: 97.4 F (36.3 C)  Resp: 18    Filed Weights   06/01/16 1559  Weight: 208 lb (94.348 kg)   .Body mass index is 38.03 kg/(m^2).  GENERAL:alert, in no acute distress and comfortable SKIN: no acute rashes  EYES: normal, conjunctiva are pink and non-injected, sclera clear OROPHARYNX:no exudate, no erythema and lips, buccal mucosa, and tongue normal  NECK: supple, no JVD, thyroid normal size, non-tender, without nodularity LYMPH:  no palpable lymphadenopathy in the cervical, axillary or inguinal LUNGS: clear to auscultation with normal respiratory effort HEART: regular rate & rhythm,  no murmurs and no lower extremity edema ABDOMEN: abdomen soft, non-tender, normoactive bowel sounds , no palpable hepatosplenomegaly Musculoskeletal: no cyanosis of digits and no clubbing  PSYCH: alert & oriented x 3 with fluent speech NEURO: no focal motor/sensory deficits  LABORATORY DATA:    I have reviewed the data as listed  . CBC Latest Ref Rng 05/30/2016 04/25/2016 03/03/2016  WBC 3.8 - 10.8 K/uL 92.2(H) 138.5(HH) 91.7(HH)  Hemoglobin 11.7 - 15.5 g/dL 11.2(L) 11.3(L) 11.8  Hematocrit 35.0 - 45.0 % 33.0(L) 35.4 37.1  Platelets 140 - 400 K/uL 105(L) 101(L) 130(L)  Platelets appears somewhat clumped - so actual platelet count is likely higher.  . CMP Latest Ref Rng 05/30/2016 04/25/2016 03/03/2016  Glucose 65 - 99 mg/dL 85 96 99  BUN 7 - 25 mg/dL 17 23.1 20.7  Creatinine 0.50 - 0.99 mg/dL 0.78 0.8 0.9  Sodium 135 - 146 mmol/L 131(L) 132(L) 136  Potassium 3.5 - 5.3 mmol/L 4.0 4.0 3.9  Chloride 98 - 110 mmol/L 99 - -  CO2 20 - 31 mmol/L 20 23 24   Calcium 8.6 - 10.4 mg/dL 9.1 9.7 9.6  Total Protein 6.1 - 8.1 g/dL 6.3 6.9 7.2  Total Bilirubin 0.2 - 1.2 mg/dL 0.5 0.45 0.50  Alkaline Phos 33 - 130 U/L 58 63 62  AST 10 - 35 U/L 23 26 25   ALT 6 - 29 U/L 17 22 18      RADIOGRAPHIC STUDIES: I have personally reviewed the radiological images as listed and agreed with the findings in the report.  CT ABDOMEN AND PELVIS WITHOUT CONTRAST (02/06/2015)  TECHNIQUE: Multidetector CT imaging of the abdomen and pelvis was performed following the standard protocol without IV contrast.  COMPARISON: 06/04/2014  FINDINGS: The lung bases are free of acute infiltrate or sizable effusion.  The gallbladder has been surgically removed. The liver again demonstrates a rounded hypodensity within the right lobe which is stable from the prior study. This is consistent with a small cysts. The spleen, adrenal glands and pancreas are within normal limits. The kidneys are well visualized bilaterally and reveal no obstructive change. A tiny stone is noted in the lower pole of the right kidney.  The appendix has been surgically removed. The uterus has been removed as well. The bladder is well distended. No pelvic mass lesion is seen. Stable lymph nodes are noted in the inguinal regions as  well as iliac chains bilaterally. The overall appearance is similar to that seen on the prior exams dating back to 2014.  IMPRESSION: Postsurgical changes.  Stable hepatic cyst.  Tiny nonobstructing right renal stone.  Stable bilateral pelvic and inguinal lymph nodes dating back to 2014 consistent with a benign etiology.   Electronically Signed  By: Inez Catalina M.D.  On: 02/06/2015 10:54  ASSESSMENT & PLAN:   70 yo caucasian female with   1.Rai Stage 1 CLL with hypogammaglobulinemia: CLL initially diagnosed 2009 with 13q deletion.. Only intervention thus far has been intermittent IVIG, clinically very helpful with decrease in respiratory infections.  Last imaging was CT AP 02-2015. --showed no evidence of Splenomegaly. Negative Hep B serology 2015   Lymphocyte count lower in clinic today likely from recent steroid use. No overt new constitutional symptoms except mild unchanged fatigue. LDH WNL  2. Mild Anemia Hgb not <11 3. Mild thrombocytopenia PLT 105K. 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 Has not had significant recently infections at this time. Has been taking excellent infection prevention precautions. PLAN -no overt indication for treatment of the patients CLL at this time. -we discussed the pros and cons and strategies for the uses of IVIG on a preventive basis vs as needed. She notes that she would like to be off the IVIG at this time but might consider being on it during the winter months. -also IVIG could cause some immune mediated thrombocytopenia/anemia as well. -will rpt platelet in citrate on next visit -continue close f/u with dermatologist for evaluation and management of non melanoma skin cancers that can be increased in patient with CLL with 13q deletion. -would be  reasonable to pursue antibiotic prophylaxis with  Cephalexin or Clindamycin for significant skin surgeries to reduce risk of infection.  2. Recurrent SCC of Nose -patient notes that she has an appointment for Mohs procedure in aug 2017 -would recommend Keflex or clindamycin as pre-op antibiotics to reduce risk of infection -might need to consider IVIG if evidence of infection noted. ?role of prophylactic IVIG in this setting. -will need continue dermatology f/u due to increased  risk of recurrent SCC with her 13q deletion.   3. Obesity BMI 37, deconditioning, poor exercise tolerance: 4.HTN, elevated cholesterol followed by Dr Melford Aase 5.obstructive sleep apnea for which she uses CPAP 6.GERD controlled 7.up to date mammograms [facility] 01-23-16. Unremarkable colonoscopy 09-2012 8.environmental allergies  RTC with Dr Irene Limbo in 2 months ( about a week before skin surgery) with rpt labs Continue f/u with PCP  I spent 30 minutes counseling the patient face to face. The total time spent in the appointment was 30 minutes and more than 50% was on counseling and direct patient cares.    Sullivan Lone MD [place] AAHIVMS [facility] Hematology/Oncology Physician [facility]  (Office):       [phone] (Work cell):  [phone] (Fax):           [phone]

## 2016-06-08 ENCOUNTER — Ambulatory Visit: Payer: 59 | Admitting: Gastroenterology

## 2016-06-29 ENCOUNTER — Other Ambulatory Visit: Payer: Self-pay | Admitting: Internal Medicine

## 2016-06-29 ENCOUNTER — Other Ambulatory Visit: Payer: Self-pay | Admitting: Physician Assistant

## 2016-06-30 ENCOUNTER — Other Ambulatory Visit: Payer: Self-pay | Admitting: *Deleted

## 2016-06-30 MED ORDER — PANTOPRAZOLE SODIUM 40 MG PO TBEC: 40.0000 mg | DELAYED_RELEASE_TABLET | Freq: Every day | ORAL | 1 refills | Status: DC

## 2016-07-26 ENCOUNTER — Telehealth: Payer: Self-pay | Admitting: Hematology

## 2016-07-26 NOTE — Telephone Encounter (Signed)
DR Irene Limbo COVERING AP - MOVED F/U FROM 8/23 TO 8/30. LEFT MESSAGE FOR PATIENT

## 2016-07-27 ENCOUNTER — Ambulatory Visit: Payer: 59 | Admitting: Hematology

## 2016-07-27 ENCOUNTER — Other Ambulatory Visit: Payer: 59

## 2016-08-02 ENCOUNTER — Telehealth: Payer: Self-pay | Admitting: Hematology

## 2016-08-02 NOTE — Telephone Encounter (Signed)
CALLED PATIENT TO [facility] APPT FROM 08/03/16 TO 08/09/16 AS PER PROVIDER CANCELLED. L/M ON V/M WITH NEW APPT DATE AND TIME. APPT LTR & SCHD ALSO MAILED.08/02/16

## 2016-08-03 ENCOUNTER — Ambulatory Visit: Payer: 59 | Admitting: Hematology

## 2016-08-03 ENCOUNTER — Other Ambulatory Visit: Payer: 59

## 2016-08-09 ENCOUNTER — Other Ambulatory Visit (HOSPITAL_BASED_OUTPATIENT_CLINIC_OR_DEPARTMENT_OTHER): Payer: 59

## 2016-08-09 ENCOUNTER — Encounter: Payer: Self-pay | Admitting: Hematology

## 2016-08-09 ENCOUNTER — Ambulatory Visit (HOSPITAL_BASED_OUTPATIENT_CLINIC_OR_DEPARTMENT_OTHER): Payer: 59 | Admitting: Hematology

## 2016-08-09 ENCOUNTER — Telehealth: Payer: Self-pay | Admitting: Hematology

## 2016-08-09 VITALS — BP 162/83 | HR 78 | Temp 97.8°F | Resp 18 | Wt 214.6 lb

## 2016-08-09 DIAGNOSIS — D801 Nonfamilial hypogammaglobulinemia: Secondary | ICD-10-CM

## 2016-08-09 DIAGNOSIS — C9110 Chronic lymphocytic leukemia of B-cell type not having achieved remission: Secondary | ICD-10-CM

## 2016-08-09 DIAGNOSIS — J329 Chronic sinusitis, unspecified: Secondary | ICD-10-CM | POA: Diagnosis not present

## 2016-08-09 DIAGNOSIS — D696 Thrombocytopenia, unspecified: Secondary | ICD-10-CM

## 2016-08-09 DIAGNOSIS — C44321 Squamous cell carcinoma of skin of nose: Secondary | ICD-10-CM

## 2016-08-09 LAB — COMPREHENSIVE METABOLIC PANEL
ALBUMIN: 3.9 g/dL (ref 3.5–5.0)
ALK PHOS: 68 U/L (ref 40–150)
ALT: 21 U/L (ref 0–55)
AST: 24 U/L (ref 5–34)
Anion Gap: 13 mEq/L — ABNORMAL HIGH (ref 3–11)
BILIRUBIN TOTAL: 0.5 mg/dL (ref 0.20–1.20)
BUN: 12 mg/dL (ref 7.0–26.0)
CALCIUM: 9 mg/dL (ref 8.4–10.4)
CO2: 22 mEq/L (ref 22–29)
CREATININE: 0.8 mg/dL (ref 0.6–1.1)
Chloride: 102 mEq/L (ref 98–109)
EGFR: 74 mL/min/{1.73_m2} — ABNORMAL LOW (ref >90)
GLUCOSE: 114 mg/dL (ref 70–140)
Potassium: 3.9 mEq/L (ref 3.5–5.1)
Sodium: 136 mEq/L (ref 136–145)
TOTAL PROTEIN: 6.5 g/dL (ref 6.4–8.3)

## 2016-08-09 LAB — CBC & DIFF AND RETIC
BASO%: 0.2 % (ref 0.0–2.0)
Basophils Absolute: 0.2 10*3/uL — ABNORMAL HIGH (ref 0.0–0.1)
EOS%: 0.2 % (ref 0.0–7.0)
Eosinophils Absolute: 0.2 10*3/uL (ref 0.0–0.5)
HCT: 32.7 % — ABNORMAL LOW (ref 34.8–46.6)
HGB: 10.8 g/dL — ABNORMAL LOW (ref 11.6–15.9)
Immature Retic Fract: 21.7 % — ABNORMAL HIGH (ref 1.60–10.00)
LYMPH#: 79.8 10*3/uL — AB (ref 0.9–3.3)
LYMPH%: 93 % — AB (ref 14.0–49.7)
MCH: 31.6 pg (ref 25.1–34.0)
MCHC: 33 g/dL (ref 31.5–36.0)
MCV: 95.6 fL (ref 79.5–101.0)
MONO#: 1.1 10*3/uL — ABNORMAL HIGH (ref 0.1–0.9)
MONO%: 1.3 % (ref 0.0–14.0)
NEUT#: 4.6 10*3/uL (ref 1.5–6.5)
NEUT%: 5.3 % — AB (ref 38.4–76.8)
PLATELETS: 90 10*3/uL — AB (ref 145–400)
RBC: 3.42 10*6/uL — AB (ref 3.70–5.45)
RDW: 14.3 % (ref 11.2–14.5)
RETIC CT ABS: 67.72 10*3/uL (ref 33.70–90.70)
Retic %: 1.98 % (ref 0.70–2.10)
WBC: 85.8 10*3/uL (ref 3.9–10.3)

## 2016-08-09 LAB — LACTATE DEHYDROGENASE: LDH: 229 U/L (ref 125–245)

## 2016-08-09 LAB — TECHNOLOGIST REVIEW

## 2016-08-09 MED ORDER — CLINDAMYCIN HCL 300 MG PO CAPS: 600.0000 mg | ORAL_CAPSULE | Freq: Once | ORAL | 0 refills | Status: DC | PRN

## 2016-08-09 NOTE — Progress Notes (Signed)
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 (patient reports -planned for August 2017) 13q deletion dose pre-dispose her to recurrent SCC  Current Treatment: observation  Previous treatment: IVIG for several months last winter to reduce recurrent respiratory infections (Patient notes that this helped) Has no required definitive treatment for CLL at this time  INTERVAL HISTORY:  Patient is here for continued management of [name]. She notes no acute new symptoms. No fevers no chills no night sweats no overt new enlarged lymph nodes. No abdominal pain. She is anxious about her upcoming surgery or squamous cell carcinoma on the nose which will be requiring a Mohs surgery. She wonders if she should take antibiotics around the time of surgery and whether she is at high risk of infection. We discussed the pros and cons of starting IVIG to reduce potential for postoperative infection but more importantly to reduce her risk of recurrent respiratory infections over the through flu season. She decided to proceed with low-dose IVIG replacement over the fall and winter. She was given a prescription for clindamycin for preoperative prophylaxis for her upcoming nose surgery. No other acute new symptoms.  REVIEW OF SYSTEMS:    10 Point review of systems of done and is negative except as noted above.  . Past Medical History:  Diagnosis Date  . Anxiety   . Benign essential HTN 11/22/2011  . CLL (chronic lymphocytic leukemia) ([place])   . Depression   . GERD (gastroesophageal reflux disease)   . 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 High cholesterol   . History of bronchitis 08/02/2012   "maybe once/yr if that much; last time 01/2010"    . 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 OSA (obstructive sleep apnea)   . Pneumonia 01/2010  . Prediabetes   . Recurrent sinus infections 08/02/2012    . Past Surgical History:  Procedure Laterality Date  . ABDOMINAL HYSTERECTOMY  1980's   "endometrosis"  . BREAST SURGERY    . CHOLECYSTECTOMY  1985  . FUNCTIONAL ENDOSCOPIC SINUS SURGERY  1990's   "cause I kept having sinus infections"  . LAPAROSCOPIC APPENDECTOMY N/A 06/04/2014   Procedure: APPENDECTOMY LAPAROSCOPIC;  Surgeon: Zenovia Jarred, MD;  Location: [facility];  Service: General;  Laterality: N/A;  . LYMPH NODE BIOPSY     "determined I had CLL"  . TEMPOROMANDIBULAR JOINT ARTHROPLASTY  1980's  . TONSILLECTOMY AND ADENOIDECTOMY  1950's    . Social History  Substance Use Topics  . Smoking status: Former Smoker    Packs/day: 0.75    Years: 4.00    Types: Cigarettes    Quit date: 12/05/1968  . Smokeless tobacco: Never Used  . Alcohol use 8.4 oz/week    14 Glasses of wine per week     Comment: 08/02/2012 "couple glasses wine q hs; last time 2-3 wk ago"    ALLERGIES:  is allergic to hyzaar [losartan potassium-hctz]; sudafed [pseudoephedrine hcl]; biaxin [clarithromycin]; celexa [citalopram]; flexeril [cyclobenzaprine]; fosamax [alendronate sodium]; iohexol; losartan; meloxicam; norvasc [amlodipine]; pseudoephedrine; and zoloft [sertraline hcl].  MEDICATIONS:  Current Outpatient Prescriptions  Medication Sig Dispense Refill  . ALPRAZolam (XANAX) 1 MG tablet TAKE 1/2 TO 1 TABLET BY MOUTH 3 TIMES A DAY  AS NEEDED FOR ANXIETY 90 tablet 1  . Ascorbic Acid (VITAMIN C) 1000 MG tablet Take 1,000 mg by mouth daily.    . bisoprolol-hydrochlorothiazide (ZIAC) 10-6.25 MG tablet take 1 tablet by mouth once daily 90 tablet 1  . buPROPion (WELLBUTRIN XL) 150 MG 24 hr tablet take 1 tablet by mouth once daily 90 tablet 1  . Cholecalciferol (VITAMIN D PO) Take 10,000 Units by mouth daily.     . fexofenadine  (ALLEGRA) 180 MG tablet Take 1 tablet (180 mg total) by mouth daily. Take PRN for allergies. 30 tablet 6  . FLORASTOR 250 MG capsule Take 250 mg by mouth daily.   1  . hydrochlorothiazide (HYDRODIURIL) 12.5 MG tablet TAKE 1 TABLET BY MOUTH ONCE DAILY (Patient taking differently: TAKE 1 TABLET BY MOUTH ONCE DAILY as needed) 90 tablet 1  . HYDROcodone-acetaminophen (NORCO/VICODIN) 5-325 MG tablet Take 1 tablet by mouth every 6 (six) hours as needed for moderate pain. 20 tablet 0  . hyoscyamine (LEVSIN SL) 0.125 MG SL tablet Take 1 to 2 tablets 3 to 4 x day if needed for Nausea, vomiting, cramping or diarrhea 90 tablet 0  . OVER THE COUNTER MEDICATION Nasocort daily as needed    . OVER THE COUNTER MEDICATION Sustain Ultra Eye drops    . OVER THE COUNTER MEDICATION Calcium 250 mg/Magnesium 100 mg 2 tabs daily    . pantoprazole (PROTONIX) 40 MG tablet Take 1 tablet (40 mg total) by mouth daily. 30 tablet 1  . predniSONE (STERAPRED UNI-PAK 21 TAB) 10 MG (21) TBPK tablet Take 60 mg (6 tabs) PO QD x 2 days; then take 40 mg (4 tabs) QD x 2 days; then 20 mg (2 tabs) QD x 2 days; then 10 mg (1 tab) QD till gone. 26 tablet 0  . ZETIA 10 MG tablet take 1 tablet by mouth at bedtime 90 tablet 1   No current facility-administered medications for this visit.     PHYSICAL EXAMINATION: ECOG PERFORMANCE STATUS: 1 - Symptomatic but completely ambulatory  . Vitals:   08/09/16 0930  BP: (!) 162/83  Pulse: 78  Resp: 18  Temp: 97.8 F (36.6 C)    Filed Weights   08/09/16 0930  Weight: 214 lb 9.6 oz (97.3 kg)   .Body mass index is 39.25 kg/m.  GENERAL:alert, in no acute distress and comfortable SKIN: no acute rashes  EYES: normal, conjunctiva are pink and non-injected, sclera clear OROPHARYNX:no exudate, no erythema and lips, buccal mucosa, and tongue normal  NECK: supple, no JVD, thyroid normal size, non-tender, without nodularity LYMPH:  no palpable lymphadenopathy in the cervical, axillary or  inguinal LUNGS: clear to auscultation with normal respiratory effort HEART: regular rate & rhythm,  no murmurs and no lower extremity edema ABDOMEN: abdomen soft, non-tender, normoactive bowel sounds , no palpable hepatosplenomegaly Musculoskeletal: no cyanosis of digits and no clubbing  PSYCH: alert & oriented x 3 with fluent speech NEURO: no focal motor/sensory deficits  LABORATORY DATA:   I have reviewed the data as listed  . CBC Latest Ref Rng & Units 08/09/2016 05/30/2016 04/25/2016  WBC 3.9 - 10.3 10e3/uL 85.8(HH) 92.2(H) 138.5(HH)  Hemoglobin 11.6 - 15.9 g/dL 10.8(L) 11.2(L) 11.3(L)  Hematocrit 34.8 - 46.6 % 32.7(L) 33.0(L) 35.4  Platelets 145 - 400 10e3/uL 90(L) 105(L) 101(L)  Platelets appears somewhat clumped - so actual platelet count is likely higher.  . CMP Latest Ref Rng & Units 05/30/2016 04/25/2016 03/03/2016  Glucose 65 - 99 mg/dL 85 96 99  BUN 7 - 25 mg/dL 17 23.1 20.7  Creatinine 0.50 - 0.99 mg/dL 0.78 0.8 0.9  Sodium 135 - 146 mmol/L 131(L) 132(L) 136  Potassium 3.5 - 5.3 mmol/L 4.0 4.0 3.9  Chloride 98 - 110 mmol/L 99 - -  CO2 20 - 31 mmol/L 20 23 24   Calcium 8.6 - 10.4 mg/dL 9.1 9.7 9.6  Total Protein 6.1 - 8.1 g/dL 6.3 6.9 7.2  Total Bilirubin 0.2 - 1.2 mg/dL 0.5 0.45 0.50  Alkaline Phos 33 - 130 U/L 58 63 62  AST 10 - 35 U/L 23 26 25   ALT 6 - 29 U/L 17 22 18      RADIOGRAPHIC STUDIES: I have personally reviewed the radiological images as listed and agreed with the findings in the report.  CT ABDOMEN AND PELVIS WITHOUT CONTRAST (02/06/2015)  TECHNIQUE: Multidetector CT imaging of the abdomen and pelvis was performed following the standard protocol without IV contrast.  COMPARISON: 06/04/2014  FINDINGS: The lung bases are free of acute infiltrate or sizable effusion.  The gallbladder has been surgically removed. The liver again demonstrates a rounded hypodensity within the right lobe which is stable from the prior study. This is consistent with a  small cysts. The spleen, adrenal glands and pancreas are within normal limits. The kidneys are well visualized bilaterally and reveal no obstructive change. A tiny stone is noted in the lower pole of the right kidney.  The appendix has been surgically removed. The uterus has been removed as well. The bladder is well distended. No pelvic mass lesion is seen. Stable lymph nodes are noted in the inguinal regions as well as iliac chains bilaterally. The overall appearance is similar to that seen on the prior exams dating back to 2014.  IMPRESSION: Postsurgical changes.  Stable hepatic cyst.  Tiny nonobstructing right renal stone.  Stable bilateral pelvic and inguinal lymph nodes dating back to 2014 consistent with a benign etiology.   Electronically Signed  By: Inez Catalina M.D.  On: 02/06/2015 10:54  ASSESSMENT & PLAN:   70 yo caucasian female with   1.Rai Stage 1 CLL with hypogammaglobulinemia: CLL initially diagnosed 2009 with 13q deletion.. Only intervention thus far has been intermittent IVIG, clinically very helpful with decrease in respiratory infections.  Last imaging was CT AP 02-2015. --showed no evidence of Splenomegaly. Negative Hep B serology 2015   Lymphocyte count lower in clinic today likely from recent steroid use. No overt new constitutional symptoms except mild unchanged fatigue. LDH WNL  2. Mild Anemia Hgb not <11 3. Mild thrombocytopenia PLT 90K. 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 Has not had significant recently infections at this time. Has had some issues with headaches in the past. Has been taking excellent infection prevention precautions. 5. Recurrent cutaneous SCC upcoming Mohs surgery for SCC of the nose.-   her 13q deletion places her at risk for recurrent  SCC. PLAN -no overt indication for treatment of the patients CLL at this time. -Given prescription for preoperative antibody prophylaxis with clindamycin.  -we discussed the pros and cons and strategies for the uses of IVIG on a preventive basis vs as needed. She has decided to proceed with IVIG and will be set up for this. -She subsequently did get IVIG and had some IVIG related headaches despite adequate prophylaxis. Needed IV fluids and IV Toradol and short post-of steroids to help  with her IVIG related headaches with some minimal meningismus - managed by our nurse practitioner Retta Mac. -We will hold off on additional IVIG at this time and see her review is to her next scheduled infusion to rediscuss this issue. -continue close f/u with dermatologist for evaluation and management of non melanoma skin cancers that can be increased in patient with CLL with 13q deletion.  2. Recurrent SCC of Nose -patient notes that she has an appointment for Mohs procedure in aug 2017 -would recommend clindamycin as pre-op antibiotics to reduce risk of infection -might need to consider IVIG if evidence of infection noted. ?role of prophylactic IVIG in this setting. -will need continue dermatology f/u due to increased risk of recurrent SCC with her 13q deletion.   3. Obesity BMI 37, deconditioning, poor exercise tolerance: 4.HTN, elevated cholesterol followed by Dr Melford Aase 5.obstructive sleep apnea for which she uses CPAP 6.GERD controlled 7.up to date mammograms [facility] 01-23-16. Unremarkable colonoscopy 09-2012 8.environmental allergies  RTC with Dr Irene Limbo in 1 month with rpt labs. Continue f/u with PCP  I spent 30 minutes counseling the patient face to face. The total time spent in the appointment was 30 minutes and more than 50% was on counseling and direct patient cares.    Sullivan Lone MD [place] AAHIVMS [facility] Hematology/Oncology Physician [facility]  (Office):        [phone] (Work cell):  [phone] (Fax):           [phone]

## 2016-08-09 NOTE — Telephone Encounter (Signed)
Gave patient avs report and appointments for September thru November  °

## 2016-08-10 LAB — IGG: IGG (IMMUNOGLOBIN G), SERUM: 388 mg/dL — AB (ref 700–1600)

## 2016-08-12 ENCOUNTER — Other Ambulatory Visit: Payer: Self-pay | Admitting: Internal Medicine

## 2016-08-18 ENCOUNTER — Other Ambulatory Visit: Payer: Self-pay | Admitting: *Deleted

## 2016-08-18 ENCOUNTER — Telehealth: Payer: Self-pay | Admitting: Hematology

## 2016-08-18 NOTE — Telephone Encounter (Signed)
Patient called in triage wanting to know about her IVIG for tomorrow being authorized because she hadn't heard anything. I advised  Lahoma Crocker in triage that I would ask Darlena and let patient know someone would contact her back. I spoke with Darlena who called [place](Lou Lelon Frohlich) to check on the status of authorization. She was told it was still pending. Darlena told me to tell the patient if they do not receive notification from [facility] by 2pm today, the patient may receive a phone call to reschedule tomorrow's visit. I contacted patient to advise her of the status and she states the doctor did not want this re-scheduled due to her having a recent surgery. She said when it was scheduled she was told there would not be any issues with having it done on 08/19/16. Patient asked if she could contact the Rn or who did she need to contact. Patient also asked how would she know if [facility] received it. Gave patient Darlena's name and number([phone]) for further questions and if she would like to contact [facility] to follow up on the status and have them follow up with Darlena that was an option as well. Patient thanked me for my assistance.

## 2016-08-19 ENCOUNTER — Ambulatory Visit (HOSPITAL_BASED_OUTPATIENT_CLINIC_OR_DEPARTMENT_OTHER): Payer: 59

## 2016-08-19 VITALS — BP 136/47 | HR 60 | Temp 97.0°F | Resp 16

## 2016-08-19 DIAGNOSIS — C9110 Chronic lymphocytic leukemia of B-cell type not having achieved remission: Secondary | ICD-10-CM

## 2016-08-19 DIAGNOSIS — D801 Nonfamilial hypogammaglobulinemia: Secondary | ICD-10-CM

## 2016-08-19 MED ORDER — SODIUM CHLORIDE 0.9 % IV SOLN
Freq: Once | INTRAVENOUS | Status: AC
  Administered 2016-08-19: 08:00:00 via INTRAVENOUS

## 2016-08-19 MED ORDER — DIPHENHYDRAMINE HCL 25 MG PO CAPS
ORAL_CAPSULE | ORAL | Status: AC
  Filled 2016-08-19: qty 1

## 2016-08-19 MED ORDER — IMMUNE GLOBULIN (HUMAN) 10 GM/100ML IV SOLN
0.5000 g/kg | Freq: Once | INTRAVENOUS | Status: AC
  Administered 2016-08-19: 50 g via INTRAVENOUS
  Filled 2016-08-19: qty 400

## 2016-08-19 MED ORDER — ACETAMINOPHEN 325 MG PO TABS
650.0000 mg | ORAL_TABLET | Freq: Four times a day (QID) | ORAL | Status: DC | PRN
  Administered 2016-08-19: 650 mg via ORAL

## 2016-08-19 MED ORDER — ACETAMINOPHEN 325 MG PO TABS
ORAL_TABLET | ORAL | Status: AC
  Filled 2016-08-19: qty 2

## 2016-08-19 MED ORDER — DEXAMETHASONE SODIUM PHOSPHATE 100 MG/10ML IJ SOLN
Freq: Once | INTRAMUSCULAR | Status: AC
  Administered 2016-08-19: 08:00:00 via INTRAVENOUS
  Filled 2016-08-19: qty 2

## 2016-08-19 MED ORDER — DIPHENHYDRAMINE HCL 25 MG PO TABS
25.0000 mg | ORAL_TABLET | Freq: Once | ORAL | Status: AC
  Administered 2016-08-19: 25 mg via ORAL
  Filled 2016-08-19: qty 1

## 2016-08-19 NOTE — Patient Instructions (Signed)

## 2016-08-22 ENCOUNTER — Other Ambulatory Visit (HOSPITAL_BASED_OUTPATIENT_CLINIC_OR_DEPARTMENT_OTHER): Payer: 59

## 2016-08-22 ENCOUNTER — Other Ambulatory Visit: Payer: Self-pay | Admitting: Internal Medicine

## 2016-08-22 ENCOUNTER — Other Ambulatory Visit: Payer: Self-pay | Admitting: *Deleted

## 2016-08-22 ENCOUNTER — Encounter: Payer: Self-pay | Admitting: Nurse Practitioner

## 2016-08-22 ENCOUNTER — Telehealth: Payer: Self-pay

## 2016-08-22 ENCOUNTER — Ambulatory Visit (HOSPITAL_BASED_OUTPATIENT_CLINIC_OR_DEPARTMENT_OTHER): Payer: 59 | Admitting: Nurse Practitioner

## 2016-08-22 VITALS — BP 154/67 | HR 71 | Temp 98.9°F | Resp 16 | Ht 62.0 in

## 2016-08-22 DIAGNOSIS — E782 Mixed hyperlipidemia: Secondary | ICD-10-CM

## 2016-08-22 DIAGNOSIS — R7303 Prediabetes: Secondary | ICD-10-CM

## 2016-08-22 DIAGNOSIS — C9110 Chronic lymphocytic leukemia of B-cell type not having achieved remission: Secondary | ICD-10-CM | POA: Diagnosis not present

## 2016-08-22 DIAGNOSIS — E559 Vitamin D deficiency, unspecified: Secondary | ICD-10-CM

## 2016-08-22 DIAGNOSIS — E860 Dehydration: Secondary | ICD-10-CM | POA: Diagnosis not present

## 2016-08-22 DIAGNOSIS — K219 Gastro-esophageal reflux disease without esophagitis: Secondary | ICD-10-CM

## 2016-08-22 DIAGNOSIS — D801 Nonfamilial hypogammaglobulinemia: Secondary | ICD-10-CM | POA: Diagnosis not present

## 2016-08-22 DIAGNOSIS — R51 Headache: Secondary | ICD-10-CM | POA: Diagnosis not present

## 2016-08-22 DIAGNOSIS — I10 Essential (primary) hypertension: Secondary | ICD-10-CM

## 2016-08-22 DIAGNOSIS — Z79899 Other long term (current) drug therapy: Secondary | ICD-10-CM

## 2016-08-22 DIAGNOSIS — R519 Headache, unspecified: Secondary | ICD-10-CM | POA: Insufficient documentation

## 2016-08-22 LAB — CBC WITH DIFFERENTIAL/PLATELET
BASO%: 0.3 % (ref 0.0–2.0)
BASOS ABS: 0.5 10*3/uL — AB (ref 0.0–0.1)
EOS ABS: 0.5 10*3/uL (ref 0.0–0.5)
EOS%: 0.3 % (ref 0.0–7.0)
HCT: 34.5 % — ABNORMAL LOW (ref 34.8–46.6)
HEMOGLOBIN: 11.4 g/dL — AB (ref 11.6–15.9)
LYMPH%: 90.4 % — ABNORMAL HIGH (ref 14.0–49.7)
MCH: 31.7 pg (ref 25.1–34.0)
MCHC: 33.1 g/dL (ref 31.5–36.0)
MCV: 95.8 fL (ref 79.5–101.0)
MONO#: 2.3 10*3/uL — AB (ref 0.1–0.9)
MONO%: 1.6 % (ref 0.0–14.0)
NEUT%: 7.4 % — ABNORMAL LOW (ref 38.4–76.8)
NEUTROS ABS: 10.6 10*3/uL — AB (ref 1.5–6.5)
PLATELETS: 149 10*3/uL (ref 145–400)
RBC: 3.61 10*6/uL — ABNORMAL LOW (ref 3.70–5.45)
RDW: 15.1 % — AB (ref 11.2–14.5)
WBC: 144 10*3/uL — AB (ref 3.9–10.3)
lymph#: 130.2 10*3/uL — ABNORMAL HIGH (ref 0.9–3.3)

## 2016-08-22 LAB — COMPREHENSIVE METABOLIC PANEL
ALBUMIN: 4 g/dL (ref 3.5–5.0)
ALK PHOS: 73 U/L (ref 40–150)
ALT: 16 U/L (ref 0–55)
ANION GAP: 12 meq/L — AB (ref 3–11)
AST: 23 U/L (ref 5–34)
BILIRUBIN TOTAL: 0.69 mg/dL (ref 0.20–1.20)
BUN: 16.8 mg/dL (ref 7.0–26.0)
CALCIUM: 9.7 mg/dL (ref 8.4–10.4)
CO2: 24 mEq/L (ref 22–29)
Chloride: 98 mEq/L (ref 98–109)
Creatinine: 1 mg/dL (ref 0.6–1.1)
EGFR: 59 mL/min/{1.73_m2} — AB (ref >90)
Glucose: 109 mg/dl (ref 70–140)
POTASSIUM: 4.2 meq/L (ref 3.5–5.1)
SODIUM: 134 meq/L — AB (ref 136–145)
TOTAL PROTEIN: 8 g/dL (ref 6.4–8.3)

## 2016-08-22 LAB — TECHNOLOGIST REVIEW

## 2016-08-22 MED ORDER — SODIUM CHLORIDE 0.9 % IV SOLN
1000.0000 mL | Freq: Once | INTRAVENOUS | Status: AC
  Administered 2016-08-22: 1000 mL via INTRAVENOUS

## 2016-08-22 MED ORDER — METHYLPREDNISOLONE SODIUM SUCC 125 MG IJ SOLR
60.0000 mg | Freq: Once | INTRAMUSCULAR | Status: AC
  Administered 2016-08-22: 60 mg via INTRAVENOUS

## 2016-08-22 MED ORDER — PREDNISONE 10 MG (21) PO TBPK: ORAL_TABLET | ORAL | 0 refills | Status: DC

## 2016-08-22 MED ORDER — DIPHENHYDRAMINE HCL 50 MG/ML IJ SOLN
25.0000 mg | Freq: Once | INTRAMUSCULAR | Status: AC
  Administered 2016-08-22: 25 mg via INTRAVENOUS

## 2016-08-22 MED ORDER — DIPHENHYDRAMINE HCL 50 MG/ML IJ SOLN
INTRAMUSCULAR | Status: AC
  Filled 2016-08-22: qty 1

## 2016-08-22 MED ORDER — METHYLPREDNISOLONE SODIUM SUCC 125 MG IJ SOLR
INTRAMUSCULAR | Status: AC
  Filled 2016-08-22: qty 2

## 2016-08-22 MED ORDER — KETOROLAC TROMETHAMINE 30 MG/ML IJ SOLN
INTRAMUSCULAR | Status: AC
  Filled 2016-08-22: qty 1

## 2016-08-22 MED ORDER — KETOROLAC TROMETHAMINE 30 MG/ML IJ SOLN
30.0000 mg | Freq: Once | INTRAMUSCULAR | Status: AC
  Administered 2016-08-22: 30 mg via INTRAVENOUS

## 2016-08-22 MED ORDER — HYDROCODONE-ACETAMINOPHEN 5-325 MG PO TABS: 1.0000 | ORAL_TABLET | Freq: Four times a day (QID) | ORAL | 0 refills | Status: DC | PRN

## 2016-08-22 NOTE — Patient Instructions (Signed)

## 2016-08-22 NOTE — Assessment & Plan Note (Signed)
Patient is currently undergoing observation only for her chronic lymphocytic leukemia.  Of note-white count has increased to 144 with ANC 10.6.  Reviewed all findings and lab results with Dr. Irene Limbo; and he advised that this is most likely secondary to recent IVIG infusion and steroids in the past as well.  Patient is scheduled to follow-up with her primary care physician tomorrow 08/23/2016.  She is scheduled to return for her next IVIG infusion on 09/16/2016.

## 2016-08-22 NOTE — Assessment & Plan Note (Addendum)
Patient received IVIG infusion this past Friday, 08/19/2016.  She states that she typically has a mild headache that resolves in less than 1 day with all of her previous IVIG infusions.  Patient presented to the [facility] today with complaint of headache that initiated directly after her IVIG infusion; and continues.  She states that her headache originates in the back of her head; and radiates upward.  She states that her head hurts worse when she moves her eyes back and forth.  She also complains of posterior neck discomfort as well.  She denies any specific light or sound sensitivity.  She states she always feels mild nausea following each of her IVIG treatment; but has had no vomiting.  Exam today reveals well.  Patient; but she does appear uncomfortable and anxious.  Patient does have some mild posterior C-spine tenderness with neck range of motion.  Pupils are equal, round, and reactive to light.  Vital signs are stable and patient is afebrile.  Reviewed all findings with Dr. 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  Patient was also mildly dehydrated today with a sodium of 134.  Patient will receive IV fluid rehydration today; as well as Toradol 30 mg IV, Benadryl 25 mg IV, and Solu-Medrol 60 mg.  Monitoring.  Patient very closely.  At this present time.  If patient's headache is much improved following her infusion-will consider a prednisone taper at home over the next week.  Also, patient was advised to call/return or go directly to the emergency department for any worsening symptoms whatsoever. _______________________________  Update: Prior to discharge the [facility] today-patient stated that her headache has greatly improved and is now rated only 4 on the pain scale.  She also was able to move her neck in all directions with no discomfort whatsoever.  She was no longer nauseous.

## 2016-08-22 NOTE — Assessment & Plan Note (Signed)
Patient appears mildly dehydrated and sodium was 134 today.  Patient will receive IV fluid rehydration while at the cancer Center today.  She was also encouraged push fluids is much as possible.

## 2016-08-22 NOTE — Telephone Encounter (Signed)
Pt called with c/o headache. She had privigen immune globulin on Friday. She developed HA on Sunday. Today it is pounding and excruciating. She gets like she wants to pass out when she gets up. She has tried tylenol and benadryl. She has drank H2O and gatorade and gingerale. She is a tad nausea, she feels warm but not hot. S/w Dr Irene Limbo and scheduled her for lab and [facility] today. She will get her husband to drive her and will be here about 1045. In basket sent to scheduler

## 2016-08-22 NOTE — Progress Notes (Signed)
SYMPTOM MANAGEMENT CLINIC    Chief Complaint: Headache  HPI:  Rebecca Bond 70 y.o. female diagnosed with chronic lymphocytic leukemia and hypogammaglobulinemia.  Presents to the [facility] today with complaint of headache.    No history exists.    Review of Systems  Constitutional: Positive for fever and malaise/fatigue.  Gastrointestinal: Positive for nausea.  Neurological: Positive for headaches.  All other systems reviewed and are negative.   Past Medical History:  Diagnosis Date  . Anxiety   . Benign essential HTN 11/22/2011  . CLL (chronic lymphocytic leukemia) ([place])   . Depression   . GERD (gastroesophageal reflux disease)   . 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 High cholesterol   . History of bronchitis 08/02/2012   "maybe once/yr if that much; last time 01/2010"  . 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 OSA (obstructive sleep apnea)   . Pneumonia 01/2010  . Prediabetes   . Recurrent sinus infections 08/02/2012    Past Surgical History:  Procedure Laterality Date  . ABDOMINAL HYSTERECTOMY  1980's   "endometrosis"  . BREAST SURGERY    . CHOLECYSTECTOMY  1985  . FUNCTIONAL ENDOSCOPIC SINUS SURGERY  1990's   "cause I kept having sinus infections"  . LAPAROSCOPIC APPENDECTOMY N/A 06/04/2014   Procedure: APPENDECTOMY LAPAROSCOPIC;  Surgeon: Zenovia Jarred, MD;  Location: [facility];  Service: General;  Laterality: N/A;  . LYMPH NODE BIOPSY     "determined I had CLL"  . TEMPOROMANDIBULAR JOINT ARTHROPLASTY  1980's  . TONSILLECTOMY AND ADENOIDECTOMY  1950's    has CLL (chronic lymphocytic leukemia) ([place]); Hepatitis A; Morbid obesity (BMI 37.95); Angioedema secondary to ACE/ARB; Prediabetes; Depression, controlled; GERD (gastroesophageal reflux disease); Recurrent sinus infections; OSA (obstructive sleep apnea); HTN (hypertension); Vitamin D deficiency; Hypogammaglobulinemia ([place]); Mixed hyperlipidemia; Medication management;  Environmental and seasonal allergies; BMI 37.85,   adult; Immunocompromised ([place]); Thrombocytopenia ([place]); Dehydration; and Headache on her problem list.    is allergic to hyzaar [losartan potassium-hctz]; sudafed [pseudoephedrine hcl]; biaxin [clarithromycin]; celexa [citalopram]; flexeril [cyclobenzaprine]; fosamax [alendronate sodium]; iohexol; losartan; meloxicam; norvasc [amlodipine]; pseudoephedrine; and zoloft [sertraline hcl].    Medication List       Accurate as of 08/22/16 11:59 PM. Always use your most recent med list.          ALPRAZolam 1 MG tablet Commonly known as:  XANAX TAKE 1/2 TO 1 TABLET BY MOUTH 3 TIMES A DAY AS NEEDED FOR ANXIETY   bisoprolol-hydrochlorothiazide 10-6.25 MG tablet Commonly known as:  ZIAC take 1 tablet by mouth once daily   buPROPion 150 MG 24 hr tablet Commonly known as:  WELLBUTRIN XL take 1 tablet by mouth once daily   clindamycin 300 MG capsule Commonly known as:  CLEOCIN Take 2 capsules (600 mg total) by mouth once as needed. Take 60 mins prior to your planned Moh's surgery.   fexofenadine 180 MG tablet Commonly known as:  ALLEGRA Take 1 tablet (180 mg total) by mouth daily. Take PRN for allergies.   FLORASTOR 250 MG capsule Generic drug:  saccharomyces boulardii Take 250 mg by mouth daily.   hydrochlorothiazide 12.5 MG tablet Commonly known as:  HYDRODIURIL TAKE 1 TABLET BY MOUTH ONCE DAILY   HYDROcodone-acetaminophen 5-325 MG tablet Commonly known as:  NORCO/VICODIN Take 1 tablet by mouth every 6 (six) hours as needed for moderate pain.   hyoscyamine 0.125 MG SL tablet Commonly known as:  LEVSIN SL Take 1 to 2 tablets 3 to 4 x day if  needed for Nausea, vomiting, cramping or diarrhea   OVER THE COUNTER MEDICATION Nasocort daily as needed   OVER THE COUNTER MEDICATION Sustain Ultra Eye drops   OVER THE COUNTER MEDICATION Calcium 250 mg/Magnesium 100 mg 2 tabs daily   pantoprazole 40 MG tablet Commonly known as:   PROTONIX Take 1 tablet (40 mg total) by mouth daily.   predniSONE 10 MG (21) Tbpk tablet Commonly known as:  STERAPRED UNI-PAK 21 TAB Take 60 mg (6 tabs) PO QD x 2 days; then take 40 mg (4 tabs) QD x 2 days; then 20 mg (2 tabs) QD x 2 days; then 10 mg (1 tab) QD till gone.   vitamin C 1000 MG tablet Take 1,000 mg by mouth daily.   VITAMIN D PO Take 10,000 Units by mouth daily.   ZETIA 10 MG tablet Generic drug:  ezetimibe take 1 tablet by mouth at bedtime        PHYSICAL EXAMINATION  Oncology Vitals 08/22/2016 08/22/2016  Height - 158 cm  Weight - (No Data)  Weight (lbs) - (No Data)  BMI (kg/m2) - -  Temp 98.9 98.5  Pulse 71 73  Resp 16 17  Resp (Historical as of 07/05/12) - -  SpO2 - 98  BSA (m2) - -   BP Readings from Last 2 Encounters:  08/22/16 (!) 154/67  08/19/16 (!) 136/47    Physical Exam  Constitutional: She is oriented to person, place, and time and well-developed, well-nourished, and in no distress.  HENT:  Head: Normocephalic.  Mouth/Throat: Oropharynx is clear and moist.  Eyes: Conjunctivae and EOM are normal. Pupils are equal, round, and reactive to light. Right eye exhibits no discharge. Left eye exhibits no discharge. No scleral icterus.  Neck: Normal range of motion. Neck supple. No JVD present. No tracheal deviation present. No thyromegaly present.  Pulmonary/Chest: Effort normal. No stridor. No respiratory distress.  Musculoskeletal: Normal range of motion. She exhibits no edema or tenderness.  Lymphadenopathy:    She has no cervical adenopathy.  Neurological: She is alert and oriented to person, place, and time. Gait normal.  Skin: Skin is warm and dry. No rash noted. No erythema. No pallor.  Psychiatric:  Patient is anxious.  Nursing note and vitals reviewed.   LABORATORY DATA:. Appointment on 08/22/2016  Component Date Value Ref Range Status  . WBC 08/22/2016 144.0* 3.9 - 10.3 10e3/uL Final  . NEUT# 08/22/2016 10.6* 1.5 - 6.5 10e3/uL  Final  . HGB 08/22/2016 11.4* 11.6 - 15.9 g/dL Final  . HCT 08/22/2016 34.5* 34.8 - 46.6 % Final  . Platelets 08/22/2016 149  145 - 400 10e3/uL Final  . MCV 08/22/2016 95.8  79.5 - 101.0 fL Final  . MCH 08/22/2016 31.7  25.1 - 34.0 pg Final  . MCHC 08/22/2016 33.1  31.5 - 36.0 g/dL Final  . RBC 08/22/2016 3.61* 3.70 - 5.45 10e6/uL Final  . RDW 08/22/2016 15.1* 11.2 - 14.5 % Final  . lymph# 08/22/2016 130.2* 0.9 - 3.3 10e3/uL Final  . MONO# 08/22/2016 2.3* 0.1 - 0.9 10e3/uL Final  . Eosinophils Absolute 08/22/2016 0.5  0.0 - 0.5 10e3/uL Final  . Basophils Absolute 08/22/2016 0.5* 0.0 - 0.1 10e3/uL Final  . NEUT% 08/22/2016 7.4* 38.4 - 76.8 % Final  . LYMPH% 08/22/2016 90.4* 14.0 - 49.7 % Final  . MONO% 08/22/2016 1.6  0.0 - 14.0 % Final  . EOS% 08/22/2016 0.3  0.0 - 7.0 % Final  . BASO% 08/22/2016 0.3  0.0 - 2.0 %  Final  . Sodium 08/22/2016 134* 136 - 145 mEq/L Final  . Potassium 08/22/2016 4.2  3.5 - 5.1 mEq/L Final  . Chloride 08/22/2016 98  98 - 109 mEq/L Final  . CO2 08/22/2016 24  22 - 29 mEq/L Final  . Glucose 08/22/2016 109  70 - 140 mg/dl Final  . BUN 08/22/2016 16.8  7.0 - 26.0 mg/dL Final  . Creatinine 08/22/2016 1.0  0.6 - 1.1 mg/dL Final  . Total Bilirubin 08/22/2016 0.69  0.20 - 1.20 mg/dL Final  . Alkaline Phosphatase 08/22/2016 73  40 - 150 U/L Final  . AST 08/22/2016 23  5 - 34 U/L Final  . ALT 08/22/2016 16  0 - 55 U/L Final  . Total Protein 08/22/2016 8.0  6.4 - 8.3 g/dL Final  . Albumin 08/22/2016 4.0  3.5 - 5.0 g/dL Final  . Calcium 08/22/2016 9.7  8.4 - 10.4 mg/dL Final  . Anion Gap 08/22/2016 12* 3 - 11 mEq/L Final  . EGFR 08/22/2016 59* >90 ml/min/1.73 m2 Final  . Technologist Review 08/22/2016 Variant lymphs and smudge cells present   Final    RADIOGRAPHIC STUDIES: No results found.  ASSESSMENT/PLAN:    Hypogammaglobulinemia ([place]) Patient received IVIG infusion.  This past Friday, 08/19/2016.  She states that she typically has a mild headache that  resolves in less than 1 day with all of her previous IVIG infusions.  Patient presented to the [facility] today with continued headache and neck pain.  See further notes for details.  Patient is scheduled to return for her next IVIG infusion on 09/16/2016.  Headache Patient received IVIG infusion this past Friday, 08/19/2016.  She states that she typically has a mild headache that resolves in less than 1 day with all of her previous IVIG infusions.  Patient presented to the [facility] today with complaint of headache that initiated directly after her IVIG infusion; and continues.  She states that her headache originates in the back of her head; and radiates upward.  She states that her head hurts worse when she moves her eyes back and forth.  She also complains of posterior neck discomfort as well.  She denies any specific light or sound sensitivity.  She states she always feels mild nausea following each of her IVIG treatment; but has had no vomiting.  Exam today reveals well.  Patient; but she does appear uncomfortable and anxious.  Patient does have some mild posterior C-spine tenderness with neck range of motion.  Pupils are equal, round, and reactive to light.  Vital signs are stable and patient is afebrile.  Reviewed all findings with Dr. 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  Patient was also mildly dehydrated today with a sodium of 134.  Patient will receive IV fluid rehydration today; as well as Toradol 30 mg IV, Benadryl 25 mg IV, and Solu-Medrol 60 mg.  Monitoring.  Patient very closely.  At this present time.  If patient's headache is much improved following her infusion-will consider a prednisone taper at home over the next week.  Also, patient was advised to call/return or go directly to the emergency department for any worsening symptoms whatsoever. _______________________________  Update: Prior  to discharge the [facility] today-patient stated that her headache has greatly improved and is now rated only 4 on the pain scale.  She also was able to move her neck in all directions with no discomfort whatsoever.  She was no longer nauseous.   Dehydration Patient appears mildly dehydrated and sodium was 134 today.  Patient will receive IV fluid rehydration while at the cancer Center today.  She was also encouraged push fluids is much as possible.  CLL (chronic lymphocytic leukemia) ([place]) Patient is currently undergoing observation only for her chronic lymphocytic leukemia.  Of note-white count has increased to 144 with ANC 10.6.  Reviewed all findings and lab results with Dr. Irene Limbo; and he advised that this is most likely secondary to recent IVIG infusion and steroids in the past as well.  Patient is scheduled to follow-up with her primary care physician tomorrow 08/23/2016.  She is scheduled to return for her next IVIG infusion on 09/16/2016.   Patient stated understanding of all instructions; and was in agreement with this plan of care. The patient knows to call the clinic with any problems, questions or concerns.   Total time spent with patient was 25 minutes;  with greater than 75 percent of that time spent in face to face counseling regarding patient's symptoms,  and coordination of care and follow up.  Disclaimer:This dictation was prepared with Dragon/digital dictation along with [employer]. Any transcriptional errors that result from this process are unintentional.  Drue Second, NP 08/23/2016

## 2016-08-22 NOTE — Assessment & Plan Note (Signed)
Patient received IVIG infusion.  This past Friday, 08/19/2016.  She states that she typically has a mild headache that resolves in less than 1 day with all of her previous IVIG infusions.  Patient presented to the [facility] today with continued headache and neck pain.  See further notes for details.  Patient is scheduled to return for her next IVIG infusion on 09/16/2016.

## 2016-08-23 ENCOUNTER — Ambulatory Visit (INDEPENDENT_AMBULATORY_CARE_PROVIDER_SITE_OTHER): Payer: 59 | Admitting: Internal Medicine

## 2016-08-23 ENCOUNTER — Telehealth: Payer: Self-pay | Admitting: *Deleted

## 2016-08-23 ENCOUNTER — Encounter: Payer: Self-pay | Admitting: Internal Medicine

## 2016-08-23 VITALS — BP 142/90 | HR 72 | Temp 97.7°F | Resp 16 | Ht 62.5 in | Wt 211.0 lb

## 2016-08-23 DIAGNOSIS — Z1212 Encounter for screening for malignant neoplasm of rectum: Secondary | ICD-10-CM

## 2016-08-23 DIAGNOSIS — I10 Essential (primary) hypertension: Secondary | ICD-10-CM

## 2016-08-23 DIAGNOSIS — Z136 Encounter for screening for cardiovascular disorders: Secondary | ICD-10-CM

## 2016-08-23 DIAGNOSIS — Z0000 Encounter for general adult medical examination without abnormal findings: Secondary | ICD-10-CM | POA: Diagnosis not present

## 2016-08-23 DIAGNOSIS — Z0001 Encounter for general adult medical examination with abnormal findings: Secondary | ICD-10-CM

## 2016-08-23 DIAGNOSIS — E559 Vitamin D deficiency, unspecified: Secondary | ICD-10-CM

## 2016-08-23 DIAGNOSIS — R7303 Prediabetes: Secondary | ICD-10-CM

## 2016-08-23 DIAGNOSIS — K219 Gastro-esophageal reflux disease without esophagitis: Secondary | ICD-10-CM

## 2016-08-23 DIAGNOSIS — C9110 Chronic lymphocytic leukemia of B-cell type not having achieved remission: Secondary | ICD-10-CM

## 2016-08-23 DIAGNOSIS — Z79899 Other long term (current) drug therapy: Secondary | ICD-10-CM

## 2016-08-23 DIAGNOSIS — E782 Mixed hyperlipidemia: Secondary | ICD-10-CM

## 2016-08-23 NOTE — Telephone Encounter (Signed)
TC to patient for follow up to visit in [facility] yesterday for severe headache. No answer. Left voice mail message for pt to call back to let us know how she is doing today.

## 2016-08-23 NOTE — Patient Instructions (Signed)

## 2016-08-23 NOTE — Progress Notes (Signed)
[facility] [name], M.D.    Uvaldo Bristle. Silverio Lay, P.A.-C      Starlyn Skeans, P.A.-C  [facility]                [address] [place] [id number] Telephone [phone] Telefax [phone]  Annual Screening/Preventative Visit & Comprehensive Evaluation &  Examination     This very nice 70 y.o. MWF presents for a Screening/Preventative Visit & comprehensive evaluation and management of multiple medical co-morbidities.  Patient has been followed for HTN, Prediabetes, Hyperlipidemia and Vitamin D Deficiency. Patient was dx'd with CLL in 2013  and currently is followed by Dr Irene Limbo.       HTN predates since circa 1998. Patient's BP has been controlled at home and patient denies any cardiac symptoms as chest pain, palpitations, shortness of breath, dizziness or ankle swelling. Today's BP is 142/90.       Patient's hyperlipidemia is controlled with diet and medications. Patient denies myalgias or other medication SE's. Last lipids were at goal: Lab Results  Component Value Date   CHOL 175 05/30/2016   HDL 57 05/30/2016   LDLCALC 92 05/30/2016   TRIG 132 05/30/2016   CHOLHDL 3.1 05/30/2016      Patient has [place] and Morbid Obesity (BMI 38) and consequent prediabetes with A1c 6.0% in 2012  and patient denies reactive hypoglycemic symptoms, visual blurring, diabetic polys, or paresthesias. Last A1c was near goal:  Lab Results  Component Value Date   HGBA1C 5.7 (H) 05/30/2016      Finally, patient has history of Vitamin D Deficiency in 2008 of "30" and last Vitamin D was still very low:  Lab Results  Component Value Date   VD25OH 72 05/30/2016   Current Outpatient Prescriptions on File Prior to Visit  Medication Sig  . ALPRAZolam (XANAX) 1 MG tablet TAKE 1/2-1 TAB3 TIMES A DAY AS NEEDED  . VITAMIN C 1000 MG  Take 1,000 mg by mouth daily.  [name] Kitchen buPROPion- XL 150 MG  take 1  tablet by mouth once daily  . VITAMIN D  Take 10,000 Units by mouth daily.   . fexofenadine  180 MG tablet Take 1 tab daily. Take PRN for allergies.  [name] Kitchen FLORASTOR 250 MG cap Take 250 mg by mouth daily.   . hctz 12.5 MG tablet TAKE 1 TABLET BY MOUTH ONCE DAILY   . NORCO 5-325 MG Take 1 tablet by mouth every 6 (six) hrs as needed f  . Nasocort  daily as needed  . Sustain Ultra Eye drops   . Calcium 250 mg/Magnesium 100 mg  2 tabs daily  . ZETIA 10 MG tablet take 1 tablet by mouth at bedtime   Allergies  Allergen Reactions  . Hyzaar [Losartan Potassium-Hctz] Other (See Comments)    "messed up my sodium counts" swells up lips.  [name] [Pseudoephedrine Hcl] Palpitations  . Biaxin [Clarithromycin]     GI Upset  . Celexa [Citalopram]   . Flexeril [Cyclobenzaprine]     "zombie-like" feeling  . Fosamax [Alendronate Sodium]     GI upset  . Iohexol Hives     Code: HIVES, Desc: pt states she broke out in hive 20 yrs ago from IV contrast.     . Losartan     Angioedema  . Meloxicam     GI upset  . Norvasc [Amlodipine] Swelling  .  Pseudoephedrine     Palpitations  . Zoloft [Sertraline Hcl]    Past Medical History:  Diagnosis Date  . Anxiety   . Benign essential HTN 11/22/2011  . CLL (chronic lymphocytic leukemia) ([place])   . Depression   . GERD (gastroesophageal reflux disease)   . 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 High cholesterol   . History of bronchitis 08/02/2012   "maybe once/yr if that much; last time 01/2010"  . 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 OSA (obstructive sleep apnea)   . Pneumonia 01/2010  . Prediabetes   . Recurrent sinus infections 08/02/2012   Health Maintenance  Topic Date Due  . ZOSTAVAX  10/15/2006  . DEXA SCAN  10/16/2011  . INFLUENZA VACCINE  07/05/2016  . TETANUS/TDAP  07/05/2016  . PNA vac Low Risk Adult (2 of 2 - PPSV23) 09/22/2016  . MAMMOGRAM  01/22/2018  . COLONOSCOPY  09/27/2022  . Hepatitis C Screening   Completed   Immunization History  Administered Date(s) Administered  . Influenza,inj,Quad PF,36+ Mos 09/15/2014, 08/13/2015  . Influenza-Unspecified 08/31/2013  . Pneumococcal Conjugate-13 09/23/2015  . Pneumococcal-Unspecified 12/28/2010  . Td 07/05/2006   Past Surgical History:  Procedure Laterality Date  . ABDOMINAL HYSTERECTOMY  1980's   "endometrosis"  . BREAST SURGERY    . CHOLECYSTECTOMY  1985  . FUNCTIONAL ENDOSCOPIC SINUS SURGERY  1990's   "cause I kept having sinus infections"  . LAPAROSCOPIC APPENDECTOMY N/A 06/04/2014   Procedure: APPENDECTOMY LAPAROSCOPIC;  Surgeon: Zenovia Jarred, MD;  Location: [facility];  Service: General;  Laterality: N/A;  . LYMPH NODE BIOPSY     "determined I had CLL"  . TEMPOROMANDIBULAR JOINT ARTHROPLASTY  1980's  . TONSILLECTOMY AND ADENOIDECTOMY  1950's   Family History  Problem Relation Age of Onset  . Parkinson's disease Mother   . Hypertension Mother   . Hypertension Maternal Grandmother   . Heart attack Maternal Grandmother     Mild   Social History  Substance Use Topics  . Smoking status: Former Smoker    Packs/day: 0.75    Years: 4.00    Types: Cigarettes    Quit date: 12/05/1968  . Smokeless tobacco: Never Used  . Alcohol use 8.4 oz/week    14 Glasses of wine per week     Comment: 08/02/2012 "couple glasses wine q hs; last time 2-3 wk ago"    ROS Constitutional: Denies fever, chills, weight loss/gain, headaches, insomnia,  night sweats, and change in appetite. Does c/o fatigue. Eyes: Denies redness, blurred vision, diplopia, discharge, itchy, watery eyes.  ENT: Denies discharge, congestion, post nasal drip, epistaxis, sore throat, earache, hearing loss, dental pain, Tinnitus, Vertigo, Sinus pain, snoring.  Cardio: Denies chest pain, palpitations, irregular heartbeat, syncope, dyspnea, diaphoresis, orthopnea, PND, claudication, edema Respiratory: denies cough, dyspnea, DOE, pleurisy, hoarseness, laryngitis, wheezing.   Gastrointestinal: Denies dysphagia, heartburn, reflux, water brash, pain, cramps, nausea, vomiting, bloating, diarrhea, constipation, hematemesis, melena, hematochezia, jaundice, hemorrhoids Genitourinary: Denies dysuria, frequency, urgency, nocturia, hesitancy, discharge, hematuria, flank pain Breast: Breast lumps, nipple discharge, bleeding.  Musculoskeletal: Denies arthralgia, myalgia, stiffness, Jt. Swelling, pain, limp, and strain/sprain. Denies falls. Skin: Denies puritis, rash, hives, warts, acne, eczema, changing in skin lesion Neuro: No weakness, tremor, incoordination, spasms, paresthesia, pain Psychiatric: Denies confusion, memory loss, sensory loss. Denies Depression. Endocrine: Denies change in weight, skin, hair change, nocturia, and paresthesia, diabetic polys, visual blurring, hyper / hypo glycemic episodes.  Heme/Lymph: No excessive bleeding, bruising, enlarged lymph nodes.  Physical Exam  BP (!) 142/90   Pulse 72   Temp 97.7 F (36.5 C)   Resp 16   Ht 5' 2.5" (1.588 m)   Wt 211 lb (95.7 kg)   BMI 37.98 kg/m   General Appearance: Over nourished with central obesity and in no apparent distress.  Eyes: PERRLA, EOMs, conjunctiva no swelling or erythema, normal fundi and vessels. Sinuses: No frontal/maxillary tenderness ENT/Mouth: EACs patent / TMs  nl. Nares clear without erythema, swelling, mucoid exudates. Oral hygiene is good. No erythema, swelling, or exudate. Tongue normal, non-obstructing. Tonsils not swollen or erythematous. Hearing normal.  Neck: Supple, thyroid normal. No bruits, nodes or JVD. Respiratory: Respiratory effort normal.  BS equal and clear bilateral without rales, rhonci, wheezing or stridor. Cardio: Heart sounds are normal with regular rate and rhythm and no murmurs, rubs or gallops. Peripheral pulses are normal and equal bilaterally without edema. No aortic or femoral bruits. Chest: symmetric with normal excursions and percussion. Breasts:  Deferred to Gyn.  Abdomen: Flat, soft with bowel sounds active. Nontender, no guarding, rebound, hernias, masses, or organomegaly.  Lymphatics: Non tender without lymphadenopathy.  Genitourinary:  Musculoskeletal: Full ROM all peripheral extremities, joint stability, 5/5 strength, and normal gait. Skin: Warm and dry without rashes, lesions, cyanosis, clubbing or  ecchymosis.  Neuro: Cranial nerves intact, reflexes equal bilaterally. Normal muscle tone, no cerebellar symptoms. Sensation intact.  Pysch: Alert and oriented X 3, normal affect, Insight and Judgment appropriate.   Assessment and Plan  1. Annual Preventative Screening Examination  2. Essential hypertension  - EKG 12-Lead  3. Mixed hyperlipidemia  - EKG 12-Lead  4. Prediabetes  - EKG 12-Lead  5. Vitamin D deficiency   6. Gastroesophageal reflux disease    7. CLL (chronic lymphocytic leukemia) ([place])   8. Screening for rectal cancer  - POC Hemoccult Bld/Stl   9. Screening for ischemic heart disease  - EKG 12-Lead  10. Medication management       Continue prudent diet as discussed, weight control, BP monitoring, regular exercise, and medications. Discussed med's effects and SE's.  All screening  labs were done yesterday at the [facility] and reviewed with the patient today. Over 40 minutes of exam, counseling, chart review and high complex critical decision making was performed.

## 2016-08-23 NOTE — Telephone Encounter (Signed)
Received call back from patient. She states she woke up early this am with headache and dizzyness. She took her prednisone at that time(early this am) and now she states she feels much better. She rates headache @ 0.5 on a scale of 0-10.  She states she is very grateful this she is feeling better.  Advised to call back if headache etc returns. She verbalized understanding.

## 2016-08-29 ENCOUNTER — Other Ambulatory Visit: Payer: Self-pay | Admitting: *Deleted

## 2016-08-29 DIAGNOSIS — C9110 Chronic lymphocytic leukemia of B-cell type not having achieved remission: Secondary | ICD-10-CM

## 2016-08-29 NOTE — Progress Notes (Unsigned)
Called patient per Dr. Irene Limbo to follow up on status since recent treatment.  Per Dr. Irene Limbo, lab orders and staff message left to schedule patient prior to next treatment on 10/13.

## 2016-08-31 ENCOUNTER — Telehealth: Payer: Self-pay | Admitting: Hematology

## 2016-08-31 NOTE — Telephone Encounter (Signed)
lvm to inform pt of 10/5 appt at 330 pm per LOS

## 2016-09-01 ENCOUNTER — Telehealth: Payer: Self-pay

## 2016-09-01 NOTE — Telephone Encounter (Signed)
Pt called to find out when to take her flu shot. She had immune globulin on 9/15, next due on 10/13.

## 2016-09-06 NOTE — Telephone Encounter (Signed)
It is okay for patient to receive flu shot per MD Irene Limbo. Patient called and left voicemail to return call to [facility].

## 2016-09-08 ENCOUNTER — Other Ambulatory Visit (HOSPITAL_BASED_OUTPATIENT_CLINIC_OR_DEPARTMENT_OTHER): Payer: 59

## 2016-09-08 ENCOUNTER — Ambulatory Visit (HOSPITAL_BASED_OUTPATIENT_CLINIC_OR_DEPARTMENT_OTHER): Payer: 59 | Admitting: Hematology

## 2016-09-08 VITALS — BP 147/52 | HR 66 | Temp 98.2°F | Resp 18 | Wt 207.2 lb

## 2016-09-08 DIAGNOSIS — C9110 Chronic lymphocytic leukemia of B-cell type not having achieved remission: Secondary | ICD-10-CM

## 2016-09-08 DIAGNOSIS — D801 Nonfamilial hypogammaglobulinemia: Secondary | ICD-10-CM | POA: Diagnosis not present

## 2016-09-08 LAB — CBC WITH DIFFERENTIAL/PLATELET
BASO%: 0.3 % (ref 0.0–2.0)
BASOS ABS: 0.3 10*3/uL — AB (ref 0.0–0.1)
EOS%: 0.5 % (ref 0.0–7.0)
Eosinophils Absolute: 0.6 10*3/uL — ABNORMAL HIGH (ref 0.0–0.5)
HEMATOCRIT: 36.2 % (ref 34.8–46.6)
HGB: 11.4 g/dL — ABNORMAL LOW (ref 11.6–15.9)
LYMPH#: 103.9 10*3/uL — AB (ref 0.9–3.3)
LYMPH%: 90.4 % — AB (ref 14.0–49.7)
MCH: 30.4 pg (ref 25.1–34.0)
MCHC: 31.4 g/dL — AB (ref 31.5–36.0)
MCV: 96.8 fL (ref 79.5–101.0)
MONO#: 2.8 10*3/uL — AB (ref 0.1–0.9)
MONO%: 2.4 % (ref 0.0–14.0)
NEUT#: 7.4 10*3/uL — ABNORMAL HIGH (ref 1.5–6.5)
NEUT%: 6.4 % — AB (ref 38.4–76.8)
PLATELETS: 108 10*3/uL — AB (ref 145–400)
RBC: 3.74 10*6/uL (ref 3.70–5.45)
RDW: 14.7 % — ABNORMAL HIGH (ref 11.2–14.5)
WBC: 114.9 10*3/uL (ref 3.9–10.3)

## 2016-09-08 LAB — COMPREHENSIVE METABOLIC PANEL
ALK PHOS: 67 U/L (ref 40–150)
ALT: 26 U/L (ref 0–55)
ANION GAP: 10 meq/L (ref 3–11)
AST: 32 U/L (ref 5–34)
Albumin: 3.8 g/dL (ref 3.5–5.0)
BUN: 17.1 mg/dL (ref 7.0–26.0)
CALCIUM: 9.6 mg/dL (ref 8.4–10.4)
CHLORIDE: 102 meq/L (ref 98–109)
CO2: 23 mEq/L (ref 22–29)
CREATININE: 0.9 mg/dL (ref 0.6–1.1)
EGFR: 68 mL/min/{1.73_m2} — AB (ref >90)
Glucose: 112 mg/dl (ref 70–140)
POTASSIUM: 3.5 meq/L (ref 3.5–5.1)
Sodium: 135 mEq/L — ABNORMAL LOW (ref 136–145)
Total Bilirubin: 0.5 mg/dL (ref 0.20–1.20)
Total Protein: 7 g/dL (ref 6.4–8.3)

## 2016-09-08 LAB — LACTATE DEHYDROGENASE: LDH: 209 U/L (ref 125–245)

## 2016-09-08 LAB — TECHNOLOGIST REVIEW

## 2016-09-15 ENCOUNTER — Other Ambulatory Visit: Payer: 59

## 2016-09-15 ENCOUNTER — Ambulatory Visit: Payer: 59 | Admitting: Hematology

## 2016-09-16 ENCOUNTER — Ambulatory Visit: Payer: 59

## 2016-09-30 ENCOUNTER — Ambulatory Visit (HOSPITAL_BASED_OUTPATIENT_CLINIC_OR_DEPARTMENT_OTHER): Payer: 59

## 2016-09-30 VITALS — BP 163/75 | HR 69 | Temp 97.8°F | Resp 16

## 2016-09-30 DIAGNOSIS — C9110 Chronic lymphocytic leukemia of B-cell type not having achieved remission: Secondary | ICD-10-CM

## 2016-09-30 DIAGNOSIS — D801 Nonfamilial hypogammaglobulinemia: Secondary | ICD-10-CM | POA: Diagnosis not present

## 2016-09-30 MED ORDER — ACETAMINOPHEN 325 MG PO TABS: 650.0000 mg | ORAL_TABLET | Freq: Four times a day (QID) | ORAL | Status: DC | PRN

## 2016-09-30 MED ORDER — IMMUNE GLOBULIN (HUMAN) 20 GM/200ML IV SOLN
40.0000 g | Freq: Once | INTRAVENOUS | Status: AC
  Administered 2016-09-30: 40 g via INTRAVENOUS
  Filled 2016-09-30: qty 400

## 2016-09-30 MED ORDER — ACETAMINOPHEN 325 MG PO TABS
ORAL_TABLET | ORAL | Status: AC
  Filled 2016-09-30: qty 2

## 2016-09-30 MED ORDER — METHYLPREDNISOLONE SODIUM SUCC 125 MG IJ SOLR
INTRAMUSCULAR | Status: AC
  Filled 2016-09-30: qty 2

## 2016-09-30 MED ORDER — DIPHENHYDRAMINE HCL 25 MG PO TABS
25.0000 mg | ORAL_TABLET | Freq: Once | ORAL | Status: AC
  Administered 2016-09-30: 25 mg via ORAL
  Filled 2016-09-30: qty 1

## 2016-09-30 MED ORDER — SODIUM CHLORIDE 0.9 % IV SOLN
Freq: Once | INTRAVENOUS | Status: AC
  Administered 2016-09-30: 08:00:00 via INTRAVENOUS

## 2016-09-30 MED ORDER — ACETAMINOPHEN 325 MG PO TABS
650.0000 mg | ORAL_TABLET | Freq: Once | ORAL | Status: AC
  Administered 2016-09-30: 650 mg via ORAL

## 2016-09-30 MED ORDER — DIPHENHYDRAMINE HCL 25 MG PO CAPS
ORAL_CAPSULE | ORAL | Status: AC
  Filled 2016-09-30: qty 2

## 2016-09-30 MED ORDER — METHYLPREDNISOLONE SODIUM SUCC 125 MG IJ SOLR
80.0000 mg | Freq: Once | INTRAMUSCULAR | Status: AC
  Administered 2016-09-30: 80 mg via INTRAVENOUS

## 2016-09-30 NOTE — Patient Instructions (Signed)

## 2016-10-01 ENCOUNTER — Encounter: Payer: Self-pay | Admitting: *Deleted

## 2016-10-04 ENCOUNTER — Ambulatory Visit (HOSPITAL_BASED_OUTPATIENT_CLINIC_OR_DEPARTMENT_OTHER): Payer: 59

## 2016-10-04 ENCOUNTER — Other Ambulatory Visit: Payer: Self-pay | Admitting: *Deleted

## 2016-10-04 ENCOUNTER — Ambulatory Visit (HOSPITAL_BASED_OUTPATIENT_CLINIC_OR_DEPARTMENT_OTHER): Payer: 59 | Admitting: Nurse Practitioner

## 2016-10-04 ENCOUNTER — Telehealth: Payer: Self-pay | Admitting: *Deleted

## 2016-10-04 ENCOUNTER — Other Ambulatory Visit: Payer: Self-pay

## 2016-10-04 VITALS — BP 149/73 | HR 66 | Temp 98.5°F | Resp 18 | Ht 62.5 in | Wt 208.4 lb

## 2016-10-04 DIAGNOSIS — C9110 Chronic lymphocytic leukemia of B-cell type not having achieved remission: Secondary | ICD-10-CM

## 2016-10-04 LAB — COMPREHENSIVE METABOLIC PANEL
ALK PHOS: 63 U/L (ref 40–150)
ALT: 21 U/L (ref 0–55)
ANION GAP: 10 meq/L (ref 3–11)
AST: 29 U/L (ref 5–34)
Albumin: 3.6 g/dL (ref 3.5–5.0)
BILIRUBIN TOTAL: 0.64 mg/dL (ref 0.20–1.20)
BUN: 15.6 mg/dL (ref 7.0–26.0)
CALCIUM: 9.1 mg/dL (ref 8.4–10.4)
CO2: 24 meq/L (ref 22–29)
CREATININE: 0.8 mg/dL (ref 0.6–1.1)
Chloride: 104 mEq/L (ref 98–109)
EGFR: 76 mL/min/{1.73_m2} — AB (ref >90)
Glucose: 104 mg/dl (ref 70–140)
Potassium: 4 mEq/L (ref 3.5–5.1)
Sodium: 138 mEq/L (ref 136–145)
TOTAL PROTEIN: 7.1 g/dL (ref 6.4–8.3)

## 2016-10-04 LAB — CBC WITH DIFFERENTIAL/PLATELET
BASO%: 0.1 % (ref 0.0–2.0)
BASOS ABS: 0.1 10*3/uL (ref 0.0–0.1)
EOS ABS: 0.2 10*3/uL (ref 0.0–0.5)
EOS%: 0.2 % (ref 0.0–7.0)
HEMATOCRIT: 34.2 % — AB (ref 34.8–46.6)
HEMOGLOBIN: 10.6 g/dL — AB (ref 11.6–15.9)
LYMPH#: 94.6 10*3/uL — AB (ref 0.9–3.3)
LYMPH%: 91.7 % — ABNORMAL HIGH (ref 14.0–49.7)
MCH: 30.2 pg (ref 25.1–34.0)
MCHC: 31.1 g/dL — ABNORMAL LOW (ref 31.5–36.0)
MCV: 97.1 fL (ref 79.5–101.0)
MONO#: 2.3 10*3/uL — ABNORMAL HIGH (ref 0.1–0.9)
MONO%: 2.2 % (ref 0.0–14.0)
NEUT%: 5.8 % — ABNORMAL LOW (ref 38.4–76.8)
NEUTROS ABS: 5.9 10*3/uL (ref 1.5–6.5)
Platelets: 108 10*3/uL — ABNORMAL LOW (ref 145–400)
RBC: 3.52 10*6/uL — ABNORMAL LOW (ref 3.70–5.45)
RDW: 15.1 % — AB (ref 11.2–14.5)
WBC: 103.2 10*3/uL — AB (ref 3.9–10.3)

## 2016-10-04 LAB — TECHNOLOGIST REVIEW

## 2016-10-04 MED ORDER — METHYLPREDNISOLONE SODIUM SUCC 125 MG IJ SOLR
60.0000 mg | Freq: Once | INTRAMUSCULAR | Status: DC
  Administered 2016-10-04: 60 mg via INTRAVENOUS

## 2016-10-04 MED ORDER — KETOROLAC TROMETHAMINE 30 MG/ML IJ SOLN
30.0000 mg | Freq: Once | INTRAMUSCULAR | Status: AC
  Administered 2016-10-04: 30 mg via INTRAVENOUS

## 2016-10-04 MED ORDER — PREDNISONE 20 MG PO TABS: 40.0000 mg | ORAL_TABLET | Freq: Every day | ORAL | 0 refills | Status: DC

## 2016-10-04 MED ORDER — ONDANSETRON HCL 4 MG/2ML IJ SOLN
INTRAMUSCULAR | Status: AC
  Filled 2016-10-04: qty 4

## 2016-10-04 MED ORDER — KETOROLAC TROMETHAMINE 30 MG/ML IJ SOLN
INTRAMUSCULAR | Status: AC
  Filled 2016-10-04: qty 1

## 2016-10-04 MED ORDER — SODIUM CHLORIDE 0.9 % IV SOLN: Freq: Once | INTRAVENOUS | Status: DC

## 2016-10-04 MED ORDER — METHYLPREDNISOLONE SODIUM SUCC 125 MG IJ SOLR
INTRAMUSCULAR | Status: AC
Start: 2016-10-04 — End: 2016-10-04
  Filled 2016-10-04: qty 2

## 2016-10-04 MED ORDER — SODIUM CHLORIDE 0.9 % IV SOLN
Freq: Once | INTRAVENOUS | Status: DC
  Administered 2016-10-04: 11:00:00 via INTRAVENOUS

## 2016-10-04 MED ORDER — ONDANSETRON HCL 4 MG/2ML IJ SOLN
8.0000 mg | Freq: Once | INTRAMUSCULAR | Status: DC
  Administered 2016-10-04: 8 mg via INTRAVENOUS

## 2016-10-04 NOTE — Addendum Note (Signed)
Addended by: Carolynne Edouard B on: 10/04/2016 09:14 AM   Modules accepted: Orders

## 2016-10-04 NOTE — Telephone Encounter (Signed)
Pt reports nausea, chills and headache. States it is the same as she had last time after IVIG.  Dr Irene Limbo would like patient to come for labs and IVF. Will be here today for IVF

## 2016-10-04 NOTE — Telephone Encounter (Signed)
S/w pt that Dr Irene Limbo ordered prednisone 40 mg/day for 5 days. Rx sent. Ordered received via Writer.

## 2016-10-05 ENCOUNTER — Other Ambulatory Visit: Payer: Self-pay | Admitting: *Deleted

## 2016-10-07 ENCOUNTER — Telehealth: Payer: Self-pay | Admitting: Hematology

## 2016-10-07 NOTE — Telephone Encounter (Signed)
lvm to inform pt of 11/16 appt per LOS °

## 2016-10-10 ENCOUNTER — Telehealth: Payer: Self-pay | Admitting: Hematology

## 2016-10-10 NOTE — Telephone Encounter (Signed)
Returned call to patient in regards to rescheduling the time of her 11/16 appointment for later in the day.

## 2016-10-11 ENCOUNTER — Other Ambulatory Visit: Payer: 59

## 2016-10-11 ENCOUNTER — Ambulatory Visit: Payer: 59 | Admitting: Hematology

## 2016-10-13 ENCOUNTER — Telehealth: Payer: Self-pay | Admitting: Hematology

## 2016-10-13 NOTE — Telephone Encounter (Signed)
Returned call to patient to confirm next scheduled appointment. LVM

## 2016-10-14 ENCOUNTER — Ambulatory Visit: Payer: 59 | Admitting: Hematology

## 2016-10-14 ENCOUNTER — Ambulatory Visit: Payer: 59

## 2016-10-14 ENCOUNTER — Other Ambulatory Visit: Payer: 59

## 2016-10-20 ENCOUNTER — Ambulatory Visit: Payer: 59 | Admitting: Hematology

## 2016-10-20 ENCOUNTER — Encounter: Payer: Self-pay | Admitting: Hematology

## 2016-10-20 ENCOUNTER — Telehealth: Payer: Self-pay

## 2016-10-20 ENCOUNTER — Encounter: Payer: Self-pay | Admitting: Internal Medicine

## 2016-10-20 ENCOUNTER — Ambulatory Visit (HOSPITAL_BASED_OUTPATIENT_CLINIC_OR_DEPARTMENT_OTHER): Payer: 59 | Admitting: Hematology

## 2016-10-20 ENCOUNTER — Other Ambulatory Visit: Payer: 59

## 2016-10-20 ENCOUNTER — Telehealth: Payer: Self-pay | Admitting: Hematology

## 2016-10-20 ENCOUNTER — Other Ambulatory Visit (HOSPITAL_BASED_OUTPATIENT_CLINIC_OR_DEPARTMENT_OTHER): Payer: 59

## 2016-10-20 VITALS — BP 171/72 | HR 79 | Temp 98.1°F | Resp 18 | Wt 213.7 lb

## 2016-10-20 DIAGNOSIS — C9110 Chronic lymphocytic leukemia of B-cell type not having achieved remission: Secondary | ICD-10-CM

## 2016-10-20 DIAGNOSIS — D801 Nonfamilial hypogammaglobulinemia: Secondary | ICD-10-CM | POA: Diagnosis not present

## 2016-10-20 LAB — CBC & DIFF AND RETIC
BASO%: 0.3 % (ref 0.0–2.0)
BASOS ABS: 0.3 10*3/uL — AB (ref 0.0–0.1)
EOS%: 0.2 % (ref 0.0–7.0)
Eosinophils Absolute: 0.2 10*3/uL (ref 0.0–0.5)
HEMATOCRIT: 33.7 % — AB (ref 34.8–46.6)
HGB: 10.7 g/dL — ABNORMAL LOW (ref 11.6–15.9)
IMMATURE RETIC FRACT: 18.8 % — AB (ref 1.60–10.00)
LYMPH#: 94.8 10*3/uL — AB (ref 0.9–3.3)
LYMPH%: 92.8 % — ABNORMAL HIGH (ref 14.0–49.7)
MCH: 30.5 pg (ref 25.1–34.0)
MCHC: 31.8 g/dL (ref 31.5–36.0)
MCV: 96 fL (ref 79.5–101.0)
MONO#: 1.3 10*3/uL — AB (ref 0.1–0.9)
MONO%: 1.3 % (ref 0.0–14.0)
NEUT#: 5.6 10*3/uL (ref 1.5–6.5)
NEUT%: 5.4 % — AB (ref 38.4–76.8)
PLATELETS: 84 10*3/uL — AB (ref 145–400)
RBC: 3.51 10*6/uL — ABNORMAL LOW (ref 3.70–5.45)
RDW: 14.7 % — ABNORMAL HIGH (ref 11.2–14.5)
RETIC CT ABS: 65.99 10*3/uL (ref 33.70–90.70)
Retic %: 1.88 % (ref 0.70–2.10)
WBC: 102.2 10*3/uL (ref 3.9–10.3)

## 2016-10-20 LAB — COMPREHENSIVE METABOLIC PANEL
ALBUMIN: 3.6 g/dL (ref 3.5–5.0)
ALK PHOS: 68 U/L (ref 40–150)
ALT: 25 U/L (ref 0–55)
ANION GAP: 10 meq/L (ref 3–11)
AST: 27 U/L (ref 5–34)
BUN: 15.4 mg/dL (ref 7.0–26.0)
CALCIUM: 9.5 mg/dL (ref 8.4–10.4)
CO2: 24 mEq/L (ref 22–29)
CREATININE: 0.8 mg/dL (ref 0.6–1.1)
Chloride: 103 mEq/L (ref 98–109)
EGFR: 76 mL/min/{1.73_m2} — ABNORMAL LOW (ref >90)
Glucose: 108 mg/dl (ref 70–140)
Potassium: 3.7 mEq/L (ref 3.5–5.1)
Sodium: 136 mEq/L (ref 136–145)
TOTAL PROTEIN: 6.8 g/dL (ref 6.4–8.3)
Total Bilirubin: 0.49 mg/dL (ref 0.20–1.20)

## 2016-10-20 LAB — LACTATE DEHYDROGENASE: LDH: 226 U/L (ref 125–245)

## 2016-10-20 NOTE — Telephone Encounter (Signed)
Mailed FMLA paperwork to patient per pt request.

## 2016-10-20 NOTE — Telephone Encounter (Signed)
Gave patient avs report and appointments for December and January  °

## 2016-11-08 ENCOUNTER — Encounter: Payer: Self-pay | Admitting: Physician Assistant

## 2016-11-08 ENCOUNTER — Ambulatory Visit: Payer: 59 | Admitting: Hematology

## 2016-11-08 ENCOUNTER — Other Ambulatory Visit: Payer: 59

## 2016-11-08 ENCOUNTER — Ambulatory Visit (INDEPENDENT_AMBULATORY_CARE_PROVIDER_SITE_OTHER): Payer: 59 | Admitting: Physician Assistant

## 2016-11-08 VITALS — BP 138/88 | HR 74 | Ht 62.5 in | Wt 211.0 lb

## 2016-11-08 DIAGNOSIS — R197 Diarrhea, unspecified: Secondary | ICD-10-CM | POA: Diagnosis not present

## 2016-11-08 DIAGNOSIS — R1011 Right upper quadrant pain: Secondary | ICD-10-CM

## 2016-11-08 DIAGNOSIS — R1010 Upper abdominal pain, unspecified: Secondary | ICD-10-CM

## 2016-11-08 DIAGNOSIS — Z856 Personal history of leukemia: Secondary | ICD-10-CM

## 2016-11-08 MED ORDER — DICYCLOMINE HCL 10 MG PO CAPS: ORAL_CAPSULE | ORAL | 1 refills | Status: DC

## 2016-11-08 MED ORDER — PANTOPRAZOLE SODIUM 40 MG PO TBEC: DELAYED_RELEASE_TABLET | ORAL | 3 refills | Status: DC

## 2016-11-08 NOTE — Progress Notes (Addendum)
Subjective:    Patient ID: Rebecca Bond, female    DOB: 1946-09-27, 70 y.o.   MRN: [id number]  HPI Rebecca Bond is a very nice 70 year old white female who was last seen here in 2016 by myself and at that time had been established with Rebecca Bond. Prior to that she had undergone screening colonoscopy with Rebecca Bond in 2013 which was a normal exam. She was treated for gastritis in 2016. Patient is status post cholecystectomy, she has history of obesity, obstructive sleep apnea, hypertension, and CLL. She is also being treated for a hypogammaglobulinemia and is followed by Rebecca Bond. She comes in today stating that she has been having abdominal pain over the past 4-5 weeks. She has had ongoing burning and bloating-type discomfort across her upper abdomen eating into her right upper quadrant right mid abdomen and around into her back on the right. This is been particularly bothersome at night and has been keeping her awake with a crampy upper abdominal type pain. She says she also is uncomfortable sleeping on her right side and feels better on her left side. Along with this she has had bouts of diarrhea alternating with bouts of constipation. She says she had a particularly bad day after Thanksgiving and was awakened at 4 AM with diarrhea. She said she had multiple episodes and required several Imodium to get it stopped. Is not had any fever or chills, no nausea or vomiting. She has not been using any aspirin or NSAIDs. She had received IVIG in mid November but says her GI symptoms had started prior to that. He had a bad reaction to the IVIG ,and was told that she should not have any further infusions. She has also had a drop in her platelet count, has a follow-up with Rebecca Bond to decide on management in another week or so.  Labs done 10/20/2016 liver function studies within normal limits, creatinine 0.8, WBC 102.2, hemoglobin 10.7 hematocrit 33.7, platelets 84,000 No recent imaging.  Review of Systems  Pertinent positive and negative review of systems were noted in the above HPI section.  All other review of systems was otherwise negative.  Outpatient Encounter Prescriptions as of 11/08/2016  Medication Sig  . ALPRAZolam (XANAX) 1 MG tablet TAKE 1/2 TO 1 TABLET BY MOUTH 3 TIMES A DAY AS NEEDED FOR ANXIETY  . Ascorbic Acid (VITAMIN C) 1000 MG tablet Take 1,000 mg by mouth daily.  . bisoprolol-hydrochlorothiazide (ZIAC) 10-6.25 MG tablet take 1 tablet by mouth once daily  . buPROPion (WELLBUTRIN XL) 150 MG 24 hr tablet take 1 tablet by mouth once daily  . Cholecalciferol (VITAMIN D PO) Take 10,000 Units by mouth daily.   . fexofenadine (ALLEGRA) 180 MG tablet Take 1 tablet (180 mg total) by mouth daily. Take PRN for allergies.  [name] Kitchen FLORASTOR 250 MG capsule Take 250 mg by mouth daily.   [name] Kitchen OVER THE COUNTER MEDICATION Nasocort daily as needed  . OVER THE COUNTER MEDICATION Sustain Ultra Eye drops  . pantoprazole (PROTONIX) 40 MG tablet Take 1 tab in the morning daily.  [name] Kitchen ZETIA 10 MG tablet take 1 tablet by mouth at bedtime  . [DISCONTINUED] OVER THE COUNTER MEDICATION Calcium 250 mg/Magnesium 100 mg 2 tabs daily  . [DISCONTINUED] pantoprazole (PROTONIX) 40 MG tablet Take 1 tablet (40 mg total) by mouth daily. (Patient taking differently: Take 40 mg by mouth daily. Takes as needed)  . [DISCONTINUED] predniSONE (DELTASONE) 20 MG tablet Take 2 tablets (40 mg total) by mouth daily  with breakfast.  . dicyclomine (BENTYL) 10 MG capsule Take 1 tab 3 times daily as needed for abdominal pain and cramping.  . hydrochlorothiazide (HYDRODIURIL) 12.5 MG tablet TAKE 1 TABLET BY MOUTH ONCE DAILY (Patient not taking: Reported on 11/08/2016)   No facility-administered encounter medications on file as of 11/08/2016.    Allergies  Allergen Reactions  . Hyzaar [Losartan Potassium-Hctz] Other (See Comments)    "messed up my sodium counts" swells up lips.  Rebecca Bond [Pseudoephedrine Hcl] Palpitations  . Biaxin  [Clarithromycin]     GI Upset  . Celexa [Citalopram]   . Flexeril [Cyclobenzaprine]     "zombie-like" feeling  . Fosamax [Alendronate Sodium]     GI upset  . Iohexol Hives     Code: HIVES, Desc: pt states she broke out in hive 20 yrs ago from IV contrast.     . Losartan     Angioedema  . Meloxicam     GI upset  . Norvasc [Amlodipine] Swelling  . Pseudoephedrine     Palpitations  . Zoloft [Sertraline Hcl]    Patient Active Problem List   Diagnosis Date Noted  . Dehydration 08/22/2016  . Headache 08/22/2016  . Thrombocytopenia ([place]) 05/02/2016  . Immunocompromised ([place]) 10/10/2015  . BMI 37.85,   adult 09/23/2015  . Environmental and seasonal allergies 08/15/2015  . Mixed hyperlipidemia 06/01/2015  . Medication management 06/01/2015  . Hypogammaglobulinemia ([place]) 12/21/2014  . Vitamin D deficiency 09/30/2014  . HTN (hypertension) 01/08/2014  . Prediabetes   . Depression, controlled   . GERD (gastroesophageal reflux disease)   . OSA (obstructive sleep apnea)   . Angioedema secondary to ACE/ARB 12/25/2013  . Morbid obesity (BMI 37.95) 12/17/2013  . Hepatitis A 08/02/2012  . Recurrent sinus infections 08/02/2012  . CLL (chronic lymphocytic leukemia) ([place]) 11/22/2011   Social History   Social History  . Marital status: Married    Spouse name: N/A  . Number of children: 1  . Years of education: N/A   Occupational History  . CUST SERVICE [employer]   Social History Main Topics  . Smoking status: Former Smoker    Packs/day: 0.75    Years: 4.00    Types: Cigarettes    Quit date: 12/05/1968  . Smokeless tobacco: Never Used  . Alcohol use 8.4 oz/week    14 Glasses of wine per week     Comment: 08/02/2012 "couple glasses wine q hs; last time 2-3 wk ago"  . Drug use: No  . Sexual activity: Not Currently   Other Topics Concern  . Not on file   Social History Narrative  . No narrative on file    Rebecca Bond's family history includes Heart attack in her  maternal grandmother; Hypertension in her maternal grandmother and mother; Parkinson's disease in her mother.      Objective:    Vitals:   11/08/16 1405  BP: 138/88  Pulse: 74    Physical Exam well-developed older white female in no acute distress, pleasant blood pressure 138/88 pulse 74, Height 5 foot 2, weight 211, BMI 37.9. HEENT; nontraumatic normocephalic EOMI PERRLA sclera anicteric, Cardiovascular ;regular rate and rhythm with S1-S2 no murmur or gallop, Pulmonary; clear bilaterally, Abdomen; large soft, bowel sounds are present she is tender across the upper abdomen particularly in the epigastrium and then right upper quadrant right mid quadrant and right lower quadrant there is no definite palpable mass or hepatosplenomegaly,, Rectal; exam not done, Extremities; no clubbing cyanosis or edema skin warm and  dry, Neuropsych; mood and affect appropriate       Assessment & Plan:   #29  70 year old female with CLL and hypogammaglobulinemia who presents with 4-5 week history of upper abdominal, right upper quadrant and right mid /and right lower quadrant abdominal pain, associated with bloating and burning. She has had intermittent diarrhea. Symptoms seem to be worse at night Etiology of symptoms is not clear-rule out intra-abdominal inflammatory process or malignancy, rule out colitis #2 status post cholecystectomy #3 morbid obesity #4 obstructive sleep apnea #5 hypertension  Plan; we'll schedule for CT of the abdomen and pelvis with oral but no IV contrast (secondary to allergy) Protonix 40 mg by mouth every morning Bentyl 10 mg by mouth 3 times a day when necessary for cramping/pain She has been taking a daily probiotic will use align 1 by mouth daily If CT is unrevealing, she will likely need colonoscopy and possible EGD, both of which were discussed today  Patient will be established with Dr. Hilarie Fredrickson.  Tamisha Nordstrom S Leotha Westermeyer PA-C 11/08/2016   Cc: [name], MD    Addendum: Reviewed and agree with initial management. Jerene Bears, MD

## 2016-11-08 NOTE — Patient Instructions (Addendum)
We sent prescriptions to [facility]  1. Pantoprazole sodium 40 mg. 2. Bentyl ( Dicyclomine) 10 mg  You may switch the probiotic to Align capsules over the counter.    You have been scheduled for a CT scan of the abdomen and pelvis at [facility] ([address]---this is in the same building as [facility]).   You are scheduled on Monday 11-14-2016 at 4:00 PM. You should arrive at 3:45 PM  to your appointment time for registration. Please follow the written instructions below on the day of your exam:  WARNING: IF YOU ARE ALLERGIC TO IODINE/X-RAY DYE, PLEASE NOTIFY RADIOLOGY IMMEDIATELY AT [phone]! YOU WILL BE GIVEN A 13 HOUR PREMEDICATION PREP.  1) Do not eat or drink anything after 12:00 noon (4 hours prior to your test) 2) You have been given 2 bottles of oral contrast to drink. The solution may taste better if refrigerated, but do NOT add ice or any other liquid to this solution. Shake well before drinking.    Drink 1 bottle of contrast @ 2:00 PM (2 hours prior to your exam)  Drink 1 bottle of contrast @ 3:00 PM (1 hour prior to your exam)  You may take any medications as prescribed with a small amount of water except for the following: Metformin, Glucophage, Glucovance, Avandamet, Riomet, Fortamet, Actoplus Met, Janumet, Glumetza or Metaglip. The above medications must be held the day of the exam AND 48 hours after the exam.  The purpose of you drinking the oral contrast is to aid in the visualization of your intestinal tract. The contrast solution may cause some diarrhea. Before your exam is started, you will be given a small amount of fluid to drink. Depending on your individual set of symptoms, you may also receive an intravenous injection of x-ray contrast/dye. Plan on being at [facility] for 30 minutes or long, depending on the type of exam you are having performed.  If you have any questions regarding your exam or if you need to  reschedule, you may call the CT department at [phone] between the hours of 8:00 am and 5:00 pm, Monday-Friday.  ________________________________________________________________________

## 2016-11-11 ENCOUNTER — Ambulatory Visit: Payer: 59

## 2016-11-14 ENCOUNTER — Other Ambulatory Visit: Payer: 59

## 2016-11-14 ENCOUNTER — Ambulatory Visit (INDEPENDENT_AMBULATORY_CARE_PROVIDER_SITE_OTHER)
Admission: RE | Admit: 2016-11-14 | Discharge: 2016-11-14 | Disposition: A | Discharge status: Home / Self Care | Payer: 59 | Source: Ambulatory Visit | Attending: Physician Assistant | Admitting: Physician Assistant

## 2016-11-14 DIAGNOSIS — Z856 Personal history of leukemia: Secondary | ICD-10-CM

## 2016-11-14 DIAGNOSIS — R1010 Upper abdominal pain, unspecified: Secondary | ICD-10-CM

## 2016-11-14 DIAGNOSIS — R197 Diarrhea, unspecified: Secondary | ICD-10-CM

## 2016-11-14 DIAGNOSIS — R1011 Right upper quadrant pain: Secondary | ICD-10-CM

## 2016-11-15 ENCOUNTER — Telehealth: Payer: Self-pay | Admitting: Physician Assistant

## 2016-11-15 ENCOUNTER — Other Ambulatory Visit: Payer: Self-pay | Admitting: Physician Assistant

## 2016-11-15 NOTE — Telephone Encounter (Signed)
advised

## 2016-11-15 NOTE — Telephone Encounter (Signed)
Xanax was called into [facility].

## 2016-11-17 ENCOUNTER — Other Ambulatory Visit: Payer: Self-pay

## 2016-11-17 ENCOUNTER — Telehealth: Payer: Self-pay | Admitting: Physician Assistant

## 2016-11-17 DIAGNOSIS — R1011 Right upper quadrant pain: Secondary | ICD-10-CM

## 2016-11-17 MED ORDER — NA SULFATE-K SULFATE-MG SULF 17.5-3.13-1.6 GM/177ML PO SOLN: 1.0000 | Freq: Once | ORAL | 0 refills | Status: AC

## 2016-11-17 NOTE — Telephone Encounter (Signed)
Yes, okay for work in if okay with [place]

## 2016-11-17 NOTE — Telephone Encounter (Signed)
Patient had left 2 voice mails yesterday because she was having so much abdominal pain. She stayed home from work. She has right sided abdominal pain. She did not get a call back yesterday, so she increased her Bentyl to every 3 to 4 hours. This did relieve her pain. She would like to go forward quickly with a colonoscopy and EGD. She uses a CPAP machine for OSA but is not on home oxygen.  Can I work in on the [place] schedule for 11/21/16?

## 2016-11-17 NOTE — Telephone Encounter (Signed)
Thank you Dr Pyrtle ... 

## 2016-11-18 ENCOUNTER — Other Ambulatory Visit (HOSPITAL_BASED_OUTPATIENT_CLINIC_OR_DEPARTMENT_OTHER): Payer: 59

## 2016-11-18 DIAGNOSIS — C9110 Chronic lymphocytic leukemia of B-cell type not having achieved remission: Secondary | ICD-10-CM | POA: Diagnosis not present

## 2016-11-18 LAB — CBC & DIFF AND RETIC
BASO%: 0.2 % (ref 0.0–2.0)
Basophils Absolute: 0.2 10*3/uL — ABNORMAL HIGH (ref 0.0–0.1)
EOS%: 0.3 % (ref 0.0–7.0)
Eosinophils Absolute: 0.3 10*3/uL (ref 0.0–0.5)
HCT: 33.9 % — ABNORMAL LOW (ref 34.8–46.6)
HGB: 11.2 g/dL — ABNORMAL LOW (ref 11.6–15.9)
Immature Retic Fract: 17 % — ABNORMAL HIGH (ref 1.60–10.00)
LYMPH%: 91.5 % — AB (ref 14.0–49.7)
MCH: 30.9 pg (ref 25.1–34.0)
MCHC: 33 g/dL (ref 31.5–36.0)
MCV: 93.4 fL (ref 79.5–101.0)
MONO#: 1.5 10*3/uL — ABNORMAL HIGH (ref 0.1–0.9)
MONO%: 1.6 % (ref 0.0–14.0)
NEUT%: 6.4 % — ABNORMAL LOW (ref 38.4–76.8)
NEUTROS ABS: 6 10*3/uL (ref 1.5–6.5)
PLATELETS: 89 10*3/uL — AB (ref 145–400)
RBC: 3.63 10*6/uL — AB (ref 3.70–5.45)
RDW: 14.3 % (ref 11.2–14.5)
Retic %: 2.34 % — ABNORMAL HIGH (ref 0.70–2.10)
Retic Ct Abs: 84.94 10*3/uL (ref 33.70–90.70)
WBC: 94.5 10*3/uL — AB (ref 3.9–10.3)
lymph#: 86.5 10*3/uL — ABNORMAL HIGH (ref 0.9–3.3)
nRBC: 0 % (ref 0–0)

## 2016-11-18 LAB — COMPREHENSIVE METABOLIC PANEL
ALBUMIN: 3.9 g/dL (ref 3.5–5.0)
ALK PHOS: 69 U/L (ref 40–150)
ALT: 19 U/L (ref 0–55)
ANION GAP: 10 meq/L (ref 3–11)
AST: 23 U/L (ref 5–34)
BILIRUBIN TOTAL: 0.57 mg/dL (ref 0.20–1.20)
BUN: 13.7 mg/dL (ref 7.0–26.0)
CO2: 24 meq/L (ref 22–29)
CREATININE: 0.9 mg/dL (ref 0.6–1.1)
Calcium: 9.4 mg/dL (ref 8.4–10.4)
Chloride: 103 mEq/L (ref 98–109)
EGFR: 69 mL/min/{1.73_m2} — AB (ref >90)
Glucose: 92 mg/dl (ref 70–140)
Potassium: 3.9 mEq/L (ref 3.5–5.1)
Sodium: 137 mEq/L (ref 136–145)
TOTAL PROTEIN: 6.9 g/dL (ref 6.4–8.3)

## 2016-11-18 LAB — TECHNOLOGIST REVIEW

## 2016-11-21 ENCOUNTER — Encounter: Payer: Self-pay | Admitting: Internal Medicine

## 2016-11-21 ENCOUNTER — Ambulatory Visit (AMBULATORY_SURGERY_CENTER): Payer: 59 | Admitting: Internal Medicine

## 2016-11-21 ENCOUNTER — Encounter: Payer: Self-pay | Admitting: Hematology

## 2016-11-21 VITALS — BP 159/68 | HR 69 | Temp 98.6°F | Resp 16 | Ht 62.5 in | Wt 211.0 lb

## 2016-11-21 DIAGNOSIS — D125 Benign neoplasm of sigmoid colon: Secondary | ICD-10-CM

## 2016-11-21 DIAGNOSIS — R1031 Right lower quadrant pain: Secondary | ICD-10-CM

## 2016-11-21 DIAGNOSIS — R1011 Right upper quadrant pain: Secondary | ICD-10-CM

## 2016-11-21 DIAGNOSIS — R197 Diarrhea, unspecified: Secondary | ICD-10-CM

## 2016-11-21 DIAGNOSIS — D120 Benign neoplasm of cecum: Secondary | ICD-10-CM | POA: Diagnosis not present

## 2016-11-21 DIAGNOSIS — R1013 Epigastric pain: Secondary | ICD-10-CM

## 2016-11-21 MED ORDER — SODIUM CHLORIDE 0.9 % IV SOLN: 500.0000 mL | INTRAVENOUS | Status: DC

## 2016-11-21 NOTE — Progress Notes (Signed)
Called to room to assist during endoscopic procedure.  Patient ID and intended procedure confirmed with present staff. Received instructions for my participation in the procedure from the performing physician.  

## 2016-11-21 NOTE — Progress Notes (Signed)
A/ox3, pleased with MAC, report to RN 

## 2016-11-21 NOTE — Op Note (Signed)
[facility] Patient Name: Rebecca Bond Procedure Date: 11/21/2016 3:00 PM MRN: [id number] Endoscopist: Jerene Bears , MD Age: 70 Referring MD:  Date of Birth: December 25, 1945 Gender: Female Account #: [id number] Procedure:                Upper GI endoscopy Indications:              Epigastric abdominal pain, Abdominal pain in the                            right upper quadrant, Abdominal pain in the right                            lower quadrant, Abdominal bloating Medicines:                Monitored Anesthesia Care Procedure:                Pre-Anesthesia Assessment:                           - Prior to the procedure, a History and Physical                            was performed, and patient medications and                            allergies were reviewed. The patient's tolerance of                            previous anesthesia was also reviewed. The risks                            and benefits of the procedure and the sedation                            options and risks were discussed with the patient.                            All questions were answered, and informed consent                            was obtained. Prior Anticoagulants: The patient has                            taken no previous anticoagulant or antiplatelet                            agents. ASA Grade Assessment: III - A patient with                            severe systemic disease. After reviewing the risks                            and benefits, the patient was deemed in  satisfactory condition to undergo the procedure.                           After obtaining informed consent, the endoscope was                            passed under direct vision. Throughout the                            procedure, the patient's blood pressure, pulse, and                            oxygen saturations were monitored continuously. The                            Model GIF-HQ190  [id number]) scope was introduced                            through the mouth, and advanced to the second part                            of duodenum. The upper GI endoscopy was                            accomplished without difficulty. The patient                            tolerated the procedure well. Scope In: Scope Out: Findings:                 The examined esophagus was normal.                           The entire examined stomach was normal. Biopsies                            were taken with a cold forceps for histology and                            Helicobacter pylori testing.                           The cardia and gastric fundus were normal on                            retroflexion.                           The examined duodenum was normal. Biopsies for                            histology were taken with a cold forceps for                            evaluation of celiac disease. Complications:  No immediate complications. Estimated Blood Loss:     Estimated blood loss was minimal. Impression:               - Normal esophagus.                           - Normal stomach. Biopsied.                           - Normal examined duodenum. Biopsied. Recommendation:           - Patient has a contact number available for                            emergencies. The signs and symptoms of potential                            delayed complications were discussed with the                            patient. Return to normal activities tomorrow.                            Written discharge instructions were provided to the                            patient.                           - Resume previous diet.                           - Continue present medications.                           - Await pathology results.                           - Perform a colonoscopy today. Jerene Bears, MD 11/21/2016 3:37:25 PM This report has been signed electronically.

## 2016-11-21 NOTE — Op Note (Signed)
[facility] Patient Name: Rebecca Bond Procedure Date: 11/21/2016 3:00 PM MRN: [id number] Endoscopist: Jerene Bears , MD Age: 70 Referring MD:  Date of Birth: 07/09/1946 Gender: Female Account #: [id number] Procedure:                Colonoscopy Indications:              Epigastric abdominal pain, Abdominal pain in the                            right lower quadrant, Abdominal pain in the right                            upper quadrant, Clinically significant diarrhea of                            unexplained origin Medicines:                Monitored Anesthesia Care Procedure:                Pre-Anesthesia Assessment:                           - Prior to the procedure, a History and Physical                            was performed, and patient medications and                            allergies were reviewed. The patient's tolerance of                            previous anesthesia was also reviewed. The risks                            and benefits of the procedure and the sedation                            options and risks were discussed with the patient.                            All questions were answered, and informed consent                            was obtained. Prior Anticoagulants: The patient has                            taken no previous anticoagulant or antiplatelet                            agents. ASA Grade Assessment: III - A patient with                            severe systemic disease. After reviewing the risks  and benefits, the patient was deemed in                            satisfactory condition to undergo the procedure.                           After obtaining informed consent, the colonoscope                            was passed under direct vision. Throughout the                            procedure, the patient's blood pressure, pulse, and                            oxygen saturations were monitored  continuously. The                            Model PCF-H190L [id number]) scope was introduced                            through the anus and advanced to the the terminal                            ileum. The colonoscopy was performed without                            difficulty. The patient tolerated the procedure                            well. The quality of the bowel preparation was                            good. The terminal ileum, ileocecal valve,                            appendiceal orifice, and rectum were photographed. Scope In: 3:17:34 PM Scope Out: 3:34:09 PM Scope Withdrawal Time: 0 hours 14 minutes 20 seconds  Total Procedure Duration: 0 hours 16 minutes 35 seconds  Findings:                 The digital rectal exam was normal.                           The terminal ileum appeared normal.                           Two sessile polyps were found in the cecum. The                            polyps were 3 to 7 mm in size. These polyps were                            removed with a cold snare. Resection and retrieval  were complete.                           A 2 mm polyp was found in the cecum. The polyp was                            sessile. The polyp was removed with a cold biopsy                            forceps. Resection and retrieval were complete.                           Two sessile polyps were found in the sigmoid colon.                            The polyps were 5 to 6 mm in size. These polyps                            were removed with a cold snare. Resection and                            retrieval were complete.                           The exam was otherwise without abnormality on                            direct and retroflexion views.                           Biopsies for histology were taken with a cold                            forceps from the right colon and left colon for                            evaluation of  microscopic colitis. Complications:            No immediate complications. Estimated Blood Loss:     Estimated blood loss was minimal. Impression:               - The examined portion of the ileum was normal.                           - Two 3 to 7 mm polyps in the cecum, removed with a                            cold snare. Resected and retrieved.                           - One 2 mm polyp in the cecum, removed with a cold  biopsy forceps. Resected and retrieved.                           - Two 5 to 6 mm polyps in the sigmoid colon,                            removed with a cold snare. Resected and retrieved.                           - The examination was otherwise normal on direct                            and retroflexion views.                           - Biopsies were taken with a cold forceps from the                            right colon and left colon for evaluation of                            microscopic colitis. Recommendation:           - Patient has a contact number available for                            emergencies. The signs and symptoms of potential                            delayed complications were discussed with the                            patient. Return to normal activities tomorrow.                            Written discharge instructions were provided to the                            patient.                           - Resume previous diet.                           - Continue present medications.                           - Await pathology results.                           - Repeat colonoscopy is recommended for                            surveillance. The colonoscopy date will be  determined after pathology results from today's                            exam become available for review. Jerene Bears, MD 11/21/2016 3:41:18 PM This report has been signed electronically.

## 2016-11-21 NOTE — Progress Notes (Signed)
Mailed FMLA competed by Dr. Marko Plume on 03/07/2016 to [employer] as requested.

## 2016-11-21 NOTE — Patient Instructions (Signed)
  Handouts given: Polyps.  YOU HAD AN ENDOSCOPIC PROCEDURE TODAY AT [facility]:   Refer to the procedure report that was given to you for any specific questions about what was found during the examination.  If the procedure report does not answer your questions, please call your gastroenterologist to clarify.  If you requested that your care partner not be given the details of your procedure findings, then the procedure report has been included in a sealed envelope for you to review at your convenience later.  YOU SHOULD EXPECT: Some feelings of bloating in the abdomen. Passage of more gas than usual.  Walking can help get rid of the air that was put into your GI tract during the procedure and reduce the bloating. If you had a lower endoscopy (such as a colonoscopy or flexible sigmoidoscopy) you may notice spotting of blood in your stool or on the toilet paper. If you underwent a bowel prep for your procedure, you may not have a normal bowel movement for a few days.  Please Note:  You might notice some irritation and congestion in your nose or some drainage.  This is from the oxygen used during your procedure.  There is no need for concern and it should clear up in a day or so.  SYMPTOMS TO REPORT IMMEDIATELY:   Following lower endoscopy (colonoscopy or flexible sigmoidoscopy):  Excessive amounts of blood in the stool  Significant tenderness or worsening of abdominal pains  Swelling of the abdomen that is new, acute  Fever of 100F or higher   Following upper endoscopy (EGD)  Vomiting of blood or coffee ground material  New chest pain or pain under the shoulder blades  Painful or persistently difficult swallowing  New shortness of breath  Fever of 100F or higher  Black, tarry-looking stools  For urgent or emergent issues, a gastroenterologist can be reached at any hour by calling [phone].   DIET:  We do recommend a small meal at first, but then you may proceed  to your regular diet.  Drink plenty of fluids but you should avoid alcoholic beverages for 24 hours.  ACTIVITY:  You should plan to take it easy for the rest of today and you should NOT DRIVE or use heavy machinery until tomorrow (because of the sedation medicines used during the test).    FOLLOW UP: Our staff will call the number listed on your records the next business day following your procedure to check on you and address any questions or concerns that you may have regarding the information given to you following your procedure. If we do not reach you, we will leave a message.  However, if you are feeling well and you are not experiencing any problems, there is no need to return our call.  We will assume that you have returned to your regular daily activities without incident.  If any biopsies were taken you will be contacted by phone or by letter within the next 1-3 weeks.  Please call us at [phone] if you have not heard about the biopsies in 3 weeks.    SIGNATURES/CONFIDENTIALITY: You and/or your care partner have signed paperwork which will be entered into your electronic medical record.  These signatures attest to the fact that that the information above on your After Visit Summary has been reviewed and is understood.  Full responsibility of the confidentiality of this discharge information lies with you and/or your care-partner.

## 2016-11-22 ENCOUNTER — Telehealth: Payer: Self-pay

## 2016-11-22 ENCOUNTER — Telehealth: Payer: Self-pay | Admitting: Physician Assistant

## 2016-11-22 NOTE — Telephone Encounter (Signed)
Name identifier. Left voicemail. Will call back later today.

## 2016-11-22 NOTE — Telephone Encounter (Signed)
Line busy

## 2016-11-22 NOTE — Telephone Encounter (Signed)
  Follow up Call-  Call back number 11/21/2016  Post procedure Call Back phone  # [phone]  Permission to leave phone message Yes  Some recent data might be hidden    Patient was called for follow up after her procedure on 11/21/2016. Patient reports that she has returned to her normal daily activities without out difficulty from the procedure. Patient does state that she continues to have cramping in her lower abdomen that she has continued to have for the last few months.  Patient already has a call into [facility] to see if she can increase Bentyl 10 mg to a few more times a day. I instructed the patient that Eustaquio Maize would call her back today and if she hasn't heard from her by 4:30 she could call back.      Patient questions:  Do you have a fever, pain , or abdominal swelling? Yes.   Pain Score  3 *  Have you tolerated food without any problems? Yes.    Have you been able to return to your normal activities? Yes.    Do you have any questions about your discharge instructions: Diet   No. Medications  No. Follow up visit  No.  Do you have questions or concerns about your Care? No.  Actions: * If pain score is 4 or above: No action needed, pain <4.

## 2016-11-24 ENCOUNTER — Encounter: Payer: Self-pay | Admitting: Physician Assistant

## 2016-11-24 ENCOUNTER — Ambulatory Visit (INDEPENDENT_AMBULATORY_CARE_PROVIDER_SITE_OTHER): Payer: 59 | Admitting: Physician Assistant

## 2016-11-24 VITALS — BP 160/100 | HR 80 | Temp 98.2°F | Resp 16 | Wt 210.6 lb

## 2016-11-24 DIAGNOSIS — Z79899 Other long term (current) drug therapy: Secondary | ICD-10-CM

## 2016-11-24 DIAGNOSIS — M25512 Pain in left shoulder: Secondary | ICD-10-CM | POA: Diagnosis not present

## 2016-11-24 DIAGNOSIS — I10 Essential (primary) hypertension: Secondary | ICD-10-CM

## 2016-11-24 DIAGNOSIS — R7303 Prediabetes: Secondary | ICD-10-CM

## 2016-11-24 DIAGNOSIS — E782 Mixed hyperlipidemia: Secondary | ICD-10-CM

## 2016-11-24 DIAGNOSIS — E559 Vitamin D deficiency, unspecified: Secondary | ICD-10-CM | POA: Diagnosis not present

## 2016-11-24 DIAGNOSIS — C9110 Chronic lymphocytic leukemia of B-cell type not having achieved remission: Secondary | ICD-10-CM

## 2016-11-24 DIAGNOSIS — F411 Generalized anxiety disorder: Secondary | ICD-10-CM

## 2016-11-24 LAB — CBC WITH DIFFERENTIAL/PLATELET
Basophils Absolute: 0 cells/uL (ref 0–200)
Basophils Relative: 0 %
Eosinophils Absolute: 0 cells/uL — ABNORMAL LOW (ref 15–500)
Eosinophils Relative: 0 %
HEMATOCRIT: 34.5 % — AB (ref 35.0–45.0)
Hemoglobin: 11.1 g/dL — ABNORMAL LOW (ref 11.7–15.5)
LYMPHS PCT: 91 %
Lymphs Abs: 79898 cells/uL — ABNORMAL HIGH (ref 850–3900)
MCH: 29.9 pg (ref 27.0–33.0)
MCHC: 32.2 g/dL (ref 32.0–36.0)
MCV: 93 fL (ref 80.0–100.0)
MONO ABS: 1756 {cells}/uL — AB (ref 200–950)
MPV: 8.9 fL (ref 7.5–12.5)
Monocytes Relative: 2 %
NEUTROS PCT: 7 %
Neutro Abs: 6146 cells/uL (ref 1500–7800)
Platelets: 110 10*3/uL — ABNORMAL LOW (ref 140–400)
RBC: 3.71 MIL/uL — AB (ref 3.80–5.10)
RDW: 14.8 % (ref 11.0–15.0)
WBC: 87.8 10*3/uL — AB (ref 3.8–10.8)

## 2016-11-24 LAB — TSH: TSH: 3.81 m[IU]/L

## 2016-11-24 MED ORDER — ESCITALOPRAM OXALATE 20 MG PO TABS: 20.0000 mg | ORAL_TABLET | Freq: Every day | ORAL | 2 refills | Status: DC

## 2016-11-24 MED ORDER — MINOXIDIL 2.5 MG PO TABS: 2.5000 mg | ORAL_TABLET | Freq: Every day | ORAL | 3 refills | Status: DC

## 2016-11-24 MED ORDER — DEXAMETHASONE SODIUM PHOSPHATE 100 MG/10ML IJ SOLN
10.0000 mg | Freq: Once | INTRAMUSCULAR | Status: AC
  Administered 2016-11-24: 10 mg via INTRAMUSCULAR

## 2016-11-24 NOTE — Telephone Encounter (Signed)
Discussed with Amy Elias-Fela Solis. The patient may increase the frequency of dosing and stay with the same strength of medication. Information left on her voicemail to increase the Bentyl 10 mg every 4 to 6 hours or before each meal and an extra dose if needed. May take up to 5 per day.

## 2016-11-24 NOTE — Telephone Encounter (Signed)
Spoke with Ms. Kenerson. She states Bentyl has helped with her abdominal cramping, but it wears off. Sometimes by the time she can take her evening dose, she is in pain.  Should she increase the frequency or should we increase the strength?  She has had her EGD and colonoscopy. Pathology is pending.

## 2016-11-24 NOTE — Progress Notes (Signed)
Assessment and Plan:   Essential hypertension Add on medication, monitor at home, close follow up 2 weeks If any CP, SOB, HA, dizziness, etc go to ER, patient expressed understands -     minoxidil (LONITEN) 2.5 MG tablet; Take 1 tablet (2.5 mg total) by mouth daily. -     CBC with Differential/Platelet -     BASIC METABOLIC PANEL WITH GFR -     Hepatic function panel -     TSH  Mixed hyperlipidemia -continue medications, check lipids, decrease fatty foods, increase activity.  -     Lipid panel  Prediabetes Discussed general issues about diabetes pathophysiology and management., Educational material distributed., Suggested low cholesterol diet., Encouraged aerobic exercise., Discussed foot care., Reminded to get yearly retinal exam. -     Hemoglobin A1c  Vitamin D deficiency  CLL (chronic lymphocytic leukemia) ([place]) Continue follow up oncology  Medication management  Morbid Obesity with co morbidities - long discussion about weight loss, diet, and exercise  Generalized anxiety disorder Patient very overwhelmed right now with CLL, pain, illness, and work- ? If some of the pain is not psychosomatic, will add on 1/2 of lexapro, if she does not do well with this will try brintillex, suggest counseling, no SI/HI Offered short term disability while seeing counselor, patient declines -     escitalopram (LEXAPRO) 20 MG tablet; Take 1 tablet (20 mg total) by mouth daily. -     Hemoglobin A1c  Acute pain of left shoulder A trigger point injection was performed at the site of maximal tenderness using 1% plain Lidocaine and dexamethasone. This was well tolerated, and followed by immediate relief of pain. Discussed possible AE's including tendon rupture, patient gave verbal consent.  -     dexamethasone (DECADRON) injection 10 mg; Inject 1 mL (10 mg total) into the muscle once.   Continue diet and meds as discussed. Further disposition pending results of labs. OVER 40 minutes of exam,  counseling, chart review, referral performed   HPI 70 y.o. female  presents for over due 3 month follow up, last OV was in 09/2014 for CPE- follows up with hypertension, hyperlipidemia, prediabetes, Vitamin D, and complicated history of CLL .   Her blood pressure has not been controlled at home, she went in to see Dr. Claiborne Billings and saw his PA for elevated BP,  She had a normal stress test 2014, cath 2003, today their BP is BP: (!) 160/100 (2nd time: 159 98) She has right going pain, across her back, chest pain, had normal CT pelvis without contrast, has had colonoscopy and EGD this past week with GI, pending BX, has had diarrhea. Continues to have RUQ pain to back.   She has some leg swelling but only on HCTZ every other day due to hyponatremia  She has FMLA and feels that they are giving her more labs than other people to try to push her out or make her quit but she needs job due to insurance. She took xanax recently. She is crying, states she is overwhelmed with illness/work, she does not want to get out of bed, denies SI/GI.   She complains of left shoulder pain, pain worse with lifting her arm, anterior shoulder.    She does not workout. She denies chest pain, shortness of breath, dizziness.  She is on cholesterol medication and denies myalgias. Her cholesterol is at goal. The cholesterol last visit was:   Lab Results  Component Value Date   CHOL 175 05/30/2016  HDL 57 05/30/2016   LDLCALC 92 05/30/2016   TRIG 132 05/30/2016   CHOLHDL 3.1 05/30/2016    She has been working on diet and exercise for prediabetes, and denies paresthesia of the feet, polydipsia, polyuria and visual disturbances. Last A1C in the office was:  Lab Results  Component Value Date   HGBA1C 5.7 (H) 05/30/2016   Patient is on Vitamin D supplement.   Lab Results  Component Value Date   VD25OH 38 05/30/2016     BMI is Body mass index is 37.91 kg/m., she is working on diet and exercise.+ OSA on CPAP.  Wt Readings  from Last 3 Encounters:  11/24/16 210 lb 9.6 oz (95.5 kg)  11/21/16 211 lb (95.7 kg)  11/08/16 211 lb (95.7 kg)  Patient was dx'd with CLL in Dec 2009 and been monitored by Dr Beryle Beams and more recently by Dr Marko Plume. She is receiving IVIG, she had a reaction in Oct/sept and had to be on steroids.  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 Lab Results  Component Value Date   WBC 94.5 (HH) 11/18/2016   HGB 11.2 (L) 11/18/2016   HCT 33.9 (L) 11/18/2016   MCV 93.4 11/18/2016   PLT 89 (L) 11/18/2016     Current Medications:  Current Outpatient Prescriptions on File Prior to Visit  Medication Sig Dispense Refill  . ALPRAZolam (XANAX) 1 MG tablet TAKE 1/2 TO 1 TABLET 3 TIMES A DAY AS NEEDED FOR ANXIETY 90 tablet 0  . Ascorbic Acid (VITAMIN C) 1000 MG tablet Take 1,000 mg by mouth daily.    . bisoprolol-hydrochlorothiazide (ZIAC) 10-6.25 MG tablet take 1 tablet by mouth once daily 90 tablet 1  . buPROPion (WELLBUTRIN XL) 150 MG 24 hr tablet take 1 tablet by mouth once daily 90 tablet 1  . Cholecalciferol (VITAMIN D PO) Take 10,000 Units by mouth daily.     [name] Kitchen dicyclomine (BENTYL) 10 MG capsule Take 1 tab 3 times daily as needed for abdominal pain and cramping. 90 capsule 1  . fexofenadine (ALLEGRA) 180 MG tablet Take 1 tablet (180 mg total) by mouth daily. Take PRN for allergies. 30 tablet 6  . FLORASTOR 250 MG capsule Take 250 mg by mouth daily.   1  . hydrochlorothiazide (HYDRODIURIL) 12.5 MG tablet TAKE 1 TABLET BY MOUTH ONCE DAILY 90 tablet 1  . OVER THE COUNTER MEDICATION Nasocort daily as needed    . OVER THE COUNTER MEDICATION Sustain Ultra Eye drops    . pantoprazole (PROTONIX) 40 MG tablet Take 1 tab in the morning daily. 90 tablet 3  . ZETIA 10 MG tablet take 1 tablet by mouth at bedtime 90 tablet 1   Current Facility-Administered Medications on File Prior to Visit  Medication Dose Route Frequency Provider Last Rate Last Dose  . 0.9 %  sodium chloride infusion  500 mL Intravenous  Continuous Jerene Bears, MD       Medical History:  Past Medical History:  Diagnosis Date  . Anxiety   . Benign essential HTN 11/22/2011  . CLL (chronic lymphocytic leukemia) ([place])   . Depression   . GERD (gastroesophageal reflux disease)   . 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 High cholesterol   . History of bronchitis 08/02/2012   "maybe once/yr if that much; last time 01/2010"  . 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 OSA (obstructive sleep apnea)   . Pneumonia 01/2010  . Prediabetes   . Recurrent sinus infections 08/02/2012  Allergies:  Allergies  Allergen Reactions  . Hyzaar [Losartan Potassium-Hctz] Other (See Comments)    "messed up my sodium counts" swells up lips.  [name] [Pseudoephedrine Hcl] Palpitations  . Biaxin [Clarithromycin]     GI Upset  . Celexa [Citalopram]   . Flexeril [Cyclobenzaprine]     "zombie-like" feeling  . Fosamax [Alendronate Sodium]     GI upset  . Iohexol Hives     Code: HIVES, Desc: pt states she broke out in hive 20 yrs ago from IV contrast.     . Losartan     Angioedema  . Meloxicam     GI upset  . Norvasc [Amlodipine] Swelling  . Pseudoephedrine     Palpitations  . Zoloft [Sertraline Hcl]      Review of Systems:  Review of Systems  Constitutional: Positive for malaise/fatigue. Negative for chills, diaphoresis, fever and weight loss.  HENT: Negative for congestion, ear discharge, ear pain, hearing loss, nosebleeds, sore throat and tinnitus.        + TMJ  Eyes: Negative.  Negative for blurred vision and double vision.  Respiratory: Positive for shortness of breath. Negative for cough, hemoptysis, sputum production, wheezing and stridor.   Cardiovascular: Positive for chest pain. Negative for palpitations, orthopnea, claudication, leg swelling and PND.  Gastrointestinal: Positive for heartburn. Negative for abdominal pain, blood in stool, constipation, diarrhea, melena, nausea and vomiting.   Genitourinary: Negative for dysuria, flank pain, frequency, hematuria and urgency.  Musculoskeletal: Positive for back pain, joint pain (left shoulder) and myalgias. Negative for falls and neck pain.  Skin: Negative.   Neurological: Negative for dizziness, tingling, tremors, sensory change, speech change, focal weakness, seizures, loss of consciousness, weakness and headaches.  Endo/Heme/Allergies: Negative for environmental allergies and polydipsia. Does not bruise/bleed easily.  Psychiatric/Behavioral: Positive for depression. Negative for hallucinations, memory loss, substance abuse and suicidal ideas. The patient is nervous/anxious. The patient does not have insomnia.     Family history- Review and unchanged Social history- Review and unchanged Physical Exam: BP (!) 160/100 Comment: 2nd time: 159 98  Pulse 80   Temp 98.2 F (36.8 C)   Resp 16   Wt 210 lb 9.6 oz (95.5 kg)   SpO2 97%   BMI 37.91 kg/m  Wt Readings from Last 3 Encounters:  11/24/16 210 lb 9.6 oz (95.5 kg)  11/21/16 211 lb (95.7 kg)  11/08/16 211 lb (95.7 kg)   General Appearance: Well nourished, in no apparent distress, no diaphroesis Eyes: PERRLA, EOMs, conjunctiva no swelling or erythema Sinuses: + Frontal/maxillary tenderness ENT/Mouth: Ext aud canals clear, TMs without erythema, bulging. No erythema, swelling, or exudate on post pharynx.  Tonsils not swollen or erythematous. Hearing normal. + TMJ tenderness Neck: Supple, thyroid normal.  Respiratory: Respiratory effort normal, BS equal bilaterally without rales, rhonchi, wheezing or stridor.  Cardio: RRR with no MRGs. Brisk peripheral pulses without edema.  Abdomen: Soft, + BS, obese, diffusely overly tender to palpation, no peritoneal signs, + guarding, without rebound, hernias, masses. Lymphatics: Non tender without lymphadenopathy.  Musculoskeletal: Full ROM, 5/5 strength, Normal gait,+ chest wall tenderness, Left crepitus with ROM, sensory exam normal,  motor exam normal, radial pulse intact and no deformity, + tenderness over subacromial bursa and biceps tendon, pain with abduction to 90 degree  Strength is normal and symmetric in arms. Skin:  Warm, dry without rashes, lesions, ecchymosis.  Neuro: Cranial nerves intact. Normal muscle tone, no cerebellar symptoms. Psych: Awake and oriented X 3, normal affect, appears anxious, Insight  and Judgment appropriate.    Vicie Mutters, PA-C 6:02 PM [facility]

## 2016-11-24 NOTE — Patient Instructions (Signed)
Get on lexapro 1/2 pill a day, can add on to wellbutrin or stop the wellbutrin Suggest counseling Will do close follow pu  Add on very low dose of minoxidil 2.5mg  to take daily.   Monitor your blood pressure at home. Go to the ER if any CP, SOB, nausea, dizziness, severe HA, changes vision/speech  Goal BP:  For patients younger than 60: Goal BP < 140/90. For patients 60 and older: Goal BP < 150/90. For patients with diabetes: Goal BP < 140/90. Your most recent BP: BP: (!) 160/100 (2nd time: 159 98)   Take your medications faithfully as instructed. Maintain a healthy weight. Get at least 150 minutes of aerobic exercise per week. Minimize salt intake. Minimize alcohol intake  DASH Eating Plan DASH stands for "Dietary Approaches to Stop Hypertension." The DASH eating plan is a healthy eating plan that has been shown to reduce high blood pressure (hypertension). Additional health benefits may include reducing the risk of type 2 diabetes mellitus, heart disease, and stroke. The DASH eating plan may also help with weight loss. WHAT DO I NEED TO KNOW ABOUT THE DASH EATING PLAN? For the DASH eating plan, you will follow these general guidelines:  Choose foods with a percent daily value for sodium of less than 5% (as listed on the food label).  Use salt-free seasonings or herbs instead of table salt or sea salt.  Check with your health care provider or pharmacist before using salt substitutes.  Eat lower-sodium products, often labeled as "lower sodium" or "no salt added."  Eat fresh foods.  Eat more vegetables, fruits, and low-fat dairy products.  Choose whole grains. Look for the word "whole" as the first word in the ingredient list.  Choose fish and skinless chicken or [place] more often than red meat. Limit fish, poultry, and meat to 6 oz (170 g) each day.  Limit sweets, desserts, sugars, and sugary drinks.  Choose heart-healthy fats.  Limit cheese to 1 oz (28 g) per day.  Eat  more home-cooked food and less restaurant, buffet, and fast food.  Limit fried foods.  Cook foods using methods other than frying.  Limit canned vegetables. If you do use them, rinse them well to decrease the sodium.  When eating at a restaurant, ask that your food be prepared with less salt, or no salt if possible. WHAT FOODS CAN I EAT? Seek help from a dietitian for individual calorie needs. Grains Whole grain or whole wheat bread. Brown rice. Whole grain or whole wheat pasta. Quinoa, bulgur, and whole grain cereals. Low-sodium cereals. Corn or whole wheat flour tortillas. Whole grain cornbread. Whole grain crackers. Low-sodium crackers. Vegetables Fresh or frozen vegetables (raw, steamed, roasted, or grilled). Low-sodium or reduced-sodium tomato and vegetable juices. Low-sodium or reduced-sodium tomato sauce and paste. Low-sodium or reduced-sodium canned vegetables.  Fruits All fresh, canned (in natural juice), or frozen fruits. Meat and Other Protein Products Ground beef (85% or leaner), grass-fed beef, or beef trimmed of fat. Skinless chicken or [place]. Ground chicken or [place]. Pork trimmed of fat. All fish and seafood. Eggs. Dried beans, peas, or lentils. Unsalted nuts and seeds. Unsalted canned beans. Dairy Low-fat dairy products, such as skim or 1% milk, 2% or reduced-fat cheeses, low-fat ricotta or cottage cheese, or plain low-fat yogurt. Low-sodium or reduced-sodium cheeses. Fats and Oils Tub margarines without trans fats. Light or reduced-fat mayonnaise and salad dressings (reduced sodium). Avocado. Safflower, olive, or canola oils. Natural peanut or almond butter. Other Unsalted popcorn and pretzels.  The items listed above may not be a complete list of recommended foods or beverages. Contact your dietitian for more options. WHAT FOODS ARE NOT RECOMMENDED? Grains White bread. White pasta. White rice. Refined cornbread. Bagels and croissants. Crackers that contain trans  fat. Vegetables Creamed or fried vegetables. Vegetables in a cheese sauce. Regular canned vegetables. Regular canned tomato sauce and paste. Regular tomato and vegetable juices. Fruits Dried fruits. Canned fruit in light or heavy syrup. Fruit juice. Meat and Other Protein Products Fatty cuts of meat. Ribs, chicken wings, bacon, sausage, bologna, salami, chitterlings, fatback, hot dogs, bratwurst, and packaged luncheon meats. Salted nuts and seeds. Canned beans with salt. Dairy Whole or 2% milk, cream, half-and-half, and cream cheese. Whole-fat or sweetened yogurt. Full-fat cheeses or blue cheese. Nondairy creamers and whipped toppings. Processed cheese, cheese spreads, or cheese curds. Condiments Onion and garlic salt, seasoned salt, table salt, and sea salt. Canned and packaged gravies. Worcestershire sauce. Tartar sauce. Barbecue sauce. Teriyaki sauce. Soy sauce, including reduced sodium. Steak sauce. Fish sauce. Oyster sauce. Cocktail sauce. Horseradish. Ketchup and mustard. Meat flavorings and tenderizers. Bouillon cubes. Hot sauce. Tabasco sauce. Marinades. Taco seasonings. Relishes. Fats and Oils Butter, stick margarine, lard, shortening, ghee, and bacon fat. Coconut, palm kernel, or palm oils. Regular salad dressings. Other Pickles and olives. Salted popcorn and pretzels. The items listed above may not be a complete list of foods and beverages to avoid. Contact your dietitian for more information. WHERE CAN I FIND MORE INFORMATION? National Heart, Lung, and Blood Institute: travelstabloid.com Document Released: 11/10/2011 Document Revised: 04/07/2014 Document Reviewed: 09/25/2013 [facility] Patient Information 2015 [place], [place]. This information is not intended to replace advice given to you by your health care provider. Make sure you discuss any questions you have with your health care provider.

## 2016-11-25 LAB — BASIC METABOLIC PANEL WITH GFR
BUN: 16 mg/dL (ref 7–25)
CALCIUM: 9.6 mg/dL (ref 8.6–10.4)
CO2: 19 mmol/L — ABNORMAL LOW (ref 20–31)
CREATININE: 0.91 mg/dL (ref 0.60–0.93)
Chloride: 102 mmol/L (ref 98–110)
GFR, EST AFRICAN AMERICAN: 74 mL/min (ref >=60)
GFR, EST NON AFRICAN AMERICAN: 64 mL/min (ref >=60)
GLUCOSE: 97 mg/dL (ref 65–99)
Potassium: 3.5 mmol/L (ref 3.5–5.3)
Sodium: 137 mmol/L (ref 135–146)

## 2016-11-25 LAB — HEMOGLOBIN A1C
Hgb A1c MFr Bld: 5 % (ref <5.7)
Mean Plasma Glucose: 97 mg/dL

## 2016-11-25 LAB — LIPID PANEL
CHOLESTEROL: 190 mg/dL (ref <200)
HDL: 48 mg/dL — ABNORMAL LOW (ref >50)
LDL Cholesterol: 111 mg/dL — ABNORMAL HIGH (ref <100)
TRIGLYCERIDES: 153 mg/dL — AB (ref <150)
Total CHOL/HDL Ratio: 4 Ratio (ref <5.0)
VLDL: 31 mg/dL — ABNORMAL HIGH (ref <30)

## 2016-11-25 LAB — HEPATIC FUNCTION PANEL
ALBUMIN: 4.6 g/dL (ref 3.6–5.1)
ALT: 19 U/L (ref 6–29)
AST: 27 U/L (ref 10–35)
Alkaline Phosphatase: 58 U/L (ref 33–130)
BILIRUBIN INDIRECT: 0.5 mg/dL (ref 0.2–1.2)
Bilirubin, Direct: 0.1 mg/dL (ref <=0.2)
TOTAL PROTEIN: 6.9 g/dL (ref 6.1–8.1)
Total Bilirubin: 0.6 mg/dL (ref 0.2–1.2)

## 2016-11-30 ENCOUNTER — Other Ambulatory Visit: Payer: Self-pay

## 2016-11-30 ENCOUNTER — Telehealth: Payer: Self-pay

## 2016-11-30 DIAGNOSIS — R197 Diarrhea, unspecified: Secondary | ICD-10-CM

## 2016-11-30 NOTE — Telephone Encounter (Signed)
[name] stating that she has been having really bad diarrhea  One of her meds are working per pt Stays in restroom/dehydrated & worn out  Per provider pt should go to ER- may need fluids   LVM for pt to return phone call & I also informed pt of this information as well.

## 2016-12-01 ENCOUNTER — Encounter: Payer: Self-pay | Admitting: Physician Assistant

## 2016-12-01 ENCOUNTER — Ambulatory Visit (INDEPENDENT_AMBULATORY_CARE_PROVIDER_SITE_OTHER): Payer: 59 | Admitting: Physician Assistant

## 2016-12-01 ENCOUNTER — Other Ambulatory Visit: Payer: 59

## 2016-12-01 VITALS — BP 140/82 | HR 66 | Temp 98.6°F | Resp 14 | Ht 62.5 in | Wt 209.0 lb

## 2016-12-01 DIAGNOSIS — C9110 Chronic lymphocytic leukemia of B-cell type not having achieved remission: Secondary | ICD-10-CM | POA: Diagnosis not present

## 2016-12-01 DIAGNOSIS — R197 Diarrhea, unspecified: Secondary | ICD-10-CM

## 2016-12-01 DIAGNOSIS — R1011 Right upper quadrant pain: Secondary | ICD-10-CM | POA: Diagnosis not present

## 2016-12-01 DIAGNOSIS — J0100 Acute maxillary sinusitis, unspecified: Secondary | ICD-10-CM | POA: Diagnosis not present

## 2016-12-01 DIAGNOSIS — F411 Generalized anxiety disorder: Secondary | ICD-10-CM | POA: Diagnosis not present

## 2016-12-01 DIAGNOSIS — I10 Essential (primary) hypertension: Secondary | ICD-10-CM

## 2016-12-01 MED ORDER — PREDNISONE 20 MG PO TABS: ORAL_TABLET | ORAL | 0 refills | Status: DC

## 2016-12-01 MED ORDER — AZITHROMYCIN 250 MG PO TABS: ORAL_TABLET | ORAL | 1 refills | Status: AC

## 2016-12-01 MED ORDER — ONDANSETRON HCL 4 MG PO TABS: 4.0000 mg | ORAL_TABLET | Freq: Every day | ORAL | 1 refills | Status: DC | PRN

## 2016-12-01 NOTE — Patient Instructions (Addendum)
Stop the lexapro for 1 week Start 1/2 of the lexapro at that time  Will give you samples of viberzi for IBS D Follow up with Dr. Raquel James  HIGHLY RECOMMEND A COUNSELOR WHILE OUT ON LEAVE  Counseling services  Dr. Arbutus Leas, Ph.D. [address] [place] [address] Phone: [place], [place] [phone] [facility] [address] [place] [address]   [facility] Hours: Monday-Thursday 830-8pm  Friday 830AM-7PM Address: [place] [phone] [place].  Address: [place], [place] [address] [facility] [phone] office [email or web address] [address] [place], [place], [place] [address]  Rema Fendt, therapist  Toy Cookey, MA, clinical psychologist  Cognitive-Behavior Therapy; Mood Disorders; Anxiety Disorders; adult and child ADHD; Family Therapy; Stress Management; personal growth, and Marital Therapy.    Terrance Mass Ph.D., clinical psychologist Cognitive-Behavior Therapy; Mood Disorders; Anxiety Disorders; Stress     Management  [facility] [address], [place], [place] [address] [phone]   [facility] [place], psychotherapist [address] [place], [place] [address] [phone]  Karin Golden Ph.D., clinical psychologist [phone] office [place], [place] [address] Cognitive Behavior Therapy, Depression, Bipolar, Anxiety, Grief and Loss

## 2016-12-01 NOTE — Progress Notes (Signed)
Subjective:    Patient ID: Rebecca Bond, female    DOB: 1946/04/05, 70 y.o.   MRN: [id number]  HPI 70 y.o. obese WF with history of CLL, anxiety presents with feeling sick. She has had sore throat, sinus issues, diarrhea. After taking dicyclomine she no longer has diarrhea, but continues to have the right upper flank pain.  She started lexapro x 1 week, still crying, states she is physically and emotionally spent, she goes next week to the cancer center for [name] and may start pills or chemotherapy. She has physical weakness and stress, states that work has been putting extra calls on her and she is unable to concentrate.   She has had a normal colonoscopy (will repeat 3 years due to polyps), she has had normal AB [place], normal CT AB with and without contrast, and she is following with Dr. Raquel James. She has diarrhea or chronic constipation, goes back and forth with this right AB pain.   Blood pressure 140/82, pulse 66, temperature 98.6 F (37 C), resp. rate 14, height 5' 2.5" (1.588 m), weight 209 lb (94.8 kg), SpO2 95 %.  Medications Current Outpatient Prescriptions on File Prior to Visit  Medication Sig  . ALPRAZolam (XANAX) 1 MG tablet TAKE 1/2 TO 1 TABLET 3 TIMES A DAY AS NEEDED FOR ANXIETY  . Ascorbic Acid (VITAMIN C) 1000 MG tablet Take 1,000 mg by mouth daily.  . bisoprolol-hydrochlorothiazide (ZIAC) 10-6.25 MG tablet take 1 tablet by mouth once daily  . buPROPion (WELLBUTRIN XL) 150 MG 24 hr tablet take 1 tablet by mouth once daily  . Cholecalciferol (VITAMIN D PO) Take 10,000 Units by mouth daily.   [name] Kitchen dicyclomine (BENTYL) 10 MG capsule Take 1 tab 3 times daily as needed for abdominal pain and cramping.  . escitalopram (LEXAPRO) 20 MG tablet Take 1 tablet (20 mg total) by mouth daily.  . fexofenadine (ALLEGRA) 180 MG tablet Take 1 tablet (180 mg total) by mouth daily. Take PRN for allergies.  [name] Kitchen FLORASTOR 250 MG capsule Take 250 mg by mouth daily.   . hydrochlorothiazide  (HYDRODIURIL) 12.5 MG tablet TAKE 1 TABLET BY MOUTH ONCE DAILY  . minoxidil (LONITEN) 2.5 MG tablet Take 1 tablet (2.5 mg total) by mouth daily.  [name] Kitchen OVER THE COUNTER MEDICATION Nasocort daily as needed  . OVER THE COUNTER MEDICATION Sustain Ultra Eye drops  . pantoprazole (PROTONIX) 40 MG tablet Take 1 tab in the morning daily.  [name] Kitchen ZETIA 10 MG tablet take 1 tablet by mouth at bedtime   Current Facility-Administered Medications on File Prior to Visit  Medication  . 0.9 %  sodium chloride infusion    Problem list She has CLL (chronic lymphocytic leukemia) ([place]); Hepatitis A; Morbid obesity (BMI 37.95); Angioedema secondary to ACE/ARB; Prediabetes; Depression, controlled; GERD (gastroesophageal reflux disease); Recurrent sinus infections; OSA (obstructive sleep apnea); HTN (hypertension); Vitamin D deficiency; Hypogammaglobulinemia ([place]); Mixed hyperlipidemia; Medication management; Environmental and seasonal allergies; BMI 37.85,   adult; Immunocompromised ([place]); Thrombocytopenia ([place]); Dehydration; and Headache on her problem list.   Review of Systems  Constitutional: Negative for chills, diaphoresis, fatigue and fever.  HENT: Negative.   Respiratory: Negative.  Negative for cough.   Cardiovascular: Negative.   Gastrointestinal: Positive for abdominal distention, abdominal pain, constipation, diarrhea and nausea. Negative for anal bleeding, blood in stool, rectal pain and vomiting.       + GERD  Genitourinary: Negative for difficulty urinating, dysuria, flank pain, frequency, hematuria, pelvic pain and urgency.  Musculoskeletal: Positive  for back pain. Negative for arthralgias, gait problem, joint swelling, myalgias, neck pain and neck stiffness.  Skin: Negative.   Neurological: Negative for dizziness and headaches.       Objective:   Physical Exam  Constitutional: She is oriented to person, place, and time. She appears well-developed and well-nourished. No distress.  Cardiovascular:  Normal rate and regular rhythm.   No murmur heard. Pulmonary/Chest: Effort normal and breath sounds normal. She has no wheezes.  Abdominal: Soft. She exhibits no distension and no mass. There is tenderness (epigastric and RUQ). There is guarding. There is no rebound.  Musculoskeletal: Normal range of motion. She exhibits no tenderness.  No CVA tenderness  Neurological: She is alert and oriented to person, place, and time.  Skin: Skin is warm and dry. No rash noted.      Assessment & Plan:  1. Essential hypertension Need montior, close follow up  2. CLL (chronic lymphocytic leukemia) ([place]) Follow up oncolgy  3. Generalized anxiety disorder Will do 6 week short term disability Need to see counseling Follow up 2-4 weeks  4. Abdominal pain, right upper quadrant - ondansetron (ZOFRAN) 4 MG tablet; Take 1 tablet (4 mg total) by mouth daily as needed for nausea or vomiting.  Dispense: 30 tablet; Refill: 1  5. Acute maxillary sinusitis, recurrence not specified - predniSONE (DELTASONE) 20 MG tablet; 2 tablets daily for 3 days, 1 tablet daily for 4 days.  Dispense: 10 tablet; Refill: 0 - azithromycin (ZITHROMAX) 250 MG tablet; Take 2 tablets (500 mg) on  Day 1,  followed by 1 tablet (250 mg) once daily on Days 2 through 5.  Dispense: 6 each; Refill: 1

## 2016-12-02 LAB — FECAL LACTOFERRIN, QUANT: Lactoferrin: NEGATIVE

## 2016-12-04 NOTE — Progress Notes (Signed)
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 (patient reports -planned for August 2017) 13q deletion dose pre-dispose her to recurrent SCC  Current Treatment: observation  Previous treatment: IVIG for several months last winter to reduce recurrent respiratory infections (Patient notes that this helped) Has no required definitive treatment for CLL at this time  INTERVAL HISTORY:  Patient is here for continued management of [name]. She notes no acute new symptoms. No fevers no chills no night sweats no overt new enlarged lymph nodes. No abdominal pain.  She is healing from her Mohs surgery for SCC On nose. Received peri-op antibiotics. After discussing the pros and cons ofr IVIG (given her headache issues previously) she opts to take this again. We will spread it out to q6 weeks.   REVIEW OF SYSTEMS:    10 Point review of systems of done and is negative except as noted above.  . Past Medical History:  Diagnosis Date  . Anxiety   . Benign essential HTN 11/22/2011  . CLL (chronic lymphocytic leukemia) ([place])   . Depression   . GERD (gastroesophageal reflux disease)   . 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 High cholesterol   . History of bronchitis 08/02/2012   "maybe once/yr if that much; last time 01/2010"  . 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 OSA (obstructive sleep apnea)   . Pneumonia 01/2010  . Prediabetes   . Recurrent sinus infections 08/02/2012    . Past Surgical History:  Procedure Laterality Date  . ABDOMINAL HYSTERECTOMY  1980's   "endometrosis"  . BREAST SURGERY    . CHOLECYSTECTOMY  1985  . FUNCTIONAL ENDOSCOPIC SINUS SURGERY  1990's   "cause I kept having sinus  infections"  . immunoglobulin treatment  2017  . LAPAROSCOPIC APPENDECTOMY N/A 06/04/2014   Procedure: APPENDECTOMY LAPAROSCOPIC;  Surgeon: Zenovia Jarred, MD;  Location: [facility];  Service: General;  Laterality: N/A;  . LYMPH NODE BIOPSY     "determined I had CLL"  . TEMPOROMANDIBULAR JOINT ARTHROPLASTY  1980's  . TONSILLECTOMY AND ADENOIDECTOMY  1950's    . Social History  Substance Use Topics  . Smoking status: Former Smoker    Packs/day: 0.75    Years: 4.00    Types: Cigarettes    Quit date: 12/05/1968  . Smokeless tobacco: Never Used  . Alcohol use 8.4 oz/week    14 Glasses of wine per week     Comment: 08/02/2012 "couple glasses wine q hs; last time 2-3 wk ago"    ALLERGIES:  is allergic to hyzaar [losartan potassium-hctz]; sudafed [pseudoephedrine hcl]; biaxin [clarithromycin]; celexa [citalopram]; flexeril [cyclobenzaprine]; fosamax [alendronate sodium]; iohexol; losartan; meloxicam; norvasc [amlodipine]; pseudoephedrine; and zoloft [sertraline hcl].  MEDICATIONS:  Current Outpatient Prescriptions  Medication Sig Dispense Refill  . ALPRAZolam (XANAX) 1 MG tablet TAKE 1/2 TO 1 TABLET 3 TIMES A DAY AS NEEDED FOR ANXIETY 90 tablet 0  . Ascorbic Acid (VITAMIN C) 1000 MG tablet Take 1,000 mg by mouth daily.    [name] Kitchen azithromycin (ZITHROMAX) 250 MG tablet Take 2 tablets (500 mg) on  Day 1,  followed by 1 tablet (250 mg) once daily on Days 2 through 5. 6 each 1  . bisoprolol-hydrochlorothiazide (  ZIAC) 10-6.25 MG tablet take 1 tablet by mouth once daily 90 tablet 1  . buPROPion (WELLBUTRIN XL) 150 MG 24 hr tablet take 1 tablet by mouth once daily 90 tablet 1  . Cholecalciferol (VITAMIN D PO) Take 10,000 Units by mouth daily.     [name] Kitchen dicyclomine (BENTYL) 10 MG capsule Take 1 tab 3 times daily as needed for abdominal pain and cramping. 90 capsule 1  . escitalopram (LEXAPRO) 20 MG tablet Take 1 tablet (20 mg total) by mouth daily. 30 tablet 2  . fexofenadine (ALLEGRA) 180 MG tablet Take 1  tablet (180 mg total) by mouth daily. Take PRN for allergies. 30 tablet 6  . FLORASTOR 250 MG capsule Take 250 mg by mouth daily.   1  . hydrochlorothiazide (HYDRODIURIL) 12.5 MG tablet TAKE 1 TABLET BY MOUTH ONCE DAILY 90 tablet 1  . minoxidil (LONITEN) 2.5 MG tablet Take 1 tablet (2.5 mg total) by mouth daily. 30 tablet 3  . ondansetron (ZOFRAN) 4 MG tablet Take 1 tablet (4 mg total) by mouth daily as needed for nausea or vomiting. 30 tablet 1  . OVER THE COUNTER MEDICATION Nasocort daily as needed    . OVER THE COUNTER MEDICATION Sustain Ultra Eye drops    . pantoprazole (PROTONIX) 40 MG tablet Take 1 tab in the morning daily. 90 tablet 3  . predniSONE (DELTASONE) 20 MG tablet 2 tablets daily for 3 days, 1 tablet daily for 4 days. 10 tablet 0  . ZETIA 10 MG tablet take 1 tablet by mouth at bedtime 90 tablet 1   Current Facility-Administered Medications  Medication Dose Route Frequency Provider Last Rate Last Dose  . 0.9 %  sodium chloride infusion  500 mL Intravenous Continuous Jerene Bears, MD        PHYSICAL EXAMINATION: ECOG PERFORMANCE STATUS: 1 - Symptomatic but completely ambulatory  . Vitals:   09/08/16 1546  BP: (!) 147/52  Pulse: 66  Resp: 18  Temp: 98.2 F (36.8 C)    Filed Weights   09/08/16 1546  Weight: 207 lb 3.2 oz (94 kg)   .Body mass index is 37.29 kg/m.  GENERAL:alert, in no acute distress and comfortable SKIN: no acute rashes , healing nose surgical wound. EYES: normal, conjunctiva are pink and non-injected, sclera clear OROPHARYNX:no exudate, no erythema and lips, buccal mucosa, and tongue normal  NECK: supple, no JVD, thyroid normal size, non-tender, without nodularity LYMPH:  no palpable lymphadenopathy in the cervical, axillary or inguinal LUNGS: clear to auscultation with normal respiratory effort HEART: regular rate & rhythm,  no murmurs and no lower extremity edema ABDOMEN: abdomen soft, non-tender, normoactive bowel sounds , no palpable  hepatosplenomegaly Musculoskeletal: no cyanosis of digits and no clubbing  PSYCH: alert & oriented x 3 with fluent speech NEURO: no focal motor/sensory deficits  LABORATORY DATA:   I have reviewed the data as listed  Component     Latest Ref Rng & Units 09/08/2016  WBC     3.9 - 10.3 10e3/uL 114.9 (HH)  NEUT#     1.5 - 6.5 10e3/uL 7.4 (H)  Hemoglobin     11.6 - 15.9 g/dL 11.4 (L)  HCT     34.8 - 46.6 % 36.2  Platelets     145 - 400 10e3/uL 108 (L)  MCV     79.5 - 101.0 fL 96.8  MCH     25.1 - 34.0 pg 30.4  MCHC     31.5 - 36.0 g/dL 31.4 (L)  RBC     3.70 - 5.45 10e6/uL 3.74  RDW     11.2 - 14.5 % 14.7 (H)  lymph#     0.9 - 3.3 10e3/uL 103.9 (H)  MONO#     0.1 - 0.9 10e3/uL 2.8 (H)  Eosinophils Absolute     0.0 - 0.5 10e3/uL 0.6 (H)  Basophils Absolute     0.0 - 0.1 10e3/uL 0.3 (H)  NEUT%     38.4 - 76.8 % 6.4 (L)  LYMPH%     14.0 - 49.7 % 90.4 (H)  MONO%     0.0 - 14.0 % 2.4  EOS%     0.0 - 7.0 % 0.5  BASO%     0.0 - 2.0 % 0.3  Sodium     136 - 145 mEq/L 135 (L)  Potassium     3.5 - 5.1 mEq/L 3.5  Chloride     98 - 109 mEq/L 102  CO2     22 - 29 mEq/L 23  Glucose     70 - 140 mg/dl 112  BUN     7.0 - 26.0 mg/dL 17.1  Creatinine     0.6 - 1.1 mg/dL 0.9  Total Bilirubin     0.20 - 1.20 mg/dL 0.50  Alkaline Phosphatase     40 - 150 U/L 67  AST     5 - 34 U/L 32  ALT     0 - 55 U/L 26  Total Protein     6.4 - 8.3 g/dL 7.0  Albumin     3.5 - 5.0 g/dL 3.8  Calcium     8.4 - 10.4 mg/dL 9.6  Anion gap     3 - 11 mEq/L 10  EGFR     >90 ml/min/1.73 m2 68 (L)  LDH     125 - 245 U/L 209  Lactoferrin          RADIOGRAPHIC STUDIES: I have personally reviewed the radiological images as listed and agreed with the findings in the report.  CT ABDOMEN AND PELVIS WITHOUT CONTRAST (02/06/2015)  TECHNIQUE: Multidetector CT imaging of the abdomen and pelvis was performed following the standard protocol without IV contrast.  COMPARISON:  06/04/2014  FINDINGS: The lung bases are free of acute infiltrate or sizable effusion.  The gallbladder has been surgically removed. The liver again demonstrates a rounded hypodensity within the right lobe which is stable from the prior study. This is consistent with a small cysts. The spleen, adrenal glands and pancreas are within normal limits. The kidneys are well visualized bilaterally and reveal no obstructive change. A tiny stone is noted in the lower pole of the right kidney.  The appendix has been surgically removed. The uterus has been removed as well. The bladder is well distended. No pelvic mass lesion is seen. Stable lymph nodes are noted in the inguinal regions as well as iliac chains bilaterally. The overall appearance is similar to that seen on the prior exams dating back to 2014.  IMPRESSION: Postsurgical changes.  Stable hepatic cyst.  Tiny nonobstructing right renal stone.  Stable bilateral pelvic and inguinal lymph nodes dating back to 2014 consistent with a benign etiology.   Electronically Signed  By: Inez Catalina M.D.  On: 02/06/2015 10:54  ASSESSMENT & PLAN:   70 yo caucasian female with   1.Rai Stage 1 CLL with hypogammaglobulinemia: CLL initially diagnosed 2009 with 13q deletion.. Only intervention thus far has been intermittent IVIG, clinically very helpful with decrease  in respiratory infections.  Last imaging was CT AP 02-2015. --showed no evidence of Splenomegaly. Negative Hep B serology 2015   Lymphocyte count lower in clinic today likely from recent steroid use. No overt new constitutional symptoms except mild unchanged fatigue. LDH WNL  2. Mild Anemia Hgb not <11 3. Mild thrombocytopenia PLT 108K. 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 Has been taking excellent infection prevention precautions. 5. Recurrent cutaneous SCC upcoming Mohs surgery  for SCC of the nose s/p Mohs resection-   her 13q deletion places her at risk for recurrent SCC. PLAN -no overt indication for treatment of the patients CLL at this time. -we will switch the IVIG to q6weeks and premed for headaches--it still significant might need to consider holding off on additional IVIG -continue close f/u with dermatologist for evaluation and management of non melanoma skin cancers that can be increased in patient with CLL with 13q deletion.  6. Obesity BMI 37, deconditioning, poor exercise tolerance: 7.HTN, elevated cholesterol followed by Dr Melford Aase 8.obstructive sleep apnea for which she uses CPAP 9.GERD controlled  Reschedule IVIG infusion from 09/16/2016 to 09/30/2016 and then the next one 6 weeks later (11/11/2016 instead of 10/14/2016) RTC with Dr Irene Limbo on 11/11/2016 prior to IVIG Continue f/u with PCP  I spent 20 minutes counseling the patient face to face. The total time spent in the appointment was 25 minutes and more than 50% was on counseling and direct patient cares.    Sullivan Lone MD [place] AAHIVMS [facility] Hematology/Oncology Physician [facility]  (Office):       [phone] (Work cell):  [phone] (Fax):           [phone]

## 2016-12-04 NOTE — Progress Notes (Signed)
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 (patient reports -planned for August 2017) 13q deletion dose pre-dispose her to recurrent SCC  Current Treatment: observation  Previous treatment: IVIG for several months last winter to reduce recurrent respiratory infections (Patient notes that this helped) Has no required definitive treatment for CLL at this time  INTERVAL HISTORY:  Patient is here for early than planned f/u for CLL. She has headaches with meningismus like symptoms again requiring IVG, prednisone, toradol. No FND. We discussed this and decided to hold off on additional IVIG at this time.  Developing some worsening anemia hgb 10.7 and thrombocytopenia 84k. Discussed that she might need CLL treatment if counts worsen.  REVIEW OF SYSTEMS:    10 Point review of systems of done and is negative except as noted above.  . Past Medical History:  Diagnosis Date  . Anxiety   . Benign essential HTN 11/22/2011  . CLL (chronic lymphocytic leukemia) ([place])   . Depression   . GERD (gastroesophageal reflux disease)   . 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 High cholesterol   . History of bronchitis 08/02/2012   "maybe once/yr if that much; last time 01/2010"  . 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 OSA (obstructive sleep apnea)   . Pneumonia 01/2010  . Prediabetes   . Recurrent sinus infections 08/02/2012    . Past Surgical History:  Procedure Laterality Date  . ABDOMINAL HYSTERECTOMY  1980's   "endometrosis"  . BREAST SURGERY    . CHOLECYSTECTOMY  1985  . FUNCTIONAL ENDOSCOPIC SINUS SURGERY  1990's   "cause I kept having sinus infections"  . immunoglobulin treatment  2017  .  LAPAROSCOPIC APPENDECTOMY N/A 06/04/2014   Procedure: APPENDECTOMY LAPAROSCOPIC;  Surgeon: Zenovia Jarred, MD;  Location: [facility];  Service: General;  Laterality: N/A;  . LYMPH NODE BIOPSY     "determined I had CLL"  . TEMPOROMANDIBULAR JOINT ARTHROPLASTY  1980's  . TONSILLECTOMY AND ADENOIDECTOMY  1950's    . Social History  Substance Use Topics  . Smoking status: Former Smoker    Packs/day: 0.75    Years: 4.00    Types: Cigarettes    Quit date: 12/05/1968  . Smokeless tobacco: Never Used  . Alcohol use 8.4 oz/week    14 Glasses of wine per week     Comment: 08/02/2012 "couple glasses wine q hs; last time 2-3 wk ago"    ALLERGIES:  is allergic to hyzaar [losartan potassium-hctz]; sudafed [pseudoephedrine hcl]; biaxin [clarithromycin]; celexa [citalopram]; flexeril [cyclobenzaprine]; fosamax [alendronate sodium]; iohexol; losartan; meloxicam; norvasc [amlodipine]; pseudoephedrine; and zoloft [sertraline hcl].  MEDICATIONS:  Current Outpatient Prescriptions  Medication Sig Dispense Refill  . ALPRAZolam (XANAX) 1 MG tablet TAKE 1/2 TO 1 TABLET 3 TIMES A DAY AS NEEDED FOR ANXIETY 90 tablet 0  . Ascorbic Acid (VITAMIN C) 1000 MG tablet Take 1,000 mg by mouth daily.    [name] Kitchen azithromycin (ZITHROMAX) 250 MG tablet Take 2 tablets (500 mg) on  Day 1,  followed by 1 tablet (250 mg) once daily on Days 2 through 5. 6 each 1  . bisoprolol-hydrochlorothiazide (ZIAC) 10-6.25 MG tablet take 1 tablet by mouth once daily 90 tablet 1  .  buPROPion (WELLBUTRIN XL) 150 MG 24 hr tablet take 1 tablet by mouth once daily 90 tablet 1  . Cholecalciferol (VITAMIN D PO) Take 10,000 Units by mouth daily.     [name] Kitchen dicyclomine (BENTYL) 10 MG capsule Take 1 tab 3 times daily as needed for abdominal pain and cramping. 90 capsule 1  . escitalopram (LEXAPRO) 20 MG tablet Take 1 tablet (20 mg total) by mouth daily. 30 tablet 2  . fexofenadine (ALLEGRA) 180 MG tablet Take 1 tablet (180 mg total) by mouth daily. Take PRN for  allergies. 30 tablet 6  . FLORASTOR 250 MG capsule Take 250 mg by mouth daily.   1  . hydrochlorothiazide (HYDRODIURIL) 12.5 MG tablet TAKE 1 TABLET BY MOUTH ONCE DAILY 90 tablet 1  . minoxidil (LONITEN) 2.5 MG tablet Take 1 tablet (2.5 mg total) by mouth daily. 30 tablet 3  . ondansetron (ZOFRAN) 4 MG tablet Take 1 tablet (4 mg total) by mouth daily as needed for nausea or vomiting. 30 tablet 1  . OVER THE COUNTER MEDICATION Nasocort daily as needed    . OVER THE COUNTER MEDICATION Sustain Ultra Eye drops    . pantoprazole (PROTONIX) 40 MG tablet Take 1 tab in the morning daily. 90 tablet 3  . predniSONE (DELTASONE) 20 MG tablet 2 tablets daily for 3 days, 1 tablet daily for 4 days. 10 tablet 0  . ZETIA 10 MG tablet take 1 tablet by mouth at bedtime 90 tablet 1   Current Facility-Administered Medications  Medication Dose Route Frequency Provider Last Rate Last Dose  . 0.9 %  sodium chloride infusion  500 mL Intravenous Continuous Jerene Bears, MD        PHYSICAL EXAMINATION: ECOG PERFORMANCE STATUS: 1 - Symptomatic but completely ambulatory  . Vitals:   10/20/16 1502  BP: (!) 171/72  Pulse: 79  Resp: 18  Temp: 98.1 F (36.7 C)    Filed Weights   10/20/16 1502  Weight: 213 lb 11.2 oz (96.9 kg)   .Body mass index is 38.46 kg/m.  GENERAL:alert, in no acute distress and comfortable SKIN: no acute rashes , healing nose surgical wound. EYES: normal, conjunctiva are pink and non-injected, sclera clear OROPHARYNX:no exudate, no erythema and lips, buccal mucosa, and tongue normal  NECK: supple, no JVD, thyroid normal size, non-tender, without nodularity LYMPH:  no palpable lymphadenopathy in the cervical, axillary or inguinal LUNGS: clear to auscultation with normal respiratory effort HEART: regular rate & rhythm,  no murmurs and no lower extremity edema ABDOMEN: abdomen soft, non-tender, normoactive bowel sounds , no palpable hepatosplenomegaly Musculoskeletal: no cyanosis of  digits and no clubbing  PSYCH: alert & oriented x 3 with fluent speech NEURO: no focal motor/sensory deficits  LABORATORY DATA:   I have reviewed the data as listed Component     Latest Ref Rng & Units 10/20/2016  WBC     3.9 - 10.3 10e3/uL 102.2 (HH)  NEUT#     1.5 - 6.5 10e3/uL 5.6  Hemoglobin     11.6 - 15.9 g/dL 10.7 (L)  HCT     34.8 - 46.6 % 33.7 (L)  Platelets     145 - 400 10e3/uL 84 (L)  MCV     79.5 - 101.0 fL 96.0  MCH     25.1 - 34.0 pg 30.5  MCHC     31.5 - 36.0 g/dL 31.8  RBC     3.70 - 5.45 10e6/uL 3.51 (L)  RDW  11.2 - 14.5 % 14.7 (H)  lymph#     0.9 - 3.3 10e3/uL 94.8 (H)  MONO#     0.1 - 0.9 10e3/uL 1.3 (H)  Eosinophils Absolute     0.0 - 0.5 10e3/uL 0.2  Basophils Absolute     0.0 - 0.1 10e3/uL 0.3 (H)  NEUT%     38.4 - 76.8 % 5.4 (L)  LYMPH%     14.0 - 49.7 % 92.8 (H)  MONO%     0.0 - 14.0 % 1.3  EOS%     0.0 - 7.0 % 0.2  BASO%     0.0 - 2.0 % 0.3  Retic %     0.70 - 2.10 % 1.88  Retic Ct Abs     33.70 - 90.70 10e3/uL 65.99  Immature Retic Fract     1.60 - 10.00 % 18.80 (H)  Sodium     136 - 145 mEq/L 136  Potassium     3.5 - 5.1 mEq/L 3.7  Chloride     98 - 109 mEq/L 103  CO2     22 - 29 mEq/L 24  Glucose     70 - 140 mg/dl 108  BUN     7.0 - 26.0 mg/dL 15.4  Creatinine     0.6 - 1.1 mg/dL 0.8  Total Bilirubin     0.20 - 1.20 mg/dL 0.49  Alkaline Phosphatase     40 - 150 U/L 68  AST     5 - 34 U/L 27  ALT     0 - 55 U/L 25  Total Protein     6.4 - 8.3 g/dL 6.8  Albumin     3.5 - 5.0 g/dL 3.6  Calcium     8.4 - 10.4 mg/dL 9.5  Anion gap     3 - 11 mEq/L 10  EGFR     >90 ml/min/1.73 m2 76 (L)  LDH     125 - 245 U/L 226     RADIOGRAPHIC STUDIES: I have personally reviewed the radiological images as listed and agreed with the findings in the report.  CT ABDOMEN AND PELVIS WITHOUT CONTRAST (02/06/2015)  TECHNIQUE: Multidetector CT imaging of the abdomen and pelvis was performed following the standard  protocol without IV contrast.  COMPARISON: 06/04/2014  FINDINGS: The lung bases are free of acute infiltrate or sizable effusion.  The gallbladder has been surgically removed. The liver again demonstrates a rounded hypodensity within the right lobe which is stable from the prior study. This is consistent with a small cysts. The spleen, adrenal glands and pancreas are within normal limits. The kidneys are well visualized bilaterally and reveal no obstructive change. A tiny stone is noted in the lower pole of the right kidney.  The appendix has been surgically removed. The uterus has been removed as well. The bladder is well distended. No pelvic mass lesion is seen. Stable lymph nodes are noted in the inguinal regions as well as iliac chains bilaterally. The overall appearance is similar to that seen on the prior exams dating back to 2014.  IMPRESSION: Postsurgical changes.  Stable hepatic cyst.  Tiny nonobstructing right renal stone.  Stable bilateral pelvic and inguinal lymph nodes dating back to 2014 consistent with a benign etiology.   Electronically Signed  By: Inez Catalina M.D.  On: 02/06/2015 10:54  ASSESSMENT & PLAN:   70 yo caucasian female with   1.Rai Stage 1 CLL with hypogammaglobulinemia: CLL initially diagnosed 2009 with 13q  deletion.. Only intervention thus far has been intermittent IVIG, clinically very helpful with decrease in respiratory infections.  Last imaging was CT AP 02-2015. --showed no evidence of Splenomegaly. Negative Hep B serology 2015   Lymphocyte count lower in clinic today likely from recent steroid use. No overt new constitutional symptoms except mild unchanged fatigue. LDH WNL  2. Mild Anemia Hgb now <11 3. Moderate thrombocytopenia PLT  Down to 84k from108K.  4. 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 Has been taking excellent infection prevention precautions. Has had significant recurrent headaches with IVIG - now  held. 5. Recurrent cutaneous SCC upcoming Mohs surgery for SCC of the nose s/p Mohs resection - healing well. No issues with infection-   her 13q deletion places her at risk for recurrent SCC. PLAN -we discussed that her Hgb and Plt have dropped and that she might need treatment if her cytopenias worsen -hold IVIG  Due to significant meningimus like headaches. -continue close f/u with dermatologist for evaluation and management of non melanoma skin cancers that can be increased in patient with CLL with 13q deletion.  6. Obesity BMI 37, deconditioning, poor exercise tolerance: 7.HTN, elevated cholesterol followed by Dr Melford Aase 8.obstructive sleep apnea for which she uses CPAP 9.GERD controlled  Plz discontinue IVIG infusion appointment from 11/11/2016 and any others -labs in 4 weeks -RTC with Dr Irene Limbo in 2 months with labs  I spent 20 minutes counseling the patient face to face. The total time spent in the appointment was 25 minutes and more than 50% was on counseling and direct patient cares.    Sullivan Lone MD [place] AAHIVMS [facility] Hematology/Oncology Physician [facility]  (Office):       [phone] (Work cell):  [phone] (Fax):           [phone]

## 2016-12-06 LAB — GASTROINTESTINAL PATHOGEN PANEL PCR
C. DIFFICILE TOX A/B, PCR: NOT DETECTED
Campylobacter, PCR: NOT DETECTED
Cryptosporidium, PCR: NOT DETECTED
E COLI (ETEC) LT/ST, PCR: NOT DETECTED
E COLI (STEC) STX1/STX2, PCR: NOT DETECTED
E coli 0157, PCR: NOT DETECTED
Giardia lamblia, PCR: NOT DETECTED
NOROVIRUS, PCR: NOT DETECTED
ROTAVIRUS, PCR: NOT DETECTED
SALMONELLA, PCR: NOT DETECTED
Shigella, PCR: NOT DETECTED

## 2016-12-08 LAB — PANCREATIC ELASTASE, FECAL: Pancreatic Elastase-1, Stool: >500 mcg/g

## 2016-12-09 ENCOUNTER — Telehealth: Payer: Self-pay | Admitting: Internal Medicine

## 2016-12-09 NOTE — Telephone Encounter (Signed)
Spoke with pt, see result note.  

## 2016-12-13 ENCOUNTER — Ambulatory Visit (INDEPENDENT_AMBULATORY_CARE_PROVIDER_SITE_OTHER): Payer: 59 | Admitting: Physician Assistant

## 2016-12-13 ENCOUNTER — Encounter: Payer: Self-pay | Admitting: Physician Assistant

## 2016-12-13 VITALS — BP 132/90 | HR 86 | Temp 97.5°F | Resp 16 | Ht 62.5 in | Wt 215.4 lb

## 2016-12-13 DIAGNOSIS — R197 Diarrhea, unspecified: Secondary | ICD-10-CM | POA: Diagnosis not present

## 2016-12-13 DIAGNOSIS — R232 Flushing: Secondary | ICD-10-CM | POA: Diagnosis not present

## 2016-12-13 DIAGNOSIS — C9110 Chronic lymphocytic leukemia of B-cell type not having achieved remission: Secondary | ICD-10-CM | POA: Diagnosis not present

## 2016-12-13 DIAGNOSIS — F411 Generalized anxiety disorder: Secondary | ICD-10-CM | POA: Diagnosis not present

## 2016-12-13 DIAGNOSIS — R1011 Right upper quadrant pain: Secondary | ICD-10-CM | POA: Diagnosis not present

## 2016-12-13 MED ORDER — ELUXADOLINE 75 MG PO TABS: 75.0000 mg | ORAL_TABLET | Freq: Two times a day (BID) | ORAL | 3 refills | Status: DC

## 2016-12-13 NOTE — Patient Instructions (Addendum)
Can get counseling at your church Check for group counseling at cancer center for support/CLL support  Stop the zetia x 2 weeks to see if this helps your bowels.   We are going to check some labs Stay on the lexapro  If 2-4 weeks try the viberzi for diarrhea depending on labs work, how the lexapro is, and if stopping zetia did anything  How do I do a 24-hour urine collection?  When you get up in the morning, urinate in the toilet and flush. Write down the time. This will be your start time on the day of collection and your end time on the next morning.  From then on, collect all of your urine in the plastic jug that is given to you.  Stop collecting your urine 24 hours after you started.  If the plastic jug that is given to you already has liquid in it, that is okay. Do not throw out the liquid or rinse out the jug. Some tests need the liquid to be added to your urine.  Keep your plastic jug cool in an ice chest or keep it in the refrigerator during the test.  When 24 hours are over, bring your plastic jug to the clinic lab. Keep the jug cool in an ice chest while you are bringing it to the lab. This information is not intended to replace advice given to you by your health care provider. Make sure you discuss any questions you have with your health care provider. Document Released: 02/17/2009 Document Revised: 07/25/2016 Document Reviewed: 04/16/2014 Elsevier Interactive Patient Education  2017 [place].   Diet for Irritable Bowel Syndrome Introduction When you have irritable bowel syndrome (IBS), the foods you eat and your eating habits are very important. IBS may cause various symptoms, such as abdominal pain, constipation, or diarrhea. Choosing the right foods can help ease discomfort caused by these symptoms. Work with your health care provider and dietitian to find the best eating plan to help control your symptoms. What general guidelines do I need to follow?  Keep a food  diary. This will help you identify foods that cause symptoms. Write down:  What you eat and when.  What symptoms you have.  When symptoms occur in relation to your meals.  Avoid foods that cause symptoms. Talk with your dietitian about other ways to get the same nutrients that are in these foods.  Eat more foods that contain fiber. Take a fiber supplement if directed by your dietitian.  Eat your meals slowly, in a relaxed setting.  Aim to eat 5-6 small meals per day. Do not skip meals.  Drink enough fluids to keep your urine clear or pale yellow.  Ask your health care provider if you should take an over-the-counter probiotic during flare-ups to help restore healthy gut bacteria.  If you have cramping or diarrhea, try making your meals low in fat and high in carbohydrates. Examples of carbohydrates are pasta, rice, whole grain breads and cereals, fruits, and vegetables.  If dairy products cause your symptoms to flare up, try eating less of them. You might be able to handle yogurt better than other dairy products because it contains bacteria that help with digestion. What foods are not recommended? The following are some foods and drinks that may worsen your symptoms:  Fatty foods, such as [place] fries.  Milk products, such as cheese or ice cream.  Chocolate.  Alcohol.  Products with caffeine, such as coffee.  Carbonated drinks, such as soda.  The items listed above may not be a complete list of foods and beverages to avoid. Contact your dietitian for more information.  What foods are good sources of fiber? Your health care provider or dietitian may recommend that you eat more foods that contain fiber. Fiber can help reduce constipation and other IBS symptoms. Add foods with fiber to your diet a little at a time so that your body can get used to them. Too much fiber at once might cause gas and swelling of your abdomen. The following are some foods that are good sources of  fiber:  Apples.  Peaches.  Pears.  Berries.  Figs.  Broccoli (raw).  Cabbage.  Carrots.  Raw peas.  Kidney beans.  Lima beans.  Whole grain bread.  Whole grain cereal. Where to find more information: [employer] for Functional Gastrointestinal Disorders: www.iffgd.[employer] of Diabetes and Digestive and Kidney Diseases: NetworkAffair.co.za.aspx This information is not intended to replace advice given to you by your health care provider. Make sure you discuss any questions you have with your health care provider. Document Released: 02/11/2004 Document Revised: 04/28/2016 Document Reviewed: 02/21/2014  2017 Elsevier

## 2016-12-13 NOTE — Progress Notes (Addendum)
Assessment and Plan:  CLL (chronic lymphocytic leukemia) ([place])  Has follow up next week to discuss possible chemo  Suggest finding support group for CLL  Generalized anxiety disorder Continue lexapro, follow up 4 weeks, continue counseling at church  Diarrhea, unspecified type -    Stop the zetia, check the labs, diet discussed, , if that does not help, may try welcol  Flushing -     5 HIAA, quantitative, urine, 24 hour; Future -     Catecholamines, fractionated, urine, 24 hour; Future   DUE TO PHYSICAL AND EMOTIONAL FATIGUE FROM CLL, DIARRHEA AND ANXIETY PATIENT IS TAKING SHORT TERM DISABILITY WHILE BEING EVALUATED AND WORKING ON COUNSELING. Close follow up here.   HPI 71 y.o.female with history of CLL doing chemo, fatigue, AB pain, anxiety presents for 1 month follow up.   Due to CLL fatigue being treated by Dr. Irene Limbo and may start chemo, will discuss next OV, AB pain that is still being evaluated by GI, she has had right sided pain across back/chest with diarrhea, she has had normal CT pelvis, normal colonoscopy and EGD with normal BX. Negative h pylori, negative stool test, negative fecal fat. She continues to have diarrhea, will have flushing and elevated BP at times too.   Father is in [place], had accident with snow and has to have surgery on his hip. She feels that physically she can not go up there and is feeling stressed not being able to be there of him. She has started lexapro x 1 week 1/2 a pill, don't okay at this time, she is going to start counseling at her church with certified counselor there and she is also going to check about support groups for CLL at the cancer center.   She continues to have diarrhea, she has had diarrhea the last 3-5 days, 2 times she did not make it to the bathroom on time. She does have lactulose, she avoids greasy foods. She has been on zetia for a long time.   Past Medical History:  Diagnosis Date  . Anxiety   . Benign essential HTN 11/22/2011   . CLL (chronic lymphocytic leukemia) ([place])   . Depression   . GERD (gastroesophageal reflux disease)   . 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 High cholesterol   . History of bronchitis 08/02/2012   "maybe once/yr if that much; last time 01/2010"  . 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 OSA (obstructive sleep apnea)   . Pneumonia 01/2010  . Prediabetes   . Recurrent sinus infections 08/02/2012     Allergies  Allergen Reactions  . Hyzaar [Losartan Potassium-Hctz] Other (See Comments)    "messed up my sodium counts" swells up lips.  [name] [Pseudoephedrine Hcl] Palpitations  . Biaxin [Clarithromycin]     GI Upset  . Celexa [Citalopram]   . Flexeril [Cyclobenzaprine]     "zombie-like" feeling  . Fosamax [Alendronate Sodium]     GI upset  . Iohexol Hives     Code: HIVES, Desc: pt states she broke out in hive 20 yrs ago from IV contrast.     . Losartan     Angioedema  . Meloxicam     GI upset  . Norvasc [Amlodipine] Swelling  . Pseudoephedrine     Palpitations  . Zoloft [Sertraline Hcl]     Current Outpatient Prescriptions on File Prior to Visit  Medication Sig  . ALPRAZolam (XANAX) 1 MG tablet TAKE 1/2 TO 1 TABLET 3 TIMES A  DAY AS NEEDED FOR ANXIETY  . Ascorbic Acid (VITAMIN C) 1000 MG tablet Take 1,000 mg by mouth daily.  . bisoprolol-hydrochlorothiazide (ZIAC) 10-6.25 MG tablet take 1 tablet by mouth once daily  . Cholecalciferol (VITAMIN D PO) Take 10,000 Units by mouth daily.   [name] Kitchen dicyclomine (BENTYL) 10 MG capsule Take 1 tab 3 times daily as needed for abdominal pain and cramping.  . escitalopram (LEXAPRO) 20 MG tablet Take 1 tablet (20 mg total) by mouth daily.  . fexofenadine (ALLEGRA) 180 MG tablet Take 1 tablet (180 mg total) by mouth daily. Take PRN for allergies.  [name] Kitchen FLORASTOR 250 MG capsule Take 250 mg by mouth daily.   . minoxidil (LONITEN) 2.5 MG tablet Take 1 tablet (2.5 mg total) by mouth daily.  . ondansetron (ZOFRAN) 4 MG  tablet Take 1 tablet (4 mg total) by mouth daily as needed for nausea or vomiting.  [name] Kitchen OVER THE COUNTER MEDICATION Nasocort daily as needed  . OVER THE COUNTER MEDICATION Sustain Ultra Eye drops  . pantoprazole (PROTONIX) 40 MG tablet Take 1 tab in the morning daily.  [name] Kitchen ZETIA 10 MG tablet take 1 tablet by mouth at bedtime   Current Facility-Administered Medications on File Prior to Visit  Medication  . 0.9 %  sodium chloride infusion    ROS: all negative except above.   Physical Exam: Filed Weights   12/13/16 1625  Weight: 215 lb 6.4 oz (97.7 kg)   BP 132/90   Pulse 86   Temp 97.5 F (36.4 C)   Resp 16   Ht 5' 2.5" (1.588 m)   Wt 215 lb 6.4 oz (97.7 kg)   SpO2 97%   BMI 38.77 kg/m  General Appearance: Well nourished, in no apparent distress, no diaphroesis Eyes: PERRLA, EOMs, conjunctiva no swelling or erythema Sinuses: + Frontal/maxillary tenderness ENT/Mouth: Ext aud canals clear, TMs without erythema, bulging. No erythema, swelling, or exudate on post pharynx.  Tonsils not swollen or erythematous. Hearing normal. + TMJ tenderness Neck: Supple, thyroid normal.  Respiratory: Respiratory effort normal, BS equal bilaterally without rales, rhonchi, wheezing or stridor.  Cardio: RRR with no MRGs. Brisk peripheral pulses without edema.  Abdomen: Soft, + BS, obese, diffusely overly tender to palpation, no peritoneal signs, + guarding, without rebound, hernias, masses. Lymphatics: Non tender without lymphadenopathy.  Musculoskeletal: Full ROM, 5/5 strength, Normal gait,+ chest wall tenderness, Left crepitus with ROM, sensory exam normal, motor exam normal, radial pulse intact and no deformity, + tenderness over subacromial bursa and biceps tendon, pain with abduction to 90 degree  Strength is normal and symmetric in arms. Skin:  Warm, dry without rashes, lesions, ecchymosis.  Neuro: Cranial nerves intact. Normal muscle tone, no cerebellar symptoms. Psych: Awake and oriented X 3,  normal affect, appears anxious, Insight and Judgment appropriate.     Vicie Mutters, PA-C 4:57 PM [facility]

## 2016-12-15 ENCOUNTER — Telehealth: Payer: Self-pay | Admitting: Hematology

## 2016-12-15 ENCOUNTER — Encounter: Payer: Self-pay | Admitting: Hematology

## 2016-12-15 ENCOUNTER — Other Ambulatory Visit (HOSPITAL_BASED_OUTPATIENT_CLINIC_OR_DEPARTMENT_OTHER): Payer: 59

## 2016-12-15 ENCOUNTER — Other Ambulatory Visit: Payer: Self-pay | Admitting: *Deleted

## 2016-12-15 ENCOUNTER — Ambulatory Visit (HOSPITAL_BASED_OUTPATIENT_CLINIC_OR_DEPARTMENT_OTHER): Payer: 59 | Admitting: Hematology

## 2016-12-15 VITALS — BP 165/60 | HR 73 | Temp 98.1°F | Resp 18 | Ht 62.5 in | Wt 213.6 lb

## 2016-12-15 DIAGNOSIS — D696 Thrombocytopenia, unspecified: Secondary | ICD-10-CM

## 2016-12-15 DIAGNOSIS — D649 Anemia, unspecified: Secondary | ICD-10-CM | POA: Diagnosis not present

## 2016-12-15 DIAGNOSIS — C9110 Chronic lymphocytic leukemia of B-cell type not having achieved remission: Secondary | ICD-10-CM | POA: Diagnosis not present

## 2016-12-15 DIAGNOSIS — R197 Diarrhea, unspecified: Secondary | ICD-10-CM

## 2016-12-15 DIAGNOSIS — D801 Nonfamilial hypogammaglobulinemia: Secondary | ICD-10-CM

## 2016-12-15 LAB — COMPREHENSIVE METABOLIC PANEL
ALK PHOS: 76 U/L (ref 40–150)
ALT: 21 U/L (ref 0–55)
AST: 22 U/L (ref 5–34)
Albumin: 3.9 g/dL (ref 3.5–5.0)
Anion Gap: 8 mEq/L (ref 3–11)
BILIRUBIN TOTAL: 0.72 mg/dL (ref 0.20–1.20)
BUN: 14.4 mg/dL (ref 7.0–26.0)
CO2: 24 meq/L (ref 22–29)
Calcium: 9.3 mg/dL (ref 8.4–10.4)
Chloride: 101 mEq/L (ref 98–109)
Creatinine: 0.8 mg/dL (ref 0.6–1.1)
EGFR: 72 mL/min/{1.73_m2} — AB (ref >90)
GLUCOSE: 96 mg/dL (ref 70–140)
POTASSIUM: 4.2 meq/L (ref 3.5–5.1)
SODIUM: 133 meq/L — AB (ref 136–145)
Total Protein: 6.6 g/dL (ref 6.4–8.3)

## 2016-12-15 LAB — CBC & DIFF AND RETIC
BASO%: 0.3 % (ref 0.0–2.0)
Basophils Absolute: 0.4 10*3/uL — ABNORMAL HIGH (ref 0.0–0.1)
EOS ABS: 0.3 10*3/uL (ref 0.0–0.5)
EOS%: 0.2 % (ref 0.0–7.0)
HCT: 34.1 % — ABNORMAL LOW (ref 34.8–46.6)
HGB: 10.5 g/dL — ABNORMAL LOW (ref 11.6–15.9)
IMMATURE RETIC FRACT: 23.1 % — AB (ref 1.60–10.00)
LYMPH%: 92.7 % — ABNORMAL HIGH (ref 14.0–49.7)
MCH: 29.9 pg (ref 25.1–34.0)
MCHC: 30.8 g/dL — AB (ref 31.5–36.0)
MCV: 97.2 fL (ref 79.5–101.0)
MONO#: 1.8 10*3/uL — ABNORMAL HIGH (ref 0.1–0.9)
MONO%: 1.3 % (ref 0.0–14.0)
NEUT#: 7.5 10*3/uL — ABNORMAL HIGH (ref 1.5–6.5)
NEUT%: 5.5 % — ABNORMAL LOW (ref 38.4–76.8)
NRBC: 0 % (ref 0–0)
Platelets: 117 10*3/uL — ABNORMAL LOW (ref 145–400)
RBC: 3.51 10*6/uL — AB (ref 3.70–5.45)
RDW: 15.1 % — AB (ref 11.2–14.5)
Retic %: 2.56 % — ABNORMAL HIGH (ref 0.70–2.10)
Retic Ct Abs: 89.86 10*3/uL (ref 33.70–90.70)
WBC: 136.3 10*3/uL — AB (ref 3.9–10.3)
lymph#: 126.4 10*3/uL — ABNORMAL HIGH (ref 0.9–3.3)

## 2016-12-15 LAB — LACTATE DEHYDROGENASE: LDH: 219 U/L (ref 125–245)

## 2016-12-15 NOTE — Addendum Note (Signed)
Addended by: Dolores Hoose on: 12/15/2016 10:03 AM   Modules accepted: Orders

## 2016-12-15 NOTE — Telephone Encounter (Signed)
Gave patient avs report and appointments for February.  °

## 2016-12-19 LAB — CATECHOLAMINES, FRACTIONATED, URINE, 24 HOUR
Calculated Total (E+NE): 43 mcg/24 h (ref 26–121)
Creatinine, Urine mg/day-CATEUR: 0.88 g/(24.h) (ref 0.63–2.50)
Dopamine, 24 hr Urine: 170 mcg/24 h (ref 52–480)
Epinephrine, 24 hr Urine: 4 mcg/24 h (ref 2–24)
Norepinephrine, 24 hr Ur: 39 mcg/24 h (ref 15–100)
Total Volume - CF 24Hr U: 2000 mL

## 2016-12-19 LAB — 5 HIAA, QUANTITATIVE, URINE, 24 HOUR: 5-HIAA, URINE: 3.8 mg/(24.h) (ref <=6.0)

## 2016-12-20 ENCOUNTER — Telehealth: Payer: Self-pay | Admitting: Physician Assistant

## 2016-12-20 MED ORDER — COLESEVELAM HCL 3.75 G PO PACK: PACK | ORAL | 0 refills | Status: DC

## 2016-12-20 MED ORDER — CHOLESTYRAMINE 4 G PO PACK: PACK | ORAL | 11 refills | Status: DC

## 2016-12-20 NOTE — Telephone Encounter (Signed)
-----   Message from Elenor Quinones, [place] sent at 12/20/2016 10:31 AM EST ----- Cholestyramine-$159.00  Pt states the cost of cholestyramine is too much & would like something different called into her pharmacy.

## 2016-12-20 NOTE — Progress Notes (Signed)
Informed pt of instructions  Pt states she continues to have diarrhea. Pt stated that she did some research on VIBERZI & it says if you do not have a gallbladder then you should not be taking this med, pt said she has no gallbladder.  No samples were left up front. Please advise.

## 2016-12-20 NOTE — Addendum Note (Signed)
Addended by: Vicie Mutters R on: 12/20/2016 09:16 AM   Modules accepted: Orders

## 2016-12-29 ENCOUNTER — Encounter (INDEPENDENT_AMBULATORY_CARE_PROVIDER_SITE_OTHER): Payer: Self-pay

## 2016-12-29 ENCOUNTER — Ambulatory Visit (INDEPENDENT_AMBULATORY_CARE_PROVIDER_SITE_OTHER): Payer: 59 | Admitting: Physician Assistant

## 2016-12-29 ENCOUNTER — Encounter: Payer: Self-pay | Admitting: Physician Assistant

## 2016-12-29 VITALS — BP 168/78 | HR 64 | Ht 62.5 in | Wt 208.1 lb

## 2016-12-29 DIAGNOSIS — R197 Diarrhea, unspecified: Secondary | ICD-10-CM | POA: Diagnosis not present

## 2016-12-29 DIAGNOSIS — K580 Irritable bowel syndrome with diarrhea: Secondary | ICD-10-CM

## 2016-12-29 MED ORDER — DICYCLOMINE HCL 10 MG PO CAPS: ORAL_CAPSULE | ORAL | 6 refills | Status: DC

## 2016-12-29 MED ORDER — RIFAXIMIN 550 MG PO TABS: 550.0000 mg | ORAL_TABLET | Freq: Three times a day (TID) | ORAL | 0 refills | Status: AC

## 2016-12-29 NOTE — Patient Instructions (Addendum)
Continue Dicyclomine 10 mg, one tablet 3 times daily. We sent a prescription for Xifaxan 550 mg to [facility] .  They will call you after they run your insurance to tell you what you cost will be.    Start Imodium, take 1 tab in the morning.l Continue Align daily   Call with a progress report after you finish the Xifaxan medication.

## 2016-12-29 NOTE — Progress Notes (Addendum)
Subjective:    Patient ID: Rebecca Bond, female    DOB: 1945/12/22, 71 y.o.   MRN: [id number]  HPI [name] is a pleasant 71 year old white female, known to Dr. Hilarie Fredrickson. She has been undergoing workup for right-sided abdominal pain and persistent diarrhea. 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 She was initially seen in early December 2017 with complaints of right-sided abdominal discomfort and 45 week history of diarrhea. She underwent CT of the abdomen and pelvis which showed mild splenomegaly and prominent inguinal and external iliac lymph nodes. She had nonobstructing stone in the right kidney. She was given a trial of dicyclomine and then scheduled for EGD and colonoscopy. EGD on 11/21/2016 per Dr. Hilarie Fredrickson showed a normal exam and gastric and small bowel biopsies were both negative. Colonoscopy that same day with removal of 3 polyps which were all tubular adenomas. Random biopsies of the colon were negative for microscopic colitis. Since that time she's also completed a GI pathogen panel stool for lactoferrin fecal elastase and 24-hour urine for 5-HIAA all of these studies were negative. She was recently started on Questran by her PCP with 1 dose daily over the past week which has not made any difference in her symptoms. She was seen by Dr. Irene Limbo her oncologist 2 weeks ago he returned her that it was possible that her diarrhea was being caused by CLL though this was rare. Most recent CBC showed a WBC of 136.3 hemoglobin of 10.5 and platelets 117. She says the diarrhea has improved since November that she has been out of work over the past several weeks on short-term disability as she says she can't function at work in the setting of urgency and intermittent fecal incontinence. She is usually having 3-5 bowel movements per day, loose nonbloody and at times very urgent. Continues to have  abdominal cramping and discomfort which she feels is worse at night, she does feel the Bentyl was helpful. She had been on Zetia which has been stopped with no change in diarrhea. Patient does discuss generalized anxiety, her father who lives in another state has been very ill, she has been out of work and hasn't had a pay check over the past month or so, all of these things contributing to underlying anxiety. She has a prescription for Xanax but has not been using this regularly.  Review of Systems Pertinent positive and negative review of systems were noted in the above HPI section.  All other review of systems was otherwise negative.  Outpatient Encounter Prescriptions as of 12/29/2016  Medication Sig  . ALPRAZolam (XANAX) 1 MG tablet TAKE 1/2 TO 1 TABLET 3 TIMES A DAY AS NEEDED FOR ANXIETY  . bisoprolol-hydrochlorothiazide (ZIAC) 10-6.25 MG tablet take 1 tablet by mouth once daily  . Colesevelam HCl (WELCHOL) 3.75 g PACK One packet daily with full glass of water for diarrhea  . dicyclomine (BENTYL) 10 MG capsule Take 1 tab 3 times daily as needed for abdominal pain and cramping.  . escitalopram (LEXAPRO) 20 MG tablet Take 1 tablet (20 mg total) by mouth daily. (Patient taking differently: Take 10 mg by mouth daily. )  . fexofenadine (ALLEGRA) 180 MG tablet Take 1 tablet (180 mg total) by mouth daily. Take PRN for allergies.  [name] Kitchen FLORASTOR 250 MG capsule Take 250 mg by mouth daily.   . minoxidil (LONITEN) 2.5 MG tablet Take 1 tablet (2.5 mg total) by mouth daily. (Patient  taking differently: Take 2.5 mg by mouth daily. HASNT STARTED YET)  . ondansetron (ZOFRAN) 4 MG tablet Take 1 tablet (4 mg total) by mouth daily as needed for nausea or vomiting.  [name] Kitchen OVER THE COUNTER MEDICATION Nasocort daily as needed  . OVER THE COUNTER MEDICATION Sustain Ultra Eye drops  . pantoprazole (PROTONIX) 40 MG tablet Take 1 tab in the morning daily. (Patient taking differently: as needed. Take 1 tab in the morning  daily.)  . [DISCONTINUED] dicyclomine (BENTYL) 10 MG capsule Take 1 tab 3 times daily as needed for abdominal pain and cramping. (Patient taking differently: 2 (two) times daily. Take 1 tab 3 times daily as needed for abdominal pain and cramping.)  . rifaximin (XIFAXAN) 550 MG TABS tablet Take 1 tablet (550 mg total) by mouth 3 (three) times daily.  . [DISCONTINUED] Ascorbic Acid (VITAMIN C) 1000 MG tablet Take 1,000 mg by mouth daily.  . [DISCONTINUED] Cholecalciferol (VITAMIN D PO) Take 10,000 Units by mouth daily.   . [DISCONTINUED] cholestyramine (QUESTRAN) 4 g packet 1-2 packets a day for diarrhea  . [DISCONTINUED] ZETIA 10 MG tablet take 1 tablet by mouth at bedtime  . [DISCONTINUED] 0.9 %  sodium chloride infusion    No facility-administered encounter medications on file as of 12/29/2016.    Allergies  Allergen Reactions  . Hyzaar [Losartan Potassium-Hctz] Other (See Comments)    "messed up my sodium counts" swells up lips.  [name] [Pseudoephedrine Hcl] Palpitations  . Biaxin [Clarithromycin]     GI Upset  . Celexa [Citalopram]   . Flexeril [Cyclobenzaprine]     "zombie-like" feeling  . Fosamax [Alendronate Sodium]     GI upset  . Iohexol Hives     Code: HIVES, Desc: pt states she broke out in hive 20 yrs ago from IV contrast.     . Losartan     Angioedema  . Meloxicam     GI upset  . Norvasc [Amlodipine] Swelling  . Pseudoephedrine     Palpitations  . Zoloft [Sertraline Hcl]    Patient Active Problem List   Diagnosis Date Noted  . Dehydration 08/22/2016  . Headache 08/22/2016  . Thrombocytopenia ([place]) 05/02/2016  . Immunocompromised ([place]) 10/10/2015  . BMI 37.85,   adult 09/23/2015  . Environmental and seasonal allergies 08/15/2015  . Mixed hyperlipidemia 06/01/2015  . Medication management 06/01/2015  . Hypogammaglobulinemia ([place]) 12/21/2014  . Vitamin D deficiency 09/30/2014  . HTN (hypertension) 01/08/2014  . Prediabetes   . Depression, controlled   .  GERD (gastroesophageal reflux disease)   . OSA (obstructive sleep apnea)   . Angioedema secondary to ACE/ARB 12/25/2013  . Morbid obesity (BMI 37.95) 12/17/2013  . Hepatitis A 08/02/2012  . Recurrent sinus infections 08/02/2012  . CLL (chronic lymphocytic leukemia) ([place]) 11/22/2011   Social History   Social History  . Marital status: Married    Spouse name: N/A  . Number of children: 1  . Years of education: N/A   Occupational History  . CUST SERVICE [employer]   Social History Main Topics  . Smoking status: Former Smoker    Packs/day: 0.75    Years: 4.00    Types: Cigarettes    Quit date: 12/05/1968  . Smokeless tobacco: Never Used  . Alcohol use 8.4 oz/week    14 Glasses of wine per week     Comment: 08/02/2012 "couple glasses wine q hs; last time 2-3 wk ago"  . Drug use: No  . Sexual activity: Not Currently  Other Topics Concern  . Not on file   Social History Narrative  . No narrative on file    Ms. Belizaire's family history includes Heart attack in her maternal grandmother; Hypertension in her maternal grandmother and mother; Parkinson's disease in her mother.      Objective:    Vitals:   12/29/16 1432  BP: (!) 168/78  Pulse: 64    Physical Exam  well-developed older white female in no acute distress, somewhat anxious blood pressure 168/78 pulse 64, height 5 foot 2, weight 208, BMI 37.4. HEENT; nontraumatic normocephalic EOMI PERRLA sclera anicteric, Cardiovascular; regular rate and rhythm with S1-S2 no murmur or gallop, Pulmonary ;clear bilaterally, Abdomen ;obese soft , she has some mild tenderness in the right mid and right lower quadrant there is no guarding or rebound no palpable mass or hepatosplenomegaly, Rectal ;exam not done, Ext;no clubbing cyanosis or edema skin warm and dry, Neuropsych; mood and affect appropriate       Assessment & Plan:   #17 71 year old white female with 2-3 month history of persistent diarrhea and right-sided abdominal  discomfort. Patient continues to complain of urgency and intermittent incontinence. Workup thus far has been negative. Patient does have CLL-it is possible this may be contributory Will manage as IBS D for now #2 status post cholecystectomy #3 hypogammaglobulinemia #4 GERD #5 hypertension #6 morbid obesity  Plan; Will continue dicyclomine 10 mg by mouth 3 times daily and have asked her to take the third dose prior to bedtime She will finish current prescription for Questran and then stop as she has not noted any benefit and says she cannot afford it We'll treat with a course of Xifaxan 550 mg by mouth 3 times a day 14 days for IBS D/bacterial overgrowth-hopefully this will offer benefit I do feel that there is a component of underlying anxiety playing into this, she is very reluctant to takes Xanax on a regular basis. We discussed possible option of switching to Librax rather than Bentyl but we'll see how she does with Xifaxan first [employer] with avoidance of dairy and artificial sweeteners Have asked patient to call with a progress report after she completes a two-week course of Xifaxan.   Amy S Esterwood PA-C 12/29/2016   Cc: [name], MD   Addendum: Reviewed and agree with management. Jerene Bears, MD

## 2016-12-30 ENCOUNTER — Encounter: Payer: Self-pay | Admitting: Hematology

## 2016-12-30 NOTE — Progress Notes (Signed)
Faxed disability paerwork to [employer], attn: Montez Morita on 12/22/16 fax [phone]

## 2017-01-02 ENCOUNTER — Telehealth: Payer: Self-pay | Admitting: Physician Assistant

## 2017-01-02 ENCOUNTER — Telehealth: Payer: Self-pay | Admitting: *Deleted

## 2017-01-02 NOTE — Telephone Encounter (Signed)
I advised the patient to take 1 tab 3 times daily with meals. Take all the medication. The patient verbalized understanding the instructions.

## 2017-01-02 NOTE — Telephone Encounter (Signed)
Called the patient and advised we do have samples for her for the 14 day course of the Xifaxan 550 mg.  They are at the front desk . She said she will either be there this afternoon 01-02-2017 or tomorrow 01-03-2017.  She was very grateful.

## 2017-01-02 NOTE — Telephone Encounter (Signed)
Advised representative, Tracy,at [facility].  I advised them to cancel the prescription. Advised them we were able to get the patient samples .

## 2017-01-05 ENCOUNTER — Encounter: Payer: Self-pay | Admitting: Physician Assistant

## 2017-01-05 ENCOUNTER — Ambulatory Visit (INDEPENDENT_AMBULATORY_CARE_PROVIDER_SITE_OTHER): Payer: 59 | Admitting: Physician Assistant

## 2017-01-05 VITALS — BP 132/80 | HR 83 | Temp 97.3°F | Resp 14 | Ht 62.5 in | Wt 211.2 lb

## 2017-01-05 DIAGNOSIS — D849 Immunodeficiency, unspecified: Secondary | ICD-10-CM

## 2017-01-05 DIAGNOSIS — R197 Diarrhea, unspecified: Secondary | ICD-10-CM | POA: Diagnosis not present

## 2017-01-05 DIAGNOSIS — D801 Nonfamilial hypogammaglobulinemia: Secondary | ICD-10-CM | POA: Diagnosis not present

## 2017-01-05 DIAGNOSIS — R1011 Right upper quadrant pain: Secondary | ICD-10-CM

## 2017-01-05 DIAGNOSIS — D899 Disorder involving the immune mechanism, unspecified: Secondary | ICD-10-CM

## 2017-01-05 DIAGNOSIS — C9110 Chronic lymphocytic leukemia of B-cell type not having achieved remission: Secondary | ICD-10-CM

## 2017-01-05 DIAGNOSIS — D696 Thrombocytopenia, unspecified: Secondary | ICD-10-CM

## 2017-01-05 NOTE — Progress Notes (Signed)
Assessment and Plan:  CLL (chronic lymphocytic leukemia) ([place])  Has follow up next week to discuss  Suggest finding support group for CLL  Generalized anxiety disorder Continue lexapro, continue counseling at church  Diarrhea, unspecified type -    Continue the xifaxan, continue lexapro  Will go back to work Feb 9th, will be released back depending on this note Future Appointments Date Time Provider [place]  01/12/2017 3:30 PM [facility] ONLY [facility] None  01/12/2017 4:00 PM Show Low, MD [facility] None  01/30/2017 4:30 PM Vicie Mutters, PA-C [facility] None  02/27/2017 4:30 PM [name], MD [facility] None  10/11/2017 3:00 PM [name], MD [facility] None     HPI 71 y.o.female with history of CLL, Diarrhea, Ab pain and anxiety, states she is doing better.  Due to CLL fatigue being treated by Dr. Irene Limbo, has OV Feb 8th.   AB pain that is still being evaluated by GI, she has had right sided pain across back/chest with diarrhea, she has had normal CT pelvis, normal colonoscopy and EGD with normal BX. Negative h pylori, negative stool test, negative fecal fat, negative 5HIAA. She discontinued zetia without help. She is suppose to be on dicyclomine and 2 week treatment of xifaxan 550mg , started it Tuesday and she states she has been having less diarrhea and did not have any yesterday.  She has been on lexapro 10mg  and states she is feeling much better.  Her BP is doing better and she states she is feeling much better.    Past Medical History:  Diagnosis Date  . Anxiety   . Benign essential HTN 11/22/2011  . CLL (chronic lymphocytic leukemia) ([place])   . Depression   . GERD (gastroesophageal reflux disease)   . 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 High cholesterol   . History of bronchitis 08/02/2012   "maybe once/yr if that much; last time 01/2010"  . 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 OSA (obstructive sleep apnea)   .  Pneumonia 01/2010  . Prediabetes   . Recurrent sinus infections 08/02/2012     Allergies  Allergen Reactions  . Hyzaar [Losartan Potassium-Hctz] Other (See Comments)    "messed up my sodium counts" swells up lips.  [name] [Pseudoephedrine Hcl] Palpitations  . Biaxin [Clarithromycin]     GI Upset  . Celexa [Citalopram]   . Flexeril [Cyclobenzaprine]     "zombie-like" feeling  . Fosamax [Alendronate Sodium]     GI upset  . Iohexol Hives     Code: HIVES, Desc: pt states she broke out in hive 20 yrs ago from IV contrast.     . Losartan     Angioedema  . Meloxicam     GI upset  . Norvasc [Amlodipine] Swelling  . Pseudoephedrine     Palpitations  . Zoloft [Sertraline Hcl]     Current Outpatient Prescriptions on File Prior to Visit  Medication Sig  . ALPRAZolam (XANAX) 1 MG tablet TAKE 1/2 TO 1 TABLET 3 TIMES A DAY AS NEEDED FOR ANXIETY  . bisoprolol-hydrochlorothiazide (ZIAC) 10-6.25 MG tablet take 1 tablet by mouth once daily  . Colesevelam HCl (WELCHOL) 3.75 g PACK One packet daily with full glass of water for diarrhea  . dicyclomine (BENTYL) 10 MG capsule Take 1 tab 3 times daily as needed for abdominal pain and cramping.  . escitalopram (LEXAPRO) 20 MG tablet Take 1 tablet (20 mg total) by mouth daily. (Patient taking differently: Take 10 mg by mouth  daily. )  . fexofenadine (ALLEGRA) 180 MG tablet Take 1 tablet (180 mg total) by mouth daily. Take PRN for allergies.  [name] Kitchen FLORASTOR 250 MG capsule Take 250 mg by mouth daily.   . minoxidil (LONITEN) 2.5 MG tablet Take 1 tablet (2.5 mg total) by mouth daily. (Patient taking differently: Take 2.5 mg by mouth daily. HASNT STARTED YET)  . ondansetron (ZOFRAN) 4 MG tablet Take 1 tablet (4 mg total) by mouth daily as needed for nausea or vomiting.  [name] Kitchen OVER THE COUNTER MEDICATION Nasocort daily as needed  . OVER THE COUNTER MEDICATION Sustain Ultra Eye drops  . pantoprazole (PROTONIX) 40 MG tablet Take 1 tab in the morning daily.  (Patient taking differently: as needed. Take 1 tab in the morning daily.)  . rifaximin (XIFAXAN) 550 MG TABS tablet Take 1 tablet (550 mg total) by mouth 3 (three) times daily.   No current facility-administered medications on file prior to visit.     ROS: all negative except above.   Physical Exam: Filed Weights   01/05/17 1448  Weight: 211 lb 3.2 oz (95.8 kg)   BP 132/80   Pulse 83   Temp 97.3 F (36.3 C)   Resp 14   Ht 5' 2.5" (1.588 m)   Wt 211 lb 3.2 oz (95.8 kg)   SpO2 97%   BMI 38.01 kg/m  General Appearance: Well nourished, in no apparent distress, no diaphroesis Eyes: PERRLA, EOMs, conjunctiva no swelling or erythema Sinuses: + Frontal/maxillary tenderness ENT/Mouth: Ext aud canals clear, TMs without erythema, bulging. No erythema, swelling, or exudate on post pharynx.  Tonsils not swollen or erythematous. Hearing normal.  Neck: Supple, thyroid normal.  Respiratory: Respiratory effort normal, BS equal bilaterally without rales, rhonchi, wheezing or stridor.  Cardio: RRR with no MRGs. Brisk peripheral pulses without edema.  Abdomen: Soft, + BS, obese, diffusely overly tender to palpation, no peritoneal signs,  without rebound, hernias, masses. Lymphatics: Non tender without lymphadenopathy.  Musculoskeletal: Full ROM, 5/5 strength, Normal gait Strength is normal and symmetric in arms. Skin:  Warm, dry without rashes, lesions, ecchymosis.  Neuro: Cranial nerves intact. Normal muscle tone, no cerebellar symptoms. Psych: Awake and oriented X 3, normal affect, appears anxious, Insight and Judgment appropriate.     Vicie Mutters, PA-C 5:31 PM [facility]

## 2017-01-08 NOTE — Progress Notes (Signed)
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 (patient reports -planned for August 2017) 13q deletion dose pre-dispose her to recurrent SCC  Current Treatment: observation  Previous treatment: IVIG for several months last winter to reduce recurrent respiratory infections (Patient notes that this helped) Has no required definitive treatment for CLL at this time  INTERVAL HISTORY:  Patient is here for her scheduled f/u for CLL. She notes that her area of Moh's surgery on the nose has healed well. She notes that recently was treated with Zpak for sinus infection. She notes that she has been having a lot of issues with diarrhea and is being workup by GI. She has had a normal CT abd pelvis, normal colonoscopy and EGD with normal BX. Negative h pylori, negative stool test, negative fecal fat. GI pathogen panel neg. pancreastic elastase WNL, 24h urinary 5-HIAA levels WNL, fecal lactoferrin WNL.    REVIEW OF SYSTEMS:    10 Point review of systems of done and is negative except as noted above.  . Past Medical History:  Diagnosis Date  . Anxiety   . Benign essential HTN 11/22/2011  . CLL (chronic lymphocytic leukemia) ([place])   . Depression   . GERD (gastroesophageal reflux disease)   . 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 High cholesterol   . History of bronchitis 08/02/2012   "maybe once/yr if that much; last time 01/2010"  . 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 OSA (obstructive sleep apnea)   . Pneumonia 01/2010  . Prediabetes   . Recurrent sinus infections 08/02/2012    . Past Surgical History:  Procedure Laterality Date  . ABDOMINAL HYSTERECTOMY  1980's   "endometrosis"  . BREAST  SURGERY    . CHOLECYSTECTOMY  1985  . FUNCTIONAL ENDOSCOPIC SINUS SURGERY  1990's   "cause I kept having sinus infections"  . immunoglobulin treatment  2017  . LAPAROSCOPIC APPENDECTOMY N/A 06/04/2014   Procedure: APPENDECTOMY LAPAROSCOPIC;  Surgeon: Zenovia Jarred, MD;  Location: [facility];  Service: General;  Laterality: N/A;  . LYMPH NODE BIOPSY     "determined I had CLL"  . TEMPOROMANDIBULAR JOINT ARTHROPLASTY  1980's  . TONSILLECTOMY AND ADENOIDECTOMY  1950's    . Social History  Substance Use Topics  . Smoking status: Former Smoker    Packs/day: 0.75    Years: 4.00    Types: Cigarettes    Quit date: 12/05/1968  . Smokeless tobacco: Never Used  . Alcohol use 8.4 oz/week    14 Glasses of wine per week     Comment: 08/02/2012 "couple glasses wine q hs; last time 2-3 wk ago"    ALLERGIES:  is allergic to hyzaar [losartan potassium-hctz]; sudafed [pseudoephedrine hcl]; biaxin [clarithromycin]; celexa [citalopram]; flexeril [cyclobenzaprine]; fosamax [alendronate sodium]; iohexol; losartan; meloxicam; norvasc [amlodipine]; pseudoephedrine; and zoloft [sertraline hcl].  MEDICATIONS:  Current Outpatient Prescriptions  Medication Sig Dispense Refill  . ALPRAZolam (XANAX) 1 MG tablet TAKE 1/2 TO 1 TABLET 3 TIMES A DAY AS NEEDED FOR ANXIETY 90 tablet 0  . bisoprolol-hydrochlorothiazide (ZIAC) 10-6.25 MG tablet take 1 tablet by mouth once daily 90 tablet 1  . Colesevelam HCl (WELCHOL) 3.75 g PACK One packet daily with full glass of water  for diarrhea 30 each 0  . dicyclomine (BENTYL) 10 MG capsule Take 1 tab 3 times daily as needed for abdominal pain and cramping. 90 capsule 6  . escitalopram (LEXAPRO) 20 MG tablet Take 1 tablet (20 mg total) by mouth daily. (Patient taking differently: Take 10 mg by mouth daily. ) 30 tablet 2  . fexofenadine (ALLEGRA) 180 MG tablet Take 1 tablet (180 mg total) by mouth daily. Take PRN for allergies. 30 tablet 6  . FLORASTOR 250 MG capsule Take 250 mg by  mouth daily.   1  . minoxidil (LONITEN) 2.5 MG tablet Take 1 tablet (2.5 mg total) by mouth daily. (Patient taking differently: Take 2.5 mg by mouth daily. HASNT STARTED YET) 30 tablet 3  . ondansetron (ZOFRAN) 4 MG tablet Take 1 tablet (4 mg total) by mouth daily as needed for nausea or vomiting. 30 tablet 1  . OVER THE COUNTER MEDICATION Nasocort daily as needed    . OVER THE COUNTER MEDICATION Sustain Ultra Eye drops    . pantoprazole (PROTONIX) 40 MG tablet Take 1 tab in the morning daily. (Patient taking differently: as needed. Take 1 tab in the morning daily.) 90 tablet 3  . rifaximin (XIFAXAN) 550 MG TABS tablet Take 1 tablet (550 mg total) by mouth 3 (three) times daily. 42 tablet 0   No current facility-administered medications for this visit.     PHYSICAL EXAMINATION: ECOG PERFORMANCE STATUS: 1 - Symptomatic but completely ambulatory  . Vitals:   12/15/16 1532  BP: (!) 165/60  Pulse: 73  Resp: 18  Temp: 98.1 F (36.7 C)    Filed Weights   12/15/16 1532  Weight: 213 lb 9.6 oz (96.9 kg)   .Body mass index is 38.45 kg/m.  GENERAL:alert, in no acute distress and comfortable SKIN: no acute rashes , healing nose surgical wound. EYES: normal, conjunctiva are pink and non-injected, sclera clear OROPHARYNX:no exudate, no erythema and lips, buccal mucosa, and tongue normal  NECK: supple, no JVD, thyroid normal size, non-tender, without nodularity LYMPH:  no palpable lymphadenopathy in the cervical, axillary or inguinal LUNGS: clear to auscultation with normal respiratory effort HEART: regular rate & rhythm,  no murmurs and no lower extremity edema ABDOMEN: abdomen soft, non-tender, normoactive bowel sounds , no palpable hepatosplenomegaly Musculoskeletal: no cyanosis of digits and no clubbing  PSYCH: alert & oriented x 3 with fluent speech NEURO: no focal motor/sensory deficits  LABORATORY DATA:   . CBC Latest Ref Rng & Units 12/15/2016 11/24/2016 11/18/2016  WBC 3.9 -  10.3 10e3/uL 136.3(HH) 87.8(H) 94.5(HH)  Hemoglobin 11.6 - 15.9 g/dL 10.5(L) 11.1(L) 11.2(L)  Hematocrit 34.8 - 46.6 % 34.1(L) 34.5(L) 33.9(L)  Platelets 145 - 400 10e3/uL 117(L) 110(L) 89(L)     . CMP Latest Ref Rng & Units 12/15/2016 11/24/2016 11/18/2016  Glucose 70 - 140 mg/dl 96 97 92  BUN 7.0 - 26.0 mg/dL 14.4 16 13.7  Creatinine 0.6 - 1.1 mg/dL 0.8 0.91 0.9  Sodium 136 - 145 mEq/L 133(L) 137 137  Potassium 3.5 - 5.1 mEq/L 4.2 3.5 3.9  Chloride 98 - 110 mmol/L - 102 -  CO2 22 - 29 mEq/L 24 19(L) 24  Calcium 8.4 - 10.4 mg/dL 9.3 9.6 9.4  Total Protein 6.4 - 8.3 g/dL 6.6 6.9 6.9  Total Bilirubin 0.20 - 1.20 mg/dL 0.72 0.6 0.57  Alkaline Phos 40 - 150 U/L 76 58 69  AST 5 - 34 U/L 22 27 23   ALT 0 - 55 U/L 21 19 19  RADIOGRAPHIC STUDIES: I have personally reviewed the radiological images as listed and agreed with the findings in the report.  CT ABDOMEN AND PELVIS WITHOUT CONTRAST   IMPRESSION: 1. No acute abnormalities within the abdomen or pelvis. 2. Mild splenomegaly and several prominent to mildly enlarged pelvic and inguinal lymph nodes. These findings are similar to the prior CT. 3. Small nonobstructing stone in the lower pole the right kidney. No ureteral stones or obstructive uropathy.   Electronically Signed   By: Lajean Manes M.D.   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 Only intervention thus far has been intermittent IVIG, clinically very helpful with decrease in respiratory infections.  Last imaging was CT AP 02-2015. --showed no evidence of Splenomegaly. Negative Hep B serology 2015   Lymphocyte count has increased to 136k No overt new constitutional symptoms except mild unchanged fatigue. LDH WNL  2. Mild Anemia Hgb now <11 (10.5 today) 3. Moderate  thrombocytopenia PLT  PLT count today 117K 4. 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 Has been taking good infection prevention precautions. Has had significant recurrent headaches with IVIG - now held. 5. 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 No issues with infection-   her 13q deletion places her at risk for recurrent SCC. 6. Diarrhea -- this has been a significant and new issue. She has been following with Dr Hilarie Fredrickson and has had an extensive workup without a clear etiology. Random colonic biopsies - show no overt lymphocytic infiltrate PLAN -we discussed her current lab findings -we discussed that there is no clear way to prove or disprove if the CLL is causing mural lymphocytic infiltration of the bowel -- though CT was benign. Not very common but has been published in literature. -we will let her complete her GI workup and try to have her symptoms controlled with GI/PCP. -if no other alternative etiology noted-- we discussed that treating [name] in the setting of anemia, possible related CLL and fatigue might be reasonable. Patient wants to think about this. -will continue to hold IVIG  Due to significant meningimus like headaches. -continue close f/u with dermatologist for evaluation and management of non melanoma skin cancers that can be increased in patient with CLL with 13q deletion.  RTC with Dr Irene Limbo in 4 weeks with rpt labs. Earlier If any other acute concerns.  I spent 20 minutes counseling the patient face to face. The total time spent in the appointment was 25 minutes and more than 50% was on counseling and direct patient cares.    Sullivan Lone MD [place] AAHIVMS [facility] Hematology/Oncology Physician [facility]  (Office):       [phone] (Work cell):  [phone] (Fax):           [phone]

## 2017-01-10 ENCOUNTER — Other Ambulatory Visit: Payer: Self-pay | Admitting: Internal Medicine

## 2017-01-11 ENCOUNTER — Encounter: Payer: Self-pay | Admitting: Physician Assistant

## 2017-01-12 ENCOUNTER — Other Ambulatory Visit (HOSPITAL_BASED_OUTPATIENT_CLINIC_OR_DEPARTMENT_OTHER): Payer: 59

## 2017-01-12 ENCOUNTER — Telehealth: Payer: Self-pay | Admitting: Hematology

## 2017-01-12 ENCOUNTER — Ambulatory Visit (HOSPITAL_BASED_OUTPATIENT_CLINIC_OR_DEPARTMENT_OTHER): Payer: 59 | Admitting: Hematology

## 2017-01-12 VITALS — BP 153/52 | HR 60 | Temp 97.8°F | Resp 18 | Ht 62.5 in | Wt 209.5 lb

## 2017-01-12 DIAGNOSIS — R197 Diarrhea, unspecified: Secondary | ICD-10-CM | POA: Diagnosis not present

## 2017-01-12 DIAGNOSIS — D649 Anemia, unspecified: Secondary | ICD-10-CM

## 2017-01-12 DIAGNOSIS — D801 Nonfamilial hypogammaglobulinemia: Secondary | ICD-10-CM | POA: Diagnosis not present

## 2017-01-12 DIAGNOSIS — C9110 Chronic lymphocytic leukemia of B-cell type not having achieved remission: Secondary | ICD-10-CM

## 2017-01-12 DIAGNOSIS — D696 Thrombocytopenia, unspecified: Secondary | ICD-10-CM | POA: Diagnosis not present

## 2017-01-12 DIAGNOSIS — I10 Essential (primary) hypertension: Secondary | ICD-10-CM

## 2017-01-12 LAB — COMPREHENSIVE METABOLIC PANEL
ALT: 15 U/L (ref 0–55)
ANION GAP: 9 meq/L (ref 3–11)
AST: 22 U/L (ref 5–34)
Albumin: 4.2 g/dL (ref 3.5–5.0)
Alkaline Phosphatase: 57 U/L (ref 40–150)
BILIRUBIN TOTAL: 0.56 mg/dL (ref 0.20–1.20)
BUN: 15.2 mg/dL (ref 7.0–26.0)
CHLORIDE: 101 meq/L (ref 98–109)
CO2: 25 meq/L (ref 22–29)
Calcium: 9.5 mg/dL (ref 8.4–10.4)
Creatinine: 0.8 mg/dL (ref 0.6–1.1)
EGFR: 70 mL/min/{1.73_m2} — AB (ref >90)
GLUCOSE: 105 mg/dL (ref 70–140)
POTASSIUM: 4.1 meq/L (ref 3.5–5.1)
SODIUM: 135 meq/L — AB (ref 136–145)
Total Protein: 6.5 g/dL (ref 6.4–8.3)

## 2017-01-12 LAB — CBC & DIFF AND RETIC
BASO%: 0.2 % (ref 0.0–2.0)
Basophils Absolute: 0.1 10*3/uL (ref 0.0–0.1)
EOS ABS: 0.2 10*3/uL (ref 0.0–0.5)
EOS%: 0.2 % (ref 0.0–7.0)
HCT: 34.3 % — ABNORMAL LOW (ref 34.8–46.6)
HGB: 11.3 g/dL — ABNORMAL LOW (ref 11.6–15.9)
IMMATURE RETIC FRACT: 15.7 % — AB (ref 1.60–10.00)
LYMPH%: 92.2 % — AB (ref 14.0–49.7)
MCH: 30.7 pg (ref 25.1–34.0)
MCHC: 32.9 g/dL (ref 31.5–36.0)
MCV: 93.2 fL (ref 79.5–101.0)
MONO#: 0.8 10*3/uL (ref 0.1–0.9)
MONO%: 1.2 % (ref 0.0–14.0)
NEUT%: 6.2 % — ABNORMAL LOW (ref 38.4–76.8)
NEUTROS ABS: 4.3 10*3/uL (ref 1.5–6.5)
NRBC: 0 % (ref 0–0)
PLATELETS: 77 10*3/uL — AB (ref 145–400)
RBC: 3.68 10*6/uL — AB (ref 3.70–5.45)
RDW: 14.6 % — AB (ref 11.2–14.5)
Retic %: 1.73 % (ref 0.70–2.10)
Retic Ct Abs: 63.66 10*3/uL (ref 33.70–90.70)
WBC: 69 10*3/uL — AB (ref 3.9–10.3)
lymph#: 63.6 10*3/uL — ABNORMAL HIGH (ref 0.9–3.3)

## 2017-01-12 LAB — TECHNOLOGIST REVIEW

## 2017-01-12 LAB — LACTATE DEHYDROGENASE: LDH: 201 U/L (ref 125–245)

## 2017-01-12 NOTE — Telephone Encounter (Signed)
Appointments scheduled per 2/8 LOS. Patient given AVS report and calendars with future scheduled appointments.  °

## 2017-01-13 LAB — TSH: TSH: 3.114 m(IU)/L (ref 0.308–3.960)

## 2017-01-13 LAB — IGG: IgG, Qn, Serum: 381 mg/dL — ABNORMAL LOW (ref 700–1600)

## 2017-01-15 NOTE — Progress Notes (Signed)
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 (patient reports -planned for August 2017) 13q deletion does pre-dispose her to recurrent SCC  Current Treatment: observation  Previous treatment: IVIG for several months last winter to reduce recurrent respiratory infections (Patient notes that this helped) Has no required definitive treatment for CLL at this time  INTERVAL HISTORY:  Patient is here for her scheduled f/u for CLL. She notes that her diarrhea has very much resolved and that she is eating much better. She was started on rifaximin by her gastroenterologist which seems to have helped. No other acute new symptoms. No fevers no chills or night sweats. Platelet counts are somewhat lower today at 77k (but does have some platelet clumping), could partly be due to her antibiotic. Her WBC count is down to 69k and hemoglobin is stable at 11.3. Patient notes no overt new lymphadenopathy. She appears more comfortable physically and emotionally.   REVIEW OF SYSTEMS:    10 Point review of systems of done and is negative except as noted above.  . Past Medical History:  Diagnosis Date  . Anxiety   . Benign essential HTN 11/22/2011  . CLL (chronic lymphocytic leukemia) ([place])   . Depression   . GERD (gastroesophageal reflux disease)   . 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 High cholesterol   . History of bronchitis 08/02/2012   "maybe once/yr if that much; last time 01/2010"  . 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 OSA (obstructive sleep apnea)   . Pneumonia 01/2010  . Prediabetes   . Recurrent sinus infections 08/02/2012    . Past Surgical History:  Procedure Laterality Date  .  ABDOMINAL HYSTERECTOMY  1980's   "endometrosis"  . BREAST SURGERY    . CHOLECYSTECTOMY  1985  . FUNCTIONAL ENDOSCOPIC SINUS SURGERY  1990's   "cause I kept having sinus infections"  . immunoglobulin treatment  2017  . LAPAROSCOPIC APPENDECTOMY N/A 06/04/2014   Procedure: APPENDECTOMY LAPAROSCOPIC;  Surgeon: Zenovia Jarred, MD;  Location: [facility];  Service: General;  Laterality: N/A;  . LYMPH NODE BIOPSY     "determined I had CLL"  . TEMPOROMANDIBULAR JOINT ARTHROPLASTY  1980's  . TONSILLECTOMY AND ADENOIDECTOMY  1950's    . Social History  Substance Use Topics  . Smoking status: Former Smoker    Packs/day: 0.75    Years: 4.00    Types: Cigarettes    Quit date: 12/05/1968  . Smokeless tobacco: Never Used  . Alcohol use 8.4 oz/week    14 Glasses of wine per week     Comment: 08/02/2012 "couple glasses wine q hs; last time 2-3 wk ago"    ALLERGIES:  is allergic to hyzaar [losartan potassium-hctz]; sudafed [pseudoephedrine hcl]; biaxin [clarithromycin]; celexa [citalopram]; flexeril [cyclobenzaprine]; fosamax [alendronate sodium]; iohexol; losartan; meloxicam; norvasc [amlodipine]; pseudoephedrine; and zoloft [sertraline hcl].  MEDICATIONS:  Current Outpatient Prescriptions  Medication Sig Dispense Refill  . ALPRAZolam (XANAX) 1 MG tablet TAKE 1/2 TO 1 TABLET 3 TIMES A DAY AS NEEDED FOR ANXIETY 90 tablet 0  . bisoprolol-hydrochlorothiazide (ZIAC) 10-6.25 MG tablet take 1 tablet by mouth once daily 90 tablet 1  . dicyclomine (BENTYL) 10 MG capsule Take 1 tab 3 times  daily as needed for abdominal pain and cramping. 90 capsule 6  . escitalopram (LEXAPRO) 20 MG tablet Take 1 tablet (20 mg total) by mouth daily. (Patient taking differently: Take 10 mg by mouth daily. ) 30 tablet 2  . fexofenadine (ALLEGRA) 180 MG tablet Take 1 tablet (180 mg total) by mouth daily. Take PRN for allergies. 30 tablet 6  . FLORASTOR 250 MG capsule Take 250 mg by mouth daily.   1  . OVER THE COUNTER MEDICATION  Nasocort daily as needed    . OVER THE COUNTER MEDICATION Sustain Ultra Eye drops    . pantoprazole (PROTONIX) 40 MG tablet Take 1 tab in the morning daily. (Patient taking differently: as needed. Take 1 tab in the morning daily.) 90 tablet 3   No current facility-administered medications for this visit.     PHYSICAL EXAMINATION: ECOG PERFORMANCE STATUS: 1 - Symptomatic but completely ambulatory  . Vitals:   01/12/17 1542  BP: (!) 153/52  Pulse: 60  Resp: 18  Temp: 97.8 F (36.6 C)    Filed Weights   01/12/17 1542  Weight: 209 lb 8 oz (95 kg)   .Body mass index is 37.71 kg/m.  GENERAL:alert, in no acute distress and comfortable SKIN: no acute rashes , healing nose surgical wound. EYES: normal, conjunctiva are pink and non-injected, sclera clear OROPHARYNX:no exudate, no erythema and lips, buccal mucosa, and tongue normal  NECK: supple, no JVD, thyroid normal size, non-tender, without nodularity LYMPH:  no palpable lymphadenopathy in the cervical, axillary or inguinal LUNGS: clear to auscultation with normal respiratory effort HEART: regular rate & rhythm,  no murmurs and no lower extremity edema ABDOMEN: abdomen soft, non-tender, normoactive bowel sounds , no palpable hepatosplenomegaly Musculoskeletal: no cyanosis of digits and no clubbing  PSYCH: alert & oriented x 3 with fluent speech NEURO: no focal motor/sensory deficits  LABORATORY DATA:   . CBC Latest Ref Rng & Units 01/12/2017 12/15/2016 11/24/2016  WBC 3.9 - 10.3 10e3/uL 69.0(HH) 136.3(HH) 87.8(H)  Hemoglobin 11.6 - 15.9 g/dL 11.3(L) 10.5(L) 11.1(L)  Hematocrit 34.8 - 46.6 % 34.3(L) 34.1(L) 34.5(L)  Platelets 145 - 400 10e3/uL 77(L) 117(L) 110(L)     . CMP Latest Ref Rng & Units 01/12/2017 12/15/2016 11/24/2016  Glucose 70 - 140 mg/dl 105 96 97  BUN 7.0 - 26.0 mg/dL 15.2 14.4 16  Creatinine 0.6 - 1.1 mg/dL 0.8 0.8 0.91  Sodium 136 - 145 mEq/L 135(L) 133(L) 137  Potassium 3.5 - 5.1 mEq/L 4.1 4.2 3.5    Chloride 98 - 110 mmol/L - - 102  CO2 22 - 29 mEq/L 25 24 19(L)  Calcium 8.4 - 10.4 mg/dL 9.5 9.3 9.6  Total Protein 6.4 - 8.3 g/dL 6.5 6.6 6.9  Total Bilirubin 0.20 - 1.20 mg/dL 0.56 0.72 0.6  Alkaline Phos 40 - 150 U/L 57 76 58  AST 5 - 34 U/L 22 22 27   ALT 0 - 55 U/L 15 21 19       RADIOGRAPHIC STUDIES: I have personally reviewed the radiological images as listed and agreed with the findings in the report.  CT ABDOMEN AND PELVIS WITHOUT CONTRAST   IMPRESSION: 1. No acute abnormalities within the abdomen or pelvis. 2. Mild splenomegaly and several prominent to mildly enlarged pelvic and inguinal lymph nodes. These findings are similar to the prior CT. 3. Small nonobstructing stone in the lower pole the right kidney. No ureteral stones or obstructive uropathy.   Electronically Signed   By: Dedra Skeens.D.  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 Only intervention thus far has been intermittent IVIG, clinically very helpful with decrease in respiratory infections.  Last imaging was CT AP 02-2015. --showed no evidence of Splenomegaly. Negative Hep B serology 2015   Lymphocyte count had increased to 136k now back down to 69k No overt new constitutional symptoms except mild unchanged fatigue. LDH WNL  2. Mild Anemia Hgb hemoglobin stable at 11.3 3. Moderate thrombocytopenia PLT  PLT count today 77k however noted to have some platelet clumping and is also rifaximin that could be doing it. 4. 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 Has been taking good infection prevention precautions. Has had significant recurrent headaches with IVIG - now held. 5. 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 No issues with infection-   her 13q deletion places her at risk for  recurrent SCC. 6. Diarrhea -- this has been a significant and new issue. She has been following with Dr Hilarie Fredrickson and has had an extensive workup without a clear etiology. Random colonic biopsies - show no overt lymphocytic infiltrate Her diarrhea has nearly resolved with the use of rifaximin. PLAN -we discussed her current lab findings -will continue to hold IVIG  Due to significant meningimus like headaches. -Will need to monitor her platelet counts which are 77k but this is likely due to her antibiotic use. -We will need to followup her labs in 4 weeks. If worsening cytopenias might need to start treatment of [name]. -continue close f/u with dermatologist for evaluation and management of non melanoma skin cancers that can be increased in patient with CLL with 13q deletion.  RTC with Dr Irene Limbo in 4 weeks with rpt labs. Earlier If any other acute concerns.  I spent 20 minutes counseling the patient face to face. The total time spent in the appointment was 20 minutes and more than 50% was on counseling and direct patient cares.    Sullivan Lone MD [place] AAHIVMS [facility] Hematology/Oncology Physician [facility]  (Office):       [phone] (Work cell):  [phone] (Fax):           [phone]

## 2017-01-19 ENCOUNTER — Telehealth: Payer: Self-pay | Admitting: Physician Assistant

## 2017-01-19 NOTE — Telephone Encounter (Signed)
Spoke with Rebecca Bond. She states she has not had any diarrhea in a while. She feels better and is without cramps or abdominal discomfort. She now feels constipated and bloated. Last bowel movement was 4 days ago. Imodium not taken since she started the Xifaxan. Last took Bentyl 2 days ago. Please advise.

## 2017-01-20 NOTE — Telephone Encounter (Signed)
She can take a dose or 2 of Miralax  As needed

## 2017-01-20 NOTE — Telephone Encounter (Signed)
Patient calling back regarding this.  °

## 2017-01-20 NOTE — Telephone Encounter (Signed)
Left patient this information on her voicemail. DPR on file.

## 2017-01-30 ENCOUNTER — Ambulatory Visit: Payer: Self-pay | Admitting: Physician Assistant

## 2017-02-09 ENCOUNTER — Telehealth: Payer: Self-pay | Admitting: Hematology

## 2017-02-09 ENCOUNTER — Ambulatory Visit (HOSPITAL_BASED_OUTPATIENT_CLINIC_OR_DEPARTMENT_OTHER): Payer: 59 | Admitting: Hematology

## 2017-02-09 ENCOUNTER — Other Ambulatory Visit (HOSPITAL_BASED_OUTPATIENT_CLINIC_OR_DEPARTMENT_OTHER): Payer: 59

## 2017-02-09 VITALS — BP 159/74 | HR 73 | Temp 97.8°F | Resp 18 | Wt 210.2 lb

## 2017-02-09 DIAGNOSIS — D649 Anemia, unspecified: Secondary | ICD-10-CM | POA: Diagnosis not present

## 2017-02-09 DIAGNOSIS — C9110 Chronic lymphocytic leukemia of B-cell type not having achieved remission: Secondary | ICD-10-CM

## 2017-02-09 DIAGNOSIS — D72820 Lymphocytosis (symptomatic): Secondary | ICD-10-CM | POA: Diagnosis not present

## 2017-02-09 DIAGNOSIS — D801 Nonfamilial hypogammaglobulinemia: Secondary | ICD-10-CM | POA: Diagnosis not present

## 2017-02-09 LAB — CBC & DIFF AND RETIC
BASO%: 0.2 % (ref 0.0–2.0)
BASOS ABS: 0.2 10*3/uL — AB (ref 0.0–0.1)
EOS ABS: 0.2 10*3/uL (ref 0.0–0.5)
EOS%: 0.2 % (ref 0.0–7.0)
HCT: 34.3 % — ABNORMAL LOW (ref 34.8–46.6)
HEMOGLOBIN: 11 g/dL — AB (ref 11.6–15.9)
IMMATURE RETIC FRACT: 20.2 % — AB (ref 1.60–10.00)
LYMPH%: 92.5 % — AB (ref 14.0–49.7)
MCH: 30.4 pg (ref 25.1–34.0)
MCHC: 32.1 g/dL (ref 31.5–36.0)
MCV: 94.8 fL (ref 79.5–101.0)
MONO#: 1.3 10*3/uL — AB (ref 0.1–0.9)
MONO%: 1.4 % (ref 0.0–14.0)
NEUT%: 5.7 % — ABNORMAL LOW (ref 38.4–76.8)
NEUTROS ABS: 5.3 10*3/uL (ref 1.5–6.5)
NRBC: 0 % (ref 0–0)
PLATELETS: 79 10*3/uL — AB (ref 145–400)
RBC: 3.62 10*6/uL — AB (ref 3.70–5.45)
RDW: 14.8 % — ABNORMAL HIGH (ref 11.2–14.5)
RETIC CT ABS: 73.12 10*3/uL (ref 33.70–90.70)
Retic %: 2.02 % (ref 0.70–2.10)
WBC: 92.7 10*3/uL — AB (ref 3.9–10.3)
lymph#: 85.7 10*3/uL — ABNORMAL HIGH (ref 0.9–3.3)

## 2017-02-09 LAB — LACTATE DEHYDROGENASE: LDH: 198 U/L (ref 125–245)

## 2017-02-09 LAB — COMPREHENSIVE METABOLIC PANEL
ALT: 13 U/L (ref 0–55)
AST: 20 U/L (ref 5–34)
Albumin: 4.1 g/dL (ref 3.5–5.0)
Alkaline Phosphatase: 67 U/L (ref 40–150)
Anion Gap: 10 mEq/L (ref 3–11)
BILIRUBIN TOTAL: 0.84 mg/dL (ref 0.20–1.20)
BUN: 19 mg/dL (ref 7.0–26.0)
CO2: 25 meq/L (ref 22–29)
CREATININE: 0.8 mg/dL (ref 0.6–1.1)
Calcium: 9.6 mg/dL (ref 8.4–10.4)
Chloride: 101 mEq/L (ref 98–109)
EGFR: 73 mL/min/{1.73_m2} — ABNORMAL LOW (ref >90)
GLUCOSE: 91 mg/dL (ref 70–140)
Potassium: 3.9 mEq/L (ref 3.5–5.1)
SODIUM: 136 meq/L (ref 136–145)
TOTAL PROTEIN: 6.5 g/dL (ref 6.4–8.3)

## 2017-02-09 LAB — TECHNOLOGIST REVIEW

## 2017-02-09 NOTE — Telephone Encounter (Signed)
Gave patient AVS and calender per 02/09/2017 los. 

## 2017-02-10 ENCOUNTER — Telehealth: Payer: Self-pay | Admitting: Hematology

## 2017-02-10 NOTE — Telephone Encounter (Signed)
Faxed office notes to [employer] [phone]

## 2017-02-13 NOTE — Progress Notes (Signed)
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 (patient reports -planned for August 2017) 13q deletion does pre-dispose her to recurrent SCC  Current Treatment: observation  Previous treatment: IVIG for several months last winter to reduce recurrent respiratory infections (Patient notes that this helped) Has no required definitive treatment for CLL at this time  INTERVAL HISTORY:  Patient is here for her scheduled f/u for CLL. She notes she is doing well and the diarrhea has still remained away. No fevers or chills. No overt bruising. No acute infections. She is back at work. Her platelet counts are still low at 79k. Hemoglobin is relatively stable at 11. We discussed that her low platelet counts does bring up the option for possible treatment of [name]. We discussed treatment options including recommendation for consideration of Ibrutinib vs BR. She was given information about these and wanted to think about this and follow-up in one month.  REVIEW OF SYSTEMS:    10 Point review of systems of done and is negative except as noted above.  . Past Medical History:  Diagnosis Date  . Anxiety   . Benign essential HTN 11/22/2011  . CLL (chronic lymphocytic leukemia) ([place])   . Depression   . GERD (gastroesophageal reflux disease)   . 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 High cholesterol   . History of bronchitis 08/02/2012   "maybe once/yr if that much; last time 01/2010"  . 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 OSA (obstructive sleep apnea)   . Pneumonia 01/2010  . Prediabetes   . Recurrent sinus infections 08/02/2012    . Past Surgical History:  Procedure Laterality Date  . ABDOMINAL  HYSTERECTOMY  1980's   "endometrosis"  . BREAST SURGERY    . CHOLECYSTECTOMY  1985  . FUNCTIONAL ENDOSCOPIC SINUS SURGERY  1990's   "cause I kept having sinus infections"  . immunoglobulin treatment  2017  . LAPAROSCOPIC APPENDECTOMY N/A 06/04/2014   Procedure: APPENDECTOMY LAPAROSCOPIC;  Surgeon: Zenovia Jarred, MD;  Location: [facility];  Service: General;  Laterality: N/A;  . LYMPH NODE BIOPSY     "determined I had CLL"  . TEMPOROMANDIBULAR JOINT ARTHROPLASTY  1980's  . TONSILLECTOMY AND ADENOIDECTOMY  1950's    . Social History  Substance Use Topics  . Smoking status: Former Smoker    Packs/day: 0.75    Years: 4.00    Types: Cigarettes    Quit date: 12/05/1968  . Smokeless tobacco: Never Used  . Alcohol use 8.4 oz/week    14 Glasses of wine per week     Comment: 08/02/2012 "couple glasses wine q hs; last time 2-3 wk ago"    ALLERGIES:  is allergic to hyzaar [losartan potassium-hctz]; sudafed [pseudoephedrine hcl]; biaxin [clarithromycin]; celexa [citalopram]; flexeril [cyclobenzaprine]; fosamax [alendronate sodium]; iohexol; losartan; meloxicam; norvasc [amlodipine]; pseudoephedrine; and zoloft [sertraline hcl].  MEDICATIONS:  Current Outpatient Prescriptions  Medication Sig Dispense Refill  . ALPRAZolam (XANAX) 1 MG tablet TAKE 1/2 TO 1 TABLET 3 TIMES A DAY AS NEEDED FOR ANXIETY 90 tablet 0  . bisoprolol-hydrochlorothiazide (ZIAC) 10-6.25 MG tablet take 1 tablet by mouth once daily 90 tablet 1  . escitalopram (LEXAPRO) 20 MG tablet Take 1 tablet (20 mg  total) by mouth daily. (Patient taking differently: Take 10 mg by mouth daily. ) 30 tablet 2  . fexofenadine (ALLEGRA) 180 MG tablet Take 1 tablet (180 mg total) by mouth daily. Take PRN for allergies. 30 tablet 6  . FLORASTOR 250 MG capsule Take 250 mg by mouth daily.   1  . OVER THE COUNTER MEDICATION Nasocort daily as needed    . OVER THE COUNTER MEDICATION Sustain Ultra Eye drops    . pantoprazole (PROTONIX) 40 MG tablet Take  1 tab in the morning daily. (Patient taking differently: as needed. Take 1 tab in the morning daily.) 90 tablet 3   No current facility-administered medications for this visit.     PHYSICAL EXAMINATION: ECOG PERFORMANCE STATUS: 1 - Symptomatic but completely ambulatory  . Vitals:   02/09/17 1551  BP: (!) 159/74  Pulse: 73  Resp: 18  Temp: 97.8 F (36.6 C)    Filed Weights   02/09/17 1551  Weight: 210 lb 3.2 oz (95.3 kg)   .Body mass index is 37.83 kg/m.  GENERAL:alert, in no acute distress and comfortable SKIN: no acute rashes , healing nose surgical wound. EYES: normal, conjunctiva are pink and non-injected, sclera clear OROPHARYNX:no exudate, no erythema and lips, buccal mucosa, and tongue normal  NECK: supple, no JVD, thyroid normal size, non-tender, without nodularity LYMPH:  no palpable lymphadenopathy in the cervical, axillary or inguinal LUNGS: clear to auscultation with normal respiratory effort HEART: regular rate & rhythm,  no murmurs and no lower extremity edema ABDOMEN: abdomen soft, non-tender, normoactive bowel sounds , no palpable hepatosplenomegaly Musculoskeletal: no cyanosis of digits and no clubbing  PSYCH: alert & oriented x 3 with fluent speech NEURO: no focal motor/sensory deficits  LABORATORY DATA:   . CBC Latest Ref Rng & Units 02/09/2017 01/12/2017 12/15/2016  WBC 3.9 - 10.3 10e3/uL 92.7(HH) 69.0(HH) 136.3(HH)  Hemoglobin 11.6 - 15.9 g/dL 11.0(L) 11.3(L) 10.5(L)  Hematocrit 34.8 - 46.6 % 34.3(L) 34.3(L) 34.1(L)  Platelets 145 - 400 10e3/uL 79(L) 77(L) 117(L)     . CMP Latest Ref Rng & Units 02/09/2017 01/12/2017 12/15/2016  Glucose 70 - 140 mg/dl 91 105 96  BUN 7.0 - 26.0 mg/dL 19.0 15.2 14.4  Creatinine 0.6 - 1.1 mg/dL 0.8 0.8 0.8  Sodium 136 - 145 mEq/L 136 135(L) 133(L)  Potassium 3.5 - 5.1 mEq/L 3.9 4.1 4.2  Chloride 98 - 110 mmol/L - - -  CO2 22 - 29 mEq/L 25 25 24   Calcium 8.4 - 10.4 mg/dL 9.6 9.5 9.3  Total Protein 6.4 - 8.3 g/dL 6.5  6.5 6.6  Total Bilirubin 0.20 - 1.20 mg/dL 0.84 0.56 0.72  Alkaline Phos 40 - 150 U/L 67 57 76  AST 5 - 34 U/L 20 22 22   ALT 0 - 55 U/L 13 15 21       RADIOGRAPHIC STUDIES: I have personally reviewed the radiological images as listed and agreed with the findings in the report.  CT ABDOMEN AND PELVIS WITHOUT CONTRAST   IMPRESSION: 1. No acute abnormalities within the abdomen or pelvis. 2. Mild splenomegaly and several prominent to mildly enlarged pelvic and inguinal lymph nodes. These findings are similar to the prior CT. 3. Small nonobstructing stone in the lower pole the right kidney. No ureteral stones or obstructive uropathy.   Electronically Signed   By: Lajean Manes M.D.   On: 11/14/2016 16:39    ASSESSMENT & PLAN:   71 yo caucasian female with   1.Rai Stage 2 CLL with lymphocytosis,  LNadenopathy and mild splenomegaly with hypogammaglobulinemia: CLL initially diagnosed 2009 with 13q deletion.. Only intervention thus far has been intermittent IVIG, clinically very helpful with decrease in respiratory infections.  Last imaging was CT AP 02-2015. --showed no evidence of Splenomegaly. Negative Hep B serology 2015   Lymphocyte count had increased to 136k now back down to 69k No overt new constitutional symptoms except mild unchanged fatigue. LDH WNL  2. Mild Anemia Hgb hemoglobin stable at 11 3. Moderate thrombocytopenia PLT  PLT count today 79k . Even of Abx. No overt platelet clumping noted today.  4. 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 Has been taking good infection prevention precautions. Has had significant recurrent headaches with IVIG - now held. 5. 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 No issues with infection-   her 13q deletion places her at risk for recurrent SCC. 6. Diarrhea -- this has been a significant and new issue. She has been following with Dr Hilarie Fredrickson and has had an extensive workup without a clear etiology. Random  colonic biopsies - showed no overt lymphocytic infiltrate Her diarrhea has nearly resolved with the use of rifaximin. PLAN -we discussed her current lab findings -will continue to hold IVIG  Due to significant meningimus like headaches. -she still have thrombocytopenia that is stable. We discussed that with her thrombocytopenia we would offer her the option of consider treatment for [name]. -if her thrombocytopenia got significantly worse that might limit some chemotherapy based treatment options. -I recommended started on Ibrutinib (she was given information and wanted to think about this) which is quite reasonable. -continue close f/u with dermatologist for evaluation and management of non melanoma skin cancers that can be increased in patient with CLL with 13q deletion.  RTC with Dr Irene Limbo in 4 weeks with rpt labs. Earlier If any other acute concerns.  I spent 20 minutes counseling the patient face to face. The total time spent in the appointment was 20 minutes and more than 50% was on counseling and direct patient cares.    Sullivan Lone MD [place] AAHIVMS [facility] Hematology/Oncology Physician [facility]  (Office):       [phone] (Work cell):  [phone] (Fax):           [phone]

## 2017-02-15 ENCOUNTER — Other Ambulatory Visit: Payer: Self-pay | Admitting: *Deleted

## 2017-02-15 DIAGNOSIS — C9110 Chronic lymphocytic leukemia of B-cell type not having achieved remission: Secondary | ICD-10-CM

## 2017-02-16 ENCOUNTER — Telehealth: Payer: Self-pay | Admitting: *Deleted

## 2017-02-16 NOTE — Telephone Encounter (Signed)
Pt lvm requesting change of apt from 4/12 to week of 3/26.  LVM with pt stating that would be fine.  Message sent to schedulers to contact pt to change apt.

## 2017-02-17 ENCOUNTER — Telehealth: Payer: Self-pay | Admitting: Hematology

## 2017-02-17 NOTE — Telephone Encounter (Signed)
Scheduled Lab appt per sch msg from [facility]. Called patient to confirm and they didn't answer. Left message with time and date of lab appt.

## 2017-02-17 NOTE — Telephone Encounter (Signed)
Schedule change per 3/16 sch msg from [facility]. Called and left message about schedule change.

## 2017-02-17 NOTE — Telephone Encounter (Signed)
Patient called and said that Dr Grier Mitts nurse called and said that she should be receiving a call from scheduling to schedule an appointment for the week of 3/26.  Her supervisor is leaving today and will not be back for a week.  She needs to get this time off approved before he leaves. Please call patient ASAP [phone]

## 2017-02-20 ENCOUNTER — Ambulatory Visit (HOSPITAL_BASED_OUTPATIENT_CLINIC_OR_DEPARTMENT_OTHER): Payer: 59

## 2017-02-20 DIAGNOSIS — C9110 Chronic lymphocytic leukemia of B-cell type not having achieved remission: Secondary | ICD-10-CM | POA: Diagnosis not present

## 2017-02-20 LAB — CBC WITH DIFFERENTIAL/PLATELET
BASO%: 0.1 % (ref 0.0–2.0)
BASOS ABS: 0.2 10*3/uL — AB (ref 0.0–0.1)
EOS ABS: 0.3 10*3/uL (ref 0.0–0.5)
EOS%: 0.3 % (ref 0.0–7.0)
HEMATOCRIT: 34.6 % — AB (ref 34.8–46.6)
HEMOGLOBIN: 11 g/dL — AB (ref 11.6–15.9)
LYMPH#: 97.1 10*3/uL — AB (ref 0.9–3.3)
LYMPH%: 91.3 % — ABNORMAL HIGH (ref 14.0–49.7)
MCH: 30.6 pg (ref 25.1–34.0)
MCHC: 31.9 g/dL (ref 31.5–36.0)
MCV: 95.9 fL (ref 79.5–101.0)
MONO#: 1.7 10*3/uL — ABNORMAL HIGH (ref 0.1–0.9)
MONO%: 1.6 % (ref 0.0–14.0)
NEUT%: 6.7 % — ABNORMAL LOW (ref 38.4–76.8)
NEUTROS ABS: 7.1 10*3/uL — AB (ref 1.5–6.5)
Platelets: 108 10*3/uL — ABNORMAL LOW (ref 145–400)
RBC: 3.61 10*6/uL — ABNORMAL LOW (ref 3.70–5.45)
RDW: 16 % — ABNORMAL HIGH (ref 11.2–14.5)
WBC: 106.4 10*3/uL (ref 3.9–10.3)

## 2017-02-20 LAB — COMPREHENSIVE METABOLIC PANEL
ALBUMIN: 4.1 g/dL (ref 3.5–5.0)
ALK PHOS: 66 U/L (ref 40–150)
ALT: 18 U/L (ref 0–55)
AST: 23 U/L (ref 5–34)
Anion Gap: 11 mEq/L (ref 3–11)
BILIRUBIN TOTAL: 0.7 mg/dL (ref 0.20–1.20)
BUN: 17.6 mg/dL (ref 7.0–26.0)
CALCIUM: 9.3 mg/dL (ref 8.4–10.4)
CO2: 24 mEq/L (ref 22–29)
CREATININE: 0.8 mg/dL (ref 0.6–1.1)
Chloride: 99 mEq/L (ref 98–109)
EGFR: 73 mL/min/{1.73_m2} — ABNORMAL LOW (ref >90)
Glucose: 100 mg/dl (ref 70–140)
Potassium: 4 mEq/L (ref 3.5–5.1)
Sodium: 135 mEq/L — ABNORMAL LOW (ref 136–145)
TOTAL PROTEIN: 6.5 g/dL (ref 6.4–8.3)

## 2017-02-20 LAB — TECHNOLOGIST REVIEW

## 2017-02-21 ENCOUNTER — Other Ambulatory Visit: Payer: 59

## 2017-02-23 ENCOUNTER — Telehealth: Payer: Self-pay | Admitting: *Deleted

## 2017-02-23 NOTE — Telephone Encounter (Signed)
-----   Message from Brunetta Genera, MD sent at 02/22/2017 10:49 PM EDT ----- Regarding: RE: Ibrutinib You could let her know that her plt on 3/19 were up to 108k  From 77k so she could just keep her appointment as scheduled for 03/16/2017. We will discuss about ibrutinib again then. thx GK  ----- Message ----- From: Arty Baumgartner, RN Sent: 02/17/2017   8:36 AM To: Ronnette Juniper, RN, # Subject: Ibrutinib                                      Dr. Irene Limbo,   Mrs. Lona has called requesting to be started on Ibrutinib as discussed.  She has also asked for a lab apt early next week as well as moving her apt with you up from 4/12 to the week of the 3/26.  I have sent messages to the schedulers.  Please let me know if you order the ibrutinib and I will let her know.    Thanks,  [employer]

## 2017-02-23 NOTE — Telephone Encounter (Signed)
Called patient per staff message, no answer. Left voicemail to call back.

## 2017-02-27 ENCOUNTER — Ambulatory Visit (INDEPENDENT_AMBULATORY_CARE_PROVIDER_SITE_OTHER): Payer: 59 | Admitting: Internal Medicine

## 2017-02-27 ENCOUNTER — Encounter: Payer: Self-pay | Admitting: Internal Medicine

## 2017-02-27 VITALS — BP 150/78 | HR 60 | Temp 97.6°F | Resp 16 | Ht 62.25 in | Wt 210.8 lb

## 2017-02-27 DIAGNOSIS — E559 Vitamin D deficiency, unspecified: Secondary | ICD-10-CM

## 2017-02-27 DIAGNOSIS — I10 Essential (primary) hypertension: Secondary | ICD-10-CM | POA: Diagnosis not present

## 2017-02-27 DIAGNOSIS — Z79899 Other long term (current) drug therapy: Secondary | ICD-10-CM

## 2017-02-27 DIAGNOSIS — C9110 Chronic lymphocytic leukemia of B-cell type not having achieved remission: Secondary | ICD-10-CM | POA: Diagnosis not present

## 2017-02-27 DIAGNOSIS — K219 Gastro-esophageal reflux disease without esophagitis: Secondary | ICD-10-CM

## 2017-02-27 DIAGNOSIS — R7303 Prediabetes: Secondary | ICD-10-CM

## 2017-02-27 DIAGNOSIS — E782 Mixed hyperlipidemia: Secondary | ICD-10-CM

## 2017-02-27 LAB — LIPID PANEL
CHOL/HDL RATIO: 4.1 ratio (ref <5.0)
CHOLESTEROL: 191 mg/dL (ref <200)
HDL: 47 mg/dL — ABNORMAL LOW (ref >50)
LDL CALC: 115 mg/dL — AB (ref <100)
TRIGLYCERIDES: 146 mg/dL (ref <150)
VLDL: 29 mg/dL (ref <30)

## 2017-02-27 LAB — CBC WITH DIFFERENTIAL/PLATELET
BASOS ABS: 0 {cells}/uL (ref 0–200)
Basophils Relative: 0 %
EOS ABS: 0 {cells}/uL — AB (ref 15–500)
Eosinophils Relative: 0 %
HEMATOCRIT: 34.1 % — AB (ref 35.0–45.0)
Hemoglobin: 10.9 g/dL — ABNORMAL LOW (ref 11.7–15.5)
LYMPHS PCT: 94 %
Lymphs Abs: 91932 cells/uL — ABNORMAL HIGH (ref 850–3900)
MCH: 30.1 pg (ref 27.0–33.0)
MCHC: 32 g/dL (ref 32.0–36.0)
MCV: 94.2 fL (ref 80.0–100.0)
MONO ABS: 978 {cells}/uL — AB (ref 200–950)
MONOS PCT: 1 %
MPV: 8.9 fL (ref 7.5–12.5)
NEUTROS PCT: 5 %
Neutro Abs: 4890 cells/uL (ref 1500–7800)
PLATELETS: 95 10*3/uL — AB (ref 140–400)
RBC: 3.62 MIL/uL — ABNORMAL LOW (ref 3.80–5.10)
RDW: 16 % — AB (ref 11.0–15.0)
WBC: 97.8 10*3/uL — ABNORMAL HIGH (ref 3.8–10.8)

## 2017-02-27 LAB — TSH: TSH: 4.88 mIU/L — ABNORMAL HIGH

## 2017-02-27 NOTE — Progress Notes (Signed)
This very nice 71 y.o. MWF presents for 6 month follow up with Hypertension, Hyperlipidemia, Pre-Diabetes, GERD  and Vitamin D Deficiency. Patient is followed by Dr Irene Limbo for CLL originally dx'd in 2013.     Patient is treated for HTN (1998) & BP has been controlled at home. Today's BP is elevated at 150/78 dropping to 142/74 on recheck.   Patient has had no complaints of any cardiac type chest pain, palpitations, dyspnea/orthopnea/PND, dizziness, claudication, or dependent edema.     Hyperlipidemia is controlled with diet & meds. Patient denies myalgias or other med SE's. Last Lipids were not at goal: Lab Results  Component Value Date   CHOL 190 11/24/2016   HDL 48 (L) 11/24/2016   LDLCALC 111 (H) 11/24/2016   TRIG 153 (H) 11/24/2016   CHOLHDL 4.0 11/24/2016      Also, the patient has history of Morbid Obesity (BMI 38+) and PreDiabetes (A1c 6.0% in 2012) and has had no symptoms of reactive hypoglycemia, diabetic polys, paresthesias or visual blurring.  Last A1c was at goal: Lab Results  Component Value Date   HGBA1C 5.0 11/24/2016      Further, the patient also has history of Vitamin D Deficiency ("30" in 2008) and supplements vitamin D without any suspected side-effects. Last vitamin D was still low: Lab Results  Component Value Date   VD25OH 38 05/30/2016   Current Outpatient Prescriptions on File Prior to Visit  Medication Sig  . ALPRAZolam  1 MG  TAKE 1/2-1 TAB 3 x / DAY AS NEEDED  . bisoprolol-hctz 10-6.25  take 1 tab once daily  . escitalopram  20 MG  Take 1/2 tab daily.  [name] Kitchen Fexofenadine 180 MG  Take 1 tab daily  . FLORASTOR 250 MG  Take 250 mg daily.   . Nasocort  daily as needed  . Sustain Ultra Eye drops   . pantoprazole40 MG  Take 1 tab in the morning daily   Allergies  Allergen Reactions  . Hyzaar [Losartan Potassium-Hctz] "messed up my sodium counts" swells up lips.  [name] [Pseudoephedrine Hcl] Palpitations  . Biaxin [Clarithromycin] GI Upset  . Celexa  [Citalopram]   . Flexeril [Cyclobenzaprine] "zombie-like" feeling  . Fosamax [Alendronate Sodium] GI upset      . Iohexol Hives  . Losartan Angioedema  . Meloxicam     GI upset  . Norvasc [Amlodipine] Swelling  . Pseudoephedrine     Palpitations  . Zoloft [Sertraline Hcl]    PMHx:   Past Medical History:  Diagnosis Date  . Anxiety   . Benign essential HTN 11/22/2011  . CLL (chronic lymphocytic leukemia) ([place])   . Depression   . GERD (gastroesophageal reflux disease)   . 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 High cholesterol   . History of bronchitis 08/02/2012   "maybe once/yr if that much; last time 01/2010"  . 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 OSA (obstructive sleep apnea)   . Pneumonia 01/2010  . Prediabetes   . Recurrent sinus infections 08/02/2012   Immunization History  Administered Date(s) Administered  . Influenza,inj,Quad PF,36+ Mos 09/15/2014, 08/13/2015  . Influenza-Unspecified 08/31/2013  . Pneumococcal Conjugate-13 09/23/2015  . Pneumococcal-Unspecified 12/28/2010  . Td 07/05/2006   Past Surgical History:  Procedure Laterality Date  . ABDOMINAL HYSTERECTOMY  1980's   "endometrosis"  . BREAST SURGERY    . CHOLECYSTECTOMY  1985  . FUNCTIONAL ENDOSCOPIC SINUS SURGERY  1990's   "cause I kept having sinus  infections"  . immunoglobulin treatment  2017  . LAPAROSCOPIC APPENDECTOMY N/A 06/04/2014   Procedure: APPENDECTOMY LAPAROSCOPIC;  Surgeon: Zenovia Jarred, MD;  Location: [place];  Service: General;  Laterality: N/A;  . LYMPH NODE BIOPSY     "determined I had CLL"  . TEMPOROMANDIBULAR JOINT ARTHROPLASTY  1980's  . TONSILLECTOMY AND ADENOIDECTOMY  1950's   FHx:    Reviewed / unchanged  SHx:    Reviewed / unchanged  Systems Review:  Constitutional: Denies fever, chills, wt changes, headaches, insomnia, fatigue, night sweats, change in appetite. Eyes: Denies redness, blurred vision, diplopia, discharge, itchy, watery eyes.    ENT: Denies discharge, congestion, post nasal drip, epistaxis, sore throat, earache, hearing loss, dental pain, tinnitus, vertigo, sinus pain, snoring.  CV: Denies chest pain, palpitations, irregular heartbeat, syncope, dyspnea, diaphoresis, orthopnea, PND, claudication or edema. Respiratory: denies cough, dyspnea, DOE, pleurisy, hoarseness, laryngitis, wheezing.  Gastrointestinal: Denies dysphagia, odynophagia, heartburn, reflux, water brash, abdominal pain or cramps, nausea, vomiting, bloating, diarrhea, constipation, hematemesis, melena, hematochezia  or hemorrhoids. Genitourinary: Denies dysuria, frequency, urgency, nocturia, hesitancy, discharge, hematuria or flank pain. Musculoskeletal: Denies arthralgias, myalgias, stiffness, jt. swelling, pain, limping or strain/sprain.  Skin: Denies pruritus, rash, hives, warts, acne, eczema or change in skin lesion(s). Neuro: No weakness, tremor, incoordination, spasms, paresthesia or pain. Psychiatric: Denies confusion, memory loss or sensory loss. Endo: Denies change in weight, skin or hair change.  Heme/Lymph: No excessive bleeding, bruising or enlarged lymph nodes.  Physical Exam  BP  150/78-> 142/74   P 60   T 97.6 F    R 16   Ht 5' 2.25"    Wt 210 lb 12.8 oz    BMI 38.25  Appears well nourished and in no distress.  Eyes: PERRLA, EOMs, conjunctiva no swelling or erythema. Sinuses: No frontal/maxillary tenderness ENT/Mouth: EAC's clear, TM's nl w/o erythema, bulging. Nares clear w/o erythema, swelling, exudates. Oropharynx clear without erythema or exudates. Oral hygiene is good. Tongue normal, non obstructing. Hearing intact.  Neck: Supple. Thyroid nl. Car 2+/2+ without bruits, nodes or JVD. Chest: Respirations nl with BS clear & equal w/o rales, rhonchi, wheezing or stridor.  Cor: Heart sounds normal w/ regular rate and rhythm without sig. murmurs, gallops, clicks, or rubs. Peripheral pulses normal and equal  without edema.  Abdomen:  Soft & bowel sounds normal. Non-tender w/o guarding, rebound, hernias, masses, or organomegaly.  Lymphatics: Unremarkable.  Musculoskeletal: Full ROM all peripheral extremities, joint stability, 5/5 strength, and normal gait.  Skin: Warm, dry without exposed rashes, lesions or ecchymosis apparent.  Neuro: Cranial nerves intact, reflexes equal bilaterally. Sensory-motor testing grossly intact. Tendon reflexes grossly intact.  Pysch: Alert & oriented x 3.  Insight and judgement nl & appropriate. No ideations.  Assessment and Plan:  1. Essential hypertension  - Continue medication, monitor blood pressure at home.  - Continue DASH diet. Reminder to go to the ER if any CP,  SOB, nausea, dizziness, severe HA, changes vision/speech,  left arm numbness and tingling and jaw pain.  - Magnesium - TSH - CBC with Differential/Platelet  2. Mixed hyperlipidemia  - Continue diet/meds, exercise,& lifestyle modifications.  - Continue monitor periodic cholesterol/liver & renal functions   - Lipid panel - TSH  3. Prediabetes  - Continue diet, exercise, lifestyle modifications.  - Monitor appropriate labs.  - Hemoglobin A1c - Insulin, random  4. Vitamin D deficiency  - Continue supplementation.  - VITAMIN D 25 Hydroxy   5. Gastroesophageal reflux disease   6. CLL (  chronic lymphocytic leukemia) ([facility])  - CBC with Differential/Platelet  7. Medication management  - Magnesium - Lipid panel - TSH - Hemoglobin A1c - Insulin, random - VITAMIN D 25 Hydroxy  - CBC with Differential/Platelet       Recommended regular exercise, BP monitoring, weight control, and discussed med and SE's. Recommended labs to assess and monitor clinical status. Further disposition pending results of labs. Over 30 minutes of exam, counseling, chart review was performed

## 2017-02-27 NOTE — Patient Instructions (Signed)

## 2017-02-28 ENCOUNTER — Other Ambulatory Visit: Payer: Self-pay | Admitting: Internal Medicine

## 2017-02-28 ENCOUNTER — Other Ambulatory Visit: Payer: Self-pay | Admitting: *Deleted

## 2017-02-28 ENCOUNTER — Ambulatory Visit: Payer: 59 | Admitting: Hematology

## 2017-02-28 ENCOUNTER — Encounter: Payer: Self-pay | Admitting: *Deleted

## 2017-02-28 DIAGNOSIS — E039 Hypothyroidism, unspecified: Secondary | ICD-10-CM

## 2017-02-28 DIAGNOSIS — E782 Mixed hyperlipidemia: Secondary | ICD-10-CM

## 2017-02-28 LAB — HEMOGLOBIN A1C
Hgb A1c MFr Bld: 5.1 % (ref <5.7)
MEAN PLASMA GLUCOSE: 100 mg/dL

## 2017-02-28 LAB — VITAMIN D 25 HYDROXY (VIT D DEFICIENCY, FRACTURES): VIT D 25 HYDROXY: 51 ng/mL (ref 30–100)

## 2017-02-28 LAB — MAGNESIUM: Magnesium: 1.6 mg/dL (ref 1.5–2.5)

## 2017-02-28 LAB — INSULIN, RANDOM: INSULIN: 11.4 u[IU]/mL (ref 2.0–19.6)

## 2017-02-28 MED ORDER — ROSUVASTATIN CALCIUM 40 MG PO TABS: ORAL_TABLET | ORAL | 5 refills | Status: DC

## 2017-03-14 ENCOUNTER — Telehealth: Payer: Self-pay | Admitting: Internal Medicine

## 2017-03-14 NOTE — Telephone Encounter (Signed)
Patient complaining of bilateral leg swelling for 10 days. Right leg worst, also cramping. Please advise if any reccomendations before Thursday 03-16-17 appointment.

## 2017-03-15 ENCOUNTER — Telehealth: Payer: Self-pay | Admitting: Hematology

## 2017-03-15 NOTE — Telephone Encounter (Signed)
Call day - moved 4/16 lab/fu to 4/23. Left message for patient and mailed schedule.

## 2017-03-16 ENCOUNTER — Encounter: Payer: Self-pay | Admitting: Internal Medicine

## 2017-03-16 ENCOUNTER — Other Ambulatory Visit: Payer: 59

## 2017-03-16 ENCOUNTER — Ambulatory Visit: Payer: 59 | Admitting: Hematology

## 2017-03-16 ENCOUNTER — Ambulatory Visit (INDEPENDENT_AMBULATORY_CARE_PROVIDER_SITE_OTHER): Payer: 59 | Admitting: Internal Medicine

## 2017-03-16 VITALS — BP 170/82 | HR 72 | Temp 97.3°F | Resp 16 | Ht 62.25 in | Wt 214.0 lb

## 2017-03-16 DIAGNOSIS — I10 Essential (primary) hypertension: Secondary | ICD-10-CM

## 2017-03-16 DIAGNOSIS — R300 Dysuria: Secondary | ICD-10-CM

## 2017-03-16 DIAGNOSIS — R609 Edema, unspecified: Secondary | ICD-10-CM

## 2017-03-16 DIAGNOSIS — E782 Mixed hyperlipidemia: Secondary | ICD-10-CM

## 2017-03-16 MED ORDER — HYDROCHLOROTHIAZIDE 12.5 MG PO TABS: ORAL_TABLET | ORAL | 1 refills | Status: DC

## 2017-03-17 LAB — URINALYSIS, ROUTINE W REFLEX MICROSCOPIC
Bilirubin Urine: NEGATIVE
Glucose, UA: NEGATIVE
KETONES UR: NEGATIVE
LEUKOCYTES UA: NEGATIVE
NITRITE: NEGATIVE
Protein, ur: NEGATIVE
SPECIFIC GRAVITY, URINE: 1.007 (ref 1.001–1.035)
pH: 6 (ref 5.0–8.0)

## 2017-03-17 LAB — URINALYSIS, MICROSCOPIC ONLY
Bacteria, UA: NONE SEEN [HPF]
CRYSTALS: NONE SEEN [HPF]
Casts: NONE SEEN [LPF]
RBC / HPF: NONE SEEN RBC/HPF (ref <=2)
Squamous Epithelial / LPF: NONE SEEN [HPF] (ref <=5)
WBC UA: NONE SEEN WBC/HPF (ref <=5)
Yeast: NONE SEEN [HPF]

## 2017-03-17 NOTE — Progress Notes (Signed)
Subjective:    Patient ID: Rebecca Bond, female    DOB: 1946/02/21, 71 y.o.   MRN: [id number]  HPI    This nice 71 yo MWF with HTN, HLD, Morbid Obesity (BMI 38+), PreDM, GERD, Vit D Def and CLL was seen recently on 02/27/17 and stable - doing well.  She did have Minoxidil 2.5 mg added to her regimen in Dec. In the interim she has been seen by an Orthopedist and place on a prednisone dose taper and now reports she's had increased dependent LE edema as well as elevated BP. She also is c/o increased urinary frequency and burning.  Medication Sig  . ALPRAZolam (XANAX) 1 MG tablet TAKE 1/2 TO 1 TABLET 3 TIMES A DAY AS NEEDED FOR ANXIETY  . bisoprolol-hydrochlorothiazide (ZIAC) 10-6.25 MG tablet take 1 tablet by mouth once daily  . dicyclomine (BENTYL) 10 MG capsule TAKE 1 CAPSULE THREE TIMES A DAY AS NEEDED FOR ABDOMINAL PAIN AND CRAMPING  . escitalopram (LEXAPRO) 20 MG tablet Take 1 tablet (20 mg total) by mouth daily. (Patient taking differently: Take 10 mg by mouth daily. )  . fexofenadine (ALLEGRA) 180 MG tablet Take 1 tablet (180 mg total) by mouth daily. Take PRN for allergies.  [name] Kitchen FLORASTOR 250 MG capsule Take 250 mg by mouth daily.   . minoxidil (LONITEN) 2.5 MG tablet Take 2.5 mg by mouth daily.  [name] Kitchen OVER THE COUNTER MEDICATION Nasocort daily as needed  . OVER THE COUNTER MEDICATION Sustain Ultra Eye drops  . pantoprazole (PROTONIX) 40 MG tablet Take 1 tab in the morning daily. (Patient taking differently: as needed. Take 1 tab in the morning daily.)  . ondansetron (ZOFRAN) 4 MG tablet TAKE 1 TABLET BY MOUTH DAILY AS NEEDED FOR NAUSEA OR VOMITING  . WELCHOL 3.75 g PACK TK 1 PACKET PO QD WITH 8 OUNCES OF LIQUID FOR DH   Allergies  Allergen Reactions  . Hyzaar [Losartan Potassium-Hctz] Other (See Comments)    "messed up my sodium counts" swells up lips.  [name] [Pseudoephedrine Hcl] Palpitations  . Biaxin [Clarithromycin]     GI Upset  . Celexa [Citalopram]   . Flexeril  [Cyclobenzaprine]     "zombie-like" feeling  . Fosamax [Alendronate Sodium]     GI upset  . Iohexol Hives     Code: HIVES, Desc: pt states she broke out in hive 20 yrs ago from IV contrast.     . Losartan     Angioedema  . Meloxicam     GI upset  . Norvasc [Amlodipine] Swelling  . Pseudoephedrine     Palpitations  . Zoloft [Sertraline Hcl]    Past Medical History:  Diagnosis Date  . CLL (chronic lymphocytic leukemia) ([place])   . GERD (gastroesophageal reflux disease)   . OSA (obstructive sleep apnea)   . Prediabetes   . Recurrent sinus infections 08/02/2012   Past Surgical History:  Procedure Laterality Date  . ABDOMINAL HYSTERECTOMY  1980's   "endometrosis"  . BREAST SURGERY    . CHOLECYSTECTOMY  1985  . FUNCTIONAL ENDOSCOPIC SINUS SURGERY  1990's   "cause I kept having sinus infections"  . immunoglobulin treatment  2017  . LAPAROSCOPIC APPENDECTOMY N/A 06/04/2014   Procedure: APPENDECTOMY LAPAROSCOPIC;  Surgeon: Zenovia Jarred, MD;  Location: [place];  Service: General;  Laterality: N/A;  . LYMPH NODE BIOPSY     "determined I had CLL"  . TEMPOROMANDIBULAR JOINT ARTHROPLASTY  1980's  . TONSILLECTOMY AND ADENOIDECTOMY  1950's   Review  of Systems  10 point systems review negative except as above.    Objective:   Physical Exam  BP 170/82-> 166/85   P 72   T 97.3 F    R 16   Ht 5' 2.25"    Wt 214 lb  BMI 38.83  HEENT - Eac's patent. TM's Nl. EOM's full. PERRLA. NasoOroPharynx clear. Neck - supple. Nl Thyroid. Carotids 2+ & No bruits, nodes, JVD Chest - Clear equal BS w/o Rales, rhonchi, wheezes. Cor - Nl HS. RRR w/o sig MGR. PP 1(+). 1(+) pretibial/ankle edema. MS- FROM w/o deformities. Muscle power, tone and bulk Nl. Gait Nl. Neuro -  Nl w/o focal abnormalities.    Assessment & Plan:   1. Essential hypertension  - advised monitor BP's 2 x/da and call if remain elevated.   2. Edema  - hydrochlorothiazide 12.5 MG tablet; Take 1 tablet daily for BP and  fluid/swelling  Dispense: 90 tablet; Refill: 1  3. Dysuria  - Urinalysis, Routine w reflex microscopic - Urine culture

## 2017-03-18 LAB — URINE CULTURE: Organism ID, Bacteria: NO GROWTH

## 2017-03-20 ENCOUNTER — Other Ambulatory Visit: Payer: 59

## 2017-03-20 ENCOUNTER — Ambulatory Visit: Payer: 59 | Admitting: Hematology

## 2017-03-22 ENCOUNTER — Other Ambulatory Visit: Payer: Self-pay | Admitting: Physician Assistant

## 2017-03-22 ENCOUNTER — Other Ambulatory Visit: Payer: Self-pay | Admitting: Internal Medicine

## 2017-03-22 DIAGNOSIS — F411 Generalized anxiety disorder: Secondary | ICD-10-CM

## 2017-03-22 MED ORDER — ESCITALOPRAM OXALATE 10 MG PO TABS: ORAL_TABLET | ORAL | 1 refills | Status: DC

## 2017-03-22 NOTE — Telephone Encounter (Signed)
Please call Alprazolam 

## 2017-03-24 ENCOUNTER — Other Ambulatory Visit: Payer: Self-pay | Admitting: Sports Medicine

## 2017-03-24 DIAGNOSIS — M545 Low back pain: Secondary | ICD-10-CM

## 2017-03-27 ENCOUNTER — Ambulatory Visit (HOSPITAL_BASED_OUTPATIENT_CLINIC_OR_DEPARTMENT_OTHER): Payer: 59 | Admitting: Hematology

## 2017-03-27 ENCOUNTER — Other Ambulatory Visit (HOSPITAL_BASED_OUTPATIENT_CLINIC_OR_DEPARTMENT_OTHER): Payer: 59

## 2017-03-27 ENCOUNTER — Telehealth: Payer: Self-pay | Admitting: Hematology

## 2017-03-27 ENCOUNTER — Encounter: Payer: Self-pay | Admitting: Hematology

## 2017-03-27 VITALS — BP 158/64 | HR 72 | Temp 98.2°F | Resp 18 | Wt 218.0 lb

## 2017-03-27 DIAGNOSIS — D649 Anemia, unspecified: Secondary | ICD-10-CM | POA: Diagnosis not present

## 2017-03-27 DIAGNOSIS — D801 Nonfamilial hypogammaglobulinemia: Secondary | ICD-10-CM | POA: Diagnosis not present

## 2017-03-27 DIAGNOSIS — R197 Diarrhea, unspecified: Secondary | ICD-10-CM | POA: Diagnosis not present

## 2017-03-27 DIAGNOSIS — C9110 Chronic lymphocytic leukemia of B-cell type not having achieved remission: Secondary | ICD-10-CM

## 2017-03-27 DIAGNOSIS — E039 Hypothyroidism, unspecified: Secondary | ICD-10-CM

## 2017-03-27 DIAGNOSIS — D696 Thrombocytopenia, unspecified: Secondary | ICD-10-CM | POA: Diagnosis not present

## 2017-03-27 DIAGNOSIS — C9190 Lymphoid leukemia, unspecified not having achieved remission: Secondary | ICD-10-CM | POA: Diagnosis not present

## 2017-03-27 LAB — CBC & DIFF AND RETIC
BASO%: 0.5 % (ref 0.0–2.0)
Basophils Absolute: 1.2 10*3/uL — ABNORMAL HIGH (ref 0.0–0.1)
EOS%: 0 % (ref 0.0–7.0)
Eosinophils Absolute: 0 10*3/uL (ref 0.0–0.5)
HCT: 35.1 % (ref 34.8–46.6)
HGB: 10.9 g/dL — ABNORMAL LOW (ref 11.6–15.9)
Immature Retic Fract: 7.4 % (ref 1.60–10.00)
LYMPH#: 208.7 10*3/uL — AB (ref 0.9–3.3)
LYMPH%: 93.9 % — ABNORMAL HIGH (ref 14.0–49.7)
MCH: 30.3 pg (ref 25.1–34.0)
MCHC: 31.1 g/dL — AB (ref 31.5–36.0)
MCV: 97.5 fL (ref 79.5–101.0)
MONO#: 4 10*3/uL — AB (ref 0.1–0.9)
MONO%: 1.8 % (ref 0.0–14.0)
NEUT%: 3.8 % — ABNORMAL LOW (ref 38.4–76.8)
NEUTROS ABS: 8.3 10*3/uL — AB (ref 1.5–6.5)
NRBC: 0 % (ref 0–0)
Platelets: 105 10*3/uL — ABNORMAL LOW (ref 145–400)
RBC: 3.6 10*6/uL — AB (ref 3.70–5.45)
RETIC %: 1.66 % (ref 0.70–2.10)
RETIC CT ABS: 59.76 10*3/uL (ref 33.70–90.70)
WBC: 222.1 10*3/uL — AB (ref 3.9–10.3)

## 2017-03-27 LAB — COMPREHENSIVE METABOLIC PANEL
ALT: 199 U/L — AB (ref 0–55)
AST: 246 U/L — AB (ref 5–34)
Albumin: 4.3 g/dL (ref 3.5–5.0)
Alkaline Phosphatase: 62 U/L (ref 40–150)
Anion Gap: 12 mEq/L — ABNORMAL HIGH (ref 3–11)
BUN: 30.1 mg/dL — AB (ref 7.0–26.0)
CHLORIDE: 93 meq/L — AB (ref 98–109)
CO2: 22 meq/L (ref 22–29)
CREATININE: 0.9 mg/dL (ref 0.6–1.1)
Calcium: 9.1 mg/dL (ref 8.4–10.4)
EGFR: 68 mL/min/{1.73_m2} — ABNORMAL LOW (ref >90)
Glucose: 119 mg/dl (ref 70–140)
POTASSIUM: 4.2 meq/L (ref 3.5–5.1)
SODIUM: 127 meq/L — AB (ref 136–145)
Total Bilirubin: 0.88 mg/dL (ref 0.20–1.20)
Total Protein: 6.6 g/dL (ref 6.4–8.3)

## 2017-03-27 LAB — TECHNOLOGIST REVIEW

## 2017-03-27 NOTE — Progress Notes (Signed)
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 (patient reports -planned for August 2017) 13q deletion does pre-dispose her to recurrent SCC  Current Treatment: observation  Previous treatment: IVIG for several months last winter to reduce recurrent respiratory infections (Patient notes that this helped) Has no required definitive treatment for CLL at this time  INTERVAL HISTORY:  Patient is here for her scheduled f/u for CLL. 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 She has received some steroid shots and has also been started on oral prednisone. She notes that she has been scheduled for an MRI of the back. Has developed some lower extremity swelling due to steroids and was started on diuretics by her primary care physician. No new lymph nodes. No new rashes. Her hemoglobin has remained stable at around 11 and her platelets have been somewhat better at 105k. Her WBC counts increase significantly at 222k we discussed that this would need to be monitored closely.   REVIEW OF SYSTEMS:    10 Point review of systems of done and is negative except as noted above.  . Past Medical History:  Diagnosis Date  . CLL (chronic lymphocytic leukemia) ([place])   . GERD (gastroesophageal reflux disease)   . OSA (obstructive sleep apnea)   . Prediabetes   . Recurrent sinus infections 08/02/2012    . Past Surgical History:  Procedure Laterality Date  . ABDOMINAL HYSTERECTOMY  1980's   "endometrosis"  . BREAST SURGERY    . CHOLECYSTECTOMY  1985  .  FUNCTIONAL ENDOSCOPIC SINUS SURGERY  1990's   "cause I kept having sinus infections"  . immunoglobulin treatment  2017  . LAPAROSCOPIC APPENDECTOMY N/A 06/04/2014   Procedure: APPENDECTOMY LAPAROSCOPIC;  Surgeon: Zenovia Jarred, MD;  Location: [facility];  Service: General;  Laterality: N/A;  . LYMPH NODE BIOPSY     "determined I had CLL"  . TEMPOROMANDIBULAR JOINT ARTHROPLASTY  1980's  . TONSILLECTOMY AND ADENOIDECTOMY  1950's    . Social History  Substance Use Topics  . Smoking status: Former Smoker    Packs/day: 0.75    Years: 4.00    Types: Cigarettes    Quit date: 12/05/1968  . Smokeless tobacco: Never Used  . Alcohol use 8.4 oz/week    14 Glasses of wine per week     Comment: 08/02/2012 "couple glasses wine q hs; last time 2-3 wk ago"    ALLERGIES:  is allergic to hyzaar [losartan potassium-hctz]; sudafed [pseudoephedrine hcl]; biaxin [clarithromycin]; celexa [citalopram]; flexeril [cyclobenzaprine]; fosamax [alendronate sodium]; iohexol; losartan; meloxicam; norvasc [amlodipine]; pseudoephedrine; and zoloft [sertraline hcl].  MEDICATIONS:  Current Outpatient Prescriptions  Medication Sig Dispense Refill  . ALPRAZolam (XANAX) 1 MG tablet TAKE 1/2 TO 1 TABLET BY MOUTH 3 TIMES A DAY AS NEEDED 90 tablet 0  . bisoprolol-hydrochlorothiazide (ZIAC) 10-6.25 MG tablet take 1 tablet by mouth once daily 90 tablet 1  . escitalopram (LEXAPRO) 10 MG tablet Take 1 tablet daily for Mood 90 tablet 1  . ezetimibe (ZETIA) 10 MG tablet   0  . fexofenadine (  ALLEGRA) 180 MG tablet Take 1 tablet (180 mg total) by mouth daily. Take PRN for allergies. 30 tablet 6  . FLORASTOR 250 MG capsule Take 250 mg by mouth daily.   1  . hydrochlorothiazide (HYDRODIURIL) 12.5 MG tablet Take 1 tablet daily for BP and fluid/swelling 90 tablet 1  . minoxidil (LONITEN) 2.5 MG tablet Take 2.5 mg by mouth daily.  0  . OVER THE COUNTER MEDICATION Nasocort daily as needed    . OVER THE COUNTER MEDICATION Sustain Ultra Eye  drops    . pantoprazole (PROTONIX) 40 MG tablet Take 1 tab in the morning daily. (Patient taking differently: as needed. Take 1 tab in the morning daily.) 90 tablet 3  . predniSONE (DELTASONE) 10 MG tablet 30 mg. 1 more day of 30. Then 20 mg. Then 10 mg (2days)     No current facility-administered medications for this visit.     PHYSICAL EXAMINATION: ECOG PERFORMANCE STATUS: 1 - Symptomatic but completely ambulatory  . Vitals:   03/27/17 1501  BP: (!) 158/64  Pulse: 72  Resp: 18  Temp: 98.2 F (36.8 C)    Filed Weights   03/27/17 1501  Weight: 218 lb (98.9 kg)   .Body mass index is 39.55 kg/m.  GENERAL:alert, in no acute distress and comfortable SKIN: no acute rashes , healing nose surgical wound. EYES: normal, conjunctiva are pink and non-injected, sclera clear OROPHARYNX:no exudate, no erythema and lips, buccal mucosa, and tongue normal  NECK: supple, no JVD, thyroid normal size, non-tender, without nodularity LYMPH:  no palpable lymphadenopathy in the cervical, axillary or inguinal LUNGS: clear to auscultation with normal respiratory effort HEART: regular rate & rhythm,  no murmurs and no lower extremity edema ABDOMEN: abdomen soft, non-tender, normoactive bowel sounds , no palpable hepatosplenomegaly Musculoskeletal: no cyanosis of digits and no clubbing  PSYCH: alert & oriented x 3 with fluent speech NEURO: no focal motor/sensory deficits  LABORATORY DATA:   . CBC Latest Ref Rng & Units 03/27/2017 02/27/2017 02/20/2017  WBC 3.9 - 10.3 10e3/uL 222.1(HH) 97.8(H) 106.4(HH)  Hemoglobin 11.6 - 15.9 g/dL 10.9(L) 10.9(L) 11.0(L)  Hematocrit 34.8 - 46.6 % 35.1 34.1(L) 34.6(L)  Platelets 145 - 400 10e3/uL 105(L) 95(L) 108(L)   . CBC    Component Value Date/Time   WBC 222.1 (HH) 03/27/2017 1443   WBC 97.8 (H) 02/27/2017 1720   RBC 3.60 (L) 03/27/2017 1443   RBC 3.62 (L) 02/27/2017 1720   HGB 10.9 (L) 03/27/2017 1443   HCT 35.1 03/27/2017 1443   PLT 105 (L)  03/27/2017 1443   MCV 97.5 03/27/2017 1443   MCH 30.3 03/27/2017 1443   MCH 30.1 02/27/2017 1720   MCHC 31.1 (L) 03/27/2017 1443   MCHC 32.0 02/27/2017 1720   RDW 16.0 (H) 02/27/2017 1720   RDW 16.0 (H) 02/20/2017 1558   LYMPHSABS 208.7 (H) 03/27/2017 1443   MONOABS 4.0 (H) 03/27/2017 1443   EOSABS 0.0 03/27/2017 1443   BASOSABS 1.2 (H) 03/27/2017 1443     . CMP Latest Ref Rng & Units 03/27/2017 02/20/2017 02/09/2017  Glucose 70 - 140 mg/dl 119 100 91  BUN 7.0 - 26.0 mg/dL 30.1(H) 17.6 19.0  Creatinine 0.6 - 1.1 mg/dL 0.9 0.8 0.8  Sodium 136 - 145 mEq/L 127(L) 135(L) 136  Potassium 3.5 - 5.1 mEq/L 4.2 4.0 3.9  Chloride 98 - 110 mmol/L - - -  CO2 22 - 29 mEq/L 22 24 25   Calcium 8.4 - 10.4 mg/dL 9.1 9.3 9.6  Total Protein  6.4 - 8.3 g/dL 6.6 6.5 6.5  Total Bilirubin 0.20 - 1.20 mg/dL 0.88 0.70 0.84  Alkaline Phos 40 - 150 U/L 62 66 67  AST 5 - 34 U/L 246(HH) 23 20  ALT 0 - 55 U/L 199(H) 18 13      RADIOGRAPHIC STUDIES: I have personally reviewed the radiological images as listed and agreed with the findings in the report.  CT ABDOMEN AND PELVIS WITHOUT CONTRAST   IMPRESSION: 1. No acute abnormalities within the abdomen or pelvis. 2. Mild splenomegaly and several prominent to mildly enlarged pelvic and inguinal lymph nodes. These findings are similar to the prior CT. 3. Small nonobstructing stone in the lower pole the right kidney. No ureteral stones or obstructive uropathy.   Electronically Signed   By: Lajean Manes M.D.   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 Only intervention thus far has been intermittent IVIG, clinically very helpful with decrease in respiratory infections.  Last imaging was CT AP 02-2015. --showed no evidence of Splenomegaly. Negative Hep B serology 2015     Lymphocyte counts have increased significantly from 92k to 208k in the setting of steroid use -- ? Redistribution of lymphocytes vs progression of CLL.  No overt new constitutional symptoms except mild unchanged fatigue.  2. Mild Anemia Hgb hemoglobin stable at around 11 3. Moderate thrombocytopenia PLT  PLT count today better and has improved from 79k to 105k likely from steroids. No overt platelet clumping noted today.  4. 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 Has been taking good infection prevention precautions. Has had significant recurrent headaches with IVIG - now held. 5. 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 No issues with infection-   her 13q deletion places her at risk for recurrent SCC. 6. Diarrhea -- this has been a significant and new issue. She has been following with Dr Hilarie Fredrickson and has had an extensive workup without a clear etiology. Random colonic biopsies - showed no overt lymphocytic infiltrate Her diarrhea has nearly resolved with the use of rifaximin. PLAN -we discussed her current lab findings -anemia and thrombocytopenia stable. -Patient has had a doubling of her lymphocytes and one month's which could be possibly due to redistribution in the setting of steroid use but will need to be monitored closely to rule out disease progression. -Will need to get repeat labs including CBC with differential and LDH -continue close f/u with dermatologist for evaluation and management of non melanoma skin cancers that can be increased in patient with CLL with 13q deletion.  Labs in 4 weeks RTC with Dr Irene Limbo in 8 weeks with labs  I spent 20 minutes counseling the patient face to face. The total time spent in the appointment was 25 minutes and more than 50% was on counseling and direct patient cares.    Sullivan Lone MD [place] AAHIVMS [facility] Hematology/Oncology Physician [facility]  (Office):       [phone] (Work cell):   [phone] (Fax):           [phone]

## 2017-03-27 NOTE — Telephone Encounter (Signed)
Gave patient AVS and calender per 4/23 los.  

## 2017-03-30 ENCOUNTER — Encounter: Payer: Self-pay | Admitting: Internal Medicine

## 2017-03-30 ENCOUNTER — Telehealth: Payer: Self-pay | Admitting: *Deleted

## 2017-03-30 ENCOUNTER — Ambulatory Visit (INDEPENDENT_AMBULATORY_CARE_PROVIDER_SITE_OTHER): Payer: 59 | Admitting: Internal Medicine

## 2017-03-30 ENCOUNTER — Other Ambulatory Visit: Payer: Self-pay

## 2017-03-30 VITALS — BP 146/76 | HR 76 | Temp 96.4°F | Resp 16 | Ht 62.25 in | Wt 215.0 lb

## 2017-03-30 DIAGNOSIS — Z79899 Other long term (current) drug therapy: Secondary | ICD-10-CM

## 2017-03-30 DIAGNOSIS — I10 Essential (primary) hypertension: Secondary | ICD-10-CM

## 2017-03-30 DIAGNOSIS — R609 Edema, unspecified: Secondary | ICD-10-CM | POA: Diagnosis not present

## 2017-03-30 DIAGNOSIS — R7989 Other specified abnormal findings of blood chemistry: Secondary | ICD-10-CM

## 2017-03-30 DIAGNOSIS — R945 Abnormal results of liver function studies: Secondary | ICD-10-CM | POA: Diagnosis not present

## 2017-03-30 LAB — BASIC METABOLIC PANEL WITH GFR
BUN: 20 mg/dL (ref 7–25)
CALCIUM: 9.4 mg/dL (ref 8.6–10.4)
CO2: 25 mmol/L (ref 20–31)
CREATININE: 0.84 mg/dL (ref 0.60–0.93)
Chloride: 93 mmol/L — ABNORMAL LOW (ref 98–110)
GFR, EST AFRICAN AMERICAN: 81 mL/min (ref >=60)
GFR, EST NON AFRICAN AMERICAN: 71 mL/min (ref >=60)
GLUCOSE: 88 mg/dL (ref 65–99)
Potassium: 4 mmol/L (ref 3.5–5.3)
Sodium: 130 mmol/L — ABNORMAL LOW (ref 135–146)

## 2017-03-30 LAB — HEPATIC FUNCTION PANEL
ALT: 158 U/L — ABNORMAL HIGH (ref 6–29)
AST: 72 U/L — ABNORMAL HIGH (ref 10–35)
Albumin: 4.1 g/dL (ref 3.6–5.1)
Alkaline Phosphatase: 51 U/L (ref 33–130)
BILIRUBIN TOTAL: 1 mg/dL (ref 0.2–1.2)
Bilirubin, Direct: 0.3 mg/dL — ABNORMAL HIGH (ref <=0.2)
Indirect Bilirubin: 0.7 mg/dL (ref 0.2–1.2)
Total Protein: 5.8 g/dL — ABNORMAL LOW (ref 6.1–8.1)

## 2017-03-30 NOTE — Patient Instructions (Signed)
Stop Hydrochlorothiazide for Now  pending lab results  Compression stockings and  leg elevation as discussed

## 2017-03-30 NOTE — Progress Notes (Signed)
Subjective:    Patient ID: Rebecca Bond, female    DOB: 1946/03/28, 71 y.o.   MRN: [id number]  HPI  This very nice 72 yo MWF with co-morbidities of HTN, pre_DM and CLL was recently added HCTZ on 03/16/17 for worsening symptomatic dependent edema. On 4/23 she had labs by Dr Irene Limbo showing elevated WBC 222K up from 97.8K and attributed to recent steroid treatments. Also, she had new elevation of LFT's with elevated AST from 23 to 246, ALT from 18 to 199 and with Nl Alk. P'tase and Bilirubin. Patient also had BUN rise from 17.6 to 30 and Na drop from 135 to 1267 during the same interim abd interestingly weight went up 1 #.   Medication Sig  . ALPRAZolam  1 MG tablet TAKE 1/2-1 TAB 3 TIMES A DAY AS NEEDED  . bisoprolol-hctz 10-6.25 take 1 tablet by mouth once daily  . escitalopram  10 MG tablet Take 1 tablet daily for Mood  . ezetimibe  10 MG tablet   . fexofenadine  180 MG tablet Take 1 tab daily  PRN for allergies.  [name] Kitchen FLORASTOR 250 MG capsule Take 250 mg by mouth daily.   . hctz 12.5 MG tablet Take 1 tablet daily for BP and fluid/swelling  . minoxidil  2.5 MG tablet Take 2.5 mg by mouth daily.  . Nasocort  daily as needed  . Sustain Ultra Eye drops   . pantoprazole  40 MG tablet Take 1 tab in the morning daily.)   Allergies  Allergen Reactions  . Hyzaar [Losartan Potassium-Hctz] Other (See Comments)    "messed up my sodium counts" swells up lips.  [name] [Pseudoephedrine Hcl] Palpitations  . Biaxin [Clarithromycin]     GI Upset  . Celexa [Citalopram]   . Flexeril [Cyclobenzaprine]     "zombie-like" feeling  . Fosamax [Alendronate Sodium]     GI upset  . Iohexol Hives     Code: HIVES, Desc: pt states she broke out in hive 20 yrs ago from IV contrast.     . Losartan     Angioedema  . Meloxicam     GI upset  . Norvasc [Amlodipine] Swelling  . Pseudoephedrine     Palpitations  . Zoloft [Sertraline Hcl]    Past Medical History:  Diagnosis Date  . CLL (chronic lymphocytic  leukemia) ([place])   . GERD (gastroesophageal reflux disease)   . OSA (obstructive sleep apnea)   . Prediabetes   . Recurrent sinus infections 08/02/2012   Past Surgical History:  Procedure Laterality Date  . ABDOMINAL HYSTERECTOMY  1980's   "endometrosis"  . BREAST SURGERY    . CHOLECYSTECTOMY  1985  . FUNCTIONAL ENDOSCOPIC SINUS SURGERY  1990's   "cause I kept having sinus infections"  . immunoglobulin treatment  2017  . LAPAROSCOPIC APPENDECTOMY N/A 06/04/2014   Procedure: APPENDECTOMY LAPAROSCOPIC;  Surgeon: Zenovia Jarred, MD;  Location: [place];  Service: General;  Laterality: N/A;  . LYMPH NODE BIOPSY     "determined I had CLL"  . TEMPOROMANDIBULAR JOINT ARTHROPLASTY  1980's  . TONSILLECTOMY AND ADENOIDECTOMY  1950's    Review of Systems  10 point systems review negative except as above.     Objective:   Physical Exam  BP (!) 146/76   Pulse 76   Temp (!) 96.4 F (35.8 C)   Resp 16   Ht 5' 2.25" (1.581 m)   Wt 215 lb (97.5 kg)   BMI 39.01 kg/m   HEENT -  WNL. Neck - Supple. Nl Thyroid. Carotids 2+ & No bruits, nodes, JVD Chest - Clear equal BS w/o Rales, rhonchi, wheezes. Cor - Nl HS. RRR w/o sig MGR. PP 1(+). (+)1  Ankle/pedal edema. Abd - No palpable organomegaly, masses or tenderness. BS nl. MS- FROM w/o deformities. Gait Nl. Neuro - Nl w/o focal abnormalities    Assessment & Plan:   1. Abnormal LFTs  - Gamma GT - Hepatic function panel  2. Essential hypertension  - BASIC METABOLIC PANEL WITH GFR  3. Edema   4. Medication management  - CBC with Differential/Platelet - Gamma GT - Hepatic function panel - BASIC METABOLIC PANEL WITH GFR  - Discussed with patient to repeat liver enzymes to establish a baseline and recommend to stop HCTZ and then repeat LFT's or consider further diagnostic studies for viral and other intrinsic liver dz's. Disposition pending labs.

## 2017-03-30 NOTE — Telephone Encounter (Signed)
-----   Message from Brunetta Genera, MD sent at 03/29/2017  6:11 PM EDT ----- Delle Reining, Plz let patient know that her CMP that resulted after the clinic visit showed significantly elevated ALT and AST from unclear etiology. COuld be from the prednisone that she has been receiving, or an alternative medicine or potentially from a viral infection. She also appears somewhat dehydrated. She need to have her CMP repeated with her PCP in 7-10 days to f/u on this and pursue further workup of her abnormal Liver function tests. Thanks [place]

## 2017-03-30 NOTE — Telephone Encounter (Signed)
Per staff message, called patient and lvm to discuss lab results/elevated liver enzymes.  Call back number provided.

## 2017-03-31 ENCOUNTER — Encounter: Payer: Self-pay | Admitting: *Deleted

## 2017-03-31 ENCOUNTER — Other Ambulatory Visit: Payer: Self-pay | Admitting: Internal Medicine

## 2017-03-31 DIAGNOSIS — Z79899 Other long term (current) drug therapy: Secondary | ICD-10-CM

## 2017-03-31 DIAGNOSIS — R7989 Other specified abnormal findings of blood chemistry: Secondary | ICD-10-CM

## 2017-03-31 DIAGNOSIS — R945 Abnormal results of liver function studies: Secondary | ICD-10-CM

## 2017-03-31 LAB — CBC WITH DIFFERENTIAL/PLATELET
BASOS PCT: 0 %
Basophils Absolute: 0 cells/uL (ref 0–200)
EOS ABS: 0 {cells}/uL — AB (ref 15–500)
Eosinophils Relative: 0 %
HEMATOCRIT: 35 % (ref 35.0–45.0)
HEMOGLOBIN: 10.8 g/dL — AB (ref 11.7–15.5)
LYMPHS ABS: 159988 {cells}/uL — AB (ref 850–3900)
Lymphocytes Relative: 94 %
MCH: 30.1 pg (ref 27.0–33.0)
MCHC: 30.9 g/dL — ABNORMAL LOW (ref 32.0–36.0)
MCV: 97.5 fL (ref 80.0–100.0)
MONO ABS: 1702 {cells}/uL — AB (ref 200–950)
MPV: 8.4 fL (ref 7.5–12.5)
Monocytes Relative: 1 %
Neutro Abs: 8510 cells/uL — ABNORMAL HIGH (ref 1500–7800)
Neutrophils Relative %: 5 %
Platelets: 89 10*3/uL — ABNORMAL LOW (ref 140–400)
RBC: 3.59 MIL/uL — ABNORMAL LOW (ref 3.80–5.10)
RDW: 17 % — ABNORMAL HIGH (ref 11.0–15.0)
WBC: 170.2 10*3/uL — AB (ref 3.8–10.8)

## 2017-03-31 LAB — GAMMA GT: GGT: 109 U/L — AB (ref 7–51)

## 2017-04-04 ENCOUNTER — Telehealth: Payer: Self-pay

## 2017-04-04 NOTE — Telephone Encounter (Signed)
Pt called asking what fax number to send her FMLA papers to for renewal. Called back and left Dr Grier Mitts fax number.

## 2017-04-05 ENCOUNTER — Ambulatory Visit
Admission: RE | Admit: 2017-04-05 | Discharge: 2017-04-05 | Disposition: A | Discharge status: Home / Self Care | Payer: 59 | Source: Ambulatory Visit | Attending: Sports Medicine | Admitting: Sports Medicine

## 2017-04-05 DIAGNOSIS — M545 Low back pain: Secondary | ICD-10-CM

## 2017-04-06 ENCOUNTER — Ambulatory Visit: Payer: 59 | Admitting: Internal Medicine

## 2017-04-06 ENCOUNTER — Other Ambulatory Visit: Payer: Self-pay | Admitting: Internal Medicine

## 2017-04-06 ENCOUNTER — Other Ambulatory Visit: Payer: 59

## 2017-04-06 ENCOUNTER — Other Ambulatory Visit: Payer: Self-pay

## 2017-04-06 DIAGNOSIS — R7989 Other specified abnormal findings of blood chemistry: Secondary | ICD-10-CM

## 2017-04-06 DIAGNOSIS — R945 Abnormal results of liver function studies: Secondary | ICD-10-CM

## 2017-04-06 DIAGNOSIS — Z79899 Other long term (current) drug therapy: Secondary | ICD-10-CM

## 2017-04-06 LAB — BASIC METABOLIC PANEL WITH GFR
BUN: 27 mg/dL — ABNORMAL HIGH (ref 7–25)
CALCIUM: 9.4 mg/dL (ref 8.6–10.4)
CO2: 24 mmol/L (ref 20–31)
Chloride: 99 mmol/L (ref 98–110)
Creat: 0.95 mg/dL — ABNORMAL HIGH (ref 0.60–0.93)
GFR, EST AFRICAN AMERICAN: 70 mL/min (ref >=60)
GFR, EST NON AFRICAN AMERICAN: 61 mL/min (ref >=60)
Glucose, Bld: 96 mg/dL (ref 65–99)
POTASSIUM: 4.8 mmol/L (ref 3.5–5.3)
SODIUM: 133 mmol/L — AB (ref 135–146)

## 2017-04-06 LAB — HEPATIC FUNCTION PANEL
ALK PHOS: 42 U/L (ref 33–130)
ALT: 46 U/L — ABNORMAL HIGH (ref 6–29)
AST: 29 U/L (ref 10–35)
Albumin: 4.3 g/dL (ref 3.6–5.1)
BILIRUBIN INDIRECT: 0.4 mg/dL (ref 0.2–1.2)
BILIRUBIN TOTAL: 0.6 mg/dL (ref 0.2–1.2)
Bilirubin, Direct: 0.2 mg/dL (ref <=0.2)
TOTAL PROTEIN: 6.2 g/dL (ref 6.1–8.1)

## 2017-04-06 NOTE — Addendum Note (Signed)
Addended by: Vicie Mutters R on: 04/06/2017 04:43 PM   Modules accepted: Orders

## 2017-04-07 LAB — CBC WITH DIFFERENTIAL/PLATELET
BASOS PCT: 0 %
Basophils Absolute: 0 cells/uL (ref 0–200)
EOS PCT: 0 %
Eosinophils Absolute: 0 cells/uL — ABNORMAL LOW (ref 15–500)
HEMATOCRIT: 33.8 % — AB (ref 35.0–45.0)
Hemoglobin: 10.5 g/dL — ABNORMAL LOW (ref 11.7–15.5)
LYMPHS PCT: 94 %
Lymphs Abs: 126054 cells/uL — ABNORMAL HIGH (ref 850–3900)
MCH: 30 pg (ref 27.0–33.0)
MCHC: 31.1 g/dL — AB (ref 32.0–36.0)
MCV: 96.6 fL (ref 80.0–100.0)
MONO ABS: 1341 {cells}/uL — AB (ref 200–950)
MONOS PCT: 1 %
MPV: 9.1 fL (ref 7.5–12.5)
NEUTROS PCT: 5 %
Neutro Abs: 6705 cells/uL (ref 1500–7800)
PLATELETS: 104 10*3/uL — AB (ref 140–400)
RBC: 3.5 MIL/uL — AB (ref 3.80–5.10)
RDW: 15.8 % — AB (ref 11.0–15.0)
WBC: 134.1 10*3/uL — AB (ref 3.8–10.8)

## 2017-04-07 LAB — GAMMA GT: GGT: 75 U/L — AB (ref 7–51)

## 2017-04-24 ENCOUNTER — Other Ambulatory Visit: Payer: 59

## 2017-04-24 ENCOUNTER — Ambulatory Visit (INDEPENDENT_AMBULATORY_CARE_PROVIDER_SITE_OTHER): Payer: 59 | Admitting: Internal Medicine

## 2017-04-24 ENCOUNTER — Ambulatory Visit: Payer: Self-pay | Admitting: Internal Medicine

## 2017-04-24 ENCOUNTER — Encounter: Payer: Self-pay | Admitting: Internal Medicine

## 2017-04-24 VITALS — BP 178/96 | HR 88 | Temp 97.3°F | Resp 18 | Ht 62.25 in | Wt 215.4 lb

## 2017-04-24 DIAGNOSIS — R079 Chest pain, unspecified: Secondary | ICD-10-CM | POA: Diagnosis not present

## 2017-04-24 DIAGNOSIS — R609 Edema, unspecified: Secondary | ICD-10-CM | POA: Diagnosis not present

## 2017-04-24 DIAGNOSIS — J0100 Acute maxillary sinusitis, unspecified: Secondary | ICD-10-CM

## 2017-04-24 DIAGNOSIS — Z79899 Other long term (current) drug therapy: Secondary | ICD-10-CM

## 2017-04-24 DIAGNOSIS — I10 Essential (primary) hypertension: Secondary | ICD-10-CM | POA: Diagnosis not present

## 2017-04-24 LAB — CBC WITH DIFFERENTIAL/PLATELET
BASOS ABS: 0 {cells}/uL (ref 0–200)
BASOS PCT: 0 %
EOS PCT: 0 %
Eosinophils Absolute: 0 cells/uL — ABNORMAL LOW (ref 15–500)
HCT: 32.7 % — ABNORMAL LOW (ref 35.0–45.0)
HEMOGLOBIN: 10.5 g/dL — AB (ref 11.7–15.5)
Lymphocytes Relative: 93 %
Lymphs Abs: 87141 cells/uL — ABNORMAL HIGH (ref 850–3900)
MCH: 31.1 pg (ref 27.0–33.0)
MCHC: 32.1 g/dL (ref 32.0–36.0)
MCV: 96.7 fL (ref 80.0–100.0)
MONOS PCT: 1 %
MPV: 8.3 fL (ref 7.5–12.5)
Monocytes Absolute: 937 cells/uL (ref 200–950)
NEUTROS ABS: 5622 {cells}/uL (ref 1500–7800)
Neutrophils Relative %: 6 %
PLATELETS: 87 10*3/uL — AB (ref 140–400)
RBC: 3.38 MIL/uL — AB (ref 3.80–5.10)
RDW: 15.1 % — ABNORMAL HIGH (ref 11.0–15.0)
WBC: 93.7 10*3/uL — ABNORMAL HIGH (ref 3.8–10.8)

## 2017-04-24 MED ORDER — PREDNISONE 20 MG PO TABS: ORAL_TABLET | ORAL | 0 refills | Status: DC

## 2017-04-24 MED ORDER — FUROSEMIDE 40 MG PO TABS: 40.0000 mg | ORAL_TABLET | Freq: Two times a day (BID) | ORAL | 11 refills | Status: DC

## 2017-04-24 MED ORDER — LEVOFLOXACIN 500 MG PO TABS: ORAL_TABLET | ORAL | 1 refills | Status: DC

## 2017-04-24 NOTE — Progress Notes (Signed)
Subjective:    Patient ID: Rebecca Bond, female    DOB: [birth year], 71 y.o.   MRN: [id number]  HPI    This very nice 71 yo MWF with HTN, preDM, CLL was seen 4/26 in f/u of 4/12 OV when HCTZ was added for dependent edema. Also she had recently noted LFT's by her Oncologist Dr Irene Limbo and labs repeated at the 4/26 OV found LFT's improving and Na low at 133 and rising BUN from 20->27 and Creat 0.84->0.95 and she was advised to stop her Hctz. Now she presents with elevated BPs ~170-180 and c/o swelling in extremities and feeling short of breath, and having other sx's of HA, sinus congestion and tenderness. She alleges sodium restriction and it's noted that her weight is the same over the last month.    Medication Sig  . ALPRAZolam (XANAX) 1 MG tablet TAKE 1/2-1 TAB  3 TIMES A DAY AS NEEDED  . bisoprolol-hctz  10-6.25 MG tab take 1 tablet by mouth once daily  . escitalopram  10 MG tablet Take 1 tablet daily for Mood  . ezetimibe (ZETIA) 10 MG tablet   . fexofenadine 180 MG tablet Take 1 tab daily PRN for allergies.  [name] Kitchen FLORASTOR 250 MG capsule Take 250 mg by mouth daily.   . minoxidil (LONITEN) 2.5 MG tablet Take 2.5 mg by mouth daily.  . Nasocort  daily as needed  . Sustain Ultra Eye drops   . pantoprazole  40 MG tablet Take 1 tab in the morning daily. (Patient taking differently: as needed. Take 1 tab in the morning daily.)  . hydrochlorothiazide12.5 MG tablet Patient not taking: Reported on 04/24/2017)   Allergies  Allergen Reactions  . Hyzaar [Losartan Potassium-Hctz] Other (See Comments)    "messed up my sodium counts" swells up lips.  [name] [Pseudoephedrine Hcl] Palpitations  . Biaxin [Clarithromycin]     GI Upset  . Celexa [Citalopram]   . Flexeril [Cyclobenzaprine]     "zombie-like" feeling  . Fosamax [Alendronate Sodium]     GI upset  . Iohexol Hives     Code: HIVES, Desc: pt states she broke out in hive 20 yrs ago from IV contrast.     . Losartan     Angioedema  .  Meloxicam     GI upset  . Norvasc [Amlodipine] Swelling  . Pseudoephedrine     Palpitations  . Zoloft [Sertraline Hcl]    Past Medical History:  Diagnosis Date  . CLL (chronic lymphocytic leukemia) ([place])   . GERD (gastroesophageal reflux disease)   . OSA (obstructive sleep apnea)   . Prediabetes   . Recurrent sinus infections 08/02/2012   Past Surgical History:  Procedure Laterality Date  . ABDOMINAL HYSTERECTOMY  1980's   "endometrosis"  . BREAST SURGERY    . CHOLECYSTECTOMY  1985  . FUNCTIONAL ENDOSCOPIC SINUS SURGERY  1990's   "cause I kept having sinus infections"  . immunoglobulin treatment  2017  . LAPAROSCOPIC APPENDECTOMY N/A 06/04/2014   Procedure: APPENDECTOMY LAPAROSCOPIC;  Surgeon: Zenovia Jarred, MD;  Location: [facility];  Service: General;  Laterality: N/A;  . LYMPH NODE BIOPSY     "determined I had CLL"  . TEMPOROMANDIBULAR JOINT ARTHROPLASTY  1980's  . TONSILLECTOMY AND ADENOIDECTOMY  1950's   Review of Systems  10 point systems review negative except as above.    Objective:   Physical Exam  BP  178/96   Pulse 88   T  97.3 F    R  18   Ht 5' 2.25" Wt 215 lb 6.4 oz   BMI 39.08  Eyelids puffy wiy facial indentd from yesterday's Cpap use.   HEENT - Eac's patent. TM's Nl. EOM's full. PERRLA. Bilat maxillary tenderness. NasoOroPharynx clear. Neck - supple. Nl Thyroid. Carotids 2+ & No bruits, nodes. JV diff to evaluate.  Chest - BS decreased  ? due to CW thickness. No rales, rhonchi or wheezes.  Cor - Nl HS. RRR w/o sig MGR. PP obscured by dependent edema. MS- FROM w/o deformities.  Gait Nl. Neuro - No obvious Cr N abnormalities.  Nl w/o focal abnormalities.    Assessment & Plan:   1. Essential hypertension  - EKG 12-Lead - furosemide (LASIX) 40 MG tablet; Take 1 tablet (40 mg total) by mouth 2 (two) times daily.  Dispense: 60 tablet; Refill: 11  2. Edema  - Brain natriuretic peptide - Troponin I - furosemide (LASIX) 40 MG tablet; Take 1 tablet  (40 mg total) by mouth 2 (two) times daily.  Dispense: 60 tablet; Refill: 11  3. Chest pain, unspecified type  - EKG 12-Lead  4. Medication management  - CBC with Differential/Platelet - BASIC METABOLIC PANEL WITH GFR - Hepatic function panel  5. Subacute maxillary sinusitis  - levofloxacin 500 MG tablet; Take 1 tablet daily with food for infection  Dispense: 15 tablet; Refill: 1 - predniSONE 20 MG tablet; 1 tab 3 x day for 2 days, then 1 tab 2 x day for 2 days, then 1 tab 1 x day for 3 days  Dispense: 13 tablet; Refill: 0  - Discussed diet /exercise  - F/u pending labs  Over 30 minutes of exam, counseling, chart review and high complex critical decision making was performed

## 2017-04-25 ENCOUNTER — Telehealth: Payer: Self-pay | Admitting: *Deleted

## 2017-04-25 ENCOUNTER — Encounter: Payer: Self-pay | Admitting: Internal Medicine

## 2017-04-25 LAB — HEPATIC FUNCTION PANEL
ALT: 30 U/L — ABNORMAL HIGH (ref 6–29)
AST: 20 U/L (ref 10–35)
Albumin: 4.3 g/dL (ref 3.6–5.1)
Alkaline Phosphatase: 71 U/L (ref 33–130)
BILIRUBIN DIRECT: 0.2 mg/dL (ref <=0.2)
BILIRUBIN TOTAL: 0.8 mg/dL (ref 0.2–1.2)
Indirect Bilirubin: 0.6 mg/dL (ref 0.2–1.2)
Total Protein: 6.1 g/dL (ref 6.1–8.1)

## 2017-04-25 LAB — BASIC METABOLIC PANEL WITH GFR
BUN: 8 mg/dL (ref 7–25)
CO2: 21 mmol/L (ref 20–31)
Calcium: 9.1 mg/dL (ref 8.6–10.4)
Chloride: 102 mmol/L (ref 98–110)
Creat: 0.82 mg/dL (ref 0.60–0.93)
GFR, EST NON AFRICAN AMERICAN: 73 mL/min (ref >=60)
GFR, Est African American: 84 mL/min (ref >=60)
Glucose, Bld: 88 mg/dL (ref 65–99)
Potassium: 4 mmol/L (ref 3.5–5.3)
SODIUM: 138 mmol/L (ref 135–146)

## 2017-04-25 LAB — BRAIN NATRIURETIC PEPTIDE: BRAIN NATRIURETIC PEPTIDE: 104.9 pg/mL — AB (ref <100)

## 2017-04-25 LAB — TROPONIN I

## 2017-04-25 NOTE — Telephone Encounter (Signed)
Patient called and reported when she took Levaquin in the past, she nausea and diarrhea.  The paient has a refill on a Z-pak, which Dr Melford Aase agreed it would be OK to take. Also, the patient is having an epidural injection on 04/28/2017, which Dr Melford Aase states it will OK to do , on her current medications, if the other physician agrees.

## 2017-04-26 ENCOUNTER — Encounter: Payer: Self-pay | Admitting: *Deleted

## 2017-04-27 ENCOUNTER — Encounter: Payer: Self-pay | Admitting: Internal Medicine

## 2017-05-03 ENCOUNTER — Encounter: Payer: Self-pay | Admitting: Internal Medicine

## 2017-05-03 ENCOUNTER — Ambulatory Visit (INDEPENDENT_AMBULATORY_CARE_PROVIDER_SITE_OTHER): Payer: 59 | Admitting: Internal Medicine

## 2017-05-03 ENCOUNTER — Other Ambulatory Visit: Payer: Self-pay | Admitting: Internal Medicine

## 2017-05-03 VITALS — BP 132/70 | HR 68 | Temp 97.4°F | Resp 16 | Ht 62.25 in | Wt 209.0 lb

## 2017-05-03 DIAGNOSIS — I10 Essential (primary) hypertension: Secondary | ICD-10-CM | POA: Diagnosis not present

## 2017-05-03 DIAGNOSIS — J0140 Acute pansinusitis, unspecified: Secondary | ICD-10-CM | POA: Diagnosis not present

## 2017-05-03 DIAGNOSIS — Z79899 Other long term (current) drug therapy: Secondary | ICD-10-CM

## 2017-05-03 DIAGNOSIS — R609 Edema, unspecified: Secondary | ICD-10-CM

## 2017-05-03 DIAGNOSIS — N289 Disorder of kidney and ureter, unspecified: Secondary | ICD-10-CM

## 2017-05-03 LAB — BASIC METABOLIC PANEL WITH GFR
BUN: 31 mg/dL — AB (ref 7–25)
CO2: 25 mmol/L (ref 20–31)
Calcium: 9.4 mg/dL (ref 8.6–10.4)
Chloride: 88 mmol/L — ABNORMAL LOW (ref 98–110)
Creat: 1.04 mg/dL — ABNORMAL HIGH (ref 0.60–0.93)
GFR, EST AFRICAN AMERICAN: 63 mL/min (ref >=60)
GFR, EST NON AFRICAN AMERICAN: 55 mL/min — AB (ref >=60)
Glucose, Bld: 121 mg/dL — ABNORMAL HIGH (ref 65–99)
POTASSIUM: 3.7 mmol/L (ref 3.5–5.3)
SODIUM: 129 mmol/L — AB (ref 135–146)

## 2017-05-03 MED ORDER — AZITHROMYCIN 250 MG PO TABS: ORAL_TABLET | ORAL | 1 refills | Status: DC

## 2017-05-03 NOTE — Patient Instructions (Signed)

## 2017-05-03 NOTE — Progress Notes (Signed)
Subjective:    Patient ID: Rebecca Bond, female    DOB: 09-05-1946, 71 y.o.   MRN: [id number]  HPI  This very nice 71 yo MWF with multiple co-morbidities including HTN, venous insufficiency,HLD, PreDM , CLL recently had HCTZ added for worsening dependent edema with subsequent rise in BUN and drop in sodium , then had HCTZ d/c'd with rapid re-accumulation of edema and then had Lasix added and now returns for 1 week f/u  down 6# to 2089# . She also is completing 10 days Levaquin for frontal sinusitis and reports improvement, but persisting residual frontal tenderness.   Medication Sig  . ALPRAZolam  1 MG tablet TAKE 1/2 TO 1 TABLET BY MOUTH 3 TIMES A DAY AS NEEDED  . bisoprolol-hctz [facility]) 10-6.25  take 1 tablet by mouth once daily  . escitalopram ( 10 MG tablet Take 1 tablet daily for Mood  . ezetimibe  10 MG tablet Take 1 tablet daily   . fexofenadine  180 MG tablet Take 1 tablet (180 mg total) by mouth daily. Take PRN for allergies.  [name] Kitchen FLORASTOR 250 MG capsule Take 250 mg by mouth daily.   . furosemide  40 MG tablet Take 1 tablet (40 mg total) by mouth 2 (two) times daily.  . minoxidil  2.5 MG tablet Take 2.5 mg by mouth daily.  . Nasocort daily as needed   . Sustain Ultra Eye drops   . pantoprazole (PROTONIX) 40 MG tablet Take 1 tab in the morning daily as needed.    Allergies  Allergen Reactions  . Hyzaar [Losartan Potassium-Hctz] Other (See Comments)    "messed up my sodium counts" swells up lips.  [name] [Pseudoephedrine Hcl] Palpitations  . Levaquin [Levofloxacin In D5w] Diarrhea and Nausea Only  . Biaxin [Clarithromycin]     GI Upset  . Celexa [Citalopram]   . Flexeril [Cyclobenzaprine]     "zombie-like" feeling  . Fosamax [Alendronate Sodium]     GI upset  . Iohexol Hives     Code: HIVES, Desc: pt states she broke out in hive 20 yrs ago from IV contrast.     . Losartan     Angioedema  . Meloxicam     GI upset  . Norvasc [Amlodipine] Swelling  .  Pseudoephedrine     Palpitations  . Zoloft [Sertraline Hcl]    Past Medical History:  Diagnosis Date  . CLL (chronic lymphocytic leukemia) ([place])   . GERD (gastroesophageal reflux disease)   . OSA (obstructive sleep apnea)   . Prediabetes   . Recurrent sinus infections 08/02/2012   Past Surgical History:  Procedure Laterality Date  . ABDOMINAL HYSTERECTOMY  1980's   "endometrosis"  . BREAST SURGERY    . CHOLECYSTECTOMY  1985  . FUNCTIONAL ENDOSCOPIC SINUS SURGERY  1990's   "cause I kept having sinus infections"  . immunoglobulin treatment  2017  . LAPAROSCOPIC APPENDECTOMY N/A 06/04/2014   Procedure: APPENDECTOMY LAPAROSCOPIC;  Surgeon: Zenovia Jarred, MD;  Location: [place];  Service: General;  Laterality: N/A;  . LYMPH NODE BIOPSY     "determined I had CLL"  . TEMPOROMANDIBULAR JOINT ARTHROPLASTY  1980's  . TONSILLECTOMY AND ADENOIDECTOMY  1950's   Review of Systems  10 point systems review negative except as above.    Objective:   Physical Exam  BP 132/70   Pulse 68   Temp 97.4 F (36.3 C)   Resp 16   Ht 5' 2.25" (1.581 m)   Wt 209 lb (  94.8 kg)   BMI 37.92 kg/m   HEENT - WNL. (+) frontal tenderness. Neck - supple.  Chest - Clear equal BS. Cor - Nl HS. RRR w/o sig MGR. PP 1(+). 1(+) pretibial edema. MS- FROM w/o deformities.  Gait Nl. Neuro -  Nl w/o focal abnormalities.    Assessment & Plan:   1. Hypertension   2. Edema   3. Subacute pansinusitis  - azithromycin (ZITHROMAX) 250 MG tablet; Take 2 tablets (500 mg) on  Day 1,  followed by 1 tablet (250 mg) once daily on Days 2 through 5.  Dispense: 6 each; Refill: 1  4. Medication management  - BASIC METABOLIC PANEL WITH GFR  - discussed meds/SE's.  - has 05/31/17 f/u w/ Vicie Mutters, PA-C

## 2017-05-18 ENCOUNTER — Encounter: Payer: Self-pay | Admitting: Hematology

## 2017-05-18 ENCOUNTER — Telehealth: Payer: Self-pay | Admitting: Hematology

## 2017-05-18 ENCOUNTER — Other Ambulatory Visit (HOSPITAL_BASED_OUTPATIENT_CLINIC_OR_DEPARTMENT_OTHER): Payer: 59

## 2017-05-18 ENCOUNTER — Ambulatory Visit (HOSPITAL_BASED_OUTPATIENT_CLINIC_OR_DEPARTMENT_OTHER): Payer: 59 | Admitting: Hematology

## 2017-05-18 VITALS — BP 144/56 | HR 73 | Temp 98.3°F | Resp 20 | Ht 62.25 in | Wt 210.6 lb

## 2017-05-18 DIAGNOSIS — C9110 Chronic lymphocytic leukemia of B-cell type not having achieved remission: Secondary | ICD-10-CM

## 2017-05-18 DIAGNOSIS — D696 Thrombocytopenia, unspecified: Secondary | ICD-10-CM

## 2017-05-18 DIAGNOSIS — D649 Anemia, unspecified: Secondary | ICD-10-CM

## 2017-05-18 LAB — COMPREHENSIVE METABOLIC PANEL
ALT: 21 U/L (ref 0–55)
AST: 26 U/L (ref 5–34)
Albumin: 4.2 g/dL (ref 3.5–5.0)
Alkaline Phosphatase: 58 U/L (ref 40–150)
Anion Gap: 12 mEq/L — ABNORMAL HIGH (ref 3–11)
BUN: 12.6 mg/dL (ref 7.0–26.0)
CO2: 25 meq/L (ref 22–29)
Calcium: 9.9 mg/dL (ref 8.4–10.4)
Chloride: 98 mEq/L (ref 98–109)
Creatinine: 0.9 mg/dL (ref 0.6–1.1)
EGFR: 69 mL/min/{1.73_m2} — ABNORMAL LOW (ref >90)
GLUCOSE: 93 mg/dL (ref 70–140)
POTASSIUM: 3.6 meq/L (ref 3.5–5.1)
SODIUM: 135 meq/L — AB (ref 136–145)
TOTAL PROTEIN: 6.9 g/dL (ref 6.4–8.3)
Total Bilirubin: 0.78 mg/dL (ref 0.20–1.20)

## 2017-05-18 LAB — CBC & DIFF AND RETIC
BASO%: 0.2 % (ref 0.0–2.0)
BASOS ABS: 0.2 10*3/uL — AB (ref 0.0–0.1)
EOS%: 0.2 % (ref 0.0–7.0)
Eosinophils Absolute: 0.2 10*3/uL (ref 0.0–0.5)
HCT: 33.1 % — ABNORMAL LOW (ref 34.8–46.6)
HEMOGLOBIN: 10.6 g/dL — AB (ref 11.6–15.9)
IMMATURE RETIC FRACT: 25.7 % — AB (ref 1.60–10.00)
LYMPH%: 93.1 % — AB (ref 14.0–49.7)
MCH: 30.5 pg (ref 25.1–34.0)
MCHC: 32 g/dL (ref 31.5–36.0)
MCV: 95.1 fL (ref 79.5–101.0)
MONO#: 1.2 10*3/uL — AB (ref 0.1–0.9)
MONO%: 1.2 % (ref 0.0–14.0)
NEUT%: 5.3 % — ABNORMAL LOW (ref 38.4–76.8)
NEUTROS ABS: 5.6 10*3/uL (ref 1.5–6.5)
NRBC: 0 % (ref 0–0)
Platelets: 83 10*3/uL — ABNORMAL LOW (ref 145–400)
RBC: 3.48 10*6/uL — AB (ref 3.70–5.45)
RDW: 14.7 % — AB (ref 11.2–14.5)
Retic %: 2.41 % — ABNORMAL HIGH (ref 0.70–2.10)
Retic Ct Abs: 83.87 10*3/uL (ref 33.70–90.70)
WBC: 103.3 10*3/uL — AB (ref 3.9–10.3)
lymph#: 96.1 10*3/uL — ABNORMAL HIGH (ref 0.9–3.3)

## 2017-05-18 LAB — LACTATE DEHYDROGENASE: LDH: 242 U/L (ref 125–245)

## 2017-05-18 NOTE — Telephone Encounter (Signed)
Per 6/14 no LOS

## 2017-05-18 NOTE — Patient Instructions (Signed)
Thank you for choosing [name] to provide your oncology and hematology care.  To afford each patient quality time with our providers, please arrive 30 minutes before your scheduled appointment time.  If you arrive late for your appointment, you may be asked to reschedule.  We strive to give you quality time with our providers, and arriving late affects you and other patients whose appointments are after yours.  If you are a no show for multiple scheduled visits, you may be dismissed from the clinic at the providers discretion.   Again, thank you for choosing [facility], our hope is that these requests will decrease the amount of time that you wait before being seen by our physicians.  ______________________________________________________________________ Should you have questions after your visit to the [facility], please contact our office at [phone] between the hours of 8:30 and 4:30 p.m.    Voicemails left after 4:30p.m will not be returned until the following business day.   For prescription refill requests, please have your pharmacy contact us directly.  Please also try to allow 48 hours for prescription requests.   Please contact the scheduling department for questions regarding scheduling.  For scheduling of procedures such as PET scans, CT scans, MRI, Ultrasound, etc please contact central scheduling at [phone].   Resources For Cancer Patients and Caregivers:  American Cancer Society:  800-227-2345  Can help patients locate various types of support and financial assistance Cancer Care: 1-800-813-HOPE (4673) Provides financial assistance, online support groups, medication/co-pay assistance.   [place] DSS:  336-641-3447 Where to apply for food stamps, Medicaid, and utility assistance Medicare Rights Center: 800-333-4114 Helps people with Medicare understand their rights and benefits, navigate the Medicare system, and secure the  quality healthcare they deserve SCAT: 336-333-6589 [place] [employer]'s shared-ride transportation service for eligible riders who have a disability that prevents them from riding the fixed route bus.   For additional information on assistance programs please contact our social worker:   Grier Hock/Abigail Elmore:  [phone] 

## 2017-05-23 ENCOUNTER — Telehealth: Payer: Self-pay | Admitting: Hematology

## 2017-05-23 ENCOUNTER — Other Ambulatory Visit: Payer: 59

## 2017-05-23 ENCOUNTER — Ambulatory Visit: Payer: 59 | Admitting: Hematology

## 2017-05-23 NOTE — Telephone Encounter (Signed)
Scheduled appt per 6/19 sch message - unable to leave message sent reminder letter in the mail.

## 2017-05-24 ENCOUNTER — Other Ambulatory Visit: Payer: Self-pay | Admitting: Internal Medicine

## 2017-05-25 ENCOUNTER — Telehealth: Payer: Self-pay | Admitting: *Deleted

## 2017-05-25 NOTE — Telephone Encounter (Signed)
Patient called and states her fet and ankles are swollen again. Per Dr Melford Aase, increase the Lasix 40 mg to 1 tablet BID and if no response , she can increase to 2 tablets BID.  Patient is aware and has an OV here next week.

## 2017-05-30 NOTE — Progress Notes (Signed)
Assessment and Plan:   Essential hypertension Switch minoxidil to bisprolol  If any CP, SOB, HA, dizziness, etc go to ER, patient expressed understands -     minoxidil (LONITEN) 2.5 MG tablet; Take 1 tablet (2.5 mg total) by mouth daily. -     CBC with Differential/Platelet -     BASIC METABOLIC PANEL WITH GFR -     Hepatic function panel -     TSH  Mixed hyperlipidemia -continue medications, check lipids, decrease fatty foods, increase activity.  -     Lipid panel  Prediabetes Discussed general issues about diabetes pathophysiology and management., Educational material distributed., Suggested low cholesterol diet., Encouraged aerobic exercise., Discussed foot care., Reminded to get yearly retinal exam. -     Hemoglobin A1c  Vitamin D deficiency  CLL (chronic lymphocytic leukemia) ([place]) Continue follow up oncology  Medication management  Morbid Obesity with co morbidities - long discussion about weight loss, diet, and exercise  Generalized anxiety disorder Doing well, work has improved -     escitalopram (LEXAPRO) 20 MG tablet; Take 1 tablet (20 mg total) by mouth daily. -     Hemoglobin A1c  Edema ? From minoxidil will switch Elevate Check labs eKG normal, weight stable, normal echo 2014  Continue diet and meds as discussed. Further disposition pending results of labs. OVER 40 minutes of exam, counseling, chart review, referral performed   HPI 71 y.o. female  presents for over due 3 month follow up - follows up with hypertension, hyperlipidemia, prediabetes, Vitamin D, and complicated history of CLL .    Her blood pressure has been controlled at home, BP: 122/80  She had a normal stress test 2014, cath 2003. She has right going pain, across her back, chest pain, had normal CT pelvis without contrast, has had colonoscopy and EGD this past week with GI, pending BX, has had diarrhea. Continues to have RUQ pain to back.   She has also been having bilateral lower back  pain, some left foot drop, has had steroid injections and going to get another July 13 and has done PT, following Dr. Oneita Kras, may need surgery if not helped. She has been feeling SOB with walking, no PND, orthopnea, on CPAP, weight is stable. She has bilateral swelling in her feet, feels like skin will "pop" open. She has been on lasix but states that it is not helping, has to take two but causing abnormal kidney function. Normal stress test 2014, normal cath 2003, echo 2014 with normal EF and normal diastolic function. She is on 1 potassium a day.  Lab Results  Component Value Date   CREATININE 0.9 05/18/2017   BUN 12.6 05/18/2017   NA 135 (L) 05/18/2017   K 3.6 05/18/2017   CL 88 (L) 05/03/2017   CO2 25 05/18/2017   Wt Readings from Last 9 Encounters:  05/31/17 211 lb 12.8 oz (96.1 kg)  05/18/17 210 lb 9.6 oz (95.5 kg)  05/03/17 209 lb (94.8 kg)  04/24/17 215 lb 6.4 oz (97.7 kg)  03/30/17 215 lb (97.5 kg)  03/27/17 218 lb (98.9 kg)  03/16/17 214 lb (97.1 kg)  02/27/17 210 lb 12.8 oz (95.6 kg)  02/09/17 210 lb 3.2 oz (95.3 kg)    She does not workout. She denies chest pain, shortness of breath, dizziness.  She is on cholesterol medication and denies myalgias. Her cholesterol is at goal. The cholesterol last visit was:   Lab Results  Component Value Date   CHOL 191 02/27/2017  HDL 47 (L) 02/27/2017   LDLCALC 115 (H) 02/27/2017   TRIG 146 02/27/2017   CHOLHDL 4.1 02/27/2017    She has been working on diet and exercise for prediabetes, and denies paresthesia of the feet, polydipsia, polyuria and visual disturbances. Last A1C in the office was:  Lab Results  Component Value Date   HGBA1C 5.1 02/27/2017   Patient is on Vitamin D supplement.   Lab Results  Component Value Date   VD25OH 51 02/27/2017     BMI is Body mass index is 38.43 kg/m., she is working on diet and exercise.+ OSA on CPAP.  Wt Readings from Last 3 Encounters:  05/31/17 211 lb 12.8 oz (96.1 kg)  05/18/17 210  lb 9.6 oz (95.5 kg)  05/03/17 209 lb (94.8 kg)  Patient was dx'd with CLL in Dec 2009 and been monitored by Dr Beryle Beams and more recently by Dr Marko Plume. She is receiving IVIG.  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 Lab Results  Component Value Date   WBC 103.3 (HH) 05/18/2017   HGB 10.6 (L) 05/18/2017   HCT 33.1 (L) 05/18/2017   MCV 95.1 05/18/2017   PLT 83 (L) 05/18/2017     Current Medications:  Current Outpatient Prescriptions on File Prior to Visit  Medication Sig Dispense Refill  . ALPRAZolam (XANAX) 1 MG tablet TAKE 1/2 TO 1 TABLET BY MOUTH 3 TIMES A DAY AS NEEDED 90 tablet 0  . escitalopram (LEXAPRO) 10 MG tablet Take 1 tablet daily for Mood 90 tablet 1  . ezetimibe (ZETIA) 10 MG tablet   0  . fexofenadine (ALLEGRA) 180 MG tablet Take 1 tablet (180 mg total) by mouth daily. Take PRN for allergies. 30 tablet 6  . FLORASTOR 250 MG capsule Take 250 mg by mouth daily.   1  . furosemide (LASIX) 40 MG tablet Take 1 tablet (40 mg total) by mouth 2 (two) times daily. (Patient taking differently: Take 40 mg by mouth daily. ) 60 tablet 11  . minoxidil (LONITEN) 2.5 MG tablet Take 2.5 mg by mouth daily.  0  . OVER THE COUNTER MEDICATION Nasocort daily as needed    . OVER THE COUNTER MEDICATION Sustain Ultra Eye drops    . pantoprazole (PROTONIX) 40 MG tablet Take 1 tab in the morning daily. (Patient taking differently: as needed. Take 1 tab in the morning daily.) 90 tablet 3   No current facility-administered medications on file prior to visit.    Medical History:  Past Medical History:  Diagnosis Date  . CLL (chronic lymphocytic leukemia) ([place])   . GERD (gastroesophageal reflux disease)   . OSA (obstructive sleep apnea)   . Prediabetes   . Recurrent sinus infections 08/02/2012   Allergies:  Allergies  Allergen Reactions  . Hyzaar [Losartan Potassium-Hctz] Other (See Comments)    "messed up my sodium counts" swells up lips.  [name] [Pseudoephedrine Hcl] Palpitations  .  Levaquin [Levofloxacin In D5w] Diarrhea and Nausea Only  . Biaxin [Clarithromycin]     GI Upset  . Celexa [Citalopram]   . Flexeril [Cyclobenzaprine]     "zombie-like" feeling  . Fosamax [Alendronate Sodium]     GI upset  . Iohexol Hives     Code: HIVES, Desc: pt states she broke out in hive 20 yrs ago from IV contrast.     . Losartan     Angioedema  . Meloxicam     GI upset  . Norvasc [Amlodipine] Swelling  . Pseudoephedrine  Palpitations  . Zoloft [Sertraline Hcl]      Review of Systems:  Review of Systems  Constitutional: Positive for malaise/fatigue. Negative for chills, diaphoresis, fever and weight loss.  HENT: Negative for congestion, ear discharge, ear pain, hearing loss, nosebleeds, sore throat and tinnitus.        + TMJ  Eyes: Negative.  Negative for blurred vision and double vision.  Respiratory: Positive for shortness of breath. Negative for cough, hemoptysis, sputum production, wheezing and stridor.   Cardiovascular: Positive for chest pain. Negative for palpitations, orthopnea, claudication, leg swelling and PND.  Gastrointestinal: Positive for heartburn. Negative for abdominal pain, blood in stool, constipation, diarrhea, melena, nausea and vomiting.  Genitourinary: Negative for dysuria, flank pain, frequency, hematuria and urgency.  Musculoskeletal: Positive for back pain, joint pain (left shoulder) and myalgias. Negative for falls and neck pain.  Skin: Negative.   Neurological: Negative for dizziness, tingling, tremors, sensory change, speech change, focal weakness, seizures, loss of consciousness, weakness and headaches.  Endo/Heme/Allergies: Negative for environmental allergies and polydipsia. Does not bruise/bleed easily.  Psychiatric/Behavioral: Positive for depression. Negative for hallucinations, memory loss, substance abuse and suicidal ideas. The patient is nervous/anxious. The patient does not have insomnia.     Family history- Review and  unchanged Social history- Review and unchanged Physical Exam: BP 122/80   Pulse 77   Temp 97.3 F (36.3 C)   Resp 16   Ht 5' 2.25" (1.581 m)   Wt 211 lb 12.8 oz (96.1 kg)   SpO2 95%   BMI 38.43 kg/m  Wt Readings from Last 3 Encounters:  05/31/17 211 lb 12.8 oz (96.1 kg)  05/18/17 210 lb 9.6 oz (95.5 kg)  05/03/17 209 lb (94.8 kg)   General Appearance: Well nourished, in no apparent distress, no diaphroesis Eyes: PERRLA, EOMs, conjunctiva no swelling or erythema Sinuses: + Frontal/maxillary tenderness ENT/Mouth: Ext aud canals clear, TMs without erythema, bulging. No erythema, swelling, or exudate on post pharynx.  Tonsils not swollen or erythematous. Hearing normal. + TMJ tenderness Neck: Supple, thyroid normal.  Respiratory: Respiratory effort normal, BS equal bilaterally without rales, rhonchi, wheezing or stridor.  Cardio: RRR with no MRGs. Brisk peripheral pulses without edema.  Abdomen: Soft, + BS, obese, diffusely overly tender to palpation, no peritoneal signs, + guarding, without rebound, hernias, masses. Lymphatics: Non tender without lymphadenopathy.  Musculoskeletal: Full ROM, 5/5 strength, Normal gait, Strength is normal and symmetric in arms. Skin:  Warm, dry without rashes, lesions, ecchymosis.  Neuro: Cranial nerves intact. Normal muscle tone, no cerebellar symptoms. Psych: Awake and oriented X 3, normal affect, appears anxious, Insight and Judgment appropriate.    Vicie Mutters, PA-C 5:15 PM [facility]

## 2017-05-31 ENCOUNTER — Ambulatory Visit (INDEPENDENT_AMBULATORY_CARE_PROVIDER_SITE_OTHER): Payer: 59 | Admitting: Physician Assistant

## 2017-05-31 ENCOUNTER — Encounter: Payer: Self-pay | Admitting: Physician Assistant

## 2017-05-31 VITALS — BP 122/80 | HR 77 | Temp 97.3°F | Resp 16 | Ht 62.25 in | Wt 211.8 lb

## 2017-05-31 DIAGNOSIS — J3089 Other allergic rhinitis: Secondary | ICD-10-CM

## 2017-05-31 DIAGNOSIS — E782 Mixed hyperlipidemia: Secondary | ICD-10-CM

## 2017-05-31 DIAGNOSIS — F325 Major depressive disorder, single episode, in full remission: Secondary | ICD-10-CM

## 2017-05-31 DIAGNOSIS — R609 Edema, unspecified: Secondary | ICD-10-CM

## 2017-05-31 DIAGNOSIS — C9110 Chronic lymphocytic leukemia of B-cell type not having achieved remission: Secondary | ICD-10-CM

## 2017-05-31 DIAGNOSIS — R7303 Prediabetes: Secondary | ICD-10-CM | POA: Diagnosis not present

## 2017-05-31 DIAGNOSIS — C9190 Lymphoid leukemia, unspecified not having achieved remission: Secondary | ICD-10-CM | POA: Diagnosis not present

## 2017-05-31 DIAGNOSIS — R7989 Other specified abnormal findings of blood chemistry: Secondary | ICD-10-CM

## 2017-05-31 DIAGNOSIS — D849 Immunodeficiency, unspecified: Secondary | ICD-10-CM | POA: Diagnosis not present

## 2017-05-31 DIAGNOSIS — D899 Disorder involving the immune mechanism, unspecified: Secondary | ICD-10-CM

## 2017-05-31 DIAGNOSIS — R945 Abnormal results of liver function studies: Secondary | ICD-10-CM

## 2017-05-31 DIAGNOSIS — D696 Thrombocytopenia, unspecified: Secondary | ICD-10-CM

## 2017-05-31 DIAGNOSIS — D801 Nonfamilial hypogammaglobulinemia: Secondary | ICD-10-CM

## 2017-05-31 DIAGNOSIS — I10 Essential (primary) hypertension: Secondary | ICD-10-CM | POA: Diagnosis not present

## 2017-05-31 DIAGNOSIS — E559 Vitamin D deficiency, unspecified: Secondary | ICD-10-CM | POA: Diagnosis not present

## 2017-05-31 DIAGNOSIS — Z79899 Other long term (current) drug therapy: Secondary | ICD-10-CM | POA: Diagnosis not present

## 2017-05-31 DIAGNOSIS — Z6837 Body mass index (BMI) 37.0-37.9, adult: Secondary | ICD-10-CM

## 2017-05-31 LAB — TSH: TSH: 4.71 m[IU]/L — AB

## 2017-05-31 MED ORDER — BISOPROLOL FUMARATE 5 MG PO TABS: 5.0000 mg | ORAL_TABLET | Freq: Every day | ORAL | 3 refills | Status: DC

## 2017-05-31 NOTE — Patient Instructions (Signed)
Stop the minoxidil and start 1/2 of the new BP med, can go up to 1 pill Elevated legs Do compression stockings    Edema Edema is an abnormal buildup of fluids in your bodytissues. Edema is somewhatdependent on gravity to pull the fluid to the lowest place in your body. That makes the condition more common in the legs and thighs (lower extremities). Painless swelling of the feet and ankles is common and becomes more likely as you get older. It is also common in looser tissues, like around your eyes. When the affected area is squeezed, the fluid may move out of that spot and leave a dent for a few moments. This dent is called pitting. What are the causes? There are many possible causes of edema. Eating too much salt and being on your feet or sitting for a long time can cause edema in your legs and ankles. Hot weather may make edema worse. Common medical causes of edema include:  Heart failure.  Liver disease.  Kidney disease.  Weak blood vessels in your legs.  Cancer.  An injury.  Pregnancy.  Some medications.  Obesity.  What are the signs or symptoms? Edema is usually painless.Your skin may look swollen or shiny. How is this diagnosed? Your health care provider may be able to diagnose edema by asking about your medical history and doing a physical exam. You may need to have tests such as X-rays, an electrocardiogram, or blood tests to check for medical conditions that may cause edema. How is this treated? Edema treatment depends on the cause. If you have heart, liver, or kidney disease, you need the treatment appropriate for these conditions. General treatment may include:  Elevation of the affected body part above the level of your heart.  Compression of the affected body part. Pressure from elastic bandages or support stockings squeezes the tissues and forces fluid back into the blood vessels. This keeps fluid from entering the tissues.  Restriction of fluid and salt  intake.  Use of a water pill (diuretic). These medications are appropriate only for some types of edema. They pull fluid out of your body and make you urinate more often. This gets rid of fluid and reduces swelling, but diuretics can have side effects. Only use diuretics as directed by your health care provider.  Follow these instructions at home:  Keep the affected body part above the level of your heart when you are lying down.  Do not sit still or stand for prolonged periods.  Do not put anything directly under your knees when lying down.  Do not wear constricting clothing or garters on your upper legs.  Exercise your legs to work the fluid back into your blood vessels. This may help the swelling go down.  Wear elastic bandages or support stockings to reduce ankle swelling as directed by your health care provider.  Eat a low-salt diet to reduce fluid if your health care provider recommends it.  Only take medicines as directed by your health care provider. Contact a health care provider if:  Your edema is not responding to treatment.  You have heart, liver, or kidney disease and notice symptoms of edema.  You have edema in your legs that does not improve after elevating them.  You have sudden and unexplained weight gain. Get help right away if:  You develop shortness of breath or chest pain.  You cannot breathe when you lie down.  You develop pain, redness, or warmth in the swollen areas.  You have heart, liver, or kidney disease and suddenly get edema.  You have a fever and your symptoms suddenly get worse. This information is not intended to replace advice given to you by your health care provider. Make sure you discuss any questions you have with your health care provider. Document Released: 11/21/2005 Document Revised: 04/28/2016 Document Reviewed: 09/13/2013 Elsevier Interactive Patient Education  2017 [employer]   Varicose Veins Varicose veins are veins that  have become enlarged and twisted. CAUSES This condition is the result of valves in the veins not working properly. Valves in the veins help return blood from the leg to the heart. When your calf muscles squeeze, the blood moves up your leg then the valves close and this continues until the blood gets back to your heart.  If these valves are damaged, blood flows backwards and backs up into the veins in the leg near the skin OR if your are sitting/standing for a long time without using your calf muscles the blood will back up into the veins in your legs. This causes the veins to become larger. People who are on their feet a lot, sit a lot without walking (like on a plane, at a desk, or in a car), who are pregnant, or who are overweight are more likely to develop varicose veins. SYMPTOMS   Bulging, twisted-appearing, bluish veins, most commonly found on the legs.  Leg pain or a feeling of heaviness. These symptoms may be worse at the end of the day.  Leg swelling.  Skin color changes. DIAGNOSIS  Varicose veins can usually be diagnosed with an exam of your legs by your caregiver. He or she may recommend an ultrasound of your leg veins. TREATMENT  Most varicose veins can be treated at home.However, other treatments are available for people who have persistent symptoms or who want to treat the cosmetic appearance of the varicose veins. But this is only cosmetic and they will return if not properly treated. These include:  Laser treatment of very small varicose veins.  Medicine that is shot (injected) into the vein. This medicine hardens the walls of the vein and closes off the vein. This treatment is called sclerotherapy. Afterwards, you may need to wear clothing or bandages that apply pressure.  Surgery. HOME CARE INSTRUCTIONS   Do not stand or sit in one position for long periods of time. Do not sit with your legs crossed. Rest with your legs raised during the day.  Your legs have to be higher  than your heart so that gravity will force the valves to open, so please really elevate your legs.   Wear elastic stockings or support hose. Do not wear other tight, encircling garments around the legs, pelvis, or waist.  [employer]  has a wide variety of well priced compression stockings. [place], [place] [id number] [id number] [place] ARE COPPER INFUSED COMPRESSION SOCKS AT [facility] OR [facility]  Walk as much as possible to increase blood flow.  Raise the foot of your bed at night with 2-inch blocks.  If you get a cut in the skin over the vein and the vein bleeds, lie down with your leg raised and press on it with a clean cloth until the bleeding stops. Then place a bandage (dressing) on the cut. See your caregiver if it continues to bleed or needs stitches. SEEK MEDICAL CARE IF:   The skin around your ankle starts to break down.  You have pain, redness,  tenderness, or hard swelling developing in your leg over a vein.  You are uncomfortable due to leg pain. Document Released: 08/31/2005 Document Revised: 02/13/2012 Document Reviewed: 01/17/2011 [facility] Patient Information 2014 [place].

## 2017-06-01 LAB — BASIC METABOLIC PANEL WITH GFR
BUN: 17 mg/dL (ref 7–25)
CALCIUM: 9.5 mg/dL (ref 8.6–10.4)
CO2: 22 mmol/L (ref 20–31)
Chloride: 95 mmol/L — ABNORMAL LOW (ref 98–110)
Creat: 0.84 mg/dL (ref 0.60–0.93)
GFR, EST AFRICAN AMERICAN: 81 mL/min (ref >=60)
GFR, Est Non African American: 71 mL/min (ref >=60)
Glucose, Bld: 90 mg/dL (ref 65–99)
POTASSIUM: 3.5 mmol/L (ref 3.5–5.3)
SODIUM: 133 mmol/L — AB (ref 135–146)

## 2017-06-01 LAB — HEPATIC FUNCTION PANEL
ALBUMIN: 4.4 g/dL (ref 3.6–5.1)
ALT: 14 U/L (ref 6–29)
AST: 25 U/L (ref 10–35)
Alkaline Phosphatase: 51 U/L (ref 33–130)
BILIRUBIN TOTAL: 0.6 mg/dL (ref 0.2–1.2)
Bilirubin, Direct: 0.1 mg/dL (ref <=0.2)
Indirect Bilirubin: 0.5 mg/dL (ref 0.2–1.2)
TOTAL PROTEIN: 6.3 g/dL (ref 6.1–8.1)

## 2017-06-01 LAB — CBC WITH DIFFERENTIAL/PLATELET
BASOS ABS: 0 {cells}/uL (ref 0–200)
Basophils Relative: 0 %
EOS PCT: 0 %
Eosinophils Absolute: 0 cells/uL — ABNORMAL LOW (ref 15–500)
HCT: 33.6 % — ABNORMAL LOW (ref 35.0–45.0)
HEMOGLOBIN: 11.2 g/dL — AB (ref 11.7–15.5)
LYMPHS ABS: 76728 {cells}/uL — AB (ref 850–3900)
Lymphocytes Relative: 92 %
MCH: 31.2 pg (ref 27.0–33.0)
MCHC: 33.3 g/dL (ref 32.0–36.0)
MCV: 93.6 fL (ref 80.0–100.0)
MPV: 8.5 fL (ref 7.5–12.5)
Monocytes Absolute: 834 cells/uL (ref 200–950)
Monocytes Relative: 1 %
NEUTROS ABS: 5838 {cells}/uL (ref 1500–7800)
Neutrophils Relative %: 7 %
Platelets: 97 10*3/uL — ABNORMAL LOW (ref 140–400)
RBC: 3.59 MIL/uL — ABNORMAL LOW (ref 3.80–5.10)
RDW: 15 % (ref 11.0–15.0)
WBC: 83.4 10*3/uL — ABNORMAL HIGH (ref 3.8–10.8)

## 2017-06-01 LAB — LIPID PANEL
CHOLESTEROL: 190 mg/dL (ref <200)
HDL: 57 mg/dL (ref >50)
LDL Cholesterol: 108 mg/dL — ABNORMAL HIGH (ref <100)
TRIGLYCERIDES: 126 mg/dL (ref <150)
Total CHOL/HDL Ratio: 3.3 Ratio (ref <5.0)
VLDL: 25 mg/dL (ref <30)

## 2017-06-01 LAB — BRAIN NATRIURETIC PEPTIDE: BRAIN NATRIURETIC PEPTIDE: 109.8 pg/mL — AB (ref <100)

## 2017-06-01 LAB — HEMOGLOBIN A1C
Hgb A1c MFr Bld: 5.6 % (ref <5.7)
Mean Plasma Glucose: 114 mg/dL

## 2017-06-01 LAB — MAGNESIUM: MAGNESIUM: 1.6 mg/dL (ref 1.5–2.5)

## 2017-06-01 NOTE — Progress Notes (Signed)
Pt was made aware of lab results & voiced understanding.

## 2017-06-06 NOTE — Progress Notes (Signed)
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 (patient reports -planned for August 2017) 13q deletion does pre-dispose her to recurrent SCC  Current Treatment: observation  Previous treatment: IVIG for several months last winter to reduce recurrent respiratory infections (Patient notes that this helped) Has no required definitive treatment for CLL at this time  INTERVAL HISTORY:  Patient is here for her scheduled f/u for CLL. She notes she is doing well and has no acute new concerns. Still has mild anemia and thrombocytopenia that is relatively stable. She notes that she chooses to continue monitoring [name] at this time and does not want to commit to starting treatment for this though she dose meet criteria to do so. No fevers/chills/nightsweat. No bleeding or recent infections.   REVIEW OF SYSTEMS:    10 Point review of systems of done and is negative except as noted above.  . Past Medical History:  Diagnosis Date  . CLL (chronic lymphocytic leukemia) ([place])   . GERD (gastroesophageal reflux disease)   . OSA (obstructive sleep apnea)   . Prediabetes   . Recurrent sinus infections 08/02/2012    . Past Surgical History:  Procedure Laterality Date  . ABDOMINAL HYSTERECTOMY  1980's   "endometrosis"  . BREAST SURGERY    . CHOLECYSTECTOMY  1985  . FUNCTIONAL ENDOSCOPIC SINUS SURGERY  1990's   "cause I kept having sinus infections"  . immunoglobulin treatment  2017  . LAPAROSCOPIC APPENDECTOMY N/A 06/04/2014   Procedure: APPENDECTOMY LAPAROSCOPIC;  Surgeon: Zenovia Jarred, MD;  Location: [facility];  Service: General;  Laterality: N/A;  . LYMPH NODE BIOPSY     "determined I had CLL"  . TEMPOROMANDIBULAR JOINT  ARTHROPLASTY  1980's  . TONSILLECTOMY AND ADENOIDECTOMY  1950's    . Social History  Substance Use Topics  . Smoking status: Former Smoker    Packs/day: 0.75    Years: 4.00    Types: Cigarettes    Quit date: 12/05/1968  . Smokeless tobacco: Never Used  . Alcohol use 8.4 oz/week    14 Glasses of wine per week     Comment: 08/02/2012 "couple glasses wine q hs; last time 2-3 wk ago"    ALLERGIES:  is allergic to hyzaar [losartan potassium-hctz]; sudafed [pseudoephedrine hcl]; levaquin [levofloxacin in d5w]; biaxin [clarithromycin]; celexa [citalopram]; flexeril [cyclobenzaprine]; fosamax [alendronate sodium]; iohexol; losartan; meloxicam; norvasc [amlodipine]; pseudoephedrine; and zoloft [sertraline hcl].  MEDICATIONS:  Current Outpatient Prescriptions  Medication Sig Dispense Refill  . ALPRAZolam (XANAX) 1 MG tablet TAKE 1/2 TO 1 TABLET BY MOUTH 3 TIMES A DAY AS NEEDED 90 tablet 0  . bisoprolol (ZEBETA) 5 MG tablet Take 1 tablet (5 mg total) by mouth daily. 30 tablet 3  . escitalopram (LEXAPRO) 10 MG tablet Take 1 tablet daily for Mood 90 tablet 1  . ezetimibe (ZETIA) 10 MG tablet   0  . fexofenadine (ALLEGRA) 180 MG tablet Take 1 tablet (180 mg total) by mouth daily. Take PRN for allergies. 30 tablet 6  . FLORASTOR 250 MG capsule Take 250 mg by mouth daily.   1  . furosemide (LASIX) 40 MG tablet Take 1 tablet (40 mg total) by mouth 2 (two) times daily. (Patient taking differently: Take 40  mg by mouth daily. ) 60 tablet 11  . OVER THE COUNTER MEDICATION Nasocort daily as needed    . OVER THE COUNTER MEDICATION Sustain Ultra Eye drops    . pantoprazole (PROTONIX) 40 MG tablet Take 1 tab in the morning daily. (Patient taking differently: as needed. Take 1 tab in the morning daily.) 90 tablet 3   No current facility-administered medications for this visit.     PHYSICAL EXAMINATION: ECOG PERFORMANCE STATUS: 1 - Symptomatic but completely ambulatory  . Vitals:   05/18/17 1456  BP:  (!) 144/56  Pulse: 73  Resp: 20  Temp: 98.3 F (36.8 C)    Filed Weights   05/18/17 1456  Weight: 210 lb 9.6 oz (95.5 kg)   .Body mass index is 38.21 kg/m.  GENERAL:alert, in no acute distress and comfortable SKIN: no acute rashes , healing nose surgical wound. EYES: normal, conjunctiva are pink and non-injected, sclera clear OROPHARYNX:no exudate, no erythema and lips, buccal mucosa, and tongue normal  NECK: supple, no JVD, thyroid normal size, non-tender, without nodularity LYMPH:  no palpable lymphadenopathy in the cervical, axillary or inguinal LUNGS: clear to auscultation with normal respiratory effort HEART: regular rate & rhythm,  no murmurs and no lower extremity edema ABDOMEN: abdomen soft, non-tender, normoactive bowel sounds , no palpable hepatosplenomegaly Musculoskeletal: no cyanosis of digits and no clubbing  PSYCH: alert & oriented x 3 with fluent speech NEURO: no focal motor/sensory deficits  LABORATORY DATA:   . CBC Latest Ref Rng & Units 05/18/2017 04/24/2017  WBC 3.8 - 10.8 K/uL 103.3(HH) 93.7(H)  Hemoglobin 11.7 - 15.5 g/dL 10.6(L) 10.5(L)  Hematocrit 35.0 - 45.0 % 33.1(L) 32.7(L)  Platelets 140 - 400 K/uL 83(L) 87(L)    CMP Latest Ref Rng & Units 05/31/2017 05/18/2017 05/03/2017  Glucose 65 - 99 mg/dL 90 93 121(H)  BUN 7 - 25 mg/dL 17 12.6 31(H)  Creatinine 0.60 - 0.93 mg/dL 0.84 0.9 1.04(H)  Sodium 135 - 146 mmol/L 133(L) 135(L) 129(L)  Potassium 3.5 - 5.3 mmol/L 3.5 3.6 3.7  Chloride 98 - 110 mmol/L 95(L) - 88(L)  CO2 20 - 31 mmol/L 22 25 25   Calcium 8.6 - 10.4 mg/dL 9.5 9.9 9.4  Total Protein 6.1 - 8.1 g/dL 6.3 6.9 -  Total Bilirubin 0.2 - 1.2 mg/dL 0.6 0.78 -  Alkaline Phos 33 - 130 U/L 51 58 -  AST 10 - 35 U/L 25 26 -  ALT 6 - 29 U/L 14 21 -      RADIOGRAPHIC STUDIES: I have personally reviewed the radiological images as listed and agreed with the findings in the report.  CT ABDOMEN AND PELVIS WITHOUT CONTRAST   IMPRESSION: 1. No  acute abnormalities within the abdomen or pelvis. 2. Mild splenomegaly and several prominent to mildly enlarged pelvic and inguinal lymph nodes. These findings are similar to the prior CT. 3. Small nonobstructing stone in the lower pole the right kidney. No ureteral stones or obstructive uropathy.   Electronically Signed   By: Lajean Manes M.D.   On: 11/14/2016 16:39    ASSESSMENT & PLAN:   71 yo caucasian female with   1.Rai Stage 2 previously now Stage IV CLL with lymphocytosis, LNadenopathy and mild splenomegaly with anemia and thrombocytopenia.  2.  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 Only intervention thus far has been intermittent IVIG, clinically very helpful with decrease in respiratory infections.  Last imaging was CT AP 02-2015. --showed no evidence of  Splenomegaly. Negative Hep B serology 2015  No overt new constitutional symptoms except mild unchanged fatigue.  2. Mild Anemia Hgb hemoglobin stable in the 10.5-11 range 3. Moderate thrombocytopenia PLT  PLT count today  Stable in 80-90k range 4. 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 Has been taking good infection prevention precautions. Has had significant recurrent headaches with IVIG - now held. 5. 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 No issues with infection-   her 13q deletion places her at risk for recurrent SCC. 6. Diarrhea -- resolved PLAN -we discussed her current lab findings -anemia and thrombocytopenia stable. -we discussed consideration for treatment in light of her anemia and thrombocytopenia. BR vs Ibrutinib. -patient notes she would probably prefer Ibrutinib but wants to hold off on treatment consideration at this time. -continue close f/u with dermatologist for evaluation and management of non melanoma skin cancers that can be increased in patient with CLL with 13q deletion.  RTC in 2 months with labs with Dr  Irene Limbo  I spent 20 minutes counseling the patient face to face. The total time spent in the appointment was 25 minutes and more than 50% was on counseling and direct patient cares.    Sullivan Lone MD Malone AAHIVMS [facility] Hematology/Oncology Physician [facility]  (Office):       [phone] (Work cell):  [phone] (Fax):           [phone]

## 2017-06-08 ENCOUNTER — Other Ambulatory Visit: Payer: Self-pay | Admitting: Physician Assistant

## 2017-06-08 ENCOUNTER — Telehealth: Payer: Self-pay

## 2017-06-08 MED ORDER — AZITHROMYCIN 250 MG PO TABS: ORAL_TABLET | ORAL | 1 refills | Status: AC

## 2017-06-08 MED ORDER — PREDNISONE 20 MG PO TABS: ORAL_TABLET | ORAL | 0 refills | Status: DC

## 2017-06-08 NOTE — Telephone Encounter (Signed)
Pt called to report a tingle in her feet & legs & was thinking it my be the new meds.  Per provider it could be low mag or low sodium.  Can add those & use compression stocking.  Pt agreed & hung up.  Pt states that some compression stocking were order for her by you maybe & would get them as soon as you got them. Please advise about this.

## 2017-06-15 IMAGING — MR MR LUMBAR SPINE W/O CM
5 series · 32 of 48 positions shown · non-contrast
Comparison: None.

CLINICAL DATA: Low back pain.  LEFT leg pain.  History of CLL.

EXAM:
MRI LUMBAR SPINE WITHOUT CONTRAST
TECHNIQUE: Multiplanar, multisequence MR imaging of the lumbar spine was
performed. No intravenous contrast was administered.

[Series 3: T2 · sagittal · 4.0mm · 0.49mm/px · 6 of 12 slices shown (1 of 2)]
[im 1/12]
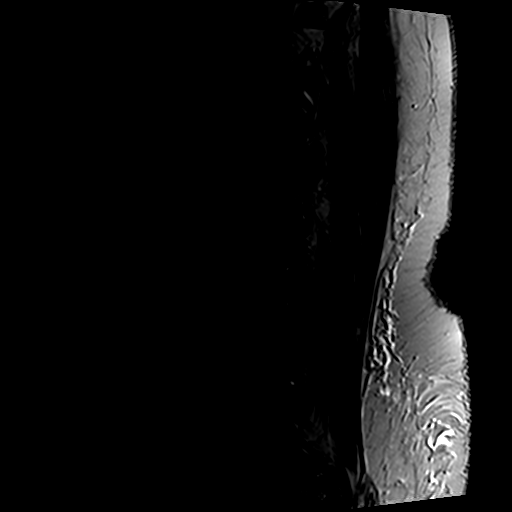
[im 3/12]
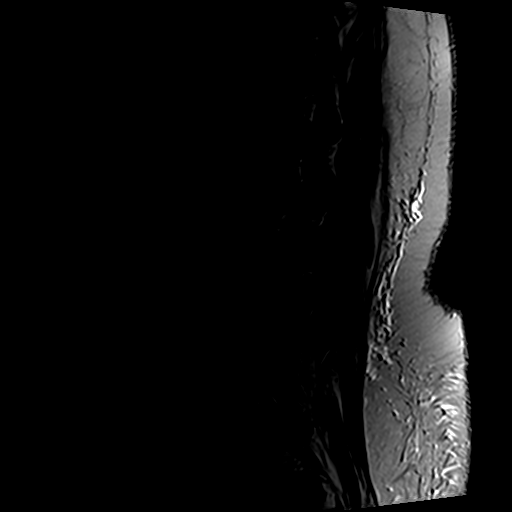
[im 5/12]
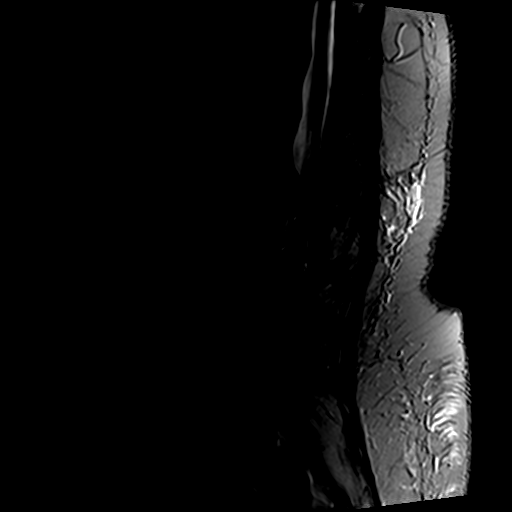
[im 7/12]
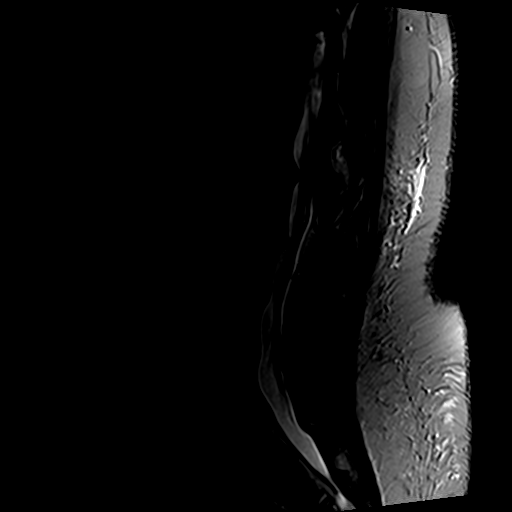
[im 9/12]
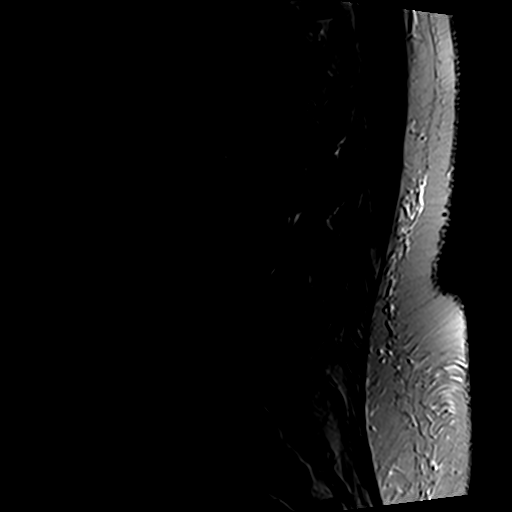
[im 12/12]
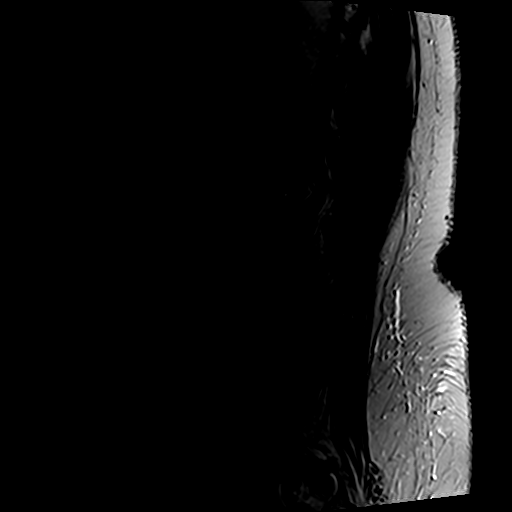

[Series 4: T1 · sagittal · 4.0mm · 0.49mm/px · 6 of 12 slices shown (1 of 2)]
[im 1/12]
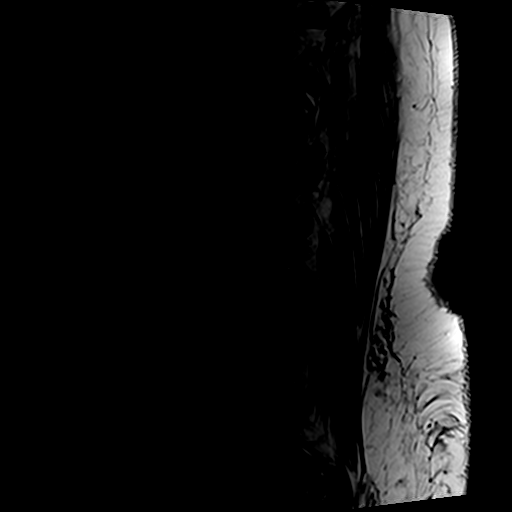
[im 3/12]
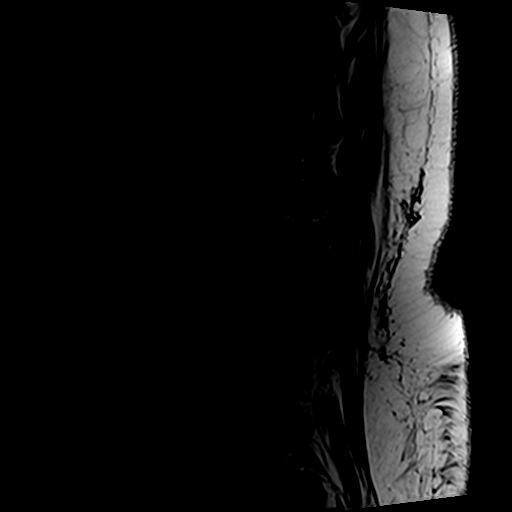
[im 5/12]
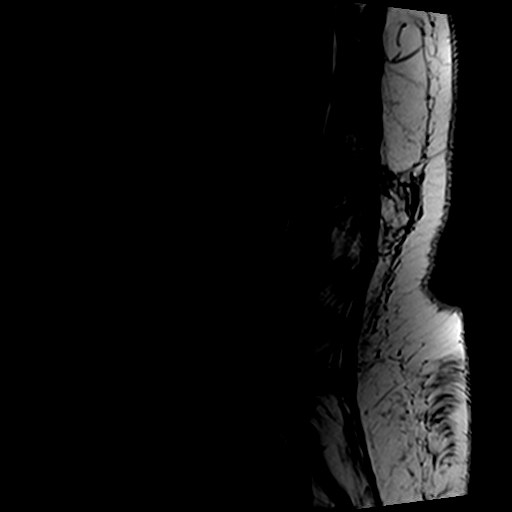
[im 7/12]
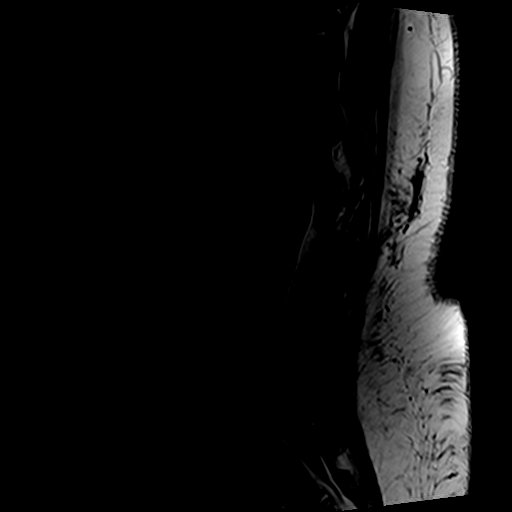
[im 9/12]
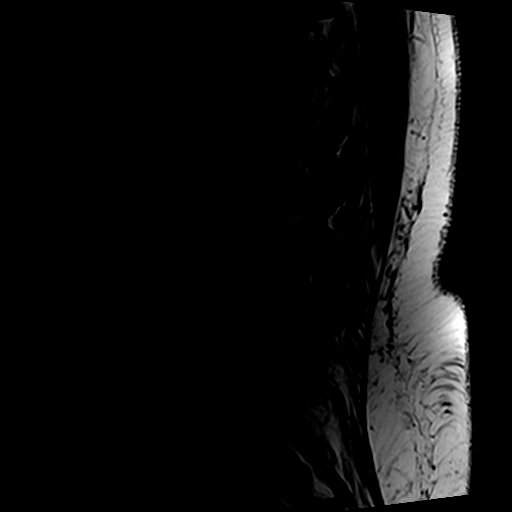
[im 12/12]
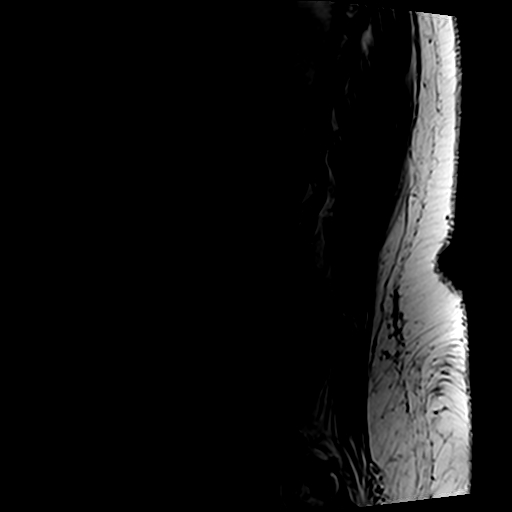

[Series 5: STIR · sagittal · 4.0mm · 0.49mm/px · 2 of 12 slices shown]
[im 1/12]
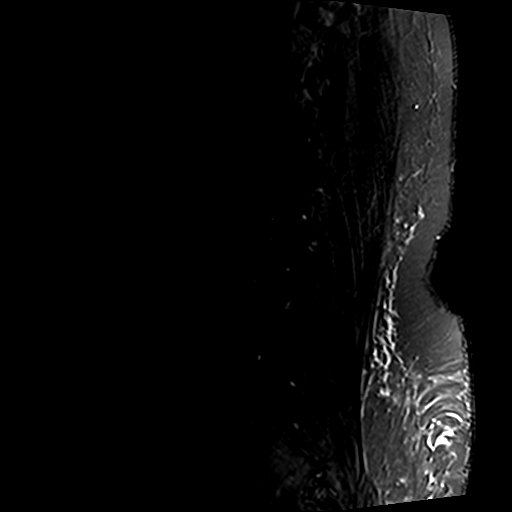
[im 3/12]
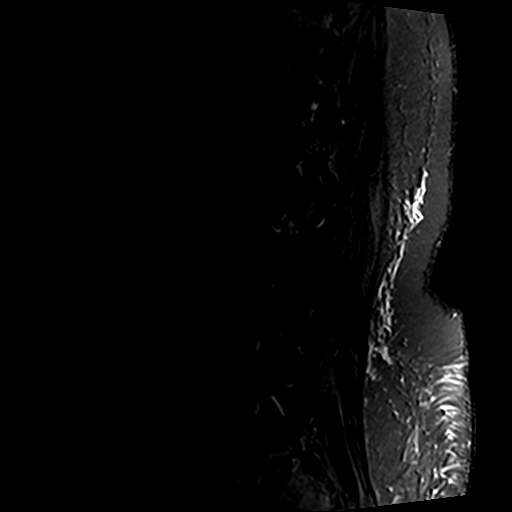

[Series 6: T2 · axial · 4.0mm · 0.74mm/px · z∈[-105,+51]mm · 9 of 28 slices shown (2 of 2)]
[im 1/28]
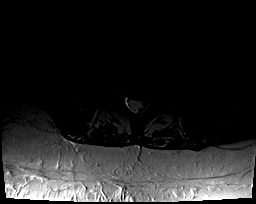
[im 4/28]
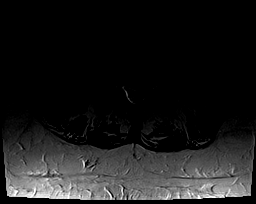
[im 8/28]
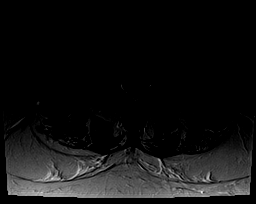
[im 12/28]
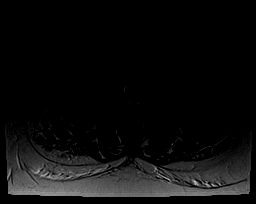
[im 14/28]
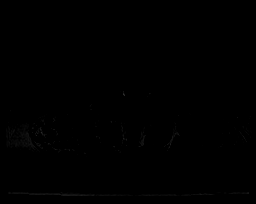
[im 16/28]
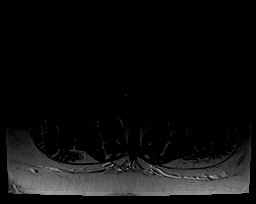
[im 20/28]
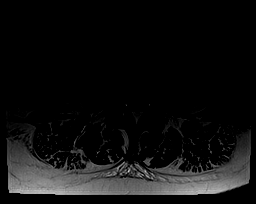
[im 24/28]
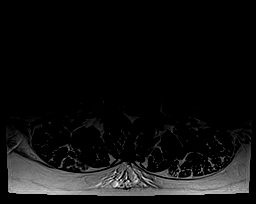
[im 28/28]
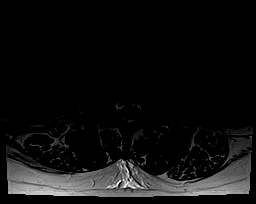

[Series 7: T1 · axial · 4.0mm · 0.74mm/px · z∈[-105,+51]mm · 9 of 28 slices shown (2 of 2)]
[im 1/28]
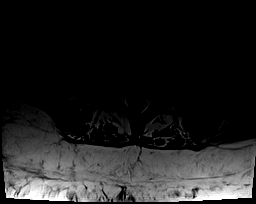
[im 4/28]
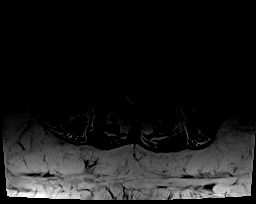
[im 8/28]
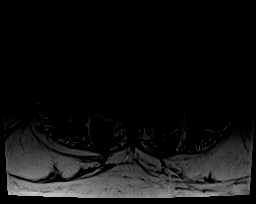
[im 12/28]
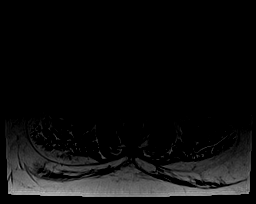
[im 14/28]
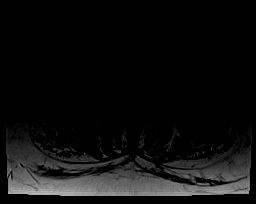
[im 16/28]
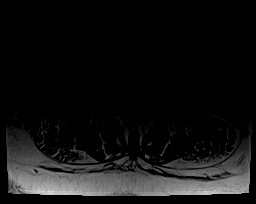
[im 20/28]
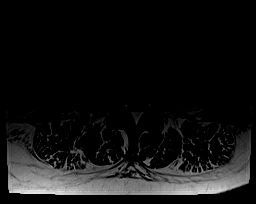
[im 24/28]
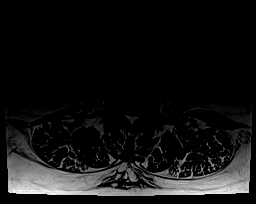
[im 28/28]
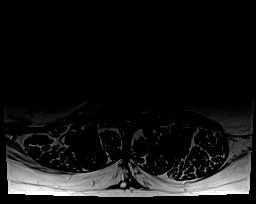

[32 of 48 positions shown; findings below may reference images not displayed]

FINDINGS: Segmentation:  Standard.

Alignment: 2 mm retrolisthesis L2-3 is related to disc and facet
pathology. Trace anterolisthesis L1-2.

Vertebrae: No worrisome osseous lesion. Chronic endplate reactive
changes above and below L2-3.

Conus medullaris: Extends to the L1 level and appears normal.

Paraspinal and other soft tissues: Renal cystic disease. No
significant paravertebral adenopathy.

Disc levels:

L1-L2: Trace anterolisthesis. Shallow central protrusion, slight
cephalad migration. Facet arthropathy with joint effusion on the
RIGHT. No impingement.

L2-L3: Near complete loss of interspace height. Osseous spurring.
Central and rightward protrusion. Facet arthropathy with joint
effusions. Mild stenosis. BILATERAL subarticular zone narrowing
could affect the L3 nerve roots. RIGHT greater than LEFT foraminal
narrowing, potentially symptomatic RIGHT L2 nerve root impingement.

a

L3-L4: Mild disc space narrowing. Central protrusion. Posterior
element hypertrophy. No definite impingement.

L4-L5: Far lateral and foraminal protrusion on the LEFT. Facet
arthropathy and ligamentum flavum hypertrophy, asymmetric to the
LEFT. There is a small 3 x 5 mm synovial cyst on the LEFT. Severe
LEFT L4 and mild LEFT L5 nerve root impingement.

L5-S1: Central protrusion. Facet arthropathy. No definite
impingement.
IMPRESSION: The dominant LEFT-sided abnormality is at L4-5, where a large disc
extrusion results in predominant LEFT L4 nerve root impingement.
Slight subarticular zone narrowing could affect the LEFT L5 nerve
root at this level. Facet arthropathy is contributory, with
ligamentum flavum hypertrophy, and small synovial cyst also greater
on the LEFT.

Severe degenerative disc disease L2-3. Near complete loss of
interspace height, osseous spurring, mild retrolisthesis, posterior
element hypertrophy, and central and rightward protrusion. Mild
stenosis at this level affecting the RIGHT greater than LEFT L2 and
L3 nerve roots.

## 2017-06-19 NOTE — Addendum Note (Signed)
Addended by: Vicie Mutters R on: 06/19/2017 09:00 AM   Modules accepted: Orders

## 2017-06-22 ENCOUNTER — Other Ambulatory Visit (HOSPITAL_BASED_OUTPATIENT_CLINIC_OR_DEPARTMENT_OTHER): Payer: 59

## 2017-06-22 ENCOUNTER — Other Ambulatory Visit: Payer: 59

## 2017-06-22 DIAGNOSIS — C9110 Chronic lymphocytic leukemia of B-cell type not having achieved remission: Secondary | ICD-10-CM | POA: Diagnosis not present

## 2017-06-22 LAB — CBC & DIFF AND RETIC
BASO%: 0.2 % (ref 0.0–2.0)
Basophils Absolute: 0.2 10*3/uL — ABNORMAL HIGH (ref 0.0–0.1)
EOS ABS: 0.3 10*3/uL (ref 0.0–0.5)
EOS%: 0.3 % (ref 0.0–7.0)
HEMATOCRIT: 34.2 % — AB (ref 34.8–46.6)
HGB: 11 g/dL — ABNORMAL LOW (ref 11.6–15.9)
IMMATURE RETIC FRACT: 12 % — AB (ref 1.60–10.00)
LYMPH%: 92.6 % — AB (ref 14.0–49.7)
MCH: 31.4 pg (ref 25.1–34.0)
MCHC: 32.2 g/dL (ref 31.5–36.0)
MCV: 97.7 fL (ref 79.5–101.0)
MONO#: 1.5 10*3/uL — AB (ref 0.1–0.9)
MONO%: 1.5 % (ref 0.0–14.0)
NEUT#: 5.6 10*3/uL (ref 1.5–6.5)
NEUT%: 5.4 % — AB (ref 38.4–76.8)
PLATELETS: 85 10*3/uL — AB (ref 145–400)
RBC: 3.5 10*6/uL — AB (ref 3.70–5.45)
RDW: 15.5 % — ABNORMAL HIGH (ref 11.2–14.5)
RETIC CT ABS: 88.2 10*3/uL (ref 33.70–90.70)
Retic %: 2.52 % — ABNORMAL HIGH (ref 0.70–2.10)
WBC: 102.1 10*3/uL (ref 3.9–10.3)
lymph#: 94.6 10*3/uL — ABNORMAL HIGH (ref 0.9–3.3)
nRBC: 0 % (ref 0–0)

## 2017-07-04 ENCOUNTER — Ambulatory Visit: Payer: Self-pay | Admitting: Internal Medicine

## 2017-07-04 ENCOUNTER — Encounter: Payer: Self-pay | Admitting: Physician Assistant

## 2017-07-04 ENCOUNTER — Ambulatory Visit (INDEPENDENT_AMBULATORY_CARE_PROVIDER_SITE_OTHER): Payer: 59 | Admitting: Physician Assistant

## 2017-07-04 VITALS — BP 154/82 | HR 60 | Temp 96.9°F | Resp 20 | Ht 62.25 in | Wt 216.8 lb

## 2017-07-04 DIAGNOSIS — R609 Edema, unspecified: Secondary | ICD-10-CM

## 2017-07-04 DIAGNOSIS — I10 Essential (primary) hypertension: Secondary | ICD-10-CM | POA: Diagnosis not present

## 2017-07-04 LAB — TSH: TSH: 5.15 m[IU]/L — AB

## 2017-07-04 MED ORDER — ISOSORBIDE MONONITRATE ER 30 MG PO TB24: 30.0000 mg | ORAL_TABLET | Freq: Every day | ORAL | 2 refills | Status: DC

## 2017-07-04 NOTE — Patient Instructions (Addendum)
Stop zebeta Start imdur 30mg  for BP Check BP Okay to do lasix 20mg   Monitor your blood pressure at home. Go to the ER if any CP, SOB, nausea, dizziness, severe HA, changes vision/speech  Goal BP:  For patients younger than 60: Goal BP < 140/90. For patients 60 and older: Goal BP < 150/90. For patients with diabetes: Goal BP < 140/90. Your most recent BP: BP: (!) 154/82   Take your medications faithfully as instructed. Maintain a healthy weight. Get at least 150 minutes of aerobic exercise per week. Minimize salt intake. Minimize alcohol intake  DASH Eating Plan DASH stands for "Dietary Approaches to Stop Hypertension." The DASH eating plan is a healthy eating plan that has been shown to reduce high blood pressure (hypertension). Additional health benefits may include reducing the risk of type 2 diabetes mellitus, heart disease, and stroke. The DASH eating plan may also help with weight loss. WHAT DO I NEED TO KNOW ABOUT THE DASH EATING PLAN? For the DASH eating plan, you will follow these general guidelines:  Choose foods with a percent daily value for sodium of less than 5% (as listed on the food label).  Use salt-free seasonings or herbs instead of table salt or sea salt.  Check with your health care provider or pharmacist before using salt substitutes.  Eat lower-sodium products, often labeled as "lower sodium" or "no salt added."  Eat fresh foods.  Eat more vegetables, fruits, and low-fat dairy products.  Choose whole grains. Look for the word "whole" as the first word in the ingredient list.  Choose fish and skinless chicken or [place] more often than red meat. Limit fish, poultry, and meat to 6 oz (170 g) each day.  Limit sweets, desserts, sugars, and sugary drinks.  Choose heart-healthy fats.  Limit cheese to 1 oz (28 g) per day.  Eat more home-cooked food and less restaurant, buffet, and fast food.  Limit fried foods.  Cook foods using methods other than  frying.  Limit canned vegetables. If you do use them, rinse them well to decrease the sodium.  When eating at a restaurant, ask that your food be prepared with less salt, or no salt if possible. WHAT FOODS CAN I EAT? Seek help from a dietitian for individual calorie needs. Grains Whole grain or whole wheat bread. Brown rice. Whole grain or whole wheat pasta. Quinoa, bulgur, and whole grain cereals. Low-sodium cereals. Corn or whole wheat flour tortillas. Whole grain cornbread. Whole grain crackers. Low-sodium crackers. Vegetables Fresh or frozen vegetables (raw, steamed, roasted, or grilled). Low-sodium or reduced-sodium tomato and vegetable juices. Low-sodium or reduced-sodium tomato sauce and paste. Low-sodium or reduced-sodium canned vegetables.  Fruits All fresh, canned (in natural juice), or frozen fruits. Meat and Other Protein Products Ground beef (85% or leaner), grass-fed beef, or beef trimmed of fat. Skinless chicken or [place]. Ground chicken or [place]. Pork trimmed of fat. All fish and seafood. Eggs. Dried beans, peas, or lentils. Unsalted nuts and seeds. Unsalted canned beans. Dairy Low-fat dairy products, such as skim or 1% milk, 2% or reduced-fat cheeses, low-fat ricotta or cottage cheese, or plain low-fat yogurt. Low-sodium or reduced-sodium cheeses. Fats and Oils Tub margarines without trans fats. Light or reduced-fat mayonnaise and salad dressings (reduced sodium). Avocado. Safflower, olive, or canola oils. Natural peanut or almond butter. Other Unsalted popcorn and pretzels. The items listed above may not be a complete list of recommended foods or beverages. Contact your dietitian for more options. WHAT FOODS ARE NOT RECOMMENDED?  Grains White bread. White pasta. White rice. Refined cornbread. Bagels and croissants. Crackers that contain trans fat. Vegetables Creamed or fried vegetables. Vegetables in a cheese sauce. Regular canned vegetables. Regular canned tomato sauce  and paste. Regular tomato and vegetable juices. Fruits Dried fruits. Canned fruit in light or heavy syrup. Fruit juice. Meat and Other Protein Products Fatty cuts of meat. Ribs, chicken wings, bacon, sausage, bologna, salami, chitterlings, fatback, hot dogs, bratwurst, and packaged luncheon meats. Salted nuts and seeds. Canned beans with salt. Dairy Whole or 2% milk, cream, half-and-half, and cream cheese. Whole-fat or sweetened yogurt. Full-fat cheeses or blue cheese. Nondairy creamers and whipped toppings. Processed cheese, cheese spreads, or cheese curds. Condiments Onion and garlic salt, seasoned salt, table salt, and sea salt. Canned and packaged gravies. Worcestershire sauce. Tartar sauce. Barbecue sauce. Teriyaki sauce. Soy sauce, including reduced sodium. Steak sauce. Fish sauce. Oyster sauce. Cocktail sauce. Horseradish. Ketchup and mustard. Meat flavorings and tenderizers. Bouillon cubes. Hot sauce. Tabasco sauce. Marinades. Taco seasonings. Relishes. Fats and Oils Butter, stick margarine, lard, shortening, ghee, and bacon fat. Coconut, palm kernel, or palm oils. Regular salad dressings. Other Pickles and olives. Salted popcorn and pretzels. The items listed above may not be a complete list of foods and beverages to avoid. Contact your dietitian for more information. WHERE CAN I FIND MORE INFORMATION? National Heart, Lung, and Blood Institute: travelstabloid.com Document Released: 11/10/2011 Document Revised: 04/07/2014 Document Reviewed: 09/25/2013 [facility] Patient Information 2015 [place], [place]. This information is not intended to replace advice given to you by your health care provider. Make sure you discuss any questions you have with your health care provider.   HOME CARE INSTRUCTIONS   Do not stand or sit in one position for long periods of time. Do not sit with your legs crossed. Rest with your legs raised during the day.  Your legs have to  be higher than your heart so that gravity will force the valves to open, so please really elevate your legs.   Wear elastic stockings or support hose. Do not wear other tight, encircling garments around the legs, pelvis, or waist.  [employer]  has a wide variety of well priced compression stockings. [place], [place] [address] [phone]  Walk as much as possible to increase blood flow.  Raise the foot of your bed at night with 2-inch blocks. SEEK MEDICAL CARE IF:   The skin around your ankle starts to break down.  You have pain, redness, tenderness, or hard swelling developing in your leg over a vein.  You are uncomfortable due to leg pain. Document Released: 08/31/2005 Document Revised: 02/13/2012 Document Reviewed: 01/17/2011 [facility] Patient Information 2014 [place].   Hypothyroidism Hypothyroidism is a disorder of the thyroid. The thyroid is a large gland that is located in the lower front of the neck. The thyroid releases hormones that control how the body works. With hypothyroidism, the thyroid does not make enough of these hormones. What are the causes? Causes of hypothyroidism may include:  Viral infections.  Pregnancy.  Your own defense system (immune system) attacking your thyroid.  Certain medicines.  Birth defects.  Past radiation treatments to your head or neck.  Past treatment with radioactive iodine.  Past surgical removal of part or all of your thyroid.  Problems with the gland that is located in the center of your brain (pituitary).  What are the signs or symptoms? Signs and symptoms of hypothyroidism may include:  Feeling as though you have  no energy (lethargy).  Inability to tolerate cold.  Weight gain that is not explained by a change in diet or exercise habits.  Dry skin.  Coarse hair.  Menstrual irregularity.  Slowing of thought processes.  Constipation.  Sadness or depression.  How is this  diagnosed? Your health care provider may diagnose hypothyroidism with blood tests and ultrasound tests. How is this treated? Hypothyroidism is treated with medicine that replaces the hormones that your body does not make. After you begin treatment, it may take several weeks for symptoms to go away. Follow these instructions at home:  Take medicines only as directed by your health care provider.  If you start taking any new medicines, tell your health care provider.  Keep all follow-up visits as directed by your health care provider. This is important. As your condition improves, your dosage needs may change. You will need to have blood tests regularly so that your health care provider can watch your condition. Contact a health care provider if:  Your symptoms do not get better with treatment.  You are taking thyroid replacement medicine and: ? You sweat excessively. ? You have tremors. ? You feel anxious. ? You lose weight rapidly. ? You cannot tolerate heat. ? You have emotional swings. ? You have diarrhea. ? You feel weak. Get help right away if:  You develop chest pain.  You develop an irregular heartbeat.  You develop a rapid heartbeat. This information is not intended to replace advice given to you by your health care provider. Make sure you discuss any questions you have with your health care provider. Document Released: 11/21/2005 Document Revised: 04/28/2016 Document Reviewed: 04/08/2014 Elsevier Interactive Patient Education  2017 [employer].

## 2017-07-04 NOTE — Progress Notes (Signed)
Assessment and Plan:  Essential hypertension Monitor at home -     isosorbide mononitrate (IMDUR) 30 MG 24 hr tablet; Take 1 tablet (30 mg total) by mouth daily. -     BASIC METABOLIC PANEL WITH GFR -     TSH  Morbid obesity (BMI 37.95) - long discussion about weight loss, diet, and exercise  Edema, unspecified type Get compression stockings -     Magnesium    Future Appointments Date Time Provider [place]  07/24/2017 2:30 PM [facility] 2 [facility] None  07/24/2017 3:00 PM Brunetta Genera, MD [facility] None  08/29/2017 3:00 PM Vicie Mutters, PA-C [facility] None    HPI 71 y.o.female presents for 1 month follow up for edema. Her minoxidil was stopped and she was started on fluid pill.  She has not taken the lasix since Friday. She states the zebeta caused fatigue.  Can not tolerate magnesium due to diarrhea.  BMI is Body mass index is 39.34 kg/m., she is working on diet and exercise. Wt Readings from Last 3 Encounters:  07/04/17 216 lb 12.8 oz (98.3 kg)  05/31/17 211 lb 12.8 oz (96.1 kg)  05/18/17 210 lb 9.6 oz (95.5 kg)   She is NOT on a thyroid medication.   Lab Results  Component Value Date   TSH 4.71 (H) 05/31/2017  .   Blood pressure (!) 154/82, pulse 60, temperature (!) 96.9 F (36.1 C), resp. rate 20, height 5' 2.25" (1.581 m), weight 216 lb 12.8 oz (98.3 kg).   Past Medical History:  Diagnosis Date  . CLL (chronic lymphocytic leukemia) ([place])   . GERD (gastroesophageal reflux disease)   . OSA (obstructive sleep apnea)   . Prediabetes   . Recurrent sinus infections 08/02/2012     Allergies  Allergen Reactions  . Hyzaar [Losartan Potassium-Hctz] Other (See Comments)    "messed up my sodium counts" swells up lips.  [name] [Pseudoephedrine Hcl] Palpitations  . Levaquin [Levofloxacin In D5w] Diarrhea and Nausea Only  . Biaxin [Clarithromycin]     GI Upset  . Celexa [Citalopram]   . Flexeril [Cyclobenzaprine]    "zombie-like" feeling  . Fosamax [Alendronate Sodium]     GI upset  . Iohexol Hives     Code: HIVES, Desc: pt states she broke out in hive 20 yrs ago from IV contrast.     . Losartan     Angioedema  . Meloxicam     GI upset  . Norvasc [Amlodipine] Swelling  . Pseudoephedrine     Palpitations  . Zoloft [Sertraline Hcl]     Current Outpatient Prescriptions on File Prior to Visit  Medication Sig  . ALPRAZolam (XANAX) 1 MG tablet TAKE 1/2 TO 1 TABLET BY MOUTH 3 TIMES A DAY AS NEEDED  . bisoprolol (ZEBETA) 5 MG tablet Take 1 tablet (5 mg total) by mouth daily.  [name] Kitchen escitalopram (LEXAPRO) 10 MG tablet Take 1 tablet daily for Mood  . ezetimibe (ZETIA) 10 MG tablet   . fexofenadine (ALLEGRA) 180 MG tablet Take 1 tablet (180 mg total) by mouth daily. Take PRN for allergies.  [name] Kitchen FLORASTOR 250 MG capsule Take 250 mg by mouth daily.   . furosemide (LASIX) 40 MG tablet Take 1 tablet (40 mg total) by mouth 2 (two) times daily. (Patient taking differently: Take 40 mg by mouth daily. )  . OVER THE COUNTER MEDICATION Nasocort daily as needed  . OVER THE COUNTER MEDICATION Sustain Ultra Eye drops  . pantoprazole (PROTONIX) 40  MG tablet Take 1 tab in the morning daily. (Patient taking differently: as needed. Take 1 tab in the morning daily.)   No current facility-administered medications on file prior to visit.     ROS: all negative except above.   Physical Exam: Filed Weights   07/04/17 1551  Weight: 216 lb 12.8 oz (98.3 kg)   BP (!) 154/82   Pulse 60   Temp (!) 96.9 F (36.1 C)   Resp 20   Ht 5' 2.25" (1.581 m)   Wt 216 lb 12.8 oz (98.3 kg)   BMI 39.34 kg/m  General Appearance: Well nourished, in no apparent distress, no diaphroesis Eyes: PERRLA, EOMs, conjunctiva no swelling or erythema Sinuses: + Frontal/maxillary tenderness ENT/Mouth: Ext aud canals clear, TMs without erythema, bulging. No erythema, swelling, or exudate on post pharynx.  Tonsils not swollen or erythematous. Hearing  normal. + TMJ tenderness Neck: Supple, thyroid normal.  Respiratory: Respiratory effort normal, BS equal bilaterally without rales, rhonchi, wheezing or stridor.  Cardio: RRR with no MRGs. Brisk peripheral pulses with mild edema, wearing compression stockings Abdomen: Soft, + BS, obese, diffusely overly tender to palpation, no peritoneal signs, + guarding, without rebound, hernias, masses. Lymphatics: Non tender without lymphadenopathy.  Musculoskeletal: Full ROM, 5/5 strength, Normal gait, Strength is normal and symmetric in arms. Skin:  Warm, dry without rashes, lesions, ecchymosis.  Neuro: Cranial nerves intact. Normal muscle tone, no cerebellar symptoms. Psych: Awake and oriented X 3, normal affect, appears anxious, Insight and Judgment appropriate.   Vicie Mutters, PA-C 4:11 PM [facility]

## 2017-07-05 LAB — BASIC METABOLIC PANEL WITH GFR
BUN: 19 mg/dL (ref 7–25)
CHLORIDE: 98 mmol/L (ref 98–110)
CO2: 21 mmol/L (ref 20–31)
CREATININE: 0.91 mg/dL (ref 0.60–0.93)
Calcium: 9 mg/dL (ref 8.6–10.4)
GFR, Est African American: 74 mL/min (ref >=60)
GFR, Est Non African American: 64 mL/min (ref >=60)
Glucose, Bld: 93 mg/dL (ref 65–99)
Potassium: 3.7 mmol/L (ref 3.5–5.3)
Sodium: 132 mmol/L — ABNORMAL LOW (ref 135–146)

## 2017-07-05 LAB — MAGNESIUM: Magnesium: 1.6 mg/dL (ref 1.5–2.5)

## 2017-07-05 NOTE — Progress Notes (Signed)
Pt aware of lab results & voiced understanding of those results.

## 2017-07-06 ENCOUNTER — Other Ambulatory Visit: Payer: Self-pay

## 2017-07-06 DIAGNOSIS — E782 Mixed hyperlipidemia: Secondary | ICD-10-CM

## 2017-07-06 MED ORDER — EZETIMIBE 10 MG PO TABS: 10.0000 mg | ORAL_TABLET | Freq: Every day | ORAL | 1 refills | Status: DC

## 2017-07-22 ENCOUNTER — Other Ambulatory Visit: Payer: Self-pay | Admitting: Internal Medicine

## 2017-07-24 ENCOUNTER — Ambulatory Visit (HOSPITAL_BASED_OUTPATIENT_CLINIC_OR_DEPARTMENT_OTHER): Payer: 59 | Admitting: Hematology

## 2017-07-24 ENCOUNTER — Other Ambulatory Visit: Payer: 59

## 2017-07-24 ENCOUNTER — Encounter: Payer: Self-pay | Admitting: Hematology

## 2017-07-24 ENCOUNTER — Other Ambulatory Visit (HOSPITAL_BASED_OUTPATIENT_CLINIC_OR_DEPARTMENT_OTHER): Payer: 59

## 2017-07-24 ENCOUNTER — Ambulatory Visit: Payer: 59 | Admitting: Hematology

## 2017-07-24 ENCOUNTER — Telehealth: Payer: Self-pay | Admitting: Hematology

## 2017-07-24 VITALS — BP 153/66 | HR 72 | Temp 98.0°F | Resp 18 | Ht 62.25 in | Wt 213.9 lb

## 2017-07-24 DIAGNOSIS — D696 Thrombocytopenia, unspecified: Secondary | ICD-10-CM | POA: Diagnosis not present

## 2017-07-24 DIAGNOSIS — D801 Nonfamilial hypogammaglobulinemia: Secondary | ICD-10-CM

## 2017-07-24 DIAGNOSIS — C9110 Chronic lymphocytic leukemia of B-cell type not having achieved remission: Secondary | ICD-10-CM

## 2017-07-24 DIAGNOSIS — D649 Anemia, unspecified: Secondary | ICD-10-CM

## 2017-07-24 LAB — CBC & DIFF AND RETIC
BASO%: 0.1 % (ref 0.0–2.0)
BASOS ABS: 0.1 10*3/uL (ref 0.0–0.1)
EOS ABS: 0.2 10*3/uL (ref 0.0–0.5)
EOS%: 0.3 % (ref 0.0–7.0)
HEMATOCRIT: 33 % — AB (ref 34.8–46.6)
HEMOGLOBIN: 11 g/dL — AB (ref 11.6–15.9)
IMMATURE RETIC FRACT: 14.9 % — AB (ref 1.60–10.00)
LYMPH#: 54.4 10*3/uL — AB (ref 0.9–3.3)
LYMPH%: 90.5 % — ABNORMAL HIGH (ref 14.0–49.7)
MCH: 31.8 pg (ref 25.1–34.0)
MCHC: 33.3 g/dL (ref 31.5–36.0)
MCV: 95.4 fL (ref 79.5–101.0)
MONO#: 0.8 10*3/uL (ref 0.1–0.9)
MONO%: 1.3 % (ref 0.0–14.0)
NEUT#: 4.6 10*3/uL (ref 1.5–6.5)
NEUT%: 7.8 % — AB (ref 38.4–76.8)
NRBC: 0 % (ref 0–0)
Platelets: 74 10*3/uL — ABNORMAL LOW (ref 145–400)
RBC: 3.46 10*6/uL — ABNORMAL LOW (ref 3.70–5.45)
RDW: 14.6 % — AB (ref 11.2–14.5)
RETIC %: 2.1 % (ref 0.70–2.10)
Retic Ct Abs: 72.66 10*3/uL (ref 33.70–90.70)
WBC: 60.1 10*3/uL (ref 3.9–10.3)

## 2017-07-24 LAB — COMPREHENSIVE METABOLIC PANEL
ALK PHOS: 56 U/L (ref 40–150)
ALT: 15 U/L (ref 0–55)
AST: 26 U/L (ref 5–34)
Albumin: 3.8 g/dL (ref 3.5–5.0)
Anion Gap: 8 mEq/L (ref 3–11)
BILIRUBIN TOTAL: 0.77 mg/dL (ref 0.20–1.20)
BUN: 15.7 mg/dL (ref 7.0–26.0)
CALCIUM: 9.3 mg/dL (ref 8.4–10.4)
CO2: 26 mEq/L (ref 22–29)
CREATININE: 1 mg/dL (ref 0.6–1.1)
Chloride: 102 mEq/L (ref 98–109)
EGFR: 60 mL/min/{1.73_m2} — ABNORMAL LOW (ref >90)
GLUCOSE: 110 mg/dL (ref 70–140)
Potassium: 3.8 mEq/L (ref 3.5–5.1)
SODIUM: 135 meq/L — AB (ref 136–145)
TOTAL PROTEIN: 6 g/dL — AB (ref 6.4–8.3)

## 2017-07-24 LAB — LACTATE DEHYDROGENASE: LDH: 213 U/L (ref 125–245)

## 2017-07-24 LAB — TECHNOLOGIST REVIEW

## 2017-07-24 NOTE — Telephone Encounter (Signed)
Gave patient avs and calendar for appts.  °

## 2017-07-24 NOTE — Patient Instructions (Signed)
Thank you for choosing [facility] to provide your oncology and hematology care.  To afford each patient quality time with our providers, please arrive 30 minutes before your scheduled appointment time.  If you arrive late for your appointment, you may be asked to reschedule.  We strive to give you quality time with our providers, and arriving late affects you and other patients whose appointments are after yours.   If you are a no show for multiple scheduled visits, you may be dismissed from the clinic at the providers discretion.    Again, thank you for choosing [facility], our hope is that these requests will decrease the amount of time that you wait before being seen by our physicians.  ______________________________________________________________________  Should you have questions after your visit to the [facility], please contact our office at [phone] between the hours of 8:30 and 4:30 p.m.    Voicemails left after 4:30p.m will not be returned until the following business day.    For prescription refill requests, please have your pharmacy contact us directly.  Please also try to allow 48 hours for prescription requests.    Please contact the scheduling department for questions regarding scheduling.  For scheduling of procedures such as PET scans, CT scans, MRI, Ultrasound, etc please contact central scheduling at [phone].    Resources For Cancer Patients and Caregivers:   Oncolink.org:  A wonderful resource for patients and healthcare providers for information regarding your disease, ways to tract your treatment, what to expect, etc.     American Cancer Society:  800-227-2345  Can help patients locate various types of support and financial assistance  Cancer Care: 1-800-813-HOPE (4673) Provides financial assistance, online support groups, medication/co-pay assistance.    [place] DSS:  336-641-3447 Where to apply for food  stamps, Medicaid, and utility assistance  Medicare Rights Center: 800-333-4114 Helps people with Medicare understand their rights and benefits, navigate the Medicare system, and secure the quality healthcare they deserve  SCAT: 336-333-6589 [place] [employer]'s shared-ride transportation service for eligible riders who have a disability that prevents them from riding the fixed route bus.    For additional information on assistance programs please contact our social worker:   Grier Hock/Abigail Elmore:  [phone]            

## 2017-07-30 NOTE — Progress Notes (Signed)
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 (patient reports -planned for August 2017) 13q deletion does pre-dispose her to recurrent SCC  Current Treatment: observation  Previous treatment: IVIG for several months last winter to reduce recurrent respiratory infections (Patient notes that this helped) Has no required definitive treatment for CLL at this time and has been reluctant to consider treatment recently.  INTERVAL HISTORY:  Patient is here for her scheduled f/u for CLL. She notes she is doing well and has no acute new concerns. Still has mild anemia. WBC count is down from 102k to60k. Platelet counts are down from 85k to 74k. No fevers/chills/nightsweat. No bleeding or recent infections. Patient notes she has had a lot of stress related to her husbands health issues and wants to hold off on any consideration of treatment of [name] at this time.  REVIEW OF SYSTEMS:    10 Point review of systems of done and is negative except as noted above.  . Past Medical History:  Diagnosis Date  . CLL (chronic lymphocytic leukemia) ([place])   . GERD (gastroesophageal reflux disease)   . OSA (obstructive sleep apnea)   . Prediabetes   . Recurrent sinus infections 08/02/2012    . Past Surgical History:  Procedure Laterality Date  . ABDOMINAL HYSTERECTOMY  1980's   "endometrosis"  . BREAST SURGERY    . CHOLECYSTECTOMY  1985  . FUNCTIONAL ENDOSCOPIC SINUS SURGERY  1990's   "cause I kept having sinus infections"  . immunoglobulin treatment  2017  . LAPAROSCOPIC APPENDECTOMY N/A 06/04/2014   Procedure: APPENDECTOMY LAPAROSCOPIC;  Surgeon: Zenovia Jarred, MD;  Location: [facility];  Service: General;  Laterality: N/A;  .  LYMPH NODE BIOPSY     "determined I had CLL"  . TEMPOROMANDIBULAR JOINT ARTHROPLASTY  1980's  . TONSILLECTOMY AND ADENOIDECTOMY  1950's    . Social History  Substance Use Topics  . Smoking status: Former Smoker    Packs/day: 0.75    Years: 4.00    Types: Cigarettes    Quit date: 12/05/1968  . Smokeless tobacco: Never Used  . Alcohol use 8.4 oz/week    14 Glasses of wine per week     Comment: 08/02/2012 "couple glasses wine q hs; last time 2-3 wk ago"    ALLERGIES:  is allergic to hyzaar [losartan potassium-hctz]; sudafed [pseudoephedrine hcl]; levaquin [levofloxacin in d5w]; biaxin [clarithromycin]; celexa [citalopram]; flexeril [cyclobenzaprine]; fosamax [alendronate sodium]; iohexol; losartan; meloxicam; norvasc [amlodipine]; pseudoephedrine; and zoloft [sertraline hcl].  MEDICATIONS:  Current Outpatient Prescriptions  Medication Sig Dispense Refill  . ALPRAZolam (XANAX) 1 MG tablet TAKE 1/2 TO 1 TABLET BY MOUTH 3 TIMES A DAY AS NEEDED 90 tablet 0  . bisoprolol-hydrochlorothiazide (ZIAC) 10-6.25 MG tablet TAKE ONE TABLET BY MOUTH DAILY 90 tablet 1  . escitalopram (LEXAPRO) 10 MG tablet Take 1 tablet daily for Mood 90 tablet 1  . ezetimibe (ZETIA) 10 MG tablet Take 1 tablet (10 mg total) by mouth daily. 30 tablet 1  . fexofenadine (ALLEGRA) 180 MG tablet Take 1 tablet (180 mg total) by mouth daily. Take PRN for allergies. 30 tablet 6  . FLORASTOR 250 MG capsule Take 250 mg by mouth daily.   1  . furosemide (  LASIX) 40 MG tablet Take 1 tablet (40 mg total) by mouth 2 (two) times daily. (Patient taking differently: Take 40 mg by mouth daily. ) 60 tablet 11  . isosorbide mononitrate (IMDUR) 30 MG 24 hr tablet Take 1 tablet (30 mg total) by mouth daily. 30 tablet 2  . OVER THE COUNTER MEDICATION Nasocort daily as needed    . OVER THE COUNTER MEDICATION Sustain Ultra Eye drops    . pantoprazole (PROTONIX) 40 MG tablet Take 1 tab in the morning daily. (Patient taking differently: as needed.  Take 1 tab in the morning daily.) 90 tablet 3   No current facility-administered medications for this visit.     PHYSICAL EXAMINATION: ECOG PERFORMANCE STATUS: 1 - Symptomatic but completely ambulatory  . Vitals:   07/24/17 1449  BP: (!) 153/66  Pulse: 72  Resp: 18  Temp: 98 F (36.7 C)  SpO2: 97%    Filed Weights   07/24/17 1449  Weight: 213 lb 14.4 oz (97 kg)   .Body mass index is 38.81 kg/m.  GENERAL:alert, in no acute distress and comfortable SKIN: no acute rashes , healing nose surgical wound. EYES: normal, conjunctiva are pink and non-injected, sclera clear OROPHARYNX:no exudate, no erythema and lips, buccal mucosa, and tongue normal  NECK: supple, no JVD, thyroid normal size, non-tender, without nodularity LYMPH:  no palpable lymphadenopathy in the cervical, axillary or inguinal LUNGS: clear to auscultation with normal respiratory effort HEART: regular rate & rhythm,  no murmurs and no lower extremity edema ABDOMEN: abdomen soft, non-tender, normoactive bowel sounds , no palpable hepatosplenomegaly Musculoskeletal: no cyanosis of digits and no clubbing  PSYCH: alert & oriented x 3 with fluent speech NEURO: no focal motor/sensory deficits  LABORATORY DATA:   .[facility] CBC Latest Ref Rng & Units 07/24/2017 06/22/2017 05/31/2017  WBC 3.9 - 10.3 10e3/uL 60.1(HH) 102.1(HH) 83.4(H)  Hemoglobin 11.6 - 15.9 g/dL 11.0(L) 11.0(L) 11.2(L)  Hematocrit 34.8 - 46.6 % 33.0(L) 34.2(L) 33.6(L)  Platelets 145 - 400 10e3/uL 74(L) 85(L) 97(L)   . CBC    Component Value Date/Time   WBC 60.1 (HH) 07/24/2017 1417   WBC 83.4 (H) 05/31/2017 1727   RBC 3.46 (L) 07/24/2017 1417   RBC 3.59 (L) 05/31/2017 1727   HGB 11.0 (L) 07/24/2017 1417   HCT 33.0 (L) 07/24/2017 1417   PLT 74 (L) 07/24/2017 1417   MCV 95.4 07/24/2017 1417   MCH 31.8 07/24/2017 1417   MCH 31.2 05/31/2017 1727   MCHC 33.3 07/24/2017 1417   MCHC 33.3 05/31/2017 1727   RDW 14.6 (H) 07/24/2017 1417   LYMPHSABS 54.4  (H) 07/24/2017 1417   MONOABS 0.8 07/24/2017 1417   EOSABS 0.2 07/24/2017 1417   BASOSABS 0.1 07/24/2017 1417      CMP Latest Ref Rng & Units 07/24/2017 07/04/2017 05/31/2017  Glucose 70 - 140 mg/dl 110 93 90  BUN 7.0 - 26.0 mg/dL 15.7 19 17   Creatinine 0.6 - 1.1 mg/dL 1.0 0.91 0.84  Sodium 136 - 145 mEq/L 135(L) 132(L) 133(L)  Potassium 3.5 - 5.1 mEq/L 3.8 3.7 3.5  Chloride 98 - 110 mmol/L - 98 95(L)  CO2 22 - 29 mEq/L 26 21 22   Calcium 8.4 - 10.4 mg/dL 9.3 9.0 9.5  Total Protein 6.4 - 8.3 g/dL 6.0(L) - 6.3  Total Bilirubin 0.20 - 1.20 mg/dL 0.77 - 0.6  Alkaline Phos 40 - 150 U/L 56 - 51  AST 5 - 34 U/L 26 - 25  ALT 0 - 55 U/L 15 -  14      RADIOGRAPHIC STUDIES: I have personally reviewed the radiological images as listed and agreed with the findings in the report.  CT ABDOMEN AND PELVIS WITHOUT CONTRAST   IMPRESSION: 1. No acute abnormalities within the abdomen or pelvis. 2. Mild splenomegaly and several prominent to mildly enlarged pelvic and inguinal lymph nodes. These findings are similar to the prior CT. 3. Small nonobstructing stone in the lower pole the right kidney. No ureteral stones or obstructive uropathy.   Electronically Signed   By: Lajean Manes M.D.   On: 11/14/2016 16:39    ASSESSMENT & PLAN:   71 yo caucasian female with   1.Rai Stage 2 previously - now Stage IV CLL with lymphocytosis, LNadenopathy and mild splenomegaly with anemia and thrombocytopenia.  2.  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 Only intervention thus far has been intermittent IVIG, clinically very helpful with decrease in respiratory infections.  Last imaging was CT AP 02-2015. --showed no evidence of Splenomegaly. Negative Hep B serology 2015  No overt new constitutional symptoms except mild unchanged fatigue.  2. Mild Anemia Hgb hemoglobin stable in the 10.5-11 range 3. Moderate thrombocytopenia PLT  PLT count today  lower at 74k has recently been in the 80-90k range 4. 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 Has been taking good infection prevention precautions. Has had significant recurrent headaches with IVIG - now held. 5. 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 No issues with infection-   her 13q deletion places her at risk for recurrent SCC. PLAN -we discussed her current lab findings -platelets are lower. -we discussed consideration for treatment in light of her anemia and worsening thrombocytopenia. With consideration of starting Ibrutinib. -patient after understanding the current CLL status would like to hold off on treatment consideration at this time. -continue close f/u with dermatologist for evaluation and management of non melanoma skin cancers that can be increased in patient with CLL with 13q deletion.  Labs in 4 weeks RTC with Dr Irene Limbo in 8 weeks with labs  I spent 20 minutes counseling the patient face to face. The total time spent in the appointment was 25 minutes and more than 50% was on counseling and direct patient cares.    Sullivan Lone MD [place] AAHIVMS [facility] Hematology/Oncology Physician [facility]  (Office):       [phone] (Work cell):  [phone] (Fax):           [phone]

## 2017-08-10 ENCOUNTER — Ambulatory Visit: Payer: Self-pay | Admitting: Physician Assistant

## 2017-08-14 ENCOUNTER — Other Ambulatory Visit: Payer: Self-pay | Admitting: Internal Medicine

## 2017-08-21 ENCOUNTER — Other Ambulatory Visit: Payer: 59

## 2017-08-28 NOTE — Progress Notes (Signed)
Complete Physical  Assessment and Plan:  Essential hypertension - continue medications, DASH diet, exercise and monitor at home. Call if greater than 130/80.  -     CBC with Differential/Platelet -     BASIC METABOLIC PANEL WITH GFR -     Hepatic function panel -     TSH -     Microalbumin / creatinine urine ratio -     EKG 12-Lead  Needs flu shot -     Flu vaccine HIGH DOSE PF  Need for prophylactic vaccination against Streptococcus pneumoniae (pneumococcus) -     Pneumococcal polysaccharide vaccine 23-valent greater than or equal to 2yo subcutaneous/IM  Dyspnea, unspecified type Declines cardio referral Had echo/stress test normal 2014 Check labs, fix TSH, start working out, get on GERD med If not better or worsening symptoms go to ER  OSA (obstructive sleep apnea) Sleep apnea- continue CPAP, CPAP is helping with daytime fatigue, weight loss still advised.   Thrombocytopenia (HCC) -     CBC with Differential/Platelet - monitor  Prediabetes -     Hemoglobin A1c  Morbid obesity (BMI 37.95) - long discussion about weight loss, diet, and exercise  Mixed hyperlipidemia -     Lipid panel - -continue medications, check lipids, decrease fatty foods, increase activity.   Medication management -     Magnesium  Immunocompromised (HCC) Follows Dr. 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 Irene Limbo  BMI 37.85,   adult Morbid Obesity with co morbidities - long discussion about weight loss, diet, and exercise  Environmental and seasonal allergies Continue meds  Vitamin D deficiency -     VITAMIN D 25 Hydroxy (Vit-D Deficiency, Fractures)  Viral hepatitis A without hepatic coma  Gastroesophageal reflux disease, esophagitis presence not specified Continue PPI/H2 blocker, diet discussed  Encounter for general adult medical examination with abnormal  findings  Anemia, unspecified type -     Iron,Total/Total Iron Binding Cap -     Vitamin B12  Estradiol deficiency -     DG Bone Density; Future   Discussed med's effects and SE's. Screening labs and tests as requested with regular follow-up as recommended. Over 40 minutes of exam, counseling, chart review, and complex, high level critical decision making was performed this visit.   HPI  71 y.o. 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  Her blood pressure has been controlled at home, today their BP is BP: 126/74 She does not workout. She denies chest pain,  dizziness. She has fatigue and SOB with exertion, she has had bilateral leg swelling, normal BNP, no PND, no orthopnea, no CP. Does not exert self much.  She has a history of CLL and follows with Dr. Irene Limbo, has hypogammaglobulinemia, gets IgG, has weakened immune system. .  She is not on cholesterol medication and denies myalgias. Her cholesterol is at goal. The cholesterol last visit was:   Lab Results  Component Value Date   CHOL 190 05/31/2017   HDL 57 05/31/2017   LDLCALC 108 (H) 05/31/2017  TRIG 126 05/31/2017   CHOLHDL 3.3 05/31/2017   She is on lexapro with depression.  She has been working on diet and exercise for prediabetes, and denies paresthesia of the feet, polydipsia, polyuria and visual disturbances. Last A1C in the office was:  Lab Results  Component Value Date   HGBA1C 5.6 05/31/2017    Last GFR: Lab Results  Component Value Date   GFRNONAA 64 07/04/2017   Patient is on Vitamin D supplement.   Lab Results  Component Value Date   VD25OH 74 02/27/2017    She is NOT on thyroid medication, has been consistently low x March, she has decreased fatigue, lack of energy, constipation, dry skin, swelling in her legs.  Lab Results  Component Value Date   TSH 5.15 (H) 07/04/2017  .  BMI is Body mass index is 37.09 kg/m., she is working on diet and exercise. She has history of OSA and is on CPAP.  Wt Readings from Last 3 Encounters:  08/29/17 209 lb 6.4 oz (95 kg)  07/24/17 213 lb 14.4 oz (97 kg)  07/04/17 216 lb 12.8 oz (98.3 kg)   Has back pain, going to see neurosurgeon.   Current Medications:  Current Outpatient Prescriptions on File Prior to Visit  Medication Sig Dispense Refill  . ALPRAZolam (XANAX) 1 MG tablet TAKE 1/2 TO 1 TABLET BY MOUTH THREE TIMES A DAY AS NEEDED 90 tablet 0  . bisoprolol-hydrochlorothiazide (ZIAC) 10-6.25 MG tablet TAKE ONE TABLET BY MOUTH DAILY 90 tablet 1  . escitalopram (LEXAPRO) 10 MG tablet Take 1 tablet daily for Mood 90 tablet 1  . ezetimibe (ZETIA) 10 MG tablet Take 1 tablet (10 mg total) by mouth daily. 30 tablet 1  . fexofenadine (ALLEGRA) 180 MG tablet Take 1 tablet (180 mg total) by mouth daily. Take PRN for allergies. 30 tablet 6  . FLORASTOR 250 MG capsule Take 250 mg by mouth daily.   1  . furosemide (LASIX) 40 MG tablet Take 1 tablet (40 mg total) by mouth 2 (two) times daily. (Patient taking differently: Take 40 mg by mouth daily. ) 60 tablet 11  . OVER THE COUNTER MEDICATION Nasocort daily as needed    . OVER THE COUNTER MEDICATION Sustain Ultra Eye drops    . pantoprazole (PROTONIX) 40 MG tablet Take 1 tab in the morning daily. (Patient taking differently: as needed. Take 1 tab in the morning daily.) 90 tablet 3   No current facility-administered medications on file prior to visit.    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  Health Maintenance:   Immunization History  Administered Date(s) Administered  . Influenza, High Dose Seasonal PF 08/29/2017  . Influenza,inj,Quad PF,6+ Mos 09/15/2014, 08/13/2015  . Influenza-Unspecified 08/31/2013  . Pneumococcal Conjugate-13 09/23/2015  . Pneumococcal Polysaccharide-23 08/29/2017  . Pneumococcal-Unspecified 12/28/2010  . Td 07/05/2006   Tetanus: 2007 DUE got pneumonia today Pneumovax: 2012 TODAY Prevnar 13: 2016 Flu vaccine: 2018 TODAY Zostavax:declines  Pap: s/p TAH MGM: 01/2016 DEXA: DUE Colonoscopy: 2017 q 3 years CT AB 11/2016 Sleep study 2014 Echo 2014 normal Stress test 2014 normal  Last Dental Exam: Dr. Eliezer Bottom Last Eye Exam:Dr.  Groat 08/2017 Patient Care Team: [name], MD as PCP - General  Allergies Allergies  Allergen Reactions  . Hyzaar [Losartan Potassium-Hctz] Other (See Comments)    "messed up my sodium counts" swells up lips.  [name] [Pseudoephedrine Hcl] Palpitations  . Levaquin [Levofloxacin In D5w] Diarrhea and Nausea Only  . Biaxin [Clarithromycin]     GI Upset  . Celexa [Citalopram]   . Flexeril [Cyclobenzaprine]     "zombie-like" feeling  . Fosamax [Alendronate Sodium]     GI upset  . Iohexol Hives     Code: HIVES, Desc: pt states she broke out in hive 20 yrs ago from IV contrast.     . Losartan     Angioedema  . Meloxicam     GI upset  . Norvasc [Amlodipine] Swelling  . Pseudoephedrine     Palpitations  .  Zoloft [Sertraline Hcl]     SURGICAL HISTORY She  has a past surgical history that includes Cholecystectomy (1985); Tonsillectomy and adenoidectomy (1950's); Breast surgery; Lymph node biopsy; Temporomandibular joint arthroplasty (1980's); Functional endoscopic sinus surgery (1990's); Abdominal hysterectomy (1980's); laparoscopic appendectomy (N/A, 06/04/2014); and immunoglobulin treatment (2017). FAMILY HISTORY Her family history includes Heart attack in her maternal grandmother; Hypertension in her maternal grandmother and mother; Parkinson's disease in her mother. SOCIAL HISTORY She  reports that she quit smoking about 48 years ago. Her smoking use included Cigarettes. She has a 3.00 pack-year smoking history. She has never used smokeless tobacco. She reports that she drinks about 8.4 oz of alcohol per week . She reports that she does not use drugs.  Review of Systems: Review of Systems  Constitutional: Positive for malaise/fatigue. Negative for chills, diaphoresis, fever and weight loss.  HENT: Negative for congestion, ear discharge, ear pain, hearing loss, nosebleeds, sore throat and tinnitus.        + TMJ  Eyes: Negative.  Negative for blurred vision and double vision.  Respiratory: Positive for shortness of breath. Negative for cough, hemoptysis, sputum production, wheezing and stridor.   Cardiovascular: Negative for chest pain, palpitations, orthopnea, claudication, leg swelling and PND.  Gastrointestinal: Positive for heartburn. Negative for abdominal pain, blood in stool, constipation, diarrhea, melena, nausea and vomiting.  Genitourinary: Negative for dysuria, flank pain, frequency, hematuria and urgency.  Musculoskeletal: Positive for back pain, joint pain (left shoulder) and myalgias. Negative for falls and neck pain.  Skin: Negative.   Neurological: Negative for dizziness, tingling, tremors, sensory change, speech change, focal weakness, seizures, loss of consciousness, weakness and  headaches.  Endo/Heme/Allergies: Negative for environmental allergies and polydipsia. Does not bruise/bleed easily.  Psychiatric/Behavioral: Positive for depression. Negative for hallucinations, memory loss, substance abuse and suicidal ideas. The patient is nervous/anxious. The patient does not have insomnia.     Physical Exam: Estimated body mass index is 37.09 kg/m as calculated from the following:   Height as of this encounter: 5\' 3"  (1.6 m).   Weight as of this encounter: 209 lb 6.4 oz (95 kg). BP 126/74   Pulse 78   Temp (!) 97.5 F (36.4 C)   Resp 16   Ht 5\' 3"  (1.6 m)   Wt 209 lb 6.4 oz (95 kg)   SpO2 95%   BMI 37.09 kg/m   General Appearance: Well nourished, in no apparent distress.  Eyes: PERRLA, EOMs, conjunctiva no swelling or erythema, normal fundi and vessels.  Sinuses: No Frontal/maxillary tenderness  ENT/Mouth: Ext aud canals clear, normal light reflex with TMs without erythema, bulging. Good dentition. No erythema,  swelling, or exudate on post pharynx. Tonsils not swollen or erythematous. Hearing normal.  Neck: Supple, thyroid normal. No bruits  Respiratory: Respiratory effort normal, BS equal bilaterally without rales, rhonchi, wheezing or stridor.  Cardio: RRR without murmurs, rubs or gallops. Brisk peripheral pulses without edema.  Chest: symmetric, with normal excursions and percussion.  Breasts: Symmetric, without lumps, nipple discharge, retractions.  Abdomen: Soft, nontender, no guarding, rebound, hernias, masses, or organomegaly.  Lymphatics: Non tender without lymphadenopathy.  Genitourinary:  Musculoskeletal: Full ROM all peripheral extremities,5/5 strength, and normal gait.  Skin: Warm, dry without rashes, lesions, ecchymosis. Neuro: Cranial nerves intact, reflexes equal bilaterally. Normal muscle tone, no cerebellar symptoms. Sensation intact.  Psych: Awake and oriented X 3, normal affect, Insight and Judgment appropriate.   EKG: WNL no ST  changes. AORTA SCAN: defer   Vicie Mutters 3:28 PM [facility]

## 2017-08-29 ENCOUNTER — Encounter: Payer: Self-pay | Admitting: Physician Assistant

## 2017-08-29 ENCOUNTER — Ambulatory Visit (INDEPENDENT_AMBULATORY_CARE_PROVIDER_SITE_OTHER): Payer: 59 | Admitting: Physician Assistant

## 2017-08-29 VITALS — BP 126/74 | HR 78 | Temp 97.5°F | Resp 16 | Ht 63.0 in | Wt 209.4 lb

## 2017-08-29 DIAGNOSIS — E559 Vitamin D deficiency, unspecified: Secondary | ICD-10-CM

## 2017-08-29 DIAGNOSIS — Z0001 Encounter for general adult medical examination with abnormal findings: Secondary | ICD-10-CM

## 2017-08-29 DIAGNOSIS — B159 Hepatitis A without hepatic coma: Secondary | ICD-10-CM

## 2017-08-29 DIAGNOSIS — E348 Other specified endocrine disorders: Secondary | ICD-10-CM

## 2017-08-29 DIAGNOSIS — K219 Gastro-esophageal reflux disease without esophagitis: Secondary | ICD-10-CM

## 2017-08-29 DIAGNOSIS — E782 Mixed hyperlipidemia: Secondary | ICD-10-CM

## 2017-08-29 DIAGNOSIS — C9110 Chronic lymphocytic leukemia of B-cell type not having achieved remission: Secondary | ICD-10-CM

## 2017-08-29 DIAGNOSIS — R7303 Prediabetes: Secondary | ICD-10-CM

## 2017-08-29 DIAGNOSIS — I10 Essential (primary) hypertension: Secondary | ICD-10-CM

## 2017-08-29 DIAGNOSIS — J3089 Other allergic rhinitis: Secondary | ICD-10-CM

## 2017-08-29 DIAGNOSIS — R0600 Dyspnea, unspecified: Secondary | ICD-10-CM

## 2017-08-29 DIAGNOSIS — Z79899 Other long term (current) drug therapy: Secondary | ICD-10-CM

## 2017-08-29 DIAGNOSIS — D849 Immunodeficiency, unspecified: Secondary | ICD-10-CM

## 2017-08-29 DIAGNOSIS — Z0000 Encounter for general adult medical examination without abnormal findings: Secondary | ICD-10-CM | POA: Diagnosis not present

## 2017-08-29 DIAGNOSIS — F325 Major depressive disorder, single episode, in full remission: Secondary | ICD-10-CM

## 2017-08-29 DIAGNOSIS — D801 Nonfamilial hypogammaglobulinemia: Secondary | ICD-10-CM

## 2017-08-29 DIAGNOSIS — D649 Anemia, unspecified: Secondary | ICD-10-CM

## 2017-08-29 DIAGNOSIS — Z6837 Body mass index (BMI) 37.0-37.9, adult: Secondary | ICD-10-CM

## 2017-08-29 DIAGNOSIS — Z136 Encounter for screening for cardiovascular disorders: Secondary | ICD-10-CM | POA: Diagnosis not present

## 2017-08-29 DIAGNOSIS — D899 Disorder involving the immune mechanism, unspecified: Secondary | ICD-10-CM

## 2017-08-29 DIAGNOSIS — D696 Thrombocytopenia, unspecified: Secondary | ICD-10-CM

## 2017-08-29 DIAGNOSIS — Z23 Encounter for immunization: Secondary | ICD-10-CM | POA: Diagnosis not present

## 2017-08-29 DIAGNOSIS — G4733 Obstructive sleep apnea (adult) (pediatric): Secondary | ICD-10-CM

## 2017-08-29 NOTE — Patient Instructions (Addendum)
Try the bike several times a day 5 mins up to 3 x a day Get on zantac  Go to the ER if any chest pain, shortness of breath, nausea, dizziness, severe HA, changes vision/speech  Take omeprazole over the counter for 2 weeks, then go to zantac 150-300 mg at night for 2 weeks, then you can stop.  Avoid alcohol, spicy foods, NSAIDS (aleve, ibuprofen) at this time. See foods below.   Food Choices for Gastroesophageal Reflux Disease When you have gastroesophageal reflux disease (GERD), the foods you eat and your eating habits are very important. Choosing the right foods can help ease the discomfort of GERD. WHAT GENERAL GUIDELINES DO I NEED TO FOLLOW?  Choose fruits, vegetables, whole grains, low-fat dairy products, and low-fat meat, fish, and poultry.  Limit fats such as oils, salad dressings, butter, nuts, and avocado.  Keep a food diary to identify foods that cause symptoms.  Avoid foods that cause reflux. These may be different for different people.  Eat frequent small meals instead of three large meals each day.  Eat your meals slowly, in a relaxed setting.  Limit fried foods.  Cook foods using methods other than frying.  Avoid drinking alcohol.  Avoid drinking large amounts of liquids with your meals.  Avoid bending over or lying down until 2-3 hours after eating. WHAT FOODS ARE NOT RECOMMENDED? The following are some foods and drinks that may worsen your symptoms: Vegetables Tomatoes. Tomato juice. Tomato and spaghetti sauce. Chili peppers. Onion and garlic. Horseradish. Fruits Oranges, grapefruit, and lemon (fruit and juice). Meats High-fat meats, fish, and poultry. This includes hot dogs, ribs, ham, sausage, salami, and bacon. Dairy Whole milk and chocolate milk. Sour cream. Cream. Butter. Ice cream. Cream cheese.  Beverages Coffee and tea, with or without caffeine. Carbonated beverages or energy drinks. Condiments Hot sauce. Barbecue sauce.  Sweets/Desserts Chocolate  and cocoa. Donuts. Peppermint and spearmint. Fats and Oils High-fat foods, including [place] fries and potato chips. Other Vinegar. Strong spices, such as black pepper, white pepper, red pepper, cayenne, curry powder, cloves, ginger, and chili powder.  Here is some information to help you keep your heart healthy: Move it! - Aim for 30 mins of activity every day. Take it slowly at first. Talk to [place] before starting any new exercise program.   Lose it.  -Body Mass Index (BMI) can indicate if you need to lose weight. A healthy range is 18.5-24.9. For a BMI calculator, go to [employer]  Waist Management -Excess abdominal fat is a risk factor for heart disease, diabetes, asthma, stroke and more. Ideal waist circumference is less than 35" for women and less than 40" for men.   Eat Right -focus on fruits, vegetables, whole grains, and meals you make yourself. Avoid foods with trans fat and high sugar/sodium content.   Snooze or Snore? - Loud snoring can be a sign of sleep apnea, a significant risk factor for high blood pressure, heart attach, stroke, and heart arrhythmias.  Kick the habit -Quit Smoking! Avoid second hand smoke. A single cigarette raises your blood pressure for 20 mins and increases the risk of heart attack and stroke for the next 24 hours.   Are Aspirin and Supplements right for you? -Add ENTERIC COATED low dose 81 mg Aspirin daily OR can do every other day if you have easy bruising to protect your heart and head. As well as to reduce risk of Colon Cancer by 20 %, Skin Cancer by 26 % , Melanoma by 46%  and Pancreatic cancer by 60%  Say "No to Stress -There may be little you can do about problems that cause stress. However, techniques such as long walks, meditation, and exercise can help you manage it.   Start Now! - Make changes one at a time and set reasonable goals to increase your likelihood of success.

## 2017-08-30 LAB — CBC WITH DIFFERENTIAL/PLATELET
Basophils Absolute: 84 cells/uL (ref 0–200)
Basophils Relative: 0.1 %
EOS PCT: 0.3 %
Eosinophils Absolute: 253 cells/uL (ref 15–500)
HCT: 33.3 % — ABNORMAL LOW (ref 35.0–45.0)
Hemoglobin: 11.3 g/dL — ABNORMAL LOW (ref 11.7–15.5)
LYMPHS ABS: 76369 {cells}/uL — AB (ref 850–3900)
MCH: 31.1 pg (ref 27.0–33.0)
MCHC: 33.9 g/dL (ref 32.0–36.0)
MCV: 91.7 fL (ref 80.0–100.0)
MONOS PCT: 2.5 %
MPV: 9.5 fL (ref 7.5–12.5)
NEUTROS PCT: 6.4 %
Neutro Abs: 5389 cells/uL (ref 1500–7800)
PLATELETS: 94 10*3/uL — AB (ref 140–400)
RBC: 3.63 10*6/uL — AB (ref 3.80–5.10)
RDW: 13.8 % (ref 11.0–15.0)
TOTAL LYMPHOCYTE: 90.7 %
WBC mixed population: 2105 cells/uL — ABNORMAL HIGH (ref 200–950)
WBC: 84.2 10*3/uL — AB (ref 3.8–10.8)

## 2017-08-30 LAB — HEPATIC FUNCTION PANEL
AG RATIO: 2.6 (calc) — AB (ref 1.0–2.5)
ALKALINE PHOSPHATASE (APISO): 52 U/L (ref 33–130)
ALT: 16 U/L (ref 6–29)
AST: 26 U/L (ref 10–35)
Albumin: 4.5 g/dL (ref 3.6–5.1)
BILIRUBIN DIRECT: 0.1 mg/dL (ref 0.0–0.2)
BILIRUBIN INDIRECT: 0.6 mg/dL (ref 0.2–1.2)
BILIRUBIN TOTAL: 0.7 mg/dL (ref 0.2–1.2)
Globulin: 1.7 g/dL (calc) — ABNORMAL LOW (ref 1.9–3.7)
TOTAL PROTEIN: 6.2 g/dL (ref 6.1–8.1)

## 2017-08-30 LAB — BASIC METABOLIC PANEL WITH GFR
BUN: 19 mg/dL (ref 7–25)
CO2: 22 mmol/L (ref 20–32)
CREATININE: 0.81 mg/dL (ref 0.60–0.93)
Calcium: 9.3 mg/dL (ref 8.6–10.4)
Chloride: 98 mmol/L (ref 98–110)
GFR, Est African American: 85 mL/min/{1.73_m2} (ref >=60)
GFR, Est Non African American: 74 mL/min/{1.73_m2} (ref >=60)
GLUCOSE: 92 mg/dL (ref 65–99)
POTASSIUM: 3.7 mmol/L (ref 3.5–5.3)
SODIUM: 133 mmol/L — AB (ref 135–146)

## 2017-08-30 LAB — MICROALBUMIN / CREATININE URINE RATIO
Creatinine, Urine: 16 mg/dL — ABNORMAL LOW (ref 20–275)
MICROALB/CREAT RATIO: 50 ug/mg{creat} — AB (ref <30)
Microalb, Ur: 0.8 mg/dL

## 2017-08-30 LAB — LIPID PANEL
Cholesterol: 158 mg/dL (ref <200)
HDL: 43 mg/dL — ABNORMAL LOW (ref >50)
LDL Cholesterol (Calc): 88 mg/dL (calc)
Non-HDL Cholesterol (Calc): 115 mg/dL (calc) (ref <130)
TRIGLYCERIDES: 169 mg/dL — AB (ref <150)
Total CHOL/HDL Ratio: 3.7 (calc) (ref <5.0)

## 2017-08-30 LAB — TSH: TSH: 4.06 m[IU]/L (ref 0.40–4.50)

## 2017-08-30 LAB — HEMOGLOBIN A1C
Hgb A1c MFr Bld: 5.1 % of total Hgb (ref <5.7)
MEAN PLASMA GLUCOSE: 100 (calc)
eAG (mmol/L): 5.5 (calc)

## 2017-08-30 LAB — MAGNESIUM: MAGNESIUM: 1.7 mg/dL (ref 1.5–2.5)

## 2017-08-30 LAB — IRON, TOTAL/TOTAL IRON BINDING CAP
%SAT: 14 % (calc) (ref 11–50)
Iron: 62 ug/dL (ref 45–160)
TIBC: 439 mcg/dL (calc) (ref 250–450)

## 2017-08-30 LAB — VITAMIN D 25 HYDROXY (VIT D DEFICIENCY, FRACTURES): VIT D 25 HYDROXY: 103 ng/mL — AB (ref 30–100)

## 2017-08-30 LAB — VITAMIN B12: VITAMIN B 12: 271 pg/mL (ref 200–1100)

## 2017-09-05 ENCOUNTER — Encounter: Payer: Self-pay | Admitting: Physician Assistant

## 2017-09-06 ENCOUNTER — Other Ambulatory Visit: Payer: Self-pay | Admitting: Internal Medicine

## 2017-09-07 ENCOUNTER — Encounter: Payer: Self-pay | Admitting: Physician Assistant

## 2017-09-07 ENCOUNTER — Other Ambulatory Visit: Payer: Self-pay | Admitting: Physician Assistant

## 2017-09-07 DIAGNOSIS — R0609 Other forms of dyspnea: Secondary | ICD-10-CM

## 2017-09-08 ENCOUNTER — Other Ambulatory Visit: Payer: Self-pay | Admitting: Physician Assistant

## 2017-09-08 DIAGNOSIS — E782 Mixed hyperlipidemia: Secondary | ICD-10-CM

## 2017-09-11 NOTE — Progress Notes (Signed)
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 (patient reports -planned for August 2017) 13q deletion does pre-dispose her to recurrent SCC  Current Treatment: observation  Previous treatment: IVIG for several months last winter to reduce recurrent respiratory infections (Patient notes that this helped) Has no required definitive treatment for CLL at this time and has been reluctant to consider treatment recently.  INTERVAL HISTORY:  Patient is here for her scheduled f/u for CLL. Her WBC is up from 84.2 to 94.9 as of today 09/12/2017. Platelets have also gone down from 94 to 84k as of today.  She reports that she is doing well overall. She is having back pain due to a ruptured disc from about 12/2016. She has received epidural injections for the pain and notes that PT has not helped as much as see would like. She reports that she has a consultation with Dr. Sherley Bounds on 09/22/2017 for removal of ruptured disk in her back. She states that the pain is hindering her from performing daily activities and she has been taking tramadol q6 hours since last week for this. She has an appointment with her cardiologist tomorrow.     On review of systems, pt reports back pain denies weight loss, fever, chills, night sweats, abdominal pain, changes in BM and any other acute accompanying symptoms.  REVIEW OF SYSTEMS:    10 Point review of systems of done and is negative except as noted above.  . Past Medical History:  Diagnosis Date  . CLL (chronic lymphocytic leukemia) ([place])   . GERD (gastroesophageal reflux disease)   . OSA (obstructive sleep apnea)   . Prediabetes   . Recurrent sinus infections 08/02/2012    . Past Surgical  History:  Procedure Laterality Date  . ABDOMINAL HYSTERECTOMY  1980's   "endometrosis"  . BREAST SURGERY    . CHOLECYSTECTOMY  1985  . FUNCTIONAL ENDOSCOPIC SINUS SURGERY  1990's   "cause I kept having sinus infections"  . immunoglobulin treatment  2017  . LAPAROSCOPIC APPENDECTOMY N/A 06/04/2014   Procedure: APPENDECTOMY LAPAROSCOPIC;  Surgeon: Zenovia Jarred, MD;  Location: [place];  Service: General;  Laterality: N/A;  . LYMPH NODE BIOPSY     "determined I had CLL"  . TEMPOROMANDIBULAR JOINT ARTHROPLASTY  1980's  . TONSILLECTOMY AND ADENOIDECTOMY  1950's    . Social History  Substance Use Topics  . Smoking status: Former Smoker    Packs/day: 0.75    Years: 4.00    Types: Cigarettes    Quit date: 12/05/1968  . Smokeless tobacco: Never Used  . Alcohol use 8.4 oz/week    14 Glasses of wine per week     Comment: 08/02/2012 "couple glasses wine q hs; last time 2-3 wk ago"    ALLERGIES:  is allergic to hyzaar [losartan potassium-hctz]; sudafed [pseudoephedrine hcl]; levaquin [levofloxacin in d5w]; biaxin [clarithromycin]; celexa [citalopram]; flexeril [cyclobenzaprine]; fosamax [alendronate sodium]; iohexol; losartan; meloxicam; norvasc [amlodipine]; pseudoephedrine; and zoloft [sertraline hcl].  MEDICATIONS:  Current Outpatient Prescriptions  Medication Sig Dispense Refill  . ALPRAZolam (XANAX) 1 MG tablet TAKE 1/2 TO 1 TABLET BY MOUTH THREE TIMES A DAY AS NEEDED 90 tablet 0  . bisoprolol-hydrochlorothiazide (ZIAC) 10-6.25 MG tablet TAKE ONE TABLET  BY MOUTH DAILY 90 tablet 1  . escitalopram (LEXAPRO) 10 MG tablet Take 1 tablet daily for Mood 90 tablet 1  . ezetimibe (ZETIA) 10 MG tablet TAKE ONE TABLET BY MOUTH DAILY 30 tablet 0  . fexofenadine (ALLEGRA) 180 MG tablet Take 1 tablet (180 mg total) by mouth daily. Take PRN for allergies. 30 tablet 6  . FLORASTOR 250 MG capsule Take 250 mg by mouth daily.   1  . furosemide (LASIX) 40 MG tablet Take 1 tablet (40 mg total) by mouth 2  (two) times daily. (Patient taking differently: Take 40 mg by mouth daily. ) 60 tablet 11  . OVER THE COUNTER MEDICATION Nasocort daily as needed    . OVER THE COUNTER MEDICATION Sustain Ultra Eye drops    . pantoprazole (PROTONIX) 40 MG tablet Take 1 tab in the morning daily. (Patient taking differently: as needed. Take 1 tab in the morning daily.) 90 tablet 3   No current facility-administered medications for this visit.     PHYSICAL EXAMINATION: ECOG PERFORMANCE STATUS: 1 - Symptomatic but completely ambulatory  . Vitals:   09/12/17 1533  BP: (!) 175/66  Pulse: (!) 57  Resp: 18  Temp: 98.5 F (36.9 C)  SpO2: 95%    Filed Weights   09/12/17 1533  Weight: 213 lb 11.2 oz (96.9 kg)   .Body mass index is 37.86 kg/m.  GENERAL:alert, in no acute distress and comfortable SKIN: no acute rashes , healing nose surgical wound. EYES: normal, conjunctiva are pink and non-injected, sclera clear OROPHARYNX:no exudate, no erythema and lips, buccal mucosa, and tongue normal  NECK: supple, no JVD, thyroid normal size, non-tender, without nodularity LYMPH:  no palpable lymphadenopathy in the cervical, axillary or inguinal LUNGS: clear to auscultation with normal respiratory effort HEART: regular rate & rhythm,  no murmurs and no lower extremity edema ABDOMEN: abdomen soft, non-tender, normoactive bowel sounds , no palpable hepatosplenomegaly Musculoskeletal: no cyanosis of digits and no clubbing  PSYCH: alert & oriented x 3 with fluent speech NEURO: no focal motor/sensory deficits  LABORATORY DATA:   .[name] Kitchen CBC Latest Ref Rng & Units 09/12/2017 08/29/2017 07/24/2017  WBC 3.9 - 10.3 10e3/uL 94.9(HH) 84.2(H) 60.1(HH)  Hemoglobin 11.6 - 15.9 g/dL 11.2(L) 11.3(L) 11.0(L)  Hematocrit 34.8 - 46.6 % 35.0 33.3(L) 33.0(L)  Platelets 145 - 400 10e3/uL 84(L) 94(L) 74(L)   . CBC    Component Value Date/Time   WBC 94.9 (HH) 09/12/2017 1455   WBC 84.2 (H) 08/29/2017 1553   RBC 3.64 (L) 09/12/2017  1455   RBC 3.63 (L) 08/29/2017 1553   HGB 11.2 (L) 09/12/2017 1455   HCT 35.0 09/12/2017 1455   PLT 84 (L) 09/12/2017 1455   MCV 96.2 09/12/2017 1455   MCH 30.8 09/12/2017 1455   MCH 31.1 08/29/2017 1553   MCHC 32.0 09/12/2017 1455   MCHC 33.9 08/29/2017 1553   RDW 13.7 09/12/2017 1455   LYMPHSABS 86.7 (H) 09/12/2017 1455   MONOABS 1.5 (H) 09/12/2017 1455   EOSABS 0.3 09/12/2017 1455   BASOSABS 0.2 (H) 09/12/2017 1455      CMP Latest Ref Rng & Units 08/29/2017 07/24/2017 07/04/2017  Glucose 65 - 99 mg/dL 92 110 93  BUN 7 - 25 mg/dL 19 15.7 19  Creatinine 0.60 - 0.93 mg/dL 0.81 1.0 0.91  Sodium 135 - 146 mmol/L 133(L) 135(L) 132(L)  Potassium 3.5 - 5.3 mmol/L 3.7 3.8 3.7  Chloride 98 - 110 mmol/L 98 - 98  CO2 20 - 32 mmol/L 22  26 21  Calcium 8.6 - 10.4 mg/dL 9.3 9.3 9.0  Total Protein 6.1 - 8.1 g/dL 6.2 6.0(L) -  Total Bilirubin 0.2 - 1.2 mg/dL 0.7 0.77 -  Alkaline Phos 40 - 150 U/L - 56 -  AST 10 - 35 U/L 26 26 -  ALT 6 - 29 U/L 16 15 -      RADIOGRAPHIC STUDIES: I have personally reviewed the radiological images as listed and agreed with the findings in the report.  CT ABDOMEN AND PELVIS WITHOUT CONTRAST   IMPRESSION: 1. No acute abnormalities within the abdomen or pelvis. 2. Mild splenomegaly and several prominent to mildly enlarged pelvic and inguinal lymph nodes. These findings are similar to the prior CT. 3. Small nonobstructing stone in the lower pole the right kidney. No ureteral stones or obstructive uropathy.   Electronically Signed   By: Lajean Manes M.D.   On: 11/14/2016 16:39    ASSESSMENT & PLAN:   71 yo caucasian female with   1.Rai Stage 2 previously - now Stage IV CLL with lymphocytosis, LNadenopathy and mild splenomegaly with anemia and thrombocytopenia.  2.  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 Only intervention thus far has been intermittent IVIG, clinically very helpful with  decrease in respiratory infections.  Last imaging was CT AP 02-2015. --showed no evidence of Splenomegaly. Negative Hep B serology 2015  No overt new constitutional symptoms except mild unchanged fatigue.  2. Mild Anemia Hgb hemoglobin stable in the 10.5-11 range 3. Moderate thrombocytopenia PLT  PLT count today stable at 84k has recently been in the 80-90k range 4. 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 Has been taking good infection prevention precautions. Has had significant recurrent headaches with IVIG - now held. 5. 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 No issues with infection-   her 13q deletion places her at risk for recurrent SCC.  PLAN -we discussed her current lab findings -she is been considered for disectomy by orthopedic surgery for back pain. I discussed with her that from a perspective of [name] she would be at increased risk of wound infection/delayed wound healing and potentially bleeding if her platelets were to drop peri-operative. -platelet goals would be dependent on location and type of surgery. If surgery involves tissue dissection near the spinal canal would recommend pre-operative platelet transfusion to maintain Platelet close to 100k. -will need close attention to peri-operative antibiotic prophylaxis to reduce risk of infection. -early ambulation and other appropriate peri-op VTE prophylaxis. -patient has previously and continues to desire holding off CLL directed treatment. Has no acute new strong indication to initiate CLL treatment at this time. -continue close f/u with dermatologist for evaluation and management of non melanoma skin cancers that can be increased in patient with CLL with 13q deletion.  -Discussed considerations and risks related to CLL with her surgery.  -asymptomatic at this time regarding to CLL  RTC with Dr. Irene Limbo in 2 months    I spent 20 minutes counseling the patient face to face. The total time spent in the  appointment was 25 minutes and more than 50% was on counseling and direct patient cares.    Sullivan Lone MD [place] AAHIVMS [facility] Hematology/Oncology Physician [place]  (Office):       [phone] (Work cell):  [phone] (Fax):           [phone]   This document serves as a record of services personally performed by [facility]  Irene Limbo, MD. It was created on her behalf by Alean Rinne, a trained medical scribe. The creation of this record is based on the scribe's personal observations and the provider's statements to them. This document has been checked and approved by the attending provider.

## 2017-09-12 ENCOUNTER — Other Ambulatory Visit (HOSPITAL_BASED_OUTPATIENT_CLINIC_OR_DEPARTMENT_OTHER): Payer: 59

## 2017-09-12 ENCOUNTER — Ambulatory Visit (HOSPITAL_BASED_OUTPATIENT_CLINIC_OR_DEPARTMENT_OTHER): Payer: 59 | Admitting: Hematology

## 2017-09-12 ENCOUNTER — Encounter: Payer: Self-pay | Admitting: Hematology

## 2017-09-12 VITALS — BP 175/66 | HR 57 | Temp 98.5°F | Resp 18 | Ht 63.0 in | Wt 213.7 lb

## 2017-09-12 DIAGNOSIS — C9110 Chronic lymphocytic leukemia of B-cell type not having achieved remission: Secondary | ICD-10-CM | POA: Diagnosis not present

## 2017-09-12 DIAGNOSIS — D649 Anemia, unspecified: Secondary | ICD-10-CM

## 2017-09-12 DIAGNOSIS — D696 Thrombocytopenia, unspecified: Secondary | ICD-10-CM | POA: Diagnosis not present

## 2017-09-12 DIAGNOSIS — D801 Nonfamilial hypogammaglobulinemia: Secondary | ICD-10-CM | POA: Diagnosis not present

## 2017-09-12 LAB — CBC & DIFF AND RETIC
BASO%: 0.2 % (ref 0.0–2.0)
Basophils Absolute: 0.2 10*3/uL — ABNORMAL HIGH (ref 0.0–0.1)
EOS%: 0.3 % (ref 0.0–7.0)
Eosinophils Absolute: 0.3 10*3/uL (ref 0.0–0.5)
HEMATOCRIT: 35 % (ref 34.8–46.6)
HGB: 11.2 g/dL — ABNORMAL LOW (ref 11.6–15.9)
Immature Retic Fract: 17 % — ABNORMAL HIGH (ref 1.60–10.00)
LYMPH#: 86.7 10*3/uL — AB (ref 0.9–3.3)
LYMPH%: 91.4 % — AB (ref 14.0–49.7)
MCH: 30.8 pg (ref 25.1–34.0)
MCHC: 32 g/dL (ref 31.5–36.0)
MCV: 96.2 fL (ref 79.5–101.0)
MONO#: 1.5 10*3/uL — ABNORMAL HIGH (ref 0.1–0.9)
MONO%: 1.6 % (ref 0.0–14.0)
NEUT#: 6.2 10*3/uL (ref 1.5–6.5)
NEUT%: 6.5 % — AB (ref 38.4–76.8)
PLATELETS: 84 10*3/uL — AB (ref 145–400)
RBC: 3.64 10*6/uL — ABNORMAL LOW (ref 3.70–5.45)
RDW: 13.7 % (ref 11.2–14.5)
RETIC %: 2.13 % — AB (ref 0.70–2.10)
Retic Ct Abs: 77.53 10*3/uL (ref 33.70–90.70)
WBC: 94.9 10*3/uL (ref 3.9–10.3)
nRBC: 0 % (ref 0–0)

## 2017-09-12 LAB — TECHNOLOGIST REVIEW

## 2017-09-12 NOTE — Progress Notes (Signed)
Spoke with [facility] regarding potential back surgery for patient. Pt verbalized that Dr. Ronnald Ramp would need clearance from our office before proceeding. After conversation with Network engineer at [facility], it was communicated that only PCP and Cardiology would need to clear pt for surgery. Shared this information with Dr. Irene Limbo.

## 2017-09-13 ENCOUNTER — Telehealth: Payer: Self-pay | Admitting: Hematology

## 2017-09-13 ENCOUNTER — Telehealth: Payer: Self-pay

## 2017-09-13 ENCOUNTER — Ambulatory Visit (INDEPENDENT_AMBULATORY_CARE_PROVIDER_SITE_OTHER): Payer: 59 | Admitting: Physician Assistant

## 2017-09-13 ENCOUNTER — Encounter: Payer: Self-pay | Admitting: Physician Assistant

## 2017-09-13 VITALS — BP 130/90 | HR 56 | Ht 63.0 in | Wt 209.0 lb

## 2017-09-13 DIAGNOSIS — R0609 Other forms of dyspnea: Secondary | ICD-10-CM | POA: Diagnosis not present

## 2017-09-13 DIAGNOSIS — Z01810 Encounter for preprocedural cardiovascular examination: Secondary | ICD-10-CM | POA: Diagnosis not present

## 2017-09-13 NOTE — Patient Instructions (Signed)
Medication Instructions:  TAKE EXTRA LASIX  FOR WEIGHT GAIN OF >3 POUNDS IN A DAY -OR- >5 POUNDS IN A WEEK -OR LOWER EXTREMITIES ARE SWOLLEN UPON WAKING If you need a refill on your cardiac medications before your next appointment, please call your pharmacy.  Testing/Procedures: Your physician has requested that you have a lexiscan myoview, [place].   Follow-Up: Your physician wants you to follow-up in: 6 MONTHS WITH DR KELLY-WE WILL CALL WITH ABNORMAL TEST RESULTS.   Special Instructions: LIMIT FLUIDS TO 2 LITERS PER DAY-THIS IS ALL YOUR FLUIDS, NOT JUST WATER  DAILY WEIGHTS  2,000MG  LOW SODIUM DIET    Thank you for choosing [facility] at [facility]!!      Low-Sodium Eating Plan Sodium, which is an element that makes up salt, helps you maintain a healthy balance of fluids in your body. Too much sodium can increase your blood pressure and cause fluid and waste to be held in your body. Your health care provider or dietitian may recommend following this plan if you have high blood pressure (hypertension), kidney disease, liver disease, or heart failure. Eating less sodium can help lower your blood pressure, reduce swelling, and protect your heart, liver, and kidneys. What are tips for following this plan? General guidelines  Most people on this plan should limit their sodium intake to 1,500-2,000 mg (milligrams) of sodium each day. Reading food labels  The Nutrition Facts label lists the amount of sodium in one serving of the food. If you eat more than one serving, you must multiply the listed amount of sodium by the number of servings.  Choose foods with less than 140 mg of sodium per serving.  Avoid foods with 300 mg of sodium or more per serving. Shopping  Look for lower-sodium products, often labeled as "low-sodium" or "no salt added."  Always check the sodium content even if foods are labeled as "unsalted" or "no salt added".  Buy fresh  foods. ? Avoid canned foods and premade or frozen meals. ? Avoid canned, cured, or processed meats  Buy breads that have less than 80 mg of sodium per slice. Cooking  Eat more home-cooked food and less restaurant, buffet, and fast food.  Avoid adding salt when cooking. Use salt-free seasonings or herbs instead of table salt or sea salt. Check with your health care provider or pharmacist before using salt substitutes.  Cook with plant-based oils, such as canola, sunflower, or olive oil. Meal planning  When eating at a restaurant, ask that your food be prepared with less salt or no salt, if possible.  Avoid foods that contain MSG (monosodium glutamate). MSG is sometimes added to [place] food, bouillon, and some canned foods. What foods are recommended? The items listed may not be a complete list. Talk with your dietitian about what dietary choices are best for you. Grains Low-sodium cereals, including oats, puffed wheat and rice, and shredded wheat. Low-sodium crackers. Unsalted rice. Unsalted pasta. Low-sodium bread. Whole-grain breads and whole-grain pasta. Vegetables Fresh or frozen vegetables. "No salt added" canned vegetables. "No salt added" tomato sauce and paste. Low-sodium or reduced-sodium tomato and vegetable juice. Fruits Fresh, frozen, or canned fruit. Fruit juice. Meats and other protein foods Fresh or frozen (no salt added) meat, poultry, seafood, and fish. Low-sodium canned tuna and salmon. Unsalted nuts. Dried peas, beans, and lentils without added salt. Unsalted canned beans. Eggs. Unsalted nut butters. Dairy Milk. Soy milk. Cheese that is naturally low in sodium, such as ricotta cheese, fresh mozzarella,  or Swiss cheese Low-sodium or reduced-sodium cheese. Cream cheese. Yogurt. Fats and oils Unsalted butter. Unsalted margarine with no trans fat. Vegetable oils such as canola or olive oils. Seasonings and other foods Fresh and dried herbs and spices. Salt-free  seasonings. Low-sodium mustard and ketchup. Sodium-free salad dressing. Sodium-free light mayonnaise. Fresh or refrigerated horseradish. Lemon juice. Vinegar. Homemade, reduced-sodium, or low-sodium soups. Unsalted popcorn and pretzels. Low-salt or salt-free chips. What foods are not recommended? The items listed may not be a complete list. Talk with your dietitian about what dietary choices are best for you. Grains Instant hot cereals. Bread stuffing, pancake, and biscuit mixes. Croutons. Seasoned rice or pasta mixes. Noodle soup cups. Boxed or frozen macaroni and cheese. Regular salted crackers. Self-rising flour. Vegetables Sauerkraut, pickled vegetables, and relishes. Olives. [place] fries. Onion rings. Regular canned vegetables (not low-sodium or reduced-sodium). Regular canned tomato sauce and paste (not low-sodium or reduced-sodium). Regular tomato and vegetable juice (not low-sodium or reduced-sodium). Frozen vegetables in sauces. Meats and other protein foods Meat or fish that is salted, canned, smoked, spiced, or pickled. Bacon, ham, sausage, hotdogs, corned beef, chipped beef, packaged lunch meats, salt pork, jerky, pickled herring, anchovies, regular canned tuna, sardines, salted nuts. Dairy Processed cheese and cheese spreads. Cheese curds. Blue cheese. Feta cheese. String cheese. Regular cottage cheese. Buttermilk. Canned milk. Fats and oils Salted butter. Regular margarine. Ghee. Bacon fat. Seasonings and other foods Onion salt, garlic salt, seasoned salt, table salt, and sea salt. Canned and packaged gravies. Worcestershire sauce. Tartar sauce. Barbecue sauce. Teriyaki sauce. Soy sauce, including reduced-sodium. Steak sauce. Fish sauce. Oyster sauce. Cocktail sauce. Horseradish that you find on the shelf. Regular ketchup and mustard. Meat flavorings and tenderizers. Bouillon cubes. Hot sauce and Tabasco sauce. Premade or packaged marinades. Premade or packaged taco seasonings. Relishes.  Regular salad dressings. Salsa. Potato and tortilla chips. Corn chips and puffs. Salted popcorn and pretzels. Canned or dried soups. Pizza. Frozen entrees and pot pies. Summary  Eating less sodium can help lower your blood pressure, reduce swelling, and protect your heart, liver, and kidneys.  Most people on this plan should limit their sodium intake to 1,500-2,000 mg (milligrams) of sodium each day.  Canned, boxed, and frozen foods are high in sodium. Restaurant foods, fast foods, and pizza are also very high in sodium. You also get sodium by adding salt to food.  Try to cook at home, eat more fresh fruits and vegetables, and eat less fast food, canned, processed, or prepared foods. This information is not intended to replace advice given to you by your health care provider. Make sure you discuss any questions you have with your health care provider. Document Released: 05/13/2002 Document Revised: 11/14/2016 Document Reviewed: 11/14/2016 Elsevier Interactive Patient Education  2017 [employer].

## 2017-09-13 NOTE — Telephone Encounter (Signed)
Per 10/9 - no los at checkout °

## 2017-09-13 NOTE — Progress Notes (Signed)
Cardiology Office Note   Date:  09/13/2017   ID:  Rebecca Bond, DOB January 10, 1946, MRN [id number]  PCP:  [name], MD  Cardiologist:  Dr. Claiborne Billings, 09/02/2013 [name], 12/25/2013  [name], PA-C    History of Present Illness: Rebecca Bond is a 72 y.o. female with a history of obesity, HTN, OSA, CLL, GERD, pre-DM, EF nl 2010, cath 2003 no sig CAD, OSA on CPAP  10/09 office visit w/ Oncology, pt mentioned back surgery, needs cards clearance  Rebecca Bond presents for cardiology evaluation and follow up.  She is compliant with her CPAP, cleans and maintains the equipment.   She denies orthopnea or PND, but has frequent nocturia. She sometimes wakes with lower extremity edema but is not sure how often this happens. She gets daytime lower extremity edema, but that much. She is conscious of the amount of sodium in foods and tries to watch it but wasn't going by any particular guidelines. She was told to drink plenty of water when she was put on the Lasix for lower extremity edema. She is drinking well over 64 ounces a day. She always gets increased lower extremity edema in the summer. She has cut back on the Lasix because the lower extremity edema has decreased.   Her bedroom is upstairs and she goes up the stairs several times/day. She goes slowly because of her back. She does not stop.   She does get out of breath going up stairs. She never has chest pain. She cannot walk more than 4 minutes without stopping because of her back.  She has a ruptured disc that is compressing nerves. Her oncologist notes that they will have to be aware of her low platelets and decreased immune system.     Past Medical History:  Diagnosis Date  . CLL (chronic lymphocytic leukemia) ([place])   . GERD (gastroesophageal reflux disease)   . OSA (obstructive sleep apnea)   . Prediabetes   . Recurrent sinus infections 08/02/2012    Past Surgical History:  Procedure Laterality  Date  . ABDOMINAL HYSTERECTOMY  1980's   "endometrosis"  . BREAST SURGERY    . CHOLECYSTECTOMY  1985  . FUNCTIONAL ENDOSCOPIC SINUS SURGERY  1990's   "cause I kept having sinus infections"  . immunoglobulin treatment  2017  . LAPAROSCOPIC APPENDECTOMY N/A 06/04/2014   Procedure: APPENDECTOMY LAPAROSCOPIC;  Surgeon: Zenovia Jarred, MD;  Location: [facility];  Service: General;  Laterality: N/A;  . LYMPH NODE BIOPSY     "determined I had CLL"  . TEMPOROMANDIBULAR JOINT ARTHROPLASTY  1980's  . TONSILLECTOMY AND ADENOIDECTOMY  1950's    Current Outpatient Prescriptions  Medication Sig Dispense Refill  . ALPRAZolam (XANAX) 1 MG tablet TAKE 1/2 TO 1 TABLET BY MOUTH THREE TIMES A DAY AS NEEDED 90 tablet 0  . bisoprolol-hydrochlorothiazide (ZIAC) 10-6.25 MG tablet TAKE ONE TABLET BY MOUTH DAILY 90 tablet 1  . Cyanocobalamin 1000 MCG SUBL Place 1,000 mcg under the tongue daily.    [name] Kitchen escitalopram (LEXAPRO) 10 MG tablet Take 1 tablet daily for Mood 90 tablet 1  . ezetimibe (ZETIA) 10 MG tablet TAKE ONE TABLET BY MOUTH DAILY 30 tablet 0  . fexofenadine (ALLEGRA) 180 MG tablet Take 1 tablet (180 mg total) by mouth daily. Take PRN for allergies. 30 tablet 6  . FLORASTOR 250 MG capsule Take 250 mg by mouth daily.   1  . furosemide (LASIX) 40 MG tablet Take 20 mg by mouth  as needed.    [name] Kitchen OVER THE COUNTER MEDICATION Nasocort daily as needed    . OVER THE COUNTER MEDICATION Sustain Ultra Eye drops    . pantoprazole (PROTONIX) 40 MG tablet Take 1 tab in the morning daily. (Patient taking differently: as needed. Take 1 tab in the morning daily.) 90 tablet 3  . traMADol (ULTRAM) 50 MG tablet Take by mouth every 6 (six) hours as needed for severe pain.     No current facility-administered medications for this visit.     Allergies:   Hyzaar [losartan potassium-hctz]; Sudafed [pseudoephedrine hcl]; Levaquin [levofloxacin in d5w]; Biaxin [clarithromycin]; Celexa [citalopram]; Flexeril [cyclobenzaprine]; Fosamax  [alendronate sodium]; Iohexol; Losartan; Meloxicam; Norvasc [amlodipine]; Pseudoephedrine; and Zoloft [sertraline hcl]    Social History:  The patient  reports that she quit smoking about 48 years ago. Her smoking use included Cigarettes. She has a 3.00 pack-year smoking history. She has never used smokeless tobacco. She reports that she drinks about 8.4 oz of alcohol per week . She reports that she does not use drugs.   Family History:  The patient's family history includes Heart attack in her maternal grandmother; Hypertension in her maternal grandmother and mother; Parkinson's disease in her mother.    ROS:  Please see the history of present illness. All other systems are reviewed and negative.    PHYSICAL EXAM: VS:  BP 130/90 (BP Location: Left Arm, Patient Position: Sitting, Cuff Size: Large)   Pulse (!) 56   Ht 5\' 3"  (1.6 m)   Wt 209 lb (94.8 kg)   BMI 37.02 kg/m  , BMI Body mass index is 37.02 kg/m. GEN: Well nourished, well developed, female in no acute distress  HEENT: normal for age  Neck: no JVD, no carotid bruit, no masses Cardiac: RRR; soft murmur, no rubs, or gallops Respiratory:  clear to auscultation bilaterally, normal work of breathing GI: soft, nontender, nondistended, + BS MS: no deformity or atrophy; trace pedal edema; distal pulses are 2+ in all 4 extremities   Skin: warm and dry, no rash Neuro:  Strength and sensation are intact Psych: euthymic mood, full affect   EKG:  EKG is ordered today. The ekg ordered today demonstrates sinus bradycardia, heart rate 56, nonspecific ST changes similar to previous ECGs   Recent Labs: 05/31/2017: Brain Natriuretic Peptide 109.8 08/29/2017: ALT 16; BUN 19; Creat 0.81; Magnesium 1.7; Potassium 3.7; Sodium 133; TSH 4.06 09/12/2017: HGB 11.2; Platelets 84    Lipid Panel    Component Value Date/Time   CHOL 158 08/29/2017 1553   TRIG 169 (H) 08/29/2017 1553   HDL 43 (L) 08/29/2017 1553   CHOLHDL 3.7 08/29/2017 1553    VLDL 25 05/31/2017 1727   LDLCALC 108 (H) 05/31/2017 1727     Wt Readings from Last 3 Encounters:  09/13/17 209 lb (94.8 kg)  09/12/17 213 lb 11.2 oz (96.9 kg)  08/29/17 209 lb 6.4 oz (95 kg)     Other studies Reviewed: Additional studies/ records that were reviewed today include: Office notes, hospital records and testing.  ASSESSMENT AND PLAN:  1.  Preoperative evaluation: Her baseline activity level is poor. She is not able to exercise. She has no recent testing or ischemic evaluation. She has multiple cardiac risk factors. Lexi scan Myoview is ordered. The patient was anxious about this but was advised that we use a radioactive tracer, not IV dye (allergic). She had several other questions, all answered to her satisfaction.  2. Hypertension: Her diastolic blood pressures a little  above target today. However, she has some anxiety about her medical conditions and does not believe her blood pressure runs this high at home.  3. Dyspnea on exertion: On her echocardiogram from 2014, there was mild concentric LVH, normal LV function and diastolic parameters were normal. A PFO could not be excluded. She is encouraged to follow a 2000 mg sodium diet and limit fluids to 64 ounces daily. She is to continue the Lasix as directed and HCTZ daily. She is encouraged to do daily weights.   Current medicines are reviewed at length with the patient today.  The patient does not have concerns regarding medicines.  The following changes have been made:  Take extra Lasix as needed for weight gain or swelling  Labs/ tests ordered today include:   Orders Placed This Encounter  Procedures  . MYOCARDIAL PERFUSION IMAGING  . EKG 12-Lead     Disposition:   FU with Dr. Claiborne Billings  Signed, [name], PA-C  09/13/2017 4:23 PM    [facility] Phone: [phone]; Fax: [phone]  This note was written with the assistance of speech recognition software. Please excuse any  transcriptional errors.

## 2017-09-13 NOTE — Telephone Encounter (Signed)
Left VM with pt. Request this morning to have a copy of letter from Dr. Irene Limbo that was to be sent to [facility]. She said, "I understood everything Dr. Irene Limbo said yesterday, but I just want a copy to have." Per RN at [facility] there is not a formal clearance required from a hematology standpoint. Dr. Irene Limbo is okay for the pt to be given a copy of the OV note from yesterday.   Spoke with Tiffany, HIM, and asked her about our protocol for release of information. "Request For Access" form must be filled out by the patient and photo ID provided. Told pt in VM that she will have to come. This is a form that her husband cannot fill out for her. When the note is finished, pt to expect a call and she will be able to come to the [facility] at her convenience to fill it out and receive a copy of the last OV note.

## 2017-09-14 ENCOUNTER — Telehealth (HOSPITAL_COMMUNITY): Payer: Self-pay

## 2017-09-14 NOTE — Telephone Encounter (Signed)
Encounter complete. 

## 2017-09-15 ENCOUNTER — Telehealth: Payer: Self-pay | Admitting: *Deleted

## 2017-09-15 NOTE — Telephone Encounter (Signed)
Phone number for [facility] should be [phone]  Called office, informed [facility] surgical scheduler for Dr. Ronnald Ramp, of status (pending myoview 10/16). She voiced thanks for update, aware we will notify of outcome of test and any recommendations by cardiology provider.

## 2017-09-15 NOTE — Telephone Encounter (Signed)
   [facility] Pre-operative Risk Assessment    Request for surgical clearance:  1. What type of surgery is being performed? L4-5 Extraforaminal Microdiskectomy   2. When is this surgery scheduled? N/Rebecca   3. Are there any medications that need to be held prior to surgery and how long? N/Rebecca  4. Practice name and name of physician performing surgery? Dr. Ronnald Ramp at [facility]   5. What is your office phone and fax number? [phone]   6. Anesthesia type (None, local, MAC, general) ? General   Rebecca Bond Rebecca Bond 09/15/2017, 9:22 AM  _________________________________________________________________   (provider comments below)

## 2017-09-15 NOTE — Telephone Encounter (Signed)
Patient was just seen by Lauretta Chester PA-C 2 days ago, lexiscan myoview ordered for 09/19/2017 prior to cardiac clearance, pending myoview.

## 2017-09-19 ENCOUNTER — Ambulatory Visit (HOSPITAL_COMMUNITY)
Admission: RE | Admit: 2017-09-19 | Discharge: 2017-09-19 | Disposition: A | Discharge status: Home / Self Care | Payer: 59 | Source: Ambulatory Visit | Attending: Cardiology | Admitting: Cardiology

## 2017-09-19 ENCOUNTER — Other Ambulatory Visit: Payer: Self-pay | Admitting: Internal Medicine

## 2017-09-19 DIAGNOSIS — Z01810 Encounter for preprocedural cardiovascular examination: Secondary | ICD-10-CM | POA: Diagnosis not present

## 2017-09-19 DIAGNOSIS — R0609 Other forms of dyspnea: Secondary | ICD-10-CM | POA: Diagnosis not present

## 2017-09-19 DIAGNOSIS — I10 Essential (primary) hypertension: Secondary | ICD-10-CM | POA: Insufficient documentation

## 2017-09-19 DIAGNOSIS — Z87891 Personal history of nicotine dependence: Secondary | ICD-10-CM | POA: Diagnosis not present

## 2017-09-19 DIAGNOSIS — G4733 Obstructive sleep apnea (adult) (pediatric): Secondary | ICD-10-CM | POA: Insufficient documentation

## 2017-09-19 DIAGNOSIS — R9431 Abnormal electrocardiogram [ECG] [EKG]: Secondary | ICD-10-CM | POA: Diagnosis not present

## 2017-09-19 DIAGNOSIS — E785 Hyperlipidemia, unspecified: Secondary | ICD-10-CM | POA: Insufficient documentation

## 2017-09-19 DIAGNOSIS — E669 Obesity, unspecified: Secondary | ICD-10-CM | POA: Diagnosis not present

## 2017-09-19 DIAGNOSIS — Z8249 Family history of ischemic heart disease and other diseases of the circulatory system: Secondary | ICD-10-CM | POA: Insufficient documentation

## 2017-09-19 DIAGNOSIS — F411 Generalized anxiety disorder: Secondary | ICD-10-CM

## 2017-09-19 DIAGNOSIS — R0602 Shortness of breath: Secondary | ICD-10-CM | POA: Diagnosis present

## 2017-09-19 DIAGNOSIS — Z01818 Encounter for other preprocedural examination: Secondary | ICD-10-CM | POA: Insufficient documentation

## 2017-09-19 LAB — MYOCARDIAL PERFUSION IMAGING
CHL CUP NUCLEAR SDS: 7
CHL CUP NUCLEAR SSS: 7
CHL CUP RESTING HR STRESS: 51 {beats}/min
LVDIAVOL: 79 mL (ref 46–106)
LVSYSVOL: 26 mL
NUC STRESS TID: 1.39
Peak HR: 79 {beats}/min
SRS: 0

## 2017-09-19 MED ORDER — TECHNETIUM TC 99M TETROFOSMIN IV KIT
31.6000 | PACK | Freq: Once | INTRAVENOUS | Status: AC | PRN
  Administered 2017-09-19: 31.6 via INTRAVENOUS
  Filled 2017-09-19: qty 32

## 2017-09-19 MED ORDER — REGADENOSON 0.4 MG/5ML IV SOLN
0.4000 mg | Freq: Once | INTRAVENOUS | Status: AC
  Administered 2017-09-19: 0.4 mg via INTRAVENOUS

## 2017-09-19 MED ORDER — TECHNETIUM TC 99M TETROFOSMIN IV KIT
10.0000 | PACK | Freq: Once | INTRAVENOUS | Status: AC | PRN
  Administered 2017-09-19: 10 via INTRAVENOUS
  Filled 2017-09-19: qty 10

## 2017-09-21 NOTE — Telephone Encounter (Signed)
MV was low risk, pt is at acceptable risk for the planned surgery without further cardiology workup

## 2017-09-21 NOTE — Telephone Encounter (Signed)
Clearance fwd/faxed via EPIC to Dr.Jones @ [facility]

## 2017-09-22 ENCOUNTER — Telehealth: Payer: Self-pay | Admitting: Physician Assistant

## 2017-09-22 ENCOUNTER — Telehealth (HOSPITAL_COMMUNITY): Payer: Self-pay

## 2017-09-22 NOTE — Telephone Encounter (Signed)
OK 

## 2017-09-22 NOTE — Telephone Encounter (Signed)
S/w patient. Aware of cardiac clearance and of low risk stress test w/ normal EF/ECG. Pt wanted to know if any further recommendations for her DOE.

## 2017-09-22 NOTE — Telephone Encounter (Signed)
I did return call. Pt wanted to know who would be calling her with the results of her NUC MPI . I did inform patient that the physician or nurse to physician who ordered the test would be reaching out to her with the results once they are ready.

## 2017-09-22 NOTE — Telephone Encounter (Signed)
[name] is returning your Call Ovid Curd)  Thanks

## 2017-09-22 NOTE — Telephone Encounter (Signed)
F/u Message ° °Pt returning RN call .please call back to discuss  °

## 2017-09-25 NOTE — Telephone Encounter (Signed)
Please let her know that after her surgery, she will be more able to increase her activity.  If she wishes to pursue further testing, can schedule a CPX, but think the back surgery and recovery should come first.

## 2017-09-26 ENCOUNTER — Telehealth: Payer: Self-pay

## 2017-09-26 ENCOUNTER — Telehealth: Payer: Self-pay | Admitting: Hematology

## 2017-09-26 NOTE — Telephone Encounter (Signed)
Line busy when dialed. 

## 2017-09-26 NOTE — Telephone Encounter (Signed)
Received message re patient calling and inquiring about appointments. Per patient she has been waiting 2 weeks. Patient last seen 10/9 - no los completed.   Checked with desk nurse and per desk nurse patient to f/u in 2 mos. Spoke with patient re f/u with [facility] 12/5 @ 3 pm.

## 2017-09-26 NOTE — Telephone Encounter (Signed)
Pt concern regarding f/u with Dr. Irene Limbo. Confirmed with Dr. Irene Limbo that pt is to f/u with him in two months. Communicated back to Audie Clear, Scheduling/Registration Lead who will get in touch with the pt.

## 2017-09-28 ENCOUNTER — Other Ambulatory Visit: Payer: Self-pay

## 2017-09-28 ENCOUNTER — Telehealth: Payer: Self-pay

## 2017-09-28 DIAGNOSIS — C9110 Chronic lymphocytic leukemia of B-cell type not having achieved remission: Secondary | ICD-10-CM

## 2017-09-28 NOTE — Telephone Encounter (Signed)
Pt called to find understanding as to why her appt made for December did not have any labs. In-basket sent to Dr. Irene Limbo to confirm or deny need for lab work in December. Pt to expect phone call back.

## 2017-09-28 NOTE — Telephone Encounter (Signed)
S/w patient, recommendations discussed. No further concerns or questions at this time, pt will f/u after surgery to discuss options as needed. She expressed thanks for f/u call.

## 2017-09-28 NOTE — Telephone Encounter (Signed)
Pt called this morning wondering if she needed a lab appt prior to her doctor visit on 11/08/17. Dr. Irene Limbo agreed that she needs labs. Appt added for 2:30pm on 12/5, and labs ordered for CBC, CMET, and LDH per Dr. Irene Limbo.

## 2017-10-03 ENCOUNTER — Other Ambulatory Visit: Payer: Self-pay | Admitting: Physician Assistant

## 2017-10-11 ENCOUNTER — Encounter: Payer: Self-pay | Admitting: Internal Medicine

## 2017-10-13 ENCOUNTER — Other Ambulatory Visit: Payer: Self-pay | Admitting: Neurological Surgery

## 2017-10-19 ENCOUNTER — Telehealth: Payer: Self-pay | Admitting: *Deleted

## 2017-10-19 NOTE — Telephone Encounter (Signed)
Pt called asking if Dr. Irene Limbo would be ordering labs/transfusion pre and post operatively during 11/28 back surgery.  Reviewed with Dr. Irene Limbo.  Per Dr. Irene Limbo, all recommendations have been sent to surgeon.  Dr. Irene Limbo can not enter perioperative orders.  Dr. Irene Limbo stated pt may not need platelet transfusion based on how invasive surgery would be.  Informed pt that all recommendations have been sent and any perioperative orders would need to come from Dr. Ronnald Ramp.  Pt agreed/verbalized understanding.

## 2017-10-23 ENCOUNTER — Other Ambulatory Visit: Payer: Self-pay | Admitting: Physician Assistant

## 2017-10-23 ENCOUNTER — Other Ambulatory Visit: Payer: Self-pay | Admitting: Internal Medicine

## 2017-10-23 DIAGNOSIS — E782 Mixed hyperlipidemia: Secondary | ICD-10-CM

## 2017-10-23 DIAGNOSIS — F411 Generalized anxiety disorder: Secondary | ICD-10-CM

## 2017-10-27 NOTE — Pre-Procedure Instructions (Addendum)
Yardley Beltran Hayworth  10/27/2017      [facility] [address], [place] [address] Phone: [phone] Fax: [phone]  [id number] - [place], [place] [address] [place] [address] Phone: [phone] Fax: [phone]  [facility] - [place], [place] [address] [place] [address] Phone: [phone] Fax: [place] [address] [place] [address] Phone: [phone] Fax: [phone]    Your procedure is scheduled on 11/01/2017.  Report to [facility] Admitting at 12:45 P.M.  Call this number if you have problems the morning of surgery:  [phone]   Remember:  Do not eat food or drink liquids after midnight.  Take these medicines the morning of surgery with A SIP OF WATER: Alprazolam (Xanax) - if needed Dicyclomine (Bentyl) - if needed Escitalopram (Lexapro) Fexofenadine (Allegra) - if needed Polyethyl Glucol-Propyl Glycol (Systane OP) eye drop Triamcinolone (Nasacort) - of needed  7 days prior to surgery STOP taking any Aspirin(unless otherwise instructed by your surgeon), Aleve, Naproxen, Ibuprofen, Motrin, Advil, Goody's, BC's, all herbal medications, fish oil, and all vitamins    Do not wear jewelry, make-up or nail polish.  Do not wear lotions, powders, or perfumes, or deodorant.  Do not shave 48 hours prior to surgery.    Do not bring valuables to the hospital.  [facility] is not responsible for any belongings or valuables.  Contacts, eyeglasses, dentures or bridgework may not be worn into surgery.  Leave your suitcase in the car.  After surgery it may be brought to your room.  For patients admitted to the hospital, discharge time will be determined by  your treatment team.  Patients discharged the day of surgery will not be allowed to drive home.   Name and phone number of your driver:    Special instructions:   [facility]- Preparing For Surgery  Before surgery, you can play an important role. Because skin is not sterile, your skin needs to be as free of germs as possible. You can reduce the number of germs on your skin by washing with CHG (chlorahexidine gluconate) Soap before surgery.  CHG is an antiseptic cleaner which kills germs and bonds with the skin to continue killing germs even after washing.  Please do not use if you have an allergy to CHG or antibacterial soaps. If your skin becomes reddened/irritated stop using the CHG.  Do not shave (including legs and underarms) for at least 48 hours prior to first CHG shower. It is OK to shave your face.  Please follow these instructions carefully.   1. Shower the NIGHT BEFORE SURGERY and the MORNING OF SURGERY with CHG.   2. If you chose to wash your hair, wash your hair first as usual with your normal shampoo.  3. After you shampoo, rinse your hair and body thoroughly to remove the shampoo.  4. Use CHG as you would any other liquid soap. You can apply CHG directly to the skin and wash gently with a scrungie or a clean washcloth.   5. Apply the CHG Soap to your body ONLY FROM THE NECK DOWN.  Do not use on open wounds or open sores. Avoid contact with your eyes, ears, mouth and genitals (private parts). Wash Face  and genitals (private parts)  with your normal soap.  6. Wash thoroughly, paying special attention to the area where your surgery will be performed.  7. Thoroughly rinse your body with warm water from the neck down.  8. DO NOT shower/wash with your normal soap after using and rinsing off the CHG Soap.  9. Pat yourself dry with a CLEAN TOWEL.  10. Wear CLEAN PAJAMAS to bed the night before surgery, wear comfortable clothes the morning of surgery  11. Place CLEAN SHEETS on  your bed the night of your first shower and DO NOT SLEEP WITH PETS.    Day of Surgery: Shower as stated above. Do not apply any deodorants/lotions. Please wear clean clothes to the hospital/surgery center.      Please read over the following fact sheets that you were given.

## 2017-10-30 ENCOUNTER — Encounter (HOSPITAL_COMMUNITY)
Admission: RE | Admit: 2017-10-30 | Discharge: 2017-10-30 | Disposition: A | Discharge status: Home / Self Care | Payer: 59 | Source: Ambulatory Visit | Attending: Neurological Surgery | Admitting: Neurological Surgery

## 2017-10-30 ENCOUNTER — Encounter (HOSPITAL_COMMUNITY): Admission: RE | Admit: 2017-10-30 | Payer: 59 | Source: Ambulatory Visit

## 2017-10-30 ENCOUNTER — Encounter (HOSPITAL_COMMUNITY): Payer: Self-pay

## 2017-10-30 ENCOUNTER — Other Ambulatory Visit: Payer: Self-pay

## 2017-10-30 ENCOUNTER — Other Ambulatory Visit: Payer: Self-pay | Admitting: Neurological Surgery

## 2017-10-30 DIAGNOSIS — F419 Anxiety disorder, unspecified: Secondary | ICD-10-CM | POA: Diagnosis not present

## 2017-10-30 DIAGNOSIS — Z01812 Encounter for preprocedural laboratory examination: Secondary | ICD-10-CM | POA: Insufficient documentation

## 2017-10-30 DIAGNOSIS — Z87442 Personal history of urinary calculi: Secondary | ICD-10-CM | POA: Diagnosis not present

## 2017-10-30 DIAGNOSIS — Z79899 Other long term (current) drug therapy: Secondary | ICD-10-CM | POA: Insufficient documentation

## 2017-10-30 DIAGNOSIS — Z7951 Long term (current) use of inhaled steroids: Secondary | ICD-10-CM | POA: Diagnosis not present

## 2017-10-30 DIAGNOSIS — M5136 Other intervertebral disc degeneration, lumbar region: Secondary | ICD-10-CM | POA: Diagnosis not present

## 2017-10-30 DIAGNOSIS — C9110 Chronic lymphocytic leukemia of B-cell type not having achieved remission: Secondary | ICD-10-CM | POA: Diagnosis not present

## 2017-10-30 DIAGNOSIS — Z888 Allergy status to other drugs, medicaments and biological substances status: Secondary | ICD-10-CM | POA: Diagnosis not present

## 2017-10-30 DIAGNOSIS — I10 Essential (primary) hypertension: Secondary | ICD-10-CM

## 2017-10-30 DIAGNOSIS — Z0183 Encounter for blood typing: Secondary | ICD-10-CM | POA: Insufficient documentation

## 2017-10-30 DIAGNOSIS — K219 Gastro-esophageal reflux disease without esophagitis: Secondary | ICD-10-CM | POA: Insufficient documentation

## 2017-10-30 DIAGNOSIS — Z821 Family history of blindness and visual loss: Secondary | ICD-10-CM | POA: Diagnosis not present

## 2017-10-30 DIAGNOSIS — Z9842 Cataract extraction status, left eye: Secondary | ICD-10-CM | POA: Diagnosis not present

## 2017-10-30 DIAGNOSIS — Z856 Personal history of leukemia: Secondary | ICD-10-CM

## 2017-10-30 DIAGNOSIS — Z886 Allergy status to analgesic agent status: Secondary | ICD-10-CM | POA: Diagnosis not present

## 2017-10-30 DIAGNOSIS — F329 Major depressive disorder, single episode, unspecified: Secondary | ICD-10-CM | POA: Diagnosis not present

## 2017-10-30 DIAGNOSIS — Z87891 Personal history of nicotine dependence: Secondary | ICD-10-CM

## 2017-10-30 DIAGNOSIS — G4733 Obstructive sleep apnea (adult) (pediatric): Secondary | ICD-10-CM

## 2017-10-30 DIAGNOSIS — Z9889 Other specified postprocedural states: Secondary | ICD-10-CM | POA: Diagnosis not present

## 2017-10-30 DIAGNOSIS — R7303 Prediabetes: Secondary | ICD-10-CM | POA: Insufficient documentation

## 2017-10-30 DIAGNOSIS — Z01818 Encounter for other preprocedural examination: Secondary | ICD-10-CM | POA: Insufficient documentation

## 2017-10-30 DIAGNOSIS — Z8249 Family history of ischemic heart disease and other diseases of the circulatory system: Secondary | ICD-10-CM | POA: Diagnosis not present

## 2017-10-30 DIAGNOSIS — Z9049 Acquired absence of other specified parts of digestive tract: Secondary | ICD-10-CM | POA: Diagnosis not present

## 2017-10-30 DIAGNOSIS — R918 Other nonspecific abnormal finding of lung field: Secondary | ICD-10-CM

## 2017-10-30 DIAGNOSIS — Z90710 Acquired absence of both cervix and uterus: Secondary | ICD-10-CM | POA: Diagnosis not present

## 2017-10-30 DIAGNOSIS — M5126 Other intervertebral disc displacement, lumbar region: Secondary | ICD-10-CM

## 2017-10-30 DIAGNOSIS — Z820 Family history of epilepsy and other diseases of the nervous system: Secondary | ICD-10-CM | POA: Diagnosis not present

## 2017-10-30 DIAGNOSIS — Z9841 Cataract extraction status, right eye: Secondary | ICD-10-CM | POA: Diagnosis not present

## 2017-10-30 DIAGNOSIS — I119 Hypertensive heart disease without heart failure: Secondary | ICD-10-CM | POA: Diagnosis not present

## 2017-10-30 HISTORY — DX: Unspecified osteoarthritis, unspecified site: M19.90

## 2017-10-30 HISTORY — DX: Essential (primary) hypertension: I10

## 2017-10-30 HISTORY — DX: Personal history of urinary calculi: Z87.442

## 2017-10-30 HISTORY — DX: Headache, unspecified: R51.9

## 2017-10-30 HISTORY — DX: Headache: R51

## 2017-10-30 HISTORY — DX: Anemia, unspecified: D64.9

## 2017-10-30 LAB — CBC WITH DIFFERENTIAL/PLATELET
BASOS PCT: 0 %
Basophils Absolute: 0 10*3/uL (ref 0.0–0.1)
EOS PCT: 0 %
Eosinophils Absolute: 0 10*3/uL (ref 0.0–0.7)
HCT: 34.2 % — ABNORMAL LOW (ref 36.0–46.0)
HEMOGLOBIN: 10.9 g/dL — AB (ref 12.0–15.0)
LYMPHS PCT: 91 %
Lymphs Abs: 68.3 10*3/uL — ABNORMAL HIGH (ref 0.7–4.0)
MCH: 31 pg (ref 26.0–34.0)
MCHC: 31.9 g/dL (ref 30.0–36.0)
MCV: 97.2 fL (ref 78.0–100.0)
Monocytes Absolute: 1.5 10*3/uL — ABNORMAL HIGH (ref 0.1–1.0)
Monocytes Relative: 2 %
NEUTROS PCT: 7 %
Neutro Abs: 5.3 10*3/uL (ref 1.7–7.7)
Platelets: 82 10*3/uL — ABNORMAL LOW (ref 150–400)
RBC: 3.52 MIL/uL — ABNORMAL LOW (ref 3.87–5.11)
RDW: 14.3 % (ref 11.5–15.5)
WBC: 75.1 10*3/uL (ref 4.0–10.5)

## 2017-10-30 LAB — BASIC METABOLIC PANEL
ANION GAP: 7 (ref 5–15)
BUN: 17 mg/dL (ref 6–20)
CHLORIDE: 99 mmol/L — AB (ref 101–111)
CO2: 27 mmol/L (ref 22–32)
Calcium: 9.6 mg/dL (ref 8.9–10.3)
Creatinine, Ser: 0.81 mg/dL (ref 0.44–1.00)
GFR calc non Af Amer: >60 mL/min (ref >60)
Glucose, Bld: 96 mg/dL (ref 65–99)
Potassium: 4.8 mmol/L (ref 3.5–5.1)
Sodium: 133 mmol/L — ABNORMAL LOW (ref 135–145)

## 2017-10-30 LAB — TYPE AND SCREEN
ABO/RH(D): AB POS
ANTIBODY SCREEN: NEGATIVE

## 2017-10-30 LAB — PROTIME-INR
INR: 1
PROTHROMBIN TIME: 13.1 s (ref 11.4–15.2)

## 2017-10-30 LAB — SURGICAL PCR SCREEN
MRSA, PCR: NEGATIVE
STAPHYLOCOCCUS AUREUS: NEGATIVE

## 2017-10-30 LAB — ABO/RH: ABO/RH(D): AB POS

## 2017-10-30 NOTE — Progress Notes (Signed)
Spoke with Rebecca Bond at Dr. Adah Salvage office.  She is going to put in order for provider attestation for blood consent so that we can do Informed consent for blood and/or blood products.

## 2017-10-31 NOTE — Progress Notes (Signed)
Anesthesia Chart Review:  Pt is a 71 year old female scheduled for left L4-5 extraforaminal microdiscectomy on 11/01/2017 with Sherley Bounds, MD  - PCP is [name], MD - Saw [name], PA with cardiology 09/13/17.  Stress test ordered, results below. Pt cleared for surgery in comment on stress test.  - Oncologist is Sullivan Lone, MD. Last office visit 09/12/17.  Dr. Irene Limbo recommends pt's platelet count be ~100,000 for surgery.   PMH includes:  CLL, HTN, pre-diabetes, OSA, anemia, GERD. Former smoker. BMI 38. S/p appendectomy 06/04/14.   Medications include: bisoprolol-hctz, zetia, lasix, protonix  BP 140/84 Comment: rechecked/taken manaually in right arm  Pulse 65   Temp 36.7 C   Resp 20   Ht 5\' 3"  (1.6 m)   Wt 215 lb 6.4 oz (97.7 kg)   SpO2 98%   BMI 38.16 kg/m   Preoperative labs reviewed.   - WBC 75.1.  Platelets 82.  These results are consistent with prior results.   CXR 10/30/17:  - Right infrahilar airspace opacity concerning for pneumonia. Follow-up PA and lateral chest X-ray is recommended in 3-4 weeks following trial of antibiotic therapy to ensure resolution and exclude underlying malignancy.  EKG 10/30/17: NSR. Nonspecific ST abnormality  Nuclear stress test 09/19/17:   Nuclear stress EF: 66%.  The left ventricular ejection fraction is hyperdynamic (>65%).  There was no ST segment deviation noted during stress.  This is a low risk study. 1. EF 66%, normal wall motion.  2. Reversible small mild mid anterior perfusion defect.  Suspect this may be due to shifting breast artifact, less likely small area of ischemia.  - Low risk study.   Echo 03/25/13:  - Left ventricle: There was mild concentric hypertrophy.Systolic function was normal. The estimated ejectionfraction was in the range of 55% to 60%. Wall motion wasnormal; there were no regional wall motion abnormalities.Left ventricular diastolic function parameters were normal. - Atrial septum: A patent  foramen ovale cannot be excluded.  Cardiac cath 08/30/02:  1. No significant CAD (10% proximal in ramus intermedius) 2. Normal LV systolic function 3. No evidence of renal artery stenosis 4. Systemic HTN.   I notified Lorriane Shire in Dr. Ronnald Ramp' office of CXR results showing pneumonia.    Pt to arrive to holding 3 hours prior to surgery in order to receive platelets.  CBC will be rechecked after platelet administration.   Pt will need further assessment by assigned anesthesiologist day of surgery due to possible pneumonia.   Willeen Cass, FNP-BC [facility] Short Stay Surgical Center/Anesthesiology Phone: [phone] 10/31/2017 10:33 AM

## 2017-11-01 ENCOUNTER — Encounter (HOSPITAL_COMMUNITY)
Admission: RE | Disposition: A | Discharge status: Home / Self Care | Payer: Self-pay | Source: Ambulatory Visit | Attending: Neurological Surgery

## 2017-11-01 ENCOUNTER — Ambulatory Visit (HOSPITAL_COMMUNITY): Payer: 59 | Admitting: Emergency Medicine

## 2017-11-01 ENCOUNTER — Ambulatory Visit (HOSPITAL_COMMUNITY): Payer: 59

## 2017-11-01 ENCOUNTER — Encounter (HOSPITAL_COMMUNITY): Payer: Self-pay | Admitting: *Deleted

## 2017-11-01 ENCOUNTER — Observation Stay (HOSPITAL_COMMUNITY)
Admission: RE | Admit: 2017-11-01 | Discharge: 2017-11-02 | Disposition: A | Discharge status: Home / Self Care | Payer: 59 | Source: Ambulatory Visit | Attending: Neurological Surgery | Admitting: Neurological Surgery

## 2017-11-01 ENCOUNTER — Other Ambulatory Visit: Payer: Self-pay

## 2017-11-01 DIAGNOSIS — Z87891 Personal history of nicotine dependence: Secondary | ICD-10-CM | POA: Insufficient documentation

## 2017-11-01 DIAGNOSIS — M5126 Other intervertebral disc displacement, lumbar region: Secondary | ICD-10-CM | POA: Diagnosis not present

## 2017-11-01 DIAGNOSIS — Z87442 Personal history of urinary calculi: Secondary | ICD-10-CM | POA: Insufficient documentation

## 2017-11-01 DIAGNOSIS — Z9049 Acquired absence of other specified parts of digestive tract: Secondary | ICD-10-CM | POA: Insufficient documentation

## 2017-11-01 DIAGNOSIS — F419 Anxiety disorder, unspecified: Secondary | ICD-10-CM | POA: Insufficient documentation

## 2017-11-01 DIAGNOSIS — Z9842 Cataract extraction status, left eye: Secondary | ICD-10-CM | POA: Insufficient documentation

## 2017-11-01 DIAGNOSIS — Z7951 Long term (current) use of inhaled steroids: Secondary | ICD-10-CM | POA: Insufficient documentation

## 2017-11-01 DIAGNOSIS — I119 Hypertensive heart disease without heart failure: Secondary | ICD-10-CM | POA: Insufficient documentation

## 2017-11-01 DIAGNOSIS — Z820 Family history of epilepsy and other diseases of the nervous system: Secondary | ICD-10-CM | POA: Insufficient documentation

## 2017-11-01 DIAGNOSIS — Z821 Family history of blindness and visual loss: Secondary | ICD-10-CM | POA: Insufficient documentation

## 2017-11-01 DIAGNOSIS — C9110 Chronic lymphocytic leukemia of B-cell type not having achieved remission: Secondary | ICD-10-CM | POA: Insufficient documentation

## 2017-11-01 DIAGNOSIS — M5136 Other intervertebral disc degeneration, lumbar region: Secondary | ICD-10-CM | POA: Insufficient documentation

## 2017-11-01 DIAGNOSIS — Z9841 Cataract extraction status, right eye: Secondary | ICD-10-CM | POA: Insufficient documentation

## 2017-11-01 DIAGNOSIS — Z419 Encounter for procedure for purposes other than remedying health state, unspecified: Secondary | ICD-10-CM

## 2017-11-01 DIAGNOSIS — Z90710 Acquired absence of both cervix and uterus: Secondary | ICD-10-CM | POA: Insufficient documentation

## 2017-11-01 DIAGNOSIS — K219 Gastro-esophageal reflux disease without esophagitis: Secondary | ICD-10-CM | POA: Insufficient documentation

## 2017-11-01 DIAGNOSIS — Z8249 Family history of ischemic heart disease and other diseases of the circulatory system: Secondary | ICD-10-CM | POA: Insufficient documentation

## 2017-11-01 DIAGNOSIS — Z79899 Other long term (current) drug therapy: Secondary | ICD-10-CM | POA: Insufficient documentation

## 2017-11-01 DIAGNOSIS — F329 Major depressive disorder, single episode, unspecified: Secondary | ICD-10-CM | POA: Insufficient documentation

## 2017-11-01 DIAGNOSIS — G4733 Obstructive sleep apnea (adult) (pediatric): Secondary | ICD-10-CM | POA: Insufficient documentation

## 2017-11-01 DIAGNOSIS — Z886 Allergy status to analgesic agent status: Secondary | ICD-10-CM | POA: Insufficient documentation

## 2017-11-01 DIAGNOSIS — Z888 Allergy status to other drugs, medicaments and biological substances status: Secondary | ICD-10-CM | POA: Insufficient documentation

## 2017-11-01 DIAGNOSIS — R7303 Prediabetes: Secondary | ICD-10-CM | POA: Insufficient documentation

## 2017-11-01 DIAGNOSIS — Z9889 Other specified postprocedural states: Secondary | ICD-10-CM | POA: Insufficient documentation

## 2017-11-01 HISTORY — PX: LUMBAR LAMINECTOMY/DECOMPRESSION MICRODISCECTOMY: SHX5026

## 2017-11-01 LAB — CBC
HCT: 32.7 % — ABNORMAL LOW (ref 36.0–46.0)
HEMOGLOBIN: 10.4 g/dL — AB (ref 12.0–15.0)
MCH: 30.8 pg (ref 26.0–34.0)
MCHC: 31.8 g/dL (ref 30.0–36.0)
MCV: 96.7 fL (ref 78.0–100.0)
PLATELETS: 97 10*3/uL — AB (ref 150–400)
RBC: 3.38 MIL/uL — AB (ref 3.87–5.11)
RDW: 14.2 % (ref 11.5–15.5)
WBC: 60.4 10*3/uL (ref 4.0–10.5)

## 2017-11-01 LAB — PATHOLOGIST SMEAR REVIEW

## 2017-11-01 SURGERY — LUMBAR LAMINECTOMY/DECOMPRESSION MICRODISCECTOMY 1 LEVEL
Anesthesia: General | Site: Spine Lumbar | Laterality: Left

## 2017-11-01 MED ORDER — MIDAZOLAM HCL 2 MG/2ML IJ SOLN
INTRAMUSCULAR | Status: AC
  Filled 2017-11-01: qty 2

## 2017-11-01 MED ORDER — HYDROCODONE-ACETAMINOPHEN 7.5-325 MG PO TABS
1.0000 | ORAL_TABLET | Freq: Four times a day (QID) | ORAL | Status: DC
  Administered 2017-11-01 – 2017-11-02 (×3): 1 via ORAL
  Filled 2017-11-01 (×2): qty 1

## 2017-11-01 MED ORDER — EPHEDRINE 5 MG/ML INJ
INTRAVENOUS | Status: AC
  Filled 2017-11-01: qty 10

## 2017-11-01 MED ORDER — SENNA 8.6 MG PO TABS
1.0000 | ORAL_TABLET | Freq: Two times a day (BID) | ORAL | Status: DC
Start: 2017-11-01 — End: 2017-11-02
  Administered 2017-11-01 – 2017-11-02 (×2): 8.6 mg via ORAL
  Filled 2017-11-01 (×2): qty 1

## 2017-11-01 MED ORDER — EZETIMIBE 10 MG PO TABS
10.0000 mg | ORAL_TABLET | Freq: Every day | ORAL | Status: DC
Start: 2017-11-01 — End: 2017-11-02
  Administered 2017-11-01 – 2017-11-02 (×2): 10 mg via ORAL
  Filled 2017-11-01 (×2): qty 1

## 2017-11-01 MED ORDER — SUGAMMADEX SODIUM 200 MG/2ML IV SOLN
INTRAVENOUS | Status: AC
  Filled 2017-11-01: qty 2

## 2017-11-01 MED ORDER — POTASSIUM CHLORIDE IN NACL 20-0.9 MEQ/L-% IV SOLN
INTRAVENOUS | Status: DC
  Administered 2017-11-01: 21:00:00 via INTRAVENOUS
  Filled 2017-11-01: qty 1000

## 2017-11-01 MED ORDER — CEFAZOLIN SODIUM-DEXTROSE 2-4 GM/100ML-% IV SOLN
2.0000 g | Freq: Three times a day (TID) | INTRAVENOUS | Status: AC
  Administered 2017-11-01 – 2017-11-02 (×2): 2 g via INTRAVENOUS
  Filled 2017-11-01 (×2): qty 100

## 2017-11-01 MED ORDER — SODIUM CHLORIDE 0.9 % IV SOLN: INTRAVENOUS | Status: DC

## 2017-11-01 MED ORDER — MENTHOL 3 MG MT LOZG: 1.0000 | LOZENGE | OROMUCOSAL | Status: DC | PRN

## 2017-11-01 MED ORDER — HYDROMORPHONE HCL 1 MG/ML IJ SOLN
INTRAMUSCULAR | Status: AC
  Administered 2017-11-01: 0.25 mg via INTRAVENOUS
  Filled 2017-11-01: qty 1

## 2017-11-01 MED ORDER — ONDANSETRON HCL 4 MG/2ML IJ SOLN
INTRAMUSCULAR | Status: AC
  Filled 2017-11-01: qty 2

## 2017-11-01 MED ORDER — CHLORHEXIDINE GLUCONATE CLOTH 2 % EX PADS: 6.0000 | MEDICATED_PAD | Freq: Once | CUTANEOUS | Status: DC

## 2017-11-01 MED ORDER — LACTATED RINGERS IV SOLN
INTRAVENOUS | Status: DC
  Administered 2017-11-01 (×2): via INTRAVENOUS

## 2017-11-01 MED ORDER — MIDAZOLAM HCL 5 MG/5ML IJ SOLN
INTRAMUSCULAR | Status: DC | PRN
  Administered 2017-11-01: 2 mg via INTRAVENOUS

## 2017-11-01 MED ORDER — THROMBIN (RECOMBINANT) 5000 UNITS EX SOLR
CUTANEOUS | Status: DC | PRN
  Administered 2017-11-01: 17:00:00 via TOPICAL

## 2017-11-01 MED ORDER — SUGAMMADEX SODIUM 200 MG/2ML IV SOLN
INTRAVENOUS | Status: DC | PRN
  Administered 2017-11-01: 200 mg via INTRAVENOUS

## 2017-11-01 MED ORDER — HEMOSTATIC AGENTS (NO CHARGE) OPTIME
TOPICAL | Status: DC | PRN
  Administered 2017-11-01: 1 via TOPICAL

## 2017-11-01 MED ORDER — SODIUM CHLORIDE 0.9 % IR SOLN
Status: DC | PRN
  Administered 2017-11-01: 17:00:00

## 2017-11-01 MED ORDER — ROCURONIUM BROMIDE 10 MG/ML (PF) SYRINGE
PREFILLED_SYRINGE | INTRAVENOUS | Status: AC
Start: 2017-11-01 — End: ?
  Filled 2017-11-01: qty 5

## 2017-11-01 MED ORDER — 0.9 % SODIUM CHLORIDE (POUR BTL) OPTIME
TOPICAL | Status: DC | PRN
  Administered 2017-11-01: 1000 mL

## 2017-11-01 MED ORDER — DEXAMETHASONE SODIUM PHOSPHATE 4 MG/ML IJ SOLN
4.0000 mg | Freq: Four times a day (QID) | INTRAMUSCULAR | Status: DC
Start: 2017-11-01 — End: 2017-11-02

## 2017-11-01 MED ORDER — ONDANSETRON HCL 4 MG PO TABS: 4.0000 mg | ORAL_TABLET | Freq: Four times a day (QID) | ORAL | Status: DC | PRN

## 2017-11-01 MED ORDER — DICYCLOMINE HCL 10 MG PO CAPS
10.0000 mg | ORAL_CAPSULE | Freq: Three times a day (TID) | ORAL | Status: DC | PRN
  Filled 2017-11-01: qty 1

## 2017-11-01 MED ORDER — LIDOCAINE 2% (20 MG/ML) 5 ML SYRINGE
INTRAMUSCULAR | Status: AC
Start: 2017-11-01 — End: ?
  Filled 2017-11-01: qty 5

## 2017-11-01 MED ORDER — ONDANSETRON HCL 4 MG/2ML IJ SOLN: 4.0000 mg | Freq: Four times a day (QID) | INTRAMUSCULAR | Status: DC | PRN

## 2017-11-01 MED ORDER — DEXAMETHASONE SODIUM PHOSPHATE 10 MG/ML IJ SOLN
INTRAMUSCULAR | Status: AC
  Filled 2017-11-01: qty 1

## 2017-11-01 MED ORDER — PROPOFOL 10 MG/ML IV BOLUS
INTRAVENOUS | Status: DC | PRN
  Administered 2017-11-01: 120 mg via INTRAVENOUS

## 2017-11-01 MED ORDER — ONDANSETRON HCL 4 MG/2ML IJ SOLN: 4.0000 mg | Freq: Once | INTRAMUSCULAR | Status: DC | PRN

## 2017-11-01 MED ORDER — BUPIVACAINE HCL (PF) 0.25 % IJ SOLN
INTRAMUSCULAR | Status: AC
  Filled 2017-11-01: qty 30

## 2017-11-01 MED ORDER — ARTIFICIAL TEARS OPHTHALMIC OINT
TOPICAL_OINTMENT | OPHTHALMIC | Status: DC | PRN
Start: 2017-11-01 — End: 2017-11-01
  Administered 2017-11-01: 1 via OPHTHALMIC

## 2017-11-01 MED ORDER — ROCURONIUM BROMIDE 100 MG/10ML IV SOLN
INTRAVENOUS | Status: DC | PRN
  Administered 2017-11-01: 100 mg via INTRAVENOUS

## 2017-11-01 MED ORDER — PHENOL 1.4 % MT LIQD: 1.0000 | OROMUCOSAL | Status: DC | PRN

## 2017-11-01 MED ORDER — HYDROMORPHONE HCL 1 MG/ML IJ SOLN
0.2500 mg | INTRAMUSCULAR | Status: DC | PRN
  Administered 2017-11-01 (×2): 0.25 mg via INTRAVENOUS
  Administered 2017-11-01: 0.5 mg via INTRAVENOUS

## 2017-11-01 MED ORDER — HYDROCODONE-ACETAMINOPHEN 7.5-325 MG PO TABS
ORAL_TABLET | ORAL | Status: AC
  Administered 2017-11-01: 1 via ORAL
  Filled 2017-11-01: qty 1

## 2017-11-01 MED ORDER — BUPIVACAINE HCL (PF) 0.25 % IJ SOLN
INTRAMUSCULAR | Status: DC | PRN
Start: 2017-11-01 — End: 2017-11-01
  Administered 2017-11-01: 3 mL

## 2017-11-01 MED ORDER — ONDANSETRON HCL 4 MG/2ML IJ SOLN
INTRAMUSCULAR | Status: DC | PRN
  Administered 2017-11-01: 4 mg via INTRAVENOUS

## 2017-11-01 MED ORDER — ACETAMINOPHEN 650 MG RE SUPP
650.0000 mg | RECTAL | Status: DC | PRN
Start: 2017-11-01 — End: 2017-11-02

## 2017-11-01 MED ORDER — ACETAMINOPHEN 325 MG PO TABS: 650.0000 mg | ORAL_TABLET | ORAL | Status: DC | PRN

## 2017-11-01 MED ORDER — CEFAZOLIN SODIUM-DEXTROSE 2-4 GM/100ML-% IV SOLN
2.0000 g | INTRAVENOUS | Status: AC
  Administered 2017-11-01: 2 g via INTRAVENOUS
  Filled 2017-11-01: qty 100

## 2017-11-01 MED ORDER — OXYCODONE HCL 5 MG PO TABS
5.0000 mg | ORAL_TABLET | ORAL | Status: DC | PRN
  Administered 2017-11-01 – 2017-11-02 (×2): 5 mg via ORAL
  Filled 2017-11-01 (×2): qty 1

## 2017-11-01 MED ORDER — EPHEDRINE SULFATE 50 MG/ML IJ SOLN
INTRAMUSCULAR | Status: DC | PRN
  Administered 2017-11-01: 10 mg via INTRAVENOUS

## 2017-11-01 MED ORDER — PROPOFOL 10 MG/ML IV BOLUS
INTRAVENOUS | Status: AC
Start: 2017-11-01 — End: ?
  Filled 2017-11-01: qty 20

## 2017-11-01 MED ORDER — SODIUM CHLORIDE 0.9% FLUSH: 3.0000 mL | INTRAVENOUS | Status: DC | PRN

## 2017-11-01 MED ORDER — METHYLPREDNISOLONE ACETATE 80 MG/ML IJ SUSP
INTRAMUSCULAR | Status: AC
  Filled 2017-11-01: qty 1

## 2017-11-01 MED ORDER — FENTANYL CITRATE (PF) 100 MCG/2ML IJ SOLN
INTRAMUSCULAR | Status: DC | PRN
  Administered 2017-11-01: 50 ug via INTRAVENOUS
  Administered 2017-11-01: 100 ug via INTRAVENOUS
  Administered 2017-11-01 (×4): 50 ug via INTRAVENOUS

## 2017-11-01 MED ORDER — ESCITALOPRAM OXALATE 10 MG PO TABS
10.0000 mg | ORAL_TABLET | Freq: Every day | ORAL | Status: DC
  Administered 2017-11-02: 10 mg via ORAL
  Filled 2017-11-01: qty 1

## 2017-11-01 MED ORDER — SODIUM CHLORIDE 0.9 % IV SOLN: 250.0000 mL | INTRAVENOUS | Status: DC

## 2017-11-01 MED ORDER — METHYLPREDNISOLONE ACETATE 80 MG/ML IJ SUSP
INTRAMUSCULAR | Status: DC | PRN
  Administered 2017-11-01: 80 mg

## 2017-11-01 MED ORDER — FENTANYL CITRATE (PF) 250 MCG/5ML IJ SOLN
INTRAMUSCULAR | Status: AC
  Filled 2017-11-01: qty 5

## 2017-11-01 MED ORDER — SODIUM CHLORIDE 0.9% FLUSH: 3.0000 mL | Freq: Two times a day (BID) | INTRAVENOUS | Status: DC

## 2017-11-01 MED ORDER — DEXAMETHASONE SODIUM PHOSPHATE 10 MG/ML IJ SOLN
10.0000 mg | INTRAMUSCULAR | Status: AC
  Administered 2017-11-01: 10 mg via INTRAVENOUS
  Filled 2017-11-01: qty 1

## 2017-11-01 MED ORDER — DEXAMETHASONE 4 MG PO TABS
4.0000 mg | ORAL_TABLET | Freq: Four times a day (QID) | ORAL | Status: DC
  Administered 2017-11-02 (×2): 4 mg via ORAL
  Filled 2017-11-01 (×2): qty 1

## 2017-11-01 MED ORDER — MORPHINE SULFATE (PF) 4 MG/ML IV SOLN: 2.0000 mg | INTRAVENOUS | Status: DC | PRN

## 2017-11-01 MED ORDER — FUROSEMIDE 20 MG PO TABS: 20.0000 mg | ORAL_TABLET | Freq: Every day | ORAL | Status: DC | PRN

## 2017-11-01 MED ORDER — THROMBIN (RECOMBINANT) 5000 UNITS EX SOLR
CUTANEOUS | Status: AC
  Filled 2017-11-01: qty 5000

## 2017-11-01 MED ORDER — MEPERIDINE HCL 25 MG/ML IJ SOLN: 6.2500 mg | INTRAMUSCULAR | Status: DC | PRN

## 2017-11-01 MED ORDER — METHOCARBAMOL 1000 MG/10ML IJ SOLN
500.0000 mg | Freq: Four times a day (QID) | INTRAVENOUS | Status: DC | PRN
  Filled 2017-11-01: qty 5

## 2017-11-01 MED ORDER — BISOPROLOL-HYDROCHLOROTHIAZIDE 10-6.25 MG PO TABS
1.0000 | ORAL_TABLET | Freq: Every day | ORAL | Status: DC
  Administered 2017-11-01 – 2017-11-02 (×2): 1 via ORAL
  Filled 2017-11-01 (×2): qty 1

## 2017-11-01 MED ORDER — LIDOCAINE HCL (CARDIAC) 20 MG/ML IV SOLN
INTRAVENOUS | Status: DC | PRN
  Administered 2017-11-01: 100 mg via INTRAVENOUS

## 2017-11-01 MED ORDER — METHOCARBAMOL 500 MG PO TABS: 500.0000 mg | ORAL_TABLET | Freq: Four times a day (QID) | ORAL | Status: DC | PRN

## 2017-11-01 MED ORDER — PANTOPRAZOLE SODIUM 40 MG PO TBEC
40.0000 mg | DELAYED_RELEASE_TABLET | Freq: Every morning | ORAL | Status: DC
Start: 2017-11-02 — End: 2017-11-02
  Administered 2017-11-02: 40 mg via ORAL
  Filled 2017-11-01: qty 1

## 2017-11-01 SURGICAL SUPPLY — 45 items
BAG DECANTER FOR FLEXI CONT (MISCELLANEOUS) ×2 IMPLANT
BENZOIN TINCTURE PRP APPL 2/3 (GAUZE/BANDAGES/DRESSINGS) ×2 IMPLANT
BUR MATCHSTICK NEURO 3.0 LAGG (BURR) ×2 IMPLANT
CANISTER SUCT 3000ML PPV (MISCELLANEOUS) ×2 IMPLANT
CARTRIDGE OIL MAESTRO DRILL (MISCELLANEOUS) ×1 IMPLANT
DERMABOND ADVANCED (GAUZE/BANDAGES/DRESSINGS) ×1
DERMABOND ADVANCED .7 DNX12 (GAUZE/BANDAGES/DRESSINGS) ×1 IMPLANT
DIFFUSER DRILL AIR PNEUMATIC (MISCELLANEOUS) ×2 IMPLANT
DRAPE LAPAROTOMY 100X72X124 (DRAPES) ×2 IMPLANT
DRAPE MICROSCOPE LEICA (MISCELLANEOUS) ×2 IMPLANT
DRAPE POUCH INSTRU U-SHP 10X18 (DRAPES) ×2 IMPLANT
DRAPE SURG 17X23 STRL (DRAPES) ×2 IMPLANT
DRSG OPSITE POSTOP 3X4 (GAUZE/BANDAGES/DRESSINGS) ×2 IMPLANT
DURAPREP 26ML APPLICATOR (WOUND CARE) ×2 IMPLANT
ELECT REM PT RETURN 9FT ADLT (ELECTROSURGICAL) ×2
ELECTRODE REM PT RTRN 9FT ADLT (ELECTROSURGICAL) ×1 IMPLANT
GAUZE SPONGE 4X4 16PLY XRAY LF (GAUZE/BANDAGES/DRESSINGS) IMPLANT
GLOVE BIO SURGEON STRL SZ7 (GLOVE) IMPLANT
GLOVE BIO SURGEON STRL SZ8 (GLOVE) ×2 IMPLANT
GLOVE BIOGEL PI IND STRL 7.0 (GLOVE) IMPLANT
GLOVE BIOGEL PI INDICATOR 7.0 (GLOVE)
GOWN STRL REUS W/ TWL LRG LVL3 (GOWN DISPOSABLE) IMPLANT
GOWN STRL REUS W/ TWL XL LVL3 (GOWN DISPOSABLE) ×1 IMPLANT
GOWN STRL REUS W/TWL 2XL LVL3 (GOWN DISPOSABLE) IMPLANT
GOWN STRL REUS W/TWL LRG LVL3 (GOWN DISPOSABLE)
GOWN STRL REUS W/TWL XL LVL3 (GOWN DISPOSABLE) ×1
HEMOSTAT POWDER KIT SURGIFOAM (HEMOSTASIS) IMPLANT
KIT BASIN OR (CUSTOM PROCEDURE TRAY) ×2 IMPLANT
KIT ROOM TURNOVER OR (KITS) ×2 IMPLANT
NEEDLE HYPO 25X1 1.5 SAFETY (NEEDLE) ×2 IMPLANT
NEEDLE SPNL 20GX3.5 QUINCKE YW (NEEDLE) IMPLANT
NS IRRIG 1000ML POUR BTL (IV SOLUTION) ×2 IMPLANT
OIL CARTRIDGE MAESTRO DRILL (MISCELLANEOUS) ×2
PACK LAMINECTOMY NEURO (CUSTOM PROCEDURE TRAY) ×2 IMPLANT
PAD ARMBOARD 7.5X6 YLW CONV (MISCELLANEOUS) ×6 IMPLANT
RUBBERBAND STERILE (MISCELLANEOUS) ×4 IMPLANT
SPONGE SURGIFOAM ABS GEL SZ50 (HEMOSTASIS) ×2 IMPLANT
STRIP CLOSURE SKIN 1/2X4 (GAUZE/BANDAGES/DRESSINGS) ×2 IMPLANT
SUT VIC AB 0 CT1 18XCR BRD8 (SUTURE) ×1 IMPLANT
SUT VIC AB 0 CT1 8-18 (SUTURE) ×1
SUT VIC AB 2-0 CP2 18 (SUTURE) ×2 IMPLANT
SUT VIC AB 3-0 SH 8-18 (SUTURE) ×2 IMPLANT
TOWEL GREEN STERILE (TOWEL DISPOSABLE) ×2 IMPLANT
TOWEL GREEN STERILE FF (TOWEL DISPOSABLE) ×2 IMPLANT
WATER STERILE IRR 1000ML POUR (IV SOLUTION) ×2 IMPLANT

## 2017-11-01 NOTE — Op Note (Signed)
11/01/2017  6:07 PM  PATIENT:  Rebecca Bond  71 y.o. female  PRE-OPERATIVE DIAGNOSIS:  Left L4-5 extraforaminal disc herniation with left leg pain  POST-OPERATIVE DIAGNOSIS:  same  PROCEDURE:  Left L4-5 extraforaminal decompression and microdiscectomy utilizing microscopic dissection  SURGEON:  Sherley Bounds, MD  ASSISTANTS: Meyran FNP  ANESTHESIA:   General  EBL: 100 ml  Total I/O In: 0623 [I.V.:1500; Blood:186] Out: 100 [Blood:100]  BLOOD ADMINISTERED: none  DRAINS: none  SPECIMEN:  none  INDICATION FOR PROCEDURE: This patient presented with severe left leg pain. Imaging showed a large left L4-5 extruded foraminal disc herniation. The patient tried conservative measures without relief. Pain was debilitating. Recommended left L4-5 extruded foraminal discectomy. Patient understood the risks, benefits, and alternatives and potential outcomes and wished to proceed.  PROCEDURE DETAILS: The patient was taken to the operating room and after induction of adequate generalized endotracheal anesthesia, the patient was rolled into the prone position on the Wilson frame and all pressure points were padded. The lumbar region was cleaned and then prepped with DuraPrep and draped in the usual sterile fashion. 5 cc of local anesthesia was injected and then a dorsal midline incision was made and carried down to the lumbo sacral fascia. The fascia was opened and the paraspinous musculature was taken down in a subperiosteal fashion to expose L4-5 on the left. The first Intraoperative x-ray showed that I was at the pedicle level of L5 so I moved up 1 disc space to the lateral pars of L4 just below the L4 pedicle. Intraoperative x-ray was repeated and confirmed my level, and then I used a combination of the high-speed drill and the Kerrison punches drill the superior part of the facet of L4-5 on the lateral part of the pars. The underlying yellow ligament was opened and removed in a piecemeal fashion  to expose the underlying exiting L4 nerve root. The nerve root was well decompressed. We then gently retracted the nerve root superiorly with the sucker, coagulated the epidural venous vasculature, and incised the disc space after finding a large bulging annulus inferior to the nerve. I performed a thorough intradiscal discectomy with pituitary rongeurs, until I had a nice decompression of the nerve root and the midline. I then palpated with a coronary dilator along the nerve root and into the foramen to assure adequate decompression. I felt no more compression of the nerve root. I irrigated with saline solution containing bacitracin. Achieved hemostasis with bipolar cautery, lined the dura with Depo-Medrol, and then closed the fascia with 0 Vicryl. I closed the subcutaneous tissues with 2-0 Vicryl and the subcuticular tissues with 3-0 Vicryl. The skin was then closed with benzoin and Steri-Strips. The drapes were removed, a sterile dressing was applied. The patient was awakened from general anesthesia and transferred to the recovery room in stable condition. At the end of the procedure all sponge, needle and instrument counts were correct.    PLAN OF CARE: Overnight observation  PATIENT DISPOSITION:  PACU - hemodynamically stable.   Delay start of Pharmacological VTE agent (>24hrs) due to surgical blood loss or risk of bleeding:  yes

## 2017-11-01 NOTE — Progress Notes (Signed)
This note also relates to the following rows which could not be included: Rate - Cannot attach notes to extension rows Line - Cannot attach notes to extension rows  Platelets started at 1340

## 2017-11-01 NOTE — H&P (Signed)
Subjective: Patient is a 71 y.o. female admitted for leg pain. Onset of symptoms was several months ago, gradually worsening since that time.  The pain is rated severe, unremitting, and is located at the across the lower back and radiates to leg. The pain is described as aching and occurs all day. The symptoms have been progressive. Symptoms are exacerbated by exercise. MRI or CT showed extraforaminal HNP   Past Medical History:  Diagnosis Date  . Anemia   . Anxiety   . Arthritis   . CLL (chronic lymphocytic leukemia) ([place])   . Depression   . GERD (gastroesophageal reflux disease)   . Headache    "sinus headaches"  . History of kidney stones   . Hypertension   . OSA (obstructive sleep apnea)   . Pneumonia   . Prediabetes   . Recurrent sinus infections 08/02/2012    Past Surgical History:  Procedure Laterality Date  . ABDOMINAL HYSTERECTOMY  1980's   "endometrosis"  . APPENDECTOMY    . BREAST SURGERY    . CATARACT EXTRACTION, BILATERAL    . CHOLECYSTECTOMY  1985  . FUNCTIONAL ENDOSCOPIC SINUS SURGERY  1990's   "cause I kept having sinus infections"  . immunoglobulin treatment  2017  . LAPAROSCOPIC APPENDECTOMY N/A 06/04/2014   Procedure: APPENDECTOMY LAPAROSCOPIC;  Surgeon: Zenovia Jarred, MD;  Location: [facility];  Service: General;  Laterality: N/A;  . LYMPH NODE BIOPSY     "determined I had CLL"  . SHOULDER ARTHROSCOPY Right   . TEMPOROMANDIBULAR JOINT ARTHROPLASTY  1980's  . TONSILLECTOMY AND ADENOIDECTOMY  1950's    Prior to Admission medications   Medication Sig Start Date End Date Taking? Authorizing Provider  ALPRAZolam (XANAX) 1 MG tablet TAKE 1/2 TO 1 TABLET BY MOUTH THREE TIMES A DAY AS NEEDED Patient taking differently: TAKE 1/2 TABLET BY MOUTH THREE TIMES A DAY AS NEEDED FOR ANXIETY 08/14/17  Yes [name], MD  Ascorbic Acid (VITAMIN C) 1000 MG tablet Take 1,000 mg daily by mouth.   Yes [provider]  bisoprolol-hydrochlorothiazide [facility]) 10-6.25 MG  tablet TAKE ONE TABLET BY MOUTH DAILY 07/22/17  Yes [name], MD  Cholecalciferol (D 2000) 2000 units TABS Take 6,000-8,000 Units daily by mouth.   Yes [provider]  dicyclomine (BENTYL) 10 MG capsule Take 10 mg 3 (three) times daily as needed by mouth for spasms.   Yes [provider]  escitalopram (LEXAPRO) 10 MG tablet TAKE ONE TABLET BY MOUTH DAILY FOR MOOD 10/23/17  Yes [name], MD  ezetimibe (ZETIA) 10 MG tablet TAKE ONE TABLET BY MOUTH DAILY 10/23/17  Yes [name], MD  fexofenadine (ALLEGRA) 180 MG tablet Take 1 tablet (180 mg total) by mouth daily. Take PRN for allergies. 06/01/15  Yes [name], MD  furosemide (LASIX) 40 MG tablet Take 20 mg daily as needed by mouth for fluid or edema.    Yes [provider]  pantoprazole (PROTONIX) 40 MG tablet TAKE ONE TABLET BY MOUTH EVERY MORNING Patient taking differently: TAKE ONE TABLET BY MOUTH DAILY AS NEEDED FOR ACID REFLUX 10/03/17  Yes Esterwood, Amy S, PA-C  Polyethyl Glycol-Propyl Glycol (SYSTANE OP) Apply 2 drops daily to eye.   Yes [provider]  Probiotic Product (PROBIOTIC PO) Take 1 tablet daily by mouth.   Yes [provider]  triamcinolone (NASACORT) 55 MCG/ACT AERO nasal inhaler Place 2 sprays daily as needed into the nose.   Yes [provider]   Allergies  Allergen Reactions  .  Hyzaar [Losartan Potassium-Hctz] Other (See Comments)    "messed up my sodium counts" swells up lips.  [name] [Pseudoephedrine Hcl] Palpitations  . Levaquin [Levofloxacin In D5w] Diarrhea and Nausea Only  . Biaxin [Clarithromycin]     GI Upset  . Celexa [Citalopram]   . Flexeril [Cyclobenzaprine]     "zombie-like" feeling  . Fosamax [Alendronate Sodium]     GI upset  . Iohexol Hives     Code: HIVES, Desc: pt states she broke out in hive 20 yrs ago from IV contrast.     . Losartan     Angioedema  . Meloxicam     GI upset  . Norvasc [Amlodipine] Swelling   . Pseudoephedrine     Palpitations  . Zoloft [Sertraline Hcl] Other (See Comments)    Has no emotions at all     Social History   Tobacco Use  . Smoking status: Former Smoker    Packs/day: 0.75    Years: 4.00    Pack years: 3.00    Types: Cigarettes    Last attempt to quit: 12/05/1968    Years since quitting: 48.9  . Smokeless tobacco: Never Used  Substance Use Topics  . Alcohol use: Yes    Alcohol/week: 4.2 oz    Types: 7 Glasses of wine per week    Family History  Problem Relation Age of Onset  . Parkinson's disease Mother   . Hypertension Mother   . Hypertension Maternal Grandmother   . Heart attack Maternal Grandmother        Mild     Review of Systems  Positive ROS: neg  All other systems have been reviewed and were otherwise negative with the exception of those mentioned in the HPI and as above.  Objective: Vital signs in last 24 hours: Temp:  [98.2 F (36.8 C)-98.3 F (36.8 C)] 98.3 F (36.8 C) (11/28 1401) Pulse Rate:  [58-63] 62 (11/28 1401) Resp:  [18] 18 (11/28 1401) BP: (169-187)/(51-101) 187/57 (11/28 1401) SpO2:  [96 %-98 %] 96 % (11/28 1401) Weight:  [97.5 kg (215 lb)] 97.5 kg (215 lb) (11/28 1248)  General Appearance: Alert, cooperative, no distress, appears stated age Head: Normocephalic, without obvious abnormality, atraumatic Eyes: PERRL, conjunctiva/corneas clear, EOM's intact    Neck: Supple, symmetrical, trachea midline Back: Symmetric, no curvature, ROM normal, no CVA tenderness Lungs:  respirations unlabored Heart: Regular rate and rhythm Abdomen: Soft, non-tender Extremities: Extremities normal, atraumatic, no cyanosis or edema Pulses: 2+ and symmetric all extremities Skin: Skin color, texture, turgor normal, no rashes or lesions  NEUROLOGIC:   Mental status: Alert and oriented x4,  no aphasia, good attention span, fund of knowledge, and memory Motor Exam - grossly normal Sensory Exam - grossly normal Reflexes: 1+ Coordination  - grossly normal Gait - grossly normal Balance - grossly normal Cranial Nerves: I: smell Not tested  II: visual acuity  OS: nl    OD: nl  II: visual fields Full to confrontation  II: pupils Equal, round, reactive to light  III,VII: ptosis None  III,IV,VI: extraocular muscles  Full ROM  V: mastication Normal  V: facial light touch sensation  Normal  V,VII: corneal reflex  Present  VII: facial muscle function - upper  Normal  VII: facial muscle function - lower Normal  VIII: hearing Not tested  IX: soft palate elevation  Normal  IX,X: gag reflex Present  XI: trapezius strength  5/5  XI: sternocleidomastoid strength 5/5  XI: neck flexion strength  5/5  XII: tongue strength  Normal    Data Review Lab Results  Component Value Date   WBC 60.4 (HH) 11/01/2017   HGB 10.4 (L) 11/01/2017   HCT 32.7 (L) 11/01/2017   MCV 96.7 11/01/2017   PLT PENDING 11/01/2017   Lab Results  Component Value Date   NA 133 (L) 10/30/2017   K 4.8 10/30/2017   CL 99 (L) 10/30/2017   CO2 27 10/30/2017   BUN 17 10/30/2017   CREATININE 0.81 10/30/2017   GLUCOSE 96 10/30/2017   Lab Results  Component Value Date   INR 1.00 10/30/2017    Assessment/Plan: Patient admitted for L4-5 extraforaminal diskectomy. Patient has failed a reasonable attempt at conservative therapy.  I explained the condition and procedure to the patient and answered any questions.  Patient wishes to proceed with procedure as planned. Understands risks/ benefits and typical outcomes of procedure.   Harvel Meskill S 11/01/2017 3:46 PM

## 2017-11-01 NOTE — Anesthesia Preprocedure Evaluation (Signed)
Anesthesia Evaluation  Patient identified by MRN, date of birth, ID band Patient awake    Reviewed: Allergy & Precautions, NPO status , Patient's Chart, lab work & pertinent test results  Airway Mallampati: II  TM Distance: >3 FB Neck ROM: Full    Dental   Pulmonary sleep apnea , former smoker,    Pulmonary exam normal        Cardiovascular hypertension, Pt. on medications Normal cardiovascular exam     Neuro/Psych Anxiety Depression    GI/Hepatic GERD  Medicated and Controlled,  Endo/Other    Renal/GU      Musculoskeletal   Abdominal   Peds  Hematology   Anesthesia Other Findings   Reproductive/Obstetrics                             Anesthesia Physical Anesthesia Plan  ASA: III  Anesthesia Plan: General   Post-op Pain Management:    Induction: Intravenous  PONV Risk Score and Plan: 3  Airway Management Planned: Oral ETT  Additional Equipment:   Intra-op Plan:   Post-operative Plan: Extubation in OR  Informed Consent: I have reviewed the patients History and Physical, chart, labs and discussed the procedure including the risks, benefits and alternatives for the proposed anesthesia with the patient or authorized representative who has indicated his/her understanding and acceptance.     Plan Discussed with: CRNA and Surgeon  Anesthesia Plan Comments:         Anesthesia Quick Evaluation

## 2017-11-01 NOTE — Progress Notes (Signed)
HR 58 Ziac not taken at home today and not given at this time.

## 2017-11-01 NOTE — Transfer of Care (Signed)
Immediate Anesthesia Transfer of Care Note  Patient: Rebecca Bond  Procedure(s) Performed: Left Lumbar Four-Five Extraforaminal Microdiscectomy (Left Spine Lumbar)  Patient Location: PACU  Anesthesia Type:General  Level of Consciousness: awake and patient cooperative  Airway & Oxygen Therapy: Patient Spontanous Breathing  Post-op Assessment: Report given to RN and Post -op Vital signs reviewed and stable  Post vital signs: Reviewed and stable  Last Vitals:  Vitals:   11/01/17 1401 11/01/17 1816  BP: (!) 187/57 (!) 154/87  Pulse: 62 81  Resp: 18 19  Temp: 36.8 C (!) 36.1 C  SpO2: 96% 95%    Last Pain:  Vitals:   11/01/17 1401  TempSrc: Oral         Complications: No apparent anesthesia complications

## 2017-11-01 NOTE — Anesthesia Procedure Notes (Signed)
Procedure Name: Intubation Date/Time: 11/01/2017 4:35 PM Performed by: Lance Coon, CRNA Pre-anesthesia Checklist: Patient identified, Emergency Drugs available, Suction available, Patient being monitored and Timeout performed Patient Re-evaluated:Patient Re-evaluated prior to induction Oxygen Delivery Method: Circle system utilized Preoxygenation: Pre-oxygenation with 100% oxygen Induction Type: IV induction Ventilation: Mask ventilation without difficulty Laryngoscope Size: Miller and 2 Grade View: Grade I Tube type: Oral Tube size: 7.0 mm Number of attempts: 1 Airway Equipment and Method: Stylet Placement Confirmation: ETT inserted through vocal cords under direct vision,  positive ETCO2 and breath sounds checked- equal and bilateral Secured at: 21 cm Tube secured with: Tape Dental Injury: Teeth and Oropharynx as per pre-operative assessment

## 2017-11-02 ENCOUNTER — Encounter (HOSPITAL_COMMUNITY): Payer: Self-pay | Admitting: Neurological Surgery

## 2017-11-02 DIAGNOSIS — M5126 Other intervertebral disc displacement, lumbar region: Secondary | ICD-10-CM | POA: Diagnosis not present

## 2017-11-02 LAB — BPAM PLATELET PHERESIS
Blood Product Expiration Date: 201811292359
ISSUE DATE / TIME: 201811281311
UNIT TYPE AND RH: 5100

## 2017-11-02 LAB — PREPARE PLATELET PHERESIS: Unit division: 0

## 2017-11-02 MED ORDER — HYDROCODONE-ACETAMINOPHEN 7.5-325 MG PO TABS: 1.0000 | ORAL_TABLET | Freq: Four times a day (QID) | ORAL | 0 refills | Status: DC

## 2017-11-02 MED ORDER — METHOCARBAMOL 500 MG PO TABS: 500.0000 mg | ORAL_TABLET | Freq: Four times a day (QID) | ORAL | 0 refills | Status: DC | PRN

## 2017-11-02 NOTE — Evaluation (Signed)
Occupational Therapy Evaluation and Discharge Patient Details Name: Rebecca Bond MRN: [id number] DOB: 07-31-1946 Today's Date: 11/02/2017    History of Present Illness Pt is a 71 y.o. female s/p L4-5 decompression and microdiscectomy. PMHx: Anxiety, Arthritis, CLL, Depression, GERD, Headache, HTN, OSA, R shoulder arthroscopy.    Clinical Impression   Pt reports she was independent with ADL PTA. Currently pt supervision with ADL and functional mobility with the exception of min assist for dressing-husband to assist upon return home. All back, safety, and ADL education completed with pt. Pt planning to d/c home with 24/7 supervision from family. No further acute OT needs identified; signing off at this time. Please re-consult if needs change. Thank you for this referral.     Follow Up Recommendations  No OT follow up;Supervision - Intermittent    Equipment Recommendations  None recommended by OT    Recommendations for Other Services       Precautions / Restrictions Precautions Precautions: Back Precaution Booklet Issued: No(Already in room) Precaution Comments: Reviewed back precautions with pt Restrictions Weight Bearing Restrictions: No      Mobility Bed Mobility Overal bed mobility: Needs Assistance Bed Mobility: Rolling;Sidelying to Sit Rolling: Supervision Sidelying to sit: Supervision       General bed mobility comments: for safety, cues for safety and technique. HOB flat without use of bed rail  Transfers Overall transfer level: Needs assistance Equipment used: None Transfers: Sit to/from Stand Sit to Stand: Supervision         General transfer comment: supervision for safety. cues for hand placement    Balance Overall balance assessment: Needs assistance Sitting-balance support: Feet supported;No upper extremity supported Sitting balance-Leahy Scale: Good     Standing balance support: During functional activity;No upper extremity  supported Standing balance-Leahy Scale: Good                             ADL either performed or assessed with clinical judgement   ADL Overall ADL's : Needs assistance/impaired Eating/Feeding: Set up;Sitting   Grooming: Supervision/safety;Standing Grooming Details (indicate cue type and reason): Educated on use of 2 cups for oral care Upper Body Bathing: Supervision/ safety;Sitting   Lower Body Bathing: Supervison/ safety;Sit to/from stand Lower Body Bathing Details (indicate cue type and reason): Educated on use of long handled sponge Upper Body Dressing : Minimal assistance;Sitting Upper Body Dressing Details (indicate cue type and reason): for bra Lower Body Dressing: Minimal assistance;Sit to/from stand Lower Body Dressing Details (indicate cue type and reason): to start clothing over feet Toilet Transfer: Supervision/safety;Ambulation;Regular Glass blower/designer Details (indicate cue type and reason): Simulated by sit to stand from EOB with functional mobility Toileting- Clothing Manipulation and Hygiene: Supervision/safety;Sit to/from stand Toileting - Clothing Manipulation Details (indicate cue type and reason): Educated on proper technique for peri care without twisting     Functional mobility during ADLs: Supervision/safety General ADL Comments: Educated pt on maintaining back precautions during functional activities, log roll for bed mobility, frequent mobility throughout the day upon return home.      Vision         Perception     Praxis      Pertinent Vitals/Pain Pain Assessment: Faces Faces Pain Scale: Hurts little more Pain Location: back Pain Descriptors / Indicators: Discomfort;Sore Pain Intervention(s): Monitored during session;Repositioned     Hand Dominance     Extremity/Trunk Assessment Upper Extremity Assessment Upper Extremity Assessment: Overall WFL for tasks assessed   Lower  Extremity Assessment Lower Extremity Assessment:  Overall WFL for tasks assessed   Cervical / Trunk Assessment Cervical / Trunk Assessment: Other exceptions Cervical / Trunk Exceptions: s/p spine sx   Communication Communication Communication: No difficulties   Cognition Arousal/Alertness: Awake/alert Behavior During Therapy: Anxious Overall Cognitive Status: Within Functional Limits for tasks assessed                                     General Comments       Exercises     Shoulder Instructions      Home Living Family/patient expects to be discharged to:: Private residence Living Arrangements: Spouse/significant other Available Help at Discharge: Family Type of Home: House Home Access: Stairs to enter Technical brewer of Steps: 2   Home Layout: Two level;Bed/bath upstairs     Bathroom Shower/Tub: Occupational psychologist: Standard     Home Equipment: None          Prior Functioning/Environment Level of Independence: Independent                 OT Problem List:        OT Treatment/Interventions:      OT Goals(Current goals can be found in the care plan section) Acute Rehab OT Goals Patient Stated Goal: home today OT Goal Formulation: All assessment and education complete, DC therapy  OT Frequency:     Barriers to D/C:            Co-evaluation              AM-PAC PT "6 Clicks" Daily Activity     Outcome Measure Help from another person eating meals?: None Help from another person taking care of personal grooming?: A Little Help from another person toileting, which includes using toliet, bedpan, or urinal?: A Little Help from another person bathing (including washing, rinsing, drying)?: A Little Help from another person to put on and taking off regular upper body clothing?: A Little Help from another person to put on and taking off regular lower body clothing?: A Little 6 Click Score: 19   End of Session Nurse Communication: Mobility status;Other  (comment)(no equipment or f/u needs)  Activity Tolerance: Patient tolerated treatment well Patient left: in chair;with call bell/phone within reach  OT Visit Diagnosis: Unsteadiness on feet (R26.81);Pain Pain - part of body: (back)                Time: 1016-1040 OT Time Calculation (min): 24 min Charges:  OT General Charges $OT Visit: 1 Visit OT Evaluation $OT Eval Moderate Complexity: 1 Mod OT Treatments $Self Care/Home Management : 8-22 mins G-Codes: OT G-codes **NOT FOR INPATIENT CLASS** Functional Assessment Tool Used: Clinical judgement Functional Limitation: Self care Self Care Current Status (T5573): At least 1 percent but less than 20 percent impaired, limited or restricted Self Care Goal Status (U2025): At least 1 percent but less than 20 percent impaired, limited or restricted Self Care Discharge Status [phone]): At least 1 percent but less than 20 percent impaired, limited or restricted   Mel Almond A. Ulice Brilliant, M.S., OTR/L Pager: [place] 11/02/2017, 10:52 AM

## 2017-11-02 NOTE — Anesthesia Postprocedure Evaluation (Signed)
Anesthesia Post Note  Patient: Rebecca Bond  Procedure(s) Performed: Left Lumbar Four-Five Extraforaminal Microdiscectomy (Left Spine Lumbar)     Patient location during evaluation: PACU Anesthesia Type: General Level of consciousness: awake and alert Pain management: pain level controlled Vital Signs Assessment: post-procedure vital signs reviewed and stable Respiratory status: spontaneous breathing, nonlabored ventilation, respiratory function stable and patient connected to nasal cannula oxygen Cardiovascular status: blood pressure returned to baseline and stable Postop Assessment: no apparent nausea or vomiting Anesthetic complications: no    Last Vitals:  Vitals:   11/02/17 0405 11/02/17 0818  BP: (!) 152/63   Pulse: 62   Resp: 18   Temp: 36.5 C 36.4 C  SpO2: 95%     Last Pain:  Vitals:   11/02/17 0818  TempSrc: Oral  PainSc:                  Gerardine Peltz DAVID

## 2017-11-02 NOTE — Progress Notes (Signed)
RT set up CPAP and placed on patient using patient's home mask (nasal pillows).  2L O2 bled into CPAP circuit. Patient tolerating well at this time. RT will monitor as needed.

## 2017-11-02 NOTE — Progress Notes (Signed)
Discharge instructions,RX's, and follow up appts explained and provided to patient and husband verbalized understanding. Patient left floor via wheelchair accompanied by staff no c/o pain or shortness of breath.  Pooja Camuso [employer]

## 2017-11-02 NOTE — Progress Notes (Signed)
Pt arrived to unit via PACU wheelchair and staff. Pt ambulatory to BR then to bed. Belongings with husband at bedside. A&OX4. Vital signs stable. Pt oriented to unit and all questions answered from patient and family.Will continue to monitor.

## 2017-11-02 NOTE — Discharge Summary (Signed)
Physician Discharge Summary  Patient ID: Rebecca Bond MRN: [id number] DOB/AGE: May 14, 1946 71 y.o.  Admit date: 11/01/2017 Discharge date: 11/02/2017  Admission Diagnoses: Left L4-5 extraforaminal disc herniation with left leg pai    Discharge Diagnoses: same as admitting   Discharged Condition: good  Hospital Course: The patient was admitted on 11/01/2017 and taken to the operating room where the patient underwentLeft L4-5 extraforaminal decompression and microdiscectomy . The patient tolerated the procedure well and was taken to the recovery room and then to the floor in stable condition. The hospital course was routine. There were no complications. The wound remained clean dry and intact. Pt had appropriate back soreness. No complaints of new pain or new N/T/W. The patient remained afebrile with stable vital signs, and tolerated a regular diet. The patient continued to increase activities, and pain was well controlled with oral pain medications.   Consults: None  Significant Diagnostic Studies:  Results for orders placed or performed during the hospital encounter of 11/01/17  CBC  Result Value Ref Range   WBC 60.4 (HH) 4.0 - 10.5 K/uL   RBC 3.38 (L) 3.87 - 5.11 MIL/uL   Hemoglobin 10.4 (L) 12.0 - 15.0 g/dL   HCT 32.7 (L) 36.0 - 46.0 %   MCV 96.7 78.0 - 100.0 fL   MCH 30.8 26.0 - 34.0 pg   MCHC 31.8 30.0 - 36.0 g/dL   RDW 14.2 11.5 - 15.5 %   Platelets 97 (L) 150 - 400 K/uL    Chest 2 View  Result Date: 10/31/2017 CLINICAL DATA:  Preop lumbar surgery. EXAM: CHEST  2 VIEW COMPARISON:  12/28/2010 FINDINGS: Cardiomegaly. Right infrahilar airspace opacity, new since prior study. No confluent opacity on the left. No effusions. Heart is normal size. No acute bony abnormality. IMPRESSION: Right infrahilar airspace opacity concerning for pneumonia. Followup PA and lateral chest X-ray is recommended in 3-4 weeks following trial of antibiotic therapy to ensure resolution and exclude  underlying malignancy. Electronically Signed   By: Rolm Baptise M.D.   On: 10/31/2017 08:23   Dg Lumbar Spine 2-3 Views  Result Date: 11/01/2017 CLINICAL DATA:  L4-5 extraforaminal microdiscectomy on the left. EXAM: LUMBAR SPINE - 2-3 VIEW COMPARISON:  MRI 04/05/2017 FINDINGS: 3 lateral intraprocedural images show sequential stages of L4-5 localization and surgery. L1-2, L2-3, L3-4 degenerative disc narrowing with vacuum phenomenon. No acute osseous finding. IMPRESSION: Radiography for intraoperative localization. Electronically Signed   By: Monte Fantasia M.D.   On: 11/01/2017 19:10    Antibiotics:  Anti-infectives (From admission, onward)   Start     Dose/Rate Route Frequency Ordered Stop   11/02/17 0600  ceFAZolin (ANCEF) IVPB 2g/100 mL premix     2 g 200 mL/hr over 30 Minutes Intravenous On call to O.R. 11/01/17 1230 11/01/17 1643   11/01/17 2015  ceFAZolin (ANCEF) IVPB 2g/100 mL premix     2 g 200 mL/hr over 30 Minutes Intravenous Every 8 hours 11/01/17 2014 11/02/17 0450   11/01/17 1728  bacitracin 50,000 Units in sodium chloride irrigation 0.9 % 500 mL irrigation  Status:  Discontinued       As needed 11/01/17 1728 11/01/17 1811      Discharge Exam: Blood pressure (!) 152/63, pulse 62, temperature 97.6 F (36.4 C), temperature source Oral, resp. rate 18, height 5\' 3"  (1.6 m), weight 97.5 kg (215 lb), SpO2 95 %. Neurologic: Grossly normal Ambulating and voiding well  Discharge Medications:   Allergies as of 11/02/2017      Reactions  Hyzaar [losartan Potassium-hctz] Other (See Comments)   "messed up my sodium counts" swells up lips.   Sudafed [pseudoephedrine Hcl] Palpitations   Levaquin [levofloxacin In D5w] Diarrhea, Nausea Only   Biaxin [clarithromycin]    GI Upset   Celexa [citalopram]    Flexeril [cyclobenzaprine]    "zombie-like" feeling   Fosamax [alendronate Sodium]    GI upset   Iohexol Hives    Code: HIVES, Desc: pt states she broke out in hive 20 yrs ago  from IV contrast.     Losartan    Angioedema   Meloxicam    GI upset   Norvasc [amlodipine] Swelling   Pseudoephedrine    Palpitations   Zoloft [sertraline Hcl] Other (See Comments)   Has no emotions at all       Medication List    TAKE these medications   ALPRAZolam 1 MG tablet Commonly known as:  XANAX TAKE 1/2 TO 1 TABLET BY MOUTH THREE TIMES A DAY AS NEEDED What changed:  See the new instructions.   bisoprolol-hydrochlorothiazide 10-6.25 MG tablet Commonly known as:  ZIAC TAKE ONE TABLET BY MOUTH DAILY   D 2000 2000 units Tabs Generic drug:  Cholecalciferol Take 6,000-8,000 Units daily by mouth.   dicyclomine 10 MG capsule Commonly known as:  BENTYL Take 10 mg 3 (three) times daily as needed by mouth for spasms.   escitalopram 10 MG tablet Commonly known as:  LEXAPRO TAKE ONE TABLET BY MOUTH DAILY FOR MOOD   ezetimibe 10 MG tablet Commonly known as:  ZETIA TAKE ONE TABLET BY MOUTH DAILY   fexofenadine 180 MG tablet Commonly known as:  ALLEGRA Take 1 tablet (180 mg total) by mouth daily. Take PRN for allergies.   furosemide 40 MG tablet Commonly known as:  LASIX Take 20 mg daily as needed by mouth for fluid or edema.   HYDROcodone-acetaminophen 7.5-325 MG tablet Commonly known as:  NORCO Take 1 tablet by mouth every 6 (six) hours.   methocarbamol 500 MG tablet Commonly known as:  ROBAXIN Take 1 tablet (500 mg total) by mouth every 6 (six) hours as needed for muscle spasms.   pantoprazole 40 MG tablet Commonly known as:  PROTONIX TAKE ONE TABLET BY MOUTH EVERY MORNING What changed:    how much to take  how to take this  when to take this   PROBIOTIC PO Take 1 tablet daily by mouth.   SYSTANE OP Apply 2 drops daily to eye.   triamcinolone 55 MCG/ACT Aero nasal inhaler Commonly known as:  NASACORT Place 2 sprays daily as needed into the nose.   vitamin C 1000 MG tablet Take 1,000 mg daily by mouth.       Disposition: home   Final  Dx: same as admitting  Discharge Instructions     Remove dressing in 72 hours   Complete by:  As directed    Call MD for:  difficulty breathing, headache or visual disturbances   Complete by:  As directed    Call MD for:  extreme fatigue   Complete by:  As directed    Call MD for:  hives   Complete by:  As directed    Call MD for:  persistant dizziness or light-headedness   Complete by:  As directed    Call MD for:  persistant nausea and vomiting   Complete by:  As directed    Call MD for:  redness, tenderness, or signs of infection (pain, swelling, redness, odor or green/yellow discharge  around incision site)   Complete by:  As directed    Call MD for:  severe uncontrolled pain   Complete by:  As directed    Call MD for:  temperature >100.4   Complete by:  As directed    Diet - low sodium heart healthy   Complete by:  As directed    Driving Restrictions   Complete by:  As directed    No driving   Increase activity slowly   Complete by:  As directed    Lifting restrictions   Complete by:  As directed    Nothing heavier than 8 lbs         Signed: Ocie Cornfield Antonetta Clanton 11/02/2017, 10:05 AM

## 2017-11-08 ENCOUNTER — Other Ambulatory Visit (HOSPITAL_BASED_OUTPATIENT_CLINIC_OR_DEPARTMENT_OTHER): Payer: 59

## 2017-11-08 ENCOUNTER — Telehealth: Payer: Self-pay | Admitting: Hematology

## 2017-11-08 ENCOUNTER — Ambulatory Visit (HOSPITAL_BASED_OUTPATIENT_CLINIC_OR_DEPARTMENT_OTHER): Payer: 59 | Admitting: Hematology

## 2017-11-08 ENCOUNTER — Encounter: Payer: Self-pay | Admitting: Hematology

## 2017-11-08 VITALS — BP 156/52 | HR 55 | Temp 97.8°F | Resp 16 | Ht 63.0 in | Wt 207.4 lb

## 2017-11-08 DIAGNOSIS — C9110 Chronic lymphocytic leukemia of B-cell type not having achieved remission: Secondary | ICD-10-CM

## 2017-11-08 DIAGNOSIS — D801 Nonfamilial hypogammaglobulinemia: Secondary | ICD-10-CM

## 2017-11-08 DIAGNOSIS — D696 Thrombocytopenia, unspecified: Secondary | ICD-10-CM | POA: Diagnosis not present

## 2017-11-08 LAB — CBC WITH DIFFERENTIAL/PLATELET
BASO%: 0.2 % (ref 0.0–2.0)
BASOS ABS: 0.2 10*3/uL — AB (ref 0.0–0.1)
EOS ABS: 0.6 10*3/uL — AB (ref 0.0–0.5)
EOS%: 0.6 % (ref 0.0–7.0)
HCT: 34.8 % (ref 34.8–46.6)
HEMOGLOBIN: 11.2 g/dL — AB (ref 11.6–15.9)
LYMPH%: 89 % — ABNORMAL HIGH (ref 14.0–49.7)
MCH: 30.9 pg (ref 25.1–34.0)
MCHC: 32.1 g/dL (ref 31.5–36.0)
MCV: 96.4 fL (ref 79.5–101.0)
MONO#: 1.5 10*3/uL — ABNORMAL HIGH (ref 0.1–0.9)
MONO%: 1.5 % (ref 0.0–14.0)
NEUT#: 8.5 10*3/uL — ABNORMAL HIGH (ref 1.5–6.5)
NEUT%: 8.7 % — ABNORMAL LOW (ref 38.4–76.8)
Platelets: 140 10*3/uL — ABNORMAL LOW (ref 145–400)
RBC: 3.61 10*6/uL — ABNORMAL LOW (ref 3.70–5.45)
RDW: 14.8 % — AB (ref 11.2–14.5)
WBC: 97.7 10*3/uL (ref 3.9–10.3)
lymph#: 87 10*3/uL — ABNORMAL HIGH (ref 0.9–3.3)

## 2017-11-08 LAB — COMPREHENSIVE METABOLIC PANEL
ALT: 23 U/L (ref 0–55)
AST: 24 U/L (ref 5–34)
Albumin: 4.2 g/dL (ref 3.5–5.0)
Alkaline Phosphatase: 62 U/L (ref 40–150)
Anion Gap: 12 mEq/L — ABNORMAL HIGH (ref 3–11)
BUN: 14 mg/dL (ref 7.0–26.0)
CHLORIDE: 99 meq/L (ref 98–109)
CO2: 23 meq/L (ref 22–29)
CREATININE: 0.9 mg/dL (ref 0.6–1.1)
Calcium: 9.7 mg/dL (ref 8.4–10.4)
EGFR: >60 mL/min/{1.73_m2} (ref >60)
Glucose: 96 mg/dl (ref 70–140)
Potassium: 4.1 mEq/L (ref 3.5–5.1)
SODIUM: 134 meq/L — AB (ref 136–145)
Total Bilirubin: 1.05 mg/dL (ref 0.20–1.20)
Total Protein: 6.9 g/dL (ref 6.4–8.3)

## 2017-11-08 LAB — TECHNOLOGIST REVIEW

## 2017-11-08 LAB — LACTATE DEHYDROGENASE: LDH: 249 U/L — AB (ref 125–245)

## 2017-11-08 NOTE — Telephone Encounter (Signed)
Scheduled appt per 12/5 los - Gave patient AVS and calender per los.   

## 2017-11-08 NOTE — Progress Notes (Signed)
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 (patient reports -planned for August 2017) 13q deletion does pre-dispose her to recurrent SCC  Current Treatment: observation  Previous treatment: IVIG for several months last winter to reduce recurrent respiratory infections (Patient notes that this helped) Has not required definitive treatment for CLL at this time and has been reluctant to consider treatment recently.  INTERVAL HISTORY:  Patient is here for her scheduled f/u for CLL. She also had a lumbar laminectomy on 11/01/2017. WBC is up from 60.4 to 97.7 as of today 11/08/2017. Platelets have also improved to 140k from 97k. She is accompanied today by her husband. She has a f/u appointment next week s/p her laminectomy. She reports some pain to the surgery site but otherwise she has been feeling much better. She had some concerns of possible pneumonia on CXR and notes that she received antibiotics.  On ROS, pt reports some pain due to her laminectomy and denies fever, chills, night sweats, overt bleeding and any other accompanying symptoms.  REVIEW OF SYSTEMS:    10 Point review of systems of done and is negative except as noted above.  . Past Medical History:  Diagnosis Date  . Anemia   . Anxiety   . Arthritis   . CLL (chronic lymphocytic leukemia) ([place])   . Depression   . GERD (gastroesophageal reflux disease)   . Headache    "sinus headaches"  . History of kidney stones   . Hypertension   . OSA (obstructive sleep apnea)   . Pneumonia   . Prediabetes   . Recurrent sinus infections 08/02/2012    . Past Surgical History:  Procedure Laterality Date  . ABDOMINAL HYSTERECTOMY  1980's   "endometrosis"  .  APPENDECTOMY    . BREAST SURGERY    . CATARACT EXTRACTION, BILATERAL    . CHOLECYSTECTOMY  1985  . FUNCTIONAL ENDOSCOPIC SINUS SURGERY  1990's   "cause I kept having sinus infections"  . immunoglobulin treatment  2017  . LAPAROSCOPIC APPENDECTOMY N/A 06/04/2014   Procedure: APPENDECTOMY LAPAROSCOPIC;  Surgeon: Zenovia Jarred, MD;  Location: [facility];  Service: General;  Laterality: N/A;  . LUMBAR LAMINECTOMY/DECOMPRESSION MICRODISCECTOMY Left 11/01/2017   Procedure: Left Lumbar Four-Five Extraforaminal Microdiscectomy;  Surgeon: Eustace Moore, MD;  Location: [place];  Service: Neurosurgery;  Laterality: Left;  . LYMPH NODE BIOPSY     "determined I had CLL"  . SHOULDER ARTHROSCOPY Right   . TEMPOROMANDIBULAR JOINT ARTHROPLASTY  1980's  . TONSILLECTOMY AND ADENOIDECTOMY  1950's    . Social History   Tobacco Use  . Smoking status: Former Smoker    Packs/day: 0.75    Years: 4.00    Pack years: 3.00    Types: Cigarettes    Last attempt to quit: 12/05/1968    Years since quitting: 48.9  . Smokeless tobacco: Never Used  Substance Use Topics  . Alcohol use: Yes    Alcohol/week: 4.2 oz    Types: 7 Glasses of wine per week  . Drug use: No    ALLERGIES:  is allergic to hyzaar [losartan potassium-hctz]; sudafed [pseudoephedrine hcl]; levaquin [levofloxacin in d5w]; biaxin [clarithromycin]; celexa [citalopram]; flexeril [cyclobenzaprine]; fosamax [alendronate sodium]; iohexol; losartan; meloxicam;  norvasc [amlodipine]; pseudoephedrine; and zoloft [sertraline hcl].  MEDICATIONS:  Current Outpatient Medications  Medication Sig Dispense Refill  . ALPRAZolam (XANAX) 1 MG tablet TAKE 1/2 TO 1 TABLET BY MOUTH THREE TIMES A DAY AS NEEDED (Patient taking differently: TAKE 1/2 TABLET BY MOUTH THREE TIMES A DAY AS NEEDED FOR ANXIETY) 90 tablet 0  . Ascorbic Acid (VITAMIN C) 1000 MG tablet Take 1,000 mg daily by mouth.    . bisoprolol-hydrochlorothiazide (ZIAC) 10-6.25 MG tablet TAKE ONE TABLET BY  MOUTH DAILY 90 tablet 1  . Cholecalciferol (D 2000) 2000 units TABS Take 6,000-8,000 Units daily by mouth.    . dicyclomine (BENTYL) 10 MG capsule Take 10 mg 3 (three) times daily as needed by mouth for spasms.    [name] escitalopram (LEXAPRO) 10 MG tablet TAKE ONE TABLET BY MOUTH DAILY FOR MOOD 90 tablet 1  . ezetimibe (ZETIA) 10 MG tablet TAKE ONE TABLET BY MOUTH DAILY 90 tablet 1  . fexofenadine (ALLEGRA) 180 MG tablet Take 1 tablet (180 mg total) by mouth daily. Take PRN for allergies. 30 tablet 6  . furosemide (LASIX) 40 MG tablet Take 20 mg daily as needed by mouth for fluid or edema.     [name] HYDROcodone-acetaminophen (NORCO) 7.5-325 MG tablet Take 1 tablet by mouth every 6 (six) hours. 30 tablet 0  . methocarbamol (ROBAXIN) 500 MG tablet Take 1 tablet (500 mg total) by mouth every 6 (six) hours as needed for muscle spasms. 30 tablet 0  . pantoprazole (PROTONIX) 40 MG tablet TAKE ONE TABLET BY MOUTH EVERY MORNING (Patient taking differently: TAKE ONE TABLET BY MOUTH DAILY AS NEEDED FOR ACID REFLUX) 30 tablet 1  . Polyethyl Glycol-Propyl Glycol (SYSTANE OP) Apply 2 drops daily to eye.    . Probiotic Product (PROBIOTIC PO) Take 1 tablet daily by mouth.    . triamcinolone (NASACORT) 55 MCG/ACT AERO nasal inhaler Place 2 sprays daily as needed into the nose.     No current facility-administered medications for this visit.     PHYSICAL EXAMINATION: ECOG PERFORMANCE STATUS: 1 - Symptomatic but completely ambulatory  . Vitals:   11/08/17 1457  BP: (!) 156/52  Pulse: (!) 55  Resp: 16  Temp: 97.8 F (36.6 C)  SpO2: 100%    Filed Weights   11/08/17 1457  Weight: 207 lb 6.4 oz (94.1 kg)   .Body mass index is 36.74 kg/m.  GENERAL:alert, in no acute distress and comfortable SKIN: no acute rashes , healing nose surgical wound. EYES: normal, conjunctiva are pink and non-injected, sclera clear OROPHARYNX:no exudate, no erythema and lips, buccal mucosa, and tongue normal  NECK: supple, no  JVD, thyroid normal size, non-tender, without nodularity LYMPH:  no palpable lymphadenopathy in the cervical, axillary or inguinal LUNGS: clear to auscultation with normal respiratory effort HEART: regular rate & rhythm,  no murmurs and no lower extremity edema ABDOMEN: abdomen soft, non-tender, normoactive bowel sounds , no palpable hepatosplenomegaly Musculoskeletal: no cyanosis of digits and no clubbing  PSYCH: alert & oriented x 3 with fluent speech NEURO: no focal motor/sensory deficits  LABORATORY DATA:   .[name] CBC Latest Ref Rng & Units 11/08/2017 11/01/2017 10/30/2017  WBC 3.9 - 10.3 10e3/uL 97.7(HH) 60.4(HH) 75.1(HH)  Hemoglobin 11.6 - 15.9 g/dL 11.2(L) 10.4(L) 10.9(L)  Hematocrit 34.8 - 46.6 % 34.8 32.7(L) 34.2(L)  Platelets 145 - 400 10e3/uL 140(L) 97(L) 82(L)   . CBC    Component Value Date/Time   WBC 97.7 (HH) 11/08/2017 1418   WBC 60.4 (HH) 11/01/2017 1440  RBC 3.61 (L) 11/08/2017 1418   RBC 3.38 (L) 11/01/2017 1440   HGB 11.2 (L) 11/08/2017 1418   HCT 34.8 11/08/2017 1418   PLT 140 (L) 11/08/2017 1418   MCV 96.4 11/08/2017 1418   MCH 30.9 11/08/2017 1418   MCH 30.8 11/01/2017 1440   MCHC 32.1 11/08/2017 1418   MCHC 31.8 11/01/2017 1440   RDW 14.8 (H) 11/08/2017 1418   LYMPHSABS 87.0 (H) 11/08/2017 1418   MONOABS 1.5 (H) 11/08/2017 1418   EOSABS 0.6 (H) 11/08/2017 1418   BASOSABS 0.2 (H) 11/08/2017 1418      CMP Latest Ref Rng & Units 11/08/2017 10/30/2017 08/29/2017  Glucose 70 - 140 mg/dl 96 96 92  BUN 7.0 - 26.0 mg/dL 14.0 17 19  Creatinine 0.6 - 1.1 mg/dL 0.9 0.81 0.81  Sodium 136 - 145 mEq/L 134(L) 133(L) 133(L)  Potassium 3.5 - 5.1 mEq/L 4.1 4.8 3.7  Chloride 101 - 111 mmol/L - 99(L) 98  CO2 22 - 29 mEq/L 23 27 22   Calcium 8.4 - 10.4 mg/dL 9.7 9.6 9.3  Total Protein 6.4 - 8.3 g/dL 6.9 - 6.2  Total Bilirubin 0.20 - 1.20 mg/dL 1.05 - 0.7  Alkaline Phos 40 - 150 U/L 62 - -  AST 5 - 34 U/L 24 - 26  ALT 0 - 55 U/L 23 - 16      RADIOGRAPHIC  STUDIES: I have personally reviewed the radiological images as listed and agreed with the findings in the report.  CT ABDOMEN AND PELVIS WITHOUT CONTRAST   IMPRESSION: 1. No acute abnormalities within the abdomen or pelvis. 2. Mild splenomegaly and several prominent to mildly enlarged pelvic and inguinal lymph nodes. These findings are similar to the prior CT. 3. Small nonobstructing stone in the lower pole the right kidney. No ureteral stones or obstructive uropathy.   Electronically Signed   By: Lajean Manes M.D.   On: 11/14/2016 16:39    ASSESSMENT & PLAN:   71 yo caucasian female with   1.Rai Stage 2 previously - now Stage IV CLL with lymphocytosis, LNadenopathy and mild splenomegaly with anemia and thrombocytopenia.  2.  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 Only intervention thus far has been intermittent IVIG, clinically very helpful with decrease in respiratory infections.  Last imaging was CT AP 02-2015. --showed no evidence of Splenomegaly. Negative Hep B serology 2015  No overt new constitutional symptoms except mild unchanged fatigue.  2. Mild Anemia Hgb hemoglobin stable in the 10.5-11 range. hgb stable today at 11.2 3. Moderate thrombocytopenia PLT  PLT count today improved at 140k has recently been in the 80-90k range. Did receive platelets pre-operatively 4. 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 Has been taking good infection prevention precautions. Has had significant recurrent headaches with IVIG - now held. 5. 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 No issues with infection-   her 13q deletion places her at risk for recurrent SCC. 6. Recent  L spine disectomy. - still with some post-operative pain being managed by ortho. PLAN -we discussed her current lab findings. -labs stable from anemia and thrombocytopenia standpoint. -patient has previously and continues to desire  holding off CLL directed treatment. Has no acute new strong indication to initiate CLL treatment at this time. -continue close f/u with dermatologist for evaluation and management of non melanoma skin cancers that can be increased in patient with CLL with 13q deletion. -asymptomatic at this time regarding to CLL -infections precautions. -  rpt CXR with PCP to ensure resolution of pulmonary infiltrates  RTC with Dr. Irene Limbo in 2 months with labs  I spent 20 minutes counseling the patient face to face. The total time spent in the appointment was 25 minutes and more than 50% was on counseling and direct patient cares.    Sullivan Lone MD [place] AAHIVMS [facility] Hematology/Oncology Physician [place]  (Office):       [phone] (Work cell):  [phone] (Fax):           [phone]   This document serves as a record of services personally performed by Sullivan Lone, MD. It was created on his behalf by Alean Rinne, a trained medical scribe. The creation of this record is based on the scribe's personal observations and the provider's statements to them.   .I have reviewed the above documentation for accuracy and completeness, and I agree with the above. Brunetta Genera MD MS

## 2017-11-08 NOTE — Patient Instructions (Signed)
Thank you for choosing [facility] to provide your oncology and hematology care.  To afford each patient quality time with our providers, please arrive 30 minutes before your scheduled appointment time.  If you arrive late for your appointment, you may be asked to reschedule.  We strive to give you quality time with our providers, and arriving late affects you and other patients whose appointments are after yours.  If you are a no show for multiple scheduled visits, you may be dismissed from the clinic at the providers discretion.   Again, thank you for choosing [facility], our hope is that these requests will decrease the amount of time that you wait before being seen by our physicians.  ______________________________________________________________________ Should you have questions after your visit to the [facility], please contact our office at [phone] between the hours of 8:30 and 4:30 p.m.    Voicemails left after 4:30p.m will not be returned until the following business day.   For prescription refill requests, please have your pharmacy contact us directly.  Please also try to allow 48 hours for prescription requests.   Please contact the scheduling department for questions regarding scheduling.  For scheduling of procedures such as PET scans, CT scans, MRI, Ultrasound, etc please contact central scheduling at [phone].   Resources For Cancer Patients and Caregivers:  American Cancer Society:  800-227-2345  Can help patients locate various types of support and financial assistance Cancer Care: 1-800-813-HOPE (4673) Provides financial assistance, online support groups, medication/co-pay assistance.   [place] DSS:  336-641-3447 Where to apply for food stamps, Medicaid, and utility assistance Medicare Rights Center: 800-333-4114 Helps people with Medicare understand their rights and benefits, navigate the Medicare system, and secure the  quality healthcare they deserve SCAT: 336-333-6589 [place] [employer]'s shared-ride transportation service for eligible riders who have a disability that prevents them from riding the fixed route bus.   For additional information on assistance programs please contact our social worker:   Grier Hock/Abigail Elmore:  [phone] 

## 2017-11-30 ENCOUNTER — Other Ambulatory Visit: Payer: Self-pay | Admitting: Physician Assistant

## 2017-12-04 ENCOUNTER — Ambulatory Visit: Payer: Self-pay | Admitting: Physician Assistant

## 2017-12-12 NOTE — Progress Notes (Deleted)
?   Medicare retired?

## 2017-12-13 ENCOUNTER — Ambulatory Visit: Payer: Self-pay | Admitting: Physician Assistant

## 2018-01-17 ENCOUNTER — Ambulatory Visit: Payer: Medicare Other | Admitting: Hematology

## 2018-01-17 ENCOUNTER — Encounter: Payer: Self-pay | Admitting: Adult Health

## 2018-01-17 ENCOUNTER — Other Ambulatory Visit: Payer: Medicare Other

## 2018-01-17 ENCOUNTER — Ambulatory Visit: Payer: 59 | Admitting: Adult Health

## 2018-01-17 VITALS — BP 140/80 | HR 61 | Temp 97.7°F | Ht 63.0 in | Wt 205.0 lb

## 2018-01-17 DIAGNOSIS — R110 Nausea: Secondary | ICD-10-CM | POA: Diagnosis not present

## 2018-01-17 DIAGNOSIS — R300 Dysuria: Secondary | ICD-10-CM | POA: Diagnosis not present

## 2018-01-17 MED ORDER — NITROFURANTOIN MONOHYD MACRO 100 MG PO CAPS: 100.0000 mg | ORAL_CAPSULE | Freq: Two times a day (BID) | ORAL | 0 refills | Status: AC

## 2018-01-17 MED ORDER — ONDANSETRON HCL 4 MG PO TABS: 4.0000 mg | ORAL_TABLET | Freq: Every day | ORAL | 1 refills | Status: DC | PRN

## 2018-01-17 MED ORDER — PHENAZOPYRIDINE HCL 200 MG PO TABS: 200.0000 mg | ORAL_TABLET | Freq: Three times a day (TID) | ORAL | 0 refills | Status: DC | PRN

## 2018-01-17 NOTE — Progress Notes (Signed)
Assessment and Plan:  Rebecca Bond was seen today for urinary tract infection, diarrhea and sinus problem.  Diagnoses and all orders for this visit:  Dysuria -     Urine Culture -unable to provide sufficient specimen for urinalysis -     phenazopyridine (PYRIDIUM) 200 MG tablet; Take 1 tablet (200 mg total) by mouth 3 (three) times daily as needed for pain. -     nitrofurantoin, macrocrystal-monohydrate, (MACROBID) 100 MG capsule; Take 1 capsule (100 mg total) by mouth 2 (two) times daily for 5 days.  Nausea Onset today + diarrhea -     ondansetron (ZOFRAN) 4 MG tablet; Take 1 tablet (4 mg total) by mouth daily as needed for nausea or vomiting. Call if symptoms not improving in a few days, or with new abdominal pain, fever/chills -   Viral nasosinusitis Suggested symptomatic OTC remedies. Nasal saline spray for congestion. Nasal steroids, allergy pill  Follow up as needed - can call if worsening and can call in prednisone, zpak if needed       Labs will be drawn tomorrow at cancer center - she will fax these results -     Cancel: CBC with Differential/Platelet -     Cancel: BASIC METABOLIC PANEL WITH GFR  Further disposition pending results of labs. Discussed med's effects and SE's.   Over 15 minutes of exam, counseling, chart review, and critical decision making was performed.   Future Appointments  Date Time Provider [place]  01/18/2018  2:30 PM [facility] 4 [facility] None  01/18/2018  3:00 PM Brunetta Genera, MD [facility] None  01/31/2018  3:45 PM Vicie Mutters, PA-C [facility] None  09/11/2018  3:00 PM Vicie Mutters, PA-C [facility] None    ------------------------------------------------------------------------------------------------------------------   HPI BP 140/80   Pulse 61   Temp 97.7 F (36.5 C)   Ht 5\' 3"  (1.6 m)   Wt 205 lb (93 kg)   SpO2 98%   BMI 36.31 kg/m   72 y.o.female presents for ongoing UTI symptoms since last week. She  reports nausea, burning with urination, lower abdominal pressure, urgency. She did have a single episode of incontinence which is not normal for her. She reports new onset of watery stools today - she has taken some imodium for this today. She reports she has been "chugging" gatoraide - She denies fever/chills, chest pain, dyspnea, dizziness, cramps, myalgias, rashes.   She endorses new nasal congestion and facial pressure ongoing since this weekend. She has been taking norco as needed and has not taken other analgesics. She is currently taking allegra daily. Has not rotated agent recently. She has nasocort at home but has not been using.   She is recently s/p L4-5 repair of ruptured disk by Dr. Sherley Bounds on 32/35 with complications - she was recently started on gabapentin for related pain, approximately 2 weeks ago. Cannot recall dose. Reports gabapentin is helping radicular pain.   She has dx of CLL followed closely at cancer center by Dr. Irene Limbo with follow up with labs scheduled tomorrow.   Past Medical History:  Diagnosis Date  . Anemia   . Anxiety   . Arthritis   . CLL (chronic lymphocytic leukemia) ([place])   . Depression   . GERD (gastroesophageal reflux disease)   . Headache    "sinus headaches"  . History of kidney stones   . Hypertension   . OSA (obstructive sleep apnea)   . Pneumonia   . Prediabetes   . Recurrent sinus infections 08/02/2012  Allergies  Allergen Reactions  . Hyzaar [Losartan Potassium-Hctz] Other (See Comments)    "messed up my sodium counts" swells up lips.  [name] [Pseudoephedrine Hcl] Palpitations  . Levaquin [Levofloxacin In D5w] Diarrhea and Nausea Only  . Biaxin [Clarithromycin]     GI Upset  . Celexa [Citalopram]   . Flexeril [Cyclobenzaprine]     "zombie-like" feeling  . Fosamax [Alendronate Sodium]     GI upset  . Iohexol Hives     Code: HIVES, Desc: pt states she broke out in hive 20 yrs ago from IV contrast.     . Losartan      Angioedema  . Meloxicam     GI upset  . Norvasc [Amlodipine] Swelling  . Pseudoephedrine     Palpitations  . Zoloft [Sertraline Hcl] Other (See Comments)    Has no emotions at all     Current Outpatient Medications on File Prior to Visit  Medication Sig  . ALPRAZolam (XANAX) 1 MG tablet TAKE 1/2 TO 1 TABLET BY MOUTH THREE TIMES A DAY AS NEEDED (Patient taking differently: TAKE 1/2 TABLET BY MOUTH THREE TIMES A DAY AS NEEDED FOR ANXIETY)  . Ascorbic Acid (VITAMIN C) 1000 MG tablet Take 1,000 mg daily by mouth.  . bisoprolol-hydrochlorothiazide (ZIAC) 10-6.25 MG tablet TAKE ONE TABLET BY MOUTH DAILY  . Cholecalciferol (D 2000) 2000 units TABS Take 6,000-8,000 Units daily by mouth.  . dicyclomine (BENTYL) 10 MG capsule Take 10 mg 3 (three) times daily as needed by mouth for spasms.  [name] Kitchen escitalopram (LEXAPRO) 10 MG tablet TAKE ONE TABLET BY MOUTH DAILY FOR MOOD  . ezetimibe (ZETIA) 10 MG tablet TAKE ONE TABLET BY MOUTH DAILY  . fexofenadine (ALLEGRA) 180 MG tablet Take 1 tablet (180 mg total) by mouth daily. Take PRN for allergies.  [name] Kitchen HYDROcodone-acetaminophen (NORCO) 7.5-325 MG tablet Take 1 tablet by mouth every 6 (six) hours.  . pantoprazole (PROTONIX) 40 MG tablet Take 1 tablet (40 mg total) by mouth every morning.  Vladimir Faster Glycol-Propyl Glycol (SYSTANE OP) Apply 2 drops daily to eye.  . Probiotic Product (PROBIOTIC PO) Take 1 tablet daily by mouth.  . triamcinolone (NASACORT) 55 MCG/ACT AERO nasal inhaler Place 2 sprays daily as needed into the nose.  . furosemide (LASIX) 40 MG tablet Take 20 mg daily as needed by mouth for fluid or edema.   . methocarbamol (ROBAXIN) 500 MG tablet Take 1 tablet (500 mg total) by mouth every 6 (six) hours as needed for muscle spasms. (Patient not taking: Reported on 01/17/2018)   No current facility-administered medications on file prior to visit.     ROS: all negative except above.   Physical Exam:  BP 140/80   Pulse 61   Temp 97.7 F (36.5  C)   Ht 5\' 3"  (1.6 m)   Wt 205 lb (93 kg)   SpO2 98%   BMI 36.31 kg/m   General Appearance: Well nourished, in no apparent distress. Eyes: PERRLA, EOMs, conjunctiva no swelling or erythema Sinuses: No Frontal/maxillary tenderness ENT/Mouth: Ext aud canals clear, TMs without erythema, bulging. No erythema, swelling, or exudate on post pharynx.  Tonsils not swollen or erythematous. Hearing normal.  Neck: Supple, thyroid normal.  Respiratory: Respiratory effort normal, BS equal bilaterally without rales, rhonchi, wheezing or stridor.  Cardio: RRR with no MRGs. Brisk peripheral pulses without edema.  Abdomen: Soft, + BS.  + suprapubic tenderness, no guarding, rebound, hernias, masses. Lymphatics: Non tender without lymphadenopathy.  Musculoskeletal: symmetric strength, antalgic gait.  Skin: Warm, dry without rashes, lesions, ecchymosis.  Psych: Awake and oriented X 3, normal affect, Insight and Judgment appropriate.     Izora Ribas, NP 11:57 AM [facility]

## 2018-01-17 NOTE — Patient Instructions (Signed)
Start on allergy medication (claritin), do nasocort daily, and saline irrigations  Can start mucinex for very thick mucus  Drink plenty of fluids  If you suddenly have severe fever/chills, weakness or other sudden symptoms please call or go to the ER    HOW TO TREAT VIRAL COUGH AND COLD SYMPTOMS:  -Symptoms usually last at least 1 week with the worst symptoms being around day 4.  - colds usually start with a sore throat and end with a cough, and the cough can take 2 weeks to get better.  -No antibiotics are needed for colds, flu, sore throats, cough, bronchitis UNLESS symptoms are longer than 7 days OR if you are getting better then get drastically worse.  -There are a lot of combination medications (Dayquil, Nyquil, Vicks 44, tyelnol cold and sinus, ETC). Please look at the ingredients on the back so that you are treating the correct symptoms and not doubling up on medications/ingredients.    Medicines you can use  Nasal congestion  Little Remedies saline spray (aerosol/mist)- can try this, it is in the kids section - pseudoephedrine (Sudafed)- behind the counter, do not use if you have high blood pressure, medicine that have -D in them.  - phenylephrine (Sudafed PE) -Dextormethorphan + chlorpheniramine (Coridcidin HBP)- okay if you have high blood pressure -Oxymetazoline (Afrin) nasal spray- LIMIT to 3 days -Saline nasal spray -Neti pot (used distilled or bottled water)  Ear pain/congestion  -pseudoephedrine (sudafed) - Nasonex/flonase nasal spray  Fever  -Acetaminophen (Tyelnol) -Ibuprofen (Advil, motrin, aleve)  Sore Throat  -Acetaminophen (Tyelnol) -Ibuprofen (Advil, motrin, aleve) -Drink a lot of water -Gargle with salt water - Rest your voice (don't talk) -Throat sprays -Cough drops  Body Aches  -Acetaminophen (Tyelnol) -Ibuprofen (Advil, motrin, aleve)  Headache  -Acetaminophen (Tyelnol) -Ibuprofen (Advil, motrin, aleve) - Exedrin, Exedrin  Migraine  Allergy symptoms (cough, sneeze, runny nose, itchy eyes) -Claritin or loratadine cheapest but likely the weakest  -Zyrtec or certizine at night because it can make you sleepy -The strongest is allegra or fexafinadine  Cheapest at [employer], [employer], [employer]  Cough  -Dextromethorphan (Delsym)- medicine that has DM in it -Guafenesin (Mucinex/Robitussin) - cough drops - drink lots of water  Chest Congestion  -Guafenesin (Mucinex/Robitussin)  Red Itchy Eyes  - Naphcon-A  Upset Stomach  - Bland diet (nothing spicy, greasy, fried, and high acid foods like tomatoes, oranges, berries) -OKAY- cereal, bread, soup, crackers, rice -Eat smaller more frequent meals -reduce caffeine, no alcohol -Loperamide (Imodium-AD) if diarrhea -Prevacid for heart burn  General health when sick  -Hydration -wash your hands frequently -keep surfaces clean -change pillow cases and sheets often -Get fresh air but do not exercise strenuously -Vitamin D, double up on it - Vitamin C -Zinc

## 2018-01-17 NOTE — Progress Notes (Signed)
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 (patient reports -planned for August 2017) 13q deletion does pre-dispose her to recurrent SCC  Current Treatment: observation  Previous treatment: IVIG for several months last winter to reduce recurrent respiratory infections (Patient notes that this helped) Has not required definitive treatment for CLL at this time and has been reluctant to consider treatment recently.  INTERVAL HISTORY:  Rebecca Bond is here for her scheduled f/u for CLL. The patient's last visit with [place] was on 11/08/17. The pt reports that she is doing well overall. She notes that the wound from her back surgery in December has just now healed, and that she has had some numbness. She notes that there was no infection related to the surgery, and took prednisone and antibiotics as well.  She is seeing Dr. Sherley Bounds regarding her back surgery. She reports that they do not believe she has a pinched nerve; she is taking gabapentin 2 times a day. She notes that sitting is more difficult for her at present but is really enjoying being able to walk around without pain. She notes that she is currently being treated for a UTI and is taking Pyridium and Zofran.  Lab results today (01/18/18) of CBC is as follows: all values are WNL except for WBC at 89.7k, RBC at 3.53, Hgb at 11.0, HCT at 34.4, Platelets at 93k, Lymph Abs at 82.0k, Monocytes Abs at 1.1k, and Basophils Abs at 0.2. LDH today is at 227. CMP shows all values WNL except for Sodium at 135.  Reticulocytes today show Retic Ct Pct at 2.8, RBC at 3.53, and Retic Count Abs at 98.8.   On review of systems, pt reports occasional diarrhea, and  denies any new symptoms.    REVIEW OF SYSTEMS:    .10 Point review of Systems was done is negative except as noted above.   . Past Medical History:  Diagnosis Date  . Anemia   . Anxiety   . Arthritis   . CLL (chronic lymphocytic leukemia) ([place])   . Depression   . GERD (gastroesophageal reflux disease)   . Headache    "sinus headaches"  . History of kidney stones   . Hypertension   . OSA (obstructive sleep apnea)   . Pneumonia   . Prediabetes   . Recurrent sinus infections 08/02/2012    . Past Surgical History:  Procedure Laterality Date  . ABDOMINAL HYSTERECTOMY  1980's   "endometrosis"  . APPENDECTOMY    . BREAST SURGERY    . CATARACT EXTRACTION, BILATERAL    . CHOLECYSTECTOMY  1985  . FUNCTIONAL ENDOSCOPIC SINUS SURGERY  1990's   "cause I kept having sinus infections"  . immunoglobulin treatment  2017  . LAPAROSCOPIC APPENDECTOMY N/A 06/04/2014   Procedure: APPENDECTOMY LAPAROSCOPIC;  Surgeon: Zenovia Jarred, MD;  Location: [facility];  Service: General;  Laterality: N/A;  . LUMBAR LAMINECTOMY/DECOMPRESSION MICRODISCECTOMY Left 11/01/2017   Procedure: Left Lumbar Four-Five Extraforaminal Microdiscectomy;  Surgeon: Eustace Moore, MD;  Location: [facility];  Service: Neurosurgery;  Laterality: Left;  . LYMPH NODE BIOPSY     "determined I had CLL"  . SHOULDER ARTHROSCOPY Right   .  TEMPOROMANDIBULAR JOINT ARTHROPLASTY  1980's  . TONSILLECTOMY AND ADENOIDECTOMY  1950's    . Social History   Tobacco Use  . Smoking status: Former Smoker    Packs/day: 0.75    Years: 4.00    Pack years: 3.00    Types: Cigarettes    Last attempt to quit: 12/05/1968    Years since quitting: 49.1  . Smokeless tobacco: Never Used  Substance Use Topics  . Alcohol use: Yes    Alcohol/week: 4.2 oz    Types: 7 Glasses of wine per week  . Drug use: No    ALLERGIES:  is allergic to hyzaar [losartan potassium-hctz]; sudafed [pseudoephedrine hcl]; levaquin [levofloxacin in d5w]; biaxin  [clarithromycin]; celexa [citalopram]; flexeril [cyclobenzaprine]; fosamax [alendronate sodium]; iohexol; losartan; meloxicam; norvasc [amlodipine]; pseudoephedrine; and zoloft [sertraline hcl].  MEDICATIONS:  Current Outpatient Medications  Medication Sig Dispense Refill  . Ascorbic Acid (VITAMIN C) 1000 MG tablet Take 1,000 mg daily by mouth.    . bisoprolol-hydrochlorothiazide (ZIAC) 10-6.25 MG tablet TAKE ONE TABLET BY MOUTH DAILY 90 tablet 1  . Cholecalciferol (D 2000) 2000 units TABS Take 6,000-8,000 Units daily by mouth.    . escitalopram (LEXAPRO) 10 MG tablet TAKE ONE TABLET BY MOUTH DAILY FOR MOOD 90 tablet 1  . ezetimibe (ZETIA) 10 MG tablet TAKE ONE TABLET BY MOUTH DAILY 90 tablet 1  . fexofenadine (ALLEGRA) 180 MG tablet Take 1 tablet (180 mg total) by mouth daily. Take PRN for allergies. 30 tablet 6  . gabapentin (NEURONTIN) 300 MG capsule Take 300 mg by mouth 2 (two) times daily.    [name] Kitchen HYDROcodone-acetaminophen (NORCO) 7.5-325 MG tablet Take 1 tablet by mouth every 6 (six) hours. 30 tablet 0  . nitrofurantoin, macrocrystal-monohydrate, (MACROBID) 100 MG capsule Take 1 capsule (100 mg total) by mouth 2 (two) times daily for 5 days. 10 capsule 0  . ondansetron (ZOFRAN) 4 MG tablet Take 1 tablet (4 mg total) by mouth daily as needed for nausea or vomiting. 30 tablet 1  . phenazopyridine (PYRIDIUM) 200 MG tablet Take 1 tablet (200 mg total) by mouth 3 (three) times daily as needed for pain. 20 tablet 0  . Polyethyl Glycol-Propyl Glycol (SYSTANE OP) Apply 2 drops daily to eye.    . Probiotic Product (PROBIOTIC PO) Take 1 tablet daily by mouth.    . triamcinolone (NASACORT) 55 MCG/ACT AERO nasal inhaler Place 2 sprays daily as needed into the nose.    [name] Kitchen ALPRAZolam (XANAX) 1 MG tablet TAKE 1/2 TO 1 TABLET BY MOUTH THREE TIMES A DAY AS NEEDED (Patient not taking: Reported on 01/18/2018) 90 tablet 0  . furosemide (LASIX) 40 MG tablet Take 20 mg daily as needed by mouth for fluid or edema.       . methocarbamol (ROBAXIN) 500 MG tablet Take 1 tablet (500 mg total) by mouth every 6 (six) hours as needed for muscle spasms. (Patient not taking: Reported on 01/17/2018) 30 tablet 0   No current facility-administered medications for this visit.     PHYSICAL EXAMINATION: ECOG PERFORMANCE STATUS: 1 - Symptomatic but completely ambulatory  . Vitals:   01/18/18 1459  BP: (!) 146/72  Pulse: 68  Resp: 18  Temp: 98.9 F (37.2 C)  SpO2: 95%    Filed Weights   01/18/18 1459  Weight: 209 lb 4.8 oz (94.9 kg)   .Body mass index is 37.08 kg/m. [name] Kitchen GENERAL:alert, in no acute distress and comfortable SKIN: no acute rashes, no significant lesions EYES: conjunctiva are pink and non-injected,  sclera anicteric OROPHARYNX: MMM, no exudates, no oropharyngeal erythema or ulceration NECK: supple, no JVD LYMPH:  no palpable lymphadenopathy in the cervical, axillary or inguinal regions LUNGS: clear to auscultation b/l with normal respiratory effort HEART: regular rate & rhythm ABDOMEN:  normoactive bowel sounds , non tender, not distended. Extremity: no pedal edema PSYCH: alert & oriented x 3 with fluent speech NEURO: no focal motor/sensory deficits  LABORATORY DATA:   .[name] Kitchen CBC Latest Ref Rng & Units 01/18/2018 11/08/2017 11/01/2017  WBC 3.9 - 10.3 K/uL 89.7(HH) 97.7(HH) 60.4(HH)  Hemoglobin 11.6 - 15.9 g/dL - 11.2(L) 10.4(L)  Hematocrit 34.8 - 46.6 % 34.4(L) 34.8 32.7(L)  Platelets 145 - 400 K/uL 93(L) 140(L) 97(L)  hgb 11 . CBC    Component Value Date/Time   WBC 89.7 (HH) 01/18/2018 1423   WBC 97.7 (HH) 11/08/2017 1418   WBC 60.4 (HH) 11/01/2017 1440   RBC 3.53 (L) 01/18/2018 1423   RBC 3.53 (L) 01/18/2018 1423   HGB 11.2 (L) 11/08/2017 1418   HCT 34.4 (L) 01/18/2018 1423   HCT 34.8 11/08/2017 1418   PLT 93 (L) 01/18/2018 1423   PLT 140 (L) 11/08/2017 1418   MCV 97.5 01/18/2018 1423   MCV 96.4 11/08/2017 1418   MCH 31.2 01/18/2018 1423   MCHC 32.0 01/18/2018 1423   RDW 14.4  01/18/2018 1423   RDW 14.8 (H) 11/08/2017 1418   LYMPHSABS 82.0 (H) 01/18/2018 1423   LYMPHSABS 87.0 (H) 11/08/2017 1418   MONOABS 1.1 (H) 01/18/2018 1423   MONOABS 1.5 (H) 11/08/2017 1418   EOSABS 0.2 01/18/2018 1423   EOSABS 0.6 (H) 11/08/2017 1418   BASOSABS 0.2 (H) 01/18/2018 1423   BASOSABS 0.2 (H) 11/08/2017 1418      CMP Latest Ref Rng & Units 01/18/2018 11/08/2017 10/30/2017  Glucose 70 - 140 mg/dL 94 96 96  BUN 7 - 26 mg/dL 8 14.0 17  Creatinine 0.60 - 1.10 mg/dL 0.83 0.9 0.81  Sodium 136 - 145 mmol/L 135(L) 134(L) 133(L)  Potassium 3.5 - 5.1 mmol/L 3.8 4.1 4.8  Chloride 98 - 109 mmol/L 100 - 99(L)  CO2 22 - 29 mmol/L 23 23 27   Calcium 8.4 - 10.4 mg/dL 8.9 9.7 9.6  Total Protein 6.4 - 8.3 g/dL 6.5 6.9 -  Total Bilirubin 0.2 - 1.2 mg/dL 0.5 1.05 -  Alkaline Phos 40 - 150 U/L 67 62 -  AST 5 - 34 U/L 25 24 -  ALT 0 - 55 U/L 17 23 -      RADIOGRAPHIC STUDIES: I have personally reviewed the radiological images as listed and agreed with the findings in the report.  CT ABDOMEN AND PELVIS WITHOUT CONTRAST   IMPRESSION: 1. No acute abnormalities within the abdomen or pelvis. 2. Mild splenomegaly and several prominent to mildly enlarged pelvic and inguinal lymph nodes. These findings are similar to the prior CT. 3. Small nonobstructing stone in the lower pole the right kidney. No ureteral stones or obstructive uropathy.   Electronically Signed   By: Lajean Manes M.D.   On: 11/14/2016 16:39    ASSESSMENT & PLAN:   72 y.o.  caucasian female with   1.Rai Stage 2 previously - now Stage IV CLL with lymphocytosis, LNadenopathy and mild splenomegaly with anemia and thrombocytopenia. -patient has previously and continues to desire holding off CLL directed treatment.  Plan -labs were reviewde with patient -she has no acute new strong indication to initiate CLL treatment at this time. -asymptomatic at this time regarding to CLL -  infections precautions. -rpt CXR  with PCP to ensure resolution of pulmonary infiltrates   2.  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 Has been taking good infection prevention precautions. CLL initially diagnosed 2009 with 13q deletion.. Only intervention thus far has been intermittent IVIG, clinically very helpful with decrease in respiratory infections.  Last imaging was CT AP 02-2015. --showed no evidence of Splenomegaly. Negative Hep B serology 2015  No overt new constitutional symptoms except mild unchanged fatigue. Has had significant recurrent headaches with IVIG - now held.  2. Mild Anemia Hgb hemoglobin stable in the 10.5-11 range. Hgb stable today at 11.0  3. Moderate thrombocytopenia PLT count at 93k has recently been in the 80-90k range. Did receive platelets pre-operatively   4. H/o  Recurrent cutaneous SCC s/p Mohs surgery for SCC of the nosehealed. No issues with infection-   her 13q deletion places her at risk for recurrent SCC. -continue close f/u with dermatologist for evaluation and management of non melanoma skin cancers that can be increased in patient with CLL with 13q deletion.  5. Recent  L spine disectomy. - still with some post-operative pain being managed by ortho.   RTC with Dr. Irene Limbo in 3 months with labs  . The total time spent in the appointment was 15 minutes and more than 50% was on counseling and direct patient cares.     Sullivan Lone MD [place] AAHIVMS [facility] Hematology/Oncology Physician [place]  (Office):       [phone] (Work cell):  [phone] (Fax):           [phone]   This document serves as a record of services personally performed by Sullivan Lone, MD. It was created on his behalf by Baldwin Jamaica, a trained medical scribe. The creation of this record is based on the scribe's personal observations and the provider's statements to them.   .I have reviewed the above documentation for accuracy and completeness, and I agree with the above. Brunetta Genera MD MS

## 2018-01-18 ENCOUNTER — Encounter: Payer: Self-pay | Admitting: Hematology

## 2018-01-18 ENCOUNTER — Telehealth: Payer: Self-pay | Admitting: Hematology

## 2018-01-18 ENCOUNTER — Inpatient Hospital Stay: Payer: 59 | Attending: Hematology

## 2018-01-18 ENCOUNTER — Inpatient Hospital Stay: Payer: 59 | Admitting: Hematology

## 2018-01-18 VITALS — BP 146/72 | HR 68 | Temp 98.9°F | Resp 18 | Ht 63.0 in | Wt 209.3 lb

## 2018-01-18 DIAGNOSIS — C9110 Chronic lymphocytic leukemia of B-cell type not having achieved remission: Secondary | ICD-10-CM | POA: Diagnosis not present

## 2018-01-18 DIAGNOSIS — D649 Anemia, unspecified: Secondary | ICD-10-CM

## 2018-01-18 DIAGNOSIS — R161 Splenomegaly, not elsewhere classified: Secondary | ICD-10-CM | POA: Diagnosis not present

## 2018-01-18 DIAGNOSIS — D801 Nonfamilial hypogammaglobulinemia: Secondary | ICD-10-CM | POA: Diagnosis not present

## 2018-01-18 DIAGNOSIS — G4733 Obstructive sleep apnea (adult) (pediatric): Secondary | ICD-10-CM

## 2018-01-18 DIAGNOSIS — Z87442 Personal history of urinary calculi: Secondary | ICD-10-CM | POA: Insufficient documentation

## 2018-01-18 DIAGNOSIS — D696 Thrombocytopenia, unspecified: Secondary | ICD-10-CM

## 2018-01-18 DIAGNOSIS — Z79899 Other long term (current) drug therapy: Secondary | ICD-10-CM | POA: Diagnosis not present

## 2018-01-18 DIAGNOSIS — K219 Gastro-esophageal reflux disease without esophagitis: Secondary | ICD-10-CM

## 2018-01-18 DIAGNOSIS — F418 Other specified anxiety disorders: Secondary | ICD-10-CM

## 2018-01-18 DIAGNOSIS — I10 Essential (primary) hypertension: Secondary | ICD-10-CM | POA: Insufficient documentation

## 2018-01-18 DIAGNOSIS — Z85828 Personal history of other malignant neoplasm of skin: Secondary | ICD-10-CM | POA: Insufficient documentation

## 2018-01-18 LAB — CBC WITH DIFFERENTIAL (CANCER CENTER ONLY)
BASOS PCT: 0 %
Basophils Absolute: 0.2 10*3/uL — ABNORMAL HIGH (ref 0.0–0.1)
Eosinophils Absolute: 0.2 10*3/uL (ref 0.0–0.5)
Eosinophils Relative: 0 %
HCT: 34.4 % — ABNORMAL LOW (ref 34.8–46.6)
Hemoglobin: 11 g/dL — ABNORMAL LOW (ref 11.6–15.9)
LYMPHS PCT: 92 %
Lymphs Abs: 82 10*3/uL — ABNORMAL HIGH (ref 0.9–3.3)
MCH: 31.2 pg (ref 25.1–34.0)
MCHC: 32 g/dL (ref 31.5–36.0)
MCV: 97.5 fL (ref 79.5–101.0)
MONOS PCT: 1 %
Monocytes Absolute: 1.1 10*3/uL — ABNORMAL HIGH (ref 0.1–0.9)
NEUTROS PCT: 7 %
Neutro Abs: 6.2 10*3/uL (ref 1.5–6.5)
PLATELETS: 93 10*3/uL — AB (ref 145–400)
RBC: 3.53 MIL/uL — AB (ref 3.70–5.45)
RDW: 14.4 % (ref 11.2–14.5)
WBC: 89.7 10*3/uL — AB (ref 3.9–10.3)

## 2018-01-18 LAB — COMPREHENSIVE METABOLIC PANEL
ALBUMIN: 4.1 g/dL (ref 3.5–5.0)
ALT: 17 U/L (ref 0–55)
ANION GAP: 12 — AB (ref 3–11)
AST: 25 U/L (ref 5–34)
Alkaline Phosphatase: 67 U/L (ref 40–150)
BILIRUBIN TOTAL: 0.5 mg/dL (ref 0.2–1.2)
BUN: 8 mg/dL (ref 7–26)
CHLORIDE: 100 mmol/L (ref 98–109)
CO2: 23 mmol/L (ref 22–29)
Calcium: 8.9 mg/dL (ref 8.4–10.4)
Creatinine, Ser: 0.83 mg/dL (ref 0.60–1.10)
GFR calc Af Amer: >60 mL/min (ref >60)
GFR calc non Af Amer: >60 mL/min (ref >60)
GLUCOSE: 94 mg/dL (ref 70–140)
POTASSIUM: 3.8 mmol/L (ref 3.5–5.1)
SODIUM: 135 mmol/L — AB (ref 136–145)
TOTAL PROTEIN: 6.5 g/dL (ref 6.4–8.3)

## 2018-01-18 LAB — URINE CULTURE
MICRO NUMBER: 90194520
RESULT: NO GROWTH
SPECIMEN QUALITY:: ADEQUATE

## 2018-01-18 LAB — RETICULOCYTES
RBC.: 3.53 MIL/uL — ABNORMAL LOW (ref 3.70–5.45)
RETIC CT PCT: 2.8 % — AB (ref 0.7–2.1)
Retic Count, Absolute: 98.8 10*3/uL — ABNORMAL HIGH (ref 33.7–90.7)

## 2018-01-18 LAB — LACTATE DEHYDROGENASE: LDH: 227 U/L (ref 125–245)

## 2018-01-18 NOTE — Telephone Encounter (Signed)
Scheduled appt per 2/14 los - Gave patient aVS and calender per los.

## 2018-01-23 NOTE — Progress Notes (Deleted)
Assessment and Plan:   Essential hypertension If any CP, SOB, HA, dizziness, etc go to ER, patient expressed understands -     CBC with Differential/Platelet -     BASIC METABOLIC PANEL WITH GFR -     Hepatic function panel -     TSH  Mixed hyperlipidemia -continue medications, check lipids, decrease fatty foods, increase activity.  -     Lipid panel  Vitamin D deficiency  CLL (chronic lymphocytic leukemia) ([place]) Continue follow up oncology  Medication management  Morbid Obesity with co morbidities - long discussion about weight loss, diet, and exercise  Generalized anxiety disorder Doing well, work has improved -     escitalopram (LEXAPRO) 20 MG tablet; Take 1 tablet (20 mg total) by mouth daily. -     Hemoglobin A1c  Continue diet and meds as discussed. Further disposition pending results of labs. OVER 30 minutes of exam, counseling, chart review, referral performed   HPI 72 y.o. female  presents for over due 3 month follow up - follows up with hypertension, hyperlipidemia, prediabetes, Vitamin D, and complicated history of CLL .    Her blood pressure has been controlled at home,    She had a normal stress test 2014, cath 2003, echo 2014 with normal EF. [name] Kitchen She has right going pain, across her back, chest pain, had normal CT pelvis without contrast, has had colonoscopy and EGD this past week with GI, pending BX, has had diarrhea.   She follows with Dr. Oneita Kras for back pain,  She had left L4-5 extraforaminal decompression and microdiscectomy in Nov with Dr. Ronnald Ramp.    She does not workout. She denies chest pain, shortness of breath, dizziness.  She is on cholesterol medication and denies myalgias. Her cholesterol is at goal. The cholesterol last visit was:   Lab Results  Component Value Date   CHOL 158 08/29/2017   HDL 43 (L) 08/29/2017   LDLCALC 108 (H) 05/31/2017   TRIG 169 (H) 08/29/2017   CHOLHDL 3.7 08/29/2017    She has been working on diet and exercise for  prediabetes, and denies paresthesia of the feet, polydipsia, polyuria and visual disturbances. Last A1C in the office was:  Lab Results  Component Value Date   HGBA1C 5.1 08/29/2017   Patient is on Vitamin D supplement.   Lab Results  Component Value Date   VD25OH 103 (H) 08/29/2017     BMI is There is no height or weight on file to calculate BMI., she is working on diet and exercise.+ OSA on CPAP.  Wt Readings from Last 3 Encounters:  01/18/18 209 lb 4.8 oz (94.9 kg)  01/17/18 205 lb (93 kg)  11/08/17 207 lb 6.4 oz (94.1 kg)   Patient was dx'd with CLL in Dec 2009 and been monitored by Dr Beryle Beams and more recently by Dr Marko Plume. She is receiving IVIG.  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 Lab Results  Component Value Date   WBC 89.7 (HH) 01/18/2018   HGB 11.2 (L) 11/08/2017   HCT 34.4 (L) 01/18/2018   MCV 97.5 01/18/2018   PLT 93 (L) 01/18/2018     Current Medications:  Current Outpatient Medications on File Prior to Visit  Medication Sig  . ALPRAZolam (XANAX) 1 MG tablet TAKE 1/2 TO 1 TABLET BY MOUTH THREE TIMES A DAY AS NEEDED (Patient not taking: Reported on 01/18/2018)  . Ascorbic Acid (VITAMIN C) 1000 MG tablet Take 1,000 mg daily by mouth.  . bisoprolol-hydrochlorothiazide (ZIAC) 10-6.25 MG  tablet TAKE ONE TABLET BY MOUTH DAILY  . Cholecalciferol (D 2000) 2000 units TABS Take 6,000-8,000 Units daily by mouth.  . escitalopram (LEXAPRO) 10 MG tablet TAKE ONE TABLET BY MOUTH DAILY FOR MOOD  . ezetimibe (ZETIA) 10 MG tablet TAKE ONE TABLET BY MOUTH DAILY  . fexofenadine (ALLEGRA) 180 MG tablet Take 1 tablet (180 mg total) by mouth daily. Take PRN for allergies.  . furosemide (LASIX) 40 MG tablet Take 20 mg daily as needed by mouth for fluid or edema.   . gabapentin (NEURONTIN) 300 MG capsule Take 300 mg by mouth 2 (two) times daily.  [name] Kitchen HYDROcodone-acetaminophen (NORCO) 7.5-325 MG tablet Take 1 tablet by mouth every 6 (six) hours.  . methocarbamol (ROBAXIN) 500 MG tablet  Take 1 tablet (500 mg total) by mouth every 6 (six) hours as needed for muscle spasms. (Patient not taking: Reported on 01/17/2018)  . ondansetron (ZOFRAN) 4 MG tablet Take 1 tablet (4 mg total) by mouth daily as needed for nausea or vomiting.  . phenazopyridine (PYRIDIUM) 200 MG tablet Take 1 tablet (200 mg total) by mouth 3 (three) times daily as needed for pain.  Vladimir Faster Glycol-Propyl Glycol (SYSTANE OP) Apply 2 drops daily to eye.  . Probiotic Product (PROBIOTIC PO) Take 1 tablet daily by mouth.  . triamcinolone (NASACORT) 55 MCG/ACT AERO nasal inhaler Place 2 sprays daily as needed into the nose.   No current facility-administered medications on file prior to visit.    Medical History:  Past Medical History:  Diagnosis Date  . Anemia   . Anxiety   . Arthritis   . CLL (chronic lymphocytic leukemia) ([place])   . Depression   . GERD (gastroesophageal reflux disease)   . Headache    "sinus headaches"  . History of kidney stones   . Hypertension   . OSA (obstructive sleep apnea)   . Pneumonia   . Prediabetes   . Recurrent sinus infections 08/02/2012   Allergies:  Allergies  Allergen Reactions  . Hyzaar [Losartan Potassium-Hctz] Other (See Comments)    "messed up my sodium counts" swells up lips.  [name] [Pseudoephedrine Hcl] Palpitations  . Levaquin [Levofloxacin In D5w] Diarrhea and Nausea Only  . Biaxin [Clarithromycin]     GI Upset  . Celexa [Citalopram]   . Flexeril [Cyclobenzaprine]     "zombie-like" feeling  . Fosamax [Alendronate Sodium]     GI upset  . Iohexol Hives     Code: HIVES, Desc: pt states she broke out in hive 20 yrs ago from IV contrast.     . Losartan     Angioedema  . Meloxicam     GI upset  . Norvasc [Amlodipine] Swelling  . Pseudoephedrine     Palpitations  . Zoloft [Sertraline Hcl] Other (See Comments)    Has no emotions at all      Review of Systems:  Review of Systems  Constitutional: Positive for malaise/fatigue. Negative for  chills, diaphoresis, fever and weight loss.  HENT: Negative for congestion, ear discharge, ear pain, hearing loss, nosebleeds, sore throat and tinnitus.        + TMJ  Eyes: Negative.  Negative for blurred vision and double vision.  Respiratory: Negative for cough, hemoptysis, sputum production, shortness of breath, wheezing and stridor.   Cardiovascular: Negative for chest pain, palpitations, orthopnea, claudication, leg swelling and PND.  Gastrointestinal: Negative for abdominal pain, blood in stool, constipation, diarrhea, heartburn, melena, nausea and vomiting.  Genitourinary: Negative for dysuria, flank pain, frequency,  hematuria and urgency.  Musculoskeletal: Positive for joint pain (left shoulder). Negative for back pain, falls, myalgias and neck pain.  Skin: Negative.   Neurological: Negative for dizziness, tingling, tremors, sensory change, speech change, focal weakness, seizures, loss of consciousness, weakness and headaches.  Endo/Heme/Allergies: Negative for environmental allergies and polydipsia. Does not bruise/bleed easily.  Psychiatric/Behavioral: Negative for depression, hallucinations, memory loss, substance abuse and suicidal ideas. The patient is nervous/anxious. The patient does not have insomnia.     Family history- Review and unchanged Social history- Review and unchanged Physical Exam: There were no vitals taken for this visit. Wt Readings from Last 3 Encounters:  01/18/18 209 lb 4.8 oz (94.9 kg)  01/17/18 205 lb (93 kg)  11/08/17 207 lb 6.4 oz (94.1 kg)   General Appearance: Well nourished, in no apparent distress, no diaphroesis Eyes: PERRLA, EOMs, conjunctiva no swelling or erythema Sinuses: + Frontal/maxillary tenderness ENT/Mouth: Ext aud canals clear, TMs without erythema, bulging. No erythema, swelling, or exudate on post pharynx.  Tonsils not swollen or erythematous. Hearing normal. + TMJ tenderness Neck: Supple, thyroid normal.  Respiratory: Respiratory  effort normal, BS equal bilaterally without rales, rhonchi, wheezing or stridor.  Cardio: RRR with no MRGs. Brisk peripheral pulses without edema.  Abdomen: Soft, + BS, obese, diffusely overly tender to palpation, no peritoneal signs, + guarding, without rebound, hernias, masses. Lymphatics: Non tender without lymphadenopathy.  Musculoskeletal: Full ROM, 5/5 strength, Normal gait, Strength is normal and symmetric in arms. Skin:  Warm, dry without rashes, lesions, ecchymosis.  Neuro: Cranial nerves intact. Normal muscle tone, no cerebellar symptoms. Psych: Awake and oriented X 3, normal affect, appears anxious, Insight and Judgment appropriate.    Vicie Mutters, PA-C 8:27 AM [facility]

## 2018-01-24 ENCOUNTER — Ambulatory Visit: Payer: Self-pay | Admitting: Physician Assistant

## 2018-01-25 ENCOUNTER — Other Ambulatory Visit: Payer: Self-pay | Admitting: Physician Assistant

## 2018-01-25 MED ORDER — PREDNISONE 20 MG PO TABS: ORAL_TABLET | ORAL | 0 refills | Status: DC

## 2018-01-27 ENCOUNTER — Other Ambulatory Visit: Payer: Self-pay | Admitting: Internal Medicine

## 2018-01-31 ENCOUNTER — Other Ambulatory Visit: Payer: Self-pay | Admitting: Internal Medicine

## 2018-01-31 ENCOUNTER — Ambulatory Visit: Payer: Self-pay | Admitting: Physician Assistant

## 2018-02-02 ENCOUNTER — Other Ambulatory Visit: Payer: Self-pay

## 2018-02-05 NOTE — Progress Notes (Signed)
Assessment and Plan:   Essential hypertension If any CP, SOB, HA, dizziness, etc go to ER, patient expressed understands -     CBC with Differential/Platelet -     BASIC METABOLIC PANEL WITH GFR -     Hepatic function panel -     TSH  Mixed hyperlipidemia -continue medications, check lipids, decrease fatty foods, increase activity.  -     Lipid panel  CLL (chronic lymphocytic leukemia) ([place]) Continue follow up oncology  Morbid Obesity with co morbidities - long discussion about weight loss, diet, and exercise  Dyspnea, unspecified type -     Iron,Total/Total Iron Binding Cap -     Vitamin B12 -     EKG 12-Lead- WNL -     DG Chest 2 View; Future- had abnromal CXR 2018- will repeat- with immunodef- will start on doxycycline for possible infection -     Brain natriuretic peptide- some swelling- will check with CXR -     D-dimer, quantitative (not at [facility])- low risk but with CLL, recent surgery, and decreased activity will check DDimer --     omeprazole (PRILOSEC) 40 MG capsule; Take 1 capsule (40 mg total) by mouth daily-  -     doxycycline (VIBRAMYCIN) 100 MG capsule; Take 1 capsule twice daily with food  Hypogammaglobulinemia (HCC) Continue IVIG infusion  Prediabetes -     Hemoglobin A1c  Screening, anemia, deficiency, iron -     Iron,Total/Total Iron Binding Cap -     Vitamin B12  Continue diet and meds as discussed. Further disposition pending results of labs. OVER 30 minutes of exam, counseling, chart review, referral performed   HPI 72 y.o. female  presents for over due 3 month follow up - follows up with hypertension, hyperlipidemia, prediabetes, Vitamin D, and complicated history of CLL.   Due to [name] and immunodef, she has chronic fatigue and states she does better if she can work [employer], [employer], Friday and feels better if she has rest Wednesday. She works as [employer], takes calls, sits all day but can be very stressful.   She is having SOB, having  pain/peurtic chest pain with breathing, x 2 weeks. Having right back pain, epigastric pain. Has "tickle" in her throat but non productive cough.  She had a normal stress test 2014, cath 2003, echo 2014 with normal EF. She states her CPAP is broken and she has not been using it.  Weight is stable, some mild swelling in legs bilateral legs that is unchanged. No PND + orthopenia sleeping on 2 pillows. Had back surgery in Nov, just started back to work. She has been less mobile.  Wt Readings from Last 3 Encounters:  02/07/18 209 lb (94.8 kg)  01/18/18 209 lb 4.8 oz (94.9 kg)  01/17/18 205 lb (93 kg)   CXR 10/2017 IMPRESSION: Right infrahilar airspace opacity concerning for pneumonia. Followup PA and lateral chest X-ray is recommended in 3-4 weeks following trial of antibiotic therapy to ensure resolution and exclude underlying malignancy.   Her blood pressure has been controlled at home, BP: 122/80    She follows with Dr. Oneita Kras for back pain,  She had left L4-5 extraforaminal decompression and microdiscectomy in Nov with Dr. Ronnald Ramp.    She does not workout. She denies chest pain, shortness of breath, dizziness.  She is on cholesterol medication and denies myalgias. Her cholesterol is at goal. The cholesterol last visit was:   Lab Results  Component Value Date   CHOL 158  08/29/2017   HDL 43 (L) 08/29/2017   LDLCALC 108 (H) 05/31/2017   TRIG 169 (H) 08/29/2017   CHOLHDL 3.7 08/29/2017    She has been working on diet and exercise for prediabetes, and denies paresthesia of the feet, polydipsia, polyuria and visual disturbances. Last A1C in the office was:  Lab Results  Component Value Date   HGBA1C 5.1 08/29/2017   Patient is on Vitamin D supplement.   Lab Results  Component Value Date   VD25OH 103 (H) 08/29/2017     BMI is Body mass index is 37.02 kg/m., she is working on diet and exercise.+ OSA on CPAP.  Wt Readings from Last 3 Encounters:  02/07/18 209 lb (94.8 kg)  01/18/18 209  lb 4.8 oz (94.9 kg)  01/17/18 205 lb (93 kg)   Patient was dx'd with CLL in Dec 2009 and been monitored by Dr Beryle Beams and more recently by Dr Marko Plume. She is receiving IVIG.  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 Lab Results  Component Value Date   WBC 89.7 (HH) 01/18/2018   HGB 11.2 (L) 11/08/2017   HCT 34.4 (L) 01/18/2018   MCV 97.5 01/18/2018   PLT 93 (L) 01/18/2018     Current Medications:  Current Outpatient Medications on File Prior to Visit  Medication Sig  . ALPRAZolam (XANAX) 1 MG tablet TAKE 1/2 TO 1 TABLET BY MOUTH THREE TIMES A DAY AS NEEDED  . Ascorbic Acid (VITAMIN C) 1000 MG tablet Take 1,500 mg by mouth daily.   . bisoprolol-hydrochlorothiazide (ZIAC) 10-6.25 MG tablet TAKE ONE TABLET BY MOUTH DAILY  . Cholecalciferol (D 2000) 2000 units TABS Take 10,000 Units by mouth daily.   . DiphenhydrAMINE HCl (BENADRYL ALLERGY PO) Take by mouth. Take 2 tablets daily  . escitalopram (LEXAPRO) 10 MG tablet TAKE ONE TABLET BY MOUTH DAILY FOR MOOD  . ezetimibe (ZETIA) 10 MG tablet TAKE ONE TABLET BY MOUTH DAILY  . fexofenadine (ALLEGRA) 180 MG tablet Take 1 tablet (180 mg total) by mouth daily. Take PRN for allergies.  [name] Kitchen HYDROcodone-acetaminophen (NORCO) 7.5-325 MG tablet Take 1 tablet by mouth every 6 (six) hours. (Patient taking differently: Take 1 tablet by mouth as needed. )  . Polyethyl Glycol-Propyl Glycol (SYSTANE OP) Apply 2 drops daily to eye.  . Probiotic Product (PROBIOTIC PO) Take 1 tablet daily by mouth.  . triamcinolone (NASACORT) 55 MCG/ACT AERO nasal inhaler Place 2 sprays daily as needed into the nose.  . furosemide (LASIX) 40 MG tablet Take 20 mg daily as needed by mouth for fluid or edema.   . gabapentin (NEURONTIN) 300 MG capsule Take 300 mg by mouth 2 (two) times daily.  . methocarbamol (ROBAXIN) 500 MG tablet Take 1 tablet (500 mg total) by mouth every 6 (six) hours as needed for muscle spasms. (Patient not taking: Reported on 02/07/2018)  . ondansetron  (ZOFRAN) 4 MG tablet Take 1 tablet (4 mg total) by mouth daily as needed for nausea or vomiting. (Patient not taking: Reported on 02/07/2018)  . phenazopyridine (PYRIDIUM) 200 MG tablet Take 1 tablet (200 mg total) by mouth 3 (three) times daily as needed for pain. (Patient not taking: Reported on 02/07/2018)  . predniSONE (DELTASONE) 20 MG tablet 2 tablets daily for 3 days, 1 tablet daily for 4 days. (Patient not taking: Reported on 02/07/2018)   No current facility-administered medications on file prior to visit.    Medical History:  Past Medical History:  Diagnosis Date  . Anemia   . Anxiety   .  Arthritis   . CLL (chronic lymphocytic leukemia) ([place])   . Depression   . GERD (gastroesophageal reflux disease)   . Headache    "sinus headaches"  . History of kidney stones   . Hypertension   . OSA (obstructive sleep apnea)   . Pneumonia   . Prediabetes   . Recurrent sinus infections 08/02/2012   Allergies:  Allergies  Allergen Reactions  . Hyzaar [Losartan Potassium-Hctz] Other (See Comments)    "messed up my sodium counts" swells up lips.  [name] [Pseudoephedrine Hcl] Palpitations  . Levaquin [Levofloxacin In D5w] Diarrhea and Nausea Only  . Biaxin [Clarithromycin]     GI Upset  . Celexa [Citalopram]   . Flexeril [Cyclobenzaprine]     "zombie-like" feeling  . Fosamax [Alendronate Sodium]     GI upset  . Iohexol Hives     Code: HIVES, Desc: pt states she broke out in hive 20 yrs ago from IV contrast.     . Losartan     Angioedema  . Meloxicam     GI upset  . Norvasc [Amlodipine] Swelling  . Pseudoephedrine     Palpitations  . Zoloft [Sertraline Hcl] Other (See Comments)    Has no emotions at all      Review of Systems:  Review of Systems  Constitutional: Positive for malaise/fatigue. Negative for chills, diaphoresis, fever and weight loss.  HENT: Positive for congestion and sinus pain. Negative for ear discharge, ear pain, hearing loss, nosebleeds, sore throat and  tinnitus.        + TMJ  Eyes: Negative.  Negative for blurred vision and double vision.  Respiratory: Positive for cough and shortness of breath. Negative for hemoptysis, sputum production, wheezing and stridor.   Cardiovascular: Positive for chest pain (pleuritc), orthopnea and leg swelling. Negative for palpitations, claudication and PND.  Gastrointestinal: Positive for abdominal pain, constipation, diarrhea and heartburn. Negative for blood in stool, melena, nausea and vomiting.  Genitourinary: Negative for dysuria, flank pain, frequency, hematuria and urgency.  Musculoskeletal: Positive for joint pain (left shoulder). Negative for back pain, falls, myalgias and neck pain.  Skin: Negative.   Neurological: Negative for dizziness, tingling, tremors, sensory change, speech change, focal weakness, seizures, loss of consciousness, weakness and headaches.  Endo/Heme/Allergies: Negative for environmental allergies and polydipsia. Does not bruise/bleed easily.  Psychiatric/Behavioral: Negative for depression, hallucinations, memory loss, substance abuse and suicidal ideas. The patient is nervous/anxious. The patient does not have insomnia.     Family history- Review and unchanged Social history- Review and unchanged Physical Exam: BP 122/80   Pulse 62   Temp (!) 97.3 F (36.3 C)   Ht 5\' 3"  (1.6 m)   Wt 209 lb (94.8 kg)   SpO2 98%   BMI 37.02 kg/m  Wt Readings from Last 3 Encounters:  02/07/18 209 lb (94.8 kg)  01/18/18 209 lb 4.8 oz (94.9 kg)  01/17/18 205 lb (93 kg)   General Appearance: Well nourished, in no apparent distress, no diaphroesis Eyes: PERRLA, EOMs, conjunctiva no swelling or erythema Sinuses: + Frontal/maxillary tenderness ENT/Mouth: Ext aud canals clear, TMs without erythema, bulging. No erythema, swelling, or exudate on post pharynx.  Tonsils not swollen or erythematous. Hearing normal. + TMJ tenderness Neck: Supple, thyroid normal.  Respiratory: Respiratory effort  normal, BS equal bilaterally without rales, rhonchi, wheezing or stridor.  Cardio: RRR with no MRGs. Brisk peripheral pulses without edema.  Abdomen: Soft, + BS, obese, diffusely overly tender to palpation, no peritoneal signs, + guarding, without  rebound, hernias, masses. Lymphatics: Non tender without lymphadenopathy.  Musculoskeletal: Full ROM, 5/5 strength, Normal gait, Strength is normal and symmetric in arms. Skin:  Warm, dry without rashes, lesions, ecchymosis.  Neuro: Cranial nerves intact. Normal muscle tone, no cerebellar symptoms. Psych: Awake and oriented X 3, normal affect, appears anxious, Insight and Judgment appropriate.    Vicie Mutters, PA-C 10:50 AM [facility]

## 2018-02-07 ENCOUNTER — Encounter: Payer: Self-pay | Admitting: Physician Assistant

## 2018-02-07 ENCOUNTER — Ambulatory Visit (HOSPITAL_COMMUNITY)
Admission: RE | Admit: 2018-02-07 | Discharge: 2018-02-07 | Disposition: A | Discharge status: Home / Self Care | Payer: 59 | Source: Ambulatory Visit | Attending: Physician Assistant | Admitting: Physician Assistant

## 2018-02-07 ENCOUNTER — Ambulatory Visit: Payer: 59 | Admitting: Physician Assistant

## 2018-02-07 VITALS — BP 122/80 | HR 62 | Temp 97.3°F | Ht 63.0 in | Wt 209.0 lb

## 2018-02-07 DIAGNOSIS — D801 Nonfamilial hypogammaglobulinemia: Secondary | ICD-10-CM | POA: Diagnosis not present

## 2018-02-07 DIAGNOSIS — R7303 Prediabetes: Secondary | ICD-10-CM | POA: Diagnosis not present

## 2018-02-07 DIAGNOSIS — C9110 Chronic lymphocytic leukemia of B-cell type not having achieved remission: Secondary | ICD-10-CM

## 2018-02-07 DIAGNOSIS — E782 Mixed hyperlipidemia: Secondary | ICD-10-CM

## 2018-02-07 DIAGNOSIS — Z79899 Other long term (current) drug therapy: Secondary | ICD-10-CM | POA: Diagnosis not present

## 2018-02-07 DIAGNOSIS — D696 Thrombocytopenia, unspecified: Secondary | ICD-10-CM

## 2018-02-07 DIAGNOSIS — R0600 Dyspnea, unspecified: Secondary | ICD-10-CM

## 2018-02-07 DIAGNOSIS — Z136 Encounter for screening for cardiovascular disorders: Secondary | ICD-10-CM | POA: Diagnosis not present

## 2018-02-07 DIAGNOSIS — Z130 Encounter for screening for diseases of the blood and blood-forming organs and certain disorders involving the immune mechanism: Secondary | ICD-10-CM

## 2018-02-07 DIAGNOSIS — C9190 Lymphoid leukemia, unspecified not having achieved remission: Secondary | ICD-10-CM

## 2018-02-07 DIAGNOSIS — I10 Essential (primary) hypertension: Secondary | ICD-10-CM | POA: Diagnosis not present

## 2018-02-07 DIAGNOSIS — G4733 Obstructive sleep apnea (adult) (pediatric): Secondary | ICD-10-CM

## 2018-02-07 MED ORDER — DOXYCYCLINE HYCLATE 100 MG PO CAPS: ORAL_CAPSULE | ORAL | 0 refills | Status: DC

## 2018-02-07 MED ORDER — OMEPRAZOLE 40 MG PO CPDR: 40.0000 mg | DELAYED_RELEASE_CAPSULE | Freq: Every day | ORAL | 1 refills | Status: DC

## 2018-02-07 NOTE — Patient Instructions (Addendum)
Get back on CPAP Can call Dr. Toy Cookey to get evaluated for dental sleep appliance. # [phone]  OR you can try Dr. Clearence Ped in [facility] # [phone]  We will send notes. Call and get price quote on both.    Will start on antibiotic since immunocompromised.  Will start on doxycycline  Get on stomach med x 2 weeks  Have better diet  Go to the ER if any chest pain, shortness of breath, nausea, dizziness, severe HA, changes vision/speech   Take omeprazole over the counter for 2 weeks, then go to zantac 150-300 mg OR pepcid 20 or 40mg  at night for 2 weeks, then you can stop or continue as needed.  Avoid alcohol, spicy foods, NSAIDS (aleve, ibuprofen) at this time. See foods below.   Food Choices for Gastroesophageal Reflux Disease When you have gastroesophageal reflux disease (GERD), the foods you eat and your eating habits are very important. Choosing the right foods can help ease the discomfort of GERD. WHAT GENERAL GUIDELINES DO I NEED TO FOLLOW?  Choose fruits, vegetables, whole grains, low-fat dairy products, and low-fat meat, fish, and poultry.  Limit fats such as oils, salad dressings, butter, nuts, and avocado.  Keep a food diary to identify foods that cause symptoms.  Avoid foods that cause reflux. These may be different for different people.  Eat frequent small meals instead of three large meals each day.  Eat your meals slowly, in a relaxed setting.  Limit fried foods.  Cook foods using methods other than frying.  Avoid drinking alcohol.  Avoid drinking large amounts of liquids with your meals.  Avoid bending over or lying down until 2-3 hours after eating. WHAT FOODS ARE NOT RECOMMENDED? The following are some foods and drinks that may worsen your symptoms: Vegetables Tomatoes. Tomato juice. Tomato and spaghetti sauce. Chili peppers. Onion and garlic. Horseradish. Fruits Oranges, grapefruit, and lemon (fruit and juice). Meats High-fat meats,  fish, and poultry. This includes hot dogs, ribs, ham, sausage, salami, and bacon. Dairy Whole milk and chocolate milk. Sour cream. Cream. Butter. Ice cream. Cream cheese.  Beverages Coffee and tea, with or without caffeine. Carbonated beverages or energy drinks. Condiments Hot sauce. Barbecue sauce.  Sweets/Desserts Chocolate and cocoa. Donuts. Peppermint and spearmint. Fats and Oils High-fat foods, including [place] fries and potato chips. Other Vinegar. Strong spices, such as black pepper, white pepper, red pepper, cayenne, curry powder, cloves, ginger, and chili powder.   Costochondritis Costochondritis is swelling and irritation (inflammation) of the tissue (cartilage) that connects your ribs to your breastbone (sternum). This causes pain in the front of your chest. Usually, the pain:  Starts gradually.  Is in more than one rib.  This condition usually goes away on its own over time. Follow these instructions at home:  Do not do anything that makes your pain worse.  If directed, put ice on the painful area: ? Put ice in a plastic bag. ? Place a towel between your skin and the bag. ? Leave the ice on for 20 minutes, 2-3 times a day.  If directed, put heat on the affected area as often as told by your doctor. Use the heat source that your doctor tells you to use, such as a moist heat pack or a heating pad. ? Place a towel between your skin and the heat source. ? Leave the heat on for 20-30 minutes. ? Take off the heat if your skin turns bright red. This is very important  if you cannot feel pain, heat, or cold. You may have a greater risk of getting burned.  Take over-the-counter and prescription medicines only as told by your doctor.  Return to your normal activities as told by your doctor. Ask your doctor what activities are safe for you.  Keep all follow-up visits as told by your doctor. This is important. Contact a doctor if:  You have chills or a fever.  Your pain  does not go away or it gets worse.  You have a cough that does not go away. Get help right away if:  You are short of breath. This information is not intended to replace advice given to you by your health care provider. Make sure you discuss any questions you have with your health care provider. Document Released: 05/09/2008 Document Revised: 06/10/2016 Document Reviewed: 03/16/2016 Elsevier Interactive Patient Education  [employer].

## 2018-02-08 LAB — CBC WITH DIFFERENTIAL/PLATELET
BASOS ABS: 0 {cells}/uL (ref 0–200)
Basophils Relative: 0 %
Eosinophils Absolute: 358 cells/uL (ref 15–500)
Eosinophils Relative: 0.3 %
HEMATOCRIT: 32.5 % — AB (ref 35.0–45.0)
HEMOGLOBIN: 10.3 g/dL — AB (ref 11.7–15.5)
LYMPHS ABS: 111903 {cells}/uL — AB (ref 850–3900)
MCH: 30 pg (ref 27.0–33.0)
MCHC: 31.7 g/dL — AB (ref 32.0–36.0)
MCV: 94.8 fL (ref 80.0–100.0)
MPV: 8.6 fL (ref 7.5–12.5)
Monocytes Relative: 1.3 %
NEUTROS PCT: 4.6 %
Neutro Abs: 5488 cells/uL (ref 1500–7800)
Platelets: 115 10*3/uL — ABNORMAL LOW (ref 140–400)
RBC: 3.43 10*6/uL — ABNORMAL LOW (ref 3.80–5.10)
RDW: 13.9 % (ref 11.0–15.0)
Total Lymphocyte: 93.8 %
WBC mixed population: 1551 cells/uL — ABNORMAL HIGH (ref 200–950)
WBC: 119.3 10*3/uL — ABNORMAL HIGH (ref 3.8–10.8)

## 2018-02-08 LAB — IRON, TOTAL/TOTAL IRON BINDING CAP
%SAT: 17 % (ref 11–50)
Iron: 66 ug/dL (ref 45–160)
TIBC: 396 ug/dL (ref 250–450)

## 2018-02-08 LAB — BASIC METABOLIC PANEL WITH GFR
BUN: 17 mg/dL (ref 7–25)
CO2: 25 mmol/L (ref 20–32)
CREATININE: 0.86 mg/dL (ref 0.60–0.93)
Calcium: 9.6 mg/dL (ref 8.6–10.4)
Chloride: 101 mmol/L (ref 98–110)
GFR, EST AFRICAN AMERICAN: 79 mL/min/{1.73_m2} (ref >=60)
GFR, EST NON AFRICAN AMERICAN: 68 mL/min/{1.73_m2} (ref >=60)
Glucose, Bld: 98 mg/dL (ref 65–99)
Potassium: 3.8 mmol/L (ref 3.5–5.3)
SODIUM: 138 mmol/L (ref 135–146)

## 2018-02-08 LAB — LIPID PANEL
CHOL/HDL RATIO: 3.4 (calc) (ref <5.0)
Cholesterol: 198 mg/dL (ref <200)
HDL: 59 mg/dL (ref >50)
LDL Cholesterol (Calc): 114 mg/dL (calc) — ABNORMAL HIGH
NON-HDL CHOLESTEROL (CALC): 139 mg/dL — AB (ref <130)
Triglycerides: 132 mg/dL (ref <150)

## 2018-02-08 LAB — HEPATIC FUNCTION PANEL
AG RATIO: 2.6 (calc) — AB (ref 1.0–2.5)
ALKALINE PHOSPHATASE (APISO): 59 U/L (ref 33–130)
ALT: 17 U/L (ref 6–29)
AST: 20 U/L (ref 10–35)
Albumin: 4.7 g/dL (ref 3.6–5.1)
BILIRUBIN INDIRECT: 0.5 mg/dL (ref 0.2–1.2)
BILIRUBIN TOTAL: 0.7 mg/dL (ref 0.2–1.2)
Bilirubin, Direct: 0.2 mg/dL (ref 0.0–0.2)
Globulin: 1.8 g/dL (calc) — ABNORMAL LOW (ref 1.9–3.7)
Total Protein: 6.5 g/dL (ref 6.1–8.1)

## 2018-02-08 LAB — BRAIN NATRIURETIC PEPTIDE: Brain Natriuretic Peptide: 120 pg/mL — ABNORMAL HIGH (ref <100)

## 2018-02-08 LAB — HEMOGLOBIN A1C
HEMOGLOBIN A1C: 5.4 %{Hb} (ref <5.7)
Mean Plasma Glucose: 108 (calc)
eAG (mmol/L): 6 (calc)

## 2018-02-08 LAB — D-DIMER, QUANTITATIVE: D-Dimer, Quant: 0.36 mcg/mL FEU (ref <0.50)

## 2018-02-08 LAB — MAGNESIUM: MAGNESIUM: 1.8 mg/dL (ref 1.5–2.5)

## 2018-02-08 LAB — VITAMIN B12: VITAMIN B 12: 510 pg/mL (ref 200–1100)

## 2018-02-08 LAB — TSH: TSH: 4.17 mIU/L (ref 0.40–4.50)

## 2018-02-09 ENCOUNTER — Telehealth: Payer: Self-pay

## 2018-02-09 ENCOUNTER — Other Ambulatory Visit: Payer: Self-pay | Admitting: Physician Assistant

## 2018-02-09 ENCOUNTER — Other Ambulatory Visit: Payer: Self-pay | Admitting: Internal Medicine

## 2018-02-09 MED ORDER — PREDNISONE 20 MG PO TABS: ORAL_TABLET | ORAL | 0 refills | Status: DC

## 2018-02-09 NOTE — Telephone Encounter (Signed)
Pt reports Rib pain & would like something sent in for this.   Per provider pt should go to ER if she has any worsen sxs or SOB, DIZZINESS. Predisone has been called into pharmacy.  Pt voiced understanding & hung up.

## 2018-02-12 ENCOUNTER — Other Ambulatory Visit: Payer: Self-pay

## 2018-02-12 ENCOUNTER — Telehealth: Payer: Self-pay

## 2018-02-12 ENCOUNTER — Telehealth: Payer: Self-pay | Admitting: Hematology

## 2018-02-12 DIAGNOSIS — C9110 Chronic lymphocytic leukemia of B-cell type not having achieved remission: Secondary | ICD-10-CM

## 2018-02-12 NOTE — Telephone Encounter (Signed)
Patient called to reschedule march appointment to 3/13

## 2018-02-12 NOTE — Telephone Encounter (Signed)
Dr. Irene Limbo requested to see pt in the next 1-2 weeks for f/u. Called pt to schedule lab and doctor visit. Appt created on 3/20 at 1415 for labs and 1440 for the doctor. Pt verbalized understanding. Lab orders placed per MD request.

## 2018-02-13 ENCOUNTER — Telehealth: Payer: Self-pay | Admitting: Cardiovascular Disease

## 2018-02-13 ENCOUNTER — Telehealth: Payer: Self-pay

## 2018-02-13 NOTE — Telephone Encounter (Signed)
New Message   Patient states that her chest, ribs and back hurts. But once she starts to move around the pain stops. She states that this has been occurring about two weeks. She went to her PCP and they did a chest xray and she does not have pneumonia. Please call to discuss.

## 2018-02-13 NOTE — Telephone Encounter (Signed)
Patient c/o that 2 weeks ago she started having pain in her ribs, chest and back that has awaken her up in the early morning hours. Patient said that when she has the pain that she also has sob. Patient confirmed that she is using her cpap machine nightly. Patient said that when she woke up on yesterday morning, she had pain rated 8 &12/06/08 in her ribs, chest and back. Patient said that she didn't take any medication for the pain but did take some deep breaths and the pain subsided. Patient stated, "the pain feels like someone has hit me in my ribs and it feels like I have ran a race." Patient is currently at work and has no active chest,back or rib pain. Patient said that she has been taking 2 extra strength tylenol at bedtime. Patient said that she notices that when she sleeps sitting up in a chair, she doesn't have as much discomfort in her chest, ribs or back. Patient said that she will be seeing by her oncologist tomorrow for f/u on CLL (cancer). Patient confirmed that she does sleep on a pillow at night. No c/o swelling, n/v. Patient was given an appointment to be seen on 02/28/18 and advised to discuss this with her oncologist tomorrow and that she should f/u with her PCP. Patient advised that if her oncologist or PCP felt this was cardiac related, to contact our office back for a sooner appointment. Patient advised that if her symptoms got worse, to go to the ED for an evaluation.  Patient verbalized understanding of plan.

## 2018-02-13 NOTE — Telephone Encounter (Signed)
Completed by Ubaldo Glassing patient aware in her appointment notes. Per 3/11 in basket marking done!

## 2018-02-14 ENCOUNTER — Telehealth: Payer: Self-pay | Admitting: Hematology

## 2018-02-14 ENCOUNTER — Encounter: Payer: Self-pay | Admitting: Hematology

## 2018-02-14 ENCOUNTER — Inpatient Hospital Stay (HOSPITAL_BASED_OUTPATIENT_CLINIC_OR_DEPARTMENT_OTHER): Payer: 59 | Admitting: Hematology

## 2018-02-14 ENCOUNTER — Inpatient Hospital Stay: Payer: 59 | Attending: Hematology

## 2018-02-14 VITALS — BP 143/61 | HR 68 | Temp 98.4°F | Resp 20 | Ht 63.0 in | Wt 206.9 lb

## 2018-02-14 DIAGNOSIS — D696 Thrombocytopenia, unspecified: Secondary | ICD-10-CM | POA: Diagnosis not present

## 2018-02-14 DIAGNOSIS — Z87442 Personal history of urinary calculi: Secondary | ICD-10-CM | POA: Diagnosis not present

## 2018-02-14 DIAGNOSIS — R7303 Prediabetes: Secondary | ICD-10-CM | POA: Insufficient documentation

## 2018-02-14 DIAGNOSIS — Z87891 Personal history of nicotine dependence: Secondary | ICD-10-CM | POA: Insufficient documentation

## 2018-02-14 DIAGNOSIS — F418 Other specified anxiety disorders: Secondary | ICD-10-CM | POA: Insufficient documentation

## 2018-02-14 DIAGNOSIS — R0789 Other chest pain: Secondary | ICD-10-CM | POA: Insufficient documentation

## 2018-02-14 DIAGNOSIS — R590 Localized enlarged lymph nodes: Secondary | ICD-10-CM | POA: Insufficient documentation

## 2018-02-14 DIAGNOSIS — M7989 Other specified soft tissue disorders: Secondary | ICD-10-CM | POA: Diagnosis not present

## 2018-02-14 DIAGNOSIS — K219 Gastro-esophageal reflux disease without esophagitis: Secondary | ICD-10-CM | POA: Diagnosis not present

## 2018-02-14 DIAGNOSIS — Z7952 Long term (current) use of systemic steroids: Secondary | ICD-10-CM | POA: Insufficient documentation

## 2018-02-14 DIAGNOSIS — C9110 Chronic lymphocytic leukemia of B-cell type not having achieved remission: Secondary | ICD-10-CM

## 2018-02-14 DIAGNOSIS — R161 Splenomegaly, not elsewhere classified: Secondary | ICD-10-CM | POA: Insufficient documentation

## 2018-02-14 DIAGNOSIS — R0602 Shortness of breath: Secondary | ICD-10-CM | POA: Insufficient documentation

## 2018-02-14 DIAGNOSIS — I10 Essential (primary) hypertension: Secondary | ICD-10-CM | POA: Diagnosis not present

## 2018-02-14 DIAGNOSIS — D649 Anemia, unspecified: Secondary | ICD-10-CM | POA: Diagnosis not present

## 2018-02-14 DIAGNOSIS — Z79899 Other long term (current) drug therapy: Secondary | ICD-10-CM | POA: Diagnosis not present

## 2018-02-14 DIAGNOSIS — G4733 Obstructive sleep apnea (adult) (pediatric): Secondary | ICD-10-CM | POA: Insufficient documentation

## 2018-02-14 LAB — CBC WITH DIFFERENTIAL (CANCER CENTER ONLY)
Basophils Absolute: 0.2 10*3/uL — ABNORMAL HIGH (ref 0.0–0.1)
Basophils Relative: 0 %
EOS ABS: 0.2 10*3/uL (ref 0.0–0.5)
Eosinophils Relative: 0 %
HCT: 34.4 % — ABNORMAL LOW (ref 34.8–46.6)
Hemoglobin: 10.6 g/dL — ABNORMAL LOW (ref 11.6–15.9)
LYMPHS ABS: 90.7 10*3/uL — AB (ref 0.9–3.3)
Lymphocytes Relative: 92 %
MCH: 30 pg (ref 25.1–34.0)
MCHC: 30.8 g/dL — AB (ref 31.5–36.0)
MCV: 97.5 fL (ref 79.5–101.0)
MONOS PCT: 1 %
Monocytes Absolute: 1.4 10*3/uL — ABNORMAL HIGH (ref 0.1–0.9)
Neutro Abs: 7 10*3/uL — ABNORMAL HIGH (ref 1.5–6.5)
Neutrophils Relative %: 7 %
PLATELETS: 105 10*3/uL — AB (ref 145–400)
RBC: 3.53 MIL/uL — ABNORMAL LOW (ref 3.70–5.45)
RDW: 14.4 % (ref 11.2–14.5)
WBC Count: 99.6 10*3/uL (ref 3.9–10.3)

## 2018-02-14 LAB — CMP (CANCER CENTER ONLY)
ALK PHOS: 64 U/L (ref 40–150)
ALT: 12 U/L (ref 0–55)
ANION GAP: 9 (ref 3–11)
AST: 18 U/L (ref 5–34)
Albumin: 4 g/dL (ref 3.5–5.0)
BILIRUBIN TOTAL: 0.6 mg/dL (ref 0.2–1.2)
BUN: 15 mg/dL (ref 7–26)
CALCIUM: 10.1 mg/dL (ref 8.4–10.4)
CO2: 25 mmol/L (ref 22–29)
Chloride: 101 mmol/L (ref 98–109)
Creatinine: 0.86 mg/dL (ref 0.60–1.10)
GFR, Estimated: >60 mL/min (ref >60)
Glucose, Bld: 99 mg/dL (ref 70–140)
Potassium: 4.5 mmol/L (ref 3.5–5.1)
SODIUM: 135 mmol/L — AB (ref 136–145)
TOTAL PROTEIN: 6.7 g/dL (ref 6.4–8.3)

## 2018-02-14 LAB — DIRECT ANTIGLOBULIN TEST (NOT AT ARMC)
DAT, IgG: NEGATIVE
DAT, complement: NEGATIVE

## 2018-02-14 LAB — RETICULOCYTES
RBC.: 3.53 MIL/uL — AB (ref 3.70–5.45)
RETIC CT PCT: 1.9 % (ref 0.7–2.1)
Retic Count, Absolute: 67.1 10*3/uL (ref 33.7–90.7)

## 2018-02-14 LAB — LACTATE DEHYDROGENASE: LDH: 195 U/L (ref 125–245)

## 2018-02-14 NOTE — Telephone Encounter (Signed)
appts already scheduled for  May - Gave patient [facility] to contact patient with ct scan .Message left for PFT ([facility]) for them to call back to set test.

## 2018-02-14 NOTE — Telephone Encounter (Signed)
Acknowledged and agree. 

## 2018-02-14 NOTE — Progress Notes (Signed)
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 (patient reports -planned for August 2017) 13q deletion does pre-dispose her to recurrent SCC  Current Treatment: observation  Previous treatment: IVIG for several months last winter to reduce recurrent respiratory infections (Patient notes that this helped) Has not required definitive treatment for CLL at this time and has been reluctant to consider treatment recently.  INTERVAL HISTORY:  Rebecca Bond is here for her scheduled f/u for CLL. The patient's last visit with [place] was on 01/1418. The pt reports that she is doing well overall.   Of note since the patient's last visit, pt has had CXR completed on 02/07/18 with results revealing No acute abnormalities.  After having back surgery on 11/01/17 on L4-5, she notes that in the last couple weeks she has developed significant back, chest and rib pain. She notes that her pain presents in the early morning all of a sudden accompanied by SOB. She denies concerns for anxiety at this time.  She has spoken with her cardiologist and has been checked for d-dimer, and was noted to not have any blood clots. She denies having A-fib. She reports using an inhaler at one time.   She notes using a CPAP for 3-4 years and has sleep apnea.  She notes pus at the site of her recent skin biopsy. She will follow up about her back surgery on 02/23/18 and will discuss the possibility of a pinched nerve.   She notes a recent bladder infection and has been prescribed Prednisone but was waiting to take it until after she saw [place].   Lab results today (02/14/18) of CBC and  Reticulocytes is as follows: all values are WNL except for WBC at 99.6k, RBC at 3.53, Hgb at 10.6, HCT at 34.4, MCHC at 30.8, . LDH 02/14/18 is WNL at 195. Direct antiglobulin test 02/14/18 is negative.   On review of systems, pt reports intermittent chest tightness with SOB, mild leg swelling, decreased energy levels, and denies fevers, chills, night sweats, asthma, new lumps or bumps, pain along the spine, and any other symptoms.   REVIEW OF SYSTEMS:    .10 Point review of Systems was done is negative except as noted above.  . Past Medical History:  Diagnosis Date  . Anemia   . Anxiety   . Arthritis   . CLL (chronic lymphocytic leukemia) ([place])   . Depression   . GERD (gastroesophageal reflux disease)   . Headache    "sinus headaches"  . History of kidney stones   . Hypertension   . OSA (obstructive sleep apnea)   . Pneumonia   . Prediabetes   . Recurrent sinus infections 08/02/2012    . Past Surgical History:  Procedure Laterality Date  . ABDOMINAL HYSTERECTOMY  1980's   "endometrosis"  . APPENDECTOMY    . BREAST SURGERY    . CATARACT EXTRACTION, BILATERAL    . CHOLECYSTECTOMY  1985  . FUNCTIONAL ENDOSCOPIC SINUS SURGERY  1990's   "cause I kept having sinus infections"  . immunoglobulin treatment  2017  . LAPAROSCOPIC APPENDECTOMY N/A 06/04/2014   Procedure: APPENDECTOMY LAPAROSCOPIC;  Surgeon: Lavone Neri  Ellison Carwin, MD;  Location: [facility];  Service: General;  Laterality: N/A;  . LUMBAR LAMINECTOMY/DECOMPRESSION MICRODISCECTOMY Left 11/01/2017   Procedure: Left Lumbar Four-Five Extraforaminal Microdiscectomy;  Surgeon: Eustace Moore, MD;  Location: [facility];  Service: Neurosurgery;  Laterality: Left;  . LYMPH NODE BIOPSY     "determined I had CLL"  . SHOULDER ARTHROSCOPY Right   . TEMPOROMANDIBULAR JOINT ARTHROPLASTY  1980's  . TONSILLECTOMY AND ADENOIDECTOMY  1950's    . Social History   Tobacco Use  . Smoking status: Former Smoker    Packs/day: 0.75    Years: 4.00     Pack years: 3.00    Types: Cigarettes    Last attempt to quit: 12/05/1968    Years since quitting: 49.2  . Smokeless tobacco: Never Used  Substance Use Topics  . Alcohol use: Yes    Alcohol/week: 4.2 oz    Types: 7 Glasses of wine per week  . Drug use: No    ALLERGIES:  is allergic to hyzaar [losartan potassium-hctz]; sudafed [pseudoephedrine hcl]; levaquin [levofloxacin in d5w]; acyclovir and related; biaxin [clarithromycin]; celexa [citalopram]; flexeril [cyclobenzaprine]; fosamax [alendronate sodium]; iohexol; losartan; meloxicam; norvasc [amlodipine]; pseudoephedrine; and zoloft [sertraline hcl].  MEDICATIONS:  Current Outpatient Medications  Medication Sig Dispense Refill  . ALPRAZolam (XANAX) 1 MG tablet TAKE 1/2 TO 1 TABLET BY MOUTH THREE TIMES A DAY AS NEEDED 90 tablet 0  . Ascorbic Acid (VITAMIN C) 1000 MG tablet Take 1,500 mg by mouth daily.     . bisoprolol-hydrochlorothiazide (ZIAC) 10-6.25 MG tablet TAKE ONE TABLET BY MOUTH DAILY 90 tablet 1  . Cholecalciferol (D 2000) 2000 units TABS Take 10,000 Units by mouth daily.     . DiphenhydrAMINE HCl (BENADRYL ALLERGY PO) Take by mouth. Take 2 tablets daily    . doxycycline (VIBRAMYCIN) 100 MG capsule Take 1 capsule twice daily with food 20 capsule 0  . escitalopram (LEXAPRO) 10 MG tablet TAKE ONE TABLET BY MOUTH DAILY FOR MOOD 90 tablet 1  . ezetimibe (ZETIA) 10 MG tablet TAKE ONE TABLET BY MOUTH DAILY 90 tablet 1  . fexofenadine (ALLEGRA) 180 MG tablet Take 1 tablet (180 mg total) by mouth daily. Take PRN for allergies. 30 tablet 6  . omeprazole (PRILOSEC) 40 MG capsule Take 1 capsule (40 mg total) by mouth daily. 14 capsule 1  . Polyethyl Glycol-Propyl Glycol (SYSTANE OP) Apply 2 drops daily to eye.    . predniSONE (DELTASONE) 20 MG tablet 1 tab 3 x day for 3 days, then 1 tab 2 x day for 3 days, then 1 tab 1 x day for 5 days 20 tablet 0  . Probiotic Product (PROBIOTIC PO) Take 1 tablet daily by mouth.    . triamcinolone  (NASACORT) 55 MCG/ACT AERO nasal inhaler Place 2 sprays daily as needed into the nose.    . Turmeric 400 MG CAPS Take 800 mg by mouth 2 (two) times daily.    . furosemide (LASIX) 40 MG tablet Take 20 mg daily as needed by mouth for fluid or edema.     . methocarbamol (ROBAXIN) 500 MG tablet Take 1 tablet (500 mg total) by mouth every 6 (six) hours as needed for muscle spasms. (Patient not taking: Reported on 02/07/2018) 30 tablet 0  . ondansetron (ZOFRAN) 4 MG tablet Take 1 tablet (4 mg total) by mouth daily as needed for nausea or vomiting. (Patient not taking: Reported on 02/07/2018) 30 tablet 1   No current facility-administered medications for this visit.  PHYSICAL EXAMINATION: ECOG PERFORMANCE STATUS: 1 - Symptomatic but completely ambulatory  . Vitals:   02/14/18 1524  BP: (!) 143/61  Pulse: 68  Resp: 20  Temp: 98.4 F (36.9 C)  SpO2: 97%    Filed Weights   02/14/18 1524  Weight: 206 lb 14.4 oz (93.8 kg)   .Body mass index is 36.65 kg/m.  [name] GENERAL:alert, in no acute distress and comfortable SKIN: no acute rashes, no significant lesions EYES: conjunctiva are pink and non-injected, sclera anicteric OROPHARYNX: MMM, no exudates, no oropharyngeal erythema or ulceration NECK: supple, no JVD LYMPH:  no palpable lymphadenopathy in the cervical, axillary or inguinal regions LUNGS: clear to auscultation b/l with normal respiratory effort HEART: regular rate & rhythm ABDOMEN:  normoactive bowel sounds , non tender, not distended. Extremity: no pedal edema PSYCH: alert & oriented x 3 with fluent speech NEURO: no focal motor/sensory deficits    LABORATORY DATA:   .[name] CBC Latest Ref Rng & Units 02/14/2018 02/07/2018 01/18/2018  WBC 3.9 - 10.3 K/uL 99.6(HH) 119.3(H) 89.7(HH)  Hemoglobin 11.7 - 15.5 g/dL - 10.3(L) -  Hematocrit 34.8 - 46.6 % 34.4(L) 32.5(L) 34.4(L)  Platelets 145 - 400 K/uL 105(L) 115(L) 93(L)  hgb 10.6 . CBC    Component Value Date/Time   WBC 99.6 (HH)  02/14/2018 1459   WBC 119.3 (H) 02/07/2018 1149   RBC 3.53 (L) 02/14/2018 1459   RBC 3.53 (L) 02/14/2018 1459   HGB 10.3 (L) 02/07/2018 1149   HGB 11.2 (L) 11/08/2017 1418   HCT 34.4 (L) 02/14/2018 1459   HCT 34.8 11/08/2017 1418   PLT 105 (L) 02/14/2018 1459   PLT 140 (L) 11/08/2017 1418   MCV 97.5 02/14/2018 1459   MCV 96.4 11/08/2017 1418   MCH 30.0 02/14/2018 1459   MCHC 30.8 (L) 02/14/2018 1459   RDW 14.4 02/14/2018 1459   RDW 14.8 (H) 11/08/2017 1418   LYMPHSABS 90.7 (H) 02/14/2018 1459   LYMPHSABS 87.0 (H) 11/08/2017 1418   MONOABS 1.4 (H) 02/14/2018 1459   MONOABS 1.5 (H) 11/08/2017 1418   EOSABS 0.2 02/14/2018 1459   EOSABS 0.6 (H) 11/08/2017 1418   BASOSABS 0.2 (H) 02/14/2018 1459   BASOSABS 0.2 (H) 11/08/2017 1418      CMP Latest Ref Rng & Units 02/14/2018 02/07/2018 01/18/2018  Glucose 70 - 140 mg/dL 99 98 94  BUN 7 - 26 mg/dL 15 17 8   Creatinine 0.60 - 1.10 mg/dL 0.86 0.86 0.83  Sodium 136 - 145 mmol/L 135(L) 138 135(L)  Potassium 3.5 - 5.1 mmol/L 4.5 3.8 3.8  Chloride 98 - 109 mmol/L 101 101 100  CO2 22 - 29 mmol/L 25 25 23   Calcium 8.4 - 10.4 mg/dL 10.1 9.6 8.9  Total Protein 6.4 - 8.3 g/dL 6.7 6.5 6.5  Total Bilirubin 0.2 - 1.2 mg/dL 0.6 0.7 0.5  Alkaline Phos 40 - 150 U/L 64 - 67  AST 5 - 34 U/L 18 20 25   ALT 0 - 55 U/L 12 17 17       RADIOGRAPHIC STUDIES: I have personally reviewed the radiological images as listed and agreed with the findings in the report.  CT ABDOMEN AND PELVIS WITHOUT CONTRAST   IMPRESSION: 1. No acute abnormalities within the abdomen or pelvis. 2. Mild splenomegaly and several prominent to mildly enlarged pelvic and inguinal lymph nodes. These findings are similar to the prior CT. 3. Small nonobstructing stone in the lower pole the right kidney. No ureteral stones or obstructive uropathy.   Electronically Signed  By: Lajean Manes M.D.   On: 11/14/2016 16:39    ASSESSMENT & PLAN:   72 y.o.  caucasian female  with   1.Rai Stage 2 previously - now Stage IV CLL with lymphocytosis, LNadenopathy and mild splenomegaly with anemia and thrombocytopenia. -patient has previously and continues to desire holding off CLL directed treatment.  Plan:  -infections precautions. -Discussed pt labwork today; LDH is WNL at 195.  . Lab Results  Component Value Date   LDH 195 02/14/2018   -Given her significant bone pains, we will order a CT Chest and check for any progression of [name] in lymph nodes.  PFTs in 2-3 days. CT chest in 5 days -Will order lung function testing as well considering her SOB. -Discussed with pt that she will follow up with surgeon later this month about possible nerve pinching with an MRI.   2.  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 Has been taking good infection prevention precautions. CLL initially diagnosed 2009 with 13q deletion.. Only intervention thus far has been intermittent IVIG, clinically very helpful with decrease in respiratory infections.  Last imaging was CT AP 02-2015. --showed no evidence of Splenomegaly. Negative Hep B serology 2015  No overt new constitutional symptoms except mild unchanged fatigue. Has had significant recurrent headaches with IVIG - now held.  2. Mild Anemia Hgb hemoglobin stable in the 10.5-11 range. Hgb stable at 10.6 today 02/14/18.   3. Moderate thrombocytopenia PLT count at 93k has recently been in the 80-90k range. Did receive platelets pre-operatively Platelets today are at 105k.   4. H/o  Recurrent cutaneous SCC s/p Mohs surgery for SCC of the nosehealed. No issues with infection-   her 13q deletion places her at risk for recurrent SCC. -continue close f/u with dermatologist for evaluation and management of non melanoma skin cancers that can be increased in patient with CLL with 13q deletion.  5. Recent  L spine disectomy. - still with some post-operative pain being managed by ortho.   PFTs in 2-3 days. CT chest in 5 days RTC with Dr  Irene Limbo as per appointment in may   . The total time spent in the appointment was 25 minutes and more than 50% was on counseling and direct patient cares.     Sullivan Lone MD [place] AAHIVMS [facility] Hematology/Oncology Physician [place]  (Office):       [phone] (Work cell):  [phone] (Fax):           [phone]   This document serves as a record of services personally performed by Sullivan Lone, MD. It was created on his behalf by Baldwin Jamaica, a trained medical scribe. The creation of this record is based on the scribe's personal observations and the provider's statements to them.   .I have reviewed the above documentation for accuracy and completeness, and I agree with the above. Brunetta Genera MD MS

## 2018-02-20 ENCOUNTER — Other Ambulatory Visit: Payer: Self-pay | Admitting: Hematology

## 2018-02-20 DIAGNOSIS — R0602 Shortness of breath: Secondary | ICD-10-CM

## 2018-02-21 ENCOUNTER — Other Ambulatory Visit: Payer: 59

## 2018-02-21 ENCOUNTER — Ambulatory Visit (HOSPITAL_COMMUNITY)
Admission: RE | Admit: 2018-02-21 | Discharge: 2018-02-21 | Disposition: A | Discharge status: Home / Self Care | Payer: 59 | Source: Ambulatory Visit | Attending: Hematology | Admitting: Hematology

## 2018-02-21 ENCOUNTER — Ambulatory Visit: Payer: 59 | Admitting: Hematology

## 2018-02-21 DIAGNOSIS — R0602 Shortness of breath: Secondary | ICD-10-CM

## 2018-02-21 LAB — PULMONARY FUNCTION TEST
DL/VA % pred: 94 %
DL/VA: 4.41 ml/min/mmHg/L
DLCO cor % pred: 63 %
DLCO cor: 14.49 ml/min/mmHg
DLCO unc % pred: 57 %
DLCO unc: 13.06 ml/min/mmHg
FEF 25-75 Post: 1.82 L/sec
FEF 25-75 Pre: 1.41 L/sec
FEF2575-%Change-Post: 28 %
FEF2575-%Pred-Post: 102 %
FEF2575-%Pred-Pre: 79 %
FEV1-%Change-Post: 4 %
FEV1-%Pred-Post: 78 %
FEV1-%Pred-Pre: 74 %
FEV1-Post: 1.67 L
FEV1-Pre: 1.59 L
FEV1FVC-%Change-Post: 6 %
FEV1FVC-%Pred-Pre: 103 %
FEV6-%Change-Post: 5 %
FEV6-%Pred-Post: 73 %
FEV6-%Pred-Pre: 69 %
FEV6-Post: 1.98 L
FEV6-Pre: 1.87 L
FEV6FVC-%Change-Post: 7 %
FEV6FVC-%Pred-Post: 105 %
FEV6FVC-%Pred-Pre: 97 %
FVC-%Change-Post: -1 %
FVC-%Pred-Post: 70 %
FVC-%Pred-Pre: 71 %
FVC-Post: 2 L
FVC-Pre: 2.03 L
Post FEV1/FVC ratio: 84 %
Post FEV6/FVC ratio: 100 %
Pre FEV1/FVC ratio: 78 %
Pre FEV6/FVC Ratio: 93 %
RV % pred: 60 %
RV: 1.32 L
TLC % pred: 72 %
TLC: 3.55 L

## 2018-02-21 MED ORDER — ALBUTEROL SULFATE (2.5 MG/3ML) 0.083% IN NEBU
2.5000 mg | INHALATION_SOLUTION | Freq: Once | RESPIRATORY_TRACT | Status: AC
Start: 2018-02-21 — End: 2018-02-21
  Administered 2018-02-21: 2.5 mg via RESPIRATORY_TRACT

## 2018-02-23 ENCOUNTER — Telehealth: Payer: Self-pay | Admitting: *Deleted

## 2018-02-23 NOTE — Telephone Encounter (Signed)
Pt LVM requesting results of PFT.  Results printed and given to MD.  Waiting on reply from MD for what to advise patient.

## 2018-02-26 NOTE — Progress Notes (Signed)
Cardiology Office Note   Date:  02/28/2018   ID:  SHALINI MAIR, DOB 11-Feb-1946, MRN [id number]  PCP:  [name], MD  Cardiologist:  Dr. Claiborne Billings Chief Complaint  Patient presents with  . Hypertension     History of Present Illness: Rebecca Bond is a 72 y.o. female who presents for ongoing assessment and management of hypertension, with other history to include morbid obesity, OSA on CPAP, GERD, CLL, and "prediabetes."  The patient was last seen by Dr. Claiborne Billings on 09/13/2017, with complaints of lower extremity edema.  Also for preoperative evaluation for back surgery.  A Lexiscan Myoview was ordered to evaluate her for diagnostic prognostic purposes.  The patient is very anxious about her current medical issues.  She was found to be mildly hypertensive.  She was advised to continue Lasix as directed and HCTZ daily, and to a low-sodium diet.  Lexiscan Myoview was negative for ischemia and found to be a low risk.  She was cleared for surgery.  The patient called our office on 02/13/2018 complaining of chest ribs and back pain.  When she starts moving around the pain stopped.  A follow-up appointment was made.  She comes today with multiple complaints concerning musculoskeletal issues.  She apparently has had lumbar spine surgery but is now having complaints of thoracic spine pain radiating around to her waist in a bandlike fashion.  No associated shortness of breath dizziness or nausea.  The patient states that getting out of bed is very difficult for her due to the severe pain in her back.  She is also tripped and injured her left foot and ankle, tearing some ligaments.  She is now in a walking cast.  She has been seen often by her PCP as well as orthopedic surgeon.  Is due to follow-up with surgeon 2 weeks and with PCP as well.  She has not been seen by Dr. Claiborne Billings in 3 years nor has she had her CPAP evaluated.  She is requesting follow-up OSA and CPAP  Past Medical History:  Diagnosis  Date  . Anemia   . Anxiety   . Arthritis   . CLL (chronic lymphocytic leukemia) ([place])   . Depression   . GERD (gastroesophageal reflux disease)   . Headache    "sinus headaches"  . History of kidney stones   . Hypertension   . OSA (obstructive sleep apnea)   . Pneumonia   . Prediabetes   . Recurrent sinus infections 08/02/2012    Past Surgical History:  Procedure Laterality Date  . ABDOMINAL HYSTERECTOMY  1980's   "endometrosis"  . APPENDECTOMY    . BREAST SURGERY    . CATARACT EXTRACTION, BILATERAL    . CHOLECYSTECTOMY  1985  . FUNCTIONAL ENDOSCOPIC SINUS SURGERY  1990's   "cause I kept having sinus infections"  . immunoglobulin treatment  2017  . LAPAROSCOPIC APPENDECTOMY N/A 06/04/2014   Procedure: APPENDECTOMY LAPAROSCOPIC;  Surgeon: Zenovia Jarred, MD;  Location: [facility];  Service: General;  Laterality: N/A;  . LUMBAR LAMINECTOMY/DECOMPRESSION MICRODISCECTOMY Left 11/01/2017   Procedure: Left Lumbar Four-Five Extraforaminal Microdiscectomy;  Surgeon: Eustace Moore, MD;  Location: [facility];  Service: Neurosurgery;  Laterality: Left;  . LYMPH NODE BIOPSY     "determined I had CLL"  . SHOULDER ARTHROSCOPY Right   . TEMPOROMANDIBULAR JOINT ARTHROPLASTY  1980's  . TONSILLECTOMY AND ADENOIDECTOMY  1950's     Current Outpatient Medications  Medication Sig Dispense Refill  . ALPRAZolam (XANAX) 1 MG tablet  TAKE 1/2 TO 1 TABLET BY MOUTH THREE TIMES A DAY AS NEEDED 90 tablet 0  . Ascorbic Acid (VITAMIN C) 1000 MG tablet Take 1,500 mg by mouth daily.     . bisoprolol-hydrochlorothiazide (ZIAC) 10-6.25 MG tablet TAKE ONE TABLET BY MOUTH DAILY 90 tablet 1  . Cholecalciferol (D 2000) 2000 units TABS Take 10,000 Units by mouth daily.     . DiphenhydrAMINE HCl (BENADRYL ALLERGY PO) Take by mouth. Take 2 tablets daily    . escitalopram (LEXAPRO) 10 MG tablet TAKE ONE TABLET BY MOUTH DAILY FOR MOOD 90 tablet 1  . ezetimibe (ZETIA) 10 MG tablet TAKE ONE TABLET BY MOUTH DAILY 90 tablet 1    . fexofenadine (ALLEGRA) 180 MG tablet Take 1 tablet (180 mg total) by mouth daily. Take PRN for allergies. 30 tablet 6  . Polyethyl Glycol-Propyl Glycol (SYSTANE OP) Apply 2 drops daily to eye.    . predniSONE (DELTASONE) 20 MG tablet 1 tab 3 x day for 3 days, then 1 tab 2 x day for 3 days, then 1 tab 1 x day for 5 days 20 tablet 0  . Probiotic Product (PROBIOTIC PO) Take 1 tablet daily by mouth.    . triamcinolone (NASACORT) 55 MCG/ACT AERO nasal inhaler Place 2 sprays daily as needed into the nose.    . Turmeric 400 MG CAPS Take 800 mg by mouth 2 (two) times daily.    . furosemide (LASIX) 40 MG tablet Take 20 mg daily as needed by mouth for fluid or edema.      No current facility-administered medications for this visit.     Allergies:   Hyzaar [losartan potassium-hctz]; Sudafed [pseudoephedrine hcl]; Levaquin [levofloxacin in d5w]; Acyclovir and related; Biaxin [clarithromycin]; Celexa [citalopram]; Flexeril [cyclobenzaprine]; Fosamax [alendronate sodium]; Iohexol; Losartan; Meloxicam; Norvasc [amlodipine]; Pseudoephedrine; and Zoloft [sertraline hcl]    Social History:  The patient  reports that she quit smoking about 49 years ago. Her smoking use included cigarettes. She has a 3.00 pack-year smoking history. She has never used smokeless tobacco. She reports that she drinks about 4.2 oz of alcohol per week. She reports that she does not use drugs.   Family History:  The patient's family history includes Heart attack in her maternal grandmother; Hypertension in her maternal grandmother and mother; Parkinson's disease in her mother.    ROS: All other systems are reviewed and negative. Unless otherwise mentioned in H&P    PHYSICAL EXAM: VS:  BP (!) 160/76 (BP Location: Left Arm, Patient Position: Sitting, Cuff Size: Normal)   Pulse 75   Ht 5\' 3"  (1.6 m)   Wt 211 lb 9.6 oz (96 kg)   SpO2 99%   BMI 37.48 kg/m  , BMI Body mass index is 37.48 kg/m. GEN: Well nourished, well developed,  in no acute distress obese HEENT: normal  Neck: no JVD, carotid bruits, or masses Cardiac: RRR; no murmurs, rubs, or gallops,no edema  Respiratory:  Clear to auscultation bilaterally, normal work of breathing GI: soft, nontender, nondistended, + BS MS: no deformity or atrophy she has left foot brace in place Skin: warm and dry, no rash Neuro:  Strength is diminished and sensation are intact Psych: euthymic mood, full affect   Recent Labs: 02/07/2018: Brain Natriuretic Peptide 120; Hemoglobin 10.3; Magnesium 1.8; TSH 4.17 02/14/2018: ALT 12; BUN 15; Creatinine 0.86; Platelet Count 105; Potassium 4.5; Sodium 135    Lipid Panel    Component Value Date/Time   CHOL 198 02/07/2018 1149   TRIG 132  02/07/2018 1149   HDL 59 02/07/2018 1149   CHOLHDL 3.4 02/07/2018 1149   VLDL 25 05/31/2017 1727   LDLCALC 114 (H) 02/07/2018 1149      Wt Readings from Last 3 Encounters:  02/28/18 211 lb 9.6 oz (96 kg)  02/14/18 206 lb 14.4 oz (93.8 kg)  02/07/18 209 lb (94.8 kg)    Other studies Reviewed: MRI of the spine 04/05/2017 IMPRESSION: The dominant LEFT-sided abnormality is at L4-5, where a large disc extrusion results in predominant LEFT L4 nerve root impingement. Slight subarticular zone narrowing could affect the LEFT L5 nerve root at this level. Facet arthropathy is contributory, with ligamentum flavum hypertrophy, and small synovial cyst also greater on the LEFT.  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 Near complete loss of interspace height, osseous spurring, mild retrolisthesis, posterior element hypertrophy, and central and rightward protrusion. Mild stenosis at this level affecting the RIGHT greater than LEFT L2 and L3 nerve roots.  Nuclear medicine stress test, 10/16, 2018 Study Highlights     Nuclear stress EF: 66%.  The left ventricular ejection fraction is hyperdynamic (>65%).  There was no ST segment deviation noted during stress.  This is a low risk study.   1.  EF 66%, normal wall motion.  2. Reversible small mild mid anterior perfusion defect.  Suspect this may be due to shifting breast artifact, less likely small area of ischemia.   Low risk study.     ASSESSMENT AND PLAN:  1.  Hypertension: Blood pressure is elevated today.  She states she has "white coat syndrome" she is medically compliant with all of her antihypertensives.  Labs have recently been drawn by PCP.  She is free counseled on low-sodium diet.  She states that she does not add salt to her foods and is been trying very hard to avoid salted foods.  2.  OSA with CPAP: Is followed by Dr. Claiborne Billings, however she has not seen him in 3 years.  I am going to have Rebecca Bond, Dr. Evette Georges nurse, see her today to discuss and  arrange follow-up  3.  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  Will follow with PCP and orthopedist  Current medicines are reviewed at length with the patient today.    Labs/ tests ordered today include: none Phill Myron. [place] Pugh, ANP, AACC   02/28/2018 8:04 AM    [facility] [address], [place], [place] [address] Phone: [phone]; Fax: [phone]

## 2018-02-28 ENCOUNTER — Ambulatory Visit: Payer: 59 | Admitting: Adult Health

## 2018-02-28 ENCOUNTER — Ambulatory Visit (HOSPITAL_COMMUNITY): Payer: 59

## 2018-02-28 ENCOUNTER — Encounter: Payer: Self-pay | Admitting: Adult Health

## 2018-02-28 VITALS — BP 160/76 | HR 75 | Ht 63.0 in | Wt 211.6 lb

## 2018-02-28 DIAGNOSIS — G4733 Obstructive sleep apnea (adult) (pediatric): Secondary | ICD-10-CM

## 2018-02-28 DIAGNOSIS — I10 Essential (primary) hypertension: Secondary | ICD-10-CM

## 2018-02-28 DIAGNOSIS — Z9989 Dependence on other enabling machines and devices: Secondary | ICD-10-CM | POA: Diagnosis not present

## 2018-02-28 NOTE — Patient Instructions (Signed)
Medication Instructions:  NO CHANGES- Your physician recommends that you continue on your current medications as directed. Please refer to the Current Medication list given to you today.  If you need a refill on your cardiac medications before your next appointment, please call your pharmacy.  Follow-Up: Your physician wants you to follow-up in: 6 months with DR [facility]. You should receive a reminder letter in the mail two months in advance. If you do not receive a letter, please call our office 07-2018 to schedule the 09-2018 follow-up appointment.   Thank you for choosing [facility] at [facility]!!

## 2018-03-06 ENCOUNTER — Other Ambulatory Visit: Payer: Self-pay | Admitting: Neurological Surgery

## 2018-03-07 ENCOUNTER — Ambulatory Visit (HOSPITAL_COMMUNITY)
Admission: RE | Admit: 2018-03-07 | Discharge: 2018-03-07 | Disposition: A | Discharge status: Home / Self Care | Payer: 59 | Source: Ambulatory Visit | Attending: Hematology | Admitting: Hematology

## 2018-03-07 ENCOUNTER — Encounter (HOSPITAL_COMMUNITY): Payer: Self-pay

## 2018-03-07 DIAGNOSIS — R918 Other nonspecific abnormal finding of lung field: Secondary | ICD-10-CM | POA: Diagnosis not present

## 2018-03-07 DIAGNOSIS — R590 Localized enlarged lymph nodes: Secondary | ICD-10-CM | POA: Diagnosis not present

## 2018-03-07 DIAGNOSIS — I2510 Atherosclerotic heart disease of native coronary artery without angina pectoris: Secondary | ICD-10-CM | POA: Diagnosis not present

## 2018-03-07 DIAGNOSIS — Z856 Personal history of leukemia: Secondary | ICD-10-CM | POA: Insufficient documentation

## 2018-03-07 DIAGNOSIS — R0602 Shortness of breath: Secondary | ICD-10-CM | POA: Insufficient documentation

## 2018-03-07 DIAGNOSIS — I700 Atherosclerosis of aorta: Secondary | ICD-10-CM | POA: Insufficient documentation

## 2018-03-08 ENCOUNTER — Other Ambulatory Visit: Payer: Self-pay | Admitting: Neurological Surgery

## 2018-03-08 DIAGNOSIS — M5126 Other intervertebral disc displacement, lumbar region: Secondary | ICD-10-CM

## 2018-03-08 DIAGNOSIS — M546 Pain in thoracic spine: Secondary | ICD-10-CM

## 2018-03-11 ENCOUNTER — Ambulatory Visit
Admission: RE | Admit: 2018-03-11 | Discharge: 2018-03-11 | Disposition: A | Discharge status: Home / Self Care | Payer: 59 | Source: Ambulatory Visit | Attending: Neurological Surgery | Admitting: Neurological Surgery

## 2018-03-11 DIAGNOSIS — M546 Pain in thoracic spine: Secondary | ICD-10-CM

## 2018-03-11 DIAGNOSIS — M5126 Other intervertebral disc displacement, lumbar region: Secondary | ICD-10-CM

## 2018-03-20 ENCOUNTER — Telehealth: Payer: Self-pay | Admitting: *Deleted

## 2018-03-20 NOTE — Telephone Encounter (Signed)
Received voice mail message from patient stating,"I would like results from my CT scan and PFT. Return number is [phone]." I informed patient that Dr. Irene Limbo is out of the office for two weeks and when he gets back he will review results with you. She verbalized understanding.

## 2018-03-29 ENCOUNTER — Encounter: Payer: Self-pay | Admitting: Internal Medicine

## 2018-03-30 ENCOUNTER — Telehealth: Payer: Self-pay | Admitting: *Deleted

## 2018-03-30 NOTE — Telephone Encounter (Signed)
Patient states she needs to have an epidural for her back, unable to sleep in her bed. Is sleeping on her couch.  and wants dr Grier Mitts nurse to call her, if it is okay to have this done.

## 2018-04-03 NOTE — Telephone Encounter (Signed)
OK to be done as long as platelet counts >75k thx [place]

## 2018-04-04 ENCOUNTER — Telehealth: Payer: Self-pay

## 2018-04-04 NOTE — Telephone Encounter (Signed)
Called pt and left VM to determine if location where she is receiving epidural will draw lab work prior. Dr. Irene Limbo okay with epidural being performed as long as plt count is greater than 75. If they will not draw labs, pt can come in Friday afternoon for lab work. Waiting for return call to know whether to schedule or send lab work request to other facility.

## 2018-04-05 ENCOUNTER — Other Ambulatory Visit: Payer: Self-pay

## 2018-04-05 ENCOUNTER — Telehealth: Payer: Self-pay

## 2018-04-05 DIAGNOSIS — C9110 Chronic lymphocytic leukemia of B-cell type not having achieved remission: Secondary | ICD-10-CM

## 2018-04-05 NOTE — Telephone Encounter (Signed)
Patient needed f/u labs prior to epidural scheduled on 04/13/18. Pt appt created tomorrow afternoon. Pt aware of appt and lab orders placed. Pt f/u with Dr. Irene Limbo on 04/18/18 and lab appt cancelled.

## 2018-04-06 ENCOUNTER — Inpatient Hospital Stay: Payer: 59 | Attending: Hematology

## 2018-04-06 DIAGNOSIS — C9110 Chronic lymphocytic leukemia of B-cell type not having achieved remission: Secondary | ICD-10-CM

## 2018-04-06 DIAGNOSIS — R161 Splenomegaly, not elsewhere classified: Secondary | ICD-10-CM | POA: Insufficient documentation

## 2018-04-06 DIAGNOSIS — I10 Essential (primary) hypertension: Secondary | ICD-10-CM | POA: Diagnosis not present

## 2018-04-06 DIAGNOSIS — Z881 Allergy status to other antibiotic agents status: Secondary | ICD-10-CM | POA: Insufficient documentation

## 2018-04-06 DIAGNOSIS — K219 Gastro-esophageal reflux disease without esophagitis: Secondary | ICD-10-CM | POA: Insufficient documentation

## 2018-04-06 DIAGNOSIS — Z9889 Other specified postprocedural states: Secondary | ICD-10-CM | POA: Insufficient documentation

## 2018-04-06 DIAGNOSIS — Z87891 Personal history of nicotine dependence: Secondary | ICD-10-CM | POA: Insufficient documentation

## 2018-04-06 DIAGNOSIS — I700 Atherosclerosis of aorta: Secondary | ICD-10-CM | POA: Insufficient documentation

## 2018-04-06 DIAGNOSIS — G4733 Obstructive sleep apnea (adult) (pediatric): Secondary | ICD-10-CM | POA: Insufficient documentation

## 2018-04-06 DIAGNOSIS — Z79899 Other long term (current) drug therapy: Secondary | ICD-10-CM | POA: Insufficient documentation

## 2018-04-06 DIAGNOSIS — R590 Localized enlarged lymph nodes: Secondary | ICD-10-CM | POA: Insufficient documentation

## 2018-04-06 DIAGNOSIS — Z888 Allergy status to other drugs, medicaments and biological substances status: Secondary | ICD-10-CM | POA: Insufficient documentation

## 2018-04-06 DIAGNOSIS — R51 Headache: Secondary | ICD-10-CM | POA: Insufficient documentation

## 2018-04-06 DIAGNOSIS — R7303 Prediabetes: Secondary | ICD-10-CM | POA: Insufficient documentation

## 2018-04-06 DIAGNOSIS — I2510 Atherosclerotic heart disease of native coronary artery without angina pectoris: Secondary | ICD-10-CM | POA: Insufficient documentation

## 2018-04-06 LAB — CMP (CANCER CENTER ONLY)
ALK PHOS: 66 U/L (ref 40–150)
ALT: 13 U/L (ref 0–55)
ANION GAP: 7 (ref 3–11)
AST: 24 U/L (ref 5–34)
Albumin: 4 g/dL (ref 3.5–5.0)
BUN: 18 mg/dL (ref 7–26)
CALCIUM: 9.7 mg/dL (ref 8.4–10.4)
CO2: 26 mmol/L (ref 22–29)
Chloride: 102 mmol/L (ref 98–109)
Creatinine: 0.77 mg/dL (ref 0.60–1.10)
GFR, Est AFR Am: >60 mL/min (ref >60)
Glucose, Bld: 94 mg/dL (ref 70–140)
POTASSIUM: 3.7 mmol/L (ref 3.5–5.1)
Sodium: 135 mmol/L — ABNORMAL LOW (ref 136–145)
TOTAL PROTEIN: 6.5 g/dL (ref 6.4–8.3)
Total Bilirubin: 0.4 mg/dL (ref 0.2–1.2)

## 2018-04-06 LAB — CBC WITH DIFFERENTIAL (CANCER CENTER ONLY)
Basophils Absolute: 0.1 K/uL (ref 0.0–0.1)
Basophils Relative: 0 %
Eosinophils Absolute: 0.3 K/uL (ref 0.0–0.5)
Eosinophils Relative: 0 %
HCT: 31.7 % — ABNORMAL LOW (ref 34.8–46.6)
Hemoglobin: 10.2 g/dL — ABNORMAL LOW (ref 11.6–15.9)
Lymphocytes Relative: 92 %
Lymphs Abs: 72.7 K/uL — ABNORMAL HIGH (ref 0.9–3.3)
MCH: 30.4 pg (ref 25.1–34.0)
MCHC: 32.2 g/dL (ref 31.5–36.0)
MCV: 94.3 fL (ref 79.5–101.0)
Monocytes Absolute: 1.1 K/uL — ABNORMAL HIGH (ref 0.1–0.9)
Monocytes Relative: 1 %
Neutro Abs: 5.2 K/uL (ref 1.5–6.5)
Neutrophils Relative %: 7 %
Platelet Count: 95 K/uL — ABNORMAL LOW (ref 145–400)
RBC: 3.36 MIL/uL — ABNORMAL LOW (ref 3.70–5.45)
RDW: 14.3 % (ref 11.2–14.5)
WBC Count: 79.4 K/uL (ref 3.9–10.3)

## 2018-04-06 LAB — RETICULOCYTES
RBC.: 3.36 MIL/uL — ABNORMAL LOW (ref 3.70–5.45)
RETIC COUNT ABSOLUTE: 73.9 10*3/uL (ref 33.7–90.7)
RETIC CT PCT: 2.2 % — AB (ref 0.7–2.1)

## 2018-04-06 LAB — LACTATE DEHYDROGENASE: LDH: 195 U/L (ref 125–245)

## 2018-04-12 ENCOUNTER — Telehealth: Payer: Self-pay

## 2018-04-12 ENCOUNTER — Other Ambulatory Visit: Payer: Self-pay | Admitting: Physician Assistant

## 2018-04-12 NOTE — Telephone Encounter (Signed)
Pt reports she is having a spinal injection Tuesday and the Dr that is doing the surgery suggested that she stop the LEXAPRO due to possible bleeding due to this med. Please advise.   Per provider LEXAPRO has not caused bleeding that she is aware. Not use to this being a issue for pt's but she can come off the medication short term & then get back on it.  Per [name] she will stop the LEXAPRO at this time due to her history of easily bleeding she does not want to take that chance so she will stop and then restart after surgery. Pt voiced understanding of the information & hung up.

## 2018-04-16 NOTE — Progress Notes (Signed)
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 (patient reports -planned for August 2017) 13q deletion does pre-dispose her to recurrent SCC  Current Treatment: observation  Previous treatment: IVIG for several months last winter to reduce recurrent respiratory infections (Patient notes that this helped) Has not required definitive treatment for CLL at this time and has been reluctant to consider treatment recently.  INTERVAL HISTORY:  Rebecca Bond is here for her scheduled f/u for CLL. The patient's last visit with [place] was on 01/18/18. The pt reports that she is doing well overall.   The pt reports that she received an epidural for a herniated disk yesterday, and presented to the ED for syncope. She received IV fluids and was worked up with an EKG and blood work. She notes that she feels tired today.   Of note since the patient's last visit, pt has had CT Chest completed on 03/07/18 with results revealing 1. There is a new small area of subpleural consolidation overlying the posterior right lower lobe. Nonspecific. Favor area of infection/inflammation. Consider followup imaging in 3 months following appropriate therapy to confirm resolution. 2. Interval improvement in previous axillary adenopathy. 3. New right paratracheal and subcarinal borderline adenopathy. 4.  Aortic Atherosclerosis (ICD10-I70.0). 5. Lad coronary artery calcifications.  Lab results today (04/17/18) of CBC reveal all values WNL except for WBC at 59.1k, RBC at 3.79, Hgb at 11.3, HCT at 34.5, Platelets at 101k, Lymphs Abs  at 53.2k. CMP, and Reticulocytes 04/06/18  is as follows: all values are WNL except for Sodium at 135, Retic ct pct at 2.2%. LDH 04/06/18 was WNL at 195.   On review of systems, pt reports mild headache, some fatigue, seasonal allergies, intermittent diarrhea, and denies fevers, chills, night sweats, noticing any new lumps or bumps, abdominal pains, and any other symptoms.   REVIEW OF SYSTEMS:   A 10+ POINT REVIEW OF SYSTEMS WAS OBTAINED including neurology, dermatology, psychiatry, cardiac, respiratory, lymph, extremities, GI, GU, Musculoskeletal, constitutional, breasts, reproductive, HEENT.  All pertinent positives are noted in the HPI.  All others are negative.   . Past Medical History:  Diagnosis Date  . Anemia   . Anxiety   . Arthritis   . CLL (chronic lymphocytic leukemia) ([place]) dx'd ~ 2015  . Depression   . GERD (gastroesophageal reflux disease)   . Headache    "sinus headaches"  . History of kidney stones   . Hypertension   . OSA (obstructive sleep apnea)   . Pneumonia   . Prediabetes   . Recurrent sinus infections 08/02/2012    . Past Surgical History:  Procedure Laterality Date  . ABDOMINAL HYSTERECTOMY  1980's   "endometrosis"  . APPENDECTOMY    . BREAST SURGERY    . CATARACT EXTRACTION, BILATERAL    . CHOLECYSTECTOMY  1985  . FUNCTIONAL ENDOSCOPIC SINUS SURGERY  1990's   "cause I kept having sinus infections"  . immunoglobulin treatment  2017  . LAPAROSCOPIC APPENDECTOMY N/A 06/04/2014   Procedure: APPENDECTOMY LAPAROSCOPIC;  Surgeon: Zenovia Jarred, MD;  Location: [facility];  Service: General;  Laterality: N/A;  . LUMBAR LAMINECTOMY/DECOMPRESSION MICRODISCECTOMY Left 11/01/2017   Procedure: Left Lumbar Four-Five Extraforaminal Microdiscectomy;  Surgeon: Eustace Moore, MD;  Location: [facility];  Service: Neurosurgery;  Laterality: Left;  . LYMPH NODE BIOPSY     "determined I had CLL"  . SHOULDER ARTHROSCOPY Right   . TEMPOROMANDIBULAR JOINT ARTHROPLASTY  1980's  .  TONSILLECTOMY AND ADENOIDECTOMY  1950's    . Social History   Tobacco Use  . Smoking status: Former Smoker    Packs/day: 0.75    Years: 4.00    Pack years: 3.00    Types: Cigarettes    Last attempt to quit: 12/05/1968    Years since quitting: 49.4  . Smokeless tobacco: Never Used  Substance Use Topics  . Alcohol use: Yes    Alcohol/week: 4.2 oz    Types: 7 Glasses of wine per week  . Drug use: No    ALLERGIES:  is allergic to hyzaar [losartan potassium-hctz]; sudafed [pseudoephedrine hcl]; levaquin [levofloxacin in d5w]; acyclovir and related; biaxin [clarithromycin]; celexa [citalopram]; flexeril [cyclobenzaprine]; fosamax [alendronate sodium]; iohexol; losartan; meloxicam; norvasc [amlodipine]; pseudoephedrine; and zoloft [sertraline hcl].  MEDICATIONS:  Current Outpatient Medications  Medication Sig Dispense Refill  . ALPRAZolam (XANAX) 1 MG tablet TAKE 1/2 TO 1 TABLET BY MOUTH THREE TIMES A DAY AS NEEDED 90 tablet 0  . Ascorbic Acid (VITAMIN C) 1000 MG tablet Take 1,500 mg by mouth daily.     . bisoprolol-hydrochlorothiazide (ZIAC) 10-6.25 MG tablet TAKE ONE TABLET BY MOUTH DAILY 90 tablet 1  . Cholecalciferol (D 2000) 2000 units TABS Take 10,000 Units by mouth daily.     . DiphenhydrAMINE HCl (BENADRYL ALLERGY PO) Take by mouth. Take 2 tablets daily    . escitalopram (LEXAPRO) 10 MG tablet TAKE ONE TABLET BY MOUTH DAILY FOR MOOD 90 tablet 1  . ezetimibe (ZETIA) 10 MG tablet TAKE ONE TABLET BY MOUTH DAILY 90 tablet 1  . fexofenadine (ALLEGRA) 180 MG tablet Take 1 tablet (180 mg total) by mouth daily. Take PRN for allergies. 30 tablet 6  . furosemide (LASIX) 40 MG tablet Take 20 mg daily as needed by mouth for fluid or edema.     Vladimir Faster Glycol-Propyl Glycol (SYSTANE OP) Apply 2 drops daily to eye.    . predniSONE (DELTASONE) 20 MG tablet 1 tab 3 x day for 3 days, then 1 tab 2 x day for 3 days, then 1 tab 1 x day for 5 days 20 tablet 0  . Probiotic Product (PROBIOTIC PO)  Take 1 tablet daily by mouth.    . triamcinolone (NASACORT) 55 MCG/ACT AERO nasal inhaler Place 2 sprays daily as needed into the nose.    . Turmeric 400 MG CAPS Take 800 mg by mouth 2 (two) times daily.     No current facility-administered medications for this visit.     PHYSICAL EXAMINATION: ECOG PERFORMANCE STATUS: 1 - Symptomatic but completely ambulatory  . Vitals:   04/18/18 1543  BP: (!) 153/65  Pulse: (!) 59  Resp: 18  Temp: 98.2 F (36.8 C)  SpO2: 100%    Filed Weights   04/18/18 1543  Weight: 209 lb (94.8 kg)   .Body mass index is 37.02 kg/m.  GENERAL:alert, in no acute distress and comfortable SKIN: no acute rashes, no significant lesions EYES: conjunctiva are pink and non-injected, sclera anicteric OROPHARYNX: MMM, no exudates, no oropharyngeal erythema or ulceration NECK: supple, no JVD LYMPH:  no palpable lymphadenopathy in the cervical, axillary or inguinal regions  LUNGS: clear to auscultation b/l with normal respiratory effort HEART: regular rate & rhythm ABDOMEN:  normoactive bowel sounds , non tender, not distended. Extremity: no pedal edema PSYCH: alert & oriented x 3 with fluent speech NEURO: no focal motor/sensory deficits   LABORATORY DATA:   .Rebecca Bond CBC Latest Ref Rng & Units 04/17/2018 04/17/2018 04/06/2018  WBC 4.0 - 10.5 K/uL - 59.1(HH) 79.4(HH)  Hemoglobin 12.0 - 15.0 g/dL 11.2(L) 11.3(L) 10.2(L)  Hematocrit 36.0 - 46.0 % 33.0(L) 34.5(L) 31.7(L)  Platelets 150 - 400 K/uL - 101(L) 95(L)   . CBC    Component Value Date/Time   WBC 59.1 (HH) 04/17/2018 1616   RBC 3.79 (L) 04/17/2018 1616   HGB 11.2 (L) 04/17/2018 1622   HGB 10.2 (L) 04/06/2018 1555   HGB 11.2 (L) 11/08/2017 1418   HCT 33.0 (L) 04/17/2018 1622   HCT 34.8 11/08/2017 1418   PLT 101 (L) 04/17/2018 1616   PLT 95 (L) 04/06/2018 1555   PLT 140 (L) 11/08/2017 1418   MCV 91.0 04/17/2018 1616   MCV 96.4 11/08/2017 1418   MCH 29.8 04/17/2018 1616   MCHC 32.8 04/17/2018 1616   RDW  14.3 04/17/2018 1616   RDW 14.8 (H) 11/08/2017 1418   LYMPHSABS 53.2 (H) 04/17/2018 1616   LYMPHSABS 87.0 (H) 11/08/2017 1418   MONOABS 0.6 04/17/2018 1616   MONOABS 1.5 (H) 11/08/2017 1418   EOSABS 0.6 04/17/2018 1616   EOSABS 0.6 (H) 11/08/2017 1418   BASOSABS 0.0 04/17/2018 1616   BASOSABS 0.2 (H) 11/08/2017 1418      CMP Latest Ref Rng & Units 04/17/2018 04/06/2018 02/14/2018  Glucose 65 - 99 mg/dL 104(H) 94 99  BUN 6 - 20 mg/dL 14 18 15   Creatinine 0.44 - 1.00 mg/dL 0.70 0.77 0.86  Sodium 135 - 145 mmol/L 135 135(L) 135(L)  Potassium 3.5 - 5.1 mmol/L 5.6(H) 3.7 4.5  Chloride 101 - 111 mmol/L 101 102 101  CO2 22 - 29 mmol/L - 26 25  Calcium 8.4 - 10.4 mg/dL - 9.7 10.1  Total Protein 6.4 - 8.3 g/dL - 6.5 6.7  Total Bilirubin 0.2 - 1.2 mg/dL - 0.4 0.6  Alkaline Phos 40 - 150 U/L - 66 64  AST 5 - 34 U/L - 24 18  ALT 0 - 55 U/L - 13 12      RADIOGRAPHIC STUDIES: I have personally reviewed the radiological images as listed and agreed with the findings in the report.  CT ABDOMEN AND PELVIS WITHOUT CONTRAST   IMPRESSION: 1. No acute abnormalities within the abdomen or pelvis. 2. Mild splenomegaly and several prominent to mildly enlarged pelvic and inguinal lymph nodes. These findings are similar to the prior CT. 3. Small nonobstructing stone in the lower pole the right kidney. No ureteral stones or obstructive uropathy.   Electronically Signed   By: Lajean Manes M.D.   On: 11/14/2016 16:39    ASSESSMENT & PLAN:   72 y.o.  caucasian female with   1.Rai Stage 2 previously - now Stage IV CLL with lymphocytosis, LNadenopathy and mild splenomegaly with anemia and thrombocytopenia. -patient has previously and continues to desire holding off CLL directed treatment as long as possible.   Lab Results  Component Value Date   LDH 195 04/06/2018  PLAN -Discussed pt labwork today, 04/17/18; WBC have decreased to 59.1k, Hgb improved to 11.3, Platelets improved to 101k.  Blood counts and chemistries are otherwise stable.  -Given the continued decrease in her WBC, we will see pt  back in 3 months  -no acute indication for treatment of [name] at this time.  2.  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 Has been taking good infection prevention precautions. CLL initially diagnosed 2009 with 13q deletion.. Only intervention thus far has been intermittent IVIG, clinically very helpful with decrease in respiratory infections.  Last imaging was CT AP 02-2015. --showed no evidence of Splenomegaly. Negative Hep B serology 2015  No overt new constitutional symptoms except mild unchanged fatigue. Has had significant recurrent headaches with IVIG - now held.  3. Mild Anemia Hgb hemoglobin stable in the 10.5-11 range. Hgb increased to 11.3 today 04/18/18  3. Moderate thrombocytopenia PLT count at 93k has recently been in the 80-90k range. Did receive platelets pre-operatively Platelets are improved to 101k  4. H/o  Recurrent cutaneous SCC s/p Mohs surgery for SCC of the nosehealed. No issues with infection-   her 13q deletion places her at risk for recurrent SCC. -continue close f/u with dermatologist for evaluation and management of non melanoma skin cancers that can be increased in patient with CLL with 13q deletion.   RTC with Dr Irene Limbo in 3 months with labs    . The total time spent in the appointment was 20 minutes and more than 50% was on counseling and direct patient cares.    Sullivan Lone MD [place] AAHIVMS [facility] Hematology/Oncology Physician [place]  (Office):       [phone] (Work cell):  [phone] (Fax):           [phone]   This document serves as a record of services personally performed by Sullivan Lone, MD. It was created on his behalf by Baldwin [place], a trained medical scribe. The creation of this record is based on the scribe's personal observations and the provider's statements to them.   .I have reviewed the above  documentation for accuracy and completeness, and I agree with the above. Brunetta Genera MD MS

## 2018-04-17 ENCOUNTER — Emergency Department (HOSPITAL_COMMUNITY)
Admission: EM | Admit: 2018-04-17 | Discharge: 2018-04-17 | Disposition: A | Discharge status: Home / Self Care | Payer: 59 | Attending: Emergency Medicine | Admitting: Emergency Medicine

## 2018-04-17 ENCOUNTER — Emergency Department (HOSPITAL_COMMUNITY): Payer: 59

## 2018-04-17 DIAGNOSIS — Z87891 Personal history of nicotine dependence: Secondary | ICD-10-CM | POA: Insufficient documentation

## 2018-04-17 DIAGNOSIS — Z79899 Other long term (current) drug therapy: Secondary | ICD-10-CM | POA: Diagnosis not present

## 2018-04-17 DIAGNOSIS — R55 Syncope and collapse: Secondary | ICD-10-CM

## 2018-04-17 DIAGNOSIS — I10 Essential (primary) hypertension: Secondary | ICD-10-CM | POA: Diagnosis not present

## 2018-04-17 LAB — CBC WITH DIFFERENTIAL/PLATELET
Band Neutrophils: 0 %
Basophils Absolute: 0 10*3/uL (ref 0.0–0.1)
Basophils Relative: 0 %
Blasts: 0 %
Eosinophils Absolute: 0.6 10*3/uL (ref 0.0–0.7)
Eosinophils Relative: 1 %
HEMATOCRIT: 34.5 % — AB (ref 36.0–46.0)
HEMOGLOBIN: 11.3 g/dL — AB (ref 12.0–15.0)
LYMPHS PCT: 90 %
Lymphs Abs: 53.2 10*3/uL — ABNORMAL HIGH (ref 0.7–4.0)
MCH: 29.8 pg (ref 26.0–34.0)
MCHC: 32.8 g/dL (ref 30.0–36.0)
MCV: 91 fL (ref 78.0–100.0)
Metamyelocytes Relative: 0 %
Monocytes Absolute: 0.6 10*3/uL (ref 0.1–1.0)
Monocytes Relative: 1 %
Myelocytes: 0 %
NEUTROS PCT: 4 %
NRBC: 0 /100{WBCs}
Neutro Abs: 2.4 10*3/uL (ref 1.7–7.7)
OTHER: 4 %
PROMYELOCYTES RELATIVE: 0 %
Platelets: 101 10*3/uL — ABNORMAL LOW (ref 150–400)
RBC: 3.79 MIL/uL — ABNORMAL LOW (ref 3.87–5.11)
RDW: 14.3 % (ref 11.5–15.5)
SMEAR REVIEW: DECREASED
WBC: 59.1 10*3/uL (ref 4.0–10.5)

## 2018-04-17 LAB — I-STAT CHEM 8, ED
BUN: 14 mg/dL (ref 6–20)
CALCIUM ION: 1.11 mmol/L — AB (ref 1.15–1.40)
CHLORIDE: 101 mmol/L (ref 101–111)
Creatinine, Ser: 0.7 mg/dL (ref 0.44–1.00)
GLUCOSE: 104 mg/dL — AB (ref 65–99)
HCT: 33 % — ABNORMAL LOW (ref 36.0–46.0)
Hemoglobin: 11.2 g/dL — ABNORMAL LOW (ref 12.0–15.0)
Potassium: 5.6 mmol/L — ABNORMAL HIGH (ref 3.5–5.1)
Sodium: 135 mmol/L (ref 135–145)
TCO2: 28 mmol/L (ref 22–32)

## 2018-04-17 LAB — I-STAT TROPONIN, ED: Troponin i, poc: 0.01 ng/mL (ref 0.00–0.08)

## 2018-04-17 MED ORDER — SODIUM CHLORIDE 0.9 % IV BOLUS
1000.0000 mL | Freq: Once | INTRAVENOUS | Status: AC
  Administered 2018-04-17: 1000 mL via INTRAVENOUS

## 2018-04-17 NOTE — ED Provider Notes (Signed)
Assume care from Dr. Ashok Cordia at 1500, please see their documentation for complete history and physical.  I have reviewed documentation and agree with previous providers assessment.  Patient is a 72 y.o. with past medical history as below.   Plan is to check basic labs. Pt to be discharged if no concerning values found.   Laboratory work-up within normal limits with the exception of elevated white count which is consistent with patient's previous lymphoma.  Patient appropriate for discharge at this time. Pt given appropriate f/u and return precautions. Pt voiced understanding and is agreeable to discharge at this time.    Past Medical History:  Diagnosis Date  . Anemia   . Anxiety   . Arthritis   . CLL (chronic lymphocytic leukemia) ([place]) dx'd ~ 2015  . Depression   . GERD (gastroesophageal reflux disease)   . Headache    "sinus headaches"  . History of kidney stones   . Hypertension   . OSA (obstructive sleep apnea)   . Pneumonia   . Prediabetes   . Recurrent sinus infections 08/02/2012       Chapman Moss, MD 04/18/18 0011    Hayden Rasmussen, MD 04/20/18 1057

## 2018-04-17 NOTE — ED Provider Notes (Signed)
[facility] EMERGENCY DEPARTMENT Provider Note   CSN: [id number] Arrival date & time: 04/17/18  1416     History   Chief Complaint Chief Complaint  Patient presents with  . Loss of Consciousness    HPI Rebecca Bond is a 72 y.o. female.  The history is provided by the patient and a caregiver.  Loss of Consciousness   This is a new problem. The current episode started less than 1 hour ago. The problem occurs rarely. The problem has been resolved. She lost consciousness for a period of less than one minute. Associated with: Pt had just undergone epidural injection and sat up from a prone position when she had blurry vision, lightheadedness, and lost consciousness briefly. Associated symptoms include back pain (chronic), light-headedness and visual change. Pertinent negatives include abdominal pain, chest pain, diaphoresis, dizziness, fever, focal weakness, headaches, nausea, palpitations, seizures, slurred speech, vomiting and weakness. She has tried nothing for the symptoms.    Past Medical History:  Diagnosis Date  . Anemia   . Anxiety   . Arthritis   . 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 Depression   . GERD (gastroesophageal reflux disease)   . Headache    "sinus headaches"  . History of kidney stones   . Hypertension   . OSA (obstructive sleep apnea)   . Pneumonia   . Prediabetes   . Recurrent sinus infections 08/02/2012    Patient Active Problem List   Diagnosis Date Noted  . S/P lumbar laminectomy 11/01/2017  . Thrombocytopenia ([place]) 05/02/2016  . Immunocompromised ([place]) 10/10/2015  . BMI 37.85,   adult 09/23/2015  . Environmental and seasonal allergies 08/15/2015  . Mixed hyperlipidemia 06/01/2015  . Medication management 06/01/2015  . 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 Vitamin D deficiency 09/30/2014  . HTN (hypertension) 01/08/2014  . Prediabetes   . Depression, major, in remission ([place])   . GERD  (gastroesophageal reflux disease)   . OSA (obstructive sleep apnea)   . Angioedema secondary to ACE/ARB 12/25/2013  . Morbid obesity (BMI 37.95) 12/17/2013  . Hepatitis A 08/02/2012  . Recurrent sinus infections 08/02/2012  . CLL (chronic lymphocytic leukemia) ([place]) 11/22/2011    Past Surgical History:  Procedure Laterality Date  . ABDOMINAL HYSTERECTOMY  1980's   "endometrosis"  . APPENDECTOMY    . BREAST SURGERY    . CATARACT EXTRACTION, BILATERAL    . CHOLECYSTECTOMY  1985  . FUNCTIONAL ENDOSCOPIC SINUS SURGERY  1990's   "cause I kept having sinus infections"  . immunoglobulin treatment  2017  . LAPAROSCOPIC APPENDECTOMY N/A 06/04/2014   Procedure: APPENDECTOMY LAPAROSCOPIC;  Surgeon: Zenovia Jarred, MD;  Location: [facility];  Service: General;  Laterality: N/A;  . LUMBAR LAMINECTOMY/DECOMPRESSION MICRODISCECTOMY Left 11/01/2017   Procedure: Left Lumbar Four-Five Extraforaminal Microdiscectomy;  Surgeon: Eustace Moore, MD;  Location: [facility];  Service: Neurosurgery;  Laterality: Left;  . LYMPH NODE BIOPSY     "determined I had CLL"  . SHOULDER ARTHROSCOPY Right   . TEMPOROMANDIBULAR JOINT ARTHROPLASTY  1980's  . TONSILLECTOMY AND ADENOIDECTOMY  1950's     OB History   None      Home Medications    Prior to Admission medications   Medication Sig Start Date End Date Taking? Authorizing Provider  ALPRAZolam Duanne Moron) 1 MG tablet TAKE 1/2 TO 1 TABLET BY MOUTH THREE TIMES A DAY AS NEEDED 01/31/18   Vicie Mutters, PA-C  Ascorbic Acid (VITAMIN C) 1000 MG tablet Take 1,500  mg by mouth daily.     [provider]  bisoprolol-hydrochlorothiazide [facility]) 10-6.25 MG tablet TAKE ONE TABLET BY MOUTH DAILY 01/27/18   [name], MD  Cholecalciferol (D 2000) 2000 units TABS Take 10,000 Units by mouth daily.     [provider]  DiphenhydrAMINE HCl (BENADRYL ALLERGY PO) Take by mouth. Take 2 tablets daily    [provider]  escitalopram (LEXAPRO) 10 MG tablet  TAKE ONE TABLET BY MOUTH DAILY FOR MOOD 10/23/17   [name], MD  ezetimibe (ZETIA) 10 MG tablet TAKE ONE TABLET BY MOUTH DAILY 10/23/17   [name], MD  fexofenadine (ALLEGRA) 180 MG tablet Take 1 tablet (180 mg total) by mouth daily. Take PRN for allergies. 06/01/15   [name], MD  furosemide (LASIX) 40 MG tablet Take 20 mg daily as needed by mouth for fluid or edema.     [provider]  Polyethyl Glycol-Propyl Glycol (SYSTANE OP) Apply 2 drops daily to eye.    [provider]  predniSONE (DELTASONE) 20 MG tablet 1 tab 3 x day for 3 days, then 1 tab 2 x day for 3 days, then 1 tab 1 x day for 5 days 02/09/18   [name], MD  Probiotic Product (PROBIOTIC PO) Take 1 tablet daily by mouth.    [provider]  triamcinolone (NASACORT) 55 MCG/ACT AERO nasal inhaler Place 2 sprays daily as needed into the nose.    [provider]  Turmeric 400 MG CAPS Take 800 mg by mouth 2 (two) times daily.    [provider]    Family History Family History  Problem Relation Age of Onset  . Parkinson's disease Mother   . Hypertension Mother   . Hypertension Maternal Grandmother   . Heart attack Maternal Grandmother        Mild    Social History Social History   Tobacco Use  . Smoking status: Former Smoker    Packs/day: 0.75    Years: 4.00    Pack years: 3.00    Types: Cigarettes    Last attempt to quit: 12/05/1968    Years since quitting: 49.3  . Smokeless tobacco: Never Used  Substance Use Topics  . Alcohol use: Yes    Alcohol/week: 4.2 oz    Types: 7 Glasses of wine per week  . Drug use: No     Allergies   Hyzaar [losartan potassium-hctz]; Sudafed [pseudoephedrine hcl]; Levaquin [levofloxacin in d5w]; Acyclovir and related; Biaxin [clarithromycin]; Celexa [citalopram]; Flexeril [cyclobenzaprine]; Fosamax [alendronate sodium]; Iohexol; Losartan; Meloxicam; Norvasc [amlodipine]; Pseudoephedrine; and Zoloft [sertraline  hcl]   Review of Systems Review of Systems  Constitutional: Negative for chills, diaphoresis and fever.  HENT: Negative for ear pain and sore throat.   Eyes: Negative for pain and visual disturbance.  Respiratory: Negative for cough and shortness of breath.   Cardiovascular: Positive for syncope. Negative for chest pain and palpitations.  Gastrointestinal: Negative for abdominal pain, nausea and vomiting.  Genitourinary: Negative for dysuria and hematuria.  Musculoskeletal: Positive for back pain (chronic). Negative for arthralgias.  Skin: Negative for color change and rash.  Neurological: Positive for syncope and light-headedness. Negative for dizziness, focal weakness, seizures, weakness and headaches.  All other systems reviewed and are negative.    Physical Exam Updated Vital Signs BP (!) 187/90 (BP Location: Right Arm)   Pulse 60   Resp 16   SpO2 100%   Physical Exam  Constitutional: She is oriented to person, place, and time.  She appears well-developed and well-nourished. No distress.  HENT:  Head: Normocephalic and atraumatic.  Eyes: Conjunctivae are normal.  Neck: Neck supple.  Cardiovascular: Normal rate and regular rhythm.  No murmur heard. Pulmonary/Chest: Effort normal and breath sounds normal. No respiratory distress.  Abdominal: Soft. There is no tenderness.  Musculoskeletal: She exhibits no edema.  Neurological: She is alert and oriented to person, place, and time. She displays normal reflexes. No cranial nerve deficit or sensory deficit. She exhibits normal muscle tone. Coordination normal.  Strength 5 out of 5 in all extreme knees.  Normal sensation to light touch intact throughout.  Coordination normal.  CN II through XII normal.  Skin: Skin is warm and dry.  Psychiatric: She has a normal mood and affect.  Nursing note and vitals reviewed.    ED Treatments / Results  Labs (all labs ordered are listed, but only abnormal results are displayed) Labs  Reviewed  CBC WITH DIFFERENTIAL/PLATELET  I-STAT CHEM 8, ED  I-STAT TROPONIN, ED    EKG None  Radiology No results found.  Procedures Procedures (including critical care time)  Medications Ordered in ED Medications  sodium chloride 0.9 % bolus 1,000 mL (has no administration in time range)     Initial Impression / Assessment and Plan / ED Course  I have reviewed the triage vital signs and the nursing notes.  Pertinent labs & imaging results that were available during my care of the patient were reviewed by me and considered in my medical decision making (see chart for details).    Patient is a 72 year old female with history as above who presents after having a syncopal episode.  She was having an epidural placed in her back for her chronic pain.  The procedure was completed and after the patient had been lying prone for about 30 minutes, she sat up.  At that time, she felt lightheaded and her vision blurred.  She then lost consciousness very briefly.  She is now fully alert and oriented.  She has a normal neurologic exam as above.  She still feels a little lightheaded, but feeling much better.  She denies any chest pain or palpitations throughout this episode.  She felt in her normal state of health this morning.  She did have a couple episodes of diarrhea yesterday and the day before.  However, she has been drinking a lot of fluid yesterday and today.  She does not feel that she is dehydrated.  I suspect her syncope was orthostatic vs vasovagal following her procedure and sitting up abruptly, however given her age we will obtain EKG, CXR, labs.  These studies are pending and pt was signed out to Dr. Olen Pel around 3pm awaiting test results. If labs, EKG, and CXR are within her baseline, I anticipate discharge home.  Final Clinical Impressions(s) / ED Diagnoses   Final diagnoses:  None    ED Discharge Orders    None       Clifton James, MD 04/18/18 1634     Carmin Muskrat, MD 04/18/18 2202

## 2018-04-17 NOTE — ED Triage Notes (Signed)
Pt arrives from [facility] neuro having have a syncope event while getting an epidural in her back just PTA. Pt is now alert and ox4. Husband at bedside. Pt request no IV from ems due to fear of "passing out".

## 2018-04-18 ENCOUNTER — Inpatient Hospital Stay: Payer: 59 | Admitting: Hematology

## 2018-04-18 ENCOUNTER — Other Ambulatory Visit: Payer: Medicare Other

## 2018-04-18 ENCOUNTER — Telehealth: Payer: Self-pay | Admitting: Hematology

## 2018-04-18 VITALS — BP 153/65 | HR 59 | Temp 98.2°F | Resp 18 | Ht 63.0 in | Wt 209.0 lb

## 2018-04-18 DIAGNOSIS — Z881 Allergy status to other antibiotic agents status: Secondary | ICD-10-CM

## 2018-04-18 DIAGNOSIS — I10 Essential (primary) hypertension: Secondary | ICD-10-CM | POA: Diagnosis not present

## 2018-04-18 DIAGNOSIS — R161 Splenomegaly, not elsewhere classified: Secondary | ICD-10-CM | POA: Diagnosis not present

## 2018-04-18 DIAGNOSIS — R7303 Prediabetes: Secondary | ICD-10-CM

## 2018-04-18 DIAGNOSIS — Z9889 Other specified postprocedural states: Secondary | ICD-10-CM

## 2018-04-18 DIAGNOSIS — I2510 Atherosclerotic heart disease of native coronary artery without angina pectoris: Secondary | ICD-10-CM | POA: Diagnosis not present

## 2018-04-18 DIAGNOSIS — C9110 Chronic lymphocytic leukemia of B-cell type not having achieved remission: Secondary | ICD-10-CM

## 2018-04-18 DIAGNOSIS — Z87891 Personal history of nicotine dependence: Secondary | ICD-10-CM

## 2018-04-18 DIAGNOSIS — R51 Headache: Secondary | ICD-10-CM | POA: Diagnosis not present

## 2018-04-18 DIAGNOSIS — Z79899 Other long term (current) drug therapy: Secondary | ICD-10-CM | POA: Diagnosis not present

## 2018-04-18 DIAGNOSIS — Z888 Allergy status to other drugs, medicaments and biological substances status: Secondary | ICD-10-CM

## 2018-04-18 DIAGNOSIS — I700 Atherosclerosis of aorta: Secondary | ICD-10-CM | POA: Diagnosis not present

## 2018-04-18 DIAGNOSIS — G4733 Obstructive sleep apnea (adult) (pediatric): Secondary | ICD-10-CM

## 2018-04-18 DIAGNOSIS — K219 Gastro-esophageal reflux disease without esophagitis: Secondary | ICD-10-CM

## 2018-04-18 DIAGNOSIS — R590 Localized enlarged lymph nodes: Secondary | ICD-10-CM

## 2018-04-18 NOTE — Telephone Encounter (Signed)
Appointments scheduled AVS/Calendar printed per 5/15 los °

## 2018-04-18 NOTE — Progress Notes (Signed)
Assessment and Plan:  Diagnoses and all orders for this visit:  Vasovagal syncope Workup from ER reviewed with patient No further episodes or concerning symptoms Patient reassured of benign history and exam.  Notify office of any further episodes or concerns   Sinusitis Will treat due to hx of severe infections and CLL Suggested symptomatic OTC remedies. Nasal saline spray for congestion. Nasal steroids, allergy pill, oral steroids Will send in zpak Follow up as needed.  Further disposition pending results of labs. Discussed med's effects and SE's.   Over 15 minutes of exam, counseling, chart review, and critical decision making was performed.   Future Appointments  Date Time Provider [place]  07/18/2018  2:30 PM [facility] None  07/18/2018  3:00 PM Brunetta Genera, MD [facility] None  08/22/2018  3:00 PM [name], MD [facility] None  08/28/2018 11:40 AM Troy Sine, MD [facility]    ------------------------------------------------------------------------------------------------------------------   HPI BP 124/70   Pulse (!) 57   Temp (!) 97.3 F (36.3 C)   Ht 5\' 3"  (1.6 m)   Wt 206 lb 12.8 oz (93.8 kg)   SpO2 96%   BMI 36.63 kg/m   72 y.o.female presents for follow up after presenting to ER on 04/17/2018 after she had a syncopal episode after receiving an epidural injection while at Dr. Lauris Poag office for herniated disc. She reported blurry vision, lightheadedness, with brief loss of consciousness. Pertinent negatives included abdominal pain, chest pain, diaphoresis, dizziness, fever, focal weakness, headaches, nausea, palpitations, seizures, slurred speech, vomiting and weakness. She had a normal neuro exam in ER, and received a workup including EKG, CXR, CBC, CMP, troponin which were unremarkable other than chronic changes of CBC consistent with her baseline with CLL. Her hydration status was good, and discharged with  diagnosis of vasovagal syncope.   She presents today doing well since discharge, reports symptoms were significantly imrpved by the time she presented to ER and denies any further symptoms or episodes.   She does endorse sinus pressure and teeth aching today ongoing since last week; she is on daily allergy medication, saline irrigations and flonase without significant improvement. She denies fever/chills, headache or cough.    Past Medical History:  Diagnosis Date  . Anemia   . Anxiety   . Arthritis   . CLL (chronic lymphocytic leukemia) ([place]) dx'd ~ 2015  . Depression   . GERD (gastroesophageal reflux disease)   . Headache    "sinus headaches"  . History of kidney stones   . Hypertension   . OSA (obstructive sleep apnea)   . Pneumonia   . Prediabetes   . Recurrent sinus infections 08/02/2012     Allergies  Allergen Reactions  . Hyzaar [Losartan Potassium-Hctz] Other (See Comments)    "messed up my sodium counts" swells up lips.  [name] [Pseudoephedrine Hcl] Palpitations  . Levaquin [Levofloxacin In D5w] Diarrhea and Nausea Only  . Acyclovir And Related   . Biaxin [Clarithromycin]     GI Upset  . Celexa [Citalopram]   . Flexeril [Cyclobenzaprine]     "zombie-like" feeling  . Fosamax [Alendronate Sodium]     GI upset  . Iohexol Hives     Code: HIVES, Desc: pt states she broke out in hive 20 yrs ago from IV contrast.     . Losartan     Angioedema  . Meloxicam     GI upset  . Norvasc [Amlodipine] Swelling  . Pseudoephedrine     Palpitations  .  Zoloft [Sertraline Hcl] Other (See Comments)    Has no emotions at all     Current Outpatient Medications on File Prior to Visit  Medication Sig  . ALPRAZolam (XANAX) 1 MG tablet TAKE 1/2 TO 1 TABLET BY MOUTH THREE TIMES A DAY AS NEEDED  . Ascorbic Acid (VITAMIN C) 1000 MG tablet Take 1,500 mg by mouth daily.   . bisoprolol-hydrochlorothiazide (ZIAC) 10-6.25 MG tablet TAKE ONE TABLET BY MOUTH DAILY  . Cholecalciferol (D  2000) 2000 units TABS Take 10,000 Units by mouth daily.   . DiphenhydrAMINE HCl (BENADRYL ALLERGY PO) Take by mouth. Take 2 tablets daily  . escitalopram (LEXAPRO) 10 MG tablet TAKE ONE TABLET BY MOUTH DAILY FOR MOOD  . ezetimibe (ZETIA) 10 MG tablet TAKE ONE TABLET BY MOUTH DAILY  . fexofenadine (ALLEGRA) 180 MG tablet Take 1 tablet (180 mg total) by mouth daily. Take PRN for allergies.  . furosemide (LASIX) 40 MG tablet Take 20 mg daily as needed by mouth for fluid or edema.   Vladimir Faster Glycol-Propyl Glycol (SYSTANE OP) Apply 2 drops daily to eye.  . predniSONE (DELTASONE) 20 MG tablet 1 tab 3 x day for 3 days, then 1 tab 2 x day for 3 days, then 1 tab 1 x day for 5 days  . Probiotic Product (PROBIOTIC PO) Take 1 tablet daily by mouth.  . triamcinolone (NASACORT) 55 MCG/ACT AERO nasal inhaler Place 2 sprays daily as needed into the nose.  . Turmeric 400 MG CAPS Take 800 mg by mouth 2 (two) times daily.   No current facility-administered medications on file prior to visit.     ROS: all negative except above.   Physical Exam:  BP 124/70   Pulse (!) 57   Temp (!) 97.3 F (36.3 C)   Ht 5\' 3"  (1.6 m)   Wt 206 lb 12.8 oz (93.8 kg)   SpO2 96%   BMI 36.63 kg/m   General Appearance: Well nourished, in no apparent distress. Eyes: PERRLA, EOMs, conjunctiva no swelling or erythema Sinuses: She does have left maxillary tenderness without right Frontal/maxillary tenderness ENT/Mouth: Ext aud canals clear, TMs without erythema, bulging. No erythema, swelling, or exudate on post pharynx.  Tonsils not swollen or erythematous. Hearing normal.  Neck: Supple, thyroid normal.  Respiratory: Respiratory effort normal, BS equal bilaterally without rales, rhonchi, wheezing or stridor.  Cardio: RRR with no MRGs. Brisk peripheral pulses without edema.  Abdomen: Soft, + BS.  Non tender, no guarding, rebound, hernias, masses. Lymphatics: Non tender without lymphadenopathy.  Musculoskeletal: Full ROM,  Symmetrical strength upper and lower extremities, normal gait.  Skin: Warm, dry without rashes, lesions, ecchymosis.  Neuro: Cranial nerves intact. Normal muscle tone, no cerebellar symptoms. Sensation intact.  Psych: Awake and oriented X 3, normal affect, Insight and Judgment appropriate.     Izora Ribas, NP 3:23 PM [facility]

## 2018-04-19 ENCOUNTER — Encounter: Payer: Self-pay | Admitting: Adult Health

## 2018-04-19 ENCOUNTER — Ambulatory Visit (INDEPENDENT_AMBULATORY_CARE_PROVIDER_SITE_OTHER): Payer: 59 | Admitting: Adult Health

## 2018-04-19 VITALS — BP 124/70 | HR 57 | Temp 97.3°F | Ht 63.0 in | Wt 206.8 lb

## 2018-04-19 DIAGNOSIS — J0100 Acute maxillary sinusitis, unspecified: Secondary | ICD-10-CM | POA: Diagnosis not present

## 2018-04-19 DIAGNOSIS — R55 Syncope and collapse: Secondary | ICD-10-CM

## 2018-04-19 LAB — PATHOLOGIST SMEAR REVIEW

## 2018-04-19 MED ORDER — AZITHROMYCIN 250 MG PO TABS: ORAL_TABLET | ORAL | 1 refills | Status: AC

## 2018-04-19 MED ORDER — PREDNISONE 20 MG PO TABS: ORAL_TABLET | ORAL | 0 refills | Status: DC

## 2018-04-19 NOTE — Patient Instructions (Addendum)
Vasovagal Syncope, Adult Syncope, which is commonly known as fainting or passing out, is a temporary loss of consciousness. It occurs when the blood flow to the brain is reduced. Vasovagal syncope, also called neurocardiogenic syncope, is a fainting spell that happens when blood flow to the brain is reduced because of a sudden drop in heart rate and blood pressure. Vasovagal syncope is usually harmless. However, you can get injured if you fall during a fainting spell. What are the causes? This condition is caused by a drop in heart rate and blood pressure, usually in response to a trigger. Many things and situations can trigger an episode, including:  Pain.  Fear.  The sight of blood. This may occur during medical procedures, such as when blood is being drawn from a vein.  Common activities, such as coughing, swallowing, stretching, or going to the bathroom.  Emotional stress.  Being in a confined space.  Prolonged standing, especially in a warm environment.  Lack of sleep or rest.  Not eating for a long time.  Not drinking enough liquids.  Recent illness.  Drinking alcohol.  Taking drugs that affect blood pressure, such as marijuana, cocaine, opiates, or inhalants.  What are the signs or symptoms? Before a fainting episode, you may:  Feel dizzy or light-headed.  Become pale.  Sense that you are going to faint.  Feel like the room is spinning.  Only see directly ahead (tunnel vision).  Feel sick to your stomach (nauseous).  See spots.  Slowly lose vision.  Hear ringing in your ears.  Have a headache.  Feel warm and sweaty.  Feel a sensation of pins and needles.  During the fainting spell, you may twitch or make jerky movements. Fainting spells usually last no longer than a few minutes before you wake up. If you get up too quickly before your body can recover, you may faint again. How is this diagnosed? This condition is diagnosed based on your symptoms,  your medical history, and a physical exam. Tests may be done to rule out other causes of fainting. Tests may include:  Blood tests.  Heart tests, such as an electrocardiogram (ECG), echocardiogram, or electrophysiology study.  A test to check your response to changes in position (tilt table test).  How is this treated? Usually, treatment is not needed for this condition. Your health care provider may suggest ways to help prevent fainting episodes. These may include:  Drinking additional fluids if you are exposed to a trigger.  Sitting or lying down if you notice signs that an episode is coming.  If your fainting spells continue, your health care provider may recommend that you:  Take medicines to prevent fainting or to help reduce further episodes of fainting.  Do certain exercises.  Wear compression stockings.  Have surgery to place a pacemaker in your body (rare).  Follow these instructions at home:  Learn to identify the signs that an episode is coming.  Sit or lie down at the first sign of a fainting spell. If you sit down, put your head down between your legs. If you lie down, swing your legs up in the air to increase blood flow to the brain.  Avoid hot tubs and saunas.  Avoid standing for a long time. If you have to stand for a long time, try: ? Crossing your legs. ? Flexing and stretching your leg muscles. ? Squatting. ? Moving your legs. ? Bending over.  Drink enough fluid to keep your urine clear or pale  yellow.  Make changes to your diet that your health care provider recommends. You may be told to: ? Avoid caffeine. ? Eat more salt.  Take over-the-counter and prescription medicines only as told by your health care provider. Contact a health care provider if:  You continue to have fainting spells despite treatment.  You faint more often despite treatment.  You lose consciousness for more than a few minutes.  You faint during or after exercising or  after being startled.  You have twitching or jerky movements for longer than a few seconds during a fainting spell.  You have an episode of twitching or jerky movements without fainting. Get help right away if:  A fainting spell leads to an injury or bleeding.  You have new symptoms that occur with the fainting spells, such as: ? Shortness of breath. ? Chest pain. ? Irregular heartbeat.  You twitch or make jerky movements for more than 5 minutes.  You twitch or make jerky movements during more than one fainting spell. This information is not intended to replace advice given to you by your health care provider. Make sure you discuss any questions you have with your health care provider. Document Released: 11/07/2012 Document Revised: 05/04/2016 Document Reviewed: 09/19/2015 Elsevier Interactive Patient Education  2018 [employer].    Epidural Steroid Injection An epidural steroid injection is a shot of steroid medicine and numbing medicine that is given into the space between the spinal cord and the bones in your back (epidural space). The shot helps relieve pain caused by an irritated or swollen nerve root. The amount of pain relief you get from the injection depends on what is causing the nerve to be swollen and irritated, and how long your pain lasts. You are more likely to benefit from this injection if your pain is strong and comes on suddenly rather than if you have had pain for a long time. Tell a health care provider about:  Any allergies you have.  All medicines you are taking, including vitamins, herbs, eye drops, creams, and over-the-counter medicines.  Any problems you or family members have had with anesthetic medicines.  Any blood disorders you have.  Any surgeries you have had.  Any medical conditions you have.  Whether you are pregnant or may be pregnant. What are the risks? Generally, this is a safe procedure. However, problems may occur,  including:  Headache.  Bleeding.  Infection.  Allergic reaction to medicines.  Damage to your nerves.  What happens before the procedure? Staying hydrated Follow instructions from your health care provider about hydration, which may include:  Up to 2 hours before the procedure - you may continue to drink clear liquids, such as water, clear fruit juice, black coffee, and plain tea.  Eating and drinking restrictions Follow instructions from your health care provider about eating and drinking, which may include:  8 hours before the procedure - stop eating heavy meals or foods such as meat, fried foods, or fatty foods.  6 hours before the procedure - stop eating light meals or foods, such as toast or cereal.  6 hours before the procedure - stop drinking milk or drinks that contain milk.  2 hours before the procedure - stop drinking clear liquids.  Medicine  You may be given medicines to lower anxiety.  Ask your health care provider about: ? Changing or stopping your regular medicines. This is especially important if you are taking diabetes medicines or blood thinners. ? Taking medicines such as aspirin  and ibuprofen. These medicines can thin your blood. Do not take these medicines before your procedure if your health care provider instructs you not to. General instructions  Plan to have someone take you home from the hospital or clinic. What happens during the procedure?  You may receive a medicine to help you relax (sedative).  You will be asked to lie on your abdomen.  The injection site will be cleaned.  A numbing medicine (local anesthetic) will be used to numb the injection site.  A needle will be inserted through your skin into the epidural space. You may feel some discomfort when this happens. An X-ray machine will be used to make sure the needle is put as close as possible to the affected nerve.  A steroid medicine and a local anesthetic will be injected into  the epidural space.  The needle will be removed.  A bandage (dressing) will be put over the injection site. What happens after the procedure?  Your blood pressure, heart rate, breathing rate, and blood oxygen level will be monitored until the medicines you were given have worn off.  Your arm or leg may feel weak or numb for a few hours.  The injection site may feel sore.  Do not drive for 24 hours if you received a sedative. This information is not intended to replace advice given to you by your health care provider. Make sure you discuss any questions you have with your health care provider. Document Released: 02/28/2008 Document Revised: 05/04/2016 Document Reviewed: 03/08/2016 Elsevier Interactive Patient Education  2018 [employer].     Sinusitis, Adult Sinusitis is soreness and inflammation of your sinuses. Sinuses are hollow spaces in the bones around your face. Your sinuses are located:  Around your eyes.  In the middle of your forehead.  Behind your nose.  In your cheekbones.  Your sinuses and nasal passages are lined with a stringy fluid (mucus). Mucus normally drains out of your sinuses. When your nasal tissues become inflamed or swollen, the mucus can become trapped or blocked so air cannot flow through your sinuses. This allows bacteria, viruses, and funguses to grow, which leads to infection. Sinusitis can develop quickly and last for 7?10 days (acute) or for more than 12 weeks (chronic). Sinusitis often develops after a cold. What are the causes? This condition is caused by anything that creates swelling in the sinuses or stops mucus from draining, including:  Allergies.  Asthma.  Bacterial or viral infection.  Abnormally shaped bones between the nasal passages.  Nasal growths that contain mucus (nasal polyps).  Narrow sinus openings.  Pollutants, such as chemicals or irritants in the air.  A foreign object stuck in the nose.  A fungal infection.  This is rare.  What increases the risk? The following factors may make you more likely to develop this condition:  Having allergies or asthma.  Having had a recent cold or respiratory tract infection.  Having structural deformities or blockages in your nose or sinuses.  Having a weak immune system.  Doing a lot of swimming or diving.  Overusing nasal sprays.  Smoking.  What are the signs or symptoms? The main symptoms of this condition are pain and a feeling of pressure around the affected sinuses. Other symptoms include:  Upper toothache.  Earache.  Headache.  Bad breath.  Decreased sense of smell and taste.  A cough that may get worse at night.  Fatigue.  Fever.  Thick drainage from your nose. The drainage is  often green and it may contain pus (purulent).  Stuffy nose or congestion.  Postnasal drip. This is when extra mucus collects in the throat or back of the nose.  Swelling and warmth over the affected sinuses.  Sore throat.  Sensitivity to light.  How is this diagnosed? This condition is diagnosed based on symptoms, a medical history, and a physical exam. To find out if your condition is acute or chronic, your health care provider may:  Look in your nose for signs of nasal polyps.  Tap over the affected sinus to check for signs of infection.  View the inside of your sinuses using an imaging device that has a light attached (endoscope).  If your health care provider suspects that you have chronic sinusitis, you may also:  Be tested for allergies.  Have a sample of mucus taken from your nose (nasal culture) and checked for bacteria.  Have a mucus sample examined to see if your sinusitis is related to an allergy.  If your sinusitis does not respond to treatment and it lasts longer than 8 weeks, you may have an MRI or CT scan to check your sinuses. These scans also help to determine how severe your infection is. In rare cases, a bone biopsy may be  done to rule out more serious types of fungal sinus disease. How is this treated? Treatment for sinusitis depends on the cause and whether your condition is chronic or acute. If a virus is causing your sinusitis, your symptoms will go away on their own within 10 days. You may be given medicines to relieve your symptoms, including:  Topical nasal decongestants. They shrink swollen nasal passages and let mucus drain from your sinuses.  Antihistamines. These drugs block inflammation that is triggered by allergies. This can help to ease swelling in your nose and sinuses.  Topical nasal corticosteroids. These are nasal sprays that ease inflammation and swelling in your nose and sinuses.  Nasal saline washes. These rinses can help to get rid of thick mucus in your nose.  If your condition is caused by bacteria, you will be given an antibiotic medicine. If your condition is caused by a fungus, you will be given an antifungal medicine. Surgery may be needed to correct underlying conditions, such as narrow nasal passages. Surgery may also be needed to remove polyps. Follow these instructions at home: Medicines  Take, use, or apply over-the-counter and prescription medicines only as told by your health care provider. These may include nasal sprays.  If you were prescribed an antibiotic medicine, take it as told by your health care provider. Do not stop taking the antibiotic even if you start to feel better. Hydrate and Humidify  Drink enough water to keep your urine clear or pale yellow. Staying hydrated will help to thin your mucus.  Use a cool mist humidifier to keep the humidity level in your home above 50%.  Inhale steam for 10-15 minutes, 3-4 times a day or as told by your health care provider. You can do this in the bathroom while a hot shower is running.  Limit your exposure to cool or dry air. Rest  Rest as much as possible.  Sleep with your head raised (elevated).  Make sure to get  enough sleep each night. General instructions  Apply a warm, moist washcloth to your face 3-4 times a day or as told by your health care provider. This will help with discomfort.  Wash your hands often with soap and water to reduce  your exposure to viruses and other germs. If soap and water are not available, use hand sanitizer.  Do not smoke. Avoid being around people who are smoking (secondhand smoke).  Keep all follow-up visits as told by your health care provider. This is important. Contact a health care provider if:  You have a fever.  Your symptoms get worse.  Your symptoms do not improve within 10 days. Get help right away if:  You have a severe headache.  You have persistent vomiting.  You have pain or swelling around your face or eyes.  You have vision problems.  You develop confusion.  Your neck is stiff.  You have trouble breathing. This information is not intended to replace advice given to you by your health care provider. Make sure you discuss any questions you have with your health care provider. Document Released: 11/21/2005 Document Revised: 07/17/2016 Document Reviewed: 09/16/2015 Elsevier Interactive Patient Education  [employer].

## 2018-04-20 NOTE — Progress Notes (Signed)
FMLA paperwork successfully faxed to [employer] at [phone]. Mailed copy to patient address on file.

## 2018-04-28 ENCOUNTER — Other Ambulatory Visit: Payer: Self-pay | Admitting: Internal Medicine

## 2018-04-28 DIAGNOSIS — E782 Mixed hyperlipidemia: Secondary | ICD-10-CM

## 2018-04-28 DIAGNOSIS — F411 Generalized anxiety disorder: Secondary | ICD-10-CM

## 2018-05-02 ENCOUNTER — Other Ambulatory Visit: Payer: Self-pay | Admitting: Adult Health

## 2018-05-02 ENCOUNTER — Telehealth: Payer: Self-pay

## 2018-05-02 MED ORDER — AMOXICILLIN-POT CLAVULANATE 875-125 MG PO TABS: 1.0000 | ORAL_TABLET | Freq: Two times a day (BID) | ORAL | 0 refills | Status: AC

## 2018-05-02 MED ORDER — PREDNISONE 20 MG PO TABS: ORAL_TABLET | ORAL | 0 refills | Status: DC

## 2018-05-02 NOTE — Telephone Encounter (Signed)
Seen in our office on 04/17/18 for sinus infection. Still not feeling better and finished all meds. The prednisone was only good for the last 3 days but now feels the pressure- sinus headache, sore throat. Please advise

## 2018-05-03 NOTE — Telephone Encounter (Signed)
Patient notified

## 2018-05-21 ENCOUNTER — Encounter: Payer: Self-pay | Admitting: Internal Medicine

## 2018-06-16 LAB — HM MAMMOGRAPHY

## 2018-06-19 ENCOUNTER — Other Ambulatory Visit: Payer: Self-pay | Admitting: Physician Assistant

## 2018-07-03 ENCOUNTER — Ambulatory Visit (INDEPENDENT_AMBULATORY_CARE_PROVIDER_SITE_OTHER): Payer: 59 | Admitting: Internal Medicine

## 2018-07-03 ENCOUNTER — Ambulatory Visit: Payer: Self-pay | Admitting: Adult Health

## 2018-07-03 VITALS — BP 140/82 | HR 60 | Temp 97.0°F | Resp 16 | Ht 63.0 in | Wt 201.2 lb

## 2018-07-03 DIAGNOSIS — A084 Viral intestinal infection, unspecified: Secondary | ICD-10-CM | POA: Diagnosis not present

## 2018-07-03 MED ORDER — ESOMEPRAZOLE MAGNESIUM 40 MG PO CPDR: 40.0000 mg | DELAYED_RELEASE_CAPSULE | Freq: Every day | ORAL | 1 refills | Status: DC

## 2018-07-03 MED ORDER — HYOSCYAMINE SULFATE 0.125 MG SL SUBL: SUBLINGUAL_TABLET | SUBLINGUAL | 0 refills | Status: DC

## 2018-07-03 MED ORDER — ONDANSETRON HCL 8 MG PO TABS: ORAL_TABLET | ORAL | 1 refills | Status: DC

## 2018-07-03 NOTE — Progress Notes (Signed)
Subjective:    Patient ID: Rebecca Bond, female    DOB: Jul 03, 1946, 72 y.o.   MRN: [id number]  HPI    This very nice 72 yo MWF with HTN, HLD, PreDM & indolent  CLL presents with 3-4 day hx/o intermittent N/V, heartburn/reflux and diarrhea. Denies fever, chills, sweats, rashes, Respiratory or GU sx's. No Blood noted in emesis or BM's. Has used Imodium  Prn. BP was initially elevated and rechecked at goal.  Ms..medp Allergies  Allergen Reactions  . Hyzaar [Losartan Potassium-Hctz] Other (See Comments)    "messed up my sodium counts" swells up lips.  Rebecca Bond [Pseudoephedrine Hcl] Palpitations  . Levaquin [Levofloxacin In D5w] Diarrhea and Nausea Only  . Acyclovir And Related   . Biaxin [Clarithromycin]     GI Upset  . Celexa [Citalopram]   . Flexeril [Cyclobenzaprine]     "zombie-like" feeling  . Fosamax [Alendronate Sodium]     GI upset  . Iohexol Hives     Code: HIVES, Desc: pt states she broke out in hive 20 yrs ago from IV contrast.     . Losartan     Angioedema  . Meloxicam     GI upset  . Norvasc [Amlodipine] Swelling  . Pseudoephedrine     Palpitations  . Zoloft [Sertraline Hcl] Other (See Comments)    Has no emotions at all    Past Medical History:  Diagnosis Date  . Anemia   . Anxiety   . Arthritis   . CLL (chronic lymphocytic leukemia) ([place]) dx'd ~ 2015  . Depression   . GERD (gastroesophageal reflux disease)   . Headache    "sinus headaches"  . History of kidney stones   . Hypertension   . OSA (obstructive sleep apnea)   . Pneumonia   . Prediabetes   . Recurrent sinus infections 08/02/2012   Past Surgical History:  Procedure Laterality Date  . ABDOMINAL HYSTERECTOMY  1980's   "endometrosis"  . APPENDECTOMY    . BREAST SURGERY    . CATARACT EXTRACTION, BILATERAL    . CHOLECYSTECTOMY  1985  . FUNCTIONAL ENDOSCOPIC SINUS SURGERY  1990's   "cause I kept having sinus infections"  . immunoglobulin treatment  2017  . LAPAROSCOPIC APPENDECTOMY N/A  06/04/2014   Procedure: APPENDECTOMY LAPAROSCOPIC;  Surgeon: Zenovia Jarred, MD;  Location: [facility];  Service: General;  Laterality: N/A;  . LUMBAR LAMINECTOMY/DECOMPRESSION MICRODISCECTOMY Left 11/01/2017   Procedure: Left Lumbar Four-Five Extraforaminal Microdiscectomy;  Surgeon: Eustace Moore, MD;  Location: [facility];  Service: Neurosurgery;  Laterality: Left;  . LYMPH NODE BIOPSY     "determined I had CLL"  . SHOULDER ARTHROSCOPY Right   . TEMPOROMANDIBULAR JOINT ARTHROPLASTY  1980's  . TONSILLECTOMY AND ADENOIDECTOMY  1950's   Review of Systems     10 point systems review negative except as above.    Objective:   Physical Exam  BP 140/82   Pulse 60   Temp (!) 97 F (36.1 C)   Resp 16   Ht 5\' 3"  (1.6 m)   Wt 201 lb 3.2 oz (91.3 kg)   BMI 35.64 kg/m   HEENT - WNL. Neck - supple.  Chest - Clear equal BS. Cor - Nl HS. RRR w/o sig MGR. PP 1(+). No edema. MS- FROM w/o deformities.  Gait Nl. Neuro -  Nl w/o focal abnormalities.    Assessment & Plan:   1. Viral gastroenteritis  - esomeprazole (NEXIUM) 40 MG capsule; Take 1 capsule (40 mg  total) by mouth daily.  Dispense: 30 capsule; Refill: 1  - ondansetron (ZOFRAN) 8 MG tablet; Take 1 tablet 3 x/day as needed for Nausea  Dispense: 30 tablet; Refill: 1  - hyoscyamine (LEVSIN SL) 0.125 MG SL tablet; Dissolve  1 to 2 tablets under tongue  3 to 4 x day if needed for Nausea, vomiting, cramping or diarrhea  Dispense: 90 tablet; Refill: 0  - discussed meds, SE's and diet.

## 2018-07-03 NOTE — Patient Instructions (Signed)
Viral Gastroenteritis, Adult  Viral gastroenteritis is also known as the stomach flu. This condition is caused by various viruses. These viruses can be passed from person to person very easily (are very contagious). This condition may affect your stomach, small intestine, and large intestine. It can cause sudden watery diarrhea, fever, and vomiting.  Diarrhea and vomiting can make you feel weak and cause you to become dehydrated. You may not be able to keep fluids down. Dehydration can make you tired and thirsty, cause you to have a dry mouth, and decrease how often you urinate. Older adults and people with other diseases or a weak immune system are at higher risk for dehydration.  It is important to replace the fluids that you lose from diarrhea and vomiting. If you become severely dehydrated, you may need to get fluids through an IV tube.  What are the causes?  Gastroenteritis is caused by various viruses, including rotavirus and norovirus. Norovirus is the most common cause in adults.  You can get sick by eating food, drinking water, or touching a surface contaminated with one of these viruses. You can also get sick from sharing utensils or other personal items with an infected person.  What increases the risk?  This condition is more likely to develop in people:  · Who have a weak defense system (immune system).  · Who live with one or more children who are younger than 2 years old.  · Who live in a nursing home.  · Who go on cruise ships.    What are the signs or symptoms?  Symptoms of this condition start suddenly 1-2 days after exposure to a virus. Symptoms may last a few days or as long as a week. The most common symptoms are watery diarrhea and vomiting. Other symptoms include:  · Fever.  · Headache.  · Fatigue.  · Pain in the abdomen.  · Chills.  · Weakness.  · Nausea.  · Muscle aches.  · Loss of appetite.    How is this diagnosed?  This condition is diagnosed with a medical history and physical exam. You  may also have a stool test to check for viruses or other infections.  How is this treated?  This condition typically goes away on its own. The focus of treatment is to restore lost fluids (rehydration). Your health care provider may recommend that you take an oral rehydration solution (ORS) to replace important salts and minerals (electrolytes) in your body. Severe cases of this condition may require giving fluids through an IV tube.  Treatment may also include medicine to help with your symptoms.  Follow these instructions at home:  Follow instructions from your health care provider about how to care for yourself at home.  Eating and drinking  Follow these recommendations as told by your health care provider:  · Take an ORS. This is a drink that is sold at pharmacies and retail stores.  · Drink clear fluids in small amounts as you are able. Clear fluids include water, ice chips, diluted fruit juice, and low-calorie sports drinks.  · Eat bland, easy-to-digest foods in small amounts as you are able. These foods include bananas, applesauce, rice, lean meats, toast, and crackers.  · Avoid fluids that contain a lot of sugar or caffeine, such as energy drinks, sports drinks, and soda.  · Avoid alcohol.  · Avoid spicy or fatty foods.    General instructions    · Drink enough fluid to keep your urine clear or   pale yellow.  · Wash your hands often. If soap and water are not available, use hand sanitizer.  · Make sure that all people in your household wash their hands well and often.  · Take over-the-counter and prescription medicines only as told by your health care provider.  · Rest at home while you recover.  · Watch your condition for any changes.  · Take a warm bath to relieve any burning or pain from frequent diarrhea episodes.  · Keep all follow-up visits as told by your health care provider. This is important.  Contact a health care provider if:  · You cannot keep fluids down.  · Your symptoms get worse.  · You have  new symptoms.  · You feel light-headed or dizzy.  · You have muscle cramps.  Get help right away if:  · You have chest pain.  · You feel extremely weak or you faint.  · You see blood in your vomit.  · Your vomit looks like coffee grounds.  · You have bloody or black stools or stools that look like tar.  · You have a severe headache, a stiff neck, or both.  · You have a rash.  · You have severe pain, cramping, or bloating in your abdomen.  · You have trouble breathing or you are breathing very quickly.  · Your heart is beating very quickly.  · Your skin feels cold and clammy.  · You feel confused.  · You have pain when you urinate.  · You have signs of dehydration, such as:  ? Dark urine, very little urine, or no urine.  ? Cracked lips.  ? Dry mouth.  ? Sunken eyes.  ? Sleepiness.  ? Weakness.  This information is not intended to replace advice given to you by your health care provider. Make sure you discuss any questions you have with your health care provider.  Document Released: 11/21/2005 Document Revised: 05/04/2016 Document Reviewed: 07/28/2015  Elsevier Interactive Patient Education © 2018 [employer]

## 2018-07-05 ENCOUNTER — Telehealth: Payer: Self-pay | Admitting: *Deleted

## 2018-07-05 ENCOUNTER — Encounter: Payer: Self-pay | Admitting: *Deleted

## 2018-07-05 NOTE — Telephone Encounter (Signed)
Per Dr Melford Aase, the patient can try Ranitidine 150 mg 2 tablets daily or OTC Nexium 22 mg 1 tablet daily for indigestion.  The patient is aware.

## 2018-07-07 ENCOUNTER — Encounter: Payer: Self-pay | Admitting: Internal Medicine

## 2018-07-17 NOTE — Progress Notes (Signed)
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 (patient reports -planned for August 2017) 13q deletion does pre-dispose her to recurrent SCC  Current Treatment: observation  Previous treatment: IVIG for several months last winter to reduce recurrent respiratory infections (Patient notes that this helped) Has not required definitive treatment for CLL at this time and has been reluctant to consider treatment recently.  INTERVAL HISTORY:  Rebecca Bond returns today for management and evaluation of [name]. The patient's last visit with [place] was on 04/18/18. The pt reports that she is doing well overall.   The pt reports that she has been seen at a pain management clinic for back pain that has worsened and refers down her legs. She is taking Hydrocodone.   The pt notes that she is working 4 days a week and feels tired, and is unable to exercise with her significant back pain.   The pt reports that she had a stomach flu 2 weeks ago and had constant diarrhea for four days.   Lab results today (07/18/18) of CBC w/diff, CMP is as follows: all values are WNL except for WBC at 72.8k, RBC at 3.63, HGB at 11.0, HCT at 33.3, PLT at 78k, Lymphs abs at 67.5k, Sodium at 134, Glucose at 103, Total Protein at 6.3. LDH 07/18/18 is WNL at 190  On review of systems, pt reports significant back pain, some fatigue, recent diarrhea and stomach virus, and denies fevers, chills, night sweats, unexpected weight loss, bleeding, bruising, and any other symptoms.   REVIEW OF SYSTEMS:   A 10+ POINT REVIEW OF SYSTEMS  WAS OBTAINED including neurology, dermatology, psychiatry, cardiac, respiratory, lymph, extremities, GI, GU, Musculoskeletal, constitutional, breasts, reproductive, HEENT.  All pertinent positives are noted in the HPI.  All others are negative.   . Past Medical History:  Diagnosis Date  . Anemia   . Anxiety   . Arthritis   . CLL (chronic lymphocytic leukemia) ([place]) dx'd ~ 2015  . Depression   . GERD (gastroesophageal reflux disease)   . Headache    "sinus headaches"  . History of kidney stones   . Hypertension   . OSA (obstructive sleep apnea)   . Pneumonia   . Prediabetes   . Recurrent sinus infections 08/02/2012    . Past Surgical History:  Procedure Laterality Date  . ABDOMINAL HYSTERECTOMY  1980's   "endometrosis"  . APPENDECTOMY    . BREAST SURGERY    . CATARACT EXTRACTION, BILATERAL    . CHOLECYSTECTOMY  1985  . FUNCTIONAL ENDOSCOPIC SINUS SURGERY  1990's   "cause I kept having sinus infections"  . immunoglobulin treatment  2017  . LAPAROSCOPIC APPENDECTOMY N/A 06/04/2014   Procedure: APPENDECTOMY LAPAROSCOPIC;  Surgeon: Zenovia Jarred, MD;  Location: [facility];  Service: General;  Laterality: N/A;  . LUMBAR LAMINECTOMY/DECOMPRESSION MICRODISCECTOMY Left 11/01/2017   Procedure: Left Lumbar Four-Five Extraforaminal Microdiscectomy;  Surgeon: Eustace Moore, MD;  Location: [facility];  Service: Neurosurgery;  Laterality: Left;  . LYMPH NODE BIOPSY     "determined I had CLL"  . SHOULDER ARTHROSCOPY Right   .  TEMPOROMANDIBULAR JOINT ARTHROPLASTY  1980's  . TONSILLECTOMY AND ADENOIDECTOMY  1950's    . Social History   Tobacco Use  . Smoking status: Former Smoker    Packs/day: 0.75    Years: 4.00    Pack years: 3.00    Types: Cigarettes    Last attempt to quit: 12/05/1968    Years since quitting: 49.6  . Smokeless tobacco: Never Used  Substance Use Topics  . Alcohol use: Yes    Alcohol/week: 7.0 standard drinks    Types: 7 Glasses of wine per week  . Drug use: No     ALLERGIES:  is allergic to hyzaar [losartan potassium-hctz]; sudafed [pseudoephedrine hcl]; levaquin [levofloxacin in d5w]; acyclovir and related; biaxin [clarithromycin]; celexa [citalopram]; flexeril [cyclobenzaprine]; fosamax [alendronate sodium]; iohexol; losartan; meloxicam; norvasc [amlodipine]; pseudoephedrine; and zoloft [sertraline hcl].  MEDICATIONS:  Current Outpatient Medications  Medication Sig Dispense Refill  . ALPRAZolam (XANAX) 1 MG tablet TAKE 1/2 TO 1 TABLET BY MOUTH THREE TIMES A DAY AS NEEDED 90 tablet 0  . Ascorbic Acid (VITAMIN C) 1000 MG tablet Take 1,500 mg by mouth daily.     . bisoprolol-hydrochlorothiazide (ZIAC) 10-6.25 MG tablet TAKE ONE TABLET BY MOUTH DAILY 90 tablet 1  . Cholecalciferol (D 2000) 2000 units TABS Take 10,000 Units by mouth daily.     . DiphenhydrAMINE HCl (BENADRYL ALLERGY PO) Take by mouth. Take 2 tablets daily    . escitalopram (LEXAPRO) 10 MG tablet TAKE ONE TABLET BY MOUTH DAILY FOR MOOD 90 tablet 1  . esomeprazole (NEXIUM) 40 MG capsule Take 1 capsule (40 mg total) by mouth daily. 30 capsule 1  . ezetimibe (ZETIA) 10 MG tablet TAKE ONE TABLET BY MOUTH DAILY 90 tablet 1  . fexofenadine (ALLEGRA) 180 MG tablet Take 1 tablet (180 mg total) by mouth daily. Take PRN for allergies. 30 tablet 6  . furosemide (LASIX) 40 MG tablet Take 20 mg daily as needed by mouth for fluid or edema.     [name] Kitchen HYDROcodone-acetaminophen (NORCO/VICODIN) 5-325 MG tablet Take 1 tablet by mouth every 6 (six) hours as needed.    . hyoscyamine (LEVSIN SL) 0.125 MG SL tablet Dissolve  1 to 2 tablets under tongue  3 to 4 x day if needed for Nausea, vomiting, cramping or diarrhea 90 tablet 0  . ondansetron (ZOFRAN) 8 MG tablet Take 1 tablet 3 x/day as needed for Nausea 30 tablet 1  . Polyethyl Glycol-Propyl Glycol (SYSTANE OP) Apply 2 drops daily to eye.    . Probiotic Product (PROBIOTIC PO) Take 1 tablet daily by mouth.    . triamcinolone (NASACORT) 55 MCG/ACT AERO nasal  inhaler Place 2 sprays daily as needed into the nose.     No current facility-administered medications for this visit.     PHYSICAL EXAMINATION: ECOG PERFORMANCE STATUS: 1 - Symptomatic but completely ambulatory  . Vitals:   07/18/18 1532  BP: (!) 184/60  Pulse: (!) 59  Resp: 20  Temp: 98.4 F (36.9 C)  SpO2: 100%    Filed Weights   07/18/18 1532  Weight: 203 lb 6.4 oz (92.3 kg)   .Body mass index is 36.03 kg/m.  GENERAL:alert, in no acute distress and comfortable SKIN: no acute rashes, no significant lesions EYES: conjunctiva are pink and non-injected, sclera anicteric OROPHARYNX: MMM, no exudates, no oropharyngeal erythema or ulceration NECK: supple, no JVD LYMPH:  no palpable lymphadenopathy in the cervical, axillary or inguinal regions LUNGS: clear to auscultation b/l with normal respiratory effort HEART: regular rate &  rhythm ABDOMEN:  normoactive bowel sounds , non tender, not distended. No palpable hepatosplenomegaly.  Extremity: no pedal edema PSYCH: alert & oriented x 3 with fluent speech NEURO: no focal motor/sensory deficits   LABORATORY DATA:   .[name] Kitchen CBC Latest Ref Rng & Units 07/18/2018 04/17/2018 04/17/2018  WBC 3.9 - 10.3 K/uL 72.8(HH) - 59.1(HH)  Hemoglobin 11.6 - 15.9 g/dL 11.0(L) 11.2(L) 11.3(L)  Hematocrit 34.8 - 46.6 % 33.3(L) 33.0(L) 34.5(L)  Platelets 145 - 400 K/uL 78(L) - 101(L)   . CBC    Component Value Date/Time   WBC 72.8 (HH) 07/18/2018 1458   RBC 3.63 (L) 07/18/2018 1458   HGB 11.0 (L) 07/18/2018 1458   HGB 10.2 (L) 04/06/2018 1555   HGB 11.2 (L) 11/08/2017 1418   HCT 33.3 (L) 07/18/2018 1458   HCT 34.8 11/08/2017 1418   PLT 78 (L) 07/18/2018 1458   PLT 95 (L) 04/06/2018 1555   PLT 140 (L) 11/08/2017 1418   MCV 91.7 07/18/2018 1458   MCV 96.4 11/08/2017 1418   MCH 30.3 07/18/2018 1458   MCHC 33.0 07/18/2018 1458   RDW 14.5 07/18/2018 1458   RDW 14.8 (H) 11/08/2017 1418   LYMPHSABS 67.5 (H) 07/18/2018 1458   LYMPHSABS 87.0  (H) 11/08/2017 1418   MONOABS 0.9 07/18/2018 1458   MONOABS 1.5 (H) 11/08/2017 1418   EOSABS 0.2 07/18/2018 1458   EOSABS 0.6 (H) 11/08/2017 1418   BASOSABS 0.1 07/18/2018 1458   BASOSABS 0.2 (H) 11/08/2017 1418      CMP Latest Ref Rng & Units 07/18/2018 04/17/2018 04/06/2018  Glucose 70 - 99 mg/dL 103(H) 104(H) 94  BUN 8 - 23 mg/dL 19 14 18   Creatinine 0.44 - 1.00 mg/dL 0.74 0.70 0.77  Sodium 135 - 145 mmol/L 134(L) 135 135(L)  Potassium 3.5 - 5.1 mmol/L 4.0 5.6(H) 3.7  Chloride 98 - 111 mmol/L 100 101 102  CO2 22 - 32 mmol/L 22 - 26  Calcium 8.9 - 10.3 mg/dL 9.2 - 9.7  Total Protein 6.5 - 8.1 g/dL 6.3(L) - 6.5  Total Bilirubin 0.3 - 1.2 mg/dL 0.5 - 0.4  Alkaline Phos 38 - 126 U/L 54 - 66  AST 15 - 41 U/L 23 - 24  ALT 0 - 44 U/L 15 - 13      RADIOGRAPHIC STUDIES: I have personally reviewed the radiological images as listed and agreed with the findings in the report.  CT ABDOMEN AND PELVIS WITHOUT CONTRAST   IMPRESSION: 1. No acute abnormalities within the abdomen or pelvis. 2. Mild splenomegaly and several prominent to mildly enlarged pelvic and inguinal lymph nodes. These findings are similar to the prior CT. 3. Small nonobstructing stone in the lower pole the right kidney. No ureteral stones or obstructive uropathy.   Electronically Signed   By: Lajean Manes M.D.   On: 11/14/2016 16:39    ASSESSMENT & PLAN:   72 y.o.  caucasian female with   1.Rai Stage 2 previously - now Stage IV CLL with lymphocytosis, LNadenopathy and mild splenomegaly with anemia and thrombocytopenia. -patient has previously and continues to desire holding off CLL directed treatment as long as possible.   Lab Results  Component Value Date   LDH 190 07/18/2018   PLAN: -Discussed pt labwork today, 07/18/18; Lymphs increased from 53.2k three months ago to 67.5k today. PLT decreased to 78k. HGB stable at 11.0. LDH is WNL at 190  -Pt's PLT have been as low as 77k and 79k in the  past -Discussed that her  PLT decreasing again could warrant treatment, and the pt notes that she does not want to initiate treatment at this time and has not developed any new constitutional symptoms.  -PLT could have decreased because of recent stomach virus.  -Will see the pt back with labs again in 2 months   2.  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 Has been taking good infection prevention precautions. CLL initially diagnosed 2009 with 13q deletion.. Only intervention thus far has been intermittent IVIG, clinically very helpful with decrease in respiratory infections.  Last imaging was CT AP 02-2015. --showed no evidence of Splenomegaly. Negative Hep B serology 2015  No overt new constitutional symptoms except mild unchanged fatigue. Has had significant recurrent headaches with IVIG - discontinued  3. Mild Anemia Hgb hemoglobin stable in the 10.5-11 range. Hgb increased to 11.3 today 04/18/18  4. Moderate thrombocytopenia PLT count at 93k has recently been in the 80-90k range. Did receive platelets pre-operatively Platelets are improved to 101k  5. H/o  Recurrent cutaneous SCC s/p Mohs surgery for SCC of the nosehealed. No issues with infection-   her 13q deletion places her at risk for recurrent SCC. -continue close f/u with dermatologist for evaluation and management of non melanoma skin cancers that can be increased in patient with CLL with 13q deletion.   RTC with Dr Irene Limbo in 2 months with labs   The total time spent in the appt was 25 minutes and more than 50% was on counseling and direct patient cares.    Sullivan Lone MD MS AAHIVMS [facility] Hematology/Oncology Physician [facility]  (Office):       [phone] (Work cell):  [phone] (Fax):           [phone]  I, Baldwin [place], am acting as a scribe for Dr. Irene Limbo  .I have reviewed the above documentation for accuracy and completeness, and I agree with the above. Brunetta Genera MD

## 2018-07-18 ENCOUNTER — Telehealth: Payer: Self-pay | Admitting: Hematology

## 2018-07-18 ENCOUNTER — Inpatient Hospital Stay: Payer: 59 | Attending: Hematology

## 2018-07-18 ENCOUNTER — Inpatient Hospital Stay: Payer: 59 | Admitting: Hematology

## 2018-07-18 VITALS — BP 184/60 | HR 59 | Temp 98.4°F | Resp 20 | Ht 63.0 in | Wt 203.4 lb

## 2018-07-18 DIAGNOSIS — D649 Anemia, unspecified: Secondary | ICD-10-CM

## 2018-07-18 DIAGNOSIS — K219 Gastro-esophageal reflux disease without esophagitis: Secondary | ICD-10-CM | POA: Insufficient documentation

## 2018-07-18 DIAGNOSIS — R5383 Other fatigue: Secondary | ICD-10-CM | POA: Diagnosis not present

## 2018-07-18 DIAGNOSIS — R590 Localized enlarged lymph nodes: Secondary | ICD-10-CM | POA: Insufficient documentation

## 2018-07-18 DIAGNOSIS — Z79899 Other long term (current) drug therapy: Secondary | ICD-10-CM

## 2018-07-18 DIAGNOSIS — Z87442 Personal history of urinary calculi: Secondary | ICD-10-CM | POA: Insufficient documentation

## 2018-07-18 DIAGNOSIS — R51 Headache: Secondary | ICD-10-CM

## 2018-07-18 DIAGNOSIS — C9110 Chronic lymphocytic leukemia of B-cell type not having achieved remission: Secondary | ICD-10-CM

## 2018-07-18 DIAGNOSIS — Z85828 Personal history of other malignant neoplasm of skin: Secondary | ICD-10-CM

## 2018-07-18 DIAGNOSIS — M549 Dorsalgia, unspecified: Secondary | ICD-10-CM | POA: Insufficient documentation

## 2018-07-18 DIAGNOSIS — I10 Essential (primary) hypertension: Secondary | ICD-10-CM | POA: Insufficient documentation

## 2018-07-18 DIAGNOSIS — R7303 Prediabetes: Secondary | ICD-10-CM | POA: Insufficient documentation

## 2018-07-18 DIAGNOSIS — D696 Thrombocytopenia, unspecified: Secondary | ICD-10-CM | POA: Diagnosis not present

## 2018-07-18 DIAGNOSIS — R161 Splenomegaly, not elsewhere classified: Secondary | ICD-10-CM

## 2018-07-18 DIAGNOSIS — D801 Nonfamilial hypogammaglobulinemia: Secondary | ICD-10-CM | POA: Insufficient documentation

## 2018-07-18 DIAGNOSIS — Z87891 Personal history of nicotine dependence: Secondary | ICD-10-CM | POA: Diagnosis not present

## 2018-07-18 DIAGNOSIS — F418 Other specified anxiety disorders: Secondary | ICD-10-CM | POA: Diagnosis not present

## 2018-07-18 LAB — CBC WITH DIFFERENTIAL/PLATELET
BASOS ABS: 0.1 10*3/uL (ref 0.0–0.1)
Basophils Relative: 0 %
EOS PCT: 0 %
Eosinophils Absolute: 0.2 10*3/uL (ref 0.0–0.5)
HEMATOCRIT: 33.3 % — AB (ref 34.8–46.6)
Hemoglobin: 11 g/dL — ABNORMAL LOW (ref 11.6–15.9)
LYMPHS PCT: 93 %
Lymphs Abs: 67.5 10*3/uL — ABNORMAL HIGH (ref 0.9–3.3)
MCH: 30.3 pg (ref 25.1–34.0)
MCHC: 33 g/dL (ref 31.5–36.0)
MCV: 91.7 fL (ref 79.5–101.0)
MONO ABS: 0.9 10*3/uL (ref 0.1–0.9)
Monocytes Relative: 1 %
Neutro Abs: 4.1 10*3/uL (ref 1.5–6.5)
Neutrophils Relative %: 6 %
Platelets: 78 10*3/uL — ABNORMAL LOW (ref 145–400)
RBC: 3.63 MIL/uL — AB (ref 3.70–5.45)
RDW: 14.5 % (ref 11.2–14.5)
WBC: 72.8 10*3/uL — AB (ref 3.9–10.3)

## 2018-07-18 LAB — CMP (CANCER CENTER ONLY)
ALBUMIN: 4.2 g/dL (ref 3.5–5.0)
ALT: 15 U/L (ref 0–44)
AST: 23 U/L (ref 15–41)
Alkaline Phosphatase: 54 U/L (ref 38–126)
Anion gap: 12 (ref 5–15)
BUN: 19 mg/dL (ref 8–23)
CHLORIDE: 100 mmol/L (ref 98–111)
CO2: 22 mmol/L (ref 22–32)
CREATININE: 0.74 mg/dL (ref 0.44–1.00)
Calcium: 9.2 mg/dL (ref 8.9–10.3)
GFR, Est AFR Am: >60 mL/min (ref >60)
Glucose, Bld: 103 mg/dL — ABNORMAL HIGH (ref 70–99)
POTASSIUM: 4 mmol/L (ref 3.5–5.1)
SODIUM: 134 mmol/L — AB (ref 135–145)
Total Bilirubin: 0.5 mg/dL (ref 0.3–1.2)
Total Protein: 6.3 g/dL — ABNORMAL LOW (ref 6.5–8.1)

## 2018-07-18 LAB — LACTATE DEHYDROGENASE: LDH: 190 U/L (ref 98–192)

## 2018-07-18 NOTE — Telephone Encounter (Signed)
Appts scheduled AVS/Calendar printed per 8/14 los °

## 2018-07-30 ENCOUNTER — Other Ambulatory Visit: Payer: Self-pay | Admitting: Internal Medicine

## 2018-08-01 ENCOUNTER — Encounter: Payer: Self-pay | Admitting: Internal Medicine

## 2018-08-01 ENCOUNTER — Ambulatory Visit: Payer: 59 | Admitting: Internal Medicine

## 2018-08-01 VITALS — BP 128/66 | HR 67 | Temp 97.5°F | Resp 16 | Ht 63.0 in | Wt 200.4 lb

## 2018-08-01 DIAGNOSIS — B029 Zoster without complications: Secondary | ICD-10-CM

## 2018-08-01 MED ORDER — VALACYCLOVIR HCL 1 G PO TABS: ORAL_TABLET | ORAL | 0 refills | Status: DC

## 2018-08-01 MED ORDER — GABAPENTIN 100 MG PO CAPS: ORAL_CAPSULE | ORAL | 2 refills | Status: DC

## 2018-08-01 MED ORDER — PREDNISONE 20 MG PO TABS: ORAL_TABLET | ORAL | 0 refills | Status: DC

## 2018-08-01 NOTE — Progress Notes (Signed)
Subjective:    Patient ID: Rebecca Bond, female    DOB: 1946/08/19, 72 y.o.   MRN: [id number]  HPI    This very nice 72 yo MWF with CLL, HTN, HLD, PreDM presents with a 1 week prodrome of pain in the Rt lower anterior chest and  A 24 hx/o hx of vesicular rash appearing classic for Shingles . Denies fever, chills, Respiratory, GI or GU sx's.   Medication Sig  . ALPRAZolam (XANAX) 1 MG tablet TAKE 1/2 TO 1 TABLET BY MOUTH THREE TIMES A DAY AS NEEDED  . Ascorbic Acid (VITAMIN C) 1000 MG tablet Take 1,500 mg by mouth daily.   . bisoprolol-hydrochlorothiazide (ZIAC) 10-6.25 MG tablet TAKE ONE TABLET BY MOUTH DAILY  . Cholecalciferol (D 2000) 2000 units TABS Take 10,000 Units by mouth daily.   . DiphenhydrAMINE HCl (BENADRYL ALLERGY PO) Take by mouth. Take 2 tablets daily  . escitalopram (LEXAPRO) 10 MG tablet TAKE ONE TABLET BY MOUTH DAILY FOR MOOD  . ezetimibe (ZETIA) 10 MG tablet TAKE ONE TABLET BY MOUTH DAILY  . fexofenadine (ALLEGRA) 180 MG tablet Take 1 tablet (180 mg total) by mouth daily. Take PRN for allergies.  Vladimir Faster Glycol-Propyl Glycol (SYSTANE OP) Apply 2 drops daily to eye.  . Probiotic Product (PROBIOTIC PO) Take 1 tablet daily by mouth.  [name] Kitchen HYDROcodone-acetaminophen (NORCO/VICODIN) 5-325 MG tablet Take 1 tablet by mouth every 6 (six) hours as needed.   No facility-administered medications prior to visit.    Allergies  Allergen Reactions  . Hyzaar [Losartan Potassium-Hctz] Other (See Comments)    "messed up my sodium counts" swells up lips.  [name] [Pseudoephedrine Hcl] Palpitations  . Levaquin [Levofloxacin In D5w] Diarrhea and Nausea Only  . Acyclovir And Related   . Biaxin [Clarithromycin]     GI Upset  . Celexa [Citalopram]   . Flexeril [Cyclobenzaprine]     "zombie-like" feeling  . Fosamax [Alendronate Sodium]     GI upset  . Iohexol Hives     Code: HIVES, Desc: pt states she broke out in hive 20 yrs ago from IV contrast.     . Losartan    Angioedema  . Meloxicam     GI upset  . Norvasc [Amlodipine] Swelling  . Pseudoephedrine     Palpitations  . Zoloft [Sertraline Hcl] Other (See Comments)    Has no emotions at all    Past Medical History:  Diagnosis Date  . Anemia   . Anxiety   . Arthritis   . CLL (chronic lymphocytic leukemia) ([place]) dx'd ~ 2015  . Depression   . GERD (gastroesophageal reflux disease)   . Headache    "sinus headaches"  . History of kidney stones   . Hypertension   . OSA (obstructive sleep apnea)   . Pneumonia   . Prediabetes   . Recurrent sinus infections 08/02/2012   Review of Systems   10 point systems review negative except as above.    Objective:   Physical Exam  BP 128/66   Pulse 67   Temp (!) 97.5 F (36.4 C)   Resp 16   Ht 5\' 3"  (1.6 m)   Wt 200 lb 6.4 oz (90.9 kg)   SpO2 95%   BMI 35.50 kg/m   HEENT - WNL. Neck - supple.  Chest - Clear equal BS. Cor - Nl HS. RRR w/o sig MGR. PP 1(+). No edema. MS- FROM w/o deformities.  Gait Nl. Neuro -  Nl w/o focal abnormalities.  Skin - apparent classic Shingles vesicular rash in the Rt lower ant Chest ~9 dermatome    Assessment & Plan:   1. Herpes zoster without complication  - predniSONE (DELTASONE) 20 MG tablet; 1 tab 3 x day for 3 days, then 1 tab 2 x day for 3 days, then 1 tab 1 x day for 5 days  Dispense: 20 tablet  - gabapentin (NEURONTIN) 100 MG capsule; Take 1 to 3 capsules 3 x/day if needed for pain  Dispense: 90 capsule; Refill: 2  - valACYclovir (VALTREX) 1000 MG tablet; Take 1 tablet 3 x day for Shingles  Dispense: 30 tablet; Refill: 0  - Discussed meds & SE's

## 2018-08-01 NOTE — Patient Instructions (Addendum)
Shingles Shingles, which is also known as herpes zoster, is an infection that causes a painful skin rash and fluid-filled blisters. Shingles is not related to genital herpes, which is a sexually transmitted infection. Shingles only develops in people who:  Have had chickenpox.  Have received the chickenpox vaccine. (This is rare.)  What are the causes? Shingles is caused by varicella-zoster virus (VZV). This is the same virus that causes chickenpox. After exposure to VZV, the virus stays in the body in an inactive (dormant) state. Shingles develops if the virus reactivates. This can happen many years after the initial exposure to VZV. It is not known what causes this virus to reactivate. What increases the risk? People who have had chickenpox or received the chickenpox vaccine are at risk for shingles. Infection is more common in people who:  Are older than age 50.  Have a weakened defense (immune) system, such as those with HIV, AIDS, or cancer.  Are taking medicines that weaken the immune system, such as transplant medicines.  Are under great stress.  What are the signs or symptoms? Early symptoms of this condition include itching, tingling, and pain in an area on your skin. Pain may be described as burning, stabbing, or throbbing. A few days or weeks after symptoms start, a painful red rash appears, usually on one side of the body in a bandlike or beltlike pattern. The rash eventually turns into fluid-filled blisters that break open, scab over, and dry up in about 2-3 weeks. At any time during the infection, you may also develop:  A fever.  Chills.  A headache.  An upset stomach.  How is this diagnosed? This condition is diagnosed with a skin exam. Sometimes, skin or fluid samples are taken from the blisters before a diagnosis is made. These samples are examined under a microscope or sent to a lab for testing. How is this treated? There is no specific cure for this condition.  Your health care provider will probably prescribe medicines to help you manage pain, recover more quickly, and avoid long-term problems. Medicines may include:  Antiviral drugs.  Anti-inflammatory drugs.  Pain medicines.  If the area involved is on your face, you may be referred to a specialist, such as an eye doctor (ophthalmologist) or an ear, nose, and throat (ENT) doctor to help you avoid eye problems, chronic pain, or disability. Follow these instructions at home: Medicines  Take medicines only as directed by your health care provider.  Apply an anti-itch or numbing cream to the affected area as directed by your health care provider. Blister and Rash Care  Take a cool bath or apply cool compresses to the area of the rash or blisters as directed by your health care provider. This may help with pain and itching.  Keep your rash covered with a loose bandage (dressing). Wear loose-fitting clothing to help ease the pain of material rubbing against the rash.  Keep your rash and blisters clean with mild soap and cool water or as directed by your health care provider.  Check your rash every day for signs of infection. These include redness, swelling, and pain that lasts or increases.  Do not pick your blisters.  Do not scratch your rash. General instructions  Rest as directed by your health care provider.  Keep all follow-up visits as directed by your health care provider. This is important.  Until your blisters scab over, your infection can cause chickenpox in people who have never had it or been vaccinated   against it. To prevent this from happening, avoid contact with other people, especially: ? Babies. ? Pregnant women. ? Children who have eczema. ? Elderly people who have transplants. ? People who have chronic illnesses, such as leukemia or AIDS. Contact a health care provider if:  Your pain is not relieved with prescribed medicines.  Your pain does not get better after  the rash heals.  Your rash looks infected. Signs of infection include redness, swelling, and pain that lasts or increases. Get help right away if:  The rash is on your face or nose.  You have facial pain, pain around your eye area, or loss of feeling on one side of your face.  You have ear pain or you have ringing in your ear.  You have loss of taste.  Your condition gets worse. This information is not intended to replace advice given to you by your health care provider. Make sure you discuss any questions you have with your health care provider. Document Released: 11/21/2005 Document Revised: 07/17/2016 Document Reviewed: 10/02/2014 Elsevier Interactive Patient Education  2018 Elsevier Inc.  

## 2018-08-06 ENCOUNTER — Encounter: Payer: Self-pay | Admitting: Internal Medicine

## 2018-08-14 ENCOUNTER — Ambulatory Visit: Payer: 59 | Admitting: Physician Assistant

## 2018-08-14 VITALS — BP 152/70 | HR 71 | Temp 98.1°F | Wt 199.0 lb

## 2018-08-14 DIAGNOSIS — C9190 Lymphoid leukemia, unspecified not having achieved remission: Secondary | ICD-10-CM

## 2018-08-14 DIAGNOSIS — J0100 Acute maxillary sinusitis, unspecified: Secondary | ICD-10-CM | POA: Diagnosis not present

## 2018-08-14 DIAGNOSIS — D849 Immunodeficiency, unspecified: Secondary | ICD-10-CM | POA: Diagnosis not present

## 2018-08-14 DIAGNOSIS — C9110 Chronic lymphocytic leukemia of B-cell type not having achieved remission: Secondary | ICD-10-CM

## 2018-08-14 DIAGNOSIS — D899 Disorder involving the immune mechanism, unspecified: Secondary | ICD-10-CM

## 2018-08-14 MED ORDER — DOXYCYCLINE HYCLATE 100 MG PO CAPS: ORAL_CAPSULE | ORAL | 0 refills | Status: DC

## 2018-08-14 MED ORDER — PREDNISONE 20 MG PO TABS: ORAL_TABLET | ORAL | 0 refills | Status: DC

## 2018-08-14 MED ORDER — PROMETHAZINE-DM 6.25-15 MG/5ML PO SYRP: 5.0000 mL | ORAL_SOLUTION | Freq: Four times a day (QID) | ORAL | 1 refills | Status: DC | PRN

## 2018-08-14 NOTE — Patient Instructions (Signed)
COLD INFORMATION   If you are not feeling better make an office visit OR CONTACT us.   Here is more info below HOW TO TREAT VIRAL COUGH AND COLD SYMPTOMS:  -Symptoms usually last at least 1 week with the worst symptoms being around day 4.  - colds usually start with a sore throat and end with a cough, and the cough can take 2 weeks to get better.  -No antibiotics are needed for colds, flu, sore throats, cough, bronchitis UNLESS symptoms are longer than 7 days OR if you are getting better then get drastically worse.  -There are a lot of combination medications (Dayquil, Nyquil, Vicks 44, tyelnol cold and sinus, ETC). Please look at the ingredients on the back so that you are treating the correct symptoms and not doubling up on medications/ingredients.    Medicines you can use  Nasal congestion  Little Remedies saline spray (aerosol/mist)- can try this, it is in the kids section - pseudoephedrine (Sudafed)- behind the counter, do not use if you have high blood pressure, medicine that have -D in them.  - phenylephrine (Sudafed PE) -Dextormethorphan + chlorpheniramine (Coridcidin HBP)- okay if you have high blood pressure -Oxymetazoline (Afrin) nasal spray- LIMIT to 3 days -Saline nasal spray -Neti pot (used distilled or bottled water)  Ear pain/congestion  -pseudoephedrine (sudafed) - Nasonex/flonase nasal spray  Fever  -Acetaminophen (Tyelnol) -Ibuprofen (Advil, motrin, aleve)  Sore Throat  -Acetaminophen (Tyelnol) -Ibuprofen (Advil, motrin, aleve) -Drink a lot of water -Gargle with salt water - Rest your voice (don't talk) -Throat sprays -Cough drops  Body Aches  -Acetaminophen (Tyelnol) -Ibuprofen (Advil, motrin, aleve)  Headache  -Acetaminophen (Tyelnol) -Ibuprofen (Advil, motrin, aleve) - Exedrin, Exedrin Migraine  Allergy symptoms (cough, sneeze, runny nose, itchy eyes) -Claritin or loratadine cheapest but likely the weakest  -Zyrtec or certizine at night  because it can make you sleepy -The strongest is allegra or fexafinadine  Cheapest at [employer], [employer], [employer]  Cough  -Dextromethorphan (Delsym)- medicine that has DM in it -Guafenesin (Mucinex/Robitussin) - cough drops - drink lots of water  Chest Congestion  -Guafenesin (Mucinex/Robitussin)  Red Itchy Eyes  - Naphcon-A  Upset Stomach  - Bland diet (nothing spicy, greasy, fried, and high acid foods like tomatoes, oranges, berries) -OKAY- cereal, bread, soup, crackers, rice -Eat smaller more frequent meals -reduce caffeine, no alcohol -Loperamide (Imodium-AD) if diarrhea -Prevacid for heart burn  General health when sick  -Hydration -wash your hands frequently -keep surfaces clean -change pillow cases and sheets often -Get fresh air but do not exercise strenuously -Vitamin D, double up on it - Vitamin C -Zinc

## 2018-08-14 NOTE — Progress Notes (Signed)
Subjective:    Patient ID: Rebecca Bond, female    DOB: [birth year], 72 y.o.   MRN: [id number]  HPI 72 y.o. obese WF with history of CLL presents with URI. She was just treated for shingles and finished the medication prednisone and valtrex. She has been exposed to strep throat. She started to feel bad Saturday, she has HA, sore throat, painful to talk, coughing at night, no SOB, CP. She started on zpak but stopped. Last prednisone was Friday.  She follows up with oncology next month.   Blood pressure (!) 152/70, pulse 71, temperature 98.1 F (36.7 C), weight 199 lb (90.3 kg).  Medications Current Outpatient Medications on File Prior to Visit  Medication Sig  . ALPRAZolam (XANAX) 1 MG tablet TAKE 1/2 TO 1 TABLET BY MOUTH THREE TIMES A DAY AS NEEDED  . Ascorbic Acid (VITAMIN C) 1000 MG tablet Take 1,500 mg by mouth daily.   . bisoprolol-hydrochlorothiazide (ZIAC) 10-6.25 MG tablet TAKE ONE TABLET BY MOUTH DAILY  . Cholecalciferol (D 2000) 2000 units TABS Take 10,000 Units by mouth daily.   . DiphenhydrAMINE HCl (BENADRYL ALLERGY PO) Take by mouth. Take 2 tablets daily  . escitalopram (LEXAPRO) 10 MG tablet TAKE ONE TABLET BY MOUTH DAILY FOR MOOD  . ezetimibe (ZETIA) 10 MG tablet TAKE ONE TABLET BY MOUTH DAILY  . fexofenadine (ALLEGRA) 180 MG tablet Take 1 tablet (180 mg total) by mouth daily. Take PRN for allergies.  [name] Kitchen gabapentin (NEURONTIN) 100 MG capsule Take 1 to 3 capsules 3 x/day if needed for pain  . Polyethyl Glycol-Propyl Glycol (SYSTANE OP) Apply 2 drops daily to eye.  . Probiotic Product (PROBIOTIC PO) Take 1 tablet daily by mouth.   No current facility-administered medications on file prior to visit.     Problem list She has CLL (chronic lymphocytic leukemia) (Rebecca Bond); Hepatitis A; Morbid obesity (BMI 37.95); Angioedema secondary to ACE/ARB; Prediabetes; Depression, major, in remission (Rebecca Bond); GERD (gastroesophageal reflux disease); Recurrent sinus infections; OSA  (obstructive sleep apnea); HTN (hypertension); Vitamin D deficiency; Hypogammaglobulinemia (Rebecca Bond); Mixed hyperlipidemia; Medication management; Environmental and seasonal allergies; BMI 37.85,   adult; Immunocompromised (Rebecca Bond); Thrombocytopenia (Rebecca Bond); and S/P lumbar laminectomy on their problem list.   Review of Systems  Constitutional: Negative for chills and diaphoresis.  HENT: Positive for congestion, postnasal drip, sinus pressure and sneezing. Negative for ear pain and sore throat.   Respiratory: Positive for cough. Negative for chest tightness, shortness of breath and wheezing.   Cardiovascular: Negative.   Gastrointestinal: Negative.   Genitourinary: Negative.   Musculoskeletal: Negative for neck pain.  Neurological: Positive for headaches.       Objective:   Physical Exam  Constitutional: She appears well-developed and well-nourished.  HENT:  Head: Normocephalic and atraumatic.  Right Ear: External ear normal.  Nose: Right sinus exhibits maxillary sinus tenderness. Right sinus exhibits no frontal sinus tenderness. Left sinus exhibits maxillary sinus tenderness. Left sinus exhibits no frontal sinus tenderness.  Eyes: Conjunctivae and EOM are normal.  Neck: Normal range of motion. Neck supple.  Cardiovascular: Normal rate, regular rhythm, normal heart sounds and intact distal pulses.  Pulmonary/Chest: Effort normal and breath sounds normal. No respiratory distress. She has no wheezes.  Abdominal: Soft. Bowel sounds are normal.  Lymphadenopathy:    She has cervical adenopathy.  Skin: Skin is warm and dry.          Assessment & Plan:  Diagnoses and all orders for this visit:  CLL (chronic lymphocytic leukemia) (Rebecca Bond) Immunocompromised (Rebecca Bond) Acute  non-recurrent maxillary sinusitis Declines labs, if continues to feel worse will go to ER, has labs next week with oncology.  -     doxycycline (VIBRAMYCIN) 100 MG capsule; Take 1 capsule twice daily with food -     predniSONE  (DELTASONE) 20 MG tablet; 1 tablet daily for 4 days. -     promethazine-dextromethorphan (PROMETHAZINE-DM) 6.25-15 MG/5ML syrup; Take 5 mLs by mouth 4 (four) times daily as needed for cough.

## 2018-08-16 ENCOUNTER — Encounter: Payer: Self-pay | Admitting: Physician Assistant

## 2018-08-17 ENCOUNTER — Telehealth: Payer: Self-pay

## 2018-08-17 NOTE — Telephone Encounter (Signed)
Informed patient that prescription for labwork is at the front desk ready for pick up. Patient stated her husband will pick up the script.

## 2018-08-18 LAB — HM DEXA SCAN: HM Dexa Scan: NORMAL

## 2018-08-20 ENCOUNTER — Encounter: Payer: Self-pay | Admitting: Internal Medicine

## 2018-08-22 ENCOUNTER — Encounter: Payer: Self-pay | Admitting: Internal Medicine

## 2018-08-22 ENCOUNTER — Ambulatory Visit: Payer: 59 | Admitting: Internal Medicine

## 2018-08-22 VITALS — BP 134/78 | HR 60 | Temp 97.5°F | Resp 16 | Ht 62.0 in | Wt 199.2 lb

## 2018-08-22 DIAGNOSIS — Z0000 Encounter for general adult medical examination without abnormal findings: Secondary | ICD-10-CM | POA: Diagnosis not present

## 2018-08-22 DIAGNOSIS — K219 Gastro-esophageal reflux disease without esophagitis: Secondary | ICD-10-CM

## 2018-08-22 DIAGNOSIS — E559 Vitamin D deficiency, unspecified: Secondary | ICD-10-CM

## 2018-08-22 DIAGNOSIS — Z1211 Encounter for screening for malignant neoplasm of colon: Secondary | ICD-10-CM

## 2018-08-22 DIAGNOSIS — Z0001 Encounter for general adult medical examination with abnormal findings: Secondary | ICD-10-CM

## 2018-08-22 DIAGNOSIS — R7303 Prediabetes: Secondary | ICD-10-CM

## 2018-08-22 DIAGNOSIS — R7309 Other abnormal glucose: Secondary | ICD-10-CM

## 2018-08-22 DIAGNOSIS — I10 Essential (primary) hypertension: Secondary | ICD-10-CM | POA: Diagnosis not present

## 2018-08-22 DIAGNOSIS — Z87891 Personal history of nicotine dependence: Secondary | ICD-10-CM

## 2018-08-22 DIAGNOSIS — Z136 Encounter for screening for cardiovascular disorders: Secondary | ICD-10-CM | POA: Diagnosis not present

## 2018-08-22 DIAGNOSIS — C9110 Chronic lymphocytic leukemia of B-cell type not having achieved remission: Secondary | ICD-10-CM

## 2018-08-22 DIAGNOSIS — Z23 Encounter for immunization: Secondary | ICD-10-CM

## 2018-08-22 DIAGNOSIS — E782 Mixed hyperlipidemia: Secondary | ICD-10-CM

## 2018-08-22 DIAGNOSIS — Z1212 Encounter for screening for malignant neoplasm of rectum: Secondary | ICD-10-CM

## 2018-08-22 DIAGNOSIS — Z8249 Family history of ischemic heart disease and other diseases of the circulatory system: Secondary | ICD-10-CM

## 2018-08-22 DIAGNOSIS — Z79899 Other long term (current) drug therapy: Secondary | ICD-10-CM

## 2018-08-22 NOTE — Patient Instructions (Signed)

## 2018-08-22 NOTE — Progress Notes (Signed)
[facility] [name], M.D.     Uvaldo Bristle. Silverio Lay, P.A.-C Liane Comber, [place] [address] [place] [address] Telephone [phone] Telefax [phone] Annual Screening/Preventative Visit & Comprehensive Evaluation &  Examination     This very nice 72 y.o. MWF  presents for a Screening /Preventative Visit & comprehensive evaluation and management of multiple medical co-morbidities.  Patient has been followed for HTN, venous insufficiency,  HLD, CLL, Prediabetes  and Vitamin D Deficiency.     Patient has CLL  since 2013 and is followed by Dr Irene Limbo.               HTN predates since 1998. Patient's BP has been controlled at home and patient denies any cardiac symptoms as chest pain, palpitations, shortness of breath, dizziness or ankle swelling. Today's BP was initially sl elevated and rechecked at goal-  134/78.      Patient's hyperlipidemia is not controlled with diet and medications. Patient denies myalgias or other medication SE's. Last lipids were not at goal: Lab Results  Component Value Date   CHOL 198 02/07/2018   HDL 59 02/07/2018   LDLCALC 114 (H) 02/07/2018   TRIG 132 02/07/2018   CHOLHDL 3.4 02/07/2018      Patient has hx/o Gluttony (BMI 36+) and prediabetes predating  (A1c 6.0%/2012 ) and patient denies reactive hypoglycemic symptoms, visual blurring, diabetic polys, or paresthesias. Last A1c was Normal & at goal: Lab Results  Component Value Date   HGBA1C 5.4 02/07/2018      Finally, patient has history of Vitamin D Deficiency ("30"/2008)  and last Vitamin D was at goal: Lab Results  Component Value Date   VD25OH 103 (H) 08/29/2017   Current Outpatient Medications on File Prior to Visit  Medication Sig  . ALPRAZolam (XANAX) 1 MG tablet TAKE 1/2 TO 1 TABLET BY MOUTH THREE TIMES A DAY AS NEEDED  . Ascorbic Acid (VITAMIN C) 1000 MG tablet Take 1,500 mg by mouth daily.   .  bisoprolol-hydrochlorothiazide (ZIAC) 10-6.25 MG tablet TAKE ONE TABLET BY MOUTH DAILY  . Cholecalciferol (D 2000) 2000 units TABS Take 10,000 Units by mouth daily.   . DiphenhydrAMINE HCl (BENADRYL ALLERGY PO) Take by mouth. Take 2 tablets daily  . doxycycline (VIBRAMYCIN) 100 MG capsule Take 1 capsule twice daily with food  . escitalopram (LEXAPRO) 10 MG tablet TAKE ONE TABLET BY MOUTH DAILY FOR MOOD  . ezetimibe (ZETIA) 10 MG tablet TAKE ONE TABLET BY MOUTH DAILY  . fexofenadine (ALLEGRA) 180 MG tablet Take 1 tablet (180 mg total) by mouth daily. Take PRN for allergies.  [name] Kitchen gabapentin (NEURONTIN) 100 MG capsule Take 1 to 3 capsules 3 x/day if needed for pain  . Polyethyl Glycol-Propyl Glycol (SYSTANE OP) Apply 2 drops daily to eye.  . Probiotic Product (PROBIOTIC PO) Take 1 tablet daily by mouth.   No current facility-administered medications on file prior to visit.    Allergies  Allergen Reactions  . Hyzaar [Losartan Potassium-Hctz] Other (See Comments)    "messed up my sodium counts" swells up lips.  [name] [Pseudoephedrine Hcl] Palpitations  . Levaquin [Levofloxacin In D5w] Diarrhea and Nausea Only  . Acyclovir And Related   . Biaxin [Clarithromycin]     GI Upset  . Celexa [Citalopram]   . Flexeril [Cyclobenzaprine]     "zombie-like" feeling  . Fosamax [Alendronate Sodium]     GI upset  . Iohexol Hives  Code: HIVES, Desc: pt states she broke out in hive 20 yrs ago from IV contrast.     . Losartan     Angioedema  . Meloxicam     GI upset  . Norvasc [Amlodipine] Swelling  . Pseudoephedrine     Palpitations  . Zoloft [Sertraline Hcl] Other (See Comments)    Has no emotions at all    Past Medical History:  Diagnosis Date  . Anemia   . Anxiety   . Arthritis   . CLL (chronic lymphocytic leukemia) ([facility]) dx'd ~ 2015  . Depression   . GERD (gastroesophageal reflux disease)   . Headache    "sinus headaches"  . History of kidney stones   . Hypertension   . OSA  (obstructive sleep apnea)   . Pneumonia   . Prediabetes   . Recurrent sinus infections 08/02/2012   Health Maintenance  Topic Date Due  . TETANUS/TDAP  07/05/2016  . INFLUENZA VACCINE  07/05/2018  . MAMMOGRAM  06/17/2019  . COLONOSCOPY  11/21/2026  . DEXA SCAN  Completed  . Hepatitis C Screening  Completed  . PNA vac Low Risk Adult  Completed   Immunization History  Administered Date(s) Administered  . Influenza, High Dose Seasonal PF 08/29/2017, 08/22/2018  . Influenza,inj,Quad PF,6+ Mos 09/15/2014, 08/13/2015  . Influenza-Unspecified 08/31/2013  . Pneumococcal Conjugate-13 09/23/2015  . Pneumococcal Polysaccharide-23 08/29/2017  . Pneumococcal-Unspecified 12/28/2010  . Td 07/05/2006, 08/22/2018   Last Colon - 11/21/2016 - Dr Hilarie Fredrickson and recc 3 yr f/u Dec 2020  Last MGM - 06/16/2018 [facility]  Past Surgical History:  Procedure Laterality Date  . ABDOMINAL HYSTERECTOMY  1980's   "endometrosis"  . APPENDECTOMY    . BREAST SURGERY    . CATARACT EXTRACTION, BILATERAL    . CHOLECYSTECTOMY  1985  . FUNCTIONAL ENDOSCOPIC SINUS SURGERY  1990's   "cause I kept having sinus infections"  . immunoglobulin treatment  2017  . LAPAROSCOPIC APPENDECTOMY N/A 06/04/2014   Procedure: APPENDECTOMY LAPAROSCOPIC;  Surgeon: Zenovia Jarred, MD;  Location: [facility];  Service: General;  Laterality: N/A;  . LUMBAR LAMINECTOMY/DECOMPRESSION MICRODISCECTOMY Left 11/01/2017   Procedure: Left Lumbar Four-Five Extraforaminal Microdiscectomy;  Surgeon: Eustace Moore, MD;  Location: [facility];  Service: Neurosurgery;  Laterality: Left;  . LYMPH NODE BIOPSY     "determined I had CLL"  . SHOULDER ARTHROSCOPY Right   . TEMPOROMANDIBULAR JOINT ARTHROPLASTY  1980's  . TONSILLECTOMY AND ADENOIDECTOMY  1950's   Family History  Problem Relation Age of Onset  . Parkinson's disease Mother   . Hypertension Mother   . Hypertension Maternal Grandmother   . Heart attack Maternal Grandmother        Mild   Social  History   Tobacco Use  . Smoking status: Former Smoker    Packs/day: 0.75    Years: 4.00    Pack years: 3.00    Types: Cigarettes    Last attempt to quit: 12/05/1968    Years since quitting: 49.7  . Smokeless tobacco: Never Used  Substance Use Topics  . Alcohol use: Yes    Alcohol/week: 7.0 standard drinks    Types: 7 Glasses of wine per week  . Drug use: No    ROS Constitutional: Denies fever, chills, weight loss/gain, headaches, insomnia,  night sweats, and change in appetite. Does c/o fatigue. Eyes: Denies redness, blurred vision, diplopia, discharge, itchy, watery eyes.  ENT: Denies discharge, congestion, post nasal drip, epistaxis, sore throat, earache, hearing loss, dental pain, Tinnitus, Vertigo,  Sinus pain, snoring.  Cardio: Denies chest pain, palpitations, irregular heartbeat, syncope, dyspnea, diaphoresis, orthopnea, PND, claudication, edema Respiratory: denies cough, dyspnea, DOE, pleurisy, hoarseness, laryngitis, wheezing.  Gastrointestinal: Denies dysphagia, heartburn, reflux, water brash, pain, cramps, nausea, vomiting, bloating, diarrhea, constipation, hematemesis, melena, hematochezia, jaundice, hemorrhoids Genitourinary: Denies dysuria, frequency, urgency, nocturia, hesitancy, discharge, hematuria, flank pain Breast: Breast lumps, nipple discharge, bleeding.  Musculoskeletal: Denies arthralgia, myalgia, stiffness, Jt. Swelling, pain, limp, and strain/sprain. Denies falls. Skin: Denies puritis, rash, hives, warts, acne, eczema, changing in skin lesion Neuro: No weakness, tremor, incoordination, spasms, paresthesia, pain Psychiatric: Denies confusion, memory loss, sensory loss. Denies Depression. Endocrine: Denies change in weight, skin, hair change, nocturia, and paresthesia, diabetic polys, visual blurring, hyper / hypo glycemic episodes.  Heme/Lymph: No excessive bleeding, bruising, enlarged lymph nodes.  Physical Exam  BP 134/78   Pulse 60   Temp (!) 97.5 F  (36.4 C)   Resp 16   Ht 5\' 2"  (1.575 m)   Wt 199 lb 3.2 oz (90.4 kg)   BMI 36.43 kg/m   General Appearance: Over nourished, well groomed and in no apparent distress.  Eyes: PERRLA, EOMs, conjunctiva no swelling or erythema, normal fundi and vessels. Sinuses: No frontal/maxillary tenderness ENT/Mouth: EACs patent / TMs  nl. Nares clear without erythema, swelling, mucoid exudates. Oral hygiene is good. No erythema, swelling, or exudate. Tongue normal, non-obstructing. Tonsils not swollen or erythematous. Hearing normal.  Neck: Supple, thyroid not palpable. No bruits, nodes or JVD. Respiratory: Respiratory effort normal.  BS equal and clear bilateral without rales, rhonci, wheezing or stridor. Cardio: Heart sounds are normal with regular rate and rhythm and no murmurs, rubs or gallops. Peripheral pulses are normal and equal bilaterally without edema. No aortic or femoral bruits. Chest: symmetric with normal excursions and percussion. Breasts: Symmetric, without lumps, nipple discharge, retractions, or fibrocystic changes.  Abdomen: Flat, soft with bowel sounds active. Nontender, no guarding, rebound, hernias, masses, or organomegaly.  Lymphatics: Non tender without lymphadenopathy.  Musculoskeletal: Full ROM all peripheral extremities, joint stability, 5/5 strength, and normal gait. Skin: Warm and dry without rashes, lesions, cyanosis, clubbing or  ecchymosis.  Neuro: Cranial nerves intact, reflexes equal bilaterally. Normal muscle tone, no cerebellar symptoms. Sensation intact.  Pysch: Alert and oriented X 3, normal affect, Insight and Judgment appropriate.   Assessment and Plan  1. Annual Preventative Screening Examination  2. Essential hypertension  - EKG 12-Lead - Urinalysis, Routine w reflex microscopic - Microalbumin / creatinine urine ratio - CBC with Differential/Platelet - COMPLETE METABOLIC PANEL WITH GFR - Magnesium - TSH  3. Hyperlipidemia, mixed  - EKG 12-Lead -  Lipid panel - TSH  4. Abnormal glucose  - EKG 12-Lead - Hemoglobin A1c - Insulin, random  5. Vitamin D deficiency  - VITAMIN D 25 Hydroxyl  6. Prediabetes  - EKG 12-Lead - Hemoglobin A1c - Insulin, random  7. CLL (chronic lymphocytic leukemia) ([facility])  - CBC with Differential/Platelet  8. Gastroesophageal reflux disease  - CBC with Differential/Platelet  9. Screening for colorectal cancer  - POC Hemoccult Bld/Stl  10. Screening for ischemic heart disease  - EKG 12-Lead  11. Former smoker  - EKG 12-Lead  12. FHx: heart disease  - EKG 12-Lead - CBC with Differential/Platelet  13. Medication management  - Urinalysis, Routine w reflex microscopic - Microalbumin / creatinine urine ratio - CBC with Differential/Platelet - COMPLETE METABOLIC PANEL WITH GFR - Magnesium - Lipid panel - TSH - Hemoglobin A1c - Insulin, random - VITAMIN D 25  Hydroxyl  14. Need for prophylactic vaccination and inoculation against influenza  - Flu vaccine HIGH DOSE PF (Fluzone High dose)  15. Need for prophylactic vaccination with tetanus-diphtheria (Td)  - Td : Tetanus/diphtheria >7yo Preservative  free          Patient was counseled in prudent diet to achieve/maintain BMI less than 25 for weight control, BP monitoring, regular exercise and medications. Discussed med's effects and SE's. Screening labs and tests as requested with regular follow-up as recommended. Over 40 minutes of exam, counseling, chart review and high complex critical decision making was performed.

## 2018-08-23 ENCOUNTER — Encounter: Payer: Self-pay | Admitting: *Deleted

## 2018-08-23 LAB — URINALYSIS, ROUTINE W REFLEX MICROSCOPIC
Bilirubin Urine: NEGATIVE
Glucose, UA: NEGATIVE
Hgb urine dipstick: NEGATIVE
Ketones, ur: NEGATIVE
Leukocytes, UA: NEGATIVE
NITRITE: NEGATIVE
PH: 6.5 (ref 5.0–8.0)
Protein, ur: NEGATIVE
SPECIFIC GRAVITY, URINE: 1.008 (ref 1.001–1.03)

## 2018-08-23 LAB — MAGNESIUM: MAGNESIUM: 1.3 mg/dL — AB (ref 1.5–2.5)

## 2018-08-23 LAB — CBC WITH DIFFERENTIAL/PLATELET
Basophils Absolute: 0 cells/uL (ref 0–200)
Basophils Relative: 0 %
EOS PCT: 0.2 %
Eosinophils Absolute: 220 cells/uL (ref 15–500)
HEMATOCRIT: 34 % — AB (ref 35.0–45.0)
HEMOGLOBIN: 10 g/dL — AB (ref 11.7–15.5)
Lymphs Abs: 103604 cells/uL — ABNORMAL HIGH (ref 850–3900)
MCH: 27.4 pg (ref 27.0–33.0)
MCHC: 29.4 g/dL — AB (ref 32.0–36.0)
MCV: 93.2 fL (ref 80.0–100.0)
MONOS PCT: 1.1 %
MPV: 9.2 fL (ref 7.5–12.5)
NEUTROS ABS: 5065 {cells}/uL (ref 1500–7800)
Neutrophils Relative %: 4.6 %
Platelets: 93 10*3/uL — ABNORMAL LOW (ref 140–400)
RBC: 3.65 10*6/uL — ABNORMAL LOW (ref 3.80–5.10)
RDW: 14.5 % (ref 11.0–15.0)
TOTAL LYMPHOCYTE: 94.1 %
WBC mixed population: 1211 cells/uL — ABNORMAL HIGH (ref 200–950)
WBC: 110.1 10*3/uL — AB (ref 3.8–10.8)

## 2018-08-23 LAB — COMPLETE METABOLIC PANEL WITH GFR
AG RATIO: 2.8 (calc) — AB (ref 1.0–2.5)
ALT: 18 U/L (ref 6–29)
AST: 22 U/L (ref 10–35)
Albumin: 4.4 g/dL (ref 3.6–5.1)
Alkaline phosphatase (APISO): 50 U/L (ref 33–130)
BILIRUBIN TOTAL: 0.9 mg/dL (ref 0.2–1.2)
BUN: 19 mg/dL (ref 7–25)
CALCIUM: 9.6 mg/dL (ref 8.6–10.4)
CHLORIDE: 94 mmol/L — AB (ref 98–110)
CO2: 22 mmol/L (ref 20–32)
Creat: 0.77 mg/dL (ref 0.60–0.93)
GFR, EST AFRICAN AMERICAN: 90 mL/min/{1.73_m2} (ref >=60)
GFR, Est Non African American: 78 mL/min/{1.73_m2} (ref >=60)
GLUCOSE: 84 mg/dL (ref 65–99)
Globulin: 1.6 g/dL (calc) — ABNORMAL LOW (ref 1.9–3.7)
Potassium: 3.8 mmol/L (ref 3.5–5.3)
Sodium: 130 mmol/L — ABNORMAL LOW (ref 135–146)
TOTAL PROTEIN: 6 g/dL — AB (ref 6.1–8.1)

## 2018-08-23 LAB — HEMOGLOBIN A1C
Hgb A1c MFr Bld: 5.5 % of total Hgb (ref <5.7)
Mean Plasma Glucose: 111 (calc)
eAG (mmol/L): 6.2 (calc)

## 2018-08-23 LAB — LIPID PANEL
CHOLESTEROL: 202 mg/dL — AB (ref <200)
HDL: 67 mg/dL (ref >50)
LDL CHOLESTEROL (CALC): 117 mg/dL — AB
Non-HDL Cholesterol (Calc): 135 mg/dL (calc) — ABNORMAL HIGH (ref <130)
TRIGLYCERIDES: 83 mg/dL (ref <150)
Total CHOL/HDL Ratio: 3 (calc) (ref <5.0)

## 2018-08-23 LAB — MICROALBUMIN / CREATININE URINE RATIO
CREATININE, URINE: 37 mg/dL (ref 20–275)
MICROALB UR: 1.2 mg/dL
MICROALB/CREAT RATIO: 32 ug/mg{creat} — AB (ref <30)

## 2018-08-23 LAB — VITAMIN D 25 HYDROXY (VIT D DEFICIENCY, FRACTURES): Vit D, 25-Hydroxy: 88 ng/mL (ref 30–100)

## 2018-08-23 LAB — TSH: TSH: 3.78 mIU/L (ref 0.40–4.50)

## 2018-08-23 LAB — INSULIN, RANDOM: Insulin: 4.3 u[IU]/mL (ref 2.0–19.6)

## 2018-08-25 ENCOUNTER — Other Ambulatory Visit: Payer: Self-pay | Admitting: Adult Health

## 2018-08-28 ENCOUNTER — Ambulatory Visit: Payer: 59 | Admitting: Cardiovascular Disease

## 2018-08-30 ENCOUNTER — Other Ambulatory Visit: Payer: Self-pay | Admitting: Internal Medicine

## 2018-08-30 ENCOUNTER — Telehealth: Payer: Self-pay | Admitting: *Deleted

## 2018-08-30 MED ORDER — FUROSEMIDE 40 MG PO TABS: ORAL_TABLET | ORAL | 0 refills | Status: DC

## 2018-08-30 NOTE — Telephone Encounter (Signed)
Patient called and reported, since starting Zetia and turmeric, she is having edema in her feet. Per Dr Melford Aase, a message was left to inform the patient, the Zetia or tumeric should not cause the edema and he has sent an RX in for Lasix 40 mg 1-2 tablets daily.

## 2018-09-11 ENCOUNTER — Encounter: Payer: Self-pay | Admitting: Physician Assistant

## 2018-09-19 ENCOUNTER — Telehealth: Payer: Self-pay | Admitting: Hematology

## 2018-09-19 ENCOUNTER — Inpatient Hospital Stay: Payer: 59 | Attending: Hematology

## 2018-09-19 ENCOUNTER — Inpatient Hospital Stay: Payer: 59 | Admitting: Hematology

## 2018-09-19 VITALS — BP 161/59 | HR 64 | Temp 98.2°F | Resp 18 | Ht 62.0 in | Wt 200.7 lb

## 2018-09-19 DIAGNOSIS — Z87891 Personal history of nicotine dependence: Secondary | ICD-10-CM

## 2018-09-19 DIAGNOSIS — D696 Thrombocytopenia, unspecified: Secondary | ICD-10-CM

## 2018-09-19 DIAGNOSIS — D649 Anemia, unspecified: Secondary | ICD-10-CM

## 2018-09-19 DIAGNOSIS — I10 Essential (primary) hypertension: Secondary | ICD-10-CM | POA: Diagnosis not present

## 2018-09-19 DIAGNOSIS — C9110 Chronic lymphocytic leukemia of B-cell type not having achieved remission: Secondary | ICD-10-CM | POA: Diagnosis present

## 2018-09-19 DIAGNOSIS — C44321 Squamous cell carcinoma of skin of nose: Secondary | ICD-10-CM | POA: Diagnosis not present

## 2018-09-19 DIAGNOSIS — Z79899 Other long term (current) drug therapy: Secondary | ICD-10-CM | POA: Insufficient documentation

## 2018-09-19 LAB — CBC WITH DIFFERENTIAL/PLATELET
Abs Immature Granulocytes: 0.31 10*3/uL — ABNORMAL HIGH (ref 0.00–0.07)
Basophils Absolute: 0 10*3/uL (ref 0.0–0.1)
Basophils Relative: 0 %
EOS ABS: 0.2 10*3/uL (ref 0.0–0.5)
EOS PCT: 0 %
HCT: 34.2 % — ABNORMAL LOW (ref 36.0–46.0)
HEMOGLOBIN: 11 g/dL — AB (ref 12.0–15.0)
Immature Granulocytes: 0 %
LYMPHS ABS: 79.7 10*3/uL — AB (ref 0.7–4.0)
LYMPHS PCT: 92 %
MCH: 30.9 pg (ref 26.0–34.0)
MCHC: 32.2 g/dL (ref 30.0–36.0)
MCV: 96.1 fL (ref 80.0–100.0)
MONO ABS: 2 10*3/uL — AB (ref 0.1–1.0)
MONOS PCT: 2 %
Neutro Abs: 5 10*3/uL (ref 1.7–7.7)
Neutrophils Relative %: 6 %
Platelets: 86 10*3/uL — ABNORMAL LOW (ref 150–400)
RBC: 3.56 MIL/uL — ABNORMAL LOW (ref 3.87–5.11)
RDW: 15.1 % (ref 11.5–15.5)
WBC: 87.2 10*3/uL (ref 4.0–10.5)
nRBC: 0 % (ref 0.0–0.2)

## 2018-09-19 LAB — CMP (CANCER CENTER ONLY)
ALBUMIN: 3.9 g/dL (ref 3.5–5.0)
ALK PHOS: 67 U/L (ref 38–126)
ALT: 21 U/L (ref 0–44)
AST: 20 U/L (ref 15–41)
Anion gap: 10 (ref 5–15)
BUN: 16 mg/dL (ref 8–23)
CO2: 25 mmol/L (ref 22–32)
Calcium: 9.2 mg/dL (ref 8.9–10.3)
Chloride: 101 mmol/L (ref 98–111)
Creatinine: 0.71 mg/dL (ref 0.44–1.00)
GFR, Est AFR Am: >60 mL/min (ref >60)
GFR, Estimated: >60 mL/min (ref >60)
GLUCOSE: 100 mg/dL — AB (ref 70–99)
POTASSIUM: 3.7 mmol/L (ref 3.5–5.1)
Sodium: 136 mmol/L (ref 135–145)
TOTAL PROTEIN: 6.2 g/dL — AB (ref 6.5–8.1)
Total Bilirubin: 0.5 mg/dL (ref 0.3–1.2)

## 2018-09-19 LAB — LACTATE DEHYDROGENASE: LDH: 184 U/L (ref 98–192)

## 2018-09-19 NOTE — Progress Notes (Signed)
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 (patient reports -planned for August 2017) 13q deletion does pre-dispose her to recurrent SCC  Current Treatment: observation  Previous treatment: IVIG for several months last winter to reduce recurrent respiratory infections (Patient notes that this helped) Has not required definitive treatment for CLL at this time and has been reluctant to consider treatment recently.  INTERVAL HISTORY:  Rebecca Bond returns today for management and evaluation of [name]. The patient's last visit with [place] was on 07/18/18. The pt reports that she is doing well overall.   The pt reports that she had shingles last month under her right breast and received Valtrex and prednisone which resolved her outbreak. She notes that her outbreak has crusted over and is no longer painful. She notes that a week ago she felt something pop in her knee and is concerned for a possible meniscus tear and received a steroid injection and is now able to walk without pain. She notes that her back pain has improved overall.   The pt notes that her energy levels have been stable and she denies any fevers, chills or night sweats.   Lab results today (09/19/18) of CBC w/diff, CMP is as follows: all values are WNL except for WBC at 87.2k, RBC at 3.56, HGB at 11.0, HCT at 34.2, PLT at 86k, Lymphs abs at 79.7k, Monocytes abs at 2.0k, Abs immature granulocytes at 0.31k, Glucose at 100, Total Protein at 6.2.  On review of systems, pt reports improved back pain, right knee pain, stable energy  levels, crusted over shingles outbreak under right breast, and denies fevers, chills, night sweats, unexpected weight loss, leg swelling, painful rashes, concerns for current infections and any other symptoms.   REVIEW OF SYSTEMS:    A 10+ POINT REVIEW OF SYSTEMS WAS OBTAINED including neurology, dermatology, psychiatry, cardiac, respiratory, lymph, extremities, GI, GU, Musculoskeletal, constitutional, breasts, reproductive, HEENT.  All pertinent positives are noted in the HPI.  All others are negative.   . Past Medical History:  Diagnosis Date  . Anemia   . Anxiety   . Arthritis   . CLL (chronic lymphocytic leukemia) ([place]) dx'd ~ 2015  . Depression   . GERD (gastroesophageal reflux disease)   . Headache    "sinus headaches"  . History of kidney stones   . Hypertension   . OSA (obstructive sleep apnea)   . Pneumonia   . Prediabetes   . Recurrent sinus infections 08/02/2012    . Past Surgical History:  Procedure Laterality Date  . ABDOMINAL HYSTERECTOMY  1980's   "endometrosis"  . APPENDECTOMY    . BREAST SURGERY    . CATARACT EXTRACTION, BILATERAL    . CHOLECYSTECTOMY  1985  . FUNCTIONAL ENDOSCOPIC SINUS SURGERY  1990's   "cause I kept having sinus infections"  . immunoglobulin treatment  2017  . LAPAROSCOPIC APPENDECTOMY N/A 06/04/2014   Procedure: APPENDECTOMY LAPAROSCOPIC;  Surgeon: Zenovia Jarred, MD;  Location: [facility];  Service: General;  Laterality: N/A;  . LUMBAR LAMINECTOMY/DECOMPRESSION MICRODISCECTOMY Left 11/01/2017   Procedure: Left Lumbar  Four-Five Extraforaminal Microdiscectomy;  Surgeon: Eustace Moore, MD;  Location: [facility];  Service: Neurosurgery;  Laterality: Left;  . LYMPH NODE BIOPSY     "determined I had CLL"  . SHOULDER ARTHROSCOPY Right   . TEMPOROMANDIBULAR JOINT ARTHROPLASTY  1980's  . TONSILLECTOMY AND ADENOIDECTOMY  1950's    . Social History   Tobacco Use  . Smoking status: Former Smoker    Packs/day: 0.75    Years: 4.00    Pack years:  3.00    Types: Cigarettes    Last attempt to quit: 12/05/1968    Years since quitting: 49.8  . Smokeless tobacco: Never Used  Substance Use Topics  . Alcohol use: Yes    Alcohol/week: 7.0 standard drinks    Types: 7 Glasses of wine per week  . Drug use: No    ALLERGIES:  is allergic to hyzaar [losartan potassium-hctz]; sudafed [pseudoephedrine hcl]; levaquin [levofloxacin in d5w]; acyclovir and related; biaxin [clarithromycin]; celexa [citalopram]; flexeril [cyclobenzaprine]; fosamax [alendronate sodium]; iohexol; losartan; meloxicam; norvasc [amlodipine]; pseudoephedrine; and zoloft [sertraline hcl].  MEDICATIONS:  Current Outpatient Medications  Medication Sig Dispense Refill  . ALPRAZolam (XANAX) 1 MG tablet TAKE 1/2 TO 1 TABLET BY MOUTH THREE TIMES A DAY AS NEEDED 90 tablet 0  . Ascorbic Acid (VITAMIN C) 1000 MG tablet Take 1,500 mg by mouth daily.     . bisoprolol-hydrochlorothiazide (ZIAC) 10-6.25 MG tablet TAKE ONE TABLET BY MOUTH DAILY 90 tablet 1  . Cholecalciferol (D 2000) 2000 units TABS Take 10,000 Units by mouth daily.     . DiphenhydrAMINE HCl (BENADRYL ALLERGY PO) Take by mouth. Take 2 tablets daily    . escitalopram (LEXAPRO) 10 MG tablet TAKE ONE TABLET BY MOUTH DAILY FOR MOOD 90 tablet 1  . ezetimibe (ZETIA) 10 MG tablet TAKE ONE TABLET BY MOUTH DAILY 90 tablet 1  . fexofenadine (ALLEGRA) 180 MG tablet Take 1 tablet (180 mg total) by mouth daily. Take PRN for allergies. 30 tablet 6  . Polyethyl Glycol-Propyl Glycol (SYSTANE OP) Apply 2 drops daily to eye.    . Probiotic Product (PROBIOTIC PO) Take 1 tablet daily by mouth.     No current facility-administered medications for this visit.     PHYSICAL EXAMINATION: ECOG PERFORMANCE STATUS: 1 - Symptomatic but completely ambulatory  Vitals:   09/19/18 1519  BP: (!) 161/59  Pulse: 64  Resp: 18  Temp: 98.2 F (36.8 C)  SpO2: 98%    Filed Weights   09/19/18 1519  Weight: 200 lb 11.2 oz (91 kg)   .Body mass  index is 36.71 kg/m.  GENERAL:alert, in no acute distress and comfortable SKIN: no acute rashes, no significant lesions EYES: conjunctiva are pink and non-injected, sclera anicteric OROPHARYNX: MMM, no exudates, no oropharyngeal erythema or ulceration NECK: supple, no JVD LYMPH:  no palpable lymphadenopathy in the cervical, axillary or inguinal regions LUNGS: clear to auscultation b/l with normal respiratory effort HEART: regular rate & rhythm ABDOMEN:  normoactive bowel sounds , non tender, not distended. No palpable hepatosplenomegaly.  Extremity: no pedal edema PSYCH: alert & oriented x 3 with fluent speech NEURO: no focal motor/sensory deficits   LABORATORY DATA:   .[name] Kitchen CBC Latest Ref Rng & Units 09/19/2018 08/22/2018 07/18/2018  WBC 4.0 - 10.5 K/uL 87.2(HH) 110.1(H) 72.8(HH)  Hemoglobin 12.0 - 15.0 g/dL 11.0(L) 10.0(L) 11.0(L)  Hematocrit 36.0 - 46.0 % 34.2(L) 34.0(L) 33.3(L)  Platelets 150 - 400 K/uL 86(L) 93(L) 78(L)   . CBC    Component Value Date/Time  WBC 87.2 (HH) 09/19/2018 1423   RBC 3.56 (L) 09/19/2018 1423   HGB 11.0 (L) 09/19/2018 1423   HGB 10.2 (L) 04/06/2018 1555   HGB 11.2 (L) 11/08/2017 1418   HCT 34.2 (L) 09/19/2018 1423   HCT 34.8 11/08/2017 1418   PLT 86 (L) 09/19/2018 1423   PLT 95 (L) 04/06/2018 1555   PLT 140 (L) 11/08/2017 1418   MCV 96.1 09/19/2018 1423   MCV 96.4 11/08/2017 1418   MCH 30.9 09/19/2018 1423   MCHC 32.2 09/19/2018 1423   RDW 15.1 09/19/2018 1423   RDW 14.8 (H) 11/08/2017 1418   LYMPHSABS 79.7 (H) 09/19/2018 1423   LYMPHSABS 87.0 (H) 11/08/2017 1418   MONOABS 2.0 (H) 09/19/2018 1423   MONOABS 1.5 (H) 11/08/2017 1418   EOSABS 0.2 09/19/2018 1423   EOSABS 0.6 (H) 11/08/2017 1418   BASOSABS 0.0 09/19/2018 1423   BASOSABS 0.2 (H) 11/08/2017 1418      CMP Latest Ref Rng & Units 09/19/2018 08/22/2018 07/18/2018  Glucose 70 - 99 mg/dL 100(H) 84 103(H)  BUN 8 - 23 mg/dL 16 19 19   Creatinine 0.44 - 1.00 mg/dL 0.71 0.77 0.74    Sodium 135 - 145 mmol/L 136 130(L) 134(L)  Potassium 3.5 - 5.1 mmol/L 3.7 3.8 4.0  Chloride 98 - 111 mmol/L 101 94(L) 100  CO2 22 - 32 mmol/L 25 22 22   Calcium 8.9 - 10.3 mg/dL 9.2 9.6 9.2  Total Protein 6.5 - 8.1 g/dL 6.2(L) 6.0(L) 6.3(L)  Total Bilirubin 0.3 - 1.2 mg/dL 0.5 0.9 0.5  Alkaline Phos 38 - 126 U/L 67 - 54  AST 15 - 41 U/L 20 22 23   ALT 0 - 44 U/L 21 18 15       RADIOGRAPHIC STUDIES: I have personally reviewed the radiological images as listed and agreed with the findings in the report.  CT ABDOMEN AND PELVIS WITHOUT CONTRAST   IMPRESSION: 1. No acute abnormalities within the abdomen or pelvis. 2. Mild splenomegaly and several prominent to mildly enlarged pelvic and inguinal lymph nodes. These findings are similar to the prior CT. 3. Small nonobstructing stone in the lower pole the right kidney. No ureteral stones or obstructive uropathy.   Electronically Signed   By: Lajean Manes M.D.   On: 11/14/2016 16:39    ASSESSMENT & PLAN:   72 y.o.  caucasian female with   1.Rai Stage 2 previously - now Stage IV CLL with lymphocytosis, LNadenopathy and mild splenomegaly with anemia and thrombocytopenia. -patient has previously and continues to desire holding off CLL directed treatment as long as possible.   Lab Results  Component Value Date   LDH 184 09/19/2018   PLAN: -Pt's PLT have been as low as 77k and 79k in the past --Discussed pt labwork today, 09/19/18; Lymphocytes abs have decreased over 4 weeks from 110k to 87.2k, HGB improved to 11.0, and PLT at 86k -Recommended that the pt receive Shingrix and stay up to date with her vaccines -The pt shows no over clinical or lab progression of [name] at this time.  -No indication for initiating active treatment at this time.  Overall patient as been hesitant to considering treatment unless absolutely necessary and has had multiple other medical considerations. -Will see the pt back in 3 months, sooner if any  new concerns   2.  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 Has been taking good infection prevention precautions. CLL initially diagnosed 2009 with 13q deletion.. Only intervention thus far has been intermittent IVIG, clinically very  helpful with decrease in respiratory infections.  Last imaging was CT AP 02-2015. --showed no evidence of Splenomegaly. Negative Hep B serology 2015  No overt new constitutional symptoms except mild unchanged fatigue. Has had significant recurrent headaches with IVIG - discontinued  3. Mild Anemia Hgb hemoglobin stable in the 10.5-11 range. Hgb increased to 11 today 09/19/2018  4. Moderate thrombocytopenia PLT count at 86k has recently been in the 80-90k range.  5. H/o  Recurrent cutaneous SCC s/p Mohs surgery for SCC of the nosehealed. No issues with infection-   her 13q deletion places her at risk for recurrent SCC. -continue close f/u with dermatologist for evaluation and management of non melanoma skin cancers that can be increased in patient with CLL with 13q deletion.   RTC with Dr Irene Limbo with labs in 3 months   The total time spent in the appt was 20 minutes and more than 50% was on counseling and direct patient cares.    Sullivan Lone MD MS AAHIVMS [facility] Hematology/Oncology Physician [facility]  (Office):       [phone] (Work cell):  [phone] (Fax):           [phone]  I, Baldwin [place], am acting as a scribe for Dr. Irene Limbo  .I have reviewed the above documentation for accuracy and completeness, and I agree with the above. Brunetta Genera MD

## 2018-09-19 NOTE — Telephone Encounter (Signed)
Scheduled appt per 10/16 los - gave pt AVS and calender per los.

## 2018-09-24 ENCOUNTER — Other Ambulatory Visit: Payer: Self-pay | Admitting: Internal Medicine

## 2018-10-05 ENCOUNTER — Other Ambulatory Visit: Payer: Self-pay | Admitting: Internal Medicine

## 2018-10-05 DIAGNOSIS — K219 Gastro-esophageal reflux disease without esophagitis: Secondary | ICD-10-CM

## 2018-10-05 MED ORDER — FAMOTIDINE 40 MG PO TABS: ORAL_TABLET | ORAL | 3 refills | Status: DC

## 2018-10-17 ENCOUNTER — Encounter: Payer: Self-pay | Admitting: Physician Assistant

## 2018-10-17 ENCOUNTER — Ambulatory Visit: Payer: 59 | Admitting: Physician Assistant

## 2018-10-17 VITALS — BP 140/82 | HR 67 | Temp 97.7°F | Resp 16 | Ht 62.0 in | Wt 197.6 lb

## 2018-10-17 DIAGNOSIS — D899 Disorder involving the immune mechanism, unspecified: Secondary | ICD-10-CM | POA: Diagnosis not present

## 2018-10-17 DIAGNOSIS — J329 Chronic sinusitis, unspecified: Secondary | ICD-10-CM

## 2018-10-17 DIAGNOSIS — D849 Immunodeficiency, unspecified: Secondary | ICD-10-CM

## 2018-10-17 DIAGNOSIS — K219 Gastro-esophageal reflux disease without esophagitis: Secondary | ICD-10-CM | POA: Diagnosis not present

## 2018-10-17 MED ORDER — FAMOTIDINE 40 MG PO TABS: ORAL_TABLET | ORAL | 3 refills | Status: DC

## 2018-10-17 MED ORDER — NEOMYCIN-POLYMYXIN-DEXAMETH 3.5-10000-0.1 OP SUSP: 1.0000 [drp] | Freq: Four times a day (QID) | OPHTHALMIC | 1 refills | Status: DC

## 2018-10-17 MED ORDER — DOXYCYCLINE HYCLATE 100 MG PO CAPS: ORAL_CAPSULE | ORAL | 0 refills | Status: DC

## 2018-10-17 MED ORDER — OMEPRAZOLE 40 MG PO CPDR: DELAYED_RELEASE_CAPSULE | ORAL | 0 refills | Status: DC

## 2018-10-17 NOTE — Patient Instructions (Signed)
Silent reflux: Not all heartburn burns...[name] KitchenMarland KitchenMarland Kitchen  What is LPR? Laryngopharyngeal reflux (LPR) or silent reflux is a condition in which acid that is made in the stomach travels up the esophagus (swallowing tube) and gets to the throat. Not everyone with reflux has a lot of heartburn or indigestion. In fact, many people with LPR never have heartburn. This is why LPR is called SILENT REFLUX, and the terms "Silent reflux" and "LPR" are often used interchangeably. Because LPR is silent, it is sometimes difficult to diagnose.  How can you tell if you have LPR?  [name] Kitchen Chronic hoarseness- Some people have hoarseness that comes and goes . throat clearing  . Cough . It can cause shortness of breath and cause asthma like symptoms. [name] Kitchen a feeling of a lump in the throat  . difficulty swallowing . a problem with too much nose and throat drainage.  . Some people will feel their esophagus spasm which feels like their heart beating hard and fast, this will usually be after a meal, at rest, or lying down at night.    How do I treat this? Treatment for LPR should be individualized, and your doctor will suggest the best treatment for you. Generally there are several treatments for LPR: . changing habits and diet to reduce reflux,  . medications to reduce stomach acid, and  . surgery to prevent reflux. Most people with LPR need to modify how and when they eat, as well as take some medication, to get well. Sometimes, nonprescription liquid antacids, such as Maalox, Gelucil and Mylanta are recommended. When used, these antacids should be taken four times each day - one tablespoon one hour after each meal and before bedtime. Dietary and lifestyle changes alone are not often enough to control LPR - medications that reduce stomach acid are also usually needed. These must be prescribed by our doctor.   TIPS FOR REDUCING REFLUX AND LPR Control your LIFE-STYLE and your DIET! [name] Kitchen If you use tobacco, QUIT.  [name] Kitchen Smoking makes you  reflux. After every cigarette you have some LPR.  . Don't wear clothing that is too tight, especially around the waist (trousers, corsets, belts).  . Do not lie down just after eating...in fact, do not eat within three hours of bedtime.  . You should be on a low-fat diet.  . Limit your intake of red meat.  . Limit your intake of butter.  [name] Kitchen Avoid fried foods.  . Avoid chocolate  . Avoid cheese.  [name] Kitchen Avoid eggs. [name] Kitchen Specifically avoid caffeine (especially coffee and tea), soda pop (especially cola) and mints.  . Avoid alcoholic beverages, particularly in the evening.   Take omeprazole over the counter for 2 weeks, then go to zantac 150-300 mg OR pepcid 20 or 40mg  at night for 2 weeks, then you can stop or continue as needed.  Avoid alcohol, spicy foods, NSAIDS (aleve, ibuprofen) at this time. See foods below.   Food Choices for Gastroesophageal Reflux Disease When you have gastroesophageal reflux disease (GERD), the foods you eat and your eating habits are very important. Choosing the right foods can help ease the discomfort of GERD. WHAT GENERAL GUIDELINES DO I NEED TO FOLLOW?  Choose fruits, vegetables, whole grains, low-fat dairy products, and low-fat meat, fish, and poultry.  Limit fats such as oils, salad dressings, butter, nuts, and avocado.  Keep a food diary to identify foods that cause symptoms.  Avoid foods that cause reflux. These may be different for different people.  Eat frequent small meals  instead of three large meals each day.  Eat your meals slowly, in a relaxed setting.  Limit fried foods.  Cook foods using methods other than frying.  Avoid drinking alcohol.  Avoid drinking large amounts of liquids with your meals.  Avoid bending over or lying down until 2-3 hours after eating. WHAT FOODS ARE NOT RECOMMENDED? The following are some foods and drinks that may worsen your symptoms: Vegetables Tomatoes. Tomato juice. Tomato and spaghetti sauce. Chili peppers.  Onion and garlic. Horseradish. Fruits Oranges, grapefruit, and lemon (fruit and juice). Meats High-fat meats, fish, and poultry. This includes hot dogs, ribs, ham, sausage, salami, and bacon. Dairy Whole milk and chocolate milk. Sour cream. Cream. Butter. Ice cream. Cream cheese.  Beverages Coffee and tea, with or without caffeine. Carbonated beverages or energy drinks. Condiments Hot sauce. Barbecue sauce.  Sweets/Desserts Chocolate and cocoa. Donuts. Peppermint and spearmint. Fats and Oils High-fat foods, including [place] fries and potato chips. Other Vinegar. Strong spices, such as black pepper, white pepper, red pepper, cayenne, curry powder, cloves, ginger, and chili powder.

## 2018-10-17 NOTE — Progress Notes (Signed)
Subjective:    Patient ID: Rebecca Bond, female    DOB: [birth year], 72 y.o.   MRN: [id number]  HPI 72 y.o. obese WF with history of CLL, immunocompromised, and recurrent infections presents with URI. Did not feel well this weekend, then Monday at work she was having chills. She had a fever on Monday and Tuesday, she has sore throat, ear fullness/pressure/sounds like the ocean, nausea, HA, has been on tylenol. Has had 2 episodes of diarrhea this AM, no AB pain, no blood.  She has started on pepcid rather than zantac due to recall, having reflux, feels mouth and tongue irritated.  She follows up with oncology next month.   Blood pressure 140/82, pulse 67, temperature 97.7 F (36.5 C), resp. rate 16, height 5\' 2"  (1.575 m), weight 197 lb 9.6 oz (89.6 kg), SpO2 97 %.  Medications Current Outpatient Medications on File Prior to Visit  Medication Sig  . ALPRAZolam (XANAX) 1 MG tablet TAKE 1/2 TO 1 TABLET BY MOUTH THREE TIMES A DAY AS NEEDED  . Ascorbic Acid (VITAMIN C) 1000 MG tablet Take 1,500 mg by mouth daily.   . bisoprolol-hydrochlorothiazide (ZIAC) 10-6.25 MG tablet TAKE ONE TABLET BY MOUTH DAILY  . Cholecalciferol (D 2000) 2000 units TABS Take 10,000 Units by mouth daily.   . DiphenhydrAMINE HCl (BENADRYL ALLERGY PO) Take by mouth. Take 2 tablets daily  . escitalopram (LEXAPRO) 10 MG tablet TAKE ONE TABLET BY MOUTH DAILY FOR MOOD  . ezetimibe (ZETIA) 10 MG tablet TAKE ONE TABLET BY MOUTH DAILY  . famotidine (PEPCID) 40 MG tablet Take 1 tablet daily for Heartburn & Reflux  . fexofenadine (ALLEGRA) 180 MG tablet Take 1 tablet (180 mg total) by mouth daily. Take PRN for allergies.  . furosemide (LASIX) 40 MG tablet TAKE 1 TO 2 TABLETS /DAY AS NEEDED FOR ANKLE SWELLING  . Polyethyl Glycol-Propyl Glycol (SYSTANE OP) Apply 2 drops daily to eye.  . Probiotic Product (PROBIOTIC PO) Take 1 tablet daily by mouth.   No current facility-administered medications on file prior to visit.      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   Review of Systems  Constitutional: Negative for chills and diaphoresis.  HENT: Positive for congestion, postnasal drip, sinus pressure and sneezing. Negative for ear pain and sore throat.   Respiratory: Positive for cough. Negative for chest tightness, shortness of breath and wheezing.   Cardiovascular: Negative.   Gastrointestinal: Negative.   Genitourinary: Negative.   Musculoskeletal: Negative for neck pain.  Neurological: Positive for headaches.       Objective:   Physical Exam  Constitutional: She appears well-developed and well-nourished.  HENT:  Head: Normocephalic and atraumatic.  Right Ear: External ear normal.  Nose: Right sinus exhibits maxillary sinus tenderness. Right sinus exhibits no frontal sinus tenderness. Left sinus exhibits maxillary sinus tenderness. Left sinus exhibits no frontal sinus tenderness.  Eyes: Conjunctivae and EOM are normal.  Neck: Normal range of motion. Neck supple.  Cardiovascular: Normal rate, regular rhythm, normal heart sounds and intact distal pulses.  Pulmonary/Chest: Effort normal and breath sounds normal. No respiratory distress. She has no wheezes.  Abdominal:  Soft. Bowel sounds are normal.  Lymphadenopathy:    She has cervical adenopathy.  Skin: Skin is warm and dry.        Assessment & Plan:  Diagnoses and all orders for this visit:  CLL (chronic lymphocytic leukemia)  ([place]) Immunocompromised ([place]) Acute non-recurrent maxillary sinusitis GERD Declines labs, if continues to feel worse will go to ER, has labs next week with oncology.  -     doxycycline (VIBRAMYCIN) 100 MG capsule; Take 1 capsule twice daily with food -     promethazine-dextromethorphan (PROMETHAZINE-DM) 6.25-15 MG/5ML syrup; Take 5 mLs by mouth 4 (four) times daily as needed for cough. Prilosec 40 mg 1 tablet daily x 30 days   Follows Dr. Assunta Curtis for back Having trouble sleeping/racing mind and wants help Will do 1-2 week follow up  The patient was advised to call immediately if she has any concerning symptoms in the interval. The patient voices understanding of current treatment options and is in agreement with the current care plan.The patient knows to call the clinic with any problems, questions or concerns or go to the ER if any further progression of symptoms.

## 2018-10-18 ENCOUNTER — Other Ambulatory Visit: Payer: Self-pay | Admitting: Physician Assistant

## 2018-10-18 ENCOUNTER — Encounter: Payer: Self-pay | Admitting: Physician Assistant

## 2018-10-18 MED ORDER — PREDNISONE 20 MG PO TABS: ORAL_TABLET | ORAL | 0 refills | Status: DC

## 2018-10-24 ENCOUNTER — Ambulatory Visit: Payer: Self-pay | Admitting: Physician Assistant

## 2018-11-07 ENCOUNTER — Ambulatory Visit: Payer: Self-pay | Admitting: Physician Assistant

## 2018-11-13 ENCOUNTER — Other Ambulatory Visit: Payer: Self-pay | Admitting: Physician Assistant

## 2018-11-13 NOTE — Progress Notes (Signed)
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  - will add on wellbutrin -     COMPLETE METABOLIC PANEL WITH GFR -     TSH -     Magnesium  The patient was advised to call immediately if she has any concerning symptoms in the interval. The patient voices understanding of current treatment options and is in agreement with the current care plan.The patient knows to call the clinic with any problems, questions or concerns or go to the ER if any further progression of symptoms.   Dysuria -     Urinalysis, Routine w reflex microscopic -     Urine Culture  Chronic low back pain without sciatica, unspecified back pain laterality -     Ambulatory referral to Pain Clinic - would like to see another pain management, interested in injections, does not want medications.   1 month Future Appointments  Date Time Provider [place]  12/19/2018  9:30 AM Vicie Mutters, PA-C [facility] None  12/19/2018  2:30 PM [facility] LAB 3 [facility] None  12/19/2018  3:00 PM Brunetta Genera, MD [facility] None  03/27/2019  9:30 AM [name], MD [facility] None  09/30/2019  3:00 PM [name], MD [facility] None     HPI 72 y.o.female with history of anxiety, CLL, depression, HTN, OSA presents for follow up 1 month.   Patient was treated for a sinus infection last month however at that visit she complained of trouble sleeping and racing thought before bed, she is here today to discuss treatment. She states she will have racing thoughts at night only, states not during the  day. She has trouble sleeping. She will worry about her daughter that is a Engineer, structural, will have thoughts about church, thoughts are jumping around and worrying.    She is extremely tired, having night sweats again, she is sleeping a lot, very hard for her to get out of bed. She states she gets out of work at 20, she goes and lays down, will eat and then goes to bed at 8, wakes up at 615. Wednesday, her day off she will just sleep and not do anything. She is afraid that the CLL is worse. Has follow up Jan 15th.   Had surgery and states that back is worse. She is following with pain management, Dr. Assunta Curtis and doing injections. She states she would like to see someone else possibly.   Blood pressure 134/80, pulse 70, temperature (!) 97.5 F (36.4 C), height 5\' 2"  (1.575 m), weight 196 lb 12.8 oz (89.3 kg), SpO2 97 %.    Past Medical History:  Diagnosis Date  . Anemia   . Anxiety   . Arthritis   . 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 Depression   . GERD (gastroesophageal reflux disease)   . Headache    "sinus headaches"  . History of kidney stones   . Hypertension   . OSA (obstructive sleep apnea)   . Pneumonia   . Prediabetes   . Recurrent sinus infections 08/02/2012     Allergies  Allergen Reactions  . Hyzaar [Losartan Potassium-Hctz] Other (See  Comments)    "messed up my sodium counts" swells up lips.  [name] [Pseudoephedrine Hcl] Palpitations  . Levaquin [Levofloxacin In D5w] Diarrhea and Nausea Only  . Acyclovir And Related   . Biaxin [Clarithromycin]     GI Upset  . Celexa [Citalopram]   . Flexeril [Cyclobenzaprine]     "zombie-like" feeling  . Fosamax [Alendronate Sodium]     GI upset  . Iohexol Hives     Code: HIVES, Desc: pt states she broke out in hive 20 yrs ago from IV contrast.     . Losartan     Angioedema  . Meloxicam     GI upset  . Norvasc [Amlodipine] Swelling  . Pseudoephedrine     Palpitations  . Zoloft [Sertraline Hcl]  Other (See Comments)    Has no emotions at all     Current Outpatient Medications on File Prior to Visit  Medication Sig  . ALPRAZolam (XANAX) 1 MG tablet TAKE 1/2 TO 1 TABLET BY MOUTH THREE TIMES A DAY AS NEEDED  . Ascorbic Acid (VITAMIN C) 1000 MG tablet Take 1,500 mg by mouth daily.   . bisoprolol-hydrochlorothiazide (ZIAC) 10-6.25 MG tablet TAKE ONE TABLET BY MOUTH DAILY  . Cholecalciferol (D 2000) 2000 units TABS Take 10,000 Units by mouth daily.   . DiphenhydrAMINE HCl (BENADRYL ALLERGY PO) Take by mouth. Take 2 tablets daily  . escitalopram (LEXAPRO) 10 MG tablet TAKE ONE TABLET BY MOUTH DAILY FOR MOOD  . ezetimibe (ZETIA) 10 MG tablet TAKE ONE TABLET BY MOUTH DAILY  . famotidine (PEPCID) 40 MG tablet Take 1 tablet daily for Heartburn & Reflux  . fexofenadine (ALLEGRA) 180 MG tablet Take 1 tablet (180 mg total) by mouth daily. Take PRN for allergies.  Vladimir Faster Glycol-Propyl Glycol (SYSTANE OP) Apply 2 drops daily to eye.  . Probiotic Product (PROBIOTIC PO) Take 1 tablet daily by mouth.   No current facility-administered medications on file prior to visit.     ROS: all negative except above.   Physical Exam: Filed Weights   11/14/18 1439  Weight: 196 lb 12.8 oz (89.3 kg)   BP 134/80   Pulse 70   Temp (!) 97.5 F (36.4 C)   Ht 5\' 2"  (1.575 m)   Wt 196 lb 12.8 oz (89.3 kg)   SpO2 97%   BMI 36.00 kg/m  eneral Appearance: Well nourished, in no apparent distress, no diaphroesis Eyes: PERRLA, EOMs, conjunctiva no swelling or erythema Sinuses: + Frontal/maxillary tenderness ENT/Mouth: Ext aud canals clear, TMs without erythema, bulging. No erythema, swelling, or exudate on post pharynx.  Tonsils not swollen or erythematous. Hearing normal. Neck: Supple, thyroid normal.  Respiratory: Respiratory effort normal, BS equal bilaterally without rales, rhonchi, wheezing or stridor.  Cardio: RRR with no MRGs. Brisk peripheral pulses without edema.  Abdomen: Soft, + BS, obese,  diffusely overly tender to palpation, no peritoneal signs, + guarding, without rebound, hernias, masses. Lymphatics: Non tender without lymphadenopathy.  Musculoskeletal: Full ROM, 5/5 strength, Normal gait, Strength is normal and symmetric in arms. Skin:  Warm, dry without rashes, lesions, ecchymosis.  Neuro: Cranial nerves intact. Normal muscle tone, no cerebellar symptoms. Psych: Awake and oriented X 3, normal affect, appears anxious, Insight and Judgment appropriate.   Vicie Mutters, PA-C 2:59 PM [facility]

## 2018-11-14 ENCOUNTER — Encounter: Payer: Self-pay | Admitting: Physician Assistant

## 2018-11-14 ENCOUNTER — Ambulatory Visit (INDEPENDENT_AMBULATORY_CARE_PROVIDER_SITE_OTHER): Payer: 59 | Admitting: Physician Assistant

## 2018-11-14 VITALS — BP 134/80 | HR 70 | Temp 97.5°F | Ht 62.0 in | Wt 196.8 lb

## 2018-11-14 DIAGNOSIS — M545 Low back pain, unspecified: Secondary | ICD-10-CM

## 2018-11-14 DIAGNOSIS — R5383 Other fatigue: Secondary | ICD-10-CM

## 2018-11-14 DIAGNOSIS — R300 Dysuria: Secondary | ICD-10-CM

## 2018-11-14 DIAGNOSIS — D899 Disorder involving the immune mechanism, unspecified: Secondary | ICD-10-CM | POA: Diagnosis not present

## 2018-11-14 DIAGNOSIS — D849 Immunodeficiency, unspecified: Secondary | ICD-10-CM

## 2018-11-14 DIAGNOSIS — C9110 Chronic lymphocytic leukemia of B-cell type not having achieved remission: Secondary | ICD-10-CM | POA: Diagnosis not present

## 2018-11-14 DIAGNOSIS — K219 Gastro-esophageal reflux disease without esophagitis: Secondary | ICD-10-CM

## 2018-11-14 DIAGNOSIS — G8929 Other chronic pain: Secondary | ICD-10-CM

## 2018-11-14 MED ORDER — BUPROPION HCL ER (XL) 150 MG PO TB24: 150.0000 mg | ORAL_TABLET | ORAL | 2 refills | Status: DC

## 2018-11-14 NOTE — Patient Instructions (Addendum)
Get on the wellbutrin Will check labs   Fatigue Fatigue is feeling tired all of the time, a lack of energy, or a lack of motivation. Occasional or mild fatigue is often a normal response to activity or life in general. However, long-lasting (chronic) or extreme fatigue may indicate an underlying medical condition. Follow these instructions at home: Watch your fatigue for any changes. The following actions may help to lessen any discomfort you are feeling:  Talk to your health care provider about how much sleep you need each night. Try to get the required amount every night.  Take medicines only as directed by your health care provider.  Eat a healthy and nutritious diet. Ask your health care provider if you need help changing your diet.  Drink enough fluid to keep your urine clear or pale yellow.  Practice ways of relaxing, such as yoga, meditation, massage therapy, or acupuncture.  Exercise regularly.  Change situations that cause you stress. Try to keep your work and personal routine reasonable.  Do not abuse illegal drugs.  Limit alcohol intake to no more than 1 drink per day for nonpregnant women and 2 drinks per day for men. One drink equals 12 ounces of beer, 5 ounces of wine, or 1 ounces of hard liquor.  Take a multivitamin, if directed by your health care provider.  Contact a health care provider if:  Your fatigue does not get better.  You have a fever.  You have unintentional weight loss or gain.  You have headaches.  You have difficulty: ? Falling asleep. ? Sleeping throughout the night.  You feel angry, guilty, anxious, or sad.  You are unable to have a bowel movement (constipation).  You skin is dry.  Your legs or another part of your body is swollen. Get help right away if:  You feel confused.  Your vision is blurry.  You feel faint or pass out.  You have a severe headache.  You have severe abdominal, pelvic, or back pain.  You have chest  pain, shortness of breath, or an irregular or fast heartbeat.  You are unable to urinate or you urinate less than normal.  You develop abnormal bleeding, such as bleeding from the rectum, vagina, nose, lungs, or nipples.  You vomit blood.  You have thoughts about harming yourself or committing suicide.  You are worried that you might harm someone else. This information is not intended to replace advice given to you by your health care provider. Make sure you discuss any questions you have with your health care provider. Document Released: 09/18/2007 Document Revised: 04/28/2016 Document Reviewed: 03/25/2014 Elsevier Interactive Patient Education  [employer].

## 2018-11-15 LAB — URINALYSIS, ROUTINE W REFLEX MICROSCOPIC
BACTERIA UA: NONE SEEN /HPF
BILIRUBIN URINE: NEGATIVE
Glucose, UA: NEGATIVE
Hgb urine dipstick: NEGATIVE
Hyaline Cast: NONE SEEN /LPF
Ketones, ur: NEGATIVE
NITRITE: NEGATIVE
PROTEIN: NEGATIVE
RBC / HPF: NONE SEEN /HPF (ref 0–2)
SPECIFIC GRAVITY, URINE: 1.012 (ref 1.001–1.03)
SQUAMOUS EPITHELIAL / LPF: NONE SEEN /HPF (ref <=5)
pH: 7 (ref 5.0–8.0)

## 2018-11-15 LAB — COMPLETE METABOLIC PANEL WITH GFR
AG RATIO: 2 (calc) (ref 1.0–2.5)
ALBUMIN MSPROF: 4.1 g/dL (ref 3.6–5.1)
ALT: 16 U/L (ref 6–29)
AST: 22 U/L (ref 10–35)
Alkaline phosphatase (APISO): 69 U/L (ref 33–130)
BILIRUBIN TOTAL: 0.7 mg/dL (ref 0.2–1.2)
BUN: 12 mg/dL (ref 7–25)
CALCIUM: 9.3 mg/dL (ref 8.6–10.4)
CO2: 23 mmol/L (ref 20–32)
CREATININE: 0.77 mg/dL (ref 0.60–0.93)
Chloride: 99 mmol/L (ref 98–110)
GFR, EST NON AFRICAN AMERICAN: 77 mL/min/{1.73_m2} (ref >=60)
GFR, Est African American: 89 mL/min/{1.73_m2} (ref >=60)
GLOBULIN: 2.1 g/dL (ref 1.9–3.7)
Glucose, Bld: 94 mg/dL (ref 65–99)
Potassium: 3.7 mmol/L (ref 3.5–5.3)
SODIUM: 134 mmol/L — AB (ref 135–146)
Total Protein: 6.2 g/dL (ref 6.1–8.1)

## 2018-11-15 LAB — CBC WITH DIFFERENTIAL/PLATELET
Basophils Absolute: 0 cells/uL (ref 0–200)
Basophils Relative: 0 %
EOS ABS: 505 {cells}/uL — AB (ref 15–500)
EOS PCT: 0.4 %
HCT: 31 % — ABNORMAL LOW (ref 35.0–45.0)
HEMOGLOBIN: 9.5 g/dL — AB (ref 11.7–15.5)
LYMPHS ABS: 119259 {cells}/uL — AB (ref 850–3900)
MCH: 29.7 pg (ref 27.0–33.0)
MCHC: 30.6 g/dL — ABNORMAL LOW (ref 32.0–36.0)
MCV: 96.9 fL (ref 80.0–100.0)
MPV: 8.4 fL (ref 7.5–12.5)
Monocytes Relative: 0.6 %
NEUTROS ABS: 5679 {cells}/uL (ref 1500–7800)
NEUTROS PCT: 4.5 %
PLATELETS: 190 10*3/uL (ref 140–400)
RBC: 3.2 10*6/uL — AB (ref 3.80–5.10)
RDW: 14.2 % (ref 11.0–15.0)
Total Lymphocyte: 94.5 %
WBC mixed population: 757 cells/uL (ref 200–950)
WBC: 126.2 10*3/uL — AB (ref 3.8–10.8)

## 2018-11-15 LAB — MAGNESIUM: MAGNESIUM: 1.4 mg/dL — AB (ref 1.5–2.5)

## 2018-11-15 LAB — URINE CULTURE
MICRO NUMBER: 91485395
RESULT: NO GROWTH
SPECIMEN QUALITY:: ADEQUATE

## 2018-11-15 LAB — TSH: TSH: 3.03 m[IU]/L (ref 0.40–4.50)

## 2018-11-20 ENCOUNTER — Other Ambulatory Visit: Payer: Self-pay | Admitting: Internal Medicine

## 2018-11-20 DIAGNOSIS — F411 Generalized anxiety disorder: Secondary | ICD-10-CM

## 2018-11-23 ENCOUNTER — Telehealth: Payer: Self-pay | Admitting: *Deleted

## 2018-11-23 NOTE — Addendum Note (Signed)
Addended by: Vicie Mutters R on: 11/23/2018 11:36 AM   Modules accepted: Orders

## 2018-11-23 NOTE — Telephone Encounter (Signed)
Per Dr. Irene Limbo: Does not need earlier appt based on labs alone. Please contact patient to see how she is feeling and if she is interested in beginning treatment.  Contacted patient. Gave information per Dr. Grier Mitts instructions. Patient state she feels very fatigued and wants to see MD earlier if possible (aft appt) to discuss options. Message sent to scheduling and MD

## 2018-11-23 NOTE — Telephone Encounter (Signed)
Stop wellbutrin.

## 2018-11-23 NOTE — Telephone Encounter (Signed)
patient called to request discontinue Bupropion, having side effects. Has been taking as directed for 10 days.  Bupropion causing tremors, woke up with right shoulder and arm tremor wants to stop. Please advise how to discontinue.

## 2018-11-23 NOTE — Telephone Encounter (Signed)
Left VM that she saw Internal Medicine last week and was told that her lab results of increased WBC and decreased RBC were to be faxed to Dr. Irene Limbo. She has not heard from this office regarding the faxed results.. She asked if she should be seen earlier than currently scheduled appt 1/15. Contacted patient - left VM: advised her that no faxes have been received with her lab results, but that lab results are in [place]. Will show labs to Dr. Irene Limbo and call back to let her know if he reccomeds earlier appt.

## 2018-11-23 NOTE — Telephone Encounter (Signed)
LVM informing to stop the BUPROPION on Dec 20th 2019

## 2018-11-26 ENCOUNTER — Telehealth: Payer: Self-pay | Admitting: *Deleted

## 2018-11-26 ENCOUNTER — Telehealth: Payer: Self-pay | Admitting: Hematology

## 2018-11-26 NOTE — Telephone Encounter (Signed)
Patient called to confirm that she will have Lab and MD appt on 12/13/18. Confirmed times 2:30p and 3p with patient who verbalized understanding.

## 2018-11-26 NOTE — Telephone Encounter (Signed)
R/s appt per sch message - pt is aware of apt date and time

## 2018-12-12 ENCOUNTER — Other Ambulatory Visit: Payer: Self-pay | Admitting: Adult Health

## 2018-12-12 NOTE — Progress Notes (Signed)
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 (patient reports -planned for August 2017) 13q deletion does pre-dispose her to recurrent SCC  Current Treatment: observation  Previous treatment: IVIG for several months last winter to reduce recurrent respiratory infections (Patient notes that this helped) Has not required definitive treatment for CLL at this time and has been reluctant to consider treatment recently.  INTERVAL HISTORY:  Rebecca Bond returns today for management and evaluation of [name]. The patient's last visit with [place] was on 09/19/18. She is accompanied today by her husband. The pt reports that she is doing well overall.   The pt reports that she has little energy and is perspiring at night, though she denies characterizing these as drenching. She "feels like she is worn out," and has been feeling like this for the past two months. The pt has also noticed that the lymph nodes in her armpits have grown in size in the interim.   The pt notes that she received a steroid injection in her right knee in the interim, and had fluid removed. The pt also notes that she is still having trouble with her back, and is limiting her ambulation and inhibits her from exercising. She notes that she uses her pain medication about once a week.   The pt notes that her depression has been stable and she has continued on her lexapro. She notes she is more "frustrated, not depressed," regarding her back pain.  Lab results today (12/13/18) of CBC w/diff and CMP is as follows: all values are WNL  except for WBC at 66.5k, RBC at 3.60, HGB at 11.4, HCT at 34.8, PLT at 78k, Lymphs abs at 59.4k, Monocytes abs at 2.0k, Abs immature granulocytes at 0.22k, Total Protein at 6.4. 12/13/18 LDH at 233  On review of systems, pt reports increased perspiration, more fatigue, stable back pain, enlarging lumps in armpits, stable ankle swelling, and denies drenching night sweats, noticing any new lumps or bumps, abdominal pains, and any other symptoms.   REVIEW OF SYSTEMS:    A 10+ POINT REVIEW OF SYSTEMS WAS OBTAINED including neurology, dermatology, psychiatry, cardiac, respiratory, lymph, extremities, GI, GU, Musculoskeletal, constitutional, breasts, reproductive, HEENT.  All pertinent positives are noted in the HPI.  All others are negative.  . Past Medical History:  Diagnosis Date  . Anemia   . Anxiety   . Arthritis   . CLL (chronic lymphocytic leukemia) ([place]) dx'd ~ 2015  . Depression   . GERD (gastroesophageal reflux disease)   . Headache    "sinus headaches"  . History of kidney stones   . Hypertension   . OSA (obstructive sleep apnea)   . Pneumonia   . Prediabetes   . Recurrent sinus infections 08/02/2012    . Past Surgical History:  Procedure Laterality Date  . ABDOMINAL HYSTERECTOMY  1980's   "endometrosis"  . APPENDECTOMY    . BREAST SURGERY    . CATARACT EXTRACTION, BILATERAL    . CHOLECYSTECTOMY  1985  . FUNCTIONAL ENDOSCOPIC SINUS SURGERY  1990's   "cause I kept having sinus  infections"  . immunoglobulin treatment  2017  . LAPAROSCOPIC APPENDECTOMY N/A 06/04/2014   Procedure: APPENDECTOMY LAPAROSCOPIC;  Surgeon: Zenovia Jarred, MD;  Location: [facility];  Service: General;  Laterality: N/A;  . LUMBAR LAMINECTOMY/DECOMPRESSION MICRODISCECTOMY Left 11/01/2017   Procedure: Left Lumbar Four-Five Extraforaminal Microdiscectomy;  Surgeon: Eustace Moore, MD;  Location: [place];  Service: Neurosurgery;  Laterality: Left;  . LYMPH NODE BIOPSY     "determined I had CLL"  . SHOULDER  ARTHROSCOPY Right   . TEMPOROMANDIBULAR JOINT ARTHROPLASTY  1980's  . TONSILLECTOMY AND ADENOIDECTOMY  1950's    . Social History   Tobacco Use  . Smoking status: Former Smoker    Packs/day: 0.75    Years: 4.00    Pack years: 3.00    Types: Cigarettes    Last attempt to quit: 12/05/1968    Years since quitting: 50.0  . Smokeless tobacco: Never Used  Substance Use Topics  . Alcohol use: Yes    Alcohol/week: 7.0 standard drinks    Types: 7 Glasses of wine per week  . Drug use: No    ALLERGIES:  is allergic to hyzaar [losartan potassium-hctz]; sudafed [pseudoephedrine hcl]; levaquin [levofloxacin in d5w]; acyclovir and related; biaxin [clarithromycin]; celexa [citalopram]; flexeril [cyclobenzaprine]; fosamax [alendronate sodium]; iohexol; losartan; meloxicam; norvasc [amlodipine]; pseudoephedrine; and zoloft [sertraline hcl].  MEDICATIONS:  Current Outpatient Medications  Medication Sig Dispense Refill  . ALPRAZolam (XANAX) 1 MG tablet TAKE 1/2 TO 1 TABLET BY MOUTH THREE TIMES A DAY AS NEEDED 90 tablet 0  . Ascorbic Acid (VITAMIN C) 1000 MG tablet Take 1,500 mg by mouth daily.     . bisoprolol-hydrochlorothiazide (ZIAC) 10-6.25 MG tablet TAKE ONE TABLET BY MOUTH DAILY 90 tablet 1  . DiphenhydrAMINE HCl (BENADRYL ALLERGY PO) Take by mouth. Take 2 tablets daily    . escitalopram (LEXAPRO) 10 MG tablet TAKE ONE TABLET BY MOUTH DAILY FOR MOOD 90 tablet 1  . ezetimibe (ZETIA) 10 MG tablet TAKE ONE TABLET BY MOUTH DAILY 90 tablet 1  . famotidine (PEPCID) 40 MG tablet Take 1 tablet daily for Heartburn & Reflux 90 tablet 3  . fexofenadine (ALLEGRA) 180 MG tablet Take 1 tablet (180 mg total) by mouth daily. Take PRN for allergies. 30 tablet 6  . Polyethyl Glycol-Propyl Glycol (SYSTANE OP) Apply 2 drops daily to eye.    . Probiotic Product (PROBIOTIC PO) Take 1 tablet daily by mouth.    . Cholecalciferol (D 2000) 2000 units TABS Take 10,000 Units by mouth daily.     [name] Kitchen omeprazole (PRILOSEC)  40 MG capsule TAKE ONE CAPSULE BY MOUTH EVERY MORNING 30 MINUTES BEFORE FOOD FOR 1 MONTH, THEN TAKE PEPCID (Patient not taking: Reported on 12/13/2018) 30 capsule 0   No current facility-administered medications for this visit.     PHYSICAL EXAMINATION: ECOG PERFORMANCE STATUS: 1 - Symptomatic but completely ambulatory  Vitals:   12/13/18 1545  BP: (!) 162/67  Pulse: 66  Resp: 18  Temp: 97.6 F (36.4 C)  SpO2: 97%    Filed Weights   12/13/18 1545  Weight: 199 lb 11.2 oz (90.6 kg)   .Body mass index is 36.53 kg/m.  GENERAL:alert, in no acute distress and comfortable SKIN: no acute rashes, no significant lesions EYES: conjunctiva are pink and non-injected, sclera anicteric OROPHARYNX: MMM, no exudates, no oropharyngeal erythema or ulceration NECK: supple, no JVD LYMPH:  Couple small palpable lymph nodes in bilateral axillary, no palpable lymphadenopathy in the cervical or inguinal regions LUNGS: clear to auscultation  b/l with normal respiratory effort HEART: regular rate & rhythm ABDOMEN:  normoactive bowel sounds , non tender, not distended. No palpable hepatosplenomegaly.  Extremity: 1+ pedal edema PSYCH: alert & oriented x 3 with fluent speech NEURO: no focal motor/sensory deficits   LABORATORY DATA:   .[name] CBC Latest Ref Rng & Units 12/13/2018 11/14/2018 09/19/2018  WBC 4.0 - 10.5 K/uL 66.5(HH) 126.2(H) 87.2(HH)  Hemoglobin 12.0 - 15.0 g/dL 11.4(L) 9.5(L) 11.0(L)  Hematocrit 36.0 - 46.0 % 34.8(L) 31.0(L) 34.2(L)  Platelets 150 - 400 K/uL 78(L) 190 86(L)   . CBC    Component Value Date/Time   WBC 66.5 (HH) 12/13/2018 1440   WBC 126.2 (H) 11/14/2018 1525   RBC 3.60 (L) 12/13/2018 1440   HGB 11.4 (L) 12/13/2018 1440   HGB 11.2 (L) 11/08/2017 1418   HCT 34.8 (L) 12/13/2018 1440   HCT 34.8 11/08/2017 1418   PLT 78 (L) 12/13/2018 1440   PLT 140 (L) 11/08/2017 1418   MCV 96.7 12/13/2018 1440   MCV 96.4 11/08/2017 1418   MCH 31.7 12/13/2018 1440   MCHC 32.8  12/13/2018 1440   RDW 14.4 12/13/2018 1440   RDW 14.8 (H) 11/08/2017 1418   LYMPHSABS 59.4 (H) 12/13/2018 1440   LYMPHSABS 87.0 (H) 11/08/2017 1418   MONOABS 2.0 (H) 12/13/2018 1440   MONOABS 1.5 (H) 11/08/2017 1418   EOSABS 0.3 12/13/2018 1440   EOSABS 0.6 (H) 11/08/2017 1418   BASOSABS 0.1 12/13/2018 1440   BASOSABS 0.2 (H) 11/08/2017 1418      CMP Latest Ref Rng & Units 12/13/2018 11/14/2018 09/19/2018  Glucose 70 - 99 mg/dL 91 94 100(H)  BUN 8 - 23 mg/dL 14 12 16   Creatinine 0.44 - 1.00 mg/dL 0.80 0.77 0.71  Sodium 135 - 145 mmol/L 135 134(L) 136  Potassium 3.5 - 5.1 mmol/L 3.7 3.7 3.7  Chloride 98 - 111 mmol/L 100 99 101  CO2 22 - 32 mmol/L 23 23 25   Calcium 8.9 - 10.3 mg/dL 8.9 9.3 9.2  Total Protein 6.5 - 8.1 g/dL 6.4(L) 6.2 6.2(L)  Total Bilirubin 0.3 - 1.2 mg/dL 0.9 0.7 0.5  Alkaline Phos 38 - 126 U/L 62 - 67  AST 15 - 41 U/L 24 22 20   ALT 0 - 44 U/L 16 16 21       RADIOGRAPHIC STUDIES: I have personally reviewed the radiological images as listed and agreed with the findings in the report.  CT ABDOMEN AND PELVIS WITHOUT CONTRAST   IMPRESSION: 1. No acute abnormalities within the abdomen or pelvis. 2. Mild splenomegaly and several prominent to mildly enlarged pelvic and inguinal lymph nodes. These findings are similar to the prior CT. 3. Small nonobstructing stone in the lower pole the right kidney. No ureteral stones or obstructive uropathy.   Electronically Signed   By: Lajean Manes M.D.   On: 11/14/2016 16:39    ASSESSMENT & PLAN:   73 y.o.  caucasian female with   1.Rai Stage 2 previously - now Stage IV CLL with lymphocytosis, LNadenopathy and mild splenomegaly with anemia and thrombocytopenia. -patient has previously and continues to desire holding off CLL directed treatment as long as possible.   Lab Results  Component Value Date   LDH 233 (H) 12/13/2018   PLAN: -Discussed pt labwork today, 12/13/18; WBC decreased to 66.5k from 126.2k  last month, HGB increased to 11.4, PLT at 78k, Lymphs abs at 59.4k. LDH at 233.  -Pt received a steroid shot in the interim, which likely decreased her WBC  count -Discussed that the patient's thrombocytopenia and fatigue could both be indications to consider treatment of [name] -Also discussed that there may be other causes of her fatigue including unresolved back pain, knee pain, depression, and narcotic use -Discussed that possible treatments of up to 6 cycles of BR, or 4 cycles of Rituxan -The pt prefers to begin Rituxan every 4 weeks -Provided supplemental information  -Will set the pt up for chemotherapy counseling class  2.  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 Has been taking good infection prevention precautions. CLL initially diagnosed 2009 with 13q deletion. Only intervention thus far has been intermittent IVIG, clinically very helpful with decrease in respiratory infections.  Last imaging was CT AP 02-2015. --showed no evidence of Splenomegaly. Negative Hep B serology 2015  No overt new constitutional symptoms except mild unchanged fatigue. Has had significant recurrent headaches with IVIG - discontinued  3. Mild Anemia Hgb hemoglobin stable in the 10.5-11 range. Hgb increased to 11.4 on 12/13/17   4. Moderate thrombocytopenia PLT count at 78k and has recently been in the 80-90k range.  5. H/o  Recurrent cutaneous SCC s/p Mohs surgery for SCC of the nosehealed. No issues with infection-   her 13q deletion places her at risk for recurrent SCC. -continue close f/u with dermatologist for evaluation and management of non melanoma skin cancers that can be increased in patient with CLL with 13q deletion.   Chemo-counseling for Rituxan in 1 week Please schedule for weekly Rituxan x 4 doses starting on 12/21/2018 Weekly labs with treatment RTC with Dr Irene Limbo with 2nd dose of Rituxan for a toxicity check   The total time spent in the appt was 35 minutes and more than 50% was on  counseling and direct patient cares.    Sullivan Lone MD [place] AAHIVMS [facility] Hematology/Oncology Physician [facility]  (Office):       [phone] (Work cell):  [phone] (Fax):           [phone]  I, Baldwin [place], am acting as a scribe for Dr. Sullivan Lone.   .I have reviewed the above documentation for accuracy and completeness, and I agree with the above. Brunetta Genera MD

## 2018-12-13 ENCOUNTER — Inpatient Hospital Stay: Payer: 59 | Attending: Hematology

## 2018-12-13 ENCOUNTER — Inpatient Hospital Stay (HOSPITAL_BASED_OUTPATIENT_CLINIC_OR_DEPARTMENT_OTHER): Payer: 59 | Admitting: Hematology

## 2018-12-13 DIAGNOSIS — M549 Dorsalgia, unspecified: Secondary | ICD-10-CM | POA: Insufficient documentation

## 2018-12-13 DIAGNOSIS — Z87891 Personal history of nicotine dependence: Secondary | ICD-10-CM | POA: Insufficient documentation

## 2018-12-13 DIAGNOSIS — I10 Essential (primary) hypertension: Secondary | ICD-10-CM | POA: Insufficient documentation

## 2018-12-13 DIAGNOSIS — Z85828 Personal history of other malignant neoplasm of skin: Secondary | ICD-10-CM | POA: Insufficient documentation

## 2018-12-13 DIAGNOSIS — F418 Other specified anxiety disorders: Secondary | ICD-10-CM | POA: Diagnosis not present

## 2018-12-13 DIAGNOSIS — K219 Gastro-esophageal reflux disease without esophagitis: Secondary | ICD-10-CM | POA: Diagnosis not present

## 2018-12-13 DIAGNOSIS — C9110 Chronic lymphocytic leukemia of B-cell type not having achieved remission: Secondary | ICD-10-CM | POA: Insufficient documentation

## 2018-12-13 DIAGNOSIS — R161 Splenomegaly, not elsewhere classified: Secondary | ICD-10-CM | POA: Insufficient documentation

## 2018-12-13 DIAGNOSIS — D649 Anemia, unspecified: Secondary | ICD-10-CM

## 2018-12-13 DIAGNOSIS — D696 Thrombocytopenia, unspecified: Secondary | ICD-10-CM | POA: Diagnosis not present

## 2018-12-13 DIAGNOSIS — M25569 Pain in unspecified knee: Secondary | ICD-10-CM | POA: Insufficient documentation

## 2018-12-13 DIAGNOSIS — D801 Nonfamilial hypogammaglobulinemia: Secondary | ICD-10-CM

## 2018-12-13 DIAGNOSIS — M1990 Unspecified osteoarthritis, unspecified site: Secondary | ICD-10-CM | POA: Insufficient documentation

## 2018-12-13 DIAGNOSIS — Z79899 Other long term (current) drug therapy: Secondary | ICD-10-CM | POA: Insufficient documentation

## 2018-12-13 DIAGNOSIS — Z7189 Other specified counseling: Secondary | ICD-10-CM

## 2018-12-13 DIAGNOSIS — R5383 Other fatigue: Secondary | ICD-10-CM | POA: Insufficient documentation

## 2018-12-13 LAB — CBC WITH DIFFERENTIAL (CANCER CENTER ONLY)
Abs Immature Granulocytes: 0.22 10*3/uL — ABNORMAL HIGH (ref 0.00–0.07)
Basophils Absolute: 0.1 10*3/uL (ref 0.0–0.1)
Basophils Relative: 0 %
Eosinophils Absolute: 0.3 10*3/uL (ref 0.0–0.5)
Eosinophils Relative: 1 %
HCT: 34.8 % — ABNORMAL LOW (ref 36.0–46.0)
Hemoglobin: 11.4 g/dL — ABNORMAL LOW (ref 12.0–15.0)
Immature Granulocytes: 0 %
Lymphocytes Relative: 89 %
Lymphs Abs: 59.4 10*3/uL — ABNORMAL HIGH (ref 0.7–4.0)
MCH: 31.7 pg (ref 26.0–34.0)
MCHC: 32.8 g/dL (ref 30.0–36.0)
MCV: 96.7 fL (ref 80.0–100.0)
Monocytes Absolute: 2 10*3/uL — ABNORMAL HIGH (ref 0.1–1.0)
Monocytes Relative: 3 %
NEUTROS ABS: 4.5 10*3/uL (ref 1.7–7.7)
Neutrophils Relative %: 7 %
PLATELETS: 78 10*3/uL — AB (ref 150–400)
RBC: 3.6 MIL/uL — ABNORMAL LOW (ref 3.87–5.11)
RDW: 14.4 % (ref 11.5–15.5)
WBC Count: 66.5 10*3/uL (ref 4.0–10.5)
nRBC: 0 % (ref 0.0–0.2)

## 2018-12-13 LAB — LACTATE DEHYDROGENASE: LDH: 233 U/L — ABNORMAL HIGH (ref 98–192)

## 2018-12-13 LAB — CMP (CANCER CENTER ONLY)
ALT: 16 U/L (ref 0–44)
AST: 24 U/L (ref 15–41)
Albumin: 4.2 g/dL (ref 3.5–5.0)
Alkaline Phosphatase: 62 U/L (ref 38–126)
Anion gap: 12 (ref 5–15)
BUN: 14 mg/dL (ref 8–23)
CO2: 23 mmol/L (ref 22–32)
Calcium: 8.9 mg/dL (ref 8.9–10.3)
Chloride: 100 mmol/L (ref 98–111)
Creatinine: 0.8 mg/dL (ref 0.44–1.00)
GFR, Est AFR Am: >60 mL/min (ref >60)
GFR, Estimated: >60 mL/min (ref >60)
Glucose, Bld: 91 mg/dL (ref 70–99)
Potassium: 3.7 mmol/L (ref 3.5–5.1)
Sodium: 135 mmol/L (ref 135–145)
Total Bilirubin: 0.9 mg/dL (ref 0.3–1.2)
Total Protein: 6.4 g/dL — ABNORMAL LOW (ref 6.5–8.1)

## 2018-12-13 NOTE — Patient Instructions (Signed)
Thank you for choosing [facility] to provide your oncology and hematology care.  To afford each patient quality time with our providers, please arrive 30 minutes before your scheduled appointment time.  If you arrive late for your appointment, you may be asked to reschedule.  We strive to give you quality time with our providers, and arriving late affects you and other patients whose appointments are after yours.    If you are a no show for multiple scheduled visits, you may be dismissed from the clinic at the providers discretion.     Again, thank you for choosing [facility], our hope is that these requests will decrease the amount of time that you wait before being seen by our physicians.  ______________________________________________________________________   Should you have questions after your visit to the [facility], please contact our office at [phone] between the hours of 8:30 and 4:30 p.m.    Voicemails left after 4:30p.m will not be returned until the following business day.     For prescription refill requests, please have your pharmacy contact us directly.  Please also try to allow 48 hours for prescription requests.     Please contact the scheduling department for questions regarding scheduling.  For scheduling of procedures such as PET scans, CT scans, MRI, Ultrasound, etc please contact central scheduling at [phone].     Resources For Cancer Patients and Caregivers:    Oncolink.org:  A wonderful resource for patients and healthcare providers for information regarding your disease, ways to tract your treatment, what to expect, etc.      American Cancer Society:  800-227-2345  Can help patients locate various types of support and financial assistance   Cancer Care: 1-800-813-HOPE (4673) Provides financial assistance, online support groups, medication/co-pay assistance.     [place] DSS:  336-641-3447 Where to apply  for food stamps, Medicaid, and utility assistance   Medicare Rights Center: 800-333-4114 Helps people with Medicare understand their rights and benefits, navigate the Medicare system, and secure the quality healthcare they deserve   SCAT: 336-333-6589 [place] [employer]'s shared-ride transportation service for eligible riders who have a disability that prevents them from riding the fixed route bus.     For additional information on assistance programs please contact our social worker:   Abigail Elmore:  [phone]  

## 2018-12-14 ENCOUNTER — Telehealth: Payer: Self-pay | Admitting: *Deleted

## 2018-12-14 ENCOUNTER — Telehealth: Payer: Self-pay

## 2018-12-14 NOTE — Telephone Encounter (Signed)
Patient called.

## 2018-12-14 NOTE — Telephone Encounter (Signed)
Called patient to verify appt. She responded by saying she talked with [facility] RN concerning her treatments this morning, and that she was going to delay treatment at this time , so cancel all appointments. Per 1/9 los. I Levada Dy) Verify conversation with Carlyon Prows

## 2018-12-16 DIAGNOSIS — Z7189 Other specified counseling: Secondary | ICD-10-CM | POA: Insufficient documentation

## 2018-12-16 NOTE — Progress Notes (Signed)
Patient on plan of care prior to pathways. 

## 2018-12-16 NOTE — Progress Notes (Signed)
START OFF PATHWAY REGIMEN - Lymphoma and CLL   [id number]:Rituximab (Weekly):   Administer weekly:     Rituximab   **Always confirm dose/schedule in your pharmacy ordering system**  Patient Characteristics: Chronic Lymphocytic Leukemia (CLL), First Line, Treatment Indicated, Not a Candidate for BTK Inhibitor, 17p del (-) or Unknown, Fit Patient, Age ? 65 Disease Type: Chronic Lymphocytic Leukemia (CLL) Disease Type: Not Applicable Disease Type: Not Applicable Line of Therapy: First Line RAI Stage: IV Treatment Indicated<= Treatment Indicated 17p Deletion Status: Negative Fit or Frail Patient<= Fit Patient Patient Age: Age ? 69 Intent of Therapy: Non-Curative / Palliative Intent, Discussed with Patient

## 2018-12-18 ENCOUNTER — Other Ambulatory Visit: Payer: Self-pay | Admitting: Adult Health

## 2018-12-18 NOTE — Progress Notes (Signed)
Assessment and Plan:   Essential hypertension If any CP, SOB, HA, dizziness, etc go to ER, patient expressed understands -     CBC with Differential/Platelet -     BASIC METABOLIC PANEL WITH GFR -     Hepatic function panel -     TSH  Mixed hyperlipidemia -continue medications, check lipids, decrease fatty foods, increase activity.  -     Lipid panel  CLL (chronic lymphocytic leukemia) ([place]) Continue follow up oncology  Morbid Obesity with co morbidities - long discussion about weight loss, diet, and exercise  Prediabetes -     Hemoglobin A1c  Diarrhea Improving, needs note for work, benign AB If worse follow up  Back pain Follow up ortho Suggest water therapy  Continue diet and meds as discussed. Further disposition pending results of labs. OVER 30 minutes of exam, counseling, chart review, referral performed   HPI 73 y.o. female  presents for over due 3 month follow up - follows up with hypertension, hyperlipidemia, prediabetes, Vitamin D, and complicated history of CLL.   Due to [name] and immunodef, she has chronic fatigue and states she does better if she can work [employer], [employer], Friday and feels better if she has rest Wednesday. She works as [employer], takes calls, sits all day but can be very stressful. Most recently she has been sick x Sunday, her husband has the same thing. She has had diarrhea, headache and weakness, she has been out of work x Monday. She did not have diarrhea today but continues with weakness/HA. Had WBC checked the 9th and went down from 126.2 to 66.5.   Patient was dx'd with CLL in Dec 2009 and been monitored by Dr. Irene Limbo. She could not tolerate the IVIG.  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 She has been having fatigue, night sweats. She is going to start IV rituximab, once a day for 4 weeks but she states that after reading the side effects she would not like to. She is going to follow up with Dr. Melanee Spry on the 27th.   Lab Results  Component  Value Date   WBC 66.5 (HH) 12/13/2018   HGB 11.4 (L) 12/13/2018   HCT 34.8 (L) 12/13/2018   MCV 96.7 12/13/2018   PLT 78 (L) 12/13/2018    She follows with Dr. Silverio Decamp for back pain,  She had left L4-5 extraforaminal decompression and microdiscectomy in Nov with Dr. Ronnald Ramp. Has follow up. Suggest water therapy, can go on Wednesday or after 430.    She does not workout. She denies chest pain, shortness of breath, dizziness.  She is on cholesterol medication and denies myalgias. Her cholesterol is at goal. The cholesterol last visit was:   Lab Results  Component Value Date   CHOL 202 (H) 08/22/2018   HDL 67 08/22/2018   LDLCALC 117 (H) 08/22/2018   TRIG 83 08/22/2018   CHOLHDL 3.0 08/22/2018    She has been working on diet and exercise for prediabetes, and denies paresthesia of the feet, polydipsia, polyuria and visual disturbances. Last A1C in the office was:  Lab Results  Component Value Date   HGBA1C 5.5 08/22/2018   Patient is on Vitamin D supplement.   Lab Results  Component Value Date   VD25OH 88 08/22/2018     BMI is Body mass index is 36.8 kg/m., she is working on diet and exercise.+ OSA on CPAP.  Wt Readings from Last 3 Encounters:  12/19/18 201 lb 3.2 oz (91.3 kg)  12/13/18  199 lb 11.2 oz (90.6 kg)  11/14/18 196 lb 12.8 oz (89.3 kg)   Current Medications:  Current Outpatient Medications on File Prior to Visit  Medication Sig  . ALPRAZolam (XANAX) 1 MG tablet TAKE 1/2 TO 1 TABLET BY MOUTH THREE TIMES A DAY AS NEEDED  . Ascorbic Acid (VITAMIN C) 1000 MG tablet Take 1,500 mg by mouth daily.   . bisoprolol-hydrochlorothiazide (ZIAC) 10-6.25 MG tablet TAKE ONE TABLET BY MOUTH DAILY  . Cholecalciferol (D 2000) 2000 units TABS Take 10,000 Units by mouth daily.   . DiphenhydrAMINE HCl (BENADRYL ALLERGY PO) Take by mouth. Take 2 tablets daily  . escitalopram (LEXAPRO) 10 MG tablet TAKE ONE TABLET BY MOUTH DAILY FOR MOOD  . ezetimibe (ZETIA) 10 MG tablet TAKE ONE TABLET BY MOUTH  DAILY  . famotidine (PEPCID) 40 MG tablet Take 1 tablet daily for Heartburn & Reflux  . fexofenadine (ALLEGRA) 180 MG tablet Take 1 tablet (180 mg total) by mouth daily. Take PRN for allergies.  Vladimir Faster Glycol-Propyl Glycol (SYSTANE OP) Apply 2 drops daily to eye.  . Probiotic Product (PROBIOTIC PO) Take 1 tablet daily by mouth.   No current facility-administered medications on file prior to visit.    Medical History:  Past Medical History:  Diagnosis Date  . Anemia   . Anxiety   . Arthritis   . CLL (chronic lymphocytic leukemia) ([place]) dx'd ~ 2015  . Depression   . GERD (gastroesophageal reflux disease)   . Headache    "sinus headaches"  . History of kidney stones   . Hypertension   . OSA (obstructive sleep apnea)   . Pneumonia   . Prediabetes   . Recurrent sinus infections 08/02/2012   Allergies:  Allergies  Allergen Reactions  . Hyzaar [Losartan Potassium-Hctz] Other (See Comments)    "messed up my sodium counts" swells up lips.  [name] [Pseudoephedrine Hcl] Palpitations  . Levaquin [Levofloxacin In D5w] Diarrhea and Nausea Only  . Acyclovir And Related   . Biaxin [Clarithromycin]     GI Upset  . Celexa [Citalopram]   . Flexeril [Cyclobenzaprine]     "zombie-like" feeling  . Fosamax [Alendronate Sodium]     GI upset  . Iohexol Hives     Code: HIVES, Desc: pt states she broke out in hive 20 yrs ago from IV contrast.     . Losartan     Angioedema  . Meloxicam     GI upset  . Norvasc [Amlodipine] Swelling  . Pseudoephedrine     Palpitations  . Zoloft [Sertraline Hcl] Other (See Comments)    Has no emotions at all      Review of Systems:  Review of Systems  Constitutional: Positive for malaise/fatigue. Negative for chills, diaphoresis, fever and weight loss.  HENT: Positive for congestion and sinus pain. Negative for ear discharge, ear pain, hearing loss, nosebleeds, sore throat and tinnitus.        + TMJ  Eyes: Negative.  Negative for blurred vision  and double vision.  Respiratory: Positive for cough and shortness of breath. Negative for hemoptysis, sputum production, wheezing and stridor.   Cardiovascular: Positive for chest pain (pleuritc), orthopnea and leg swelling. Negative for palpitations, claudication and PND.  Gastrointestinal: Positive for abdominal pain, constipation, diarrhea and heartburn. Negative for blood in stool, melena, nausea and vomiting.  Genitourinary: Negative for dysuria, flank pain, frequency, hematuria and urgency.  Musculoskeletal: Positive for joint pain (left shoulder). Negative for back pain, falls, myalgias and neck pain.  Skin: Negative.   Neurological: Negative for dizziness, tingling, tremors, sensory change, speech change, focal weakness, seizures, loss of consciousness, weakness and headaches.  Endo/Heme/Allergies: Negative for environmental allergies and polydipsia. Does not bruise/bleed easily.  Psychiatric/Behavioral: Negative for depression, hallucinations, memory loss, substance abuse and suicidal ideas. The patient is nervous/anxious. The patient does not have insomnia.     Family history- Review and unchanged Social history- Review and unchanged Physical Exam: BP 132/78   Pulse 73   Temp 97.7 F (36.5 C)   Ht 5\' 2"  (1.575 m)   Wt 201 lb 3.2 oz (91.3 kg)   SpO2 98%   BMI 36.80 kg/m  Wt Readings from Last 3 Encounters:  12/19/18 201 lb 3.2 oz (91.3 kg)  12/13/18 199 lb 11.2 oz (90.6 kg)  11/14/18 196 lb 12.8 oz (89.3 kg)   General Appearance: Well nourished, in no apparent distress, no diaphroesis Eyes: PERRLA, EOMs, conjunctiva no swelling or erythema Sinuses: + Frontal/maxillary tenderness ENT/Mouth: Ext aud canals clear, TMs without erythema, bulging. No erythema, swelling, or exudate on post pharynx.  Tonsils not swollen or erythematous. Hearing normal. + TMJ tenderness Neck: Supple, thyroid normal.  Respiratory: Respiratory effort normal, BS equal bilaterally without rales,  rhonchi, wheezing or stridor.  Cardio: RRR with no MRGs. Brisk peripheral pulses without edema.  Abdomen: Soft, + BS, obese, diffusely overly tender to palpation, no peritoneal signs, + guarding, without rebound, hernias, masses. Lymphatics: Non tender without lymphadenopathy.  Musculoskeletal: Full ROM, 5/5 strength, Normal gait, Strength is normal and symmetric in arms. Skin:  Warm, dry without rashes, lesions, ecchymosis.  Neuro: Cranial nerves intact. Normal muscle tone, no cerebellar symptoms. Psych: Awake and oriented X 3, normal affect, appears anxious, Insight and Judgment appropriate.    Vicie Mutters, PA-C 4:24 PM [facility]

## 2018-12-19 ENCOUNTER — Ambulatory Visit: Payer: 59 | Admitting: Physician Assistant

## 2018-12-19 ENCOUNTER — Encounter: Payer: Self-pay | Admitting: Physician Assistant

## 2018-12-19 ENCOUNTER — Ambulatory Visit: Payer: 59 | Admitting: Hematology

## 2018-12-19 ENCOUNTER — Ambulatory Visit: Payer: Self-pay | Admitting: Physician Assistant

## 2018-12-19 ENCOUNTER — Other Ambulatory Visit: Payer: 59

## 2018-12-19 VITALS — BP 132/78 | HR 73 | Temp 97.7°F | Ht 62.0 in | Wt 201.2 lb

## 2018-12-19 DIAGNOSIS — F325 Major depressive disorder, single episode, in full remission: Secondary | ICD-10-CM

## 2018-12-19 DIAGNOSIS — C9110 Chronic lymphocytic leukemia of B-cell type not having achieved remission: Secondary | ICD-10-CM

## 2018-12-19 DIAGNOSIS — I10 Essential (primary) hypertension: Secondary | ICD-10-CM | POA: Diagnosis not present

## 2018-12-19 DIAGNOSIS — Z79899 Other long term (current) drug therapy: Secondary | ICD-10-CM

## 2018-12-19 DIAGNOSIS — E782 Mixed hyperlipidemia: Secondary | ICD-10-CM

## 2018-12-19 MED ORDER — HYOSCYAMINE SULFATE SL 0.125 MG SL SUBL: 0.1250 mg | SUBLINGUAL_TABLET | SUBLINGUAL | 3 refills | Status: DC | PRN

## 2018-12-19 NOTE — Patient Instructions (Addendum)
Please cut the lexapro in half to 5 mg a day and add on the wellbutrin  If you still have twitching please stop  Please be aware that some of the medications that you are on can sometimes cause a rare and potentially dangerous adverse reaction, called SEROTONIN SYNDROME: Symptoms of this condition include (but are not limited to):  Agitation or restlessness, confusion, rapid heart rate and high blood pressure, dilated pupils, loss of muscle coordination or twitching muscles, muscle rigidity/stiffness, sweating and/or flushing, diarrhea, headache, shivering, goose bumps. If you have any of these symptoms you may have to stop the medication. Call your health care provider immediately.  Severe serotonin syndrome can be life-threatening emergency. Signs and symptoms of a severe reaction may include: high fever, seizures, irregular heartbeat, unconsciousness or altered level of awareness or personality changes.  If you have any of these new symptoms, call 911 or have someone take you to the emergency room.   Can try the vitamin C WITH food and can try to separate the capsules OR can stop x 1 week and see if this helps the diarrhea Will send in levsin for you  Try the melatonin 5mg -20mg  dissolvable or gummy 30 mins before bed Unisom for sleep  11 Tips to Follow:  1. No caffeine after 3pm: Avoid beverages with caffeine (soda, tea, energy drinks, etc.) especially after 3pm. 2. Don't go to bed hungry: Have your evening meal at least 3 hrs. before going to sleep. It's fine to have a small bedtime snack such as a glass of milk and a few crackers but don't have a big meal. 3. Have a nightly routine before bed: Plan on "winding down" before you go to sleep. Begin relaxing about 1 hour before you go to bed. Try doing a quiet activity such as listening to calming music, reading a book or meditating. 4. Turn off the TV and ALL electronics including video games, tablets, laptops, etc. 1 hour before sleep, and  keep them out of the bedroom. 5. Turn off your cell phone and all notifications (new email and text alerts) or even better, leave your phone outside your room while you sleep. Studies have shown that a part of your brain continues to respond to certain lights and sounds even while you're still asleep. 6. Make your bedroom quiet, dark and cool. If you can't control the noise, try wearing earplugs or using a fan to block out other sounds. 7. Practice relaxation techniques. Try reading a book or meditating or drain your brain by writing a list of what you need to do the next day. 8. Don't nap unless you feel sick: you'll have a better night's sleep. 9. Don't smoke, or quit if you do. Nicotine, alcohol, and marijuana can all keep you awake. Talk to your health care provider if you need help with substance use. 10. Most importantly, wake up at the same time every day (or within 1 hour of your usual wake up time) EVEN on the weekends. A regular wake up time promotes sleep hygiene and prevents sleep problems. 11. Reduce exposure to bright light in the last three hours of the day before going to sleep.   Maintaining good sleep hygiene and having good sleep habits lower your risk of developing sleep problems. Getting better sleep can also improve your concentration and alertness. Try the simple steps in this guide. If you still have trouble getting enough rest, make an appointment with your health care provider.   I think it  is possible that you have sleep apnea. It can cause interrupted sleep, headaches, frequent awakenings, fatigue, dry mouth, fast/slow heart beats, memory issues, anxiety/depression, swelling, numbness tingling hands/feet, weight gain, shortness of breath, and the list goes on. Sleep apnea needs to be ruled out because if it is left untreated it does eventually lead to abnormal heart beats, lung failure or heart failure as well as increasing the risk of heart attack and stroke. There are masks  you can wear OR a mouth piece that I can give you information about. Often times though people feel MUCH better after getting treatment.   Sleep Apnea  Sleep apnea is a sleep disorder characterized by abnormal pauses in breathing while you sleep. When your breathing pauses, the level of oxygen in your blood decreases. This causes you to move out of deep sleep and into light sleep. As a result, your quality of sleep is poor, and the system that carries your blood throughout your body (cardiovascular system) experiences stress. If sleep apnea remains untreated, the following conditions can develop:  High blood pressure (hypertension).  Coronary artery disease.  Inability to achieve or maintain an erection (impotence).  Impairment of your thought process (cognitive dysfunction). There are three types of sleep apnea: 1. Obstructive sleep apnea--Pauses in breathing during sleep because of a blocked airway. 2. Central sleep apnea--Pauses in breathing during sleep because the area of the brain that controls your breathing does not send the correct signals to the muscles that control breathing. 3. Mixed sleep apnea--A combination of both obstructive and central sleep apnea.  RISK FACTORS The following risk factors can increase your risk of developing sleep apnea:  Being overweight.  Smoking.  Having narrow passages in your nose and throat.  Being of older age.  Being female.  Alcohol use.  Sedative and tranquilizer use.  Ethnicity. Among individuals younger than 35 years, African Americans are at increased risk of sleep apnea. SYMPTOMS   Difficulty staying asleep.  Daytime sleepiness and fatigue.  Loss of energy.  Irritability.  Loud, heavy snoring.  Morning headaches.  Trouble concentrating.  Forgetfulness.  Decreased interest in sex. DIAGNOSIS  In order to diagnose sleep apnea, your caregiver will perform a physical examination. Your caregiver may suggest that you take a  home sleep test. Your caregiver may also recommend that you spend the night in a sleep lab. In the sleep lab, several monitors record information about your heart, lungs, and brain while you sleep. Your leg and arm movements and blood oxygen level are also recorded. TREATMENT The following actions may help to resolve mild sleep apnea:  Sleeping on your side.   Using a decongestant if you have nasal congestion.   Avoiding the use of depressants, including alcohol, sedatives, and narcotics.   Losing weight and modifying your diet if you are overweight. There also are devices and treatments to help open your airway:  Oral appliances. These are custom-made mouthpieces that shift your lower jaw forward and slightly open your bite. This opens your airway.  Devices that create positive airway pressure. This positive pressure "splints" your airway open to help you breathe better during sleep. The following devices create positive airway pressure:  Continuous positive airway pressure (CPAP) device. The CPAP device creates a continuous level of air pressure with an air pump. The air is delivered to your airway through a mask while you sleep. This continuous pressure keeps your airway open.  Nasal expiratory positive airway pressure (EPAP) device. The EPAP device creates positive  air pressure as you exhale. The device consists of single-use valves, which are inserted into each nostril and held in place by adhesive. The valves create very little resistance when you inhale but create much more resistance when you exhale. That increased resistance creates the positive airway pressure. This positive pressure while you exhale keeps your airway open, making it easier to breath when you inhale again.  Bilevel positive airway pressure (BPAP) device. The BPAP device is used mainly in patients with central sleep apnea. This device is similar to the CPAP device because it also uses an air pump to deliver  continuous air pressure through a mask. However, with the BPAP machine, the pressure is set at two different levels. The pressure when you exhale is lower than the pressure when you inhale.  Surgery. Typically, surgery is only done if you cannot comply with less invasive treatments or if the less invasive treatments do not improve your condition. Surgery involves removing excess tissue in your airway to create a wider passage way. Document Released: 11/11/2002 Document Revised: 03/18/2013 Document Reviewed: 03/29/2012 [facility] Patient Information 2015 [place], [place]. This information is not intended to replace advice given to you by your health care provider. Make sure you discuss any questions you have with your health care provider.

## 2018-12-21 ENCOUNTER — Other Ambulatory Visit: Payer: 59

## 2018-12-24 ENCOUNTER — Ambulatory Visit (INDEPENDENT_AMBULATORY_CARE_PROVIDER_SITE_OTHER): Payer: 59 | Admitting: Adult Health

## 2018-12-24 ENCOUNTER — Encounter: Payer: Self-pay | Admitting: Adult Health

## 2018-12-24 VITALS — BP 132/82 | HR 59 | Temp 97.5°F | Ht 62.0 in | Wt 201.8 lb

## 2018-12-24 DIAGNOSIS — R197 Diarrhea, unspecified: Secondary | ICD-10-CM

## 2018-12-24 MED ORDER — ONDANSETRON HCL 4 MG PO TABS: 4.0000 mg | ORAL_TABLET | Freq: Three times a day (TID) | ORAL | 0 refills | Status: DC | PRN

## 2018-12-24 NOTE — Patient Instructions (Signed)
go to the ER if you have any melena (black stool), hematochezia (blood in stool), coffee ground vomiting, severe pain, severe shortness of breath or chest pain.   STOP imodium until you get a stool sample  Call back if not improving by Wednesday  Diarrhea, Adult Diarrhea is frequent loose and watery bowel movements. Diarrhea can make you feel weak and cause you to become dehydrated. Dehydration can make you tired and thirsty, cause you to have a dry mouth, and decrease how often you urinate. Diarrhea typically lasts 2-3 days. However, it can last longer if it is a sign of something more serious. It is important to treat your diarrhea as told by your health care provider. Follow these instructions at home: Eating and drinking     Follow these recommendations as told by your health care provider:  Take an oral rehydration solution (ORS). This is an over-the-counter medicine that helps return your body to its normal balance of nutrients and water. It is found at pharmacies and retail stores.  Drink plenty of fluids, such as water, ice chips, diluted fruit juice, and low-calorie sports drinks. You can drink milk also, if desired.  Avoid drinking fluids that contain a lot of sugar or caffeine, such as energy drinks, sports drinks, and soda.  Eat bland, easy-to-digest foods in small amounts as you are able. These foods include bananas, applesauce, rice, lean meats, toast, and crackers.  Avoid alcohol.  Avoid spicy or fatty foods.  Medicines  Take over-the-counter and prescription medicines only as told by your health care provider.  If you were prescribed an antibiotic medicine, take it as told by your health care provider. Do not stop using the antibiotic even if you start to feel better. General instructions   Wash your hands often using soap and water. If soap and water are not available, use a hand sanitizer. Others in the household should wash their hands as well. Hands should be  washed: ? After using the toilet or changing a diaper. ? Before preparing, cooking, or serving food. ? While caring for a sick person or while visiting someone in a hospital.  Drink enough fluid to keep your urine pale yellow.  Rest at home while you recover.  Watch your condition for any changes.  Take a warm bath to relieve any burning or pain from frequent diarrhea episodes.  Keep all follow-up visits as told by your health care provider. This is important. Contact a health care provider if:  You have a fever.  Your diarrhea gets worse.  You have new symptoms.  You cannot keep fluids down.  You feel light-headed or dizzy.  You have a headache.  You have muscle cramps. Get help right away if:  You have chest pain.  You feel extremely weak or you faint.  You have bloody or black stools or stools that look like tar.  You have severe pain, cramping, or bloating in your abdomen.  You have trouble breathing or you are breathing very quickly.  Your heart is beating very quickly.  Your skin feels cold and clammy.  You feel confused.  You have signs of dehydration, such as: ? Dark urine, very little urine, or no urine. ? Cracked lips. ? Dry mouth. ? Sunken eyes. ? Sleepiness. ? Weakness. Summary  Diarrhea is frequent loose and watery bowel movements. Diarrhea can make you feel weak and cause you to become dehydrated.  Drink enough fluids to keep your urine pale yellow.  Make sure that  you wash your hands after using the toilet. If soap and water are not available, use hand sanitizer.  Contact a health care provider if your diarrhea gets worse or you have new symptoms.  Get help right away if you have signs of dehydration. This information is not intended to replace advice given to you by your health care provider. Make sure you discuss any questions you have with your health care provider. Document Released: 11/11/2002 Document Revised: 04/27/2018 Document  Reviewed: 04/27/2018 Elsevier Interactive Patient Education  2019 [place] Choices to Help Relieve Diarrhea, Adult When you have diarrhea, the foods you eat and your eating habits are very important. Choosing the right foods and drinks can help:  Relieve diarrhea.  Replace lost fluids and nutrients.  Prevent dehydration. What general guidelines should I follow?  Relieving diarrhea  Choose foods with less than 2 g or .07 oz. of fiber per serving.  Limit fats to less than 8 tsp (38 g or 1.34 oz.) a day.  Avoid the following: ? Foods and beverages sweetened with high-fructose corn syrup, honey, or sugar alcohols such as xylitol, sorbitol, and mannitol. ? Foods that contain a lot of fat or sugar. ? Fried, greasy, or spicy foods. ? High-fiber grains, breads, and cereals. ? Raw fruits and vegetables.  Eat foods that are rich in probiotics. These foods include dairy products such as yogurt and fermented milk products. They help increase healthy bacteria in the stomach and intestines (gastrointestinal tract, or GI tract).  If you have lactose intolerance, avoid dairy products. These may make your diarrhea worse.  Take medicine to help stop diarrhea (antidiarrheal medicine) only as told by your health care provider. Replacing nutrients  Eat small meals or snacks every 3-4 hours.  Eat bland foods, such as white rice, toast, or baked potato, until your diarrhea starts to get better. Gradually reintroduce nutrient-rich foods as tolerated or as told by your health care provider. This includes: ? Well-cooked protein foods. ? Peeled, seeded, and soft-cooked fruits and vegetables. ? Low-fat dairy products.  Take vitamin and mineral supplements as told by your health care provider. Preventing dehydration  Start by sipping water or a special solution to prevent dehydration (oral rehydration solution, ORS). Urine that is clear or pale yellow means that you are getting enough  fluid.  Try to drink at least 8-10 cups of fluid each day to help replace lost fluids.  You may add other liquids in addition to water, such as clear juice or decaffeinated sports drinks, as tolerated or as told by your health care provider.  Avoid drinks with caffeine, such as coffee, tea, or soft drinks.  Avoid alcohol. What foods are recommended?     The items listed may not be a complete list. Talk with your health care provider about what dietary choices are best for you. Grains White rice. White, [place], or pita breads (fresh or toasted), including plain rolls, buns, or bagels. White pasta. Saltine, soda, or graham crackers. Pretzels. Low-fiber cereal. Cooked cereals made with water (such as cornmeal, farina, or cream cereals). Plain muffins. Matzo. Melba toast. Zwieback. Vegetables Potatoes (without the skin). Most well-cooked and canned vegetables without skins or seeds. Tender lettuce. Fruits Apple sauce. Fruits canned in juice. Cooked apricots, cherries, grapefruit, peaches, pears, or plums. Fresh bananas and cantaloupe. Meats and other protein foods Baked or boiled chicken. Eggs. Tofu. Fish. Seafood. Smooth nut butters. Ground or well-cooked tender beef, ham, veal, lamb, pork, or poultry. Dairy Plain yogurt, kefir,  and unsweetened liquid yogurt. Lactose-free milk, buttermilk, skim milk, or soy milk. Low-fat or nonfat hard cheese. Beverages Water. Low-calorie sports drinks. Fruit juices without pulp. Strained tomato and vegetable juices. Decaffeinated teas. Sugar-free beverages not sweetened with sugar alcohols. Oral rehydration solutions, if approved by your health care provider. Seasoning and other foods Bouillon, broth, or soups made from recommended foods. What foods are not recommended? The items listed may not be a complete list. Talk with your health care provider about what dietary choices are best for you. Grains Whole grain, whole wheat, bran, or rye breads, rolls,  pastas, and crackers. Wild or brown rice. Whole grain or bran cereals. Barley. Oats and oatmeal. Corn tortillas or taco shells. Granola. Popcorn. Vegetables Raw vegetables. Fried vegetables. Cabbage, broccoli, Brussels sprouts, artichokes, baked beans, beet greens, corn, kale, legumes, peas, sweet potatoes, and yams. Potato skins. Cooked spinach and cabbage. Fruits Dried fruit, including raisins and dates. Raw fruits. Stewed or dried prunes. Canned fruits with syrup. Meat and other protein foods Fried or fatty meats. Deli meats. Chunky nut butters. Nuts and seeds. Beans and lentils. Berniece Salines. Hot dogs. Sausage. Dairy High-fat cheeses. Whole milk, chocolate milk, and beverages made with milk, such as milk shakes. Half-and-half. Cream. sour cream. Ice cream. Beverages Caffeinated beverages (such as coffee, tea, soda, or energy drinks). Alcoholic beverages. Fruit juices with pulp. Prune juice. Soft drinks sweetened with high-fructose corn syrup or sugar alcohols. High-calorie sports drinks. Fats and oils Butter. Cream sauces. Margarine. Salad oils. Plain salad dressings. Olives. Avocados. Mayonnaise. Sweets and desserts Sweet rolls, doughnuts, and sweet breads. Sugar-free desserts sweetened with sugar alcohols such as xylitol and sorbitol. Seasoning and other foods Honey. Hot sauce. Chili powder. Gravy. Cream-based or milk-based soups. Pancakes and waffles. Summary  When you have diarrhea, the foods you eat and your eating habits are very important.  Make sure you get at least 8-10 cups of fluid each day, or enough to keep your urine clear or pale yellow.  Eat bland foods and gradually reintroduce healthy, nutrient-rich foods as tolerated, or as told by your health care provider.  Avoid high-fiber, fried, greasy, or spicy foods. This information is not intended to replace advice given to you by your health care provider. Make sure you discuss any questions you have with your health care  provider. Document Released: 02/11/2004 Document Revised: 11/18/2016 Document Reviewed: 11/18/2016 Elsevier Interactive Patient Education  2019 Elsevier Inc.    Ondansetron tablets What is this medicine? ONDANSETRON (on DAN se tron) is used to treat nausea and vomiting caused by chemotherapy. It is also used to prevent or treat nausea and vomiting after surgery. This medicine may be used for other purposes; ask your health care provider or pharmacist if you have questions. COMMON BRAND NAME(S): Zofran What should I tell my health care provider before I take this medicine? They need to know if you have any of these conditions: -heart disease -history of irregular heartbeat -liver disease -low levels of magnesium or potassium in the blood -an unusual or allergic reaction to ondansetron, granisetron, other medicines, foods, dyes, or preservatives -pregnant or trying to get pregnant -breast-feeding How should I use this medicine? Take this medicine by mouth with a glass of water. Follow the directions on your prescription label. Take your doses at regular intervals. Do not take your medicine more often than directed. Talk to your pediatrician regarding the use of this medicine in children. Special care may be needed. Overdosage: If you think you have taken too much  of this medicine contact a poison control center or emergency room at once. NOTE: This medicine is only for you. Do not share this medicine with others. What if I miss a dose? If you miss a dose, take it as soon as you can. If it is almost time for your next dose, take only that dose. Do not take double or extra doses. What may interact with this medicine? Do not take this medicine with any of the following medications: -apomorphine -certain medicines for fungal infections like fluconazole, itraconazole, ketoconazole, posaconazole, voriconazole -cisapride -dofetilide -dronedarone -pimozide -thioridazine -ziprasidone This  medicine may also interact with the following medications: -carbamazepine -certain medicines for depression, anxiety, or psychotic disturbances -fentanyl -linezolid -MAOIs like Carbex, Eldepryl, Marplan, Nardil, and Parnate -methylene blue (injected into a vein) -other medicines that prolong the QT interval (cause an abnormal heart rhythm) -phenytoin -rifampicin -tramadol This list may not describe all possible interactions. Give your health care provider a list of all the medicines, herbs, non-prescription drugs, or dietary supplements you use. Also tell them if you smoke, drink alcohol, or use illegal drugs. Some items may interact with your medicine. What should I watch for while using this medicine? Check with your doctor or health care professional right away if you have any sign of an allergic reaction. What side effects may I notice from receiving this medicine? Side effects that you should report to your doctor or health care professional as soon as possible: -allergic reactions like skin rash, itching or hives, swelling of the face, lips or tongue -breathing problems -confusion -dizziness -fast or irregular heartbeat -feeling faint or lightheaded, falls -fever and chills -loss of balance or coordination -seizures -sweating -swelling of the hands or feet -tightness in the chest -tremors -unusually weak or tired Side effects that usually do not require medical attention (report to your doctor or health care professional if they continue or are bothersome): -constipation or diarrhea -headache This list may not describe all possible side effects. Call your doctor for medical advice about side effects. You may report side effects to FDA at 1-800-FDA-1088. Where should I keep my medicine? Keep out of the reach of children. Store between 2 and 30 degrees C (36 and 86 degrees F). Throw away any unused medicine after the expiration date. NOTE: This sheet is a summary. It may not  cover all possible information. If you have questions about this medicine, talk to your doctor, pharmacist, or health care provider.  2019 Elsevier/Gold Standard (2013-08-28 16:27:45)

## 2018-12-24 NOTE — Progress Notes (Signed)
Assessment and Plan:  Rebecca Bond was seen today for diarrhea and fatigue.  Diagnoses and all orders for this visit:  Diarrhea of presumed infectious origin Persistent; will check basic labs and stool for infectious etiology - presume due to husband having similar prior, low suspicion of C. Diff but will r/o Continue pushing hydration, bland foods recommended, supplement pedialyte, gatorade if continuing to have episodes Will refer to GI for evaluation if persisting another several days without resolution or clear etiology Will go to the ER if they have any melena (black stool), hematochezia (blood in stool), coffee ground vomiting, severe pain, severe shortness of breath or chest pain.  She is in agreement with plan -     Gastrointestinal Pathogen Panel PCR -     Cancel: C. difficile GDH and Toxin A/B; Future -     CBC with Differential/Platelet -     COMPLETE METABOLIC PANEL WITH GFR -     Clostridium difficile Toxin B, Qualitative, Real-Time PCR([facility])  Other orders -     ondansetron (ZOFRAN) 4 MG tablet; Take 1 tablet (4 mg total) by mouth every 8 (eight) hours as needed for nausea or vomiting.   Further disposition pending results of labs. Discussed med's effects and SE's.   Over 15 minutes of exam, counseling, chart review, and critical decision making was performed.   Future Appointments  Date Time Provider [place]  12/26/2018  3:15 PM Vicie Mutters, PA-C [facility] None  04/10/2019  4:00 PM Vicie Mutters, PA-C [facility] None  09/30/2019  3:00 PM [name], MD [facility] None    ------------------------------------------------------------------------------------------------------------------   HPI 73 y.o.female with GERD, presently untreated CLL presents for evaluation of persistent GI symptoms; she reports this initially began 8 days ago with nausea, HA, profuse watery diarrhea. Did not have emesis at that time. Denies fever/chills. She reports symptoms were  improving but didn't resolve, and continues to have episodes of watery diarrhea, has had 2 episodes today so far. Has fatigue that is ongoing. She reports ongoing intermittent lower abdominal cramping associated with diarrhea. She has been unable to go to work due to these symptoms.   She denies any hematochezia, melena  She is able to keep down fluids and not much appetite but does keep down food.   She does endorse ongoing nausea, taking hyoscyamine sublingually. She has taken some imodium, wasn't helping last week but seems to be helping now.   She reports husband had similar symptoms that began 1 day prior to her symptoms. Denies recent travel. She report several coworkers have had similar.   She denies recent antibiotics, none in last several months.    CBC Latest Ref Rng & Units 12/13/2018 11/14/2018 09/19/2018  WBC 4.0 - 10.5 K/uL 66.5(HH) 126.2(H) 87.2(HH)  Hemoglobin 12.0 - 15.0 g/dL 11.4(L) 9.5(L) 11.0(L)  Hematocrit 36.0 - 46.0 % 34.8(L) 31.0(L) 34.2(L)  Platelets 150 - 400 K/uL 78(L) 190 86(L)      CT abd/pelvis 11/14/2016:  1. No acute abnormalities within the abdomen or pelvis. 2. Mild splenomegaly and several prominent to mildly enlarged pelvic and inguinal lymph nodes. These findings are similar to the prior CT. 3. Small nonobstructing stone in the lower pole the right kidney. No ureteral stones or obstructive uropathy.  Colonoscopy 2017:  - The examined portion of the ileum was normal. - Two 3 to 7 mm polyps in the cecum, removed with a cold snare. Resected and retrieved. - One 2 mm polyp in the cecum, removed with a cold biopsy forceps.  Resected and retrieved. - Two 5 to 6 mm polyps in the sigmoid colon, removed with a cold snare. Resected and retrieved. - The examination was otherwise normal on direct and retroflexion views. - Biopsies were taken with a cold forceps from the right colon and left colon for evaluation of microscopic colitis.    Past Medical  History:  Diagnosis Date  . Anemia   . Anxiety   . Arthritis   . CLL (chronic lymphocytic leukemia) ([place]) dx'd ~ 2015  . Depression   . GERD (gastroesophageal reflux disease)   . Headache    "sinus headaches"  . History of kidney stones   . Hypertension   . OSA (obstructive sleep apnea)   . Pneumonia   . Prediabetes   . Recurrent sinus infections 08/02/2012     Allergies  Allergen Reactions  . Hyzaar [Losartan Potassium-Hctz] Other (See Comments)    "messed up my sodium counts" swells up lips.  [name] [Pseudoephedrine Hcl] Palpitations  . Levaquin [Levofloxacin In D5w] Diarrhea and Nausea Only  . Acyclovir And Related   . Biaxin [Clarithromycin]     GI Upset  . Celexa [Citalopram]   . Flexeril [Cyclobenzaprine]     "zombie-like" feeling  . Fosamax [Alendronate Sodium]     GI upset  . Iohexol Hives     Code: HIVES, Desc: pt states she broke out in hive 20 yrs ago from IV contrast.     . Losartan     Angioedema  . Meloxicam     GI upset  . Norvasc [Amlodipine] Swelling  . Pseudoephedrine     Palpitations  . Zoloft [Sertraline Hcl] Other (See Comments)    Has no emotions at all     Current Outpatient Medications on File Prior to Visit  Medication Sig  . ALPRAZolam (XANAX) 1 MG tablet TAKE 1/2 TO 1 TABLET BY MOUTH THREE TIMES A DAY AS NEEDED  . Ascorbic Acid (VITAMIN C) 1000 MG tablet Take 1,500 mg by mouth daily.   . bisoprolol-hydrochlorothiazide (ZIAC) 10-6.25 MG tablet TAKE ONE TABLET BY MOUTH DAILY  . Cholecalciferol (D 2000) 2000 units TABS Take 10,000 Units by mouth daily.   . DiphenhydrAMINE HCl (BENADRYL ALLERGY PO) Take by mouth. Take 2 tablets daily  . escitalopram (LEXAPRO) 10 MG tablet TAKE ONE TABLET BY MOUTH DAILY FOR MOOD  . ezetimibe (ZETIA) 10 MG tablet TAKE ONE TABLET BY MOUTH DAILY  . famotidine (PEPCID) 40 MG tablet Take 1 tablet daily for Heartburn & Reflux  . fexofenadine (ALLEGRA) 180 MG tablet Take 1 tablet (180 mg total) by mouth daily.  Take PRN for allergies.  . Hyoscyamine Sulfate SL (LEVSIN/SL) 0.125 MG SUBL Place 0.125 mg under the tongue every 4 (four) hours as needed (diarrhea, nausea, stomach cramps).  Vladimir Faster Glycol-Propyl Glycol (SYSTANE OP) Apply 2 drops daily to eye.  . Probiotic Product (PROBIOTIC PO) Take 1 tablet daily by mouth.   No current facility-administered medications on file prior to visit.     ROS: all negative except above.   Physical Exam:  BP 132/82   Pulse (!) 59   Temp (!) 97.5 F (36.4 C)   Ht 5\' 2"  (1.575 m)   Wt 201 lb 12.8 oz (91.5 kg)   SpO2 97%   BMI 36.91 kg/m   General Appearance: Well nourished, in no apparent distress. Eyes: PERRLA, EOMs, conjunctiva no swelling or erythema Sinuses: No Frontal/maxillary tenderness ENT/Mouth:No erythema, swelling, or exudate on post pharynx.  Tonsils not swollen  or erythematous. Hearing normal.  Neck: Supple, thyroid normal.  Respiratory: Respiratory effort normal, BS equal bilaterally without rales, rhonchi, wheezing or stridor.  Cardio: RRR with no MRGs. Brisk peripheral pulses without edema.  Abdomen: Firm, non-distended, + BS x 4.  Non tender, no guarding, rebound, hernias, distinct palpable masses.  Lymphatics: Non tender without lymphadenopathy through neck Musculoskeletal: Symmetrical strength, normal gait.  Skin: Warm, dry without rashes, lesions, ecchymosis.  Neuro:  Normal muscle tone, no cerebellar symptoms.  Psych: Awake and oriented X 3, normal affect, Insight and Judgment appropriate.     Izora Ribas, NP 4:20 PM [facility]

## 2018-12-25 LAB — CBC WITH DIFFERENTIAL/PLATELET
Absolute Monocytes: 1426 cells/uL — ABNORMAL HIGH (ref 200–950)
Basophils Absolute: 59 cells/uL (ref 0–200)
Basophils Relative: 0.1 %
EOS ABS: 297 {cells}/uL (ref 15–500)
Eosinophils Relative: 0.5 %
HCT: 34.2 % — ABNORMAL LOW (ref 35.0–45.0)
HEMOGLOBIN: 11.5 g/dL — AB (ref 11.7–15.5)
Lymphs Abs: 54470 cells/uL — ABNORMAL HIGH (ref 850–3900)
MCH: 31.7 pg (ref 27.0–33.0)
MCHC: 33.6 g/dL (ref 32.0–36.0)
MCV: 94.2 fL (ref 80.0–100.0)
MPV: 9.3 fL (ref 7.5–12.5)
Monocytes Relative: 2.4 %
Neutro Abs: 3148 cells/uL (ref 1500–7800)
Neutrophils Relative %: 5.3 %
Platelets: 77 10*3/uL — ABNORMAL LOW (ref 140–400)
RBC: 3.63 10*6/uL — ABNORMAL LOW (ref 3.80–5.10)
RDW: 13.6 % (ref 11.0–15.0)
Total Lymphocyte: 91.7 %
WBC: 59.4 10*3/uL — ABNORMAL HIGH (ref 3.8–10.8)

## 2018-12-25 LAB — COMPLETE METABOLIC PANEL WITH GFR
AG Ratio: 2.1 (calc) (ref 1.0–2.5)
ALT: 14 U/L (ref 6–29)
AST: 20 U/L (ref 10–35)
Albumin: 4 g/dL (ref 3.6–5.1)
Alkaline phosphatase (APISO): 58 U/L (ref 33–130)
BUN: 19 mg/dL (ref 7–25)
CALCIUM: 9.3 mg/dL (ref 8.6–10.4)
CO2: 23 mmol/L (ref 20–32)
Chloride: 102 mmol/L (ref 98–110)
Creat: 0.86 mg/dL (ref 0.60–0.93)
GFR, Est African American: 78 mL/min/{1.73_m2} (ref >=60)
GFR, Est Non African American: 67 mL/min/{1.73_m2} (ref >=60)
Globulin: 1.9 g/dL (calc) (ref 1.9–3.7)
Glucose, Bld: 92 mg/dL (ref 65–99)
Potassium: 3.6 mmol/L (ref 3.5–5.3)
Sodium: 138 mmol/L (ref 135–146)
Total Bilirubin: 0.6 mg/dL (ref 0.2–1.2)
Total Protein: 5.9 g/dL — ABNORMAL LOW (ref 6.1–8.1)

## 2018-12-26 ENCOUNTER — Ambulatory Visit: Payer: Self-pay | Admitting: Physician Assistant

## 2018-12-27 ENCOUNTER — Telehealth: Payer: Self-pay

## 2018-12-27 DIAGNOSIS — R197 Diarrhea, unspecified: Secondary | ICD-10-CM

## 2018-12-27 NOTE — Telephone Encounter (Signed)
-----   Message from Vicie Mutters, [place] sent at 12/27/2018  1:43 PM EST ----- Regarding: RE: out of work Contact: [phone] Take the zofran that ashley gave her, if you are still having diarrhea bring in the samples from the bottles that were given, but I would call GI and get in with them. If your work requires FMLA we can do it for a short time while you are getting worked up.  Estill Bamberg ----- Message ----- From: Elenor Quinones, CMA Sent: 12/27/2018  10:10 AM EST To: Vicie Mutters, PA-C Subject: out of work                                    Pt called to report that she had an out of work note from both [employer], she did try to go back to work yesterday but was sent home due to nausea and vomiting. She has an appt for Monday with someone for her back. But she's asking should she see GI? Also because of this should she do FMLA?  Please advise

## 2018-12-27 NOTE — Telephone Encounter (Signed)
-----   Message from Vicie Mutters, [place] sent at 12/27/2018  1:43 PM EST ----- Regarding: RE: out of work Contact: [phone] Take the zofran that ashley gave her, if you are still having diarrhea bring in the samples from the bottles that were given, but I would call GI and get in with them. If your work requires FMLA we can do it for a short time while you are getting worked up.  Estill Bamberg ----- Message ----- From: Elenor Quinones, CMA Sent: 12/27/2018  10:10 AM EST To: Vicie Mutters, PA-C Subject: out of work                                    Pt called to report that she had an out of work note from both [employer], she did try to go back to work yesterday but was sent home due to nausea and vomiting. She has an appt for Monday with someone for her back. But she's asking should she see GI? Also because of this should she do FMLA?  Please advise

## 2018-12-27 NOTE — Telephone Encounter (Signed)
Pt also aware of to take Zofran and collect stool sample

## 2018-12-27 NOTE — Telephone Encounter (Signed)
Spoke with pt and she states that she would like the short FMLA time.  Unable to get a stool sample at this time per pt.  Pt would like to have an idea about how long you will or can take her out of work. Mrs. Dozier states she just needs a little more time to let this pass. She reports she will call her job in the morning.  Pt also agreed to follow up with GI if need be.

## 2018-12-28 ENCOUNTER — Other Ambulatory Visit: Payer: 59

## 2018-12-28 ENCOUNTER — Ambulatory Visit: Payer: 59 | Admitting: Hematology

## 2018-12-28 NOTE — Telephone Encounter (Signed)
Needs to follow up with GI, part of the requirement for the FMLA. Can do 2 weeks at a time.  Send the paper work

## 2018-12-31 NOTE — Telephone Encounter (Signed)
Patient needs a referral put in for a GI Dr. Patient states she has spoken with her Job and they will fax over the form Pt unable to get stool sample due not being able to hold urine so that was in the cup as well as stool. Will she need to come in and get a stool sample kit Please advise

## 2018-12-31 NOTE — Telephone Encounter (Signed)
Pt informed of referral.  Pt also states that she needs an out of work note for this Monday, pt is afraid she will not be able to return to work this week but just isn't sure as to what to do. Pt states she just can not get fired from her job right now  She states she has an appt with GI in 2wks.

## 2018-12-31 NOTE — Addendum Note (Signed)
Addended by: Vicie Mutters R on: 12/31/2018 12:56 PM   Modules accepted: Orders

## 2019-01-01 ENCOUNTER — Encounter: Payer: Self-pay | Admitting: Physician Assistant

## 2019-01-01 NOTE — Telephone Encounter (Signed)
Pt aware of rx sent and she stated that her husband would be here to pick up the out of work note. Pt transferred to front office in order to schedule 1wk appt.

## 2019-01-01 NOTE — Telephone Encounter (Signed)
Will leave out of work note until the 02/10, will fill out FMLA when we get it. Needs follow up here 1 week, need 30 mins

## 2019-01-04 ENCOUNTER — Other Ambulatory Visit: Payer: 59

## 2019-01-04 ENCOUNTER — Ambulatory Visit: Payer: 59

## 2019-01-07 ENCOUNTER — Telehealth: Payer: Self-pay | Admitting: *Deleted

## 2019-01-07 ENCOUNTER — Telehealth: Payer: Self-pay | Admitting: Hematology

## 2019-01-07 ENCOUNTER — Telehealth: Payer: Self-pay

## 2019-01-07 ENCOUNTER — Encounter: Payer: Self-pay | Admitting: Physician Assistant

## 2019-01-07 NOTE — Telephone Encounter (Signed)
Scheduled appt per 2/3 sch message - pt is aware of appt date and time

## 2019-01-07 NOTE — Telephone Encounter (Signed)
-----   Message from Vicie Mutters, [place] sent at 01/07/2019  8:38 AM EST ----- Regarding: FW: question Contact: [phone] Printed letter ----- Message ----- From: Elenor Quinones, CMA Sent: 01/04/2019   9:43 AM EST To: Vicie Mutters, PA-C Subject: question                                       Lesly Rubenstein from  [facility] [phone])   Re: [place]  Need duration that the patient will be out of work per office note.( please advise)   Thank you!

## 2019-01-07 NOTE — Telephone Encounter (Signed)
Contacted office. Patient had called earlier in month to  cancel her appointments with MD and treatment appointments, pending an appointment to evaluate her back pain. Said she had discussed with Dr. Irene Limbo. She is experiencing back problems and fatigue, and did not want to start Rituxan until she had her back evaluated.  Appointments were cancelled at that time, with understanding that patient would contact office to update and reschedule. Patient called to inform office 01/03/2019 that back evaluation appointment did not occur as scheduled. Patient developed N/V/D due to suspected "bug" that her family had, but her symptoms are continuing into 3rd week now. She has appt for evaluation with [facility] tomorrow. Wanted to know when to return to see Dr. Irene Limbo since the evaluation for her back had to be rescheduled. Per Dr. Irene Limbo, return in 2 months for lab and MD visit. Contacted patient and sent scheduling message. Patient verbalized understanding.

## 2019-01-07 NOTE — Telephone Encounter (Signed)
Spoke with in order to inform pt to pick up out of work note. Letter was placed up front for pick up.

## 2019-01-08 ENCOUNTER — Encounter: Payer: Self-pay | Admitting: Nurse Practitioner

## 2019-01-08 ENCOUNTER — Ambulatory Visit: Payer: 59 | Admitting: Nurse Practitioner

## 2019-01-08 VITALS — BP 132/80 | HR 74 | Ht 62.0 in | Wt 199.0 lb

## 2019-01-08 DIAGNOSIS — R110 Nausea: Secondary | ICD-10-CM

## 2019-01-08 DIAGNOSIS — R197 Diarrhea, unspecified: Secondary | ICD-10-CM | POA: Diagnosis not present

## 2019-01-08 MED ORDER — DICYCLOMINE HCL 10 MG PO CAPS: 10.0000 mg | ORAL_CAPSULE | Freq: Two times a day (BID) | ORAL | 2 refills | Status: DC | PRN

## 2019-01-08 NOTE — Progress Notes (Signed)
Assessment and Plan: :  Diarrhea of presumed infectious origin versus IBD Has had issues with the anticholinergic agents and did not tolerate well, will try a bile acid sequestration since she does not have a gallbladder.  We will try to test her husband since he is having symptoms and she is unable to collect a stool.  If this is negative or does not help she may benefit from another course of xifaxan OR she was given rifaximin 550mg  3 x a day in Jan 25/2018.  She will try to go to work Monday but we will take her out for another week  if she is not able. - has been out x Jan 13th and suppose to return to work 01/14/2019 Suggested to follow up with GI if not better  Rhinorrhea Discussed the importance of avoiding unnecessary antibiotic therapy. Suggested symptomatic OTC remedies. -Allergy pill, flonase, autoinflation, explained no need for ABX at this time.  - if not better 2-4 weeks will refer to ENT Nasal saline spray for congestion. Nasal steroids, allergy pill, oral steroids offered  Follow up 2 weeks    Further disposition pending results of labs. Discussed med's effects and SE's.   Over 15 minutes of exam, counseling, chart review, and critical decision making was performed.   Future Appointments  Date Time Provider [place]  01/09/2019 11:00 AM Vicie Mutters, PA-C [facility] None  03/13/2019  2:00 PM [facility] LAB 2 [facility] None  03/13/2019  2:40 PM Brunetta Genera, MD [facility] None  04/10/2019  4:00 PM Vicie Mutters, PA-C [facility] None  09/30/2019  3:00 PM [name], MD [facility] None    ------------------------------------------------------------------------------------------------------------------   HPI 73 y.o.female with GERD, presently untreated CLL presents for evaluation of persistent GI symptoms, she has an office visit with GI yesterday. Here today due to [facility] for the patient.   She has been out of work since  Jan 13th and  currently out of work until 01/14/2019 due to diarrhea and fatigue.Saw GI, had normal EGD/colonoscopy Dec 2017. Has been seen for IBS-D in the past and recently given dicyclomine but states this is causing dry mouth, unable to tolerate levsin due to fatigue  but stated at GI that her urgency was improving and having soft stools but today she states that [facility] stool scale is a 6.   She continues to have urgency, some cramping, some urgency with fecal incontinence. She does not have a gallbladder. Husband also is still having diarrhea, we may try to get his stool to test for pathogens since she has been unable to collect a sample.    CBC Latest Ref Rng & Units 12/24/2018 12/13/2018 11/14/2018  WBC 3.8 - 10.8 Thousand/uL 59.4(H) 66.5(HH) 126.2(H)  Hemoglobin 11.7 - 15.5 g/dL 11.5(L) 11.4(L) 9.5(L)  Hematocrit 35.0 - 45.0 % 34.2(L) 34.8(L) 31.0(L)  Platelets 140 - 400 Thousand/uL 77(L) 78(L) 190    CT abd/pelvis 11/14/2016:  1. No acute abnormalities within the abdomen or pelvis. 2. Mild splenomegaly and several prominent to mildly enlarged pelvic and inguinal lymph nodes. These findings are similar to the prior CT. 3. Small nonobstructing stone in the lower pole the right kidney. No ureteral stones or obstructive uropathy.  Colonoscopy 2017:  - The examined portion of the ileum was normal. - Two 3 to 7 mm polyps in the cecum, removed with a cold snare. Resected and retrieved. - One 2 mm polyp in the cecum, removed with a cold biopsy forceps. Resected and retrieved. - Two 5 to 6  mm polyps in the sigmoid colon, removed with a cold snare. Resected and retrieved. - The examination was otherwise normal on direct and retroflexion views. - Biopsies were taken with a cold forceps from the right colon and left colon for evaluation of microscopic colitis.    Past Medical History:  Diagnosis Date  . Anemia   . Anxiety   . Arthritis   . 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  Depression   . GERD (gastroesophageal reflux disease)   . Headache    "sinus headaches"  . History of kidney stones   . Hypertension   . OSA (obstructive sleep apnea)   . Pneumonia   . Prediabetes   . Recurrent sinus infections 08/02/2012     Allergies  Allergen Reactions  . Hyzaar [Losartan Potassium-Hctz] Other (See Comments)    "messed up my sodium counts" swells up lips.  [name] [Pseudoephedrine Hcl] Palpitations  . Levaquin [Levofloxacin In D5w] Diarrhea and Nausea Only  . Acyclovir And Related   . Biaxin [Clarithromycin]     GI Upset  . Celexa [Citalopram]   . Flexeril [Cyclobenzaprine]     "zombie-like" feeling  . Fosamax [Alendronate Sodium]     GI upset  . Iohexol Hives     Code: HIVES, Desc: pt states she broke out in hive 20 yrs ago from IV contrast.     . Losartan     Angioedema  . Meloxicam     GI upset  . Norvasc [Amlodipine] Swelling  . Pseudoephedrine     Palpitations  . Zoloft [Sertraline Hcl] Other (See Comments)    Has no emotions at all     Current Outpatient Medications on File Prior to Visit  Medication Sig  . ALPRAZolam (XANAX) 1 MG tablet TAKE 1/2 TO 1 TABLET BY MOUTH THREE TIMES A DAY AS NEEDED  . Ascorbic Acid (VITAMIN C) 1000 MG tablet Take 1,500 mg by mouth daily.   . bisoprolol-hydrochlorothiazide (ZIAC) 10-6.25 MG tablet TAKE ONE TABLET BY MOUTH DAILY  . Cholecalciferol (D 2000) 2000 units TABS Take 10,000 Units by mouth daily.   [name] Kitchen dicyclomine (BENTYL) 10 MG capsule Take 1 capsule (10 mg total) by mouth 2 (two) times daily as needed for spasms.  . DiphenhydrAMINE HCl (BENADRYL ALLERGY PO) Take by mouth. Take 2 tablets daily  . escitalopram (LEXAPRO) 10 MG tablet TAKE ONE TABLET BY MOUTH DAILY FOR MOOD  . ezetimibe (ZETIA) 10 MG tablet TAKE ONE TABLET BY MOUTH DAILY  . famotidine (PEPCID) 40 MG tablet Take 1 tablet daily for Heartburn & Reflux  . fexofenadine (ALLEGRA) 180 MG tablet Take 1 tablet (180 mg total) by mouth daily. Take PRN  for allergies.  . Hyoscyamine Sulfate SL (LEVSIN/SL) 0.125 MG SUBL Place 0.125 mg under the tongue every 4 (four) hours as needed (diarrhea, nausea, stomach cramps).  . ondansetron (ZOFRAN) 4 MG tablet Take 1 tablet (4 mg total) by mouth every 8 (eight) hours as needed for nausea or vomiting.  Vladimir Faster Glycol-Propyl Glycol (SYSTANE OP) Apply 2 drops daily to eye.  . Probiotic Product (PROBIOTIC PO) Take 1 tablet daily by mouth.   No current facility-administered medications on file prior to visit.     ROS: all negative except above.   Physical Exam:  There were no vitals taken for this visit.  General Appearance: Well nourished, in no apparent distress. Eyes: PERRLA, EOMs, conjunctiva no swelling or erythema Sinuses: No Frontal/maxillary tenderness ENT/Mouth:No erythema, swelling, or exudate  on post pharynx.  Tonsils not swollen or erythematous. Hearing normal.  Neck: Supple, thyroid normal.  Respiratory: Respiratory effort normal, BS equal bilaterally without rales, rhonchi, wheezing or stridor.  Cardio: RRR with no MRGs. Brisk peripheral pulses without edema.  Abdomen: Firm, non-distended, + BS x 4.  Non tender, no guarding, rebound, hernias, distinct palpable masses.  Lymphatics: Non tender without lymphadenopathy through neck Musculoskeletal: Symmetrical strength, normal gait.  Skin: Warm, dry without rashes, lesions, ecchymosis.  Neuro:  Normal muscle tone, no cerebellar symptoms.  Psych: Awake and oriented X 3, normal affect, Insight and Judgment appropriate.     Vicie Mutters, PA-C 12:34 PM [facility]

## 2019-01-08 NOTE — Progress Notes (Addendum)
Chief Complaint:     Nausea, vomiting, diarrhea  IMPRESSION and PLAN:    73 year old female with recurrent nausea, vomiting, diarrhea.  She has had similar symptoms on several occasions over the last few years.  Vomiting has resolved and over the last few days stools beginning to solidify.  Still having occasional abdominal cramping. Symptoms may have been secondary to viral gastroenteritis, possibly just flare of IBS.  -She is requesting something for abdominal cramps.  Recalls something prescribed in 2018 which helped.  I have looked back and that appears to be dicyclomine.  Will refill dicyclomine for her.  PCP just prescribed hyoscyamine which made her too sleepy -Patient will call if diarrhea does not resolve  Addendum: Reviewed and agree with assessment and management plan. Pyrtle, Lajuan Lines, MD    HPI:     Patient is a 73 year old female with a history of CLL, obesity, OSA, hypertension, adenomatous colon polyps and GERD.  We have seen her several times since 2016 for evaluation of nausea, vomiting, diarrhea and abdominal pain .  In December 2017 she had an unrevealing EGD / colonoscopy for evaluation of symptoms. She apparently did relatively well for about a year  Late January 2018 -returned to clinic.  Had been out of work several weeks on short-term disability because of urgent , crampy diarrhea and intermittent incontinence.  Patient was managed as IBS- D,  started on dicyclomine 3 times daily, given a course of Xifaxan, She completed the course of antibiotics and then became constipated so started on Miralax.  Since that time she has done relatively well except for occasional constipation  Patient is back in with nausea, vomiting, and diarrhea.  She describes having the stomach flu after being around sick coworkers as well as her husband who had similar symptoms.  Symptoms started on 12 January.  Nausea/vomiting finally subsided a week ago.  She is still having crampy  diarrhea almost daily.  Sometimes has up to 4 episodes a day.  Stools did not contain any blood, she has had no fever.  Her appetite is okay.  No recent medication changes.  Patient saw PCP, stool studies ordered but patient could not collect the sample without contaminating it with urine.  PCP gave her hyoscyamine, this makes her too sleepy. She has been off work several days,  scared to return to work with the unpredictable urgent diarrhea. Since making this appointment she has actually had improvement in the diarrhea. Hasn't taken any imodium since first week of onset of diarrhea.  On Saturday she had a soft stool, Sunday and today no bowel movements.  She still having some occasional abdominal cramping, requesting something for this.  She does recall getting relief from think we prescribed back in 2018.   11/21/16 procedures for abdominal pain / diarrhea.   EGD- unremarkable, small bowel bx negative.  Colonoscopy with random biopsies unremarkable except for removal of 3 adenomatous polyps  Stool studies, fecal elastase and 24-hour urine for 5 HIAA were all negative   Review of systems:     No chest pain, no SOB, no fevers, no urinary sx   Past Medical History:  Diagnosis Date  . Anemia   . Anxiety   . Arthritis   . CLL (chronic lymphocytic leukemia) ([place]) dx'd ~ 2015  . Depression   . GERD (gastroesophageal reflux disease)   . Headache    "sinus headaches"  . History of kidney stones   . Hypertension   .  OSA (obstructive sleep apnea)   . Pneumonia   . Prediabetes   . Recurrent sinus infections 08/02/2012    Patient's surgical history, family medical history, social history, medications and allergies were all reviewed in Epic   Serum creatinine: 0.86 mg/dL 12/24/18 1634 Estimated creatinine clearance: 61.8 mL/min  Current Outpatient Medications  Medication Sig Dispense Refill  . ALPRAZolam (XANAX) 1 MG tablet TAKE 1/2 TO 1 TABLET BY MOUTH THREE TIMES A DAY AS NEEDED 90 tablet 0   . Ascorbic Acid (VITAMIN C) 1000 MG tablet Take 1,500 mg by mouth daily.     . bisoprolol-hydrochlorothiazide (ZIAC) 10-6.25 MG tablet TAKE ONE TABLET BY MOUTH DAILY 90 tablet 1  . Cholecalciferol (D 2000) 2000 units TABS Take 10,000 Units by mouth daily.     . DiphenhydrAMINE HCl (BENADRYL ALLERGY PO) Take by mouth. Take 2 tablets daily    . escitalopram (LEXAPRO) 10 MG tablet TAKE ONE TABLET BY MOUTH DAILY FOR MOOD 90 tablet 1  . ezetimibe (ZETIA) 10 MG tablet TAKE ONE TABLET BY MOUTH DAILY 90 tablet 1  . famotidine (PEPCID) 40 MG tablet Take 1 tablet daily for Heartburn & Reflux 90 tablet 3  . fexofenadine (ALLEGRA) 180 MG tablet Take 1 tablet (180 mg total) by mouth daily. Take PRN for allergies. 30 tablet 6  . Hyoscyamine Sulfate SL (LEVSIN/SL) 0.125 MG SUBL Place 0.125 mg under the tongue every 4 (four) hours as needed (diarrhea, nausea, stomach cramps). 60 each 3  . ondansetron (ZOFRAN) 4 MG tablet Take 1 tablet (4 mg total) by mouth every 8 (eight) hours as needed for nausea or vomiting. 30 tablet 0  . Polyethyl Glycol-Propyl Glycol (SYSTANE OP) Apply 2 drops daily to eye.    . Probiotic Product (PROBIOTIC PO) Take 1 tablet daily by mouth.     No current facility-administered medications for this visit.     Physical Exam:     BP 132/80   Pulse 74   Ht 5\' 2"  (1.575 m)   Wt 199 lb (90.3 kg)   SpO2 94%   BMI 36.40 kg/m   GENERAL:  Pleasant female in NAD PSYCH: : Cooperative, normal affect EENT:  conjunctiva pink, mucous membranes moist, neck supple without masses CARDIAC:  RRR,  no peripheral edema PULM: Normal respiratory effort, lungs CTA bilaterally, no wheezing ABDOMEN:  Nondistended, soft, nontender. No obvious masses, no hepatomegaly,  normal bowel sounds SKIN:  turgor, no lesions seen Musculoskeletal:  Normal muscle tone, normal strength NEURO: Alert and oriented x 3, no focal neurologic deficits   Tye Savoy , NP 01/08/2019, 11:31 AM

## 2019-01-08 NOTE — Patient Instructions (Addendum)
If you are age 73 or older, your body mass index should be between 23-30. Your Body mass index is 36.4 kg/m. If this is out of the aforementioned range listed, please consider follow up with your Primary Care Provider.  If you are age 85 or younger, your body mass index should be between 19-25. Your Body mass index is 36.4 kg/m. If this is out of the aformentioned range listed, please consider follow up with your Primary Care Provider.   We have sent the following medications to your pharmacy for you to pick up at your convenience: Bentyl 10 mg  STOP Levsin.  Call me if diarrhea doesn't continue to improve.  Thank you for choosing me and [facility].   Tye Savoy, NP

## 2019-01-09 ENCOUNTER — Telehealth: Payer: Self-pay | Admitting: Nurse Practitioner

## 2019-01-09 ENCOUNTER — Encounter: Payer: Self-pay | Admitting: Nurse Practitioner

## 2019-01-09 ENCOUNTER — Ambulatory Visit: Payer: 59 | Admitting: Physician Assistant

## 2019-01-09 ENCOUNTER — Encounter: Payer: Self-pay | Admitting: Physician Assistant

## 2019-01-09 VITALS — BP 150/80 | HR 83 | Temp 97.5°F | Ht 62.0 in | Wt 198.7 lb

## 2019-01-09 DIAGNOSIS — C9110 Chronic lymphocytic leukemia of B-cell type not having achieved remission: Secondary | ICD-10-CM

## 2019-01-09 DIAGNOSIS — R197 Diarrhea, unspecified: Secondary | ICD-10-CM

## 2019-01-09 DIAGNOSIS — D849 Immunodeficiency, unspecified: Secondary | ICD-10-CM

## 2019-01-09 DIAGNOSIS — D801 Nonfamilial hypogammaglobulinemia: Secondary | ICD-10-CM

## 2019-01-09 DIAGNOSIS — D899 Disorder involving the immune mechanism, unspecified: Secondary | ICD-10-CM | POA: Diagnosis not present

## 2019-01-09 MED ORDER — CHOLESTYRAMINE LIGHT 4 G PO PACK: 4.0000 g | PACK | Freq: Two times a day (BID) | ORAL | 0 refills | Status: DC

## 2019-01-09 NOTE — Telephone Encounter (Signed)
Pt informed that "the blue capsule that Amy Esterwood had prescribed"  discussed during OV 2.4.20 was doxycycline hyclate 100 mg not dicyclomine as prescribed.  Please see note from 01/19/2017 regarding Xifaxan as well.   Pt reports that she still has diarrhea and that the dicyclomine is not helping.  Please CB to advise.

## 2019-01-09 NOTE — Patient Instructions (Addendum)
Start the [place] Try 1/2 a packet or 1-2 tablespoons mixed in water or a small amount of gatorade See if this helps with the diarrhea Can take before meals if needed or just once a day  Please have your husband do the stool samples since you are unable to or if you feel that you can, do them yourself  If not doing better next week than call GI for the antibiotic The antibiotic you were on xifaxan   Can do a steroid nasal spary 1-2 sparys at night each nostril.  Remember to spray each nostril twice towards the outer part of your eye.   Do not sniff but instead pinch your nose and tilt your head back to help the medicine get into your sinuses.   The best time to do this is at bedtime.  Stop if you get blurred vision or nose bleeds.   THIS WILL TAKE 7 DAYS TO WORK AND IS BETTER IF YOU START BEFORE SYMPTOMS SO IF YOU HAVE A SEASON OR TIME OF THE YEAR YOU ALWAYS GET A COLD, START BEFORE THAT!     Stool Culture Why am I having this test? A stool culture test is used to check for infections of the intestines that are caused by bacteria, viruses, or parasites. Your health care provider may order this test if you have symptoms such as a fever, abdominal pain, and diarrhea that last for more than a few days or weeks. What is being tested? This test checks your stool (feces) for bacteria, viruses, and parasites that may cause illness. What kind of sample is taken?  A stool sample is required for this test. You will collect the sample when you have a bowel movement. How do I collect samples at home? You may be asked to collect a stool sample at home. Follow instructions from a health care provider about how to collect the sample. When collecting a stool sample at home, make sure you:  Use supplies and instructions that you received from the lab.  Have a bowel movement directly into a clean, dry container. Do not collect stool from the water in the toilet.  Transfer the sample into the  germ-free (sterile) cup that you received from the lab.  Do not let any toilet paper or urine get into the cup.  Wash your hands with soap and water after collecting the sample.  Return the sample to the lab as instructed. How do I prepare for this test?  Tell your health care provider about all medicines you are taking, including vitamins, herbs, eye drops, creams, and over-the-counter medicines. How are the results reported? Your test results will be reported as normal or will identify the abnormal bacteria, virus, or parasite that was found. What do the results mean? Normal results include:  Normal intestinal flora. This means that the bacteria and fungi that are present are typically found in your intestines and are present in normal amounts.  No ova or parasite infestation. This means that there is no evidence of parasites in your intestines. Abnormal results will specify the bacteria, virus, or parasite that is responsible for the infection. Talk with your health care provider about what your results mean. Questions to ask your health care provider Ask your health care provider, or the department that is doing the test:  When will my results be ready?  How will I get my results?  What are my treatment options?  What other tests do I need?  What are  my next steps? Summary  A stool culture test is used to check for infections of the intestines that are caused by bacteria, viruses, or parasites.  Your health care provider may order this test if you have symptoms such as a fever, abdominal pain, and diarrhea that last for more than a few days or weeks.  A stool sample is required for this test. You will collect the sample when you have a bowel movement. This information is not intended to replace advice given to you by your health care provider. Make sure you discuss any questions you have with your health care provider. Document Released: 12/24/2010 Document Revised:  07/18/2017 Document Reviewed: 07/18/2017 Elsevier Interactive Patient Education  2019 [place] and Parasite Stool Test Why am I having this test? The ova and parasite stool test is an exam of your stool (feces) to check for signs of a parasite infection. This test may also be called a stool for ova and parasites test, or stool for O&P test. You may have this test if:  You have symptoms that may be caused by a parasite infection, such as: ? Diarrhea, especially if there is blood or mucus in your stool. ? Abdominal cramping or pain. ? Excessive gas (flatus). ? Nausea.  You have recently done something that raises the risk of parasitic infection, such as: ? Traveling internationally. ? Taking antibiotics for a long time. ? Drinking water from a well, creek, or river. The most common parasites that may cause an infection include:  Giardia intestinalis.  Hookworm (Ascaris species).  Tapeworm (Strongyloides species).  Cryptosporidium. What is being tested? This test checks your stool for the presence of a parasite or eggs (ova) from a parasite. What kind of sample is taken?  A stool sample is required for this test. How do I collect samples at home? When collecting a stool sample at home, make sure you:  Use supplies and instructions that you received from the lab.  Have a bowel movement directly into a clean, dry container. Do not collect stool from the water in the toilet.  Transfer the sample into the germ-free (sterile) cup that you received from the lab.  Do not let any toilet paper or urine get into the cup.  Wash your hands with soap and water after collecting the sample.  Return the sample to the lab as instructed. Tell a health care provider about:  Any allergies you have.  All medicines you are taking, including vitamins, herbs, eye drops, creams, and over-the-counter medicines.  Any surgeries you have had.  Any medical conditions you  have.  Whether you are pregnant or may be pregnant. How are the results reported? Your test results will be reported as either positive or negative for ova or parasites in your stool. What do the results mean? A negative result means that you have no ova or parasites in your stool, which is normal. A positive result means that you have ova, parasites, or both in your stool. This means that you have a parasite infection in your stomach or intestines (gastrointestinal tract, GI tract). The test results will specify which type of parasite is present. Talk with your health care provider about what your results mean. Questions to ask your health care provider Ask your health care provider, or the department that is doing the test:  When will my results be ready?  How will I get my results?  What are my treatment options?  What other tests  do I need?  What are my next steps? Summary  The ova and parasite stool test is an exam of your stool (feces) to check for signs of a parasite infection.  A negative result means that you have no ova or parasites in your stool, which is normal.  A positive result means that you have ova, parasites, or both in your stool. This information is not intended to replace advice given to you by your health care provider. Make sure you discuss any questions you have with your health care provider. Document Released: 12/24/2004 Document Revised: 08/01/2017 Document Reviewed: 08/01/2017 Elsevier Interactive Patient Education  2019 [employer].

## 2019-01-09 NOTE — Telephone Encounter (Signed)
Called patient. Patient states she was confused with medications.  Called the wrong office regarding wrong medication.  That everything is straight. She apologized for the inconvenience.

## 2019-01-11 ENCOUNTER — Other Ambulatory Visit: Payer: 59

## 2019-01-11 ENCOUNTER — Ambulatory Visit: Payer: 59

## 2019-01-16 ENCOUNTER — Encounter: Payer: Self-pay | Admitting: Physician Assistant

## 2019-01-16 ENCOUNTER — Ambulatory Visit: Payer: 59 | Admitting: Physician Assistant

## 2019-01-22 NOTE — Progress Notes (Signed)
FMLA successfully faxed to [employer] at [phone]. Mailed copy to patient address on file.

## 2019-01-23 ENCOUNTER — Ambulatory Visit: Payer: Self-pay | Admitting: Physician Assistant

## 2019-01-24 ENCOUNTER — Telehealth: Payer: Self-pay | Admitting: Physician Assistant

## 2019-01-24 MED ORDER — PROMETHAZINE-CODEINE 6.25-10 MG/5ML PO SYRP: 5.0000 mL | ORAL_SOLUTION | Freq: Four times a day (QID) | ORAL | 0 refills | Status: DC | PRN

## 2019-01-24 NOTE — Telephone Encounter (Signed)
The patient has been notified of this information and all questions answered.

## 2019-01-24 NOTE — Telephone Encounter (Signed)
-----   Message from Elenor Quinones, [place] sent at 01/24/2019 11:38 AM EST ----- Regarding: med request Woke up this morning with a raw sore throat w/ a bad cough  Will you call her in something to her pharmacy  [facility] [employer]

## 2019-01-24 NOTE — Telephone Encounter (Signed)
She does not need an antibiotic, make sure she is on a stomach medication like omeprazole 20mg  twice a day from over the counter Treat with salt water gargles and I will send in a cough syrup. Make sure she is on a flonase nasal spray nightly

## 2019-01-25 ENCOUNTER — Ambulatory Visit: Payer: 59 | Admitting: Adult Health Nurse Practitioner

## 2019-01-25 ENCOUNTER — Encounter: Payer: Self-pay | Admitting: Adult Health Nurse Practitioner

## 2019-01-25 VITALS — BP 158/82 | HR 81 | Temp 97.5°F | Ht 62.0 in | Wt 199.6 lb

## 2019-01-25 DIAGNOSIS — C9110 Chronic lymphocytic leukemia of B-cell type not having achieved remission: Secondary | ICD-10-CM | POA: Diagnosis not present

## 2019-01-25 DIAGNOSIS — J029 Acute pharyngitis, unspecified: Secondary | ICD-10-CM

## 2019-01-25 DIAGNOSIS — R110 Nausea: Secondary | ICD-10-CM | POA: Diagnosis not present

## 2019-01-25 DIAGNOSIS — R0982 Postnasal drip: Secondary | ICD-10-CM | POA: Diagnosis not present

## 2019-01-25 DIAGNOSIS — R05 Cough: Secondary | ICD-10-CM

## 2019-01-25 DIAGNOSIS — J0140 Acute pansinusitis, unspecified: Secondary | ICD-10-CM

## 2019-01-25 DIAGNOSIS — I10 Essential (primary) hypertension: Secondary | ICD-10-CM

## 2019-01-25 DIAGNOSIS — R059 Cough, unspecified: Secondary | ICD-10-CM

## 2019-01-25 DIAGNOSIS — K219 Gastro-esophageal reflux disease without esophagitis: Secondary | ICD-10-CM

## 2019-01-25 MED ORDER — AZITHROMYCIN 250 MG PO TABS: ORAL_TABLET | ORAL | 0 refills | Status: DC

## 2019-01-25 MED ORDER — PROMETHAZINE-CODEINE 6.25-10 MG/5ML PO SYRP: 5.0000 mL | ORAL_SOLUTION | Freq: Four times a day (QID) | ORAL | 0 refills | Status: DC | PRN

## 2019-01-25 NOTE — Patient Instructions (Addendum)
Today you are being treated for:   Sinusitis     Treat the symptoms!  Please take these medications:     Allergy Symptoms / Runny Nose:  Zyrtec / Cetirizine Take 10mg  by mouth May cause drowsiness, take nightly Be sure to drink plenty of water If this is not effective, try Xyzal or Allegra  OR   Xyzal / Levocetirazine  Take 5mg  by mouth May cause drowsiness, take nightly Be sure to drink plenty of water If this is not effective try Allegra or Zyrtec  OR  Allegra / fexofenadine Take 180mg  by mouth daily If this is not effective try Zyrtec or Xyzal      Cough:   Mucinex DM - If you do not tolerate this take the Blue Mucinex every 12 hours. Take one tablet by mouth every 12 hours with plenty of water while you have symptoms. This is an expectorant and will help to clear the congestion in your lungs.  Promethazine Cough Syrup: Use as directed in prescription Do not drive or operate machinery while taking this medication It can cause sleepiness / drowsiness This will also help with nausea  Chest Congestion:  Cool-mist humidifier / vaporizer Or warm steamy shower  Menthol Chest rubs You can get at any pharmacy Use as directed on packaging    Sinus Congestion:   Flonase or Nasocort One spray in each nostril daily  This will help to open your nasal passages   [facility] Sinus Rinse / Neti Pot Use warm bottled or distilled water DO NOT use tap water! Use twice a day as needed This will help to sooth irritated sinuses and clear nasal congestion If using nasal sprays, do so after completing this.  Fevers:  Tylenol Extra Strength 500mg  Take 1-2 tablets every 8 hours as needed for fever or pain Do not take more than 4,000mg  to tylenol in 24 hours  Antibiotic: Azithromyacin , you have teaken this in the past and done well. Take as directed on your prescription   Sore Throat:  Cepacol lozenges as needed You can get these at any  pharmacy  Throat Spray: Use as directed on package as needed You may get this at any pharmacy  Warm / Cold drinks: This will help to sooth your throat  Gargle warm salt water: Gargle then spit water out, do not swallow  Rest your voice: Avoid whispering as well when possible   General Care:  Drink plenty of clear fluids  Get plenty of rest  Wash hands frequently and sanitize shared surfaces, kitchens, bathrooms.  Call or return with new or worsening symptoms

## 2019-01-25 NOTE — Progress Notes (Signed)
Assessment and Plan:  Rebecca Bond was seen today for acute visit.  Diagnoses and all orders for this visit:  Acute non-recurrent pansinusitis -     azithromycin (ZITHROMAX) 250 MG tablet; Take 2 tablets PO on day 1, then 1 tablet PO Q24H x 4 days Choose an antihistamine to help dry up nasal drainage.   -[facility] Sinus Rinse / Neti Pot - Sample provided Use warm bottled or distilled water DO NOT use tap water! Use twice a day as needed This will help to sooth irritated sinuses and clear nasal congestion If using nasal sprays, do so after completing this.  Flonase or Nasocort One spray in each nostril daily  This will help to open your nasal passages.   CLL (chronic lymphocytic leukemia) ([place])  Following with oncology  Nausea -     promethazine-codeine (PHENERGAN WITH CODEINE) 6.25-10 MG/5ML syrup; Take 5 mLs by mouth every 6 (six) hours as needed for cough. Max: 52mL per day Discussed likely from drainage  Post Nasal Drainage Consider switching Allegra, pick one from below.  Zyrtec / Cetirizine Take 10mg  by mouth May cause drowsiness, take nightly Be sure to drink plenty of water If this is not effective, try Xyzal OR Allegra  OR  Xyzal / Levocetirazine  Take 5mg  by mouth May cause drowsiness, take nightly Be sure to drink plenty of water If this is not effective try Allegra OR Zyrtec  OR  Allegra / fexofenadine Take 180mg  by mouth If this is not effective try Zyrtec OR Xyzal   *If you battle with chronic allergies you may need to change the antihistamine you currently use to find most effective.  Sore Throat OTC lozenges or throat spray Gargle walt salt water, spit out Warm / cold liquids  You may take: buprofen / Advil / Motrin - for pain swelling of throat  600mg  every six hours OR 800mg  every 8 hours If you have this at home each tablet is 200mg .   AND / OR  Tylenol Extra Strength 500mg  - for pain Take 1-2 tablets every 8 hours as needed for  fever or pain Do not take more than 4,000mg  to tylenol in 24 hours  General Care: Drink plenty of clear fluids Get plenty of rest Wash hands frequently and sanitize shared surfaces, kitchens, bathrooms.  Cough Discussed OTC Mucinex DM Q12 while symptomatic.  If you do not tolerate the DM switch to Mucinex [facility] regular Q12. Increase water intake, at least 80oz or more daily OTC menthol chest rubs Cool mist humidifier or warm shower You may continue to use the promethazine cough syrup at night, may cause drowsiness.  Do not drive on this medication.  Essential hypertension Elevated today Continue to monitor  Gastroesophageal reflux disease, esophagitis presence not specified Take pepsid daily Continue to monitor symptoms Monitor foods for triggers     Further disposition pending results of labs. Discussed med's effects and SE's.   Over 30 minutes of exam, counseling, chart review, and critical decision making was performed.   Future Appointments  Date Time Provider [place]  02/06/2019  4:00 PM Vicie Mutters, PA-C [facility] None  03/13/2019  2:00 PM [facility] LAB 2 [facility] None  03/13/2019  2:40 PM Brunetta Genera, MD [facility] None  04/10/2019  4:00 PM Vicie Mutters, PA-C [facility] None  09/30/2019  3:00 PM [name], MD [facility] None    ------------------------------------------------------------------------------------------------------------------   HPI 73 y.o.female presents for cough that has been waking her up at night.  It is  productive and reports yellow phlem.  She reports her throat is raw and she has a headache along with sinus pressure.  She is achy all over and has felt nauseated.  She is hypersensitive and reports that her clothes hurt her body.  She reports she has not been eating well.  She has omeprazole but has not been taking this daily.  She started taking this and reports it has not made a difference with her cough.   Reports she was difficult for her to talk on the phone at work.  Reports she does not bend over as it puts extra pressure and causes pain in her face.    She has been using Nasocort daily.  She is taking Allegra daily and benedryl sometimes when her nose is running.  She has been taking extra Vitamin C and Vitamin D as well.  She has also increased her water intake.  But feels like her symptoms are progressively getting worse.     Past Medical History:  Diagnosis Date  . Anemia   . Anxiety   . Arthritis   . CLL (chronic lymphocytic leukemia) ([place]) dx'd ~ 2015  . Depression   . GERD (gastroesophageal reflux disease)   . Headache    "sinus headaches"  . History of kidney stones   . Hypertension   . OSA (obstructive sleep apnea)   . Pneumonia   . Prediabetes   . Recurrent sinus infections 08/02/2012     Allergies  Allergen Reactions  . Hyzaar [Losartan Potassium-Hctz] Other (See Comments)    "messed up my sodium counts" swells up lips.  Rebecca Bond [Pseudoephedrine Hcl] Palpitations  . Levaquin [Levofloxacin In D5w] Diarrhea and Nausea Only  . Acyclovir And Related   . Biaxin [Clarithromycin]     GI Upset  . Celexa [Citalopram]   . Flexeril [Cyclobenzaprine]     "zombie-like" feeling  . Fosamax [Alendronate Sodium]     GI upset  . Iohexol Hives     Code: HIVES, Desc: pt states she broke out in hive 20 yrs ago from IV contrast.     . Losartan     Angioedema  . Meloxicam     GI upset  . Norvasc [Amlodipine] Swelling  . Pseudoephedrine     Palpitations  . Zoloft [Sertraline Hcl] Other (See Comments)    Has no emotions at all     Current Outpatient Medications on File Prior to Visit  Medication Sig  . ALPRAZolam (XANAX) 1 MG tablet TAKE 1/2 TO 1 TABLET BY MOUTH THREE TIMES A DAY AS NEEDED  . Ascorbic Acid (VITAMIN C) 1000 MG tablet Take 1,500 mg by mouth daily.   . bisoprolol-hydrochlorothiazide (ZIAC) 10-6.25 MG tablet TAKE ONE TABLET BY MOUTH DAILY  . Cholecalciferol  (D 2000) 2000 units TABS Take 10,000 Units by mouth daily.   . cholestyramine light (PREVALITE) 4 g packet Take 1 packet (4 g total) by mouth 2 (two) times daily.  . DiphenhydrAMINE HCl (BENADRYL ALLERGY PO) Take by mouth. Take 2 tablets daily  . escitalopram (LEXAPRO) 10 MG tablet TAKE ONE TABLET BY MOUTH DAILY FOR MOOD  . ezetimibe (ZETIA) 10 MG tablet TAKE ONE TABLET BY MOUTH DAILY  . famotidine (PEPCID) 40 MG tablet Take 1 tablet daily for Heartburn & Reflux  . fexofenadine (ALLEGRA) 180 MG tablet Take 1 tablet (180 mg total) by mouth daily. Take PRN for allergies.  . Hyoscyamine Sulfate (HYOSCYAMINE PO) Take by mouth.  Vladimir Faster Glycol-Propyl Glycol (SYSTANE OP)  Apply 2 drops daily to eye.  . Probiotic Product (PROBIOTIC PO) Take 1 tablet daily by mouth.   No current facility-administered medications on file prior to visit.     IHW:TUUEKC of Systems  Constitutional: Positive for chills. Negative for diaphoresis, fever, malaise/fatigue and weight loss.  HENT: Positive for congestion, sinus pain and sore throat. Negative for ear discharge, ear pain, hearing loss, nosebleeds and tinnitus.   Respiratory: Positive for cough, sputum production and wheezing. Negative for hemoptysis, shortness of breath and stridor.   Cardiovascular: Negative for chest pain, palpitations, orthopnea, claudication, leg swelling and PND.  Gastrointestinal: Positive for nausea. Negative for abdominal pain, blood in stool, constipation, diarrhea, heartburn and vomiting.  Genitourinary: Negative for dysuria, flank pain, frequency, hematuria and urgency.  Skin: Negative for itching and rash.  Neurological: Positive for headaches. Negative for dizziness, tingling, tremors, sensory change, speech change, focal weakness, seizures, loss of consciousness and weakness.  Endo/Heme/Allergies: Positive for environmental allergies. Negative for polydipsia. Does not bruise/bleed easily.    Physical Exam:  BP (!) 158/82    Pulse 81   Temp (!) 97.5 F (36.4 C)   Ht 5\' 2"  (1.575 m)   Wt 199 lb 9.6 oz (90.5 kg)   SpO2 93%   BMI 36.51 kg/m   General Appearance: Well nourished, in no apparent distress. Eyes: PERRLA, EOMs, conjunctiva no swelling or erythema Sinuses: Frontal/maxillary tenderness ENT/Mouth: Ext aud canals clear, TMs without erythema, bulging.Serous noted behind bilateral TM's. Mild erythema, no swelling, or exudate on post pharynx.  Tonsils not swollen or erythematous. Hearing normal.  Neck: Supple, thyroid normal.  Respiratory: Respiratory effort normal, BS equal bilaterally without rales, wheezing or stridor. Rhonchi noted bilateral upper lobes. Cardio: RRR with no MRGs. Brisk peripheral pulses without edema.  Abdomen: Soft, + BS.  Non tender, no guarding, rebound, hernias, masses. Lymphatics: Cervical lymphadenopathy.  Remaining lymph Non tender without lymphadenopathy.  Skin: Warm, dry without rashes, lesions, ecchymosis.  Psych: Awake and oriented X 3, normal affect, Insight and Judgment appropriate.     Garnet Sierras, NP 7:09 PM [facility]

## 2019-01-29 ENCOUNTER — Encounter: Payer: Self-pay | Admitting: Adult Health Nurse Practitioner

## 2019-02-04 NOTE — Progress Notes (Signed)
Assessment and Plan: :  Diarrhea of presumed infectious origin versus IBD Continue questran as needed Get Cdiff if worsening diarrhea Suggested to follow up with GI if not better  Sinus infection With mastoid tenderness and teeth pain, will give augment Discussed cdiff risk and if not better will refer to ENT Nasal saline spray for congestion. Nasal steroids, allergy pill,   Further disposition pending results of labs. Discussed med's effects and SE's.   Over 15 minutes of exam, counseling, chart review, and critical decision making was performed.   Future Appointments  Date Time Provider [place]  02/06/2019  4:00 PM Vicie Mutters, PA-C [facility] None  03/13/2019  2:00 PM [facility] LAB 2 [facility] None  03/13/2019  2:40 PM Brunetta Genera, MD [facility] None  04/10/2019  4:00 PM Vicie Mutters, PA-C [facility] None  09/30/2019  3:00 PM [name], MD [facility] None    ------------------------------------------------------------------------------------------------------------------   HPI 73 y.o.female with GERD, presently untreated CLL presents for evaluation of persistent GI symptoms and sinus infections.   She has been out of work since  Jan 13th and currently out of work until 01/14/2019 due to diarrhea and fatigue.Saw GI, had normal EGD/colonoscopy Dec 2017. Has been seen for IBS-D in the past. She continue to have diarrhea but states it has improved from before. She continues to have urgency, some cramping, some urgency with fecal incontinence, last episode was Monday. She does not have a gallbladder. She was tried on [place], will take more as needed. Will happen 1-2 x a week. Encouraged her to keep food diary.   She continues to have sinus issues, she is feeling better but still has left sinus pressure, cough occ. She has left ear pain and feeling full. She was given zpak.    CBC Latest Ref Rng & Units 12/24/2018 12/13/2018 11/14/2018  WBC 3.8 - 10.8  Thousand/uL 59.4(H) 66.5(HH) 126.2(H)  Hemoglobin 11.7 - 15.5 g/dL 11.5(L) 11.4(L) 9.5(L)  Hematocrit 35.0 - 45.0 % 34.2(L) 34.8(L) 31.0(L)  Platelets 140 - 400 Thousand/uL 77(L) 78(L) 190    CT abd/pelvis 11/14/2016:  1. No acute abnormalities within the abdomen or pelvis. 2. Mild splenomegaly and several prominent to mildly enlarged pelvic and inguinal lymph nodes. These findings are similar to the prior CT. 3. Small nonobstructing stone in the lower pole the right kidney. No ureteral stones or obstructive uropathy.  Colonoscopy 2017:  - The examined portion of the ileum was normal. - Two 3 to 7 mm polyps in the cecum, removed with a cold snare. Resected and retrieved. - One 2 mm polyp in the cecum, removed with a cold biopsy forceps. Resected and retrieved. - Two 5 to 6 mm polyps in the sigmoid colon, removed with a cold snare. Resected and retrieved. - The examination was otherwise normal on direct and retroflexion views. - Biopsies were taken with a cold forceps from the right colon and left colon for evaluation of microscopic colitis.    Past Medical History:  Diagnosis Date  . Anemia   . Anxiety   . Arthritis   . CLL (chronic lymphocytic leukemia) ([place]) dx'd ~ 2015  . Depression   . GERD (gastroesophageal reflux disease)   . Headache    "sinus headaches"  . History of kidney stones   . Hypertension   . OSA (obstructive sleep apnea)   . Pneumonia   . Prediabetes   . Recurrent sinus infections 08/02/2012     Allergies  Allergen Reactions  . Hyzaar [Losartan Potassium-Hctz] Other (  See Comments)    "messed up my sodium counts" swells up lips.  [name] [Pseudoephedrine Hcl] Palpitations  . Levaquin [Levofloxacin In D5w] Diarrhea and Nausea Only  . Acyclovir And Related   . Biaxin [Clarithromycin]     GI Upset  . Celexa [Citalopram]   . Flexeril [Cyclobenzaprine]     "zombie-like" feeling  . Fosamax [Alendronate Sodium]     GI upset  . Iohexol Hives      Code: HIVES, Desc: pt states she broke out in hive 20 yrs ago from IV contrast.     . Losartan     Angioedema  . Meloxicam     GI upset  . Norvasc [Amlodipine] Swelling  . Pseudoephedrine     Palpitations  . Zoloft [Sertraline Hcl] Other (See Comments)    Has no emotions at all     Current Outpatient Medications on File Prior to Visit  Medication Sig  . ALPRAZolam (XANAX) 1 MG tablet TAKE 1/2 TO 1 TABLET BY MOUTH THREE TIMES A DAY AS NEEDED  . Ascorbic Acid (VITAMIN C) 1000 MG tablet Take 1,500 mg by mouth daily.   [name] Kitchen azithromycin (ZITHROMAX) 250 MG tablet Take 2 tablets PO on day 1, then 1 tablet PO Q24H x 4 days  . bisoprolol-hydrochlorothiazide (ZIAC) 10-6.25 MG tablet TAKE ONE TABLET BY MOUTH DAILY  . Cholecalciferol (D 2000) 2000 units TABS Take 10,000 Units by mouth daily.   . cholestyramine light (PREVALITE) 4 g packet Take 1 packet (4 g total) by mouth 2 (two) times daily.  . DiphenhydrAMINE HCl (BENADRYL ALLERGY PO) Take by mouth. Take 2 tablets daily  . escitalopram (LEXAPRO) 10 MG tablet TAKE ONE TABLET BY MOUTH DAILY FOR MOOD  . ezetimibe (ZETIA) 10 MG tablet TAKE ONE TABLET BY MOUTH DAILY  . famotidine (PEPCID) 40 MG tablet Take 1 tablet daily for Heartburn & Reflux  . fexofenadine (ALLEGRA) 180 MG tablet Take 1 tablet (180 mg total) by mouth daily. Take PRN for allergies.  . Hyoscyamine Sulfate (HYOSCYAMINE PO) Take by mouth.  Vladimir Faster Glycol-Propyl Glycol (SYSTANE OP) Apply 2 drops daily to eye.  . Probiotic Product (PROBIOTIC PO) Take 1 tablet daily by mouth.  . promethazine-codeine (PHENERGAN WITH CODEINE) 6.25-10 MG/5ML syrup Take 5 mLs by mouth every 6 (six) hours as needed for cough. Max: 76mL per day   No current facility-administered medications on file prior to visit.     ROS: all negative except above.   Physical Exam:  There were no vitals taken for this visit.  General Appearance: Well nourished, in no apparent distress. Eyes: PERRLA, EOMs,  conjunctiva no swelling or erythema Sinuses: No Frontal tenderness, + maxillary tenderness on the left ENT/Mouth:No erythema, swelling, or exudate on post pharynx.  Tonsils not swollen or erythematous. Hearing normal. + left mastoid tenderness  Neck: Supple, thyroid normal.  Respiratory: Respiratory effort normal, BS equal bilaterally without rales, rhonchi, wheezing or stridor.  Cardio: RRR with no MRGs. Brisk peripheral pulses without edema.  Abdomen: Firm, non-distended, + BS x 4.  Non tender, no guarding, rebound, hernias, distinct palpable masses.  Lymphatics: Non tender without lymphadenopathy through neck Musculoskeletal: Symmetrical strength, normal gait.  Skin: Warm, dry without rashes, lesions, ecchymosis.  Neuro:  Normal muscle tone, no cerebellar symptoms.  Psych: Awake and oriented X 3, normal affect, Insight and Judgment appropriate.     Vicie Mutters, PA-C 1:07 PM [facility]

## 2019-02-06 ENCOUNTER — Ambulatory Visit (INDEPENDENT_AMBULATORY_CARE_PROVIDER_SITE_OTHER): Payer: 59 | Admitting: Physician Assistant

## 2019-02-06 ENCOUNTER — Encounter: Payer: Self-pay | Admitting: Physician Assistant

## 2019-02-06 VITALS — BP 140/83 | HR 68 | Temp 97.3°F | Ht 62.0 in | Wt 199.8 lb

## 2019-02-06 DIAGNOSIS — C9110 Chronic lymphocytic leukemia of B-cell type not having achieved remission: Secondary | ICD-10-CM | POA: Diagnosis not present

## 2019-02-06 DIAGNOSIS — J0140 Acute pansinusitis, unspecified: Secondary | ICD-10-CM | POA: Diagnosis not present

## 2019-02-06 DIAGNOSIS — K580 Irritable bowel syndrome with diarrhea: Secondary | ICD-10-CM

## 2019-02-06 MED ORDER — AMOXICILLIN-POT CLAVULANATE 875-125 MG PO TABS: 1.0000 | ORAL_TABLET | Freq: Two times a day (BID) | ORAL | 0 refills | Status: AC

## 2019-02-06 NOTE — Patient Instructions (Addendum)
With your left mastoid left sinus tenderness will send in augmentin Continue the nasonex, if not better call  If any worsening diarrhea with funny smell need to check for Cdiff with recent antibiotics   If diarrhea is severe - 4+ watery diarrhea episodes, take imodium to reduce fluid loss, and start on electrolyte supplemented fluids.   Please go to the ER if you have any severe AB pain, unable to hold down food/water, blood in stool or vomit, chest pain, shortness of breath, or any worsening symptoms.   Consider keeping a food diary- common causes of diarrhea are dairy, certain carbs...  FODMAP stands for fermentable oligo-, di-, mono-saccharides and polyols (1). These are the scientific terms used to classify groups of carbs that are notorious for triggering digestive symptoms like bloating, gas and stomach pain.   FODMAPs are found in a wide range of foods in varying amounts. Some foods contain just one type, while others contain several.  The main dietary sources of the four groups of FODMAPs include:  Oligosaccharides: Wheat, rye, legumes and various fruits and vegetables, such as garlic and onions.  Disaccharides: Milk, yogurt and soft cheese. Lactose is the main carb.  Monosaccharides: Various fruit including figs and mangoes, and sweeteners such as honey and agave nectar. Fructose is the main carb.  Polyols: Certain fruits and vegetables including blackberries and lychee, as well as some low-calorie sweeteners like those in sugar-free gum.   Keep a food diary. This will help you identify foods that cause symptoms. Write down: ? What you eat and when. ? What symptoms you have. ? When symptoms occur in relation to your meals.  Avoid foods that cause symptoms. Talk with your dietitian about other ways to get the same nutrients that are in these foods.  Eat your meals slowly, in a relaxed setting.  Aim to eat 5-6 small meals per day. Do not skip meals.  Drink enough  fluids to keep your urine clear or pale yellow.  Ask your health care provider if you should take an over-the-counter probiotic during flare-ups to help restore healthy gut bacteria.  If you have cramping or diarrhea, try making your meals low in fat and high in carbohydrates. Examples of carbohydrates are pasta, rice, whole grain breads and cereals, fruits, and vegetables.  If dairy products cause your symptoms to flare up, try eating less of them. You might be able to handle yogurt better than other dairy products because it contains bacteria that help with digestion.

## 2019-02-22 ENCOUNTER — Other Ambulatory Visit: Payer: Self-pay | Admitting: Internal Medicine

## 2019-02-25 ENCOUNTER — Other Ambulatory Visit: Payer: Self-pay | Admitting: Internal Medicine

## 2019-03-13 ENCOUNTER — Telehealth: Payer: Self-pay | Admitting: Hematology

## 2019-03-13 ENCOUNTER — Other Ambulatory Visit: Payer: Self-pay

## 2019-03-13 ENCOUNTER — Inpatient Hospital Stay: Payer: 59 | Attending: Hematology | Admitting: Hematology

## 2019-03-13 ENCOUNTER — Inpatient Hospital Stay: Payer: 59

## 2019-03-13 VITALS — BP 181/58 | HR 72 | Temp 97.9°F | Resp 18 | Ht 62.0 in | Wt 204.5 lb

## 2019-03-13 DIAGNOSIS — K589 Irritable bowel syndrome without diarrhea: Secondary | ICD-10-CM | POA: Insufficient documentation

## 2019-03-13 DIAGNOSIS — Z79899 Other long term (current) drug therapy: Secondary | ICD-10-CM

## 2019-03-13 DIAGNOSIS — D649 Anemia, unspecified: Secondary | ICD-10-CM | POA: Diagnosis not present

## 2019-03-13 DIAGNOSIS — Z87442 Personal history of urinary calculi: Secondary | ICD-10-CM | POA: Insufficient documentation

## 2019-03-13 DIAGNOSIS — G4730 Sleep apnea, unspecified: Secondary | ICD-10-CM | POA: Insufficient documentation

## 2019-03-13 DIAGNOSIS — C9110 Chronic lymphocytic leukemia of B-cell type not having achieved remission: Secondary | ICD-10-CM | POA: Diagnosis not present

## 2019-03-13 DIAGNOSIS — F329 Major depressive disorder, single episode, unspecified: Secondary | ICD-10-CM

## 2019-03-13 DIAGNOSIS — M129 Arthropathy, unspecified: Secondary | ICD-10-CM | POA: Diagnosis not present

## 2019-03-13 DIAGNOSIS — I10 Essential (primary) hypertension: Secondary | ICD-10-CM | POA: Diagnosis not present

## 2019-03-13 DIAGNOSIS — Z87891 Personal history of nicotine dependence: Secondary | ICD-10-CM

## 2019-03-13 DIAGNOSIS — K219 Gastro-esophageal reflux disease without esophagitis: Secondary | ICD-10-CM | POA: Diagnosis not present

## 2019-03-13 DIAGNOSIS — D696 Thrombocytopenia, unspecified: Secondary | ICD-10-CM

## 2019-03-13 LAB — CBC WITH DIFFERENTIAL/PLATELET
Abs Immature Granulocytes: 0.18 10*3/uL — ABNORMAL HIGH (ref 0.00–0.07)
Basophils Absolute: 0 10*3/uL (ref 0.0–0.1)
Basophils Relative: 0 %
Eosinophils Absolute: 0.3 10*3/uL (ref 0.0–0.5)
Eosinophils Relative: 0 %
HCT: 37 % (ref 36.0–46.0)
Hemoglobin: 11.9 g/dL — ABNORMAL LOW (ref 12.0–15.0)
Immature Granulocytes: 0 %
Lymphocytes Relative: 93 %
Lymphs Abs: 76.7 10*3/uL — ABNORMAL HIGH (ref 0.7–4.0)
MCH: 31.1 pg (ref 26.0–34.0)
MCHC: 32.2 g/dL (ref 30.0–36.0)
MCV: 96.6 fL (ref 80.0–100.0)
Monocytes Absolute: 1.8 10*3/uL — ABNORMAL HIGH (ref 0.1–1.0)
Monocytes Relative: 2 %
Neutro Abs: 3.9 10*3/uL (ref 1.7–7.7)
Neutrophils Relative %: 5 %
Platelets: 76 10*3/uL — ABNORMAL LOW (ref 150–400)
RBC: 3.83 MIL/uL — ABNORMAL LOW (ref 3.87–5.11)
RDW: 14 % (ref 11.5–15.5)
WBC: 82.8 10*3/uL (ref 4.0–10.5)
nRBC: 0 % (ref 0.0–0.2)

## 2019-03-13 LAB — COMPREHENSIVE METABOLIC PANEL
ALT: 19 U/L (ref 0–44)
AST: 28 U/L (ref 15–41)
Albumin: 4.2 g/dL (ref 3.5–5.0)
Alkaline Phosphatase: 64 U/L (ref 38–126)
Anion gap: 10 (ref 5–15)
BUN: 19 mg/dL (ref 8–23)
CO2: 23 mmol/L (ref 22–32)
Calcium: 9 mg/dL (ref 8.9–10.3)
Chloride: 105 mmol/L (ref 98–111)
Creatinine, Ser: 0.84 mg/dL (ref 0.44–1.00)
GFR calc Af Amer: >60 mL/min (ref >60)
GFR calc non Af Amer: >60 mL/min (ref >60)
Glucose, Bld: 107 mg/dL — ABNORMAL HIGH (ref 70–99)
Potassium: 3.7 mmol/L (ref 3.5–5.1)
Sodium: 138 mmol/L (ref 135–145)
Total Bilirubin: 0.6 mg/dL (ref 0.3–1.2)
Total Protein: 6.6 g/dL (ref 6.5–8.1)

## 2019-03-13 NOTE — Progress Notes (Signed)
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  INTERVAL HISTORY:  Rebecca Bond returns today for management and evaluation of [name]. The patient's last visit with [place] was on 12/13/18. The pt reports that she is doing well overall.   The pt reports that she had some concerns with her IBS in the interim, which has calmed down since she begun cholestyramine. She denies nausea, vomiting, or diarrhea presently.  The pt notes that she senses some slight changes in the size of her axillary lymph nodes, sensing these to be slightly larger. The pt denies any fevers, chills, night sweats, or unexpected weight loss. The pt denies any concerns for infections at this time. The pt denies chest pain, abdominal pains or noticing any new lumps or bumps. She denies any nose bleeds, gum bleeds, blood in the stools, or other concerns for bleeding.   Lab results today (03/13/19) of CBC w/diff and CMP is as follows: all values are WNL except for WBC at 82.8k, RBC at 3.83, HGB at 11.9, PLT at 76k, Lymphs at 76.7k, Monocytes abs at 1.8k, Abs immature granulocytes at 0.18k, Glucose at 107.  On review  of systems, pt reports stable energy levels, eating well, possibly enlarged axillary lymph nodes, and denies fevers, chills, inght sweats, unexpected weight loss, concern for infection, nausea, vomiting, diarrhea, nose bleeds, gum bleeds, blood in the stools, chest pain, abdominal pains, noticing any new lumps or bumps, and any other symptoms.   REVIEW OF SYSTEMS:    A 10+ POINT REVIEW OF SYSTEMS WAS OBTAINED including neurology, dermatology, psychiatry, cardiac, respiratory, lymph, extremities, GI, GU, Musculoskeletal, constitutional, breasts, reproductive, HEENT.  All pertinent positives are noted in the HPI.  All others are negative.   . Past Medical History:  Diagnosis Date  . Anemia   . Anxiety   . Arthritis   . CLL (chronic lymphocytic leukemia) ([place]) dx'd ~ 2015  . Depression   . GERD (gastroesophageal reflux disease)   . Headache    "sinus headaches"  . History of kidney stones   . Hypertension   . OSA (obstructive sleep apnea)   . Pneumonia   . Prediabetes   . Recurrent sinus infections 08/02/2012    . Past Surgical History:  Procedure Laterality Date  . ABDOMINAL HYSTERECTOMY  1980's   "endometrosis"  . APPENDECTOMY    . BREAST SURGERY    . CATARACT EXTRACTION, BILATERAL    . CHOLECYSTECTOMY  1985  . FUNCTIONAL ENDOSCOPIC SINUS SURGERY  1990's   "cause I kept having sinus infections"  . immunoglobulin treatment  2017  . LAPAROSCOPIC APPENDECTOMY N/A 06/04/2014   Procedure: APPENDECTOMY LAPAROSCOPIC;  Surgeon: Zenovia Jarred, MD;  Location: [facility];  Service: General;  Laterality: N/A;  . LUMBAR LAMINECTOMY/DECOMPRESSION MICRODISCECTOMY Left 11/01/2017   Procedure: Left Lumbar Four-Five Extraforaminal Microdiscectomy;  Surgeon: Eustace Moore, MD;  Location: [facility];  Service: Neurosurgery;  Laterality: Left;  . LYMPH NODE BIOPSY     "determined I had CLL"  . SHOULDER ARTHROSCOPY Right   . TEMPOROMANDIBULAR JOINT ARTHROPLASTY  1980's  . TONSILLECTOMY AND ADENOIDECTOMY   1950's    . Social History   Tobacco Use  . Smoking status: Former Smoker    Packs/day: 0.75    Years: 4.00    Pack years: 3.00    Types: Cigarettes    Last attempt to quit: 12/05/1968    Years since quitting: 50.3  . Smokeless tobacco: Never Used  Substance Use Topics  . Alcohol use: Yes    Alcohol/week: 7.0 standard drinks    Types: 7 Glasses of wine per week  . Drug use: No    ALLERGIES:  is allergic to hyzaar [losartan potassium-hctz]; sudafed [pseudoephedrine hcl]; levaquin [levofloxacin in d5w]; acyclovir and related; biaxin [clarithromycin]; celexa [citalopram]; flexeril [cyclobenzaprine]; fosamax [alendronate sodium]; iohexol; losartan; meloxicam; norvasc [amlodipine]; pseudoephedrine; and zoloft [sertraline hcl].  MEDICATIONS:  Current Outpatient Medications  Medication Sig Dispense Refill  . ALPRAZolam (XANAX) 1 MG tablet TAKE 1/2 TO 1 TABLET BY MOUTH THREE TIMES A DAY AS NEEDED 90 tablet 0  . Ascorbic Acid (VITAMIN C) 1000 MG tablet Take 1,500 mg by mouth daily.     . bisoprolol-hydrochlorothiazide (ZIAC) 10-6.25 MG tablet TAKE ONE TABLET BY MOUTH DAILY 90 tablet 0  . Cholecalciferol (D 2000) 2000 units TABS Take 10,000 Units by mouth daily.     . cholestyramine light (PREVALITE) 4 g packet Take 1 packet (4 g total) by mouth 2 (two) times daily. 60 packet 0  . escitalopram (LEXAPRO) 10 MG tablet TAKE ONE TABLET BY MOUTH DAILY FOR MOOD 90 tablet 1  . ezetimibe (ZETIA) 10 MG tablet TAKE ONE TABLET BY MOUTH DAILY 90 tablet 1  . famotidine (PEPCID) 40 MG tablet Take 1 tablet daily for Heartburn & Reflux 90 tablet 3  . fexofenadine (ALLEGRA) 180 MG tablet Take 1 tablet (180 mg total) by mouth daily. Take PRN for allergies. 30 tablet 6  . Hyoscyamine Sulfate (HYOSCYAMINE PO) Take by mouth.    Vladimir Faster Glycol-Propyl Glycol (SYSTANE OP) Apply 2 drops daily to eye.    . Probiotic Product (PROBIOTIC PO) Take 1 tablet daily by mouth.    . promethazine-codeine (PHENERGAN WITH  CODEINE) 6.25-10 MG/5ML syrup Take 5 mLs by mouth every 6 (six) hours as needed for cough. Max: 60mL per day (Patient not taking: Reported on 02/06/2019) 240 mL 0   No current facility-administered medications for this visit.     PHYSICAL EXAMINATION: ECOG PERFORMANCE STATUS: 1 - Symptomatic but completely ambulatory  Vitals:   03/13/19 1418  BP: (!) 181/58  Pulse: 72  Resp: 18  Temp: 97.9 F (36.6 C)  SpO2: 96%    Filed Weights   03/13/19 1418  Weight: 204 lb 8 oz (92.8 kg)   .Body mass index is 37.4 kg/m.  GENERAL:alert, in no acute distress and comfortable SKIN: no acute rashes, no significant lesions EYES: conjunctiva are pink and non-injected, sclera anicteric OROPHARYNX: MMM, no exudates, no oropharyngeal erythema or ulceration NECK: supple, no JVD LYMPH: Couple small palpable lymph nodes in b/l axillary, no palpable lymphadenopathy in the cervical or inguinal regions LUNGS: clear to auscultation b/l with normal respiratory effort HEART: regular rate & rhythm ABDOMEN:  normoactive  bowel sounds , non tender, not distended. No palpable hepatosplenomegaly.  Extremity: 1+ pedal edema PSYCH: alert & oriented x 3 with fluent speech NEURO: no focal motor/sensory deficits   LABORATORY DATA:   .[name] Kitchen CBC Latest Ref Rng & Units 03/13/2019 12/24/2018 12/13/2018  WBC 4.0 - 10.5 K/uL 82.8(HH) 59.4(H) 66.5(HH)  Hemoglobin 12.0 - 15.0 g/dL 11.9(L) 11.5(L) 11.4(L)  Hematocrit 36.0 - 46.0 % 37.0 34.2(L) 34.8(L)  Platelets 150 - 400 K/uL 76(L) 77(L) 78(L)   . CBC    Component Value Date/Time   WBC 82.8 (HH) 03/13/2019 1356   RBC 3.83 (L) 03/13/2019 1356   HGB 11.9 (L) 03/13/2019 1356   HGB 11.4 (L) 12/13/2018 1440   HGB 11.2 (L) 11/08/2017 1418   HCT 37.0 03/13/2019 1356   HCT 34.8 11/08/2017 1418   PLT 76 (L) 03/13/2019 1356   PLT 78 (L) 12/13/2018 1440   PLT 140 (L) 11/08/2017 1418   MCV 96.6 03/13/2019 1356   MCV 96.4 11/08/2017 1418   MCH 31.1 03/13/2019 1356   MCHC 32.2  03/13/2019 1356   RDW 14.0 03/13/2019 1356   RDW 14.8 (H) 11/08/2017 1418   LYMPHSABS 76.7 (H) 03/13/2019 1356   LYMPHSABS 87.0 (H) 11/08/2017 1418   MONOABS 1.8 (H) 03/13/2019 1356   MONOABS 1.5 (H) 11/08/2017 1418   EOSABS 0.3 03/13/2019 1356   EOSABS 0.6 (H) 11/08/2017 1418   BASOSABS 0.0 03/13/2019 1356   BASOSABS 0.2 (H) 11/08/2017 1418      CMP Latest Ref Rng & Units 03/13/2019 12/24/2018 12/13/2018  Glucose 70 - 99 mg/dL 107(H) 92 91  BUN 8 - 23 mg/dL 19 19 14   Creatinine 0.44 - 1.00 mg/dL 0.84 0.86 0.80  Sodium 135 - 145 mmol/L 138 138 135  Potassium 3.5 - 5.1 mmol/L 3.7 3.6 3.7  Chloride 98 - 111 mmol/L 105 102 100  CO2 22 - 32 mmol/L 23 23 23   Calcium 8.9 - 10.3 mg/dL 9.0 9.3 8.9  Total Protein 6.5 - 8.1 g/dL 6.6 5.9(L) 6.4(L)  Total Bilirubin 0.3 - 1.2 mg/dL 0.6 0.6 0.9  Alkaline Phos 38 - 126 U/L 64 - 62  AST 15 - 41 U/L 28 20 24   ALT 0 - 44 U/L 19 14 16       RADIOGRAPHIC STUDIES: I have personally reviewed the radiological images as listed and agreed with the findings in the report.  CT ABDOMEN AND PELVIS WITHOUT CONTRAST   IMPRESSION: 1. No acute abnormalities within the abdomen or pelvis. 2. Mild splenomegaly and several prominent to mildly enlarged pelvic and inguinal lymph nodes. These findings are similar to the prior CT. 3. Small nonobstructing stone in the lower pole the right kidney. No ureteral stones or obstructive uropathy.   Electronically Signed   By: Lajean Manes M.D.   On: 11/14/2016 16:39    ASSESSMENT & PLAN:   73 y.o.  caucasian female with   1.Rai Stage 2 previously - now Stage IV CLL with lymphocytosis, LNadenopathy and mild splenomegaly with anemia and thrombocytopenia. -patient has previously and continues to desire holding off CLL directed treatment as long as possible.   Lab Results  Component Value Date   LDH 233 (H) 12/13/2018   PLAN: -Discussed pt labwork today, 03/13/19; WBC increased from 59.4k 2 months ago to  82.8k today. Her HGB is stable at 11.9, and her PLT are stable at 76k. Chemistries are stable. -Discussed again the pt's  thrombocytopenia is an indication to consider initiating treatment. However given the Covid19  pandemic and addition to her general preference to delay treatment, it is reasonable to hold off initiating treatment at this time, which the pt prefers to do. -Also discussed that there may be other causes of her fatigue including unresolved back pain, knee pain, depression, and narcotic use -Will see the pt back in 3 months  2.  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 Has been taking good infection prevention precautions. CLL initially diagnosed 2009 with 13q deletion. Only intervention thus far has been intermittent IVIG, clinically very helpful with decrease in respiratory infections.  Last imaging was CT AP 02-2015. --showed no evidence of Splenomegaly. Negative Hep B serology 2015  No overt new constitutional symptoms except mild unchanged fatigue. Has had significant recurrent headaches with IVIG - discontinued  3. Mild Anemia Hgb hemoglobin stable in the 10.5-11 range. Hgb increased to 11.9 on 03/13/19  4. Moderate thrombocytopenia PLT count at 76k and has recently been in the 80-90k range.  5. H/o  Recurrent cutaneous SCC s/p Mohs surgery for SCC of the nosehealed. No issues with infection-   her 13q deletion places her at risk for recurrent SCC. -continue close f/u with dermatologist for evaluation and management of non melanoma skin cancers that can be increased in patient with CLL with 13q deletion.   RTC with Dr Irene Limbo with labs in 3 months   The total time spent in the appt was 25 minutes and more than 50% was on counseling and direct patient cares.    Sullivan Lone MD [place] AAHIVMS [facility] Hematology/Oncology Physician [facility]  (Office):       [phone] (Work cell):  [phone] (Fax):           [phone]  I, Baldwin [place], am acting as a  scribe for Dr. Sullivan Lone.   .I have reviewed the above documentation for accuracy and completeness, and I agree with the above. Brunetta Genera MD

## 2019-03-13 NOTE — Telephone Encounter (Signed)
Scheduled appt per 4/8 los. °

## 2019-03-14 LAB — LACTATE DEHYDROGENASE: LDH: 190 U/L (ref 98–192)

## 2019-03-27 ENCOUNTER — Ambulatory Visit: Payer: Self-pay | Admitting: Internal Medicine

## 2019-04-08 NOTE — Progress Notes (Deleted)
Assessment and Plan:   Essential hypertension If any CP, SOB, HA, dizziness, etc go to ER, patient expressed understands -     CBC with Differential/Platelet -     BASIC METABOLIC PANEL WITH GFR -     Hepatic function panel -     TSH  Mixed hyperlipidemia -continue medications, check lipids, decrease fatty foods, increase activity.  -     Lipid panel  CLL (chronic lymphocytic leukemia) ([place]) Continue follow up oncology  Morbid Obesity with co morbidities - long discussion about weight loss, diet, and exercise  Prediabetes -     Hemoglobin A1c  Diarrhea Improving, needs note for work, benign AB If worse follow up  Back pain Follow up ortho Suggest water therapy  Continue diet and meds as discussed. Further disposition pending results of labs. OVER 30 minutes of exam, counseling, chart review, referral performed   HPI 73 y.o. female  presents for over due 3 month follow up - follows up with hypertension, hyperlipidemia, prediabetes, Vitamin D, and complicated history of CLL.   Due to [name] and immunodef, she has chronic fatigue and states she does better if she can work [employer], [employer], Friday and feels better if she has rest Wednesday. She works as [employer], takes calls, sits all day but can be very stressful. Most recently she has been sick x Sunday, her husband has the same thing. She has had diarrhea, headache and weakness, she has been out of work x Monday. She did not have diarrhea today but continues with weakness/HA. Had WBC checked the 9th and went down from 126.2 to 66.5.   Patient was dx'd with CLL in Dec 2009 and been monitored by Dr. Irene Limbo. She could not tolerate the IVIG.  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 She has been having fatigue, night sweats. She is going to start IV rituximab, once a day for 4 weeks but she states that after reading the side effects she would not like to. She is going to follow up with Dr. Melanee Spry on the 27th.   Lab Results  Component  Value Date   WBC 82.8 (HH) 03/13/2019   HGB 11.9 (L) 03/13/2019   HCT 37.0 03/13/2019   MCV 96.6 03/13/2019   PLT 76 (L) 03/13/2019    She follows with Dr. Silverio Decamp for back pain,  She had left L4-5 extraforaminal decompression and microdiscectomy in Nov with Dr. Ronnald Ramp. Has follow up. Suggest water therapy, can go on Wednesday or after 430.    She does not workout. She denies chest pain, shortness of breath, dizziness.  She is on cholesterol medication and denies myalgias. Her cholesterol is at goal. The cholesterol last visit was:   Lab Results  Component Value Date   CHOL 202 (H) 08/22/2018   HDL 67 08/22/2018   LDLCALC 117 (H) 08/22/2018   TRIG 83 08/22/2018   CHOLHDL 3.0 08/22/2018    She has been working on diet and exercise for prediabetes, and denies paresthesia of the feet, polydipsia, polyuria and visual disturbances. Last A1C in the office was:  Lab Results  Component Value Date   HGBA1C 5.5 08/22/2018   Patient is on Vitamin D supplement.   Lab Results  Component Value Date   VD25OH 88 08/22/2018     BMI is There is no height or weight on file to calculate BMI., she is working on diet and exercise.+ OSA on CPAP.  Wt Readings from Last 3 Encounters:  03/13/19 204 lb 8 oz (  92.8 kg)  02/06/19 199 lb 12.8 oz (90.6 kg)  01/25/19 199 lb 9.6 oz (90.5 kg)   Current Medications:  Current Outpatient Medications on File Prior to Visit  Medication Sig  . ALPRAZolam (XANAX) 1 MG tablet TAKE 1/2 TO 1 TABLET BY MOUTH THREE TIMES A DAY AS NEEDED  . Ascorbic Acid (VITAMIN C) 1000 MG tablet Take 1,500 mg by mouth daily.   . bisoprolol-hydrochlorothiazide (ZIAC) 10-6.25 MG tablet TAKE ONE TABLET BY MOUTH DAILY  . Cholecalciferol (D 2000) 2000 units TABS Take 10,000 Units by mouth daily.   . cholestyramine light (PREVALITE) 4 g packet Take 1 packet (4 g total) by mouth 2 (two) times daily.  [name] Kitchen escitalopram (LEXAPRO) 10 MG tablet TAKE ONE TABLET BY MOUTH DAILY FOR MOOD  . ezetimibe  (ZETIA) 10 MG tablet TAKE ONE TABLET BY MOUTH DAILY  . famotidine (PEPCID) 40 MG tablet Take 1 tablet daily for Heartburn & Reflux  . fexofenadine (ALLEGRA) 180 MG tablet Take 1 tablet (180 mg total) by mouth daily. Take PRN for allergies.  . Hyoscyamine Sulfate (HYOSCYAMINE PO) Take by mouth.  Vladimir Faster Glycol-Propyl Glycol (SYSTANE OP) Apply 2 drops daily to eye.  . Probiotic Product (PROBIOTIC PO) Take 1 tablet daily by mouth.  . promethazine-codeine (PHENERGAN WITH CODEINE) 6.25-10 MG/5ML syrup Take 5 mLs by mouth every 6 (six) hours as needed for cough. Max: 30mL per day (Patient not taking: Reported on 02/06/2019)   No current facility-administered medications on file prior to visit.    Medical History:  Past Medical History:  Diagnosis Date  . Anemia   . Anxiety   . Arthritis   . CLL (chronic lymphocytic leukemia) ([place]) dx'd ~ 2015  . Depression   . GERD (gastroesophageal reflux disease)   . Headache    "sinus headaches"  . History of kidney stones   . Hypertension   . OSA (obstructive sleep apnea)   . Pneumonia   . Prediabetes   . Recurrent sinus infections 08/02/2012   Allergies:  Allergies  Allergen Reactions  . Hyzaar [Losartan Potassium-Hctz] Other (See Comments)    "messed up my sodium counts" swells up lips.  [name] [Pseudoephedrine Hcl] Palpitations  . Levaquin [Levofloxacin In D5w] Diarrhea and Nausea Only  . Acyclovir And Related   . Biaxin [Clarithromycin]     GI Upset  . Celexa [Citalopram]   . Flexeril [Cyclobenzaprine]     "zombie-like" feeling  . Fosamax [Alendronate Sodium]     GI upset  . Iohexol Hives     Code: HIVES, Desc: pt states she broke out in hive 20 yrs ago from IV contrast.     . Losartan     Angioedema  . Meloxicam     GI upset  . Norvasc [Amlodipine] Swelling  . Pseudoephedrine     Palpitations  . Zoloft [Sertraline Hcl] Other (See Comments)    Has no emotions at all      Review of Systems:  Review of Systems   Constitutional: Positive for malaise/fatigue. Negative for chills, diaphoresis, fever and weight loss.  HENT: Positive for congestion and sinus pain. Negative for ear discharge, ear pain, hearing loss, nosebleeds, sore throat and tinnitus.        + TMJ  Eyes: Negative.  Negative for blurred vision and double vision.  Respiratory: Positive for cough and shortness of breath. Negative for hemoptysis, sputum production, wheezing and stridor.   Cardiovascular: Positive for chest pain (pleuritc), orthopnea and leg swelling. Negative for  palpitations, claudication and PND.  Gastrointestinal: Positive for abdominal pain, constipation, diarrhea and heartburn. Negative for blood in stool, melena, nausea and vomiting.  Genitourinary: Negative for dysuria, flank pain, frequency, hematuria and urgency.  Musculoskeletal: Positive for joint pain (left shoulder). Negative for back pain, falls, myalgias and neck pain.  Skin: Negative.   Neurological: Negative for dizziness, tingling, tremors, sensory change, speech change, focal weakness, seizures, loss of consciousness, weakness and headaches.  Endo/Heme/Allergies: Negative for environmental allergies and polydipsia. Does not bruise/bleed easily.  Psychiatric/Behavioral: Negative for depression, hallucinations, memory loss, substance abuse and suicidal ideas. The patient is nervous/anxious. The patient does not have insomnia.     Family history- Review and unchanged Social history- Review and unchanged Physical Exam: There were no vitals taken for this visit. Wt Readings from Last 3 Encounters:  03/13/19 204 lb 8 oz (92.8 kg)  02/06/19 199 lb 12.8 oz (90.6 kg)  01/25/19 199 lb 9.6 oz (90.5 kg)   General Appearance: Well nourished, in no apparent distress, no diaphroesis Eyes: PERRLA, EOMs, conjunctiva no swelling or erythema Sinuses: + Frontal/maxillary tenderness ENT/Mouth: Ext aud canals clear, TMs without erythema, bulging. No erythema, swelling, or  exudate on post pharynx.  Tonsils not swollen or erythematous. Hearing normal. + TMJ tenderness Neck: Supple, thyroid normal.  Respiratory: Respiratory effort normal, BS equal bilaterally without rales, rhonchi, wheezing or stridor.  Cardio: RRR with no MRGs. Brisk peripheral pulses without edema.  Abdomen: Soft, + BS, obese, diffusely overly tender to palpation, no peritoneal signs, + guarding, without rebound, hernias, masses. Lymphatics: Non tender without lymphadenopathy.  Musculoskeletal: Full ROM, 5/5 strength, Normal gait, Strength is normal and symmetric in arms. Skin:  Warm, dry without rashes, lesions, ecchymosis.  Neuro: Cranial nerves intact. Normal muscle tone, no cerebellar symptoms. Psych: Awake and oriented X 3, normal affect, appears anxious, Insight and Judgment appropriate.    Vicie Mutters, PA-C 1:28 PM [facility]

## 2019-04-10 ENCOUNTER — Ambulatory Visit: Payer: Self-pay | Admitting: Physician Assistant

## 2019-05-16 ENCOUNTER — Ambulatory Visit: Payer: 59 | Admitting: Physician Assistant

## 2019-05-21 ENCOUNTER — Ambulatory Visit: Payer: 59 | Admitting: Internal Medicine

## 2019-05-21 ENCOUNTER — Other Ambulatory Visit: Payer: Self-pay

## 2019-05-21 VITALS — BP 180/82 | Ht 62.0 in | Wt 205.0 lb

## 2019-05-21 DIAGNOSIS — R197 Diarrhea, unspecified: Secondary | ICD-10-CM

## 2019-05-21 DIAGNOSIS — K529 Noninfective gastroenteritis and colitis, unspecified: Secondary | ICD-10-CM

## 2019-05-21 DIAGNOSIS — R112 Nausea with vomiting, unspecified: Secondary | ICD-10-CM | POA: Diagnosis not present

## 2019-05-21 MED ORDER — ONDANSETRON 8 MG PO TBDP: ORAL_TABLET | ORAL | 0 refills | Status: DC

## 2019-05-21 MED ORDER — HYOSCYAMINE SULFATE 0.125 MG SL SUBL: SUBLINGUAL_TABLET | SUBLINGUAL | 0 refills | Status: DC

## 2019-05-21 NOTE — Progress Notes (Signed)
THIS ENCOUNTER IS A VIRTUAL VISIT DUE TO COVID-19 - PATIENT WAS NOT SEEN IN THE OFFICE.  PATIENT HAS CONSENTED TO VIRTUAL VISIT / TELEMEDICINE VISIT  Virtual Visit via telephone Note     I connected with  Rebecca Bond   on 05/21/2019 05/21/2019  by telephone.  I verified that I am speaking with the correct person using two identifiers.      I discussed the limitations of evaluation and management by telemedicine and the availability of in person appointments. The patient expressed understanding and agreed to proceed.  History of Present Illness:     Patient is a very nice 73 yo MWF with HTN, venous insufficiency,  HLD, CLL, Prediabetes  and Vitamin D Deficiency who presents with 2 day hx/o sub-acute onset of  N/V and subsequent Diarrhea , which she reports is improving. She reports minimal emesis using an old Rx of Hyoscyamine.  Has had mild heartburn , no definite reflux, hematemesis, and no blood in BM's.  She reports some cramping, but rep;ates her abdomen is soft , and not tender or distended.   Medications .  bisoprolol-hydrochlorothiazide (ZIAC) 10-6.25 MG tablet, TAKE ONE TABLET BY MOUTH DAILY .  cholestyramine light (PREVALITE) 4 g packet, Take 1 packet (4 g total) by mouth 2 (two) times daily. [name] Kitchen  ezetimibe (ZETIA) 10 MG tablet, TAKE ONE TABLET BY MOUTH DAILY .  fexofenadine (ALLEGRA) 180 MG tablet, Take 1 tablet (180 mg total) by mouth daily. Take PRN for allergies. .  ALPRAZolam (XANAX) 1 MG tablet, TAKE 1/2 TO 1 TABLET BY MOUTH THREE TIMES A DAY AS NEEDED .  Ascorbic Acid (VITAMIN C) 1000 MG tablet, Take 1,500 mg by mouth daily.  .  Cholecalciferol (D 2000) 2000 units TABS, Take 10,000 Units by mouth daily.  [name] Kitchen  escitalopram (LEXAPRO) 10 MG tablet, TAKE ONE TABLET BY MOUTH DAILY FOR MOOD .  famotidine (PEPCID) 40 MG tablet, Take 1 tablet daily for Heartburn & Reflux .  Hyoscyamine Sulfate (HYOSCYAMINE PO)*, Take by mouth. Vladimir Faster Glycol-Propyl Glycol (SYSTANE OP), Apply 2  drops daily to eye. .  Probiotic Product (PROBIOTIC PO), Take 1 tablet daily by mouth. * These medications belong to multiple therapeutic classes and are listed under each applicable group.  Problem list She has CLL (chronic lymphocytic leukemia) ([place]); Hepatitis A; Morbid obesity (BMI 37.95); Angioedema secondary to ACE/ARB; Abnormal glucose; Depression, major, in remission ([place]); GERD (gastroesophageal reflux disease); Recurrent sinus infections; OSA (obstructive sleep apnea); HTN (hypertension); Vitamin D deficiency; Hypogammaglobulinemia ([place]); Mixed hyperlipidemia; Medication management; Environmental and seasonal allergies; BMI 37.85,   adult; Immunocompromised ([place]); Thrombocytopenia ([place]); S/P lumbar laminectomy; and Counseling regarding advance care planning and goals of care on their problem list.   Observations/Objective:  BP (!) 180/82   Wt 205 lb (93 kg)   BMI 37.49 kg/m   General : Well sounding patient in no apparent distress HEENT: no hoarseness, no cough for duration of visit Lungs: speaks in complete sentences, no audible wheezing, no apparent distress Neurological: alert, oriented x 3 Psychiatric: pleasant, judgement appropriate   Assessment and Plan:  1. Gastroenteritis  - hyoscyamine (LEVSIN SL) 0.125 MG SL tablet; Take 1 to 2 tablets 3 to 4 x day if needed for Nausea, vomiting, cramping or diarrhea  Dispense: 90 tablet; Refill: 0 - ondansetron (ZOFRAN ODT) 8 MG disintegrating tablet; Dissolve 1 tablet under tongue every 6 hours for nausea  or vomitting  Dispense: 30 tablet; Refill: 0  2. Nausea vomiting and diarrhea -  hyoscyamine (LEVSIN SL) 0.125 MG SL tablet; Take 1 to 2 tablets 3 to 4 x day if needed for Nausea, vomiting, cramping or diarrhea  Dispense: 90 tablet  - ondansetron (ZOFRAN ODT) 8 MG disintegrating tablet; Dissolve 1 tablet under tongue every 6 hours for nausea  or vomitting  Dispense: 30 tablet  Follow Up Instructions:  - Discussed prudent diet,  encouraging liberal fluids.  I discussed the assessment and treatment plan with the patient. The patient was provided an opportunity to ask questions and all were answered. The patient agreed with the plan and demonstrated an understanding of the instructions. The patient was advised to call back or seek an in-person evaluation if the symptoms worsen or if the condition fails to improve as anticipated.  I provided 16 minutes of non-face-to-face time during this encounter & 23 minutes of virtual exam, counseling, chart review, and critical decision making was performed  - Also note patient's BP was elevated which she indicated the BP cuff was old and she questioned the accuracy of there monitor. She was encouraged to recheck her BP and compare to a standard cuff as at a drug store . Also advised to call if  BP remains elevated.  Kirtland Bouchard, MD

## 2019-05-24 ENCOUNTER — Other Ambulatory Visit: Payer: Self-pay | Admitting: Adult Health

## 2019-05-24 DIAGNOSIS — F411 Generalized anxiety disorder: Secondary | ICD-10-CM

## 2019-05-26 ENCOUNTER — Encounter: Payer: Self-pay | Admitting: Internal Medicine

## 2019-05-28 NOTE — Progress Notes (Signed)
Assessment and Plan:  Double vision Pain in left eye- doing steroid nasal spray and levsin- concern for glaucoma, Eye exam with soft globe, PERRLA, optic disk no seen well during eye exam in the office, normal blood vessels left eye.  Will call and get appointment tomorrow ER precautions discussed with patient Will send note to Dr. Katy Fitch  Essential hypertension If any CP, SOB, HA, dizziness, etc go to ER, patient expressed understands -     CBC with Differential/Platelet -     BASIC METABOLIC PANEL WITH GFR -     Hepatic function panel -     TSH  Mixed hyperlipidemia -continue medications, check lipids, decrease fatty foods, increase activity.  -     Lipid panel  CLL (chronic lymphocytic leukemia) ([place]) Continue follow up oncology  Morbid Obesity with co morbidities - long discussion about weight loss, diet, and exercise  Abnormal glucose -     Hemoglobin A1c   Continue diet and meds as discussed. Further disposition pending results of labs. OVER 30 minutes of exam, counseling, chart review, referral performed   HPI 73 y.o. female  presents for over due 3 month follow up - follows up with hypertension, hyperlipidemia, prediabetes, Vitamin D, and complicated history of CLL.   She follows Dr. Midge Aver, saw him in Feb for conjunctivitis, refilled meds but states that she has been having double vision left eye, vertical, states cars were on top of each other, was better on her right side lying and worse on her left side. She is being monitored for glaucoma.She has been having left eye pain, pressure. Having ear pain in the left ear. Taken Levsin x 06/16 and 06/17, has been doing steroid nasal spray for x march, double vision happened Monday.   Patient was dx'd with CLL in Dec 2009 and been monitored by Dr. Irene Limbo. She could not tolerate the IVIG.  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 She  is going to follow up with Dr. Melanee Spry on the 27th.   Lab Results  Component Value Date   WBC 82.8 (HH) 03/13/2019   HGB 11.9 (L) 03/13/2019   HCT 37.0 03/13/2019   MCV 96.6 03/13/2019   PLT 76 (L) 03/13/2019    She does not workout. She denies chest pain, shortness of breath, dizziness.  She is on cholesterol medication and denies myalgias. Her cholesterol is at goal. The cholesterol last visit was:   Lab Results  Component Value Date   CHOL 202 (H) 08/22/2018   HDL 67 08/22/2018   LDLCALC 117 (H) 08/22/2018   TRIG 83 08/22/2018   CHOLHDL 3.0 08/22/2018    She has been working on diet and exercise for prediabetes, and denies paresthesia of the feet, polydipsia, polyuria and visual disturbances. Last A1C in the office was:  Lab Results  Component Value Date   HGBA1C 5.5 08/22/2018   Patient is on Vitamin D supplement.   Lab Results  Component Value Date   VD25OH 88 08/22/2018     BMI is Body mass index is 39.03 kg/m., she is working on diet and exercise.+ OSA on CPAP.  Wt Readings from Last 3 Encounters:  05/29/19 213 lb 6.4 oz (96.8 kg)  05/21/19 205 lb (93 kg)  03/13/19 204 lb 8 oz (92.8 kg)   Current Medications:  Current Outpatient Medications on File Prior to Visit  Medication Sig  . ALPRAZolam Duanne Moron) 1  MG tablet TAKE 1/2 TO 1 TABLET BY MOUTH THREE TIMES A DAY AS NEEDED  . Ascorbic Acid (VITAMIN C) 1000 MG tablet Take 1,500 mg by mouth daily.   . bisoprolol-hydrochlorothiazide (ZIAC) 10-6.25 MG tablet TAKE ONE TABLET BY MOUTH DAILY  . Cholecalciferol (D 2000) 2000 units TABS Take 10,000 Units by mouth daily.   . cholestyramine light (PREVALITE) 4 g packet Take 1 packet (4 g total) by mouth 2 (two) times daily.  [name] Kitchen escitalopram (LEXAPRO) 10 MG tablet TAKE ONE TABLET BY MOUTH DAILY FOR MOOD  . famotidine (PEPCID) 40 MG tablet Take 1 tablet daily for Heartburn & Reflux  . fexofenadine (ALLEGRA) 180 MG tablet Take 1 tablet (180 mg total) by mouth daily. Take PRN for  allergies.  . fluorometholone (FML) 0.1 % ophthalmic suspension SHAKE LQ AND INT 1 GTT IN OU BID  . hyoscyamine (LEVSIN SL) 0.125 MG SL tablet Take 1 to 2 tablets 3 to 4 x day if needed for Nausea, vomiting, cramping or diarrhea  . Olopatadine HCl 0.2 % SOLN INT 1 GTT IN OU QD  . Probiotic Product (PROBIOTIC PO) Take 1 tablet daily by mouth.   No current facility-administered medications on file prior to visit.    Medical History:  Past Medical History:  Diagnosis Date  . Anemia   . Anxiety   . Arthritis   . CLL (chronic lymphocytic leukemia) ([place]) dx'd ~ 2015  . Depression   . GERD (gastroesophageal reflux disease)   . Headache    "sinus headaches"  . History of kidney stones   . Hypertension   . OSA (obstructive sleep apnea)   . Pneumonia   . Prediabetes   . Recurrent sinus infections 08/02/2012   Allergies:  Allergies  Allergen Reactions  . Hyzaar [Losartan Potassium-Hctz] Other (See Comments)    "messed up my sodium counts" swells up lips.  [name] [Pseudoephedrine Hcl] Palpitations  . Levaquin [Levofloxacin In D5w] Diarrhea and Nausea Only  . Acyclovir And Related   . Biaxin [Clarithromycin]     GI Upset  . Celexa [Citalopram]   . Flexeril [Cyclobenzaprine]     "zombie-like" feeling  . Fosamax [Alendronate Sodium]     GI upset  . Iohexol Hives     Code: HIVES, Desc: pt states she broke out in hive 20 yrs ago from IV contrast.     . Losartan     Angioedema  . Meloxicam     GI upset  . Norvasc [Amlodipine] Swelling  . Pseudoephedrine     Palpitations  . Zoloft [Sertraline Hcl] Other (See Comments)    Has no emotions at all      Review of Systems:  Review of Systems  Constitutional: Positive for malaise/fatigue. Negative for chills, diaphoresis, fever and weight loss.  HENT: Positive for sinus pain. Negative for congestion, ear discharge, ear pain, hearing loss, nosebleeds, sore throat and tinnitus.        + TMJ  Eyes: Positive for double vision and  pain. Negative for blurred vision, photophobia and discharge.  Respiratory: Negative for cough, hemoptysis, sputum production, wheezing and stridor.   Cardiovascular: Positive for leg swelling. Negative for chest pain (pleuritc), palpitations, orthopnea, claudication and PND.  Gastrointestinal: Negative for abdominal pain, blood in stool, constipation, diarrhea, heartburn, melena, nausea and vomiting.  Genitourinary: Negative for dysuria, flank pain, frequency, hematuria and urgency.  Musculoskeletal: Positive for joint pain (left shoulder). Negative for back pain, falls, myalgias and neck pain.  Skin: Negative.  Neurological: Negative for dizziness, tingling, tremors, sensory change, speech change, focal weakness, seizures, loss of consciousness, weakness and headaches.  Endo/Heme/Allergies: Negative for environmental allergies and polydipsia. Does not bruise/bleed easily.  Psychiatric/Behavioral: Negative for depression, hallucinations, memory loss, substance abuse and suicidal ideas. The patient is nervous/anxious. The patient does not have insomnia.     Family history- Review and unchanged Social history- Review and unchanged Physical Exam: BP 136/80   Pulse 72   Temp 97.7 F (36.5 C)   Ht 5\' 2"  (1.575 m)   Wt 213 lb 6.4 oz (96.8 kg)   SpO2 97%   BMI 39.03 kg/m  Wt Readings from Last 3 Encounters:  05/29/19 213 lb 6.4 oz (96.8 kg)  05/21/19 205 lb (93 kg)  03/13/19 204 lb 8 oz (92.8 kg)   General Appearance: Well nourished, in no apparent distress, no diaphroesis Eyes: PERRLA, EOMs, conjunctiva no swelling or erythema, + left eye tenderness to palpation but soft globe Fundus limited but normal Sinuses: + Frontal/maxillary tenderness ENT/Mouth: Ext aud canals clear, TMs without erythema, bulging. No erythema, swelling, or exudate on post pharynx.  Tonsils not swollen or erythematous. Hearing normal. + TMJ tenderness Neck: Supple, thyroid normal.  Respiratory: Respiratory effort  normal, BS equal bilaterally without rales, rhonchi, wheezing or stridor.  Cardio: RRR with no MRGs. Brisk peripheral pulses without edema.  Abdomen: Soft, + BS, obese, diffusely overly tender to palpation,  without rebound, hernias, masses. Lymphatics: Non tender without lymphadenopathy.  Musculoskeletal: Full ROM, 5/5 strength, Normal gait, Strength is normal and symmetric in arms. Skin:  Warm, dry without rashes, lesions, ecchymosis.  Neuro: Cranial nerves intact. Normal muscle tone, no cerebellar symptoms. Psych: Awake and oriented X 3, normal affect, appears anxious, Insight and Judgment appropriate.    Vicie Mutters, PA-C 4:15 PM [facility]

## 2019-05-29 ENCOUNTER — Ambulatory Visit: Payer: 59 | Admitting: Physician Assistant

## 2019-05-29 ENCOUNTER — Other Ambulatory Visit: Payer: Self-pay

## 2019-05-29 ENCOUNTER — Encounter: Payer: Self-pay | Admitting: Physician Assistant

## 2019-05-29 ENCOUNTER — Other Ambulatory Visit: Payer: Self-pay | Admitting: Internal Medicine

## 2019-05-29 VITALS — BP 136/80 | HR 72 | Temp 97.7°F | Ht 62.0 in | Wt 213.4 lb

## 2019-05-29 DIAGNOSIS — F325 Major depressive disorder, single episode, in full remission: Secondary | ICD-10-CM | POA: Diagnosis not present

## 2019-05-29 DIAGNOSIS — R7309 Other abnormal glucose: Secondary | ICD-10-CM | POA: Diagnosis not present

## 2019-05-29 DIAGNOSIS — Z79899 Other long term (current) drug therapy: Secondary | ICD-10-CM

## 2019-05-29 DIAGNOSIS — C9110 Chronic lymphocytic leukemia of B-cell type not having achieved remission: Secondary | ICD-10-CM

## 2019-05-29 DIAGNOSIS — I10 Essential (primary) hypertension: Secondary | ICD-10-CM

## 2019-05-29 DIAGNOSIS — E559 Vitamin D deficiency, unspecified: Secondary | ICD-10-CM

## 2019-05-29 DIAGNOSIS — E782 Mixed hyperlipidemia: Secondary | ICD-10-CM

## 2019-05-29 DIAGNOSIS — D696 Thrombocytopenia, unspecified: Secondary | ICD-10-CM

## 2019-05-29 NOTE — Patient Instructions (Signed)
Stop the steroid spray, stop the levsin, get appointment tomorrow with eye doctor Go to ER if any acute vision loss, severe headache, nausea, vomiting.   Get help right away if:  You have severe pain in your affected eye.  You have problems with your vision.  You have a bad headache in the area around your eye.  You develop nausea and vomiting.  The same or similar symptoms develop in your other eye.  Glaucoma  Glaucoma is a condition that is caused by high pressure inside the eye. The pressure in the eye is raised because fluid (aqueous humor) within the eye cannot get out through the normal drainage system. The increased pressure can cause damage to the nerves of the eye, and that can result in loss of vision. It is important to diagnose glaucoma early before damage occurs. Early treatment can often prevent vision loss. There are two main types of glaucoma:  Open-angle glaucoma. This is a long-term (chronic) condition that causes the pressure inside the eye to rise slowly. This is the most common type of glaucoma. In many cases, it does not cause symptoms until later in the disease.  Acute angle-closure glaucoma. With this type, the pressure inside the eye rises suddenly to a very high level. This causes severe pain and requires treatment right away. What are the causes? In many cases, the cause of this condition is not known. Glaucoma can sometimes result from other diseases, such as infection, cataracts, or tumors. What increases the risk? The following factors may make you more likely to develop this condition:  Being older than age 68.  Being of African-American descent.  Having high blood pressure or diabetes.  Having a family history of glaucoma.  Having a previous eye injury or eye surgery.  Being farsighted.  Using certain medicines that increase pressure in the eyes or cause the pupils to widen (dilate). What are the signs or symptoms? Open-angle glaucoma often  causes no symptoms early on. If it is not treated, the condition will get worse and may cause a loss of side vision (peripheral vision). This can advance to tunnel vision, which means that you are able to see straight ahead but you have a loss of peripheral vision in all directions. Eventually, total loss of vision can occur. Symptoms of acute angle-closure glaucoma develop suddenly and may include:  Cloudy vision.  Severe pain in your affected eye.  Severe headache in the area around your eye.  Feeling nauseous.  Vomiting. How is this diagnosed? This condition is diagnosed through an eye exam by an eye specialist (ophthalmologist). This specialist will:  Perform a test to measure the pressure in your eye (tonometry).  Do eye tests to check your vision, especially your peripheral vision.  Use various instruments to look inside your eye and check for damage to the nerves. Because glaucoma often does not cause symptoms until late in the disease, it is often found during a routine eye exam. Be sure to have your eyes checked regularly. How is this treated? Early treatment is important to prevent vision loss. Treatment will focus on lowering the pressure in your eyes. This usually involves using medicines in the form of eye drops. The type of medicine will depend on how bad the disease is and the degree of damage. Laser treatments or other types of surgery may be needed in some cases. Acute angle-closure glaucoma requires emergency treatment to help prevent vision loss. This treatment may involve eye drops and medicines that  are given in pill form or through an IV. Surgery is often done after the pressure has been lowered with medicine. Follow these instructions at home:  Take over-the-counter and prescription medicines only as told by your health care provider. ? If you were given eye drops, use them exactly as instructed. It is likely that you will need to use this medicine for the rest of  your life.  Get regular exercise, but talk with your health care provider about which types of exercise are safe for you. You may need to avoid yoga or other types of exercise that may involve standing on your head.  Keep all follow-up visits as told by your health care provider. This is important. Contact a health care provider if:  You have new or worsening symptoms. Summary  Glaucoma is a condition that is caused by high pressure inside the eye. The increased pressure can cause damage to the nerves of the eye, and that can result in loss of vision.  It is important to diagnose glaucoma early before damage occurs. Early treatment can often prevent vision loss.  Open-angle glaucoma often causes no symptoms early on. If it is not treated, the condition will get worse and may cause a loss of vision.  Because glaucoma often does not cause symptoms until late in the disease, it is often found during a routine eye exam. Be sure to have your eyes checked regularly. This information is not intended to replace advice given to you by your health care provider. Make sure you discuss any questions you have with your health care provider. Document Released: 11/21/2005 Document Revised: 07/20/2017 Document Reviewed: 07/20/2017 Elsevier Interactive Patient Education  2019 [employer].

## 2019-05-30 LAB — VITAMIN D 25 HYDROXY (VIT D DEFICIENCY, FRACTURES): Vit D, 25-Hydroxy: 59 ng/mL (ref 30–100)

## 2019-05-30 LAB — LIPID PANEL
Cholesterol: 209 mg/dL — ABNORMAL HIGH (ref <200)
HDL: 66 mg/dL (ref >=50)
LDL Cholesterol (Calc): 124 mg/dL (calc) — ABNORMAL HIGH
Non-HDL Cholesterol (Calc): 143 mg/dL (calc) — ABNORMAL HIGH (ref <130)
Total CHOL/HDL Ratio: 3.2 (calc) (ref <5.0)
Triglycerides: 90 mg/dL (ref <150)

## 2019-05-30 LAB — CBC WITH DIFFERENTIAL/PLATELET
Absolute Monocytes: 2002 cells/uL — ABNORMAL HIGH (ref 200–950)
Basophils Absolute: 0 cells/uL (ref 0–200)
Basophils Relative: 0 %
Eosinophils Absolute: 0 cells/uL — ABNORMAL LOW (ref 15–500)
Eosinophils Relative: 0 %
HCT: 31.4 % — ABNORMAL LOW (ref 35.0–45.0)
Hemoglobin: 10.4 g/dL — ABNORMAL LOW (ref 11.7–15.5)
Lymphs Abs: 115467 cells/uL — ABNORMAL HIGH (ref 850–3900)
MCH: 32.3 pg (ref 27.0–33.0)
MCHC: 33.1 g/dL (ref 32.0–36.0)
MCV: 97.5 fL (ref 80.0–100.0)
MPV: 9 fL (ref 7.5–12.5)
Monocytes Relative: 1.6 %
Neutro Abs: 7631 cells/uL (ref 1500–7800)
Neutrophils Relative %: 6.1 %
Platelets: 124 10*3/uL — ABNORMAL LOW (ref 140–400)
RBC: 3.22 10*6/uL — ABNORMAL LOW (ref 3.80–5.10)
RDW: 13.5 % (ref 11.0–15.0)
Total Lymphocyte: 92.3 %
WBC: 125.1 10*3/uL — ABNORMAL HIGH (ref 3.8–10.8)

## 2019-05-30 LAB — COMPLETE METABOLIC PANEL WITH GFR
AG Ratio: 2.5 (calc) (ref 1.0–2.5)
ALT: 19 U/L (ref 6–29)
AST: 19 U/L (ref 10–35)
Albumin: 4.3 g/dL (ref 3.6–5.1)
Alkaline phosphatase (APISO): 56 U/L (ref 37–153)
BUN: 25 mg/dL (ref 7–25)
CO2: 25 mmol/L (ref 20–32)
Calcium: 9.4 mg/dL (ref 8.6–10.4)
Chloride: 98 mmol/L (ref 98–110)
Creat: 0.76 mg/dL (ref 0.60–0.93)
GFR, Est African American: 91 mL/min/{1.73_m2} (ref >=60)
GFR, Est Non African American: 78 mL/min/{1.73_m2} (ref >=60)
Globulin: 1.7 g/dL (calc) — ABNORMAL LOW (ref 1.9–3.7)
Glucose, Bld: 107 mg/dL — ABNORMAL HIGH (ref 65–99)
Potassium: 3.9 mmol/L (ref 3.5–5.3)
Sodium: 129 mmol/L — ABNORMAL LOW (ref 135–146)
Total Bilirubin: 0.6 mg/dL (ref 0.2–1.2)
Total Protein: 6 g/dL — ABNORMAL LOW (ref 6.1–8.1)

## 2019-05-30 LAB — HEMOGLOBIN A1C
Hgb A1c MFr Bld: 4.9 % of total Hgb (ref <5.7)
Mean Plasma Glucose: 94 (calc)
eAG (mmol/L): 5.2 (calc)

## 2019-05-30 LAB — TSH: TSH: 0.65 mIU/L (ref 0.40–4.50)

## 2019-05-30 LAB — MAGNESIUM: Magnesium: 1.6 mg/dL (ref 1.5–2.5)

## 2019-06-03 ENCOUNTER — Other Ambulatory Visit: Payer: Self-pay | Admitting: Adult Health

## 2019-06-11 NOTE — Progress Notes (Signed)
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  INTERVAL HISTORY:  Rebecca Bond returns today for management and evaluation of [name]. The patient's last visit with [place] was on 03/13/2019. The pt reports that she is doing well overall.  The pt reports that she had a bout with vomiting and nausea about two weeks ago. She saw her PCP who felt that the pt has gastroenteritis.   In the interim, the pt had steroid injections in her hip, two and three weeks ago respectively.   The pt also endorses having bad allergies recently. She had some blurry vision which has improved after steroids.  The pt denies any new abnormal bleeding or bruising.  The pt also denies noticing any new lumps or bumps and denies any fevers, chills, night sweats or unexpected weight loss. She endorses stable energy levels and notes that her back pain and bursitis is better. She continues working full time and endorses "feeling pretty good."  Lab results today (06/12/2019) of CBC w/diff, Reticulocytes, and CMP is as follows: all values are  WNL except for WBC at 87.6k, HGB at 10.8, HCT at 35.0, MCV at 103.2, PLT at 68k, Lymphs abs at 80.6k, Monocytes abs at 1.2k, Abs immature granulocytes at 0.29k, Immature Retic fract at 19.4%, Sodium at 134, Glucose at 102, BUN at 24, Total Protein at 6.3. 06/12/19 LDH is . Lab Results  Component Value Date   LDH 197 (H) 06/12/2019    On review of systems, pt reports recent infection, improved hip pain, good energy levels, improved back pain, and denies abnormal bleeding, abnormal bruising, fevers, chills, drenching night sweats, unexpected weight loss, noticing any new lumps or bumps, concerns for infections, and any other symptoms.   REVIEW OF SYSTEMS:    A 10+ POINT REVIEW OF SYSTEMS WAS OBTAINED including neurology, dermatology, psychiatry, cardiac, respiratory, lymph, extremities, GI, GU, Musculoskeletal, constitutional, breasts, reproductive, HEENT.  All pertinent positives are noted in the HPI.  All others are negative.   . Past Medical History:  Diagnosis Date  . Anemia   . Anxiety   . Arthritis   . CLL (chronic lymphocytic leukemia) ([place]) dx'd ~ 2015  . Depression   . GERD (gastroesophageal reflux disease)   . Headache    "sinus headaches"  . History of kidney stones   . Hypertension   . OSA (obstructive sleep apnea)   . Pneumonia   . Prediabetes   . Recurrent sinus infections 08/02/2012    . Past Surgical History:  Procedure Laterality Date  . ABDOMINAL HYSTERECTOMY  1980's   "endometrosis"  . APPENDECTOMY    . BREAST SURGERY    . CATARACT EXTRACTION, BILATERAL    .  CHOLECYSTECTOMY  1985  . FUNCTIONAL ENDOSCOPIC SINUS SURGERY  1990's   "cause I kept having sinus infections"  . immunoglobulin treatment  2017  . LAPAROSCOPIC APPENDECTOMY N/A 06/04/2014   Procedure: APPENDECTOMY LAPAROSCOPIC;  Surgeon: Zenovia Jarred, MD;  Location: [facility];  Service: General;  Laterality: N/A;  . LUMBAR LAMINECTOMY/DECOMPRESSION MICRODISCECTOMY Left 11/01/2017   Procedure: Left  Lumbar Four-Five Extraforaminal Microdiscectomy;  Surgeon: Eustace Moore, MD;  Location: [facility];  Service: Neurosurgery;  Laterality: Left;  . LYMPH NODE BIOPSY     "determined I had CLL"  . SHOULDER ARTHROSCOPY Right   . TEMPOROMANDIBULAR JOINT ARTHROPLASTY  1980's  . TONSILLECTOMY AND ADENOIDECTOMY  1950's    . Social History   Tobacco Use  . Smoking status: Former Smoker    Packs/day: 0.75    Years: 4.00    Pack years: 3.00    Types: Cigarettes    Quit date: 12/05/1968    Years since quitting: 50.5  . Smokeless tobacco: Never Used  Substance Use Topics  . Alcohol use: Yes    Alcohol/week: 7.0 standard drinks    Types: 7 Glasses of wine per week  . Drug use: No    ALLERGIES:  is allergic to hyzaar [losartan potassium-hctz]; sudafed [pseudoephedrine hcl]; levaquin [levofloxacin in d5w]; acyclovir and related; biaxin [clarithromycin]; celexa [citalopram]; flexeril [cyclobenzaprine]; fosamax [alendronate sodium]; iohexol; losartan; meloxicam; norvasc [amlodipine]; pseudoephedrine; and zoloft [sertraline hcl].  MEDICATIONS:  Current Outpatient Medications  Medication Sig Dispense Refill  . ALPRAZolam (XANAX) 1 MG tablet TAKE 1/2 TO 1 TABLET BY MOUTH THREE TIMES A DAY AS NEEDED 90 tablet 0  . Ascorbic Acid (VITAMIN C) 1000 MG tablet Take 1,500 mg by mouth daily.     . bisoprolol-hydrochlorothiazide (ZIAC) 10-6.25 MG tablet TAKE ONE TABLET BY MOUTH DAILY 90 tablet 0  . Cholecalciferol (D 2000) 2000 units TABS Take 10,000 Units by mouth daily.     . cholestyramine light (PREVALITE) 4 g packet Take 1 packet (4 g total) by mouth 2 (two) times daily. 60 packet 0  . escitalopram (LEXAPRO) 10 MG tablet TAKE ONE TABLET BY MOUTH DAILY FOR MOOD 30 tablet 5  . ezetimibe (ZETIA) 10 MG tablet Take 1 tablet Daily for Cholesterol 90 tablet 3  . famotidine (PEPCID) 40 MG tablet Take 1 tablet daily for Heartburn & Reflux 90 tablet 3  . fexofenadine (ALLEGRA) 180 MG tablet Take 1 tablet (180 mg  total) by mouth daily. Take PRN for allergies. 30 tablet 6  . fluorometholone (FML) 0.1 % ophthalmic suspension SHAKE LQ AND INT 1 GTT IN OU BID    . Olopatadine HCl 0.2 % SOLN INT 1 GTT IN OU QD    . Probiotic Product (PROBIOTIC PO) Take 1 tablet daily by mouth.     No current facility-administered medications for this visit.     PHYSICAL EXAMINATION: ECOG PERFORMANCE STATUS: 1 - Symptomatic but completely ambulatory  Vitals:   06/12/19 1451  BP: (!) 155/66  Pulse: 67  Resp: 18  Temp: 98.7 F (37.1 C)  SpO2: 97%    Filed Weights   06/12/19 1451  Weight: 209 lb 8 oz (95 kg)   .Body mass index is 38.32 kg/m.  GENERAL:alert, in no acute distress and comfortable SKIN: no acute rashes, no significant lesions EYES: conjunctiva are pink and non-injected, sclera anicteric OROPHARYNX: MMM, no exudates, no oropharyngeal erythema or ulceration NECK: supple, no JVD LYMPH:  Couple small palpable lymph nodes in b/l axillary, no palpable lymphadenopathy in  the cervical or inguinal regions LUNGS: clear to auscultation b/l with normal respiratory effort HEART: regular rate & rhythm ABDOMEN:  normoactive bowel sounds , non tender, not distended. No palpable hepatosplenomegaly.  Extremity: +1 pedal edema  PSYCH: alert & oriented x 3 with fluent speech NEURO: no focal motor/sensory deficits    LABORATORY DATA:   .[name] Kitchen CBC Latest Ref Rng & Units 06/12/2019 05/29/2019 03/13/2019  WBC 4.0 - 10.5 K/uL 87.6(HH) 125.1(H) 82.8(HH)  Hemoglobin 12.0 - 15.0 g/dL 10.8(L) 10.4(L) 11.9(L)  Hematocrit 36.0 - 46.0 % 35.0(L) 31.4(L) 37.0  Platelets 150 - 400 K/uL 68(L) 124(L) 76(L)   . CBC    Component Value Date/Time   WBC 87.6 (HH) 06/12/2019 1402   RBC 3.34 (L) 06/12/2019 1402   RBC 3.39 (L) 06/12/2019 1402   HGB 10.8 (L) 06/12/2019 1402   HGB 11.4 (L) 12/13/2018 1440   HGB 11.2 (L) 11/08/2017 1418   HCT 35.0 (L) 06/12/2019 1402   HCT 34.8 11/08/2017 1418   PLT 68 (L) 06/12/2019 1402   PLT 78  (L) 12/13/2018 1440   PLT 140 (L) 11/08/2017 1418   MCV 103.2 (H) 06/12/2019 1402   MCV 96.4 11/08/2017 1418   MCH 31.9 06/12/2019 1402   MCHC 30.9 06/12/2019 1402   RDW 13.6 06/12/2019 1402   RDW 14.8 (H) 11/08/2017 1418   LYMPHSABS 80.6 (H) 06/12/2019 1402   LYMPHSABS 87.0 (H) 11/08/2017 1418   MONOABS 1.2 (H) 06/12/2019 1402   MONOABS 1.5 (H) 11/08/2017 1418   EOSABS 0.1 06/12/2019 1402   EOSABS 0.6 (H) 11/08/2017 1418   BASOSABS 0.0 06/12/2019 1402   BASOSABS 0.2 (H) 11/08/2017 1418      CMP Latest Ref Rng & Units 06/12/2019 05/29/2019 03/13/2019  Glucose 70 - 99 mg/dL 102(H) 107(H) 107(H)  BUN 8 - 23 mg/dL 24(H) 25 19  Creatinine 0.44 - 1.00 mg/dL 0.83 0.76 0.84  Sodium 135 - 145 mmol/L 134(L) 129(L) 138  Potassium 3.5 - 5.1 mmol/L 3.6 3.9 3.7  Chloride 98 - 111 mmol/L 100 98 105  CO2 22 - 32 mmol/L 23 25 23   Calcium 8.9 - 10.3 mg/dL 9.0 9.4 9.0  Total Protein 6.5 - 8.1 g/dL 6.3(L) 6.0(L) 6.6  Total Bilirubin 0.3 - 1.2 mg/dL 0.7 0.6 0.6  Alkaline Phos 38 - 126 U/L 64 - 64  AST 15 - 41 U/L 29 19 28   ALT 0 - 44 U/L 27 19 19       RADIOGRAPHIC STUDIES: I have personally reviewed the radiological images as listed and agreed with the findings in the report.  CT ABDOMEN AND PELVIS WITHOUT CONTRAST   IMPRESSION: 1. No acute abnormalities within the abdomen or pelvis. 2. Mild splenomegaly and several prominent to mildly enlarged pelvic and inguinal lymph nodes. These findings are similar to the prior CT. 3. Small nonobstructing stone in the lower pole the right kidney. No ureteral stones or obstructive uropathy.   Electronically Signed   By: Lajean Manes M.D.   On: 11/14/2016 16:39    ASSESSMENT & PLAN:   73 y.o.  caucasian female with   1.Rai Stage 2 previously - now Stage IV CLL with lymphocytosis, LNadenopathy and mild splenomegaly with anemia and thrombocytopenia. -patient has previously and continues to desire holding off CLL directed treatment as long  as possible.   Lab Results  Component Value Date   LDH 197 (H) 06/12/2019   PLAN: -Discussed pt labwork today, 06/12/2019; WBC increased two weeks ago in setting of gastroenteritis. WBC  decreased to 87.6k today, similar to her last visit with [place] 3 months ago. HGB at 10.8 and PLT at 68k,. LDH at 197k. Overall, her blood counts are stable as compared to three months ago. -Also recent steroids injections in the last 2-3 weeks affecting blood counts -Will continue to watch platelets. -Discussed again the pt's  thrombocytopenia is an indication to consider initiating treatment. However given the [place] pandemic and addition to her general preference to delay treatment, it is reasonable to hold off initiating treatment at this time, which the pt prefers to do. -Will see the pt back in 3 months, sooner if any new concerns   2.  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 Has been taking good infection prevention precautions. CLL initially diagnosed 2009 with 13q deletion. Only intervention thus far has been intermittent IVIG, clinically very helpful with decrease in respiratory infections.  Last imaging was CT AP 02-2015. --showed no evidence of Splenomegaly. Negative Hep B serology 2015  No overt new constitutional symptoms except mild unchanged fatigue. Has had significant recurrent headaches with IVIG - discontinued  3. Mild Anemia Hgb hemoglobin stable in the 10.5-11 range.  4. Moderate thrombocytopenia PLT count at 68k and has recently been in the 80-90k range.  5. H/o  Recurrent cutaneous SCC s/p Mohs surgery for SCC of the nosehealed. No issues with infection-   her 13q deletion places her at risk for recurrent SCC. -continue close f/u with dermatologist for evaluation and management of non melanoma skin cancers that can be increased in patient with CLL with 13q deletion.   RTC with Dr kale in 3 months   The total time spent in the appt was 25 minutes and more than 50% was on counseling  and direct patient cares.    Sullivan Lone MD [place] AAHIVMS [facility] Hematology/Oncology Physician [facility]  (Office):       [phone] (Work cell):  [phone] (Fax):           [phone]  I, Baldwin [place], am acting as a scribe for Dr. Sullivan Lone.   .I have reviewed the above documentation for accuracy and completeness, and I agree with the above. Brunetta Genera MD

## 2019-06-12 ENCOUNTER — Inpatient Hospital Stay: Payer: 59 | Attending: Hematology

## 2019-06-12 ENCOUNTER — Other Ambulatory Visit: Payer: Self-pay

## 2019-06-12 ENCOUNTER — Inpatient Hospital Stay (HOSPITAL_BASED_OUTPATIENT_CLINIC_OR_DEPARTMENT_OTHER): Payer: 59 | Admitting: Hematology

## 2019-06-12 VITALS — BP 155/66 | HR 67 | Temp 98.7°F | Resp 18 | Ht 62.0 in | Wt 209.5 lb

## 2019-06-12 DIAGNOSIS — C9110 Chronic lymphocytic leukemia of B-cell type not having achieved remission: Secondary | ICD-10-CM

## 2019-06-12 DIAGNOSIS — I10 Essential (primary) hypertension: Secondary | ICD-10-CM

## 2019-06-12 DIAGNOSIS — Z87891 Personal history of nicotine dependence: Secondary | ICD-10-CM

## 2019-06-12 DIAGNOSIS — G4733 Obstructive sleep apnea (adult) (pediatric): Secondary | ICD-10-CM

## 2019-06-12 DIAGNOSIS — Z79899 Other long term (current) drug therapy: Secondary | ICD-10-CM

## 2019-06-12 DIAGNOSIS — D649 Anemia, unspecified: Secondary | ICD-10-CM | POA: Insufficient documentation

## 2019-06-12 DIAGNOSIS — F329 Major depressive disorder, single episode, unspecified: Secondary | ICD-10-CM | POA: Diagnosis not present

## 2019-06-12 LAB — CBC WITH DIFFERENTIAL/PLATELET
Abs Immature Granulocytes: 0.29 10*3/uL — ABNORMAL HIGH (ref 0.00–0.07)
Basophils Absolute: 0 10*3/uL (ref 0.0–0.1)
Basophils Relative: 0 %
Eosinophils Absolute: 0.1 10*3/uL (ref 0.0–0.5)
Eosinophils Relative: 0 %
HCT: 35 % — ABNORMAL LOW (ref 36.0–46.0)
Hemoglobin: 10.8 g/dL — ABNORMAL LOW (ref 12.0–15.0)
Immature Granulocytes: 0 %
Lymphocytes Relative: 93 %
Lymphs Abs: 80.6 10*3/uL — ABNORMAL HIGH (ref 0.7–4.0)
MCH: 31.9 pg (ref 26.0–34.0)
MCHC: 30.9 g/dL (ref 30.0–36.0)
MCV: 103.2 fL — ABNORMAL HIGH (ref 80.0–100.0)
Monocytes Absolute: 1.2 10*3/uL — ABNORMAL HIGH (ref 0.1–1.0)
Monocytes Relative: 1 %
Neutro Abs: 5.4 10*3/uL (ref 1.7–7.7)
Neutrophils Relative %: 6 %
Platelets: 68 10*3/uL — ABNORMAL LOW (ref 150–400)
RBC: 3.39 MIL/uL — ABNORMAL LOW (ref 3.87–5.11)
RDW: 13.6 % (ref 11.5–15.5)
WBC: 87.6 10*3/uL (ref 4.0–10.5)
nRBC: 0 % (ref 0.0–0.2)

## 2019-06-12 LAB — CMP (CANCER CENTER ONLY)
ALT: 27 U/L (ref 0–44)
AST: 29 U/L (ref 15–41)
Albumin: 4.1 g/dL (ref 3.5–5.0)
Alkaline Phosphatase: 64 U/L (ref 38–126)
Anion gap: 11 (ref 5–15)
BUN: 24 mg/dL — ABNORMAL HIGH (ref 8–23)
CO2: 23 mmol/L (ref 22–32)
Calcium: 9 mg/dL (ref 8.9–10.3)
Chloride: 100 mmol/L (ref 98–111)
Creatinine: 0.83 mg/dL (ref 0.44–1.00)
GFR, Est AFR Am: >60 mL/min (ref >60)
GFR, Estimated: >60 mL/min (ref >60)
Glucose, Bld: 102 mg/dL — ABNORMAL HIGH (ref 70–99)
Potassium: 3.6 mmol/L (ref 3.5–5.1)
Sodium: 134 mmol/L — ABNORMAL LOW (ref 135–145)
Total Bilirubin: 0.7 mg/dL (ref 0.3–1.2)
Total Protein: 6.3 g/dL — ABNORMAL LOW (ref 6.5–8.1)

## 2019-06-12 LAB — RETICULOCYTES
Immature Retic Fract: 19.4 % — ABNORMAL HIGH (ref 2.3–15.9)
RBC.: 3.34 MIL/uL — ABNORMAL LOW (ref 3.87–5.11)
Retic Count, Absolute: 67.5 10*3/uL (ref 19.0–186.0)
Retic Ct Pct: 2 % (ref 0.4–3.1)

## 2019-06-12 LAB — LACTATE DEHYDROGENASE: LDH: 197 U/L — ABNORMAL HIGH (ref 98–192)

## 2019-06-13 ENCOUNTER — Telehealth: Payer: Self-pay | Admitting: Hematology

## 2019-06-13 NOTE — Telephone Encounter (Signed)
Scheduled appt per 7/8 los. Printed and mailed appt calendar,

## 2019-06-19 ENCOUNTER — Ambulatory Visit: Payer: Self-pay | Admitting: Physician Assistant

## 2019-06-23 ENCOUNTER — Other Ambulatory Visit: Payer: Self-pay | Admitting: Internal Medicine

## 2019-06-24 ENCOUNTER — Other Ambulatory Visit: Payer: Self-pay | Admitting: Internal Medicine

## 2019-06-24 NOTE — Progress Notes (Deleted)
Assessment and Plan:  Double vision Pain in left eye- doing steroid nasal spray and levsin- concern for glaucoma, Eye exam with soft globe, PERRLA, optic disk no seen well during eye exam in the office, normal blood vessels left eye.  Will call and get appointment tomorrow ER precautions discussed with patient Will send note to Dr. Katy Fitch  Mixed hyperlipidemia -continue medications, check lipids, decrease fatty foods, increase activity.  -     Lipid panel  CLL (chronic lymphocytic leukemia) ([place]) Continue follow up oncology  Continue diet and meds as discussed. Further disposition pending results of labs. OVER 30 minutes of exam, counseling, chart review, referral performed   HPI 73 y.o. female  presents for 2 week  She follows Dr. Midge Aver, saw him in Feb for conjunctivitis, refilled meds but states that she has been having double vision left eye, vertical, states cars were on top of each other, was better on her right side lying and worse on her left side. She is being monitored for glaucoma.She has been having left eye pain, pressure. Having ear pain in the left ear. Taken Levsin x 06/16 and 06/17, has been doing steroid nasal spray for x march, double vision happened Monday.   Patient was dx'd with CLL in Dec 2009 and been monitored by Dr. Irene Limbo. She could not tolerate the IVIG.  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 She is going to follow up with Dr. Melanee Spry on the 27th.   Lab Results  Component Value Date   WBC 87.6 (HH) 06/12/2019   HGB 10.8 (L) 06/12/2019   HCT 35.0 (L) 06/12/2019   MCV 103.2 (H) 06/12/2019   PLT 68 (L) 06/12/2019    She does not workout. She denies chest pain, shortness of breath, dizziness.  She is on cholesterol medication and denies myalgias. Her cholesterol is at goal. The cholesterol last visit was:      BMI is There is no height or weight on file to calculate  BMI., she is working on diet and exercise.+ OSA on CPAP.  Wt Readings from Last 3 Encounters:  06/12/19 209 lb 8 oz (95 kg)  05/29/19 213 lb 6.4 oz (96.8 kg)  05/21/19 205 lb (93 kg)   Current Medications:  Current Outpatient Medications on File Prior to Visit  Medication Sig  . ALPRAZolam (XANAX) 1 MG tablet TAKE 1/2 TO 1 TABLET BY MOUTH THREE TIMES A DAY AS NEEDED  . Ascorbic Acid (VITAMIN C) 1000 MG tablet Take 1,500 mg by mouth daily.   . bisoprolol-hydrochlorothiazide (ZIAC) 10-6.25 MG tablet Take 1 tablet Daily for BP  . Cholecalciferol (D 2000) 2000 units TABS Take 10,000 Units by mouth daily.   . cholestyramine light (PREVALITE) 4 g packet Take 1 packet (4 g total) by mouth 2 (two) times daily.  [name] Kitchen escitalopram (LEXAPRO) 10 MG tablet TAKE ONE TABLET BY MOUTH DAILY FOR MOOD  . ezetimibe (ZETIA) 10 MG tablet Take 1 tablet Daily for Cholesterol  . famotidine (PEPCID) 40 MG tablet Take 1 tablet daily for Heartburn & Reflux  . fexofenadine (ALLEGRA) 180 MG tablet Take 1 tablet (180 mg total) by mouth daily. Take PRN for allergies.  . fluorometholone (FML) 0.1 % ophthalmic suspension SHAKE LQ AND INT 1 GTT IN OU BID  . Olopatadine HCl 0.2 % SOLN INT 1 GTT IN OU QD  . Probiotic Product (PROBIOTIC PO) Take 1 tablet daily by  mouth.   No current facility-administered medications on file prior to visit.    Medical History:  Past Medical History:  Diagnosis Date  . Anemia   . Anxiety   . Arthritis   . CLL (chronic lymphocytic leukemia) ([place]) dx'd ~ 2015  . Depression   . GERD (gastroesophageal reflux disease)   . Headache    "sinus headaches"  . History of kidney stones   . Hypertension   . OSA (obstructive sleep apnea)   . Pneumonia   . Prediabetes   . Recurrent sinus infections 08/02/2012   Allergies:  Allergies  Allergen Reactions  . Hyzaar [Losartan Potassium-Hctz] Other (See Comments)    "messed up my sodium counts" swells up lips.  [name] [Pseudoephedrine Hcl]  Palpitations  . Levaquin [Levofloxacin In D5w] Diarrhea and Nausea Only  . Acyclovir And Related   . Biaxin [Clarithromycin]     GI Upset  . Celexa [Citalopram]   . Flexeril [Cyclobenzaprine]     "zombie-like" feeling  . Fosamax [Alendronate Sodium]     GI upset  . Iohexol Hives     Code: HIVES, Desc: pt states she broke out in hive 20 yrs ago from IV contrast.     . Losartan     Angioedema  . Meloxicam     GI upset  . Norvasc [Amlodipine] Swelling  . Pseudoephedrine     Palpitations  . Zoloft [Sertraline Hcl] Other (See Comments)    Has no emotions at all      Review of Systems:  Review of Systems  Constitutional: Positive for malaise/fatigue. Negative for chills, diaphoresis, fever and weight loss.  HENT: Positive for sinus pain. Negative for congestion, ear discharge, ear pain, hearing loss, nosebleeds, sore throat and tinnitus.        + TMJ  Eyes: Positive for double vision and pain. Negative for blurred vision, photophobia and discharge.  Respiratory: Negative for cough, hemoptysis, sputum production, wheezing and stridor.   Cardiovascular: Positive for leg swelling. Negative for chest pain (pleuritc), palpitations, orthopnea, claudication and PND.  Gastrointestinal: Negative for abdominal pain, blood in stool, constipation, diarrhea, heartburn, melena, nausea and vomiting.  Genitourinary: Negative for dysuria, flank pain, frequency, hematuria and urgency.  Musculoskeletal: Positive for joint pain (left shoulder). Negative for back pain, falls, myalgias and neck pain.  Skin: Negative.   Neurological: Negative for dizziness, tingling, tremors, sensory change, speech change, focal weakness, seizures, loss of consciousness, weakness and headaches.  Endo/Heme/Allergies: Negative for environmental allergies and polydipsia. Does not bruise/bleed easily.  Psychiatric/Behavioral: Negative for depression, hallucinations, memory loss, substance abuse and suicidal ideas. The patient  is nervous/anxious. The patient does not have insomnia.     Family history- Review and unchanged Social history- Review and unchanged Physical Exam: There were no vitals taken for this visit. Wt Readings from Last 3 Encounters:  06/12/19 209 lb 8 oz (95 kg)  05/29/19 213 lb 6.4 oz (96.8 kg)  05/21/19 205 lb (93 kg)   General Appearance: Well nourished, in no apparent distress, no diaphroesis Eyes: PERRLA, EOMs, conjunctiva no swelling or erythema, + left eye tenderness to palpation but soft globe Fundus limited but normal Sinuses: + Frontal/maxillary tenderness ENT/Mouth: Ext aud canals clear, TMs without erythema, bulging. No erythema, swelling, or exudate on post pharynx.  Tonsils not swollen or erythematous. Hearing normal. + TMJ tenderness Neck: Supple, thyroid normal.  Respiratory: Respiratory effort normal, BS equal bilaterally without rales, rhonchi, wheezing or stridor.  Cardio: RRR with no MRGs. Brisk peripheral  pulses without edema.  Abdomen: Soft, + BS, obese, diffusely overly tender to palpation,  without rebound, hernias, masses. Lymphatics: Non tender without lymphadenopathy.  Musculoskeletal: Full ROM, 5/5 strength, Normal gait, Strength is normal and symmetric in arms. Skin:  Warm, dry without rashes, lesions, ecchymosis.  Neuro: Cranial nerves intact. Normal muscle tone, no cerebellar symptoms. Psych: Awake and oriented X 3, normal affect, appears anxious, Insight and Judgment appropriate.    Vicie Mutters, PA-C 10:00 AM [facility]

## 2019-06-26 ENCOUNTER — Ambulatory Visit: Payer: Self-pay | Admitting: Physician Assistant

## 2019-07-10 ENCOUNTER — Other Ambulatory Visit: Payer: Self-pay | Admitting: Internal Medicine

## 2019-07-10 ENCOUNTER — Ambulatory Visit: Payer: 59 | Admitting: Physician Assistant

## 2019-07-10 ENCOUNTER — Other Ambulatory Visit: Payer: Self-pay

## 2019-07-10 ENCOUNTER — Encounter: Payer: Self-pay | Admitting: Physician Assistant

## 2019-07-10 VITALS — BP 134/86 | HR 59 | Temp 97.7°F | Ht 62.0 in | Wt 213.8 lb

## 2019-07-10 DIAGNOSIS — K580 Irritable bowel syndrome with diarrhea: Secondary | ICD-10-CM

## 2019-07-10 DIAGNOSIS — K219 Gastro-esophageal reflux disease without esophagitis: Secondary | ICD-10-CM

## 2019-07-10 DIAGNOSIS — C9110 Chronic lymphocytic leukemia of B-cell type not having achieved remission: Secondary | ICD-10-CM

## 2019-07-10 MED ORDER — ONDANSETRON HCL 4 MG PO TABS: ORAL_TABLET | ORAL | 1 refills | Status: DC

## 2019-07-10 MED ORDER — TRIAMCINOLONE ACETONIDE 0.5 % EX CREA: 1.0000 "application " | TOPICAL_CREAM | Freq: Two times a day (BID) | CUTANEOUS | 2 refills | Status: DC

## 2019-07-10 MED ORDER — OMEPRAZOLE 40 MG PO CPDR: 40.0000 mg | DELAYED_RELEASE_CAPSULE | Freq: Every day | ORAL | 1 refills | Status: DC

## 2019-07-10 NOTE — Patient Instructions (Addendum)
Look up peppermint oil for IBS on the internet Can get from [facility] or health food store.   I want you do do zofran 4 mg every day.   Cut back on your vitamin C to 500mg  a day- can cause worse symptoms  Suggest follow up with G doctorI if not better  Irritable Bowel Syndrome, Adult  Irritable bowel syndrome (IBS) is a group of symptoms that affects the organs responsible for digestion (gastrointestinal or GI tract). IBS is not one specific disease. To regulate how the GI tract works, the body sends signals back and forth between the intestines and the brain. If you have IBS, there may be a problem with these signals. As a result, the GI tract does not function normally. The intestines may become more sensitive and overreact to certain things. This may be especially true when you eat certain foods or when you are under stress. There are four types of IBS. These may be determined based on the consistency of your stool (feces):  IBS with diarrhea.  IBS with constipation.  Mixed IBS.  Unsubtyped IBS. It is important to know which type of IBS you have. Certain treatments are more likely to be helpful for certain types of IBS. What are the causes? The exact cause of IBS is not known. What increases the risk? You may have a higher risk for IBS if you:  Are female.  Are younger than 51.  Have a family history of IBS.  Have a mental health condition, such as depression, anxiety, or post-traumatic stress disorder.  Have had a bacterial infection of your GI tract. What are the signs or symptoms? Symptoms of IBS vary from person to person. The main symptom is abdominal pain or discomfort. Other symptoms usually include one or more of the following:  Diarrhea, constipation, or both.  Abdominal swelling or bloating.  Feeling full after eating a small or regular-sized meal.  Frequent gas.  Mucus in the stool.  A feeling of having more stool left after a bowel movement. Symptoms  tend to come and go. They may be triggered by stress, mental health conditions, or certain foods. How is this diagnosed? This condition may be diagnosed based on a physical exam, your medical history, and your symptoms. You may have tests, such as:  Blood tests.  Stool test.  X-rays.  CT scan.  Colonoscopy. This is a procedure in which your GI tract is viewed with a long, thin, flexible tube. How is this treated? There is no cure for IBS, but treatment can help relieve symptoms. Treatment depends on the type of IBS you have, and may include:  Changes to your diet, such as: ? Avoiding foods that cause symptoms. ? Drinking more water. ? Following a low-FODMAP (fermentable oligosaccharides, disaccharides, monosaccharides, and polyols) diet for up to 6 weeks, or as told by your health care provider. FODMAPs are sugars that are hard for some people to digest. ? Eating more fiber. ? Eating medium-sized meals at the same times every day.  Medicines. These may include: ? Fiber supplements, if you have constipation. ? Medicine to control diarrhea (antidiarrheal medicines). ? Medicine to help control muscle tightening (spasms) in your GI tract (antispasmodic medicines). ? Medicines to help with mental health conditions, such as antidepressants or tranquilizers.  Talk therapy or counseling.  Working with a diet and nutrition specialist (dietitian) to help create a food plan that is right for you.  Managing your stress. Follow these instructions at home: Eating and  drinking  Eat a healthy diet.  Eat medium-sized meals at about the same time every day. Do not eat large meals.  Gradually eat more fiber-rich foods. These include whole grains, fruits, and vegetables. This may be especially helpful if you have IBS with constipation.  Eat a diet low in FODMAPs.  Drink enough fluid to keep your urine pale yellow.  Keep a journal of foods that seem to trigger symptoms.  Avoid foods and  drinks that: ? Contain added sugar. ? Make your symptoms worse. Dairy products, caffeinated drinks, and carbonated drinks can make symptoms worse for some people. General instructions  Take over-the-counter and prescription medicines and supplements only as told by your health care provider.  Get enough exercise. Do at least 150 minutes of moderate-intensity exercise each week.  Manage your stress. Getting enough sleep and exercise can help you manage stress.  Keep all follow-up visits as told by your health care provider and therapist. This is important. Alcohol Use  Do not drink alcohol if: ? Your health care provider tells you not to drink. ? You are pregnant, may be pregnant, or are planning to become pregnant.  If you drink alcohol, limit how much you have: ? 0-1 drink a day for women. ? 0-2 drinks a day for men.  Be aware of how much alcohol is in your drink. In the [place] one drink equals one typical bottle of beer (12 oz), one-half glass of wine (5 oz), or one shot of hard liquor (1 oz). Contact a health care provider if you have:  Constant pain.  Weight loss.  Difficulty or pain when swallowing.  Diarrhea that gets worse. Get help right away if you have:  Severe abdominal pain.  Fever.  Diarrhea with symptoms of dehydration, such as dizziness or dry mouth.  Bright red blood in your stool.  Stool that is black and tarry.  Abdominal swelling.  Vomiting that does not stop.  Blood in your vomit. Summary  Irritable bowel syndrome (IBS) is not one specific disease. It is a group of symptoms that affects digestion.  Your intestines may become more sensitive and overreact to certain things. This may be especially true when you eat certain foods or when you are under stress.  There is no cure for IBS, but treatment can help relieve symptoms. This information is not intended to replace advice given to you by your health care provider. Make sure you discuss any  questions you have with your health care provider. Document Released: 11/21/2005 Document Revised: 11/14/2017 Document Reviewed: 11/14/2017 Elsevier Patient Education  2020 [employer].

## 2019-07-10 NOTE — Progress Notes (Signed)
Subjective:    Patient ID: Rebecca Bond, female    DOB: 02-May-1946, 73 y.o.   MRN: [id number]  HPI 73 y.o. obese WF presents for follow up.  She has had worsening GERD and dry cough, she started on omeprazole 40 mg and states that it is doing better. She has continued to have hoarseness but also has cough/sneezing.   Her sodium was low but was checked by Dr. Irene Limbo and was at goal.   She has had diarrhea but has noticed that she can not do [place], fried foods, cheese, raw  veggies but can if it is cooked. She will go to bed 9-10, she will eat late an hour before bed. Will take prevalite occ but still has to take imodium occ. States with BM's she will occ have fecal incontinence, she will have pressure and she will have to "do disimpaction occ too".   Blood pressure 134/86, pulse (!) 59, temperature 97.7 F (36.5 C), height 5\' 2"  (1.575 m), weight 213 lb 12.8 oz (97 kg), SpO2 97 %.  Medications Current Outpatient Medications on File Prior to Visit  Medication Sig  . ALPRAZolam (XANAX) 1 MG tablet TAKE 1/2 TO 1 TABLET BY MOUTH THREE TIMES A DAY AS NEEDED  . Ascorbic Acid (VITAMIN C) 1000 MG tablet Take 1,500 mg by mouth daily.   . bisoprolol-hydrochlorothiazide (ZIAC) 10-6.25 MG tablet Take 1 tablet Daily for BP  . Cholecalciferol (D 2000) 2000 units TABS Take 10,000 Units by mouth daily.   . cholestyramine light (PREVALITE) 4 g packet Take 1 packet (4 g total) by mouth 2 (two) times daily.  [name] Kitchen escitalopram (LEXAPRO) 10 MG tablet TAKE ONE TABLET BY MOUTH DAILY FOR MOOD  . ezetimibe (ZETIA) 10 MG tablet Take 1 tablet Daily for Cholesterol  . famotidine (PEPCID) 40 MG tablet Take 1 tablet daily for Heartburn & Reflux  . fexofenadine (ALLEGRA) 180 MG tablet Take 1 tablet (180 mg total) by mouth daily. Take PRN for allergies.  . fluorometholone (FML) 0.1 % ophthalmic suspension SHAKE LQ AND INT 1 GTT IN OU BID  . Olopatadine HCl 0.2 % SOLN INT 1 GTT IN OU QD  . Probiotic Product  (PROBIOTIC PO) Take 1 tablet daily by mouth.   No current facility-administered medications on file prior to visit.     Problem list She has CLL (chronic lymphocytic leukemia) ([place]); Hepatitis A; Morbid obesity (BMI 37.95); Angioedema secondary to ACE/ARB; Abnormal glucose; Depression, major, in remission ([place]); GERD (gastroesophageal reflux disease); Recurrent sinus infections; OSA (obstructive sleep apnea); HTN (hypertension); Vitamin D deficiency; Hypogammaglobulinemia ([place]); Mixed hyperlipidemia; Medication management; Environmental and seasonal allergies; BMI 37.85,   adult; Immunocompromised ([place]); Thrombocytopenia ([place]); S/P lumbar laminectomy; and Counseling regarding advance care planning and goals of care on their problem list.   Review of Systems  Constitutional: Negative for chills, diaphoresis, fatigue and fever.  HENT: Negative.   Respiratory: Negative.  Negative for cough.   Cardiovascular: Negative.   Gastrointestinal: Positive for abdominal distention, abdominal pain, diarrhea and nausea. Negative for anal bleeding, blood in stool, constipation, rectal pain and vomiting.       + GERD  Genitourinary: Negative for difficulty urinating, dysuria, flank pain, frequency, hematuria, pelvic pain and urgency.  Musculoskeletal: Positive for back pain. Negative for arthralgias, gait problem, joint swelling, myalgias, neck pain and neck stiffness.  Skin: Negative.   Neurological: Negative for dizziness and headaches.       Objective:   Physical Exam Constitutional:  General: She is not in acute distress.    Appearance: She is well-developed.  Cardiovascular:     Rate and Rhythm: Normal rate and regular rhythm.     Heart sounds: No murmur.  Pulmonary:     Effort: Pulmonary effort is normal.     Breath sounds: Normal breath sounds. No wheezing.  Abdominal:     General: There is no distension.     Palpations: Abdomen is soft. There is no mass.     Tenderness: There is  abdominal tenderness (epigastric ). There is no guarding or rebound.  Musculoskeletal: Normal range of motion.        General: No tenderness.     Comments: No CVA tenderness  Skin:    General: Skin is warm and dry.     Findings: No rash.  Neurological:     Mental Status: She is alert and oriented to person, place, and time.         Assessment & Plan:   CLL (chronic lymphocytic leukemia) ([place]) Continue follow up Dr. Irene Limbo  Gastroesophageal reflux disease, esophagitis presence not specified -     ondansetron (ZOFRAN) 4 MG tablet; Once daily for diarrhea -     omeprazole (PRILOSEC) 40 MG capsule; Take 1 capsule (40 mg total) by mouth daily. Avoid NSAIDS Follow up with GI for EGD if not better  Irritable bowel syndrome with diarrhea -     ondansetron (ZOFRAN) 4 MG tablet; Once daily for diarrhea Follow up with GI if not better  Other orders -     triamcinolone cream (KENALOG) 0.5 %; Apply 1 application topically 2 (two) times daily.   Future Appointments  Date Time Provider [place]  07/24/2019  3:00 PM Dohmeier, Asencion Partridge, MD [facility] None  09/12/2019  1:45 PM [facility] None  09/12/2019  2:20 PM Brunetta Genera, MD [facility] None  09/30/2019  3:00 PM [name], MD [facility] None

## 2019-07-11 ENCOUNTER — Telehealth: Payer: Self-pay | Admitting: Physician Assistant

## 2019-07-11 NOTE — Telephone Encounter (Signed)
Left voice message to inform the patient of peppermint oil use.

## 2019-07-11 NOTE — Telephone Encounter (Signed)
For IBS and peppermint oil.  Stop if it worsens heart burn or causes flushing.  Ideally enteric coated peppermint oil capsules 2 a day is best but if you got the oil, you can use 0.40ml or 180 mg of pepperment oil up to 3 x a day.

## 2019-07-16 NOTE — Telephone Encounter (Signed)
Letter printed & mailed out to address on file

## 2019-07-16 NOTE — Telephone Encounter (Signed)
Patient still has not returned phone call on peppermint oil, will try to inform by mail/letter.

## 2019-07-24 ENCOUNTER — Institutional Professional Consult (permissible substitution): Payer: Self-pay | Admitting: Neurology

## 2019-08-15 ENCOUNTER — Institutional Professional Consult (permissible substitution): Payer: Self-pay | Admitting: Neurology

## 2019-08-28 ENCOUNTER — Institutional Professional Consult (permissible substitution): Payer: Self-pay | Admitting: Neurology

## 2019-08-28 ENCOUNTER — Telehealth: Payer: Self-pay | Admitting: Neurology

## 2019-08-28 NOTE — Telephone Encounter (Signed)
Pt called and cancelled apt for today. Marked as a no show.

## 2019-08-29 ENCOUNTER — Encounter: Payer: Self-pay | Admitting: Physician Assistant

## 2019-09-03 ENCOUNTER — Ambulatory Visit: Payer: 59 | Admitting: Physician Assistant

## 2019-09-03 ENCOUNTER — Encounter: Payer: Self-pay | Admitting: Physician Assistant

## 2019-09-03 ENCOUNTER — Other Ambulatory Visit: Payer: Self-pay

## 2019-09-03 VITALS — BP 136/74 | HR 68 | Temp 97.5°F | Wt 211.0 lb

## 2019-09-03 DIAGNOSIS — M5442 Lumbago with sciatica, left side: Secondary | ICD-10-CM | POA: Diagnosis not present

## 2019-09-03 DIAGNOSIS — Z9889 Other specified postprocedural states: Secondary | ICD-10-CM

## 2019-09-03 DIAGNOSIS — M7062 Trochanteric bursitis, left hip: Secondary | ICD-10-CM

## 2019-09-03 DIAGNOSIS — M7061 Trochanteric bursitis, right hip: Secondary | ICD-10-CM

## 2019-09-03 MED ORDER — DEXAMETHASONE SODIUM PHOSPHATE 100 MG/10ML IJ SOLN: 20.0000 mg | Freq: Once | INTRAMUSCULAR | Status: DC

## 2019-09-03 NOTE — Patient Instructions (Signed)
Hip Bursitis  Hip bursitis is inflammation of a fluid-filled sac (bursa) in the hip joint. The bursa prevents the bones in the hip joint from rubbing against each other. Hip bursitis can cause mild to moderate pain, and symptoms often come and go over time. What are the causes? This condition may be caused by:  Injury to the hip.  Overuse of the muscles that surround the hip joint.  Previous injury or surgery of the hip.  Arthritis or gout.  Diabetes.  Thyroid disease.  Infection. In some cases, the cause may not be known. What are the signs or symptoms? Symptoms of this condition include:  Mild or moderate pain in the hip area. Pain may get worse with movement.  Tenderness and swelling of the hip, especially on the outer side of the hip.  In rare cases, the bursa may become infected. This may cause a fever, as well as warmth and redness in the area. Symptoms may come and go. How is this diagnosed? This condition may be diagnosed based on:  A physical exam.  Your medical history.  X-rays.  Removal of fluid from your inflamed bursa for testing (biopsy). You may be sent to a health care provider who specializes in bone diseases (orthopedist) or a provider who specializes in joint inflammation (rheumatologist). How is this treated? This condition is treated by resting, icing, applying pressure (compression), and raising (elevating) the injured area. This is called RICE treatment. In some cases, this may be enough to make your symptoms go away. Treatment may also include:  Using crutches.  Draining fluid out of the bursa to help relieve swelling.  Injecting medicine that helps to reduce inflammation (cortisone).  Additional medicines if the bursa is infected. Follow these instructions at home: Managing pain, stiffness, and swelling   If directed, put ice on the painful area. ? Put ice in a plastic bag. ? Place a towel between your skin and the bag. ? Leave the ice  on for 20 minutes, 2-3 times a day. ? Raise (elevate) your hip above the level of your heart as much as you can without pain. To do this, try putting a pillow under your hips while you lie down. Activity  Return to your normal activities as told by your health care provider. Ask your health care provider what activities are safe for you.  Rest and protect your hip as much as possible until your pain and swelling get better. General instructions  Take over-the-counter and prescription medicines only as told by your health care provider.  Wear compression wraps only as told by your health care provider.  Do not use your hip to support your body weight until your health care provider says that you can. Use crutches as told by your health care provider.  Gently massage and stretch your injured area as often as is comfortable.  Keep all follow-up visits as told by your health care provider. This is important. How is this prevented?  Exercise regularly, as told by your health care provider.  Warm up and stretch before being active.  Cool down and stretch after being active.  If an activity irritates your hip or causes pain, avoid the activity as much as possible.  Avoid sitting down for long periods at a time. Contact a health care provider if you:  Have a fever.  Develop new symptoms.  Have difficulty walking or doing everyday activities.  Have pain that gets worse or does not get better with medicine.  Develop red skin or a feeling of warmth in your hip area. Get help right away if you:  Cannot move your hip.  Have severe pain. Summary  Hip bursitis is inflammation of a fluid-filled sac (bursa) in the hip joint.  Hip bursitis can cause mild to moderate pain, and symptoms often come and go over time.  This condition is treated with rest, ice, compression, elevation, and medicines. This information is not intended to replace advice given to you by your health care  provider. Make sure you discuss any questions you have with your health care provider. Document Released: 05/13/2002 Document Revised: 07/30/2018 Document Reviewed: 07/30/2018 Elsevier Patient Education  2020 [employer]  

## 2019-09-03 NOTE — Progress Notes (Signed)
SUBJECTIVE:  Rebecca Bond is a 73 y.o. female who complains low back pain 2 week(s) ago.  S/p spinal surgery 2018 with Dr. Sherley Bounds -  L4-5 extraforaminal microdiscectomy on the left. She was suppose to get epidural Sept 8th but she had a sore throat due to allergies and it was rescheduled to Oct 8th.  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 She has bilateral back pain, worse left leg but has pain down both legs. Worse with sitting long periods, she has not been able to sleep.  The pain is positional with bending or lifting, with radiation down the legs.  Symptoms have been constant and worsening since that time.  Patient denies fever, hematuria, numbness, tingling and saddle anesthesia  No new incontinence, wears depends.   Heating pad on lateral left hip helps. On gabapentin 600mg  at night with minimal help.   [id number] LUMBAR SPINE - 2-3 VIEW  COMPARISON:  MRI 04/05/2017  FINDINGS: 3 lateral intraprocedural images show sequential stages of L4-5 localization and surgery. L1-2, L2-3, L3-4 degenerative disc narrowing with vacuum phenomenon. No acute osseous finding.    Current Outpatient Medications (Cardiovascular):  .  bisoprolol-hydrochlorothiazide (ZIAC) 10-6.25 MG tablet, Take 1 tablet Daily for BP .  cholestyramine light (PREVALITE) 4 g packet, Take 1 packet in liquid  2 x / day for cholesterol .  ezetimibe (ZETIA) 10 MG tablet, Take 1 tablet Daily for Cholesterol  Current Outpatient Medications (Respiratory):  .  fexofenadine (ALLEGRA) 180 MG tablet, Take 1 tablet (180 mg total) by mouth daily. Take PRN for allergies.    Current Outpatient Medications (Other):  [name] Kitchen  ALPRAZolam (XANAX) 1 MG tablet, TAKE 1/2 TO 1 TABLET BY MOUTH THREE TIMES A DAY AS NEEDED .  Ascorbic Acid (VITAMIN C) 1000 MG tablet, Take 1,500 mg by mouth daily.  .  Cholecalciferol (D 2000) 2000 units TABS, Take 10,000 Units by mouth daily.  [name] Kitchen   escitalopram (LEXAPRO) 10 MG tablet, TAKE ONE TABLET BY MOUTH DAILY FOR MOOD .  famotidine (PEPCID) 40 MG tablet, Take 1 tablet daily for Heartburn & Reflux .  gabapentin (NEURONTIN) 300 MG capsule, 300 mg. .  Olopatadine HCl 0.2 % SOLN, INT 1 GTT IN OU QD .  omeprazole (PRILOSEC) 40 MG capsule, Take 1 capsule (40 mg total) by mouth daily. .  ondansetron (ZOFRAN) 4 MG tablet, Once daily for diarrhea .  Probiotic Product (PROBIOTIC PO), Take 1 tablet daily by mouth. .  triamcinolone cream (KENALOG) 0.5 %, Apply 1 application topically 2 (two) times daily. .  fluorometholone (FML) 0.1 % ophthalmic suspension, SHAKE LQ AND INT 1 GTT IN OU BID .  RESTASIS 0.05 % ophthalmic emulsion, INT 1 GTT IN OU BID  Allergies Allergies  Allergen Reactions  . Hyzaar [Losartan Potassium-Hctz] Other (See Comments)    "messed up my sodium counts" swells up lips.  [name] [Pseudoephedrine Hcl] Palpitations  . Levaquin [Levofloxacin In D5w] Diarrhea and Nausea Only  . Acyclovir And Related   . Biaxin [Clarithromycin]     GI Upset  . Celexa [Citalopram]   . Flexeril [Cyclobenzaprine]     "zombie-like" feeling  . Fosamax [Alendronate Sodium]     GI upset  . Iohexol Hives     Code: HIVES, Desc: pt states she broke out in hive 20 yrs ago from IV contrast.     . Losartan     Angioedema  . Meloxicam  GI upset  . Norvasc [Amlodipine] Swelling  . Pseudoephedrine     Palpitations  . Zoloft [Sertraline Hcl] Other (See Comments)    Has no emotions at all     SURGICAL HISTORY She  has a past surgical history that includes Cholecystectomy (1985); Tonsillectomy and adenoidectomy (1950's); Breast surgery; Lymph node biopsy; Temporomandibular joint arthroplasty (1980's); Functional endoscopic sinus surgery (1990's); Abdominal hysterectomy (1980's); laparoscopic appendectomy (N/A, 06/04/2014); immunoglobulin treatment (2017); Appendectomy; Shoulder arthroscopy (Right); Cataract extraction, bilateral; and Lumbar  laminectomy/decompression microdiscectomy (Left, 11/01/2017). FAMILY HISTORY Her family history includes Heart attack in her maternal grandmother; Hypertension in her maternal grandmother and mother; Parkinson's disease in her mother. SOCIAL HISTORY She  reports that she quit smoking about 50 years ago. Her smoking use included cigarettes. She has a 3.00 pack-year smoking history. She has never used smokeless tobacco. She reports current alcohol use of about 7.0 standard drinks of alcohol per week. She reports that she does not use drugs.   OBJECTIVE: Blood pressure 136/74, pulse 68, temperature (!) 97.5 F (36.4 C), weight 211 lb (95.7 kg), SpO2 97 %.  Patient is not able to ambulate well. Gait is  Antalgic. Straight leg raising with dorsiflexion positive bilaterally for radicular symptoms. Sensory exam in the legs are normal. Knee reflexes are normal Ankle reflexes are normal Strength is normal and symmetric in arms and legs. There is SI tenderness to palpation.  There is paraspinal muscle spasm.  There is midline tenderness.  ROM of spine with  limited in all spheres due to pain. Bilateral hip: positives: tenderness over greater trochanter. Good distal sensations and pulses bilaterally.  ASSESSMENT AND PLAN::  Lower back pain- positive straight leg  RICE, and exercise given  Call or return to clinic prn if these symptoms worsen or fail to improve as anticipated. Will do work note for this week Monday- Thursday, has ortho appointment Thursday for epidural.   Bilateral Hip Bursitis-  Injection- area cleaned with alcohol, Dexamethasone 10mg  and 1 CC lidocaine injected into greater trochanteric, tolerated well Heat or ice, and exercises given

## 2019-09-04 ENCOUNTER — Ambulatory Visit: Payer: 59

## 2019-09-05 ENCOUNTER — Encounter: Payer: Self-pay | Admitting: Physician Assistant

## 2019-09-09 ENCOUNTER — Telehealth: Payer: Self-pay

## 2019-09-09 ENCOUNTER — Other Ambulatory Visit: Payer: Self-pay | Admitting: Physician Assistant

## 2019-09-09 DIAGNOSIS — C9110 Chronic lymphocytic leukemia of B-cell type not having achieved remission: Secondary | ICD-10-CM

## 2019-09-09 NOTE — Telephone Encounter (Signed)
-----   Message from Vicie Mutters, [place] sent at 09/09/2019  9:24 AM EDT ----- Regarding: RE: work/ short term disabilty Contact: [phone] Did she go see her back doctor?  She said they were going to write her out of work after her injection. If it is for her back, she needs to talk with her back doctor that did the injection.  Estill Bamberg ----- Message ----- From: Elenor Quinones, CMA Sent: 09/09/2019   9:15 AM EDT To: Vicie Mutters, PA-C Subject: work/ short term disabilty                     Office Note:  States she was not able to GO to work today.  She would like YOU to call her back to discuss [place] please & thank you.

## 2019-09-09 NOTE — Telephone Encounter (Signed)
PATIENT will be going to talk to her Back Dr about poss short term disability.

## 2019-09-09 NOTE — Progress Notes (Signed)
Message sent to front office for scheduling

## 2019-09-10 ENCOUNTER — Other Ambulatory Visit: Payer: Self-pay | Admitting: Physical Medicine and Rehabilitation

## 2019-09-10 DIAGNOSIS — M48061 Spinal stenosis, lumbar region without neurogenic claudication: Secondary | ICD-10-CM

## 2019-09-11 ENCOUNTER — Other Ambulatory Visit: Payer: Self-pay

## 2019-09-11 ENCOUNTER — Ambulatory Visit (INDEPENDENT_AMBULATORY_CARE_PROVIDER_SITE_OTHER): Payer: 59 | Admitting: *Deleted

## 2019-09-11 ENCOUNTER — Encounter: Payer: Self-pay | Admitting: Physician Assistant

## 2019-09-11 VITALS — BP 132/80 | HR 60 | Temp 97.9°F | Resp 16 | Wt 213.0 lb

## 2019-09-11 DIAGNOSIS — Z23 Encounter for immunization: Secondary | ICD-10-CM | POA: Diagnosis not present

## 2019-09-11 DIAGNOSIS — C9110 Chronic lymphocytic leukemia of B-cell type not having achieved remission: Secondary | ICD-10-CM

## 2019-09-11 NOTE — Progress Notes (Signed)
Patient is here for a NV to check labs for the [place] and Dr Irene Limbo. She also received a fluvax today.

## 2019-09-12 ENCOUNTER — Ambulatory Visit: Payer: 59 | Admitting: Hematology

## 2019-09-12 ENCOUNTER — Other Ambulatory Visit: Payer: 59

## 2019-09-12 ENCOUNTER — Telehealth: Payer: Self-pay | Admitting: Physician Assistant

## 2019-09-12 ENCOUNTER — Encounter: Payer: Self-pay | Admitting: Physician Assistant

## 2019-09-12 LAB — CBC WITH DIFFERENTIAL/PLATELET
Absolute Monocytes: 641 cells/uL (ref 200–950)
Basophils Absolute: 0 cells/uL (ref 0–200)
Basophils Relative: 0 %
Eosinophils Absolute: 320 cells/uL (ref 15–500)
Eosinophils Relative: 0.3 %
HCT: 37 % (ref 35.0–45.0)
Hemoglobin: 11.8 g/dL (ref 11.7–15.5)
Lymphs Abs: 99965 cells/uL — ABNORMAL HIGH (ref 850–3900)
MCH: 31.5 pg (ref 27.0–33.0)
MCHC: 31.9 g/dL — ABNORMAL LOW (ref 32.0–36.0)
MCV: 98.7 fL (ref 80.0–100.0)
MPV: 9.1 fL (ref 7.5–12.5)
Monocytes Relative: 0.6 %
Neutro Abs: 5874 cells/uL (ref 1500–7800)
Neutrophils Relative %: 5.5 %
Platelets: 91 10*3/uL — ABNORMAL LOW (ref 140–400)
RBC: 3.75 10*6/uL — ABNORMAL LOW (ref 3.80–5.10)
RDW: 13.1 % (ref 11.0–15.0)
Total Lymphocyte: 93.6 %
WBC: 106.8 10*3/uL — ABNORMAL HIGH (ref 3.8–10.8)

## 2019-09-12 LAB — COMPLETE METABOLIC PANEL WITH GFR
AG Ratio: 2.9 (calc) — ABNORMAL HIGH (ref 1.0–2.5)
ALT: 24 U/L (ref 6–29)
AST: 20 U/L (ref 10–35)
Albumin: 4.3 g/dL (ref 3.6–5.1)
Alkaline phosphatase (APISO): 58 U/L (ref 37–153)
BUN: 21 mg/dL (ref 7–25)
CO2: 27 mmol/L (ref 20–32)
Calcium: 9.6 mg/dL (ref 8.6–10.4)
Chloride: 97 mmol/L — ABNORMAL LOW (ref 98–110)
Creat: 0.84 mg/dL (ref 0.60–0.93)
GFR, Est African American: 80 mL/min/{1.73_m2} (ref >=60)
GFR, Est Non African American: 69 mL/min/{1.73_m2} (ref >=60)
Globulin: 1.5 g/dL (calc) — ABNORMAL LOW (ref 1.9–3.7)
Glucose, Bld: 100 mg/dL — ABNORMAL HIGH (ref 65–99)
Potassium: 4 mmol/L (ref 3.5–5.3)
Sodium: 133 mmol/L — ABNORMAL LOW (ref 135–146)
Total Bilirubin: 0.7 mg/dL (ref 0.2–1.2)
Total Protein: 5.8 g/dL — ABNORMAL LOW (ref 6.1–8.1)

## 2019-09-12 LAB — LACTATE DEHYDROGENASE: LDH: 155 U/L (ref 120–250)

## 2019-09-12 NOTE — Telephone Encounter (Signed)
-----   Message from Vicie Mutters, [place] sent at 09/11/2019  4:47 PM EDT ----- Regarding: Referral for back Ortho referred patient to Dr. Sherley Bounds for her back pain, she has seen him in the past and prefers to see a different physician. Do you have any suggestions? Estill Bamberg

## 2019-09-12 NOTE — Telephone Encounter (Signed)
patient called to request referral to a different neurosurgeon than hx provider, Dr Sherley Bounds. Per Vicie Mutters, agrees to send referral. Patient will call back with in network provider preference.

## 2019-09-13 NOTE — Progress Notes (Signed)
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  INTERVAL HISTORY:  Rebecca Bond returns today for management and evaluation of [name]. The patient's last visit with [place] was on 06/12/2019. The pt reports that she is doing well overall.  The pt reports that she has begun taking 600 mg of Gabapentin due to her back huritng for several weeks. She has Bursitis in both hip joints and was given a cortisone injection. She is scheduled to see another physician who will evaluate her back, he works in the office off of [employer] but first she will be getting an MRI on 10/24. Pt feels that her back pain is due to her sitting for many hours at her job so she has decided to retire at the end of the year. Pt is also having problems sleeping and notes that she experiences diarrhea when she is stressed. Her back problems are her biggest stressor currently. Pt is interested in taking Zinc to help protect her from [place]. She states that she avoids crowds, wears a mask when in the company of others and takes precautions to  sterilize her hands often.   Lab results (09/11/19) of CBC w/diff and CMP is as follows: all values are WNL except for WBC at 106.8K, RBC at 3.75, MCHC at 31.9, PLTs at 91K, Lymphs Abs at 99,965K, Glucose at 100, Sodium at 133, Chloride at 97, Total Protein at 5.8, Globulin at 1.5, AG Ratio at 2.9.  09/11/2019 LDH at 155  On review of systems, pt reports back pain, sleeplessness, diarrhea and denies new lumps/bumps, abdominal pain and any other symptoms.    REVIEW OF SYSTEMS:    A 10+ POINT REVIEW OF SYSTEMS WAS OBTAINED including neurology, dermatology, psychiatry, cardiac, respiratory, lymph, extremities, GI, GU, Musculoskeletal, constitutional, breasts, reproductive, HEENT.  All pertinent positives are noted in the HPI.  All others are negative.  . Past Medical History:  Diagnosis Date  . Anemia   . Anxiety   . Arthritis   . CLL (chronic lymphocytic leukemia) ([place]) dx'd ~ 2015  . Depression   . GERD (gastroesophageal reflux disease)   . Headache    "sinus headaches"  . History of kidney stones   . Hypertension   . OSA (obstructive sleep apnea)   . Pneumonia   . Prediabetes   . Recurrent sinus infections 08/02/2012    . Past Surgical History:  Procedure Laterality Date  . ABDOMINAL HYSTERECTOMY  1980's   "endometrosis"  . APPENDECTOMY    . BREAST SURGERY    . CATARACT EXTRACTION, BILATERAL    . CHOLECYSTECTOMY  1985  . FUNCTIONAL ENDOSCOPIC SINUS SURGERY  1990's   "cause I kept  having sinus infections"  . immunoglobulin treatment  2017  . LAPAROSCOPIC APPENDECTOMY N/A 06/04/2014   Procedure: APPENDECTOMY LAPAROSCOPIC;  Surgeon: Zenovia Jarred, MD;  Location: [facility];  Service: General;  Laterality: N/A;  . LUMBAR LAMINECTOMY/DECOMPRESSION MICRODISCECTOMY Left 11/01/2017   Procedure: Left Lumbar Four-Five Extraforaminal Microdiscectomy;  Surgeon: Eustace Moore, MD;  Location: [facility];  Service: Neurosurgery;  Laterality: Left;  . LYMPH NODE BIOPSY     "determined I had  CLL"  . SHOULDER ARTHROSCOPY Right   . TEMPOROMANDIBULAR JOINT ARTHROPLASTY  1980's  . TONSILLECTOMY AND ADENOIDECTOMY  1950's    . Social History   Tobacco Use  . Smoking status: Former Smoker    Packs/day: 0.75    Years: 4.00    Pack years: 3.00    Types: Cigarettes    Quit date: 12/05/1968    Years since quitting: 50.8  . Smokeless tobacco: Never Used  Substance Use Topics  . Alcohol use: Yes    Alcohol/week: 7.0 standard drinks    Types: 7 Glasses of wine per week  . Drug use: No    ALLERGIES:  is allergic to hyzaar [losartan potassium-hctz]; sudafed [pseudoephedrine hcl]; levaquin [levofloxacin in d5w]; acyclovir and related; biaxin [clarithromycin]; celexa [citalopram]; flexeril [cyclobenzaprine]; fosamax [alendronate sodium]; iohexol; losartan; meloxicam; norvasc [amlodipine]; pseudoephedrine; and zoloft [sertraline hcl].  MEDICATIONS:  Current Outpatient Medications  Medication Sig Dispense Refill  . ALPRAZolam (XANAX) 1 MG tablet TAKE 1/2 TO 1 TABLET BY MOUTH THREE TIMES A DAY AS NEEDED 90 tablet 0  . Ascorbic Acid (VITAMIN C) 1000 MG tablet Take 1,500 mg by mouth daily.     . bisoprolol-hydrochlorothiazide (ZIAC) 10-6.25 MG tablet Take 1 tablet Daily for BP 90 tablet 3  . Cholecalciferol (D 2000) 2000 units TABS Take 10,000 Units by mouth daily.     . cholestyramine light (PREVALITE) 4 g packet Take 1 packet in liquid  2 x / day for cholesterol 180 each 3  . escitalopram (LEXAPRO) 10 MG tablet TAKE ONE TABLET BY MOUTH DAILY FOR MOOD 30 tablet 5  . ezetimibe (ZETIA) 10 MG tablet Take 1 tablet Daily for Cholesterol 90 tablet 3  . famotidine (PEPCID) 40 MG tablet Take 1 tablet daily for Heartburn & Reflux 90 tablet 3  . fexofenadine (ALLEGRA) 180 MG tablet Take 1 tablet (180 mg total) by mouth daily. Take PRN for allergies. 30 tablet 6  . fluorometholone (FML) 0.1 % ophthalmic suspension SHAKE LQ AND INT 1 GTT IN OU BID    . gabapentin (NEURONTIN) 300 MG capsule 300 mg.     . Olopatadine HCl 0.2 % SOLN INT 1 GTT IN OU QD    . omeprazole (PRILOSEC) 40 MG capsule Take 1 capsule (40 mg total) by mouth daily. 90 capsule 1  . ondansetron (ZOFRAN) 4 MG tablet Once daily for diarrhea 30 tablet 1  . Probiotic Product (PROBIOTIC PO) Take 1 tablet daily by mouth.    . RESTASIS 0.05 % ophthalmic emulsion INT 1 GTT IN OU BID    . triamcinolone cream (KENALOG) 0.5 % Apply 1 application topically 2 (two) times daily. 80 g 2   Current Facility-Administered Medications  Medication Dose Route Frequency Provider Last Rate Last Dose  . dexamethasone (DECADRON) injection 20 mg  20 mg Intramuscular Once Vicie Mutters, PA-C        PHYSICAL EXAMINATION: ECOG PERFORMANCE STATUS: 1 - Symptomatic but completely ambulatory  Vitals:   09/18/19 1456  BP: (!) 157/62  Pulse: 71  Resp:  18  Temp: 98.3 F (36.8 C)  SpO2: 95%    Filed Weights   09/18/19 1456  Weight: 214 lb 6.4 oz (97.3 kg)   .Body mass index is 39.21 kg/m.   GENERAL:alert, in no acute distress and comfortable SKIN: no acute rashes, no significant lesions EYES: conjunctiva are pink and non-injected, sclera anicteric OROPHARYNX: MMM, no exudates, no oropharyngeal erythema or ulceration NECK: supple, no JVD LYMPH:  no palpable lymphadenopathy in the cervical, axillary or inguinal regions  LUNGS: clear to auscultation b/l with normal respiratory effort HEART: regular rate & rhythm ABDOMEN:  normoactive bowel sounds , non tender, not distended. No palpable hepatosplenomegaly.  Extremity: no pedal edema PSYCH: alert & oriented x 3 with fluent speech NEURO: no focal motor/sensory deficits  LABORATORY DATA:   .[name] Kitchen CBC Latest Ref Rng & Units 09/11/2019 06/12/2019 05/29/2019  WBC 3.8 - 10.8 Thousand/uL 106.8(H) 87.6(HH) 125.1(H)  Hemoglobin 11.7 - 15.5 g/dL 11.8 10.8(L) 10.4(L)  Hematocrit 35.0 - 45.0 % 37.0 35.0(L) 31.4(L)  Platelets 140 - 400 Thousand/uL 91(L) 68(L) 124(L)   . CBC    Component Value  Date/Time   WBC 106.8 (H) 09/11/2019 1635   RBC 3.75 (L) 09/11/2019 1635   HGB 11.8 09/11/2019 1635   HGB 11.4 (L) 12/13/2018 1440   HGB 11.2 (L) 11/08/2017 1418   HCT 37.0 09/11/2019 1635   HCT 34.8 11/08/2017 1418   PLT 91 (L) 09/11/2019 1635   PLT 78 (L) 12/13/2018 1440   PLT 140 (L) 11/08/2017 1418   MCV 98.7 09/11/2019 1635   MCV 96.4 11/08/2017 1418   MCH 31.5 09/11/2019 1635   MCHC 31.9 (L) 09/11/2019 1635   RDW 13.1 09/11/2019 1635   RDW 14.8 (H) 11/08/2017 1418   LYMPHSABS 99,965 (H) 09/11/2019 1635   LYMPHSABS 87.0 (H) 11/08/2017 1418   MONOABS 1.2 (H) 06/12/2019 1402   MONOABS 1.5 (H) 11/08/2017 1418   EOSABS 320 09/11/2019 1635   EOSABS 0.6 (H) 11/08/2017 1418   BASOSABS 0 09/11/2019 1635   BASOSABS 0.2 (H) 11/08/2017 1418      CMP Latest Ref Rng & Units 09/11/2019 06/12/2019 05/29/2019  Glucose 65 - 99 mg/dL 100(H) 102(H) 107(H)  BUN 7 - 25 mg/dL 21 24(H) 25  Creatinine 0.60 - 0.93 mg/dL 0.84 0.83 0.76  Sodium 135 - 146 mmol/L 133(L) 134(L) 129(L)  Potassium 3.5 - 5.3 mmol/L 4.0 3.6 3.9  Chloride 98 - 110 mmol/L 97(L) 100 98  CO2 20 - 32 mmol/L 27 23 25   Calcium 8.6 - 10.4 mg/dL 9.6 9.0 9.4  Total Protein 6.1 - 8.1 g/dL 5.8(L) 6.3(L) 6.0(L)  Total Bilirubin 0.2 - 1.2 mg/dL 0.7 0.7 0.6  Alkaline Phos 38 - 126 U/L - 64 -  AST 10 - 35 U/L 20 29 19   ALT 6 - 29 U/L 24 27 19       RADIOGRAPHIC STUDIES: I have personally reviewed the radiological images as listed and agreed with the findings in the report.  CT ABDOMEN AND PELVIS WITHOUT CONTRAST   IMPRESSION: 1. No acute abnormalities within the abdomen or pelvis. 2. Mild splenomegaly and several prominent to mildly enlarged pelvic and inguinal lymph nodes. These findings are similar to the prior CT. 3. Small nonobstructing stone in the lower pole the right kidney. No ureteral stones or obstructive uropathy.   Electronically Signed   By: Lajean Manes M.D.   On: 11/14/2016 16:39    ASSESSMENT &  PLAN:   73 y.o.  caucasian female with  1.Rai Stage 2 previously - now Stage IV CLL with lymphocytosis, LNadenopathy and mild splenomegaly with anemia and thrombocytopenia. -patient has previously and continues to desire holding off CLL directed treatment as long as possible.   Lab Results  Component Value Date   LDH 155 09/11/2019   PLAN: -Discussed pt labwork, 09/11/19; Hgb and PLTs have improved, LDH is okay and blood chemistries are steady.  -Advised pt that sleeping well, de-stressing, staying active and eating well are good for the immune system  -Discussed pandemic safety precautions  -Will continue to watch platelets. -Also recent steroid injections in the last 2-3 weeks affecting blood counts -Will see back in 3 months with labs, sooner if any new concerns   2.  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 Has been taking good infection prevention precautions. CLL initially diagnosed 2009 with 13q deletion. Only intervention thus far has been intermittent IVIG, clinically very helpful with decrease in respiratory infections.  Last imaging was CT AP 02-2015. --showed no evidence of Splenomegaly. Negative Hep B serology 2015  No overt new constitutional symptoms except mild unchanged fatigue. Has had significant recurrent headaches with IVIG - discontinued  3. Mild Anemia Hgb hemoglobin stable in the 10.5-11 range.  4. Moderate thrombocytopenia PLT count at 68k and has recently been in the 80-90k range.  5. H/o  Recurrent cutaneous SCC s/p Mohs surgery for SCC of the nosehealed. No issues with infection-   her 13q deletion places her at risk for recurrent SCC. -continue close f/u with dermatologist for evaluation and management of non melanoma skin cancers that can be increased in patient with CLL with 13q deletion.   FOLLOW UP: RTC with Dr Irene Limbo with labs in 3 months (Patient will be doing labs with PCP per her preference)  The total time spent in the appt was 20 minutes and  more than 50% was on counseling and direct patient cares.  All of the patient's questions were answered with apparent satisfaction. The patient knows to call the clinic with any problems, questions or concerns.   Sullivan Lone MD [place] AAHIVMS [facility] Hematology/Oncology Physician [facility]  (Office):       [phone] (Work cell):  [phone] (Fax):           [phone]  I, Yevette Edwards, am acting as a scribe for Dr. Sullivan Lone.   .I have reviewed the above documentation for accuracy and completeness, and I agree with the above. Brunetta Genera MD

## 2019-09-17 ENCOUNTER — Telehealth: Payer: Self-pay | Admitting: Physician Assistant

## 2019-09-17 ENCOUNTER — Telehealth: Payer: Self-pay | Admitting: Hematology

## 2019-09-17 DIAGNOSIS — Z9889 Other specified postprocedural states: Secondary | ICD-10-CM

## 2019-09-17 DIAGNOSIS — M5442 Lumbago with sciatica, left side: Secondary | ICD-10-CM

## 2019-09-17 NOTE — Telephone Encounter (Signed)
-----   Message from Vicie Mutters, [place] sent at 09/11/2019  4:47 PM EDT ----- Regarding: Referral for back Ortho referred patient to Dr. Sherley Bounds for her back pain, she has seen him in the past and prefers to see a different physician. Do you have any suggestions? Rebecca Bond

## 2019-09-17 NOTE — Telephone Encounter (Signed)
-----   Message from Oliver Pila sent at 09/17/2019 10:56 AM EDT ----- Regarding: RE: Referral for back Patient has called and requested referral to Dr Melina Schools for back pain.  ----- Message ----- From: Vicie Mutters, PA-C Sent: 09/11/2019   4:47 PM EDT To: Oliver Pila Subject: Referral for back                              Ortho referred patient to Dr. Sherley Bounds for her back pain, she has seen him in the past and prefers to see a different physician. Do you have any suggestions? Estill Bamberg

## 2019-09-17 NOTE — Telephone Encounter (Signed)
patient called to request referral to Dr Melina Schools for back pain. Please see imaging hx and upcoming appointments.

## 2019-09-17 NOTE — Telephone Encounter (Signed)
Returned patient's phone call regarding cancelling an appointment, per patient's request 10/14 has been cancelled due to patient receiving lab work done elsewhere.

## 2019-09-18 ENCOUNTER — Other Ambulatory Visit: Payer: Self-pay

## 2019-09-18 ENCOUNTER — Inpatient Hospital Stay: Payer: 59 | Attending: Hematology | Admitting: Hematology

## 2019-09-18 ENCOUNTER — Inpatient Hospital Stay: Payer: 59

## 2019-09-18 VITALS — BP 157/62 | HR 71 | Temp 98.3°F | Resp 18 | Ht 62.0 in | Wt 214.4 lb

## 2019-09-18 DIAGNOSIS — Z90710 Acquired absence of both cervix and uterus: Secondary | ICD-10-CM | POA: Diagnosis not present

## 2019-09-18 DIAGNOSIS — I10 Essential (primary) hypertension: Secondary | ICD-10-CM | POA: Diagnosis not present

## 2019-09-18 DIAGNOSIS — Z79899 Other long term (current) drug therapy: Secondary | ICD-10-CM | POA: Diagnosis not present

## 2019-09-18 DIAGNOSIS — D696 Thrombocytopenia, unspecified: Secondary | ICD-10-CM | POA: Insufficient documentation

## 2019-09-18 DIAGNOSIS — F329 Major depressive disorder, single episode, unspecified: Secondary | ICD-10-CM | POA: Insufficient documentation

## 2019-09-18 DIAGNOSIS — D801 Nonfamilial hypogammaglobulinemia: Secondary | ICD-10-CM | POA: Insufficient documentation

## 2019-09-18 DIAGNOSIS — D649 Anemia, unspecified: Secondary | ICD-10-CM | POA: Diagnosis not present

## 2019-09-18 DIAGNOSIS — C9110 Chronic lymphocytic leukemia of B-cell type not having achieved remission: Secondary | ICD-10-CM

## 2019-09-18 DIAGNOSIS — Z87891 Personal history of nicotine dependence: Secondary | ICD-10-CM | POA: Insufficient documentation

## 2019-09-19 ENCOUNTER — Telehealth: Payer: Self-pay | Admitting: Hematology

## 2019-09-19 NOTE — Telephone Encounter (Signed)
Spoke with patient per 10/14 los.  Patient aware of her appt date and time.

## 2019-09-26 ENCOUNTER — Ambulatory Visit (INDEPENDENT_AMBULATORY_CARE_PROVIDER_SITE_OTHER): Payer: 59 | Admitting: Adult Health Nurse Practitioner

## 2019-09-26 ENCOUNTER — Other Ambulatory Visit: Payer: Self-pay

## 2019-09-26 ENCOUNTER — Encounter: Payer: Self-pay | Admitting: Adult Health Nurse Practitioner

## 2019-09-26 VITALS — BP 124/80 | HR 64 | Temp 97.2°F | Resp 16 | Ht 62.0 in | Wt 214.2 lb

## 2019-09-26 DIAGNOSIS — M5416 Radiculopathy, lumbar region: Secondary | ICD-10-CM | POA: Diagnosis not present

## 2019-09-26 DIAGNOSIS — H1013 Acute atopic conjunctivitis, bilateral: Secondary | ICD-10-CM

## 2019-09-26 DIAGNOSIS — L700 Acne vulgaris: Secondary | ICD-10-CM

## 2019-09-26 NOTE — Progress Notes (Signed)
Assessment and Plan:  Rebecca Bond was seen today for belepharitis and leg pain.  Diagnoses and all orders for this visit:  Closed follicle comedo Use warm moist compress TID Apply antibiotic ointment to area with cotton tipped swab. Monitor area  Lumbar radiculopathy Increase gabapentin 300mg  slowly increasing to three times a day. Discussed medication and side effects Unable to give trigger point injection to paitnet today, discussed this at length. Work note provided for remainder of week. MRI schedule in two days  Allergic Conjunctivitis Continue eye drops Was eyes in am with baby shampoo Discussed eye hygeine   Further disposition pending results of labs. Discussed med's effects and SE's.   Over 30 minutes of interview exam, counseling, chart review, and critical decision making was performed.   Future Appointments  Date Time Provider [place]  09/28/2019 12:30 PM GI-315 MR 1 GI-315MRI [facility] [facility]  10/15/2019  4:00 PM [name], MD [facility] None  12/26/2019  3:00 PM Brunetta Genera, MD [facility] None    ------------------------------------------------------------------------------------------------------------------   HPI 73 y.o.female presents for evaluation of her right eye.  She reports she has bad allergies.  She has a small red spot on the corner of her eyes.  The area is red irritated and tender.  She reports she has dry eyes and allergies, she wakes up every day with crusted eyes and she has to use a wash cloth to cleanse them.   She takes restasiis twice a day and an allergy eye drop once a day.  She follows with Dr Ernestine Conrad and has follow up appointment with him next week.   She went to the dermatologist and has three areas on her right cheek and above her left eye removed for biopsy.  These areas are being monitored.  She had back surgery in 2018.  After this she has had chronic pain in the back specifically shooting down her leg,  bilaterally.  Right worse than the left.  She is having extreme difficulty  working.  She is required to sit all day and she is on the phone and computer.  She is not able to stand during her shift at work.  She reports she is able to decrease the pain with movement.  She does follow with Dr Jacelyn Grip for pain management.  She has received spinal injections and this has not been working for her pain.  She has been instructed to take gabapentin 300mg  at night.  She reports this is not helping her pain.  She contacted their office and was instructed to take 600mg  at night.  She reports this is making her extremely groggy and spaced the next day and she is still have significant pain during the day. She has MRI scheduled for 10/24.  She also has an appointment with Dr Rolena Infante at [facility] once records are received. She has requested records for Dr Ronnald Ramp to be sent.  She reports that her left hip has had in crease in the shooting pain that is 10/10 and it has been difficult to calm down the pain  She has had trigger point injections in the past with positive results her last one was 09/03/19.  She is requested another injection today.   Past Medical History:  Diagnosis Date  . Anemia   . Anxiety   . Arthritis   . 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 Depression   . GERD (gastroesophageal reflux disease)   . Headache    "sinus headaches"  .  History of kidney stones   . Hypertension   . OSA (obstructive sleep apnea)   . Pneumonia   . Prediabetes   . Recurrent sinus infections 08/02/2012    Allergies  Allergen Reactions  . Hyzaar [Losartan Potassium-Hctz] Other (See Comments)    "messed up my sodium counts" swells up lips.  [name] [Pseudoephedrine Hcl] Palpitations  . Levaquin [Levofloxacin In D5w] Diarrhea and Nausea Only  . Acyclovir And Related   . Biaxin [Clarithromycin]     GI Upset  . Celexa [Citalopram]   . Flexeril [Cyclobenzaprine]     "zombie-like" feeling  .  Fosamax [Alendronate Sodium]     GI upset  . Iohexol Hives     Code: HIVES, Desc: pt states she broke out in hive 20 yrs ago from IV contrast.     . Losartan     Angioedema  . Meloxicam     GI upset  . Norvasc [Amlodipine] Swelling  . Pseudoephedrine     Palpitations  . Zoloft [Sertraline Hcl] Other (See Comments)    Has no emotions at all     Current Outpatient Medications on File Prior to Visit  Medication Sig  . ALPRAZolam (XANAX) 1 MG tablet TAKE 1/2 TO 1 TABLET BY MOUTH THREE TIMES A DAY AS NEEDED  . Ascorbic Acid (VITAMIN C) 1000 MG tablet Take 1,500 mg by mouth daily.   . bisoprolol-hydrochlorothiazide (ZIAC) 10-6.25 MG tablet Take 1 tablet Daily for BP  . Cholecalciferol (D 2000) 2000 units TABS Take 10,000 Units by mouth daily.   . cholestyramine light (PREVALITE) 4 g packet Take 1 packet in liquid  2 x / day for cholesterol  . escitalopram (LEXAPRO) 10 MG tablet TAKE ONE TABLET BY MOUTH DAILY FOR MOOD  . ezetimibe (ZETIA) 10 MG tablet Take 1 tablet Daily for Cholesterol  . famotidine (PEPCID) 40 MG tablet Take 1 tablet daily for Heartburn & Reflux  . fexofenadine (ALLEGRA) 180 MG tablet Take 1 tablet (180 mg total) by mouth daily. Take PRN for allergies.  . fluorometholone (FML) 0.1 % ophthalmic suspension SHAKE LQ AND INT 1 GTT IN OU BID  . gabapentin (NEURONTIN) 300 MG capsule 300 mg.  . Olopatadine HCl 0.2 % SOLN INT 1 GTT IN OU QD  . omeprazole (PRILOSEC) 40 MG capsule Take 1 capsule (40 mg total) by mouth daily.  . ondansetron (ZOFRAN) 4 MG tablet Once daily for diarrhea  . Probiotic Product (PROBIOTIC PO) Take 1 tablet daily by mouth.  . RESTASIS 0.05 % ophthalmic emulsion INT 1 GTT IN OU BID  . triamcinolone cream (KENALOG) 0.5 % Apply 1 application topically 2 (two) times daily.   Current Facility-Administered Medications on File Prior to Visit  Medication  . dexamethasone (DECADRON) injection 20 mg    ROS: all negative except above.   Physical Exam:  BP  124/80   Pulse 64   Temp (!) 97.2 F (36.2 C)   Resp 16   Ht 5\' 2"  (1.575 m)   Wt 214 lb 3.2 oz (97.2 kg)   BMI 39.18 kg/m   General Appearance: Well nourished, in no apparent distress. Eyes: PERRLA, EOMs, conjunctiva no swelling or erythema.  Right eye redness 0.5cm past lateral canthus, central white head noted. Sinuses: No Frontal/maxillary tenderness ENT/Mouth: Ext aud canals clear, TMs without erythema, bulging. No erythema, swelling, or exudate on post pharynx.  Tonsils not swollen or erythematous. Hearing normal.  Neck: Supple, thyroid normal.  Respiratory: Respiratory effort normal, BS equal bilaterally  without rales, rhonchi, wheezing or stridor.  Cardio: RRR with no MRGs. Brisk peripheral pulses without edema.  Abdomen: Soft, + BS.  Non tender, no guarding, rebound, hernias, masses. Lymphatics: Non tender without lymphadenopathy.  Musculoskeletal: Full ROM, 5/5 strength, normal gait. Point tenderness to left IT band proximal and lateral gluteal.  Skin free of any erythema. Skin: Warm, dry without rashes, lesions, ecchymosis.  Neuro: Cranial nerves intact. Normal muscle tone, no cerebellar symptoms. Sensation intact.  Psych: Awake and oriented X 3, normal affect, Insight and Judgment appropriate.     Garnet Sierras, NP 2:15 PM [facility]

## 2019-09-26 NOTE — Patient Instructions (Addendum)
  Increase the amount of gabapentin you take.  You may take this three times a day.  You may try Diclofenac (Volatren) topical gel to the area.   Right eye, use warm moist heat to the eye three times a day. You may put a small amount of antibiotic ointment on this.  Continue using baby shampoo to cleanse your eyes.    Let us know if we can help to coordinate your care while getting specialist appointments.

## 2019-09-28 ENCOUNTER — Other Ambulatory Visit: Payer: Self-pay

## 2019-09-28 ENCOUNTER — Ambulatory Visit
Admission: RE | Admit: 2019-09-28 | Discharge: 2019-09-28 | Disposition: A | Discharge status: Home / Self Care | Payer: 59 | Source: Ambulatory Visit | Attending: Physical Medicine and Rehabilitation | Admitting: Physical Medicine and Rehabilitation

## 2019-09-28 DIAGNOSIS — M48061 Spinal stenosis, lumbar region without neurogenic claudication: Secondary | ICD-10-CM

## 2019-09-30 ENCOUNTER — Telehealth: Payer: Self-pay

## 2019-09-30 ENCOUNTER — Encounter: Payer: Self-pay | Admitting: Internal Medicine

## 2019-09-30 NOTE — Telephone Encounter (Signed)
Patient requesting a new work note to return tomorrow, she was unable to go to work today. Please advise.

## 2019-09-30 NOTE — Telephone Encounter (Signed)
Patient states that she is finding relief from taking the Gabapentin.

## 2019-10-10 ENCOUNTER — Other Ambulatory Visit: Payer: Self-pay | Admitting: Physician Assistant

## 2019-10-10 DIAGNOSIS — C9110 Chronic lymphocytic leukemia of B-cell type not having achieved remission: Secondary | ICD-10-CM

## 2019-10-10 NOTE — Telephone Encounter (Signed)
Patient had another episode with her back on Monday, she was out of work on Tuesday, Wednesday and today. Requesting a return to work note for 10/10/19.   Per Danton Sewer, ok to write out of work note for three days.

## 2019-10-11 ENCOUNTER — Encounter: Payer: Self-pay | Admitting: Physician Assistant

## 2019-10-15 ENCOUNTER — Other Ambulatory Visit: Payer: Self-pay

## 2019-10-15 ENCOUNTER — Ambulatory Visit (INDEPENDENT_AMBULATORY_CARE_PROVIDER_SITE_OTHER): Payer: 59 | Admitting: Internal Medicine

## 2019-10-15 ENCOUNTER — Other Ambulatory Visit: Payer: Self-pay | Admitting: Physician Assistant

## 2019-10-15 ENCOUNTER — Encounter: Payer: Self-pay | Admitting: Internal Medicine

## 2019-10-15 VITALS — BP 112/82 | HR 60 | Temp 97.2°F | Resp 16 | Ht 62.0 in | Wt 216.6 lb

## 2019-10-15 DIAGNOSIS — Z87891 Personal history of nicotine dependence: Secondary | ICD-10-CM

## 2019-10-15 DIAGNOSIS — Z0000 Encounter for general adult medical examination without abnormal findings: Secondary | ICD-10-CM

## 2019-10-15 DIAGNOSIS — C9110 Chronic lymphocytic leukemia of B-cell type not having achieved remission: Secondary | ICD-10-CM

## 2019-10-15 DIAGNOSIS — I10 Essential (primary) hypertension: Secondary | ICD-10-CM | POA: Diagnosis not present

## 2019-10-15 DIAGNOSIS — Z0001 Encounter for general adult medical examination with abnormal findings: Secondary | ICD-10-CM

## 2019-10-15 DIAGNOSIS — Z136 Encounter for screening for cardiovascular disorders: Secondary | ICD-10-CM

## 2019-10-15 DIAGNOSIS — Z8249 Family history of ischemic heart disease and other diseases of the circulatory system: Secondary | ICD-10-CM | POA: Diagnosis not present

## 2019-10-15 DIAGNOSIS — Z79899 Other long term (current) drug therapy: Secondary | ICD-10-CM

## 2019-10-15 DIAGNOSIS — E559 Vitamin D deficiency, unspecified: Secondary | ICD-10-CM

## 2019-10-15 DIAGNOSIS — E782 Mixed hyperlipidemia: Secondary | ICD-10-CM

## 2019-10-15 DIAGNOSIS — R7309 Other abnormal glucose: Secondary | ICD-10-CM

## 2019-10-15 NOTE — Patient Instructions (Signed)

## 2019-10-15 NOTE — Progress Notes (Signed)
Annual Screening/Preventative Visit & Comprehensive Evaluation &  Examination     This very nice 73 y.o. MWF presents for a Screening /Preventative Visit & comprehensive evaluation and management of multiple medical co-morbidities.  Patient has been followed for HTN, HLD, Prediabetes, CLL and Vitamin D Deficiency. Patient's Jerrye Bushy is controlled on her meds.     Patient has been followed by Dr Irene Limbo for indolent CLL  since 2013.      HTN predates circa 1998. Patient's BP has been controlled at home and patient denies any cardiac symptoms as chest pain, palpitations, shortness of breath, dizziness or ankle swelling. Today's BP is at goal -  112/82 .     Patient's is Statin Intolerant & her hyperlipidemia is not controlled with diet and Zetia / Cholestyramine. Patient denies myalgias or other medication SE's. Last lipids were not at goal:  Lab Results  Component Value Date   CHOL 231 (H) 10/15/2019   HDL 44 (L) 10/15/2019   LDLCALC 145 (H) 10/15/2019   TRIG 293 (H) 10/15/2019   CHOLHDL 5.3 (H) 10/15/2019      Patient has Gluttony and Morbid Obesity BMI 39+) and hx/o prediabetes (A1c 6.0% / 2012)  and patient denies reactive hypoglycemic symptoms, visual blurring, diabetic polys or paresthesias. Last A1c was Normal & at goal:  Lab Results  Component Value Date   HGBA1C 4.9 10/15/2019      Finally, patient has history of Vitamin D Deficiency("30" / 2008)   and last Vitamin D was near goal:  Lab Results  Component Value Date   VD25OH 69 10/15/2019   Current Outpatient Medications on File Prior to Visit  Medication Sig  . Ascorbic Acid (VITAMIN C) 1000 MG tablet Take 1,500 mg by mouth daily.   . bisoprolol-hydrochlorothiazide (ZIAC) 10-6.25 MG tablet Take 1 tablet Daily for BP  . cetirizine (ZYRTEC) 10 MG tablet Take 10 mg by mouth daily.  . Cholecalciferol (D 2000) 2000 units TABS Take 10,000 Units by mouth daily.   . cholestyramine light (PREVALITE) 4 g packet Take 1 packet in liquid   2 x / day for cholesterol  . escitalopram (LEXAPRO) 10 MG tablet TAKE ONE TABLET BY MOUTH DAILY FOR MOOD  . ezetimibe (ZETIA) 10 MG tablet Take 1 tablet Daily for Cholesterol  . famotidine (PEPCID) 40 MG tablet Take 1 tablet daily for Heartburn & Reflux  . fluorometholone (FML) 0.1 % ophthalmic suspension SHAKE LQ AND INT 1 GTT IN OU BID  . gabapentin (NEURONTIN) 300 MG capsule 300 mg 3 (three) times daily.   . Olopatadine HCl 0.2 % SOLN INT 1 GTT IN OU QD  . omeprazole (PRILOSEC) 40 MG capsule Take 1 capsule (40 mg total) by mouth daily.  . ondansetron (ZOFRAN) 4 MG tablet Once daily for diarrhea  . Probiotic Product (PROBIOTIC PO) Take 1 tablet daily by mouth.  . RESTASIS 0.05 % ophthalmic emulsion INT 1 GTT IN OU BID  . triamcinolone cream (KENALOG) 0.5 % Apply 1 application topically 2 (two) times daily.   No current facility-administered medications on file prior to visit.    Allergies  Allergen Reactions  . Hyzaar [Losartan Potassium-Hctz] Other (See Comments)    "messed up my sodium counts" swells up lips.  [name] [Pseudoephedrine Hcl] Palpitations  . Levaquin [Levofloxacin In D5w] Diarrhea and Nausea Only  . Acyclovir And Related   . Biaxin [Clarithromycin]     GI Upset  . Celexa [Citalopram]   . Flexeril [Cyclobenzaprine]     "zombie-like"  feeling  . Fosamax [Alendronate Sodium]     GI upset  . Iohexol Hives     Code: HIVES, Desc: pt states she broke out in hive 20 yrs ago from IV contrast.     . Losartan     Angioedema  . Meloxicam     GI upset  . Norvasc [Amlodipine] Swelling  . Pseudoephedrine     Palpitations  . Zoloft [Sertraline Hcl] Other (See Comments)    Has no emotions at all    Past Medical History:  Diagnosis Date  . Anemia   . Anxiety   . Arthritis   . CLL (chronic lymphocytic leukemia) ([place]) dx'd ~ 2015  . Depression   . GERD (gastroesophageal reflux disease)   . Headache    "sinus headaches"  . History of kidney stones   . Hypertension    . OSA (obstructive sleep apnea)   . Pneumonia   . Prediabetes   . Recurrent sinus infections 08/02/2012   Health Maintenance  Topic Date Due  . MAMMOGRAM  06/17/2019  . COLONOSCOPY  11/21/2026  . TETANUS/TDAP  08/22/2028  . INFLUENZA VACCINE  Completed  . DEXA SCAN  Completed  . Hepatitis C Screening  Completed  . PNA vac Low Risk Adult  Completed   Immunization History  Administered Date(s) Administered  . Influenza, High Dose Seasonal PF 08/29/2017, 08/22/2018, 09/11/2019  . Influenza,inj,Quad PF,6+ Mos 09/15/2014, 08/13/2015  . Influenza-Unspecified 08/31/2013  . Pneumococcal Conjugate-13 09/23/2015  . Pneumococcal Polysaccharide-23 08/29/2017  . Pneumococcal-Unspecified 12/28/2010  . Td 07/05/2006, 08/22/2018   Last Colon - 11/21/2016 - Dr Hilarie Fredrickson and recc 3 yr f/u Dec 2020  Last MGM - 06/16/2018 [facility] - Patient aware overdue and plans to schedule   Past Surgical History:  Procedure Laterality Date  . ABDOMINAL HYSTERECTOMY  1980's   "endometrosis"  . APPENDECTOMY    . BREAST SURGERY    . CATARACT EXTRACTION, BILATERAL    . CHOLECYSTECTOMY  1985  . FUNCTIONAL ENDOSCOPIC SINUS SURGERY  1990's   "cause I kept having sinus infections"  . immunoglobulin treatment  2017  . LAPAROSCOPIC APPENDECTOMY N/A 06/04/2014   Procedure: APPENDECTOMY LAPAROSCOPIC;  Surgeon: Zenovia Jarred, MD;  Location: [place];  Service: General;  Laterality: N/A;  . LUMBAR LAMINECTOMY/DECOMPRESSION MICRODISCECTOMY Left 11/01/2017   Procedure: Left Lumbar Four-Five Extraforaminal Microdiscectomy;  Surgeon: Eustace Moore, MD;  Location: [place];  Service: Neurosurgery;  Laterality: Left;  . LYMPH NODE BIOPSY     "determined I had CLL"  . SHOULDER ARTHROSCOPY Right   . TEMPOROMANDIBULAR JOINT ARTHROPLASTY  1980's  . TONSILLECTOMY AND ADENOIDECTOMY  1950's   Family History  Problem Relation Age of Onset  . Parkinson's disease Mother   . Hypertension Mother   . Hypertension Maternal Grandmother   .  Heart attack Maternal Grandmother        Mild   Social History   Tobacco Use  . Smoking status: Former Smoker    Packs/day: 0.75    Years: 4.00    Pack years: 3.00    Types: Cigarettes    Quit date: 12/05/1968    Years since quitting: 50.9  . Smokeless tobacco: Never Used  Substance Use Topics  . Alcohol use: Yes    Alcohol/week: 7.0 standard drinks    Types: 7 Glasses of wine per week  . Drug use: No    ROS Constitutional: Denies fever, chills, weight loss/gain, headaches, insomnia,  night sweats, and change in appetite. Does c/o fatigue.  Eyes: Denies redness, blurred vision, diplopia, discharge, itchy, watery eyes.  ENT: Denies discharge, congestion, post nasal drip, epistaxis, sore throat, earache, hearing loss, dental pain, Tinnitus, Vertigo, Sinus pain, snoring.  Cardio: Denies chest pain, palpitations, irregular heartbeat, syncope, dyspnea, diaphoresis, orthopnea, PND, claudication, edema Respiratory: denies cough, dyspnea, DOE, pleurisy, hoarseness, laryngitis, wheezing.  Gastrointestinal: Denies dysphagia, heartburn, reflux, water brash, pain, cramps, nausea, vomiting, bloating, diarrhea, constipation, hematemesis, melena, hematochezia, jaundice, hemorrhoids Genitourinary: Denies dysuria, frequency, urgency, nocturia, hesitancy, discharge, hematuria, flank pain Breast: Breast lumps, nipple discharge, bleeding.  Musculoskeletal: Denies arthralgia, myalgia, stiffness, Jt. Swelling, pain, limp, and strain/sprain. Denies falls. Skin: Denies puritis, rash, hives, warts, acne, eczema, changing in skin lesion Neuro: No weakness, tremor, incoordination, spasms, paresthesia, pain Psychiatric: Denies confusion, memory loss, sensory loss. Denies Depression. Endocrine: Denies change in weight, skin, hair change, nocturia, and paresthesia, diabetic polys, visual blurring, hyper / hypo glycemic episodes.  Heme/Lymph: No excessive bleeding, bruising, enlarged lymph nodes.  Physical Exam   BP 112/82   Pulse 60   Temp (!) 97.2 F (36.2 C)   Resp 16   Ht 5\' 2"  (1.575 m)   Wt 216 lb 9.6 oz (98.2 kg)   BMI 39.62 kg/m   General Appearance: Over nourished, well groomed and in no apparent distress.  Eyes: PERRLA, EOMs, conjunctiva no swelling or erythema, normal fundi and vessels. Sinuses: No frontal/maxillary tenderness ENT/Mouth: EACs patent / TMs  nl. Nares clear without erythema, swelling, mucoid exudates. Oral hygiene is good. No erythema, swelling, or exudate. Tongue normal, non-obstructing. Tonsils not swollen or erythematous. Hearing normal.  Neck: Supple, thyroid not palpable. No bruits, nodes or JVD. Respiratory: Respiratory effort normal.  BS equal and clear bilateral without rales, rhonci, wheezing or stridor. Cardio: Heart sounds are normal with regular rate and rhythm and no murmurs, rubs or gallops. Peripheral pulses are normal and equal bilaterally without edema. No aortic or femoral bruits. Chest: symmetric with normal excursions and percussion. Breasts: Symmetric, without lumps, nipple discharge, retractions, or fibrocystic changes.  Abdomen: Flat, soft with bowel sounds active. Nontender, no guarding, rebound, hernias, masses, or organomegaly.  Lymphatics: Non tender without lymphadenopathy.  Musculoskeletal: Full ROM all peripheral extremities, joint stability, 5/5 strength, and normal gait. Skin: Warm and dry without rashes, lesions, cyanosis, clubbing or  ecchymosis.  Neuro: Cranial nerves intact, reflexes equal bilaterally. Normal muscle tone, no cerebellar symptoms. Sensation intact.  Pysch: Alert and oriented X 3, normal affect, Insight and Judgment appropriate.   Assessment and Plan  1. Annual Preventative Screening Examination  1. Encounter for general adult medical examination with abnormal findings   2. Essential hypertension  - EKG 12-Lead - Urinalysis, Routine w reflex microscopic - Microalbumin / Creatinine Urine Ratio - CBC with Diff -  COMPLETE METABOLIC PANEL WITH GFR - Magnesium - TSH  3. Hyperlipidemia, mixed  - EKG 12-Lead - Lipid Profile - TSH  4. Abnormal glucose  - EKG 12-Lead - Hemoglobin A1c ([facility]) - Insulin, random  5. Vitamin D deficiency  - Vitamin D (25 hydroxy)  6. CLL (chronic lymphocytic leukemia) ([facility])  - CBC with Diff  7. Screening for ischemic heart disease  - EKG 12-Lead  8. FHx: heart disease  - EKG 12-Lead  9. Former smoker  - EKG 12-Lead  10. Medication management  - Urinalysis, Routine w reflex microscopic - Microalbumin / Creatinine Urine Ratio - CBC with Diff - COMPLETE METABOLIC PANEL WITH GFR - Magnesium - Lipid Profile - TSH - Hemoglobin A1c ([facility]) - Insulin, random -  Vitamin D (25 hydroxy)         Patient was counseled in prudent diet to achieve/maintain BMI less than 25 for weight control, BP monitoring, regular exercise and medications. Discussed med's effects and SE's. Screening labs and tests as requested with regular follow-up as recommended. Over 40 minutes of exam, counseling, chart review and high complex critical decision making was performed.   Kirtland Bouchard, MD

## 2019-10-16 ENCOUNTER — Other Ambulatory Visit: Payer: Self-pay | Admitting: Internal Medicine

## 2019-10-16 DIAGNOSIS — N3000 Acute cystitis without hematuria: Secondary | ICD-10-CM

## 2019-10-16 LAB — URINALYSIS, ROUTINE W REFLEX MICROSCOPIC
Bacteria, UA: NONE SEEN /HPF
Bilirubin Urine: NEGATIVE
Glucose, UA: NEGATIVE
Hgb urine dipstick: NEGATIVE
Hyaline Cast: NONE SEEN /LPF
Ketones, ur: NEGATIVE
Nitrite: NEGATIVE
Specific Gravity, Urine: 1.015 (ref 1.001–1.03)
pH: 5.5 (ref 5.0–8.0)

## 2019-10-16 LAB — COMPLETE METABOLIC PANEL WITH GFR
AG Ratio: 3.1 (calc) — ABNORMAL HIGH (ref 1.0–2.5)
ALT: 16 U/L (ref 6–29)
AST: 25 U/L (ref 10–35)
Albumin: 4.7 g/dL (ref 3.6–5.1)
Alkaline phosphatase (APISO): 64 U/L (ref 37–153)
BUN: 16 mg/dL (ref 7–25)
CO2: 24 mmol/L (ref 20–32)
Calcium: 9.4 mg/dL (ref 8.6–10.4)
Chloride: 96 mmol/L — ABNORMAL LOW (ref 98–110)
Creat: 0.73 mg/dL (ref 0.60–0.93)
GFR, Est African American: 95 mL/min/{1.73_m2} (ref >=60)
GFR, Est Non African American: 82 mL/min/{1.73_m2} (ref >=60)
Globulin: 1.5 g/dL (calc) — ABNORMAL LOW (ref 1.9–3.7)
Glucose, Bld: 94 mg/dL (ref 65–99)
Potassium: 3.9 mmol/L (ref 3.5–5.3)
Sodium: 133 mmol/L — ABNORMAL LOW (ref 135–146)
Total Bilirubin: 0.9 mg/dL (ref 0.2–1.2)
Total Protein: 6.2 g/dL (ref 6.1–8.1)

## 2019-10-16 LAB — CBC WITH DIFFERENTIAL/PLATELET
Absolute Monocytes: 1518 cells/uL — ABNORMAL HIGH (ref 200–950)
Basophils Absolute: 80 cells/uL (ref 0–200)
Basophils Relative: 0.1 %
Eosinophils Absolute: 240 cells/uL (ref 15–500)
Eosinophils Relative: 0.3 %
HCT: 35.8 % (ref 35.0–45.0)
Hemoglobin: 11.9 g/dL (ref 11.7–15.5)
Lymphs Abs: 73748 cells/uL — ABNORMAL HIGH (ref 850–3900)
MCH: 31.6 pg (ref 27.0–33.0)
MCHC: 33.2 g/dL (ref 32.0–36.0)
MCV: 95 fL (ref 80.0–100.0)
MPV: 9 fL (ref 7.5–12.5)
Monocytes Relative: 1.9 %
Neutro Abs: 4315 cells/uL (ref 1500–7800)
Neutrophils Relative %: 5.4 %
Platelets: 89 10*3/uL — ABNORMAL LOW (ref 140–400)
RBC: 3.77 10*6/uL — ABNORMAL LOW (ref 3.80–5.10)
RDW: 13.1 % (ref 11.0–15.0)
Total Lymphocyte: 92.3 %
WBC: 79.9 10*3/uL — ABNORMAL HIGH (ref 3.8–10.8)

## 2019-10-16 LAB — MAGNESIUM: Magnesium: 1.4 mg/dL — ABNORMAL LOW (ref 1.5–2.5)

## 2019-10-16 LAB — HEMOGLOBIN A1C
Hgb A1c MFr Bld: 4.9 % of total Hgb (ref <5.7)
Mean Plasma Glucose: 94 (calc)
eAG (mmol/L): 5.2 (calc)

## 2019-10-16 LAB — MICROALBUMIN / CREATININE URINE RATIO
Creatinine, Urine: 124 mg/dL (ref 20–275)
Microalb Creat Ratio: 54 mcg/mg creat — ABNORMAL HIGH (ref <30)
Microalb, Ur: 6.7 mg/dL

## 2019-10-16 LAB — LIPID PANEL
Cholesterol: 231 mg/dL — ABNORMAL HIGH (ref <200)
HDL: 44 mg/dL — ABNORMAL LOW (ref >=50)
LDL Cholesterol (Calc): 145 mg/dL (calc) — ABNORMAL HIGH
Non-HDL Cholesterol (Calc): 187 mg/dL (calc) — ABNORMAL HIGH (ref <130)
Total CHOL/HDL Ratio: 5.3 (calc) — ABNORMAL HIGH (ref <5.0)
Triglycerides: 293 mg/dL — ABNORMAL HIGH (ref <150)

## 2019-10-16 LAB — TSH: TSH: 8.7 mIU/L — ABNORMAL HIGH (ref 0.40–4.50)

## 2019-10-16 LAB — INSULIN, RANDOM: Insulin: 10.5 u[IU]/mL

## 2019-10-16 LAB — VITAMIN D 25 HYDROXY (VIT D DEFICIENCY, FRACTURES): Vit D, 25-Hydroxy: 69 ng/mL (ref 30–100)

## 2019-10-16 MED ORDER — NITROFURANTOIN MONOHYD MACRO 100 MG PO CAPS: ORAL_CAPSULE | ORAL | 0 refills | Status: DC

## 2019-10-17 ENCOUNTER — Encounter: Payer: Self-pay | Admitting: *Deleted

## 2019-10-18 ENCOUNTER — Telehealth: Payer: Self-pay

## 2019-10-18 ENCOUNTER — Telehealth: Payer: Self-pay | Admitting: *Deleted

## 2019-10-18 NOTE — Telephone Encounter (Signed)
Have attempted calling patient to ask about FMLA/Disability papers.  No answer so message was left on vm asking patient to call back for clarification.

## 2019-10-22 ENCOUNTER — Other Ambulatory Visit: Payer: Self-pay

## 2019-10-22 DIAGNOSIS — Z20822 Contact with and (suspected) exposure to covid-19: Secondary | ICD-10-CM

## 2019-10-23 LAB — NOVEL CORONAVIRUS, NAA: SARS-CoV-2, NAA: NOT DETECTED

## 2019-10-25 ENCOUNTER — Telehealth: Payer: Self-pay | Admitting: Internal Medicine

## 2019-10-25 NOTE — Telephone Encounter (Signed)
Per pt. Request, faxed COVID test result to [facility].  AttnNorm Salt @ [phone].

## 2019-10-25 NOTE — Telephone Encounter (Signed)
Patient is calling to receive her negative COVID result. Patient expressed understanding.

## 2019-10-28 ENCOUNTER — Telehealth: Payer: Self-pay

## 2019-10-28 NOTE — Telephone Encounter (Signed)
-----   Message from Vicie Mutters, [place] sent at 10/28/2019  1:51 PM EST ----- Regarding: RE: advice Contact: [phone] Make an office visit for tomorrow with anyone or needs to go to ER With her history needs to be seen.  Estill Bamberg ----- Message ----- From: Elenor Quinones, CMA Sent: 10/28/2019  12:23 PM EST To: Vicie Mutters, PA-C Subject: advice                                         Had COVID testing last week---results NEGATIVE.  She has CHIILLS, WEAKNESS, SOB & no FEVER.  Couldn't GO TO WORK today & does not FEEL like coming in office.  Please advise.[name] KitchenMarland KitchenMarland Kitchen

## 2019-10-28 NOTE — Telephone Encounter (Signed)
Message sent to front office in order to schedule office visit

## 2019-10-29 ENCOUNTER — Telehealth: Payer: Self-pay | Admitting: *Deleted

## 2019-10-29 NOTE — Telephone Encounter (Signed)
Patient called to ask if her FMLA ppwk had been received. She spoke with one of the nurses who completes FMLA ppwk last week and after being told ppwk not here, she was instructed to send her ppwk again ([phone]) and mark it urgent.  Patient sent a 5 page fax on 11/20 @ 1244 to number given. She understood about the potential 7-10 day wait, but she says she has done this twice now. She states it needs to be returned to her company by 12/1 so she is very anxious. Explained that desk nurse did not receive fax - it should have been received by [facility] nurse and that she would be contacted. InBasket message sent to nurse currently assigned to complete FMLA ppwk with request to contact patient with update

## 2019-10-29 NOTE — Telephone Encounter (Signed)
-----   Message from Rolland Bimler, RN sent at 10/29/2019  8:47 AM EST ----- Regarding: Please call patient [place], Patient called to ask if her FMLA ppwk had been received. She sent her ppwk earlier this month and it was possibly lost to the fax system, because there is no record of it.     She spoke with Cecille Rubin last week and after being told ppwk not here, she was instructed to send her ppwk again ([phone]) and mark it urgent.  Patient sent a 5 page fax on 11/20 @ 1244 to number given. It needs to be returned to her company by 12/1 so she is very anxious.  She understood about the potential 7-10 day wait, but she says she has done this twice now.   Could you please reach out to her?   Thanks,  [facility]

## 2019-10-29 NOTE — Telephone Encounter (Signed)
Connected with [facility] [place].  Fax received and completed today except for provider signature upon return Monday, 11/04/2019.

## 2019-11-04 ENCOUNTER — Telehealth: Payer: Self-pay | Admitting: *Deleted

## 2019-11-04 NOTE — Telephone Encounter (Signed)
Faxed ppwk for patient FMLA to [phone] - [place]. Fax confirmation received.

## 2019-11-08 ENCOUNTER — Encounter: Payer: 59 | Admitting: Physician Assistant

## 2019-11-19 ENCOUNTER — Other Ambulatory Visit: Payer: Self-pay

## 2019-11-19 ENCOUNTER — Ambulatory Visit (INDEPENDENT_AMBULATORY_CARE_PROVIDER_SITE_OTHER): Payer: 59 | Admitting: Neurology

## 2019-11-19 ENCOUNTER — Encounter: Payer: Self-pay | Admitting: Neurology

## 2019-11-19 VITALS — BP 173/72 | HR 62 | Temp 97.0°F | Ht 62.5 in | Wt 208.0 lb

## 2019-11-19 DIAGNOSIS — C9110 Chronic lymphocytic leukemia of B-cell type not having achieved remission: Secondary | ICD-10-CM

## 2019-11-19 DIAGNOSIS — K2100 Gastro-esophageal reflux disease with esophagitis, without bleeding: Secondary | ICD-10-CM

## 2019-11-19 DIAGNOSIS — T783XXS Angioneurotic edema, sequela: Secondary | ICD-10-CM

## 2019-11-19 DIAGNOSIS — B159 Hepatitis A without hepatic coma: Secondary | ICD-10-CM | POA: Diagnosis not present

## 2019-11-19 DIAGNOSIS — G4733 Obstructive sleep apnea (adult) (pediatric): Secondary | ICD-10-CM

## 2019-11-19 NOTE — Progress Notes (Signed)
SLEEP MEDICINE CLINIC    Provider:  Larey Seat, MD  Primary Care Physician:  [name], MD [address] [place] [address]     Referring Provider: Augustina Mood, DDS          Chief Complaint according to patient   Patient presents with:    . New Patient (Initial Visit)           HISTORY OF PRESENT ILLNESS:  Rebecca Bond is a 73 year- old  Caucasian female patient seen here upon her dentist's  referral on 11/19/2019  for an evaluation of fatigue, with  previously diagnosed OSA.  Chief concern according to patient :  " I wouldn't be here if my dentist didn't ask me"    I have the pleasure of seeing Rebecca Bond today, a right-handed White or Caucasian female with a possible sleep disorder. She  has a past medical history of Anemia, Anxiety, Arthritis, CLL (chronic lymphocytic leukemia) ([place]) (dx'd ~ 2015), Depression, GERD (gastroesophageal reflux disease), Headache, History of kidney stones, Hypertension, OSA (obstructive sleep apnea), Pneumonia, Prediabetes, and Recurrent sinus infections (08/02/2012). she underwent sinus surgery in 1990.  She has not alone CLL which is a fatiguing condition, she has IBS. She has chronic back pain, failed back operations and anemia.  She used to see Dr. Beryle Beams.    She just retired from a 73 year career. She felt forced to after work-stress was harder to bare. FMLA first, then retirement.     The patient had the first sleep study at [facility] with Dr. Claiborne Billings in the year 2014 - The study was performed on Apr 12, 2013 and interpreted by Dr. Shelva Majestic.  The patient at the time had a BMI of 8 was 73 years old and had reported loud snoring and nonrestorative sleep.  Her husband had witnessed her gasping for breath.  The sleep study showed mainly hypopneas and 16 obstructive apneas the total RDI was mild to moderate at 21.3/h during REM sleep however exacerbated to 53.1/h there was not much  additional snoring going on.  At least it did not lead to additional arousals.  She was invited to return for a CPAP titration she began using a ResMed CPAP machine with small nasal pillow- that machine broke about 18 month ago. No therapy since.     Family medical /sleep history: no other family member on CPAP with OSA.    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 Family status is married, with adult children, 5 grandchildren..  Tobacco use, remote 1970.   ETOH use : socially  , Caffeine intake in form of Coffee( 1 cup a week) Soda( 2/ day ) Tea (green- when feeling cold) -no energy drinks. Regular exercise ; none.  Hobbies : back pan prevents these.       Sleep habits are as follows: The patient's dinner time is between 6-7 PM. The patient goes to bed at 10- PM and continues to sleep for many hours, wakes for 3-4  bathroom breaks.   The preferred sleep position is sideways, with the support of 2 pillows.  She has leg pain , back pain, hip pain.  Dreams are reportedly frequent/vivid. Takes gabapentin.   6. 15 AM is the usual rise time. The patient wakes upwith an alarm. She reports not feeling refreshed  or restored in AM, with symptoms such as dry mouth, dull morning headaches, and extreme residual fatigue.  Naps are taken  frequently, lasting from 30-120 minutes in her bedroom while TV is running. these are more refreshing than nocturnal sleep. She feels no interference with nocturnal sleep.    Review of Systems: Out of a complete 14 system review, the patient complains of only the following symptoms, and all other reviewed systems are negative.:  Fatigue, sleepiness , snoring, fragmented sleep, hypersomnia. Nocturia.    How likely are you to doze in the following situations: 0 = not likely, 1 = slight chance, 2 = moderate chance, 3 = high chance   Sitting and Reading? Watching  Television? Sitting inactive in a public place (theater or meeting)? As a passenger in a car for an hour without a break? Lying down in the afternoon when circumstances permit? Sitting and talking to someone? Sitting quietly after lunch without alcohol? 2 In a car, while stopped for a few minutes in traffic?   Total = 2/ 24 points with naps.   FSS endorsed at 50/ 63 points.   Social History   Socioeconomic History  . Marital status: Married    Spouse name: Not on file  . Number of children: 1  . Years of education: Not on file  . Highest education level: Not on file  Occupational History  . Occupation: CUST SERVICE    Employer: [employer]  Tobacco Use  . Smoking status: Former Smoker    Packs/day: 0.75    Years: 4.00    Pack years: 3.00    Types: Cigarettes    Quit date: 12/05/1968    Years since quitting: 50.9  . Smokeless tobacco: Never Used  Substance and Sexual Activity  . Alcohol use: Yes    Alcohol/week: 7.0 standard drinks    Types: 7 Glasses of wine per week  . Drug use: No  . Sexual activity: Not Currently  Other Topics Concern  . Not on file  Social History Narrative  . Not on file   Social Determinants of Health   Financial Resource Strain:   . Difficulty of Paying Living Expenses: Not on file  Food Insecurity:   . Worried About Charity fundraiser in the Last Year: Not on file  . Ran Out of Food in the Last Year: Not on file  Transportation Needs:   . Lack of Transportation (Medical): Not on file  . Lack of Transportation (Non-Medical): Not on file  Physical Activity:   . Days of Exercise per Week: Not on file  . Minutes of Exercise per Session: Not on file  Stress:   . Feeling of Stress : Not on file  Social Connections:   . Frequency of Communication with Friends and Family: Not on file  . Frequency of Social Gatherings with Friends and Family: Not on file  . Attends Religious Services: Not on file  . Active Member of Clubs or  Organizations: Not on file  . Attends Archivist Meetings: Not on file  . Marital Status: Not on file    Family History  Problem Relation Age of Onset  . Parkinson's disease Mother   . Hypertension Mother   . Hypertension Maternal Grandmother   . Heart attack Maternal Grandmother        Mild    Past Medical History:  Diagnosis Date  . Anemia   . Anxiety   . Arthritis   . CLL (chronic lymphocytic leukemia) (  [place]) dx'd ~ 2015  . Depression   . GERD (gastroesophageal reflux disease)   . Headache    "sinus headaches"  . History of kidney stones   . Hypertension   . OSA (obstructive sleep apnea)   . Pneumonia   . Prediabetes   . Recurrent sinus infections 08/02/2012    Past Surgical History:  Procedure Laterality Date  . ABDOMINAL HYSTERECTOMY  1980's   "endometrosis"  . APPENDECTOMY    . BREAST SURGERY    . CATARACT EXTRACTION, BILATERAL    . CHOLECYSTECTOMY  1985  . FUNCTIONAL ENDOSCOPIC SINUS SURGERY  1990's   "cause I kept having sinus infections"  . immunoglobulin treatment  2017  . LAPAROSCOPIC APPENDECTOMY N/A 06/04/2014   Procedure: APPENDECTOMY LAPAROSCOPIC;  Surgeon: Zenovia Jarred, MD;  Location: [facility];  Service: General;  Laterality: N/A;  . LUMBAR LAMINECTOMY/DECOMPRESSION MICRODISCECTOMY Left 11/01/2017   Procedure: Left Lumbar Four-Five Extraforaminal Microdiscectomy;  Surgeon: Eustace Moore, MD;  Location: [facility];  Service: Neurosurgery;  Laterality: Left;  . LYMPH NODE BIOPSY     "determined I had CLL"  . SHOULDER ARTHROSCOPY Right   . TEMPOROMANDIBULAR JOINT ARTHROPLASTY  1980's  . TONSILLECTOMY AND ADENOIDECTOMY  1950's     Current Outpatient Medications on File Prior to Visit  Medication Sig Dispense Refill  . ALPRAZolam (XANAX) 1 MG tablet Take 1/2-1 tablet 2 - 3 x /day ONLY if needed for Anxiety Attack &  limit to 5 days /week to avoid addiction 90 tablet 0  . Ascorbic Acid (VITAMIN C) 1000 MG tablet Take 1,500 mg by mouth daily.      . bisoprolol-hydrochlorothiazide (ZIAC) 10-6.25 MG tablet Take 1 tablet Daily for BP 90 tablet 3  . cetirizine (ZYRTEC) 10 MG tablet Take 10 mg by mouth daily.    . Cholecalciferol (D 2000) 2000 units TABS Take 10,000 Units by mouth daily.     . cholestyramine light (PREVALITE) 4 g packet Take 1 packet in liquid  2 x / day for cholesterol 180 each 3  . escitalopram (LEXAPRO) 10 MG tablet TAKE ONE TABLET BY MOUTH DAILY FOR MOOD 30 tablet 5  . ezetimibe (ZETIA) 10 MG tablet Take 1 tablet Daily for Cholesterol 90 tablet 3  . famotidine (PEPCID) 40 MG tablet Take 1 tablet daily for Heartburn & Reflux 90 tablet 3  . fluorometholone (FML) 0.1 % ophthalmic suspension SHAKE LQ AND INT 1 GTT IN OU BID    . gabapentin (NEURONTIN) 300 MG capsule 300 mg 3 (three) times daily.     . nitrofurantoin, macrocrystal-monohydrate, (MACROBID) 100 MG capsule Take 1 capsule 2 x /day with a Meal for 1 week for UTI 14 capsule 0  . Olopatadine HCl 0.2 % SOLN INT 1 GTT IN OU QD    . omeprazole (PRILOSEC) 40 MG capsule Take 1 capsule (40 mg total) by mouth daily. 90 capsule 1  . ondansetron (ZOFRAN) 4 MG tablet Once daily for diarrhea 30 tablet 1  . Probiotic Product (PROBIOTIC PO) Take 1 tablet daily by mouth.    . RESTASIS 0.05 % ophthalmic emulsion INT 1 GTT IN OU BID    . triamcinolone cream (KENALOG) 0.5 % Apply 1 application topically 2 (two) times daily. 80 g 2   No current facility-administered medications on file prior to visit.    Allergies  Allergen Reactions  . Hyzaar [Losartan Potassium-Hctz] Other (See Comments)    "messed up my sodium counts" swells up lips.  [name] [Pseudoephedrine Hcl]  Palpitations  . Levaquin [Levofloxacin In D5w] Diarrhea and Nausea Only  . Acyclovir And Related   . Biaxin [Clarithromycin]     GI Upset  . Celexa [Citalopram]   . Flexeril [Cyclobenzaprine]     "zombie-like" feeling  . Fosamax [Alendronate Sodium]     GI upset  . Iohexol Hives     Code: HIVES, Desc: pt  states she broke out in hive 20 yrs ago from IV contrast.     . Losartan     Angioedema  . Meloxicam     GI upset  . Norvasc [Amlodipine] Swelling  . Pseudoephedrine     Palpitations  . Zoloft [Sertraline Hcl] Other (See Comments)    Has no emotions at all     Physical exam:  Today's Vitals   11/19/19 1306  BP: (!) 173/72  Pulse: 62  Temp: (!) 97 F (36.1 C)  Weight: 208 lb (94.3 kg)  Height: 5' 2.5" (1.588 m)   Body mass index is 37.44 kg/m.   Wt Readings from Last 3 Encounters:  11/19/19 208 lb (94.3 kg)  10/15/19 216 lb 9.6 oz (98.2 kg)  09/26/19 214 lb 3.2 oz (97.2 kg)     Ht Readings from Last 3 Encounters:  11/19/19 5' 2.5" (1.588 m)  10/15/19 5\' 2"  (1.575 m)  09/26/19 5\' 2"  (1.575 m)      General: The patient is awake, alert and appears not in acute distress. The patient is well groomed. Head: Normocephalic, atraumatic. Neck is supple. Mallampati 3,  neck circumference:17 inches . Nasal airflow  patent.  Retrognathia is not seen. Larger neck, stooped cervical spine.  Dental status:  Intact  Cardiovascular:  Regular rate and cardiac rhythm by pulse,  without distended neck veins. Respiratory: Lungs are clear to auscultation.  Skin:  Without evidence of ankle edema, or rash. Trunk: The patient's posture is erect.   Neurologic exam : The patient is awake and alert, oriented to place and time.   Memory subjective described as intact.  Attention span & concentration ability appears normal.  Speech is fluent,  without  dysarthria, dysphonia or aphasia.  Mood and affect are appropriate.   Cranial nerves: no loss of smell or taste reported  Pupils are equal and briskly reactive to light. Funduscopic exam deferred. .  Extraocular movements in vertical and horizontal planes were intact and without nystagmus. No Diplopia. Visual fields by finger perimetry are intact. Hearing was intact to soft voice and finger rubbing.    Facial sensation intact to fine  touch.  Facial motor strength is symmetric and tongue and uvula move midline.  Neck ROM : rotation, tilt and flexion extension were normal for age and shoulder shrug was symmetrical.    Motor exam:  Symmetric bulk, tone and ROM.  Crepitations in both knees.   Normal tone without cog wheeling, symmetric grip strength .  weakness in hip flexion and abduction.  Sensory:  Fine touch, pinprick and vibration were  normal.  Proprioception tested in the upper extremities was normal. Coordination: Rapid alternating movements in the fingers/hands were of normal speed.  The Finger-to-nose maneuver was intact without evidence of ataxia, dysmetria or tremor. Gait and station: Patient could rise unassisted from a seated position, walked without assistive device. She has a slight limp, posture is stooped.  Stance is of normal width/ base. Toe and heel walk were deferred.  Deep tendon reflexes: in the  upper and lower extremities are symmetric and intact.  Babinski response was deferred.  After spending a total time of 45  minutes face to face and additional time for physical and neurologic examination, review of laboratory studies,  personal review of imaging studies, reports and results of other testing and review of referral information / records as far as provided in visit, I have established the following assessments:  1)   Mrs. Baz has several possible and plausible causes for high fatigue : CLL is the main culprit. IBS can be fatiguing and her chronic pain as well.   2) Her OSA as diagnosed in 2014 has likely worsened , and is currently untreated.  immune-supressed and morbidly obese, anemic. She has many risk factors and many new since 2014.     My Plan is to proceed with:  1) HST or attended sleep study- as the patient is leukemic, it would be her preference to not stay in a facility.  I hope she can regain some energy from having OSA treated.    I would like to thank [name],  MD and [name], [address] [place],  [place] [address] for allowing me to meet with and to take care of this pleasant patient.   In short, Rebecca Bond is presenting with fatigue, excessive and severe. I plan to follow up either personally or through our NP within 2-3  month.   CC: I will share my notes with: PCP  Electronically signed by: Larey Seat, MD 11/19/2019 1:07 PM  [facility] Board certified by The [employer] of Sleep Medicine and Diplomate of the [employer] of Sleep Medicine. Board certified In Neurology through the [place], Fellow of the [employer] of Neurology. Medical Director of [facility].

## 2019-11-19 NOTE — Patient Instructions (Signed)
Fatigue If you have fatigue, you feel tired all the time and have a lack of energy or a lack of motivation. Fatigue may make it difficult to start or complete tasks because of exhaustion. In general, occasional or mild fatigue is often a normal response to activity or life. However, long-lasting (chronic) or extreme fatigue may be a symptom of a medical condition. Follow these instructions at home: General instructions  Watch your fatigue for any changes.  Go to bed and get up at the same time every day.  Avoid fatigue by pacing yourself during the day and getting enough sleep at night.  Maintain a healthy weight. Medicines  Take over-the-counter and prescription medicines only as told by your health care provider.  Take a multivitamin, if told by your health care provider.  Do not use herbal or dietary supplements unless they are approved by your health care provider. Activity   Exercise regularly, as told by your health care provider.  Use or practice techniques to help you relax, such as yoga, tai chi, meditation, or massage therapy. Eating and drinking   Avoid heavy meals in the evening.  Eat a well-balanced diet, which includes lean proteins, whole grains, plenty of fruits and vegetables, and low-fat dairy products.  Avoid consuming too much caffeine.  Avoid the use of alcohol.  Drink enough fluid to keep your urine pale yellow. Lifestyle  Change situations that cause you stress. Try to keep your work and personal schedule in balance.  Do not use any products that contain nicotine or tobacco, such as cigarettes and e-cigarettes. If you need help quitting, ask your health care provider.  Do not use drugs. Contact a health care provider if:  Your fatigue does not get better.  You have a fever.  You suddenly lose or gain weight.  You have headaches.  You have trouble falling asleep or sleeping through the night.  You feel angry, guilty, anxious, or  sad.  You are unable to have a bowel movement (constipation).  Your skin is dry.  You have swelling in your legs or another part of your body. Get help right away if:  You feel confused.  Your vision is blurry.  You feel faint or you pass out.  You have a severe headache.  You have severe pain in your abdomen, your back, or the area between your waist and hips (pelvis).  You have chest pain, shortness of breath, or an irregular or fast heartbeat.  You are unable to urinate, or you urinate less than normal.  You have abnormal bleeding, such as bleeding from the rectum, vagina, nose, lungs, or nipples.  You vomit blood.  You have thoughts about hurting yourself or others. If you ever feel like you may hurt yourself or others, or have thoughts about taking your own life, get help right away. You can go to your nearest emergency department or call:  Your local emergency services (911 in the [place]  A suicide crisis helpline, such as the [place] at 9164775271. This is open 24 hours a day. Summary  If you have fatigue, you feel tired all the time and have a lack of energy or a lack of motivation.  Fatigue may make it difficult to start or complete tasks because of exhaustion.  Long-lasting (chronic) or extreme fatigue may be a symptom of a medical condition.  Exercise regularly, as told by your health care provider.  Change situations that cause you stress. Try to keep your  work and personal schedule in balance. This information is not intended to replace advice given to you by your health care provider. Make sure you discuss any questions you have with your health care provider. Document Released: 09/18/2007 Document Revised: 03/14/2019 Document Reviewed: 08/16/2017 Elsevier Patient Education  2020 [place] for Sleep Apnea  Sleep apnea is a condition in which breathing pauses or becomes shallow during sleep. Sleep apnea  screening is a test to determine if you are at risk for sleep apnea. The test is easy and only takes a few minutes. Your health care provider may ask you to have this test in preparation for surgery or as part of a physical exam. What are the symptoms of sleep apnea? Common symptoms of sleep apnea include:  Snoring.  Restless sleep.  Daytime sleepiness.  Pauses in breathing.  Choking during sleep.  Irritability.  Forgetfulness.  Trouble thinking clearly.  Depression.  Personality changes. Most people with sleep apnea are not aware that they have it. Why should I get screened? Getting screened for sleep apnea can help:  Ensure your safety. It is important for your health care providers to know whether or not you have sleep apnea, especially if you are having surgery or have other long-term (chronic) health conditions.  Improve your health and allow you to get a better night's rest. Restful sleep can help you: ? Have more energy. ? Lose weight. ? Improve high blood pressure. ? Improve diabetes management. ? Prevent stroke. ? Prevent car accidents. How is screening done? Screening usually includes being asked a list of questions about your sleep quality. Some questions you may be asked include:  Do you snore?  Is your sleep restless?  Do you have daytime sleepiness?  Has a partner or spouse told you that you stop breathing during sleep?  Have you had trouble concentrating or memory loss? If your screening test is positive, you are at risk for the condition. Further testing may be needed to confirm a diagnosis of sleep apnea. Where to find more information You can find screening tools online or at your health care clinic. For more information about sleep apnea screening and healthy sleep, visit these websites:  Centers for Disease Control and Prevention: [employer]pl  American Sleep Apnea Association: www.sleepapnea.org Contact a health care provider  if:  You think that you may have sleep apnea. Summary  Sleep apnea screening can help determine if you are at risk for sleep apnea.  It is important for your health care providers to know whether or not you have sleep apnea, especially if you are having surgery or have other chronic health conditions.  You may be asked to take a screening test for sleep apnea in preparation for surgery or as part of a physical exam. This information is not intended to replace advice given to you by your health care provider. Make sure you discuss any questions you have with your health care provider. Document Released: 03/03/2017 Document Revised: 09/07/2018 Document Reviewed: 03/03/2017 Elsevier Patient Education  2020 [employer].

## 2019-11-21 ENCOUNTER — Encounter: Payer: Self-pay | Admitting: Internal Medicine

## 2019-11-21 ENCOUNTER — Other Ambulatory Visit: Payer: Self-pay | Admitting: Adult Health

## 2019-11-21 DIAGNOSIS — F411 Generalized anxiety disorder: Secondary | ICD-10-CM

## 2019-11-30 ENCOUNTER — Other Ambulatory Visit: Payer: Self-pay | Admitting: Physician Assistant

## 2019-11-30 DIAGNOSIS — K219 Gastro-esophageal reflux disease without esophagitis: Secondary | ICD-10-CM

## 2019-12-02 ENCOUNTER — Ambulatory Visit (INDEPENDENT_AMBULATORY_CARE_PROVIDER_SITE_OTHER): Payer: 59 | Admitting: Neurology

## 2019-12-02 DIAGNOSIS — B159 Hepatitis A without hepatic coma: Secondary | ICD-10-CM

## 2019-12-02 DIAGNOSIS — C9110 Chronic lymphocytic leukemia of B-cell type not having achieved remission: Secondary | ICD-10-CM

## 2019-12-02 DIAGNOSIS — G4733 Obstructive sleep apnea (adult) (pediatric): Secondary | ICD-10-CM

## 2019-12-02 DIAGNOSIS — K2100 Gastro-esophageal reflux disease with esophagitis, without bleeding: Secondary | ICD-10-CM

## 2019-12-02 DIAGNOSIS — T783XXS Angioneurotic edema, sequela: Secondary | ICD-10-CM

## 2019-12-16 ENCOUNTER — Telehealth: Payer: Self-pay | Admitting: *Deleted

## 2019-12-16 NOTE — Telephone Encounter (Signed)
Received voice mail from patient: She has cancelled her January f/u appt with Dr. Irene Limbo and wants to schedule appt in February. Wants to have lab work at [facility] instead of PCP. Schedule message sent.  LVM for patient with this information that she will be contacted by scheduler.

## 2019-12-17 ENCOUNTER — Telehealth: Payer: Self-pay | Admitting: Hematology

## 2019-12-17 NOTE — Telephone Encounter (Signed)
Scheduled appt per 1/11 sch message- pt aware of appt date and time   

## 2019-12-18 ENCOUNTER — Ambulatory Visit: Payer: Medicare Other | Attending: Internal Medicine

## 2019-12-18 DIAGNOSIS — Z20822 Contact with and (suspected) exposure to covid-19: Secondary | ICD-10-CM

## 2019-12-19 LAB — NOVEL CORONAVIRUS, NAA: SARS-CoV-2, NAA: NOT DETECTED

## 2019-12-20 DIAGNOSIS — M5136 Other intervertebral disc degeneration, lumbar region: Secondary | ICD-10-CM | POA: Diagnosis not present

## 2019-12-20 DIAGNOSIS — M5416 Radiculopathy, lumbar region: Secondary | ICD-10-CM | POA: Diagnosis not present

## 2019-12-20 DIAGNOSIS — M545 Low back pain: Secondary | ICD-10-CM | POA: Diagnosis not present

## 2019-12-20 DIAGNOSIS — M961 Postlaminectomy syndrome, not elsewhere classified: Secondary | ICD-10-CM | POA: Diagnosis not present

## 2019-12-23 ENCOUNTER — Ambulatory Visit: Payer: 59 | Admitting: Hematology

## 2019-12-24 ENCOUNTER — Telehealth: Payer: Self-pay | Admitting: General Practice

## 2019-12-24 NOTE — Telephone Encounter (Signed)
Negative COVID results given. Patient results "NOT Detected." Caller expressed understanding. ° °

## 2019-12-24 NOTE — Progress Notes (Signed)
Mild sleep apnea was found at AHI 13.9/h and REM AHI of 16.7/h. no prolonged hypoxia was noted. All sleep recorded was in supine position.   Recommendations:    This mild degree of apnea can be treated with CPAP, a dental device or weight loss. avoiding supine sleep may help as well.  If the patient is willing to start CPAP or rather would like a dental device for therapy of mild apnea will determine how to proceed. Please ask patient's preference ,and if CPAP is her goal, please place order 5-15 cm water 3 cm EPR, mask of choice and heated humidification.   Interpreting Physician: Larey Seat, MD

## 2019-12-24 NOTE — Procedures (Signed)
  Patient Information     First Name: Jillann Last Name: [place] ID: [id number]  Birth Date: [birth year] Age: 74 Gender: Female  Referring Provider: [name], MD BMI: 36.7 (W=207 lb, H=5' 3'')  Neck Circ.:  17 '' Epworth:  2/24   Sleep Study Information    Study Date: Dec 02, 2019 S/H/A Version: 001.001.001.001 / 4.1.1528 / 17   History:    Rebecca Bond is a 74 year- old Caucasian female patient seen here upon her dentist's  referral on 11/19/2019  for an evaluation of fatigue, with  previously diagnosed OSA. Chief concern according to patient :  " I wouldn't be here if my dentist didn't ask me" Rebecca Bond is a right-handed White or Caucasian female with a possible sleep disorder. 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 she underwent sinus surgery in 1990. She has not alone CLL which is a fatiguing condition, she has IBS. She has chronic back pain, failed back operations and anemia.     Summary & Diagnosis:     Mild sleep apnea was found at AHI 13.9/h and REM AHI of 16.7/h. no prolonged hypoxia was noted. All sleep recorded was in supine position.   Recommendations:     This mild degree of apnea can be treated with CPAP, a dental device or weight loss . avoiding supine sleep may help as well.  If the patient is willing to start CPAP or rather would like a dental device for therapy of mild apnea will determine how to proceed. Please ask patient's preference and if CPAP id her goal : Please place order 5-15 cm water 3 cm EPR, mask of choice and heated humidification.   Interpreting Physician: Larey Seat, MD            Sleep Summary  Oxygen Saturation Statistics   Start Study Time: End Study  Time: Total Recording Time:  10:13:54 PM 8:17:55 AM 10 h, 4 min  Total Sleep Time % REM of Sleep Time:  9 h, 12 min 20.4    Mean: 93 Minimum: 85 Maximum: 99  Mean of Desaturations Nadirs (%):   90  Oxygen Desaturation. %:   4-9 10-20 >20 Total  Events Number Total    52  3 94.5 5.5  0 0.0  55 100.0  Oxygen Saturation: <90 <=88 <85 <80 <70  Duration (minutes): Sleep % 1.6 0.3  0.8 0.0  0.1 0.0 0.0 0.0 0.0 0.0     Respiratory Indices      Total Events REM NREM All Night  pRDI:  132  pAHI:  127 ODI:  55  pAHIc:  11  % CSR: 0.0 18.4 16.7 6.5 2.3 13.4 13.2 5.9 1.4 14.4 13.9 6.0 1.6       Pulse Rate Statistics during Sleep (BPM)      Mean: 67 Minimum: 44 Maximum: 89    Indices are calculated using technically valid sleep time of  9 h, 9 min. Central-Indices are calculated using technically valid sleep time of  6  h 57 min. pRDI/pAHI are calculated using oxi desaturations ? 3%

## 2019-12-25 ENCOUNTER — Telehealth: Payer: Self-pay | Admitting: Neurology

## 2019-12-25 NOTE — Telephone Encounter (Signed)
Called the patient and reviewed her sleep study results with her in detail. Informed her that Dr Brett Fairy would recommend 1st an auto CPAP to help with treatment of this. I reviewed what the auto CPAP looks like and how that process would work. I informed her that a alternative could be a dental device. The patient states that she has had TMJ surgery and doesn't feel that would be the best option. Advised on weight loss, patient states she typically sleeps on her side. She would like me to send the CPAP order off to get an idea on the cost to decide if she can move forward with that in the mean time. Advised I would do that. In meantime patient states she will work on weight loss. Patient will contact me back when or if she would like to start the CPAP.   ** If patient calls back stating she would like to start CPAP please schedule her with a follow up date 31-90 days from when she starts CPAP. (probably closer to 60 day mark.) This can be with Dr Brett Fairy, Jinny Blossom or Amy NP

## 2019-12-25 NOTE — Telephone Encounter (Signed)
-----   Message from Larey Seat, MD sent at 12/24/2019  6:45 PM EST ----- Mild sleep apnea was found at AHI 13.9/h and REM AHI of 16.7/h. no prolonged hypoxia was noted. All sleep recorded was in supine position.   Recommendations:    This mild degree of apnea can be treated with CPAP, a dental device or weight loss. avoiding supine sleep may help as well.  If the patient is willing to start CPAP or rather would like a dental device for therapy of mild apnea will determine how to proceed. Please ask patient's preference ,and if CPAP is her goal, please place order 5-15 cm water 3 cm EPR, mask of choice and heated humidification.   Interpreting Physician: Larey Seat, MD

## 2019-12-26 ENCOUNTER — Ambulatory Visit: Payer: 59 | Admitting: Hematology

## 2020-01-06 ENCOUNTER — Other Ambulatory Visit: Payer: Self-pay | Admitting: Neurology

## 2020-01-06 DIAGNOSIS — G4733 Obstructive sleep apnea (adult) (pediatric): Secondary | ICD-10-CM

## 2020-01-16 ENCOUNTER — Ambulatory Visit: Payer: 59 | Admitting: Physician Assistant

## 2020-01-21 ENCOUNTER — Telehealth: Payer: Self-pay | Admitting: *Deleted

## 2020-01-21 NOTE — Telephone Encounter (Signed)
Patient called re: appts 2/17. She had recent fall (bedroom slippers are too big) and is still very sore. She wants to reschedule appts in 2 weeks. Appts for 2/17 cancelled. Schedule message sent. Patient informed that scheduling will call.

## 2020-01-22 ENCOUNTER — Inpatient Hospital Stay: Payer: Medicare Other | Admitting: Hematology

## 2020-01-22 ENCOUNTER — Inpatient Hospital Stay: Payer: Medicare Other

## 2020-01-23 ENCOUNTER — Telehealth: Payer: Self-pay | Admitting: Hematology

## 2020-01-23 NOTE — Telephone Encounter (Signed)
Received a notification from [employer], "02/04 called pt to go over financials and scheduling, no answer lvm 02/09 spoke with pt, she advised she had company and wanted me to call back tomorrow 02/10 called pt, we went over financials, she asked I send this information over in an email, email sent. 02/15 called patient to follow up, pt advised she will not be doing this right now, I let her know if anything changes, she can give me a call. I have voided out this order, just wanted to make you all aware. "

## 2020-01-23 NOTE — Telephone Encounter (Signed)
Rescheduled per 2/16 sch msg, pt req. Called and spoke with pt, confirmed 3/17 appt

## 2020-02-06 ENCOUNTER — Other Ambulatory Visit: Payer: Self-pay | Admitting: Internal Medicine

## 2020-02-19 ENCOUNTER — Inpatient Hospital Stay: Payer: Medicare Other | Admitting: Hematology

## 2020-02-19 ENCOUNTER — Inpatient Hospital Stay: Payer: Medicare Other | Attending: Hematology

## 2020-02-19 ENCOUNTER — Other Ambulatory Visit: Payer: Self-pay

## 2020-02-19 VITALS — BP 168/73 | HR 67 | Temp 98.3°F | Resp 18 | Ht 62.5 in | Wt 213.3 lb

## 2020-02-19 DIAGNOSIS — Z9181 History of falling: Secondary | ICD-10-CM | POA: Insufficient documentation

## 2020-02-19 DIAGNOSIS — D649 Anemia, unspecified: Secondary | ICD-10-CM | POA: Diagnosis not present

## 2020-02-19 DIAGNOSIS — I10 Essential (primary) hypertension: Secondary | ICD-10-CM | POA: Diagnosis not present

## 2020-02-19 DIAGNOSIS — Z79899 Other long term (current) drug therapy: Secondary | ICD-10-CM | POA: Diagnosis not present

## 2020-02-19 DIAGNOSIS — F419 Anxiety disorder, unspecified: Secondary | ICD-10-CM | POA: Insufficient documentation

## 2020-02-19 DIAGNOSIS — Z85828 Personal history of other malignant neoplasm of skin: Secondary | ICD-10-CM | POA: Insufficient documentation

## 2020-02-19 DIAGNOSIS — M549 Dorsalgia, unspecified: Secondary | ICD-10-CM | POA: Diagnosis not present

## 2020-02-19 DIAGNOSIS — C9110 Chronic lymphocytic leukemia of B-cell type not having achieved remission: Secondary | ICD-10-CM

## 2020-02-19 DIAGNOSIS — M1990 Unspecified osteoarthritis, unspecified site: Secondary | ICD-10-CM | POA: Diagnosis not present

## 2020-02-19 DIAGNOSIS — D696 Thrombocytopenia, unspecified: Secondary | ICD-10-CM

## 2020-02-19 DIAGNOSIS — Z87891 Personal history of nicotine dependence: Secondary | ICD-10-CM | POA: Insufficient documentation

## 2020-02-19 LAB — CBC WITH DIFFERENTIAL/PLATELET
Abs Immature Granulocytes: 0.31 10*3/uL — ABNORMAL HIGH (ref 0.00–0.07)
Basophils Absolute: 0 10*3/uL (ref 0.0–0.1)
Basophils Relative: 0 %
Eosinophils Absolute: 0.3 10*3/uL (ref 0.0–0.5)
Eosinophils Relative: 0 %
HCT: 32.7 % — ABNORMAL LOW (ref 36.0–46.0)
Hemoglobin: 10.5 g/dL — ABNORMAL LOW (ref 12.0–15.0)
Immature Granulocytes: 0 %
Lymphocytes Relative: 91 %
Lymphs Abs: 73.8 10*3/uL — ABNORMAL HIGH (ref 0.7–4.0)
MCH: 33.1 pg (ref 26.0–34.0)
MCHC: 32.1 g/dL (ref 30.0–36.0)
MCV: 103.2 fL — ABNORMAL HIGH (ref 80.0–100.0)
Monocytes Absolute: 1.6 10*3/uL — ABNORMAL HIGH (ref 0.1–1.0)
Monocytes Relative: 2 %
Neutro Abs: 5.4 10*3/uL (ref 1.7–7.7)
Neutrophils Relative %: 7 %
Platelets: 66 10*3/uL — ABNORMAL LOW (ref 150–400)
RBC: 3.17 MIL/uL — ABNORMAL LOW (ref 3.87–5.11)
RDW: 14.5 % (ref 11.5–15.5)
WBC: 81.4 10*3/uL (ref 4.0–10.5)
nRBC: 0 % (ref 0.0–0.2)

## 2020-02-19 LAB — LACTATE DEHYDROGENASE: LDH: 203 U/L — ABNORMAL HIGH (ref 98–192)

## 2020-02-19 LAB — CMP (CANCER CENTER ONLY)
ALT: 16 U/L (ref 0–44)
AST: 24 U/L (ref 15–41)
Albumin: 3.9 g/dL (ref 3.5–5.0)
Alkaline Phosphatase: 82 U/L (ref 38–126)
Anion gap: 11 (ref 5–15)
BUN: 12 mg/dL (ref 8–23)
CO2: 25 mmol/L (ref 22–32)
Calcium: 9.1 mg/dL (ref 8.9–10.3)
Chloride: 98 mmol/L (ref 98–111)
Creatinine: 0.82 mg/dL (ref 0.44–1.00)
GFR, Est AFR Am: >60 mL/min (ref >60)
GFR, Estimated: >60 mL/min (ref >60)
Glucose, Bld: 103 mg/dL — ABNORMAL HIGH (ref 70–99)
Potassium: 4 mmol/L (ref 3.5–5.1)
Sodium: 134 mmol/L — ABNORMAL LOW (ref 135–145)
Total Bilirubin: 0.8 mg/dL (ref 0.3–1.2)
Total Protein: 6.2 g/dL — ABNORMAL LOW (ref 6.5–8.1)

## 2020-02-19 NOTE — Progress Notes (Signed)
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  INTERVAL HISTORY:  Rebecca Bond returns today for management and evaluation of [name]. The patient's last visit with [place] was on 09/18/2019. The pt reports that she is doing well overall.  The pt reports that she has retired and has been staying at home for safety reasons during the pandemic. She does feel more calm since retirement, but has been feeling lonely due to staying in the house for so long. Pt does live with her husband.  Pt is looking forward to going to the grocery store and shopping. She is interested in taking a trip to [place], [place] in April. She has received both doses of the COVID19 vaccine and tolerated them well.   Pt has been recommended pt receive PT for her back pain.   Pt also fell about 2-3 weeks ago after tripping over her ottoman due to her feet slipping out of her shoes. She has a large bruise above her left hip and on her breast.   Lab results today (02/19/20) of CBC w/diff and CMP is as follows: all values are WNL except for WBC at  81.4K, RBC at 3.17, Hgb at 10.5, HCT at 32.7, MCV at 103.2, PLT at 66K, Lymphs Abs at 73.8K, Mono Abs at 1.6K, Abs Immature Granulocytes at 0.31K, Smudge Cells are "Present", Sodium at 134, Glucose at 103, Total Protein at 6.2. 02/19/2020 LDH at 203  On review of systems, pt reports bruises, falls, chronic back pain and denies fevers, chills, night sweats, infection issues, nose bleeds, gum bleeds, bloody/black stools, new lumps/bumps and any other symptoms.    REVIEW OF SYSTEMS:   A 10+ POINT REVIEW OF SYSTEMS WAS OBTAINED including neurology, dermatology, psychiatry, cardiac, respiratory, lymph, extremities, GI, GU, Musculoskeletal, constitutional, breasts, reproductive, HEENT.  All pertinent positives are noted in the HPI.  All others are negative.   Past Medical History:  Diagnosis Date  . Anemia   . Anxiety   . Arthritis   . CLL (chronic lymphocytic leukemia) ([place]) dx'd ~ 2015  . Depression   . GERD (gastroesophageal reflux disease)   . Headache    "sinus headaches"  . History of kidney stones   . Hypertension   . OSA (obstructive sleep apnea)   . Pneumonia   . Prediabetes   . Recurrent sinus infections 08/02/2012    . Past Surgical History:  Procedure Laterality Date  . ABDOMINAL HYSTERECTOMY  1980's   "endometrosis"  . APPENDECTOMY    . BREAST SURGERY    . CATARACT EXTRACTION, BILATERAL    . CHOLECYSTECTOMY  1985  . FUNCTIONAL ENDOSCOPIC SINUS SURGERY  1990's   "cause I kept  having sinus infections"  . immunoglobulin treatment  2017  . LAPAROSCOPIC APPENDECTOMY N/A 06/04/2014   Procedure: APPENDECTOMY LAPAROSCOPIC;  Surgeon: Zenovia Jarred, MD;  Location: [facility];  Service: General;  Laterality: N/A;  . LUMBAR LAMINECTOMY/DECOMPRESSION MICRODISCECTOMY Left 11/01/2017   Procedure: Left Lumbar Four-Five Extraforaminal Microdiscectomy;  Surgeon: Eustace Moore, MD;  Location: [facility];  Service: Neurosurgery;  Laterality: Left;  . LYMPH NODE BIOPSY     "determined I had CLL"   . SHOULDER ARTHROSCOPY Right   . TEMPOROMANDIBULAR JOINT ARTHROPLASTY  1980's  . TONSILLECTOMY AND ADENOIDECTOMY  1950's    . Social History   Tobacco Use  . Smoking status: Former Smoker    Packs/day: 0.75    Years: 4.00    Pack years: 3.00    Types: Cigarettes    Quit date: 12/05/1968    Years since quitting: 51.2  . Smokeless tobacco: Never Used  Substance Use Topics  . Alcohol use: Yes    Alcohol/week: 7.0 standard drinks    Types: 7 Glasses of wine per week  . Drug use: No    ALLERGIES:  is allergic to hyzaar [losartan potassium-hctz]; sudafed [pseudoephedrine hcl]; levaquin [levofloxacin in d5w]; acyclovir and related; biaxin [clarithromycin]; celexa [citalopram]; flexeril [cyclobenzaprine]; fosamax [alendronate sodium]; iohexol; losartan; meloxicam; norvasc [amlodipine]; pseudoephedrine; and zoloft [sertraline hcl].  MEDICATIONS:  Current Outpatient Medications  Medication Sig Dispense Refill  . ALPRAZolam (XANAX) 1 MG tablet Take 1/2 - 1 tablet   2 - 3 x /day ONLY if needed for Anxiety Attack &  limit to 5 days /week to avoid Addiction & Dementia 90 tablet 0  . Ascorbic Acid (VITAMIN C) 1000 MG tablet Take 1,500 mg by mouth daily.     . bisoprolol-hydrochlorothiazide (ZIAC) 10-6.25 MG tablet Take 1 tablet Daily for BP 90 tablet 3  . cetirizine (ZYRTEC) 10 MG tablet Take 10 mg by mouth daily.    [name] Kitchen escitalopram (LEXAPRO) 10 MG tablet Take 1 tablet Daily for Mood 90 tablet 3  . ezetimibe (ZETIA) 10 MG tablet Take 1 tablet Daily for Cholesterol 90 tablet 3  . gabapentin (NEURONTIN) 300 MG capsule 300 mg 3 (three) times daily.     . nitrofurantoin, macrocrystal-monohydrate, (MACROBID) 100 MG capsule Take 1 capsule 2 x /day with a Meal for 1 week for UTI 14 capsule 0  . omeprazole (PRILOSEC) 40 MG capsule Take 1 capsule (40 mg total) by mouth daily. 90 capsule 1  . Probiotic Product (PROBIOTIC PO) Take 1 tablet daily by mouth.    . Cholecalciferol (D 2000) 2000 units TABS  Take 10,000 Units by mouth daily.     . cholestyramine light (PREVALITE) 4 g packet Take 1 packet in liquid  2 x / day for cholesterol (Patient not taking: Reported on 02/19/2020) 180 each 3  . famotidine (PEPCID) 40 MG tablet Take 1 tablet Daily for Indigestion & Heartburn (Patient not taking: Reported on 02/19/2020) 90 tablet 3  . fluorometholone (FML) 0.1 % ophthalmic suspension SHAKE LQ AND INT 1 GTT IN OU BID    . Olopatadine HCl 0.2 % SOLN INT 1 GTT IN OU QD    . ondansetron (ZOFRAN) 4 MG tablet Once daily for diarrhea (Patient not taking: Reported on 02/19/2020) 30 tablet 1  . RESTASIS 0.05 % ophthalmic emulsion INT 1 GTT IN OU BID    . triamcinolone cream (KENALOG) 0.5 % Apply 1 application topically 2 (two) times daily. (Patient not taking: Reported on 02/19/2020) 80 g 2  No current facility-administered medications for this visit.    PHYSICAL EXAMINATION: ECOG PERFORMANCE STATUS: 1 - Symptomatic but completely ambulatory  Vitals:   02/19/20 1503  BP: (!) 168/73  Pulse: 67  Resp: 18  Temp: 98.3 F (36.8 C)  SpO2: 98%    Filed Weights   02/19/20 1503  Weight: 213 lb 4.8 oz (96.8 kg)   .Body mass index is 38.39 kg/m.   Exam was given in a chair   GENERAL:alert, in no acute distress and comfortable SKIN: no acute rashes, no significant lesions EYES: conjunctiva are pink and non-injected, sclera anicteric OROPHARYNX: MMM, no exudates, no oropharyngeal erythema or ulceration NECK: supple, no JVD LYMPH:  no palpable lymphadenopathy in the cervical, axillary or inguinal regions LUNGS: clear to auscultation b/l with normal respiratory effort HEART: regular rate & rhythm ABDOMEN:  normoactive bowel sounds , non tender, not distended. No palpable hepatosplenomegaly.  Extremity: no pedal edema PSYCH: alert & oriented x 3 with fluent speech NEURO: no focal motor/sensory deficits  LABORATORY DATA:   .[name] Kitchen CBC Latest Ref Rng & Units 02/19/2020 10/15/2019 09/11/2019  WBC 4.0 -  10.5 K/uL 81.4(HH) 79.9(H) 106.8(H)  Hemoglobin 12.0 - 15.0 g/dL 10.5(L) 11.9 11.8  Hematocrit 36.0 - 46.0 % 32.7(L) 35.8 37.0  Platelets 150 - 400 K/uL 66(L) 89(L) 91(L)   . CBC    Component Value Date/Time   WBC 81.4 (HH) 02/19/2020 1430   RBC 3.17 (L) 02/19/2020 1430   HGB 10.5 (L) 02/19/2020 1430   HGB 11.4 (L) 12/13/2018 1440   HGB 11.2 (L) 11/08/2017 1418   HCT 32.7 (L) 02/19/2020 1430   HCT 34.8 11/08/2017 1418   PLT 66 (L) 02/19/2020 1430   PLT 78 (L) 12/13/2018 1440   PLT 140 (L) 11/08/2017 1418   MCV 103.2 (H) 02/19/2020 1430   MCV 96.4 11/08/2017 1418   MCH 33.1 02/19/2020 1430   MCHC 32.1 02/19/2020 1430   RDW 14.5 02/19/2020 1430   RDW 14.8 (H) 11/08/2017 1418   LYMPHSABS 73.8 (H) 02/19/2020 1430   LYMPHSABS 87.0 (H) 11/08/2017 1418   MONOABS 1.6 (H) 02/19/2020 1430   MONOABS 1.5 (H) 11/08/2017 1418   EOSABS 0.3 02/19/2020 1430   EOSABS 0.6 (H) 11/08/2017 1418   BASOSABS 0.0 02/19/2020 1430   BASOSABS 0.2 (H) 11/08/2017 1418      CMP Latest Ref Rng & Units 02/19/2020 10/15/2019 09/11/2019  Glucose 70 - 99 mg/dL 103(H) 94 100(H)  BUN 8 - 23 mg/dL 12 16 21   Creatinine 0.44 - 1.00 mg/dL 0.82 0.73 0.84  Sodium 135 - 145 mmol/L 134(L) 133(L) 133(L)  Potassium 3.5 - 5.1 mmol/L 4.0 3.9 4.0  Chloride 98 - 111 mmol/L 98 96(L) 97(L)  CO2 22 - 32 mmol/L 25 24 27   Calcium 8.9 - 10.3 mg/dL 9.1 9.4 9.6  Total Protein 6.5 - 8.1 g/dL 6.2(L) 6.2 5.8(L)  Total Bilirubin 0.3 - 1.2 mg/dL 0.8 0.9 0.7  Alkaline Phos 38 - 126 U/L 82 - -  AST 15 - 41 U/L 24 25 20   ALT 0 - 44 U/L 16 16 24       RADIOGRAPHIC STUDIES: I have personally reviewed the radiological images as listed and agreed with the findings in the report.  CT ABDOMEN AND PELVIS WITHOUT CONTRAST   IMPRESSION: 1. No acute abnormalities within the abdomen or pelvis. 2. Mild splenomegaly and several prominent to mildly enlarged pelvic and inguinal lymph nodes. These findings are similar to the prior CT. 3.  Small nonobstructing  stone in the lower pole the right kidney. No ureteral stones or obstructive uropathy.   Electronically Signed   By: Lajean Manes M.D.   On: 11/14/2016 16:39    ASSESSMENT & PLAN:   74 y.o.  caucasian female with   1.Rai Stage 2 previously - now Stage IV CLL with lymphocytosis, LNadenopathy and mild splenomegaly with anemia and thrombocytopenia. -patient has previously and continues to desire holding off CLL directed treatment as long as possible.   Lab Results  Component Value Date   LDH 203 (H) 02/19/2020   PLAN: -Discussed pt labwork today, 02/19/20; some anemia, PLT are low, WBC are steady, blood chemistries are stable  -Discussed 02/19/2020 LDH is slightly elevated at 203, no major changes -patient has been offered option to begin treatment of [name] but has chosen to pursue conservative management. -No acute indication to begin treatment at this time, but will watch labs more closely  -Recommend pt wait to have her routine Mammogram until after her breast bruise heals and for two months after her second [place] pt again of proper safety precautions when traveling  -Will see back in 10 weeks with labs, sooner if new concerns   2.  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 Has been taking good infection prevention precautions. CLL initially diagnosed 2009 with 13q deletion. Only intervention thus far has been intermittent IVIG, clinically very helpful with decrease in respiratory infections.  Last imaging was CT AP 02-2015. --showed no evidence of Splenomegaly. Negative Hep B serology 2015  No overt new constitutional symptoms except mild unchanged fatigue. Has had significant recurrent headaches with IVIG - discontinued  3. Mild Anemia Hgb hemoglobin stable in the 10.5-11 range.  4. Moderate thrombocytopenia PLT count at [facility] and has recently been in the 80-90k range.  5. H/o  Recurrent cutaneous SCC s/p Mohs surgery for SCC of the  nosehealed. No issues with infection-   her 13q deletion places her at risk for recurrent SCC. -continue close f/u with dermatologist for evaluation and management of non melanoma skin cancers that can be increased in patient with CLL with 13q deletion.   FOLLOW UP: RTC with Dr Irene Limbo with labs in 10 weeks   The total time spent in the appt was 20 minutes and more than 50% was on counseling and direct patient cares.  All of the patient's questions were answered with apparent satisfaction. The patient knows to call the clinic with any problems, questions or concerns.   Sullivan Lone MD [place] AAHIVMS [facility] Hematology/Oncology Physician [facility]  (Office):       [phone] (Work cell):  [phone] (Fax):           [phone]   I, Yevette Edwards, am acting as a scribe for Dr. Sullivan Lone.   .I have reviewed the above documentation for accuracy and completeness, and I agree with the above. Brunetta Genera MD

## 2020-02-20 ENCOUNTER — Telehealth: Payer: Self-pay | Admitting: Hematology

## 2020-02-20 NOTE — Telephone Encounter (Signed)
Scheduled per 03/17 los, patient has been called and notified.  

## 2020-02-27 ENCOUNTER — Ambulatory Visit: Payer: Medicare Other | Admitting: Physician Assistant

## 2020-03-02 ENCOUNTER — Other Ambulatory Visit: Payer: Self-pay | Admitting: Physician Assistant

## 2020-03-02 DIAGNOSIS — K219 Gastro-esophageal reflux disease without esophagitis: Secondary | ICD-10-CM

## 2020-03-09 NOTE — Progress Notes (Signed)
WELCOME TO MEDICARE VISIT  Assessment and Plan:   Encounter for Medicare annual wellness exam Schedule MGM 1 year  CLL (chronic lymphocytic leukemia) ([place]) Continue follow up Dr. Irene Limbo  Morbid obesity (BMI 37.95) - follow up 3 months for progress monitoring - increase veggies, decrease carbs - long discussion about weight loss, diet, and exercise  Atherosclerosis of aorta (HCC) Control blood pressure, cholesterol, glucose, increase exercise.   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 Irene Limbo  Chronic bronchitis, unspecified chronic bronchitis type ([place]) Monitor, continue to mask, socially distance and hand wash  Thrombocytopenia ([place]) -     COMPLETE METABOLIC PANEL WITH GFR CBC recently checked  Depression, major, in remission ([place]) -     FLUoxetine (PROZAC) 20 MG capsule; Take 1 capsule (20 mg total) by mouth daily. Stop the lexapro, has failed lexapro, wellbutrin, celexa, zoloft ? Would benefit from trintellix? Suggest CBT therapy for anxiety/counseling.   Immunocompromised ([place]) Monitor, continue to mask, socially distance and hand wash  Hypogammaglobulinemia (HCC) Monitor, continue to mask, socially distance and hand wash  Essential hypertension -     COMPLETE METABOLIC PANEL WITH GFR -     TSH - continue medications, DASH diet, exercise and monitor at home. Call if greater than 130/80.   OSA (obstructive sleep apnea) States it is mild, would prefer to work on weight loss than do OSA Try to avoid sleeping supine.   Mixed hyperlipidemia -     Lipid panel check lipids decrease fatty foods increase activity.   Medication management -     Magnesium  Abnormal glucose -     Hemoglobin A1c Discussed disease progression and risks Discussed diet/exercise, weight management and risk modification  Vitamin D deficiency -     VITAMIN D 25 Hydroxy (Vit-D Deficiency, Fractures)  Gastroesophageal reflux disease, unspecified whether esophagitis  present Follow up GI  B12 deficiency -     Vitamin B12  Iron deficiency anemia, unspecified iron deficiency anemia type -     Iron,Total/Total Iron Binding Cap  Right sided abdominal pain -     US Abdomen Complete; Future Increase prilosec to BID, follow up GI for possible EGD, get AB [place] if not better  Continue diet and meds as discussed. Further disposition pending results of labs. OVER 30 minutes of exam, counseling, chart review, referral performed  MEDICARE WELLNESS OBJECTIVES: Physical activity: Current Exercise Habits: The patient does not participate in regular exercise at present, Exercise limited by: orthopedic condition(s) Cardiac risk factors: Cardiac Risk Factors include: advanced age (>68men, >46 women);dyslipidemia;hypertension;obesity (BMI >30kg/m2);sedentary lifestyle Depression/mood screen:   Depression screen [facility] 2/9 03/12/2020  Decreased Interest 1  Down, Depressed, Hopeless 0  PHQ - 2 Score 1  Altered sleeping 0  Tired, decreased energy 1  Change in appetite 0  Feeling bad or failure about yourself  0  Trouble concentrating 1  Moving slowly or fidgety/restless 0  Suicidal thoughts 0  PHQ-9 Score 3  Difficult doing work/chores Somewhat difficult  Some recent data might be hidden    ADLs:  In your present state of health, do you have any difficulty performing the following activities: 03/12/2020 10/20/2019  Hearing? N N  Vision? N N  Difficulty concentrating or making decisions? N N  Walking or climbing stairs? N N  Dressing or bathing? N N  Doing errands, shopping? N N  Some recent data might be hidden     Cognitive Testing  Alert? Yes  Normal Appearance?Yes  Oriented to person? Yes  Place? Yes   Time? Yes  Recall of three objects?  Yes  Can perform simple calculations? Yes  Displays appropriate judgment?Yes  Can read the correct time from a watch face?Yes  EOL planning: Does Patient Have a Medical Advance Directive?: Yes Type of Advance  Directive: Healthcare Power of Attorney, Living will Does patient want to make changes to medical advance directive?: No - Patient declined Copy of [place] in Chart?: No - copy requested  Immunization History  Administered Date(s) Administered  . Influenza, High Dose Seasonal PF 08/29/2017, 08/22/2018, 09/11/2019  . Influenza,inj,Quad PF,6+ Mos 09/15/2014, 08/13/2015  . Influenza-Unspecified 08/31/2013, 09/04/2014  . PFIZER SARS-COV-2 Vaccination 01/16/2020, 02/10/2020  . Pneumococcal Conjugate-13 09/23/2015  . Pneumococcal Polysaccharide-23 08/29/2017  . Pneumococcal-Unspecified 12/28/2010  . Td 07/05/2006, 08/22/2018   Health Maintenance  Topic Date Due  . MAMMOGRAM  06/17/2019  . INFLUENZA VACCINE  07/05/2020  . COLONOSCOPY  11/21/2026  . TETANUS/TDAP  08/22/2028  . DEXA SCAN  Completed  . Hepatitis C Screening  Completed  . PNA vac Low Risk Adult  Completed     Medicare Attestation I have personally reviewed: The patient's medical and social history Their use of alcohol, tobacco or illicit drugs Their current medications and supplements The patient's functional ability including ADLs,fall risks, home safety risks, cognitive, and hearing and visual impairment Diet and physical activities Evidence for depression or mood disorders  The patient's weight, height, BMI, and visual acuity have been recorded in the chart.  I have made referrals, counseling, and provided education to the patient based on review of the above and I have provided the patient with a written personalized care plan for preventive services.     HPI 74 y.o. female  presents for over due 3 month follow up - follows up with hypertension, hyperlipidemia, prediabetes, Vitamin D, and complicated history of CLL.   She is retired Dec, she is still dreaming about work,  Having nightmares. She stresses about money. She does not want to leave the house, she does not want to talk to people or  see people. She has been taking 1/2-1 tablet daily of her xanax that makes her feel better/less anxious. She is on lexapro 10 mg. She has been on wellbutrin in the past without help.   She has back pain, following with Dr. Mina Marble for back pain, going to get injections and start PT.   Pt also fell about in [employer], tripped over her ottoman due to her feet slipping out of her shoes. She has a large bruise above her left hip and on her breast that have heeled but she still has some mild pain at he sites but they are healing .   Patient was dx'd with CLL in Dec 2009 and been monitored by Dr. Irene Limbo. She could not tolerate the IVIG.  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  She is going to follow up with Dr. Melanee Spry. Lab Results  Component Value Date   WBC 81.4 (HH) 02/19/2020   HGB 10.5 (L) 02/19/2020   HCT 32.7 (L) 02/19/2020   MCV 103.2 (H) 02/19/2020   PLT 66 (L) 02/19/2020    She does not workout. She denies chest pain, shortness of breath, dizziness.  She is on cholesterol medication and denies myalgias. Her cholesterol is at goal. The cholesterol last visit was:   Lab Results  Component Value Date   CHOL 179 03/11/2020   HDL 52 03/11/2020   LDLCALC 103 (H) 03/11/2020   TRIG  142 03/11/2020   CHOLHDL 3.4 03/11/2020    She has been working on diet and exercise for prediabetes, and denies paresthesia of the feet, polydipsia, polyuria and visual disturbances. Last A1C in the office was:  Lab Results  Component Value Date   HGBA1C 5.1 03/11/2020   Patient is on Vitamin D supplement.   Lab Results  Component Value Date   VD25OH 66 03/11/2020     BMI is Body mass index is 36.36 kg/m., she is working on diet and exercise.. She has slight OSA, NOT on CPAP.  Wt Readings from Last 3 Encounters:  03/11/20 211 lb 12.8 oz (96.1 kg)  02/19/20 213 lb 4.8 oz (96.8 kg)  11/19/19 208 lb (94.3 kg)   Current Medications:  Current Outpatient Medications on File Prior to Visit  Medication Sig  . ALPRAZolam  (XANAX) 1 MG tablet Take 1/2 - 1 tablet   2 - 3 x /day ONLY if needed for Anxiety Attack &  limit to 5 days /week to avoid Addiction & Dementia  . Ascorbic Acid (VITAMIN C) 1000 MG tablet Take 1,500 mg by mouth daily.   . bisoprolol-hydrochlorothiazide (ZIAC) 10-6.25 MG tablet Take 1 tablet Daily for BP  . cetirizine (ZYRTEC) 10 MG tablet Take 10 mg by mouth daily.  . Cholecalciferol (D 2000) 2000 units TABS Take 10,000 Units by mouth daily.   [name] Kitchen ezetimibe (ZETIA) 10 MG tablet Take 1 tablet Daily for Cholesterol  . fluorometholone (FML) 0.1 % ophthalmic suspension SHAKE LQ AND INT 1 GTT IN OU BID  . gabapentin (NEURONTIN) 300 MG capsule 300 mg 3 (three) times daily.   [name] Kitchen omeprazole (PRILOSEC) 40 MG capsule Take 1 capsule Daily to Prevent Heartburn & Indigestion  . Probiotic Product (PROBIOTIC PO) Take 1 tablet daily by mouth.  . RESTASIS 0.05 % ophthalmic emulsion INT 1 GTT IN OU BID   No current facility-administered medications on file prior to visit.   Medical History:  Past Medical History:  Diagnosis Date  . Anemia   . Anxiety   . Arthritis   . CLL (chronic lymphocytic leukemia) ([place]) dx'd ~ 2015  . Depression   . GERD (gastroesophageal reflux disease)   . Headache    "sinus headaches"  . History of kidney stones   . Hypertension   . OSA (obstructive sleep apnea)   . Pneumonia   . Prediabetes   . Recurrent sinus infections 08/02/2012   Allergies:  Allergies  Allergen Reactions  . Hyzaar [Losartan Potassium-Hctz] Other (See Comments)    "messed up my sodium counts" swells up lips.  [name] [Pseudoephedrine Hcl] Palpitations  . Levaquin [Levofloxacin In D5w] Diarrhea and Nausea Only  . Acyclovir And Related   . Biaxin [Clarithromycin]     GI Upset  . Celexa [Citalopram]   . Flexeril [Cyclobenzaprine]     "zombie-like" feeling  . Fosamax [Alendronate Sodium]     GI upset  . Iohexol Hives     Code: HIVES, Desc: pt states she broke out in hive 20 yrs ago from IV  contrast.     . Losartan     Angioedema  . Meloxicam     GI upset  . Norvasc [Amlodipine] Swelling  . Pseudoephedrine     Palpitations  . Zoloft [Sertraline Hcl] Other (See Comments)    Has no emotions at all     SURGICAL HISTORY She  has a past surgical history that includes Cholecystectomy (1985); Tonsillectomy and adenoidectomy (1950's); Breast surgery; Lymph node  biopsy; Temporomandibular joint arthroplasty (1980's); Functional endoscopic sinus surgery (1990's); Abdominal hysterectomy (1980's); laparoscopic appendectomy (N/A, 06/04/2014); immunoglobulin treatment (2017); Appendectomy; Shoulder arthroscopy (Right); Cataract extraction, bilateral; and Lumbar laminectomy/decompression microdiscectomy (Left, 11/01/2017). FAMILY HISTORY Her family history includes Heart attack in her maternal grandmother; Hypertension in her maternal grandmother and mother; Parkinson's disease in her mother. SOCIAL HISTORY She  reports that she quit smoking about 51 years ago. Her smoking use included cigarettes. She has a 3.00 pack-year smoking history. She has never used smokeless tobacco. She reports current alcohol use of about 7.0 standard drinks of alcohol per week. She reports that she does not use drugs.   Review of Systems:  Review of Systems  Constitutional: Positive for malaise/fatigue. Negative for chills, diaphoresis, fever and weight loss.  HENT: Negative for congestion, ear discharge, ear pain, hearing loss, nosebleeds, sinus pain, sore throat and tinnitus.        + TMJ  Eyes: Negative for blurred vision, double vision, photophobia, pain and discharge.  Respiratory: Negative for cough, hemoptysis, sputum production, wheezing and stridor.   Cardiovascular: Positive for leg swelling (unchanged and at baseline). Negative for chest pain, palpitations, orthopnea, claudication and PND.  Gastrointestinal: Negative for abdominal pain, blood in stool, constipation, diarrhea, heartburn, melena, nausea  and vomiting.  Genitourinary: Negative for dysuria, flank pain, frequency, hematuria and urgency.  Musculoskeletal: Positive for joint pain (left shoulder). Negative for back pain, falls, myalgias and neck pain.  Skin: Negative.   Neurological: Negative for dizziness, tingling, tremors, sensory change, speech change, focal weakness, seizures, loss of consciousness, weakness and headaches.  Endo/Heme/Allergies: Negative for environmental allergies and polydipsia. Does not bruise/bleed easily.  Psychiatric/Behavioral: Negative for depression, hallucinations, memory loss, substance abuse and suicidal ideas. The patient is nervous/anxious. The patient does not have insomnia.     Family history- Review and unchanged Social history- Review and unchanged Physical Exam: BP 130/72   Pulse 62   Temp (!) 97.2 F (36.2 C)   Ht 5\' 4"  (1.626 m)   Wt 211 lb 12.8 oz (96.1 kg)   SpO2 98%   BMI 36.36 kg/m  Wt Readings from Last 3 Encounters:  03/11/20 211 lb 12.8 oz (96.1 kg)  02/19/20 213 lb 4.8 oz (96.8 kg)  11/19/19 208 lb (94.3 kg)   General Appearance: Well nourished, in no apparent distress, no diaphroesis Eyes: PERRLA, EOMs, conjunctiva no swelling or erythema Sinuses:  Frontal/maxillary tenderness ENT/Mouth: Ext aud canals clear, TMs without erythema, bulging. No erythema, swelling, or exudate on post pharynx.  Tonsils not swollen or erythematous. Hearing normal.  Neck: Supple, thyroid normal.  Respiratory: Respiratory effort normal, BS equal bilaterally without rales, rhonchi, wheezing or stridor.  Cardio: RRR with no MRGs. Brisk peripheral pulses with mild edema.  Abdomen: Soft, + BS, obese, diffusely overly tender to palpation,  without rebound, hernias, masses. Lymphatics: Non tender without lymphadenopathy.  Musculoskeletal: Full ROM, 5/5 strength, Normal gait, Strength is normal and symmetric in arms. Skin:  Warm, dry without rashes, lesions, ecchymosis.  Neuro: Cranial nerves  intact. Normal muscle tone, no cerebellar symptoms. Psych: Awake and oriented X 3, normal affect, appears anxious, Insight and Judgment appropriate.    Vicie Mutters, PA-C 9:38 AM [facility]

## 2020-03-11 ENCOUNTER — Ambulatory Visit: Payer: Medicare Other | Admitting: Physician Assistant

## 2020-03-11 ENCOUNTER — Other Ambulatory Visit: Payer: Self-pay

## 2020-03-11 ENCOUNTER — Encounter: Payer: Self-pay | Admitting: Physician Assistant

## 2020-03-11 VITALS — BP 130/72 | HR 62 | Temp 97.2°F | Ht 64.0 in | Wt 211.8 lb

## 2020-03-11 DIAGNOSIS — G4733 Obstructive sleep apnea (adult) (pediatric): Secondary | ICD-10-CM

## 2020-03-11 DIAGNOSIS — F325 Major depressive disorder, single episode, in full remission: Secondary | ICD-10-CM

## 2020-03-11 DIAGNOSIS — I700 Atherosclerosis of aorta: Secondary | ICD-10-CM

## 2020-03-11 DIAGNOSIS — D509 Iron deficiency anemia, unspecified: Secondary | ICD-10-CM | POA: Diagnosis not present

## 2020-03-11 DIAGNOSIS — E782 Mixed hyperlipidemia: Secondary | ICD-10-CM

## 2020-03-11 DIAGNOSIS — I10 Essential (primary) hypertension: Secondary | ICD-10-CM

## 2020-03-11 DIAGNOSIS — Z0001 Encounter for general adult medical examination with abnormal findings: Secondary | ICD-10-CM | POA: Diagnosis not present

## 2020-03-11 DIAGNOSIS — J42 Unspecified chronic bronchitis: Secondary | ICD-10-CM

## 2020-03-11 DIAGNOSIS — D801 Nonfamilial hypogammaglobulinemia: Secondary | ICD-10-CM

## 2020-03-11 DIAGNOSIS — D696 Thrombocytopenia, unspecified: Secondary | ICD-10-CM | POA: Diagnosis not present

## 2020-03-11 DIAGNOSIS — K219 Gastro-esophageal reflux disease without esophagitis: Secondary | ICD-10-CM

## 2020-03-11 DIAGNOSIS — R6889 Other general symptoms and signs: Secondary | ICD-10-CM

## 2020-03-11 DIAGNOSIS — C9110 Chronic lymphocytic leukemia of B-cell type not having achieved remission: Secondary | ICD-10-CM

## 2020-03-11 DIAGNOSIS — E538 Deficiency of other specified B group vitamins: Secondary | ICD-10-CM

## 2020-03-11 DIAGNOSIS — E559 Vitamin D deficiency, unspecified: Secondary | ICD-10-CM

## 2020-03-11 DIAGNOSIS — Z79899 Other long term (current) drug therapy: Secondary | ICD-10-CM

## 2020-03-11 DIAGNOSIS — R109 Unspecified abdominal pain: Secondary | ICD-10-CM

## 2020-03-11 DIAGNOSIS — R7309 Other abnormal glucose: Secondary | ICD-10-CM

## 2020-03-11 DIAGNOSIS — D849 Immunodeficiency, unspecified: Secondary | ICD-10-CM

## 2020-03-11 DIAGNOSIS — Z0000 Encounter for general adult medical examination without abnormal findings: Secondary | ICD-10-CM

## 2020-03-11 MED ORDER — FLUOXETINE HCL 20 MG PO CAPS: 20.0000 mg | ORAL_CAPSULE | Freq: Every day | ORAL | 2 refills | Status: DC

## 2020-03-11 NOTE — Patient Instructions (Addendum)
Check out [employer] Light therapy Lamp, Ultra Thin UV Free 10000 Lux therapy Light on Amazon- 35-40 dollars.     HOW TO SCHEDULE A MAMMOGRAM  [facility]  7 a.m.-6:30 p.m., Monday 7 a.m.-5 p.m., Tuesday-Friday Schedule an appointment by calling [phone].  Stop lexapro and start prozac Make sure you are on your vitamin D  Take omeprazole twice a day for 2 weeks,  And follow up with GI if you keep having an issue. AND GET THE AB [place]  YOU CAN CALL TO MAKE AN ULTRASOUND..  I have put in an order for an ultrasound for you to have You can set them up at your convenience by calling this number [phone] You will likely have the ultrasound at [address]  If you have any issues call our office and we will set this up for you.   Phone: [phone] Dr. Hilarie Fredrickson Avoid alcohol, spicy foods, NSAIDS (aleve, ibuprofen) at this time. See foods below.   Food Choices for Gastroesophageal Reflux Disease When you have gastroesophageal reflux disease (GERD), the foods you eat and your eating habits are very important. Choosing the right foods can help ease the discomfort of GERD. WHAT GENERAL GUIDELINES DO I NEED TO FOLLOW?  Choose fruits, vegetables, whole grains, low-fat dairy products, and low-fat meat, fish, and poultry.  Limit fats such as oils, salad dressings, butter, nuts, and avocado.  Keep a food diary to identify foods that cause symptoms.  Avoid foods that cause reflux. These may be different for different people.  Eat frequent small meals instead of three large meals each day.  Eat your meals slowly, in a relaxed setting.  Limit fried foods.  Cook foods using methods other than frying.  Avoid drinking alcohol.  Avoid drinking large amounts of liquids with your meals.  Avoid bending over or lying down until 2-3 hours after eating. WHAT FOODS ARE NOT RECOMMENDED? The following are some foods and drinks that may worsen your  symptoms: Vegetables Tomatoes. Tomato juice. Tomato and spaghetti sauce. Chili peppers. Onion and garlic. Horseradish. Fruits Oranges, grapefruit, and lemon (fruit and juice). Meats High-fat meats, fish, and poultry. This includes hot dogs, ribs, ham, sausage, salami, and bacon. Dairy Whole milk and chocolate milk. Sour cream. Cream. Butter. Ice cream. Cream cheese.  Beverages Coffee and tea, with or without caffeine. Carbonated beverages or energy drinks. Condiments Hot sauce. Barbecue sauce.  Sweets/Desserts Chocolate and cocoa. Donuts. Peppermint and spearmint. Fats and Oils High-fat foods, including [place] fries and potato chips. Other Vinegar. Strong spices, such as black pepper, white pepper, red pepper, cayenne, curry powder, cloves, ginger, and chili powder.   Counseling services   I suggest calling your insurance and finding out who is in your network and THEN calling those people or looking them up on google.   I'm a big fan of Cognitive Behavioral Therapy, look this up on You tube or check with the therapist you see if they are certified.  This form of therapy helps to teach you skills to better handle with current situation that are causing anxiety or depression.   There are some great apps too Check out [place], give thanks app.  Meditations apps are great like headspace.    Senior resources Tia Alert [phone] Senior resources [place] [phone] Call these places to ask about classes and what you can do for classes     Managing Seasonal Affective Disorder Seasonal affective disorder (SAD) is a type  of depression. A person who has SAD feels depressed at specific times of the year. The most common times for this condition are late fall and winter. How to manage lifestyle changes Managing stress 1. Get 6-8 hours of sleep every night. To improve your sleep, make sure you: ? Keep your bedroom dark and cool. ? Go to sleep and wake up at about the same time every  day. ? Limit screen time starting a few hours before bedtime. 2. Exercise regularly. 3. Avoid making major decisions until you feel better. 4. Practice techniques to manage your stress, such as: ? Mindfulness-based meditation. ? Focused breathing exercises. ? Relaxation exercises. ? Journaling. 5. Find activities that help you relax, like reading, getting a massage, or spending time with a friend. 6. Do mind-body exercises, like yoga or tai chi. Managing treatment    Treatment for SAD includes talk therapy, light therapy, combination of both talk and light therapy, and medicines for depression (antidepressants). If you take medicine for SAD, avoid using alcohol and other substances that may prevent your medicine from working properly. It is also important to:  Talk with your pharmacist or health care provider about all the medicines that you take, their possible side effects, and what medicines are safe to take together.  Ask to be referred to a therapist who can help with SAD.  Use a light box for 30 minutes every morning. This should start in the early fall and continue until spring.  Take part in all treatment decisions (shared decision-making). Give input on the side effects of medicines. Shared decision-making should be part of your total treatment plan.  Know when you can expect to start feeling better. Antidepressants take a while to start working. Let your health care provider know if you are not feeling better after the expected time.  Make sure you know all the side effects of treatment. Know which ones are serious enough to call your health care provider about.   Relationships  Educate your friends and family members about SAD so they can understand how it affects you.  Ask for support from friends and family members to help you manage SAD. Consider asking family members to participate in family therapy.  Consider joining a support group. This can help you express  yourself and learn how others manage SAD. General instructions  Do your best to stay positive by remembering that SAD usually goes away after a few months.  Work with your health care providers on a treatment plan. Stick to your plan. Make a plan that includes routines such as talk therapy and light therapy.  Make your home and work environment sunny or bright. Open window blinds. Spend as much time as possible outside. Move furniture closer to windows for exposure to natural light. How to recognize changes in your condition As your SAD improves, you may:  Start to feel your mood get better.  Have more energy for daily activities.  Get back to your normal routines for sleeping and eating.  Think more clearly and be less agitated.  Feel better about yourself.  Feel joy and interest return. If your SAD gets worse, you may:  Avoid people and activities.  Feel guilty and hopeless.  Not eat well, sleep well, or exercise. Follow these instructions at home:  Use a light box and spend as much time in natural daylight as you can.  Stay busy with activities that get you out of the house. Do activities that you enjoy. Consider making  plans for a daily walk with friends or family.  Eat healthy foods, including plenty of fruits and vegetables. Limit foods that contain a lot of fat or sugar. Do not binge on sweets and other carbs.  Limit alcohol and caffeine as told by your health care provider.  Keep all follow-up visits as told by your health care provider. This is important. Where to find support Talking to others Consider joining an online or in-person support group for people with SAD. You can also talk with:  A therapist.  Your health care provider.  Friends and family.  Online support groups at FuneralShow.uy. Finances 1. Check with your insurance to make sure that treatment for SAD is covered. 2. If you have a problem paying for co-pays or counseling, ask to meet  with a Education officer, museum for help. 3. If your medicines are expensive: ? Use a generic form of the medicine. This may be less expensive than a brand name medicine. ? Check to see whether the manufacturer offers assistance programs to patients who cannot afford their medicines. Where to find more information  American Psychological Association: https://taylor.info/.aspx  American Psychiatric Association: psychiatry.org/patients-families/depression/seasonal-affective-disorder  [employer] of Mental Health: ReserveSpaces.se.shtml Contact a health care provider if:  Your symptoms get worse or do not get better.  You have trouble taking care of yourself.  You are using drugs or alcohol to manage your symptoms.  You have side effects from medicines.  Your symptoms return. Get help right away if:  You have thoughts about hurting yourself or others. If you ever feel like you may hurt yourself or others, or have thoughts about taking your own life, get help right away. You can go to your nearest emergency department or call:  Your local emergency services (911 in the [place]  A suicide crisis helpline, such as the [place] at (256)489-4692. This is open 24 hours a day. Summary  SAD is a type of depression that typically occurs during the fall and winter months when there is less daylight. It is rare but possible for SAD to happen during the summer.  SAD causes symptoms of depression that can range from mild to severe.  Lifestyle changes are an important part of managing SAD. Make a plan that includes routines to help you stay active and focused.  Follow your health care provider's treatment plan, which may include talk therapy, light therapy, or use of medicines.  Get help right away if you feel like you might hurt yourself or others, or have thoughts of taking your own life. Call your  local emergency line (911 in the [place] or the National Suicide Prevention Lifeline at 662-187-5249. This is open 24 hours a day. This information is not intended to replace advice given to you by your health care provider. Make sure you discuss any questions you have with your health care provider. Document Revised: 03/15/2019 Document Reviewed: 02/28/2018 Elsevier Patient Education  [place].

## 2020-03-12 ENCOUNTER — Other Ambulatory Visit: Payer: Self-pay | Admitting: Physician Assistant

## 2020-03-12 ENCOUNTER — Encounter: Payer: Self-pay | Admitting: Physician Assistant

## 2020-03-12 DIAGNOSIS — J42 Unspecified chronic bronchitis: Secondary | ICD-10-CM | POA: Insufficient documentation

## 2020-03-12 DIAGNOSIS — I700 Atherosclerosis of aorta: Secondary | ICD-10-CM | POA: Insufficient documentation

## 2020-03-12 DIAGNOSIS — N3000 Acute cystitis without hematuria: Secondary | ICD-10-CM | POA: Diagnosis not present

## 2020-03-12 LAB — COMPLETE METABOLIC PANEL WITH GFR
AG Ratio: 2.8 (calc) — ABNORMAL HIGH (ref 1.0–2.5)
ALT: 14 U/L (ref 6–29)
AST: 22 U/L (ref 10–35)
Albumin: 4.4 g/dL (ref 3.6–5.1)
Alkaline phosphatase (APISO): 63 U/L (ref 37–153)
BUN: 13 mg/dL (ref 7–25)
CO2: 26 mmol/L (ref 20–32)
Calcium: 9.1 mg/dL (ref 8.6–10.4)
Chloride: 96 mmol/L — ABNORMAL LOW (ref 98–110)
Creat: 0.8 mg/dL (ref 0.60–0.93)
GFR, Est African American: 85 mL/min/{1.73_m2} (ref >=60)
GFR, Est Non African American: 73 mL/min/{1.73_m2} (ref >=60)
Globulin: 1.6 g/dL (calc) — ABNORMAL LOW (ref 1.9–3.7)
Glucose, Bld: 100 mg/dL — ABNORMAL HIGH (ref 65–99)
Potassium: 3.6 mmol/L (ref 3.5–5.3)
Sodium: 133 mmol/L — ABNORMAL LOW (ref 135–146)
Total Bilirubin: 0.9 mg/dL (ref 0.2–1.2)
Total Protein: 6 g/dL — ABNORMAL LOW (ref 6.1–8.1)

## 2020-03-12 LAB — HEMOGLOBIN A1C
Hgb A1c MFr Bld: 5.1 % of total Hgb (ref <5.7)
Mean Plasma Glucose: 100 (calc)
eAG (mmol/L): 5.5 (calc)

## 2020-03-12 LAB — IRON, TOTAL/TOTAL IRON BINDING CAP
%SAT: 24 % (calc) (ref 16–45)
Iron: 96 ug/dL (ref 45–160)
TIBC: 405 mcg/dL (calc) (ref 250–450)

## 2020-03-12 LAB — LIPID PANEL
Cholesterol: 179 mg/dL (ref <200)
HDL: 52 mg/dL (ref >=50)
LDL Cholesterol (Calc): 103 mg/dL (calc) — ABNORMAL HIGH
Non-HDL Cholesterol (Calc): 127 mg/dL (calc) (ref <130)
Total CHOL/HDL Ratio: 3.4 (calc) (ref <5.0)
Triglycerides: 142 mg/dL (ref <150)

## 2020-03-12 LAB — TSH: TSH: 4.74 mIU/L — ABNORMAL HIGH (ref 0.40–4.50)

## 2020-03-12 LAB — MAGNESIUM: Magnesium: 1.3 mg/dL — ABNORMAL LOW (ref 1.5–2.5)

## 2020-03-12 LAB — VITAMIN B12: Vitamin B-12: 307 pg/mL (ref 200–1100)

## 2020-03-12 LAB — VITAMIN D 25 HYDROXY (VIT D DEFICIENCY, FRACTURES): Vit D, 25-Hydroxy: 66 ng/mL (ref 30–100)

## 2020-03-13 LAB — URINALYSIS, ROUTINE W REFLEX MICROSCOPIC
Bacteria, UA: NONE SEEN /HPF
Bilirubin Urine: NEGATIVE
Glucose, UA: NEGATIVE
Hgb urine dipstick: NEGATIVE
Hyaline Cast: NONE SEEN /LPF
Ketones, ur: NEGATIVE
Nitrite: NEGATIVE
Protein, ur: NEGATIVE
RBC / HPF: NONE SEEN /HPF (ref 0–2)
Specific Gravity, Urine: 1.01 (ref 1.001–1.03)
pH: 6.5 (ref 5.0–8.0)

## 2020-03-13 LAB — URINE CULTURE
MICRO NUMBER:: 10343395
SPECIMEN QUALITY:: ADEQUATE

## 2020-03-13 MED ORDER — LEVOTHYROXINE SODIUM 50 MCG PO TABS: 50.0000 ug | ORAL_TABLET | Freq: Every day | ORAL | 11 refills | Status: DC

## 2020-03-13 NOTE — Addendum Note (Signed)
Addended by: Vicie Mutters R on: 03/13/2020 11:20 AM   Modules accepted: Orders

## 2020-03-16 DIAGNOSIS — M961 Postlaminectomy syndrome, not elsewhere classified: Secondary | ICD-10-CM | POA: Insufficient documentation

## 2020-03-17 ENCOUNTER — Other Ambulatory Visit: Payer: Self-pay

## 2020-03-17 DIAGNOSIS — K219 Gastro-esophageal reflux disease without esophagitis: Secondary | ICD-10-CM

## 2020-03-17 MED ORDER — OMEPRAZOLE 40 MG PO CPDR: DELAYED_RELEASE_CAPSULE | ORAL | 3 refills | Status: DC

## 2020-04-01 DIAGNOSIS — M7062 Trochanteric bursitis, left hip: Secondary | ICD-10-CM | POA: Diagnosis not present

## 2020-04-01 DIAGNOSIS — M7061 Trochanteric bursitis, right hip: Secondary | ICD-10-CM | POA: Diagnosis not present

## 2020-04-01 DIAGNOSIS — M5416 Radiculopathy, lumbar region: Secondary | ICD-10-CM | POA: Diagnosis not present

## 2020-04-08 ENCOUNTER — Inpatient Hospital Stay (HOSPITAL_COMMUNITY)
Admission: EM | Admit: 2020-04-08 | Discharge: 2020-04-11 | DRG: 392 | Disposition: A | Discharge status: Home / Self Care | Payer: Medicare Other | Attending: Internal Medicine | Admitting: Internal Medicine

## 2020-04-08 ENCOUNTER — Emergency Department (HOSPITAL_COMMUNITY): Payer: Medicare Other

## 2020-04-08 ENCOUNTER — Other Ambulatory Visit: Payer: Self-pay

## 2020-04-08 ENCOUNTER — Encounter (HOSPITAL_COMMUNITY): Payer: Self-pay | Admitting: Internal Medicine

## 2020-04-08 ENCOUNTER — Telehealth: Payer: Self-pay | Admitting: Physician Assistant

## 2020-04-08 ENCOUNTER — Telehealth: Payer: Self-pay | Admitting: *Deleted

## 2020-04-08 DIAGNOSIS — A0811 Acute gastroenteropathy due to Norwalk agent: Principal | ICD-10-CM

## 2020-04-08 DIAGNOSIS — Z87891 Personal history of nicotine dependence: Secondary | ICD-10-CM

## 2020-04-08 DIAGNOSIS — K219 Gastro-esophageal reflux disease without esophagitis: Secondary | ICD-10-CM | POA: Diagnosis present

## 2020-04-08 DIAGNOSIS — E039 Hypothyroidism, unspecified: Secondary | ICD-10-CM | POA: Diagnosis present

## 2020-04-08 DIAGNOSIS — E782 Mixed hyperlipidemia: Secondary | ICD-10-CM | POA: Diagnosis not present

## 2020-04-08 DIAGNOSIS — Z6837 Body mass index (BMI) 37.0-37.9, adult: Secondary | ICD-10-CM

## 2020-04-08 DIAGNOSIS — I10 Essential (primary) hypertension: Secondary | ICD-10-CM | POA: Diagnosis not present

## 2020-04-08 DIAGNOSIS — Z9049 Acquired absence of other specified parts of digestive tract: Secondary | ICD-10-CM | POA: Diagnosis not present

## 2020-04-08 DIAGNOSIS — Z20822 Contact with and (suspected) exposure to covid-19: Secondary | ICD-10-CM | POA: Diagnosis not present

## 2020-04-08 DIAGNOSIS — R7303 Prediabetes: Secondary | ICD-10-CM | POA: Diagnosis present

## 2020-04-08 DIAGNOSIS — N179 Acute kidney failure, unspecified: Secondary | ICD-10-CM | POA: Diagnosis present

## 2020-04-08 DIAGNOSIS — G4733 Obstructive sleep apnea (adult) (pediatric): Secondary | ICD-10-CM | POA: Diagnosis present

## 2020-04-08 DIAGNOSIS — Z79890 Hormone replacement therapy (postmenopausal): Secondary | ICD-10-CM

## 2020-04-08 DIAGNOSIS — E669 Obesity, unspecified: Secondary | ICD-10-CM | POA: Diagnosis not present

## 2020-04-08 DIAGNOSIS — Z90710 Acquired absence of both cervix and uterus: Secondary | ICD-10-CM | POA: Diagnosis not present

## 2020-04-08 DIAGNOSIS — D696 Thrombocytopenia, unspecified: Secondary | ICD-10-CM | POA: Diagnosis present

## 2020-04-08 DIAGNOSIS — F418 Other specified anxiety disorders: Secondary | ICD-10-CM | POA: Diagnosis present

## 2020-04-08 DIAGNOSIS — R197 Diarrhea, unspecified: Secondary | ICD-10-CM

## 2020-04-08 DIAGNOSIS — E860 Dehydration: Secondary | ICD-10-CM | POA: Diagnosis present

## 2020-04-08 DIAGNOSIS — Z79899 Other long term (current) drug therapy: Secondary | ICD-10-CM

## 2020-04-08 DIAGNOSIS — C9110 Chronic lymphocytic leukemia of B-cell type not having achieved remission: Secondary | ICD-10-CM | POA: Diagnosis present

## 2020-04-08 DIAGNOSIS — R112 Nausea with vomiting, unspecified: Secondary | ICD-10-CM

## 2020-04-08 DIAGNOSIS — R55 Syncope and collapse: Secondary | ICD-10-CM | POA: Diagnosis not present

## 2020-04-08 DIAGNOSIS — K529 Noninfective gastroenteritis and colitis, unspecified: Secondary | ICD-10-CM | POA: Diagnosis not present

## 2020-04-08 HISTORY — DX: Hypocalcemia: E83.51

## 2020-04-08 HISTORY — DX: Acute kidney failure, unspecified: N17.9

## 2020-04-08 LAB — COMPREHENSIVE METABOLIC PANEL
ALT: 26 U/L (ref 0–44)
AST: 37 U/L (ref 15–41)
Albumin: 3.9 g/dL (ref 3.5–5.0)
Alkaline Phosphatase: 59 U/L (ref 38–126)
Anion gap: 13 (ref 5–15)
BUN: 10 mg/dL (ref 8–23)
CO2: 20 mmol/L — ABNORMAL LOW (ref 22–32)
Calcium: 8.5 mg/dL — ABNORMAL LOW (ref 8.9–10.3)
Chloride: 101 mmol/L (ref 98–111)
Creatinine, Ser: 1.1 mg/dL — ABNORMAL HIGH (ref 0.44–1.00)
GFR calc Af Amer: 58 mL/min — ABNORMAL LOW (ref >60)
GFR calc non Af Amer: 50 mL/min — ABNORMAL LOW (ref >60)
Glucose, Bld: 136 mg/dL — ABNORMAL HIGH (ref 70–99)
Potassium: 3.5 mmol/L (ref 3.5–5.1)
Sodium: 134 mmol/L — ABNORMAL LOW (ref 135–145)
Total Bilirubin: 1.3 mg/dL — ABNORMAL HIGH (ref 0.3–1.2)
Total Protein: 6.1 g/dL — ABNORMAL LOW (ref 6.5–8.1)

## 2020-04-08 LAB — CBC
HCT: 37.5 % (ref 36.0–46.0)
Hemoglobin: 12.3 g/dL (ref 12.0–15.0)
MCH: 33.1 pg (ref 26.0–34.0)
MCHC: 32.8 g/dL (ref 30.0–36.0)
MCV: 100.8 fL — ABNORMAL HIGH (ref 80.0–100.0)
Platelets: 115 10*3/uL — ABNORMAL LOW (ref 150–400)
RBC: 3.72 MIL/uL — ABNORMAL LOW (ref 3.87–5.11)
RDW: 13 % (ref 11.5–15.5)
WBC: 61.2 10*3/uL (ref 4.0–10.5)
nRBC: 0 % (ref 0.0–0.2)

## 2020-04-08 LAB — URINALYSIS, ROUTINE W REFLEX MICROSCOPIC
Bilirubin Urine: NEGATIVE
Glucose, UA: NEGATIVE mg/dL
Hgb urine dipstick: NEGATIVE
Ketones, ur: 5 mg/dL — AB
Leukocytes,Ua: NEGATIVE
Nitrite: NEGATIVE
Protein, ur: 30 mg/dL — AB
Specific Gravity, Urine: 1.009 (ref 1.005–1.030)
pH: 6 (ref 5.0–8.0)

## 2020-04-08 LAB — RESPIRATORY PANEL BY RT PCR (FLU A&B, COVID)
Influenza A by PCR: NEGATIVE
Influenza B by PCR: NEGATIVE
SARS Coronavirus 2 by RT PCR: NEGATIVE

## 2020-04-08 LAB — LIPASE, BLOOD: Lipase: 21 U/L (ref 11–51)

## 2020-04-08 LAB — C DIFFICILE QUICK SCREEN W PCR REFLEX
C Diff antigen: NEGATIVE
C Diff interpretation: NOT DETECTED
C Diff toxin: NEGATIVE

## 2020-04-08 LAB — MAGNESIUM: Magnesium: 1.2 mg/dL — ABNORMAL LOW (ref 1.7–2.4)

## 2020-04-08 LAB — PHOSPHORUS: Phosphorus: 2.6 mg/dL (ref 2.5–4.6)

## 2020-04-08 MED ORDER — MORPHINE SULFATE (PF) 4 MG/ML IV SOLN
4.0000 mg | Freq: Once | INTRAVENOUS | Status: AC
  Administered 2020-04-08: 4 mg via INTRAVENOUS
  Filled 2020-04-08: qty 1

## 2020-04-08 MED ORDER — SODIUM CHLORIDE 0.9 % IV BOLUS
1000.0000 mL | Freq: Once | INTRAVENOUS | Status: AC
  Administered 2020-04-08: 1000 mL via INTRAVENOUS

## 2020-04-08 MED ORDER — ONDANSETRON HCL 4 MG PO TABS
4.0000 mg | ORAL_TABLET | Freq: Three times a day (TID) | ORAL | 0 refills | Status: DC | PRN
Start: 2020-04-08 — End: 2020-04-11

## 2020-04-08 MED ORDER — MAGNESIUM SULFATE 4 GM/100ML IV SOLN
4.0000 g | Freq: Once | INTRAVENOUS | Status: AC
  Administered 2020-04-08: 4 g via INTRAVENOUS
  Filled 2020-04-08: qty 100

## 2020-04-08 MED ORDER — ONDANSETRON HCL 4 MG/2ML IJ SOLN
4.0000 mg | Freq: Four times a day (QID) | INTRAMUSCULAR | Status: DC | PRN
  Administered 2020-04-09: 4 mg via INTRAVENOUS
  Filled 2020-04-08: qty 2

## 2020-04-08 MED ORDER — EZETIMIBE 10 MG PO TABS
10.0000 mg | ORAL_TABLET | Freq: Every day | ORAL | Status: DC
  Administered 2020-04-08 – 2020-04-10 (×3): 10 mg via ORAL
  Filled 2020-04-08 (×3): qty 1

## 2020-04-08 MED ORDER — DICYCLOMINE HCL 10 MG PO CAPS: 10.0000 mg | ORAL_CAPSULE | Freq: Three times a day (TID) | ORAL | 0 refills | Status: DC | PRN

## 2020-04-08 MED ORDER — POTASSIUM CHLORIDE IN NACL 20-0.9 MEQ/L-% IV SOLN
INTRAVENOUS | Status: DC
  Filled 2020-04-08 (×5): qty 1000

## 2020-04-08 MED ORDER — OLOPATADINE HCL 0.1 % OP SOLN
1.0000 [drp] | Freq: Two times a day (BID) | OPHTHALMIC | Status: DC
  Administered 2020-04-08 – 2020-04-10 (×5): 1 [drp] via OPHTHALMIC
  Filled 2020-04-08: qty 5

## 2020-04-08 MED ORDER — ACETAMINOPHEN 325 MG PO TABS: 650.0000 mg | ORAL_TABLET | Freq: Four times a day (QID) | ORAL | Status: DC | PRN

## 2020-04-08 MED ORDER — ONDANSETRON HCL 4 MG/2ML IJ SOLN
4.0000 mg | Freq: Once | INTRAMUSCULAR | Status: AC
  Administered 2020-04-08: 4 mg via INTRAVENOUS
  Filled 2020-04-08: qty 2

## 2020-04-08 MED ORDER — HYOSCYAMINE SULFATE 0.125 MG SL SUBL
0.1250 mg | SUBLINGUAL_TABLET | SUBLINGUAL | Status: DC | PRN
  Administered 2020-04-10: 0.125 mg via SUBLINGUAL
  Filled 2020-04-08 (×2): qty 1

## 2020-04-08 MED ORDER — ALPRAZOLAM 0.25 MG PO TABS: 0.5000 mg | ORAL_TABLET | ORAL | Status: DC

## 2020-04-08 MED ORDER — LORATADINE 10 MG PO TABS
10.0000 mg | ORAL_TABLET | Freq: Every day | ORAL | Status: DC
  Administered 2020-04-09 – 2020-04-11 (×3): 10 mg via ORAL
  Filled 2020-04-08 (×3): qty 1

## 2020-04-08 MED ORDER — DICYCLOMINE HCL 10 MG PO CAPS
10.0000 mg | ORAL_CAPSULE | Freq: Three times a day (TID) | ORAL | Status: DC | PRN
  Administered 2020-04-10 – 2020-04-11 (×3): 10 mg via ORAL
  Filled 2020-04-08 (×4): qty 1

## 2020-04-08 MED ORDER — PANTOPRAZOLE SODIUM 40 MG PO TBEC
40.0000 mg | DELAYED_RELEASE_TABLET | Freq: Every day | ORAL | Status: DC
  Administered 2020-04-09 – 2020-04-11 (×3): 40 mg via ORAL
  Filled 2020-04-08 (×3): qty 1

## 2020-04-08 MED ORDER — FLUOXETINE HCL 20 MG PO CAPS
20.0000 mg | ORAL_CAPSULE | Freq: Every day | ORAL | Status: DC
  Administered 2020-04-09 – 2020-04-11 (×3): 20 mg via ORAL
  Filled 2020-04-08 (×4): qty 1

## 2020-04-08 MED ORDER — ONDANSETRON HCL 4 MG PO TABS
4.0000 mg | ORAL_TABLET | Freq: Four times a day (QID) | ORAL | Status: DC | PRN
  Administered 2020-04-10: 4 mg via ORAL
  Filled 2020-04-08: qty 1

## 2020-04-08 MED ORDER — FAMOTIDINE IN NACL 20-0.9 MG/50ML-% IV SOLN
20.0000 mg | Freq: Once | INTRAVENOUS | Status: AC
  Administered 2020-04-09: 20 mg via INTRAVENOUS
  Filled 2020-04-08: qty 50

## 2020-04-08 MED ORDER — HYDROMORPHONE HCL 1 MG/ML IJ SOLN
1.0000 mg | Freq: Once | INTRAMUSCULAR | Status: AC
  Administered 2020-04-08: 1 mg via INTRAVENOUS
  Filled 2020-04-08: qty 1

## 2020-04-08 MED ORDER — LOPERAMIDE HCL 2 MG PO CAPS
2.0000 mg | ORAL_CAPSULE | Freq: Four times a day (QID) | ORAL | 0 refills | Status: DC | PRN
Start: 2020-04-08 — End: 2020-04-11

## 2020-04-08 MED ORDER — LEVOTHYROXINE SODIUM 50 MCG PO TABS
50.0000 ug | ORAL_TABLET | Freq: Every day | ORAL | Status: DC
  Administered 2020-04-09 – 2020-04-11 (×3): 50 ug via ORAL
  Filled 2020-04-08 (×3): qty 1

## 2020-04-08 MED ORDER — SODIUM CHLORIDE 0.9% FLUSH: 3.0000 mL | Freq: Once | INTRAVENOUS | Status: DC

## 2020-04-08 MED ORDER — ACETAMINOPHEN 650 MG RE SUPP: 650.0000 mg | Freq: Four times a day (QID) | RECTAL | Status: DC | PRN

## 2020-04-08 MED ORDER — CYCLOSPORINE 0.05 % OP EMUL
1.0000 [drp] | Freq: Two times a day (BID) | OPHTHALMIC | Status: DC | PRN
  Filled 2020-04-08: qty 1

## 2020-04-08 MED ORDER — FENTANYL CITRATE (PF) 100 MCG/2ML IJ SOLN
25.0000 ug | INTRAMUSCULAR | Status: DC | PRN
  Administered 2020-04-08: 25 ug via INTRAVENOUS
  Filled 2020-04-08: qty 2

## 2020-04-08 NOTE — ED Notes (Signed)
Date and time results received: 04/08/20 1147  (use smartphrase ".now" to insert current time)  Test: wbc  Critical Value: 61.2  Name of Provider Notified: allen   Orders Received? Or Actions Taken?: .

## 2020-04-08 NOTE — ED Notes (Signed)
Pt incontinent of stool. Pt cleaned and repositioned in bed.

## 2020-04-08 NOTE — Telephone Encounter (Signed)
-----   Message from Elenor Quinones, [place] sent at 04/07/2020  1:51 PM EDT ----- Regarding: stomach issues Per patient: bad stomach pains & diarrhea  Would like you to send her something into her pharmacy.   Please & thank you

## 2020-04-08 NOTE — H&P (Signed)
History and Physical    Rebecca Bond [id number] DOB: Mar 04, 1946 DOA: 04/08/2020  PCP: [name], MD  Patient coming from: Home.  I have personally briefly reviewed patient's old medical records in [place]  Chief Complaint: Vomiting and diarrhea.  HPI: Rebecca Bond is a 74 y.o. female with medical history significant of anemia, anxiety/depression, osteoarthritis, CLL, GERD, history of sinus headaches and recurrent sinus infections, history of urolithiasis, hypertension, OSA, history of pneumonia, prediabetes who is coming to the emergency department due to multiple daily episodes of loose stools since Sunday associated with 2 episodes of emesis and abdominal pain.  She denies constipation, melena or hematochezia.  No dysuria, frequency hematuria.  She denies fever, but complains of chills and fatigue.  Her appetite is decreased.  No rhinorrhea, sore throat, wheezing or hemoptysis.  She denies chest pain, palpitations, diaphoresis, PND, orthopnea, but complains of lightheadedness earlier.  She occasionally gets lower extremity edema.  She denies polyuria, polydipsia, polyphagia or blurred vision.  ED Course: Initial vital signs were temperature 97.9 F, pulse 72, respirations 15, blood pressure 117/61 mmHg and O2 sat 97% on room air.  In the ED she received 2000 mL of NS bolus, 4 mg of ondansetron IV, 4 mg of morphine IV and most recently hydromorphone 1 mg IV.  The patient tried to go home, but felt very weak and nearly fell when trying to ambulate.  Urinalysis was hazy in appearance with ketonuria of 5 and proteinuria 30 mg/dL.  Rare bacteria on microscopic exam.  Her CBC showed a white count of 61.2, hemoglobin 12.3 g/dL and platelets 215.  Lipase was 21.  Sodium is 134, potassium 3.5, chloride 101 and CO2 20 mmol/L with a normal anion gap.  Glucose 136, BUN 10 and creatinine 1.10 mg/dL.  Most recent creatinine level was 0.8 mg/dL.  LFTs showed total protein of 6.1 g/dL  and total bilirubin of 1.3 mg/dL.  All other values are within normal range.  C. difficile antigen and C. difficile toxin and stool were negative. Chest radiograph did not show any acute abnormalities.  Review of Systems: As per HPI otherwise all other systems reviewed and are negative.  Past Medical History:  Diagnosis Date  . Anemia   . Anxiety   . Arthritis   . CLL (chronic lymphocytic leukemia) ([place]) dx'd ~ 2015  . Depression   . GERD (gastroesophageal reflux disease)   . Headache    "sinus headaches"  . History of kidney stones   . Hypertension   . OSA (obstructive sleep apnea)   . Pneumonia   . Prediabetes   . Recurrent sinus infections 08/02/2012    Past Surgical History:  Procedure Laterality Date  . ABDOMINAL HYSTERECTOMY  1980's   "endometrosis"  . APPENDECTOMY    . BREAST SURGERY    . CATARACT EXTRACTION, BILATERAL    . CHOLECYSTECTOMY  1985  . FUNCTIONAL ENDOSCOPIC SINUS SURGERY  1990's   "cause I kept having sinus infections"  . immunoglobulin treatment  2017  . LAPAROSCOPIC APPENDECTOMY N/A 06/04/2014   Procedure: APPENDECTOMY LAPAROSCOPIC;  Surgeon: Zenovia Jarred, MD;  Location: [facility];  Service: General;  Laterality: N/A;  . LUMBAR LAMINECTOMY/DECOMPRESSION MICRODISCECTOMY Left 11/01/2017   Procedure: Left Lumbar Four-Five Extraforaminal Microdiscectomy;  Surgeon: Eustace Moore, MD;  Location: [facility];  Service: Neurosurgery;  Laterality: Left;  . LYMPH NODE BIOPSY     "determined I had CLL"  . SHOULDER ARTHROSCOPY Right   . TEMPOROMANDIBULAR JOINT ARTHROPLASTY  1980's  . TONSILLECTOMY AND ADENOIDECTOMY  1950's    Social History  reports that she quit smoking about 51 years ago. Her smoking use included cigarettes. She has a 3.00 pack-year smoking history. She has never used smokeless tobacco. She reports current alcohol use of about 7.0 standard drinks of alcohol per week. She reports that she does not use drugs.  Allergies  Allergen Reactions  . Hyzaar  [Losartan Potassium-Hctz] Other (See Comments)    "messed up my sodium counts" swells up lips.  [name] [Pseudoephedrine Hcl] Palpitations  . Levaquin [Levofloxacin In D5w] Diarrhea and Nausea Only  . Acyclovir And Related   . Biaxin [Clarithromycin]     GI Upset  . Celexa [Citalopram]   . Flexeril [Cyclobenzaprine]     "zombie-like" feeling  . Fosamax [Alendronate Sodium]     GI upset  . Iohexol Hives     Code: HIVES, Desc: pt states she broke out in hive 20 yrs ago from IV contrast.     . Losartan     Angioedema  . Meloxicam     GI upset  . Norvasc [Amlodipine] Swelling  . Pseudoephedrine     Palpitations  . Zoloft [Sertraline Hcl] Other (See Comments)    Has no emotions at all     Family History  Problem Relation Age of Onset  . Parkinson's disease Mother   . Hypertension Mother   . Hypertension Maternal Grandmother   . Heart attack Maternal Grandmother        Mild   Prior to Admission medications   Medication Sig Start Date End Date Taking? Authorizing Provider  ALPRAZolam Duanne Moron) 1 MG tablet Take 1/2 - 1 tablet   2 - 3 x /day ONLY if needed for Anxiety Attack &  limit to 5 days /week to avoid Addiction & Dementia 02/06/20   [name], MD  Ascorbic Acid (VITAMIN C) 1000 MG tablet Take 1,500 mg by mouth daily.     [provider]  bisoprolol-hydrochlorothiazide [facility]) 10-6.25 MG tablet Take 1 tablet Daily for BP 06/24/19   [name], MD  cetirizine (ZYRTEC) 10 MG tablet Take 10 mg by mouth daily.    [provider]  Cholecalciferol (D 2000) 2000 units TABS Take 10,000 Units by mouth daily.     [provider]  dicyclomine (BENTYL) 10 MG capsule Take 1 capsule (10 mg total) by mouth 3 (three) times daily as needed for spasms (diarrhea). 04/08/20   Vicie Mutters, PA-C  ezetimibe (ZETIA) 10 MG tablet Take 1 tablet Daily for Cholesterol 05/29/19   [name], MD  fluorometholone (FML) 0.1 % ophthalmic suspension SHAKE LQ AND INT 1  GTT IN OU BID 03/05/19   [provider]  FLUoxetine (PROZAC) 20 MG capsule Take 1 capsule (20 mg total) by mouth daily. 03/11/20 03/11/21  Vicie Mutters, PA-C  gabapentin (NEURONTIN) 300 MG capsule 300 mg 3 (three) times daily.  08/28/19   [provider]  levothyroxine (SYNTHROID) 50 MCG tablet Take 1 tablet (50 mcg total) by mouth daily. 03/13/20 03/13/21  Vicie Mutters, PA-C  loperamide (IMODIUM) 2 MG capsule Take 1 capsule (2 mg total) by mouth 4 (four) times daily as needed for diarrhea or loose stools. 04/08/20   Domenic Moras, PA-C  omeprazole (PRILOSEC) 40 MG capsule Take 1 capsule Daily twice daily Prevent Heartburn & Indigestion 03/17/20   Vicie Mutters, PA-C  ondansetron (ZOFRAN) 4 MG tablet Take 1 tablet (4 mg total) by mouth every 8 (eight) hours  as needed for nausea or vomiting. 04/08/20   Domenic Moras, PA-C  Probiotic Product (PROBIOTIC PO) Take 1 tablet daily by mouth.    [provider]  RESTASIS 0.05 % ophthalmic emulsion INT 1 GTT IN OU BID 08/19/19   [provider]    Physical Exam: Vitals:   04/08/20 1834 04/08/20 1848 04/08/20 1908 04/08/20 1930  BP: (!) 111/49 (!) 108/48 (!) 108/40 102/64  Pulse: (!) 54 (!) 57 (!) 58 (!) 56  Resp:    (!) 22  Temp:      TempSrc:      SpO2: 98% 100% 95% 97%  Weight:      Height:        Constitutional: NAD, calm, comfortable Eyes: PERRL, lids and conjunctivae normal ENMT: Mucous membranes are dry. Posterior pharynx clear of any exudate or lesions. Neck: normal, supple, no masses, no thyromegaly Respiratory: Decreased breath sounds in bases, otherwise clear to auscultation bilaterally, no wheezing, no crackles. Normal respiratory effort. No accessory muscle use.  Cardiovascular: Bradycardic in mid to high 50s, no murmurs / rubs / gallops. No extremity edema. 2+ pedal pulses. No carotid bruits.  Abdomen: Obese, nondistended.  BS positive.  Soft, mild diffuse tenderness, no guarding or rebound, no masses  palpated. No hepatosplenomegaly. Musculoskeletal: no clubbing / cyanosis. Good ROM, no contractures. Normal muscle tone.  Skin: no clinically significant rashes, lesions, ulcers on limited dermatological examination. Neurologic: CN 2-12 grossly intact. Sensation intact, DTR normal. Strength 5/5 in all 4.  Psychiatric: Normal judgment and insight. Alert and oriented x 3. Normal mood.   Labs on Admission: I have personally reviewed following labs and imaging studies  CBC: Recent Labs  Lab 04/08/20 1100  WBC 61.2*  HGB 12.3  HCT 37.5  MCV 100.8*  PLT 115*    Basic Metabolic Panel: Recent Labs  Lab 04/08/20 1100  NA 134*  K 3.5  CL 101  CO2 20*  GLUCOSE 136*  BUN 10  CREATININE 1.10*  CALCIUM 8.5*    GFR: Estimated Creatinine Clearance: 51.3 mL/min (A) (by C-G formula based on SCr of 1.1 mg/dL (H)).  Liver Function Tests: Recent Labs  Lab 04/08/20 1100  AST 37  ALT 26  ALKPHOS 59  BILITOT 1.3*  PROT 6.1*  ALBUMIN 3.9    Urine analysis:    Component Value Date/Time   COLORURINE YELLOW 04/08/2020 1515   APPEARANCEUR HAZY (A) 04/08/2020 1515   LABSPEC 1.009 04/08/2020 1515   LABSPEC 1.010 10/08/2015 1610   PHURINE 6.0 04/08/2020 1515   GLUCOSEU NEGATIVE 04/08/2020 1515   GLUCOSEU Negative 10/08/2015 1610   HGBUR NEGATIVE 04/08/2020 [place] 04/08/2020 1515   BILIRUBINUR Negative 10/08/2015 1610   KETONESUR 5 (A) 04/08/2020 1515   PROTEINUR 30 (A) 04/08/2020 1515   UROBILINOGEN 0.2 10/08/2015 1610   NITRITE NEGATIVE 04/08/2020 1515   LEUKOCYTESUR NEGATIVE 04/08/2020 1515   LEUKOCYTESUR Small 10/08/2015 1610    Radiological Exams on Admission: CT ABDOMEN PELVIS WO CONTRAST  Result Date: 04/08/2020 CLINICAL DATA:  Abdominal pain with diarrhea EXAM: CT ABDOMEN AND PELVIS WITHOUT CONTRAST TECHNIQUE: Multidetector CT imaging of the abdomen and pelvis was performed following the standard protocol without IV contrast. COMPARISON:  2017  FINDINGS: Lower chest: Mild bibasilar atelectasis. Hepatobiliary: Unchanged small cyst of the right hepatic lobe. Post cholecystectomy. No unexpected biliary dilatation. Pancreas: Unremarkable. Spleen: Unremarkable. Adrenals/Urinary Tract: Adrenals and kidneys are unremarkable. The bladder is minimally distended but unremarkable. Stomach/Bowel: Stomach is within normal limits. The bowel  is normal in caliber. Appendix is not visualized and probably surgically absent. Vascular/Lymphatic: Minor aortic atherosclerosis. No enlarged lymph nodes identified. Reproductive: Status post hysterectomy. No adnexal masses. Other: No ascites. Musculoskeletal: Degenerative changes of the lumbar spine. IMPRESSION: No acute abnormality or findings to account for reported symptoms. Electronically Signed   By: Macy Mis M.D.   On: 04/08/2020 16:31   EKG: Independently reviewed.  Vent. rate 72 BPM PR interval 112 ms QRS duration 86 ms QT/QTc 432/473 ms P-R-T axes -40 6 37 Unusual P axis, possible ectopic atrial rhythm Cannot rule out Anterior infarct , age undetermined Abnormal ECG  Assessment/Plan Principal Problem:   Acute gastroenteritis Observation/telemetry. Clear liquids diet. Continue IV fluids. Analgesics as needed. Famotidine 20 mg IVPB x1. Check GI pathogen PCR.  Active Problems:   AKI (acute kidney injury) ([place]) Continue IV hydration. Monitor intake and output. Follow-up renal function and electrolytes in the morning.    Hypocalcemia Will correct hypomagnesemia first. Phosphorus level is normal. Repeat calcium level in a.m. Further work-up pending morning results.    Hypomagnesemia Correcting. Follow-up magnesium level as needed.    GERD (gastroesophageal reflux disease) Continue PPI    CLL (chronic lymphocytic leukemia) (H[facility]) Follow-up WBC. Continue scheduled follow-ups with hematology/oncology    OSA (obstructive sleep apnea) Better with weight loss. Not on CPAP. Continue  supplemental oxygen.    HTN (hypertension) Hold bisoprolol and hydrochlorothiazide. Monitor blood pressure, renal function electrolytes.    Mixed hyperlipidemia Continue statin 10 mg p.o. bedtime.    Thrombocytopenia ([facility]) Monitor platelet count.    Anxiety with depression Continue fluoxetine 10 mg p.o. daily. Continue alprazolam as needed.    Hyperbilirubinemia Likely due to volume depletion. Follow-up bilirubin level in a.m.   DVT prophylaxis: SCDs. Code Status:   Full code. Family Communication:  Her husband was present in the ED room. Disposition Plan:   Patient is from:  Home.  Anticipated DC to:  Home.  Anticipated DC date:  04/09/2020.  Anticipated DC barriers: Clinical improvement. Consults called: Admission status:  Observation/telemetry.  Severity of Illness:  Moderate.Reubin Milan MD [facility]  How to contact the [facility] Attending or Consulting provider [place] or covering provider during after hours [place], for this patient?   1. Check the care team in [facility] and look for a) attending/consulting [facility] provider listed and b) the [facility] team listed 2. Log into [email or web address] and use [facility]'s universal password to access. If you do not have the password, please contact the hospital operator. 3. Locate the [facility] provider you are looking for under [facility] and page to a number that you can be directly reached. 4. If you still have difficulty reaching the provider, please page the [facility] (Director on Call) for the Hospitalists listed on amion for assistance.  04/08/2020, 7:40 PM   This document was prepared using Dragon voice recognition software and may contain some unintended transcription errors.

## 2020-04-08 NOTE — Telephone Encounter (Signed)
Patient was suppose to set up an appointment with GI, please see if she has done this and if not, suggest she do this. We discussed this last visit. k Stop the vitamin C that she is on.  Monitor foods she is on.  Will send in dicyclomine for her to take as needed.  Call the office or go to the ER if bloody stools, persistent diarrhea, not peeing regularly, unable to take oral fluids, vomiting, high fever, severe weakness, abdominal pain or failure to improve in 3 days.

## 2020-04-08 NOTE — ED Notes (Signed)
Provider made aware of pts BP before DC provider states shes stable to be dc. Patient given discharge instructions patient verbalizes understanding.

## 2020-04-08 NOTE — ED Triage Notes (Signed)
Pt arrives pov with reports of diarrhea and abdominal pain since Sunday. Pt states today she had a near syncopal event but her husband was able to grab her and she did not fall.

## 2020-04-08 NOTE — ED Notes (Signed)
Cleaned pt up and readjusted her in the bed.

## 2020-04-08 NOTE — ED Provider Notes (Signed)
Medical screening examination/treatment/procedure(s) were conducted as a shared visit with non-physician practitioner(s) and myself.  I personally evaluated the patient during the encounter.  EKG Interpretation  Date/Time:  Wednesday Apr 08 2020 10:39:17 EDT Ventricular Rate:  72 PR Interval:  112 QRS Duration: 86 QT Interval:  432 QTC Calculation: 473 R Axis:   6 Text Interpretation: Unusual P axis, possible ectopic atrial rhythm Cannot rule out Anterior infarct , age undetermined Abnormal ECG naclt Confirmed by Lacretia Leigh [phone]) on 04/08/2020 2:104:60 PM  74 year old female who presents with epigastric and right upper quadrant pain that radiates down to her right lower quadrant times a month.  No association with food.  On exam, she has mild tenderness without peritoneal signs.  Awaiting abdominal CT   Lacretia Leigh, MD 04/08/20 1506

## 2020-04-08 NOTE — ED Notes (Signed)
RN spoke to [employer] lab, stool sample sent for C-Diff test can't be used for the GI panel that is ordered. Unable to collect new stool specimen at this time.

## 2020-04-08 NOTE — Telephone Encounter (Signed)
Patient is in the hospital at this time, she states she could not take the diarrhea anymore. The hospital gave her something for pain & she may also get admitted into the hospital. She will follow up with GI & with our office once she is out of the hospital.

## 2020-04-08 NOTE — ED Notes (Signed)
Pt O2 saturation dropped to 86% on room air. Pt placed on 2L [place].

## 2020-04-08 NOTE — Telephone Encounter (Signed)
  Patient called and reported she has been having brown, liquid stools for 4 days. She states she has been taking Imodium and Hyoscyaime every 4 hours since the onset of diarrhea, with no relief. She has had no solid food and when she drinks, she has a liquid stool immediately. Per Dr Melford Aase, the patient was advised to go to the ED, since she is probable dehydrated. Patient agreed to go.

## 2020-04-08 NOTE — ED Provider Notes (Addendum)
[facility] EMERGENCY DEPARTMENT Provider Note   CSN: [id number] Arrival date & time: 04/08/20  1032     History Chief Complaint  Patient presents with  . Abdominal Pain  . Diarrhea  . Near Syncope    NETTA BRETZ is a 74 y.o. female.  The history is provided by the patient and medical records. No language interpreter was used.  Abdominal Pain Associated symptoms: diarrhea   Diarrhea Associated symptoms: abdominal pain   Near Syncope Associated symptoms include abdominal pain.     74 year old female with history of CLL for the past 7 years currently receiving treatment, history of IBS, anxiety, presenting to ED for evaluation abdominal pain.  For the past 3 days patient endorses a constant abdominal pain.  She described as a cramping sensation that starts on her right abdomen, goes across her abdomen and towards her flank, and described as a tightness, there is waxing and waning.  She also endorsed nausea, and vomited once.  She endorsed persistent nonbloody nonmucousy diarrhea mildly improved with taking Imodium and Bentyl.  She clearly shortness of fluid, having able to eat much solid food.  She denies having fever or chills no chest pain shortness of breath or productive cough.  She has had prior cholecystectomy, appendectomy, and hysterectomy.  She has had a colonoscopy 3 years ago with some benign polyps.  She denies any recent antibiotic use, no recent travel or eating exotic food.  She mentioned history of IBS but states this is much worse.    Past Medical History:  Diagnosis Date  . Anemia   . Anxiety   . Arthritis   . CLL (chronic lymphocytic leukemia) ([place]) dx'd ~ 2015  . Depression   . GERD (gastroesophageal reflux disease)   . Headache    "sinus headaches"  . History of kidney stones   . Hypertension   . OSA (obstructive sleep apnea)   . Pneumonia   . Prediabetes   . Recurrent sinus infections 08/02/2012    Patient Active Problem List     Diagnosis Date Noted  . Chronic bronchitis, unspecified chronic bronchitis type ([place]) 03/12/2020  . Atherosclerosis of aorta ([place]) 03/12/2020  . 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 Counseling regarding advance care planning and goals of care 12/16/2018  . S/P lumbar laminectomy 11/01/2017  . Thrombocytopenia ([place]) 05/02/2016  . Immunocompromised ([place]) 10/10/2015  . BMI 37.85,   adult 09/23/2015  . Environmental and seasonal allergies 08/15/2015  . Mixed hyperlipidemia 06/01/2015  . Medication management 06/01/2015  . 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 Vitamin D deficiency 09/30/2014  . HTN (hypertension) 01/08/2014  . Abnormal glucose   . Depression, major, in remission ([place])   . GERD (gastroesophageal reflux disease)   . OSA (obstructive sleep apnea)   . Angioedema secondary to ACE/ARB 12/25/2013  . Morbid obesity (BMI 37.95) 12/17/2013  . Hepatitis A 08/02/2012  . Recurrent sinus infections 08/02/2012  . CLL (chronic lymphocytic leukemia) ([place]) 11/22/2011    Past Surgical History:  Procedure Laterality Date  . ABDOMINAL HYSTERECTOMY  1980's   "endometrosis"  . APPENDECTOMY    . BREAST SURGERY    . CATARACT EXTRACTION, BILATERAL    . CHOLECYSTECTOMY  1985  . FUNCTIONAL ENDOSCOPIC SINUS SURGERY  1990's   "cause I kept having sinus infections"  . immunoglobulin treatment  2017  . LAPAROSCOPIC APPENDECTOMY N/A 06/04/2014   Procedure: APPENDECTOMY LAPAROSCOPIC;  Surgeon: Zenovia Jarred, MD;  Location: [facility];  Service: General;  Laterality:  N/A;  . LUMBAR LAMINECTOMY/DECOMPRESSION MICRODISCECTOMY Left 11/01/2017   Procedure: Left Lumbar Four-Five Extraforaminal Microdiscectomy;  Surgeon: Eustace Moore, MD;  Location: [facility];  Service: Neurosurgery;  Laterality: Left;  . LYMPH NODE BIOPSY     "determined I had CLL"  . SHOULDER ARTHROSCOPY Right   . TEMPOROMANDIBULAR JOINT ARTHROPLASTY  1980's  . TONSILLECTOMY AND ADENOIDECTOMY  1950's     OB History   No  obstetric history on file.     Family History  Problem Relation Age of Onset  . Parkinson's disease Mother   . Hypertension Mother   . Hypertension Maternal Grandmother   . Heart attack Maternal Grandmother        Mild    Social History   Tobacco Use  . Smoking status: Former Smoker    Packs/day: 0.75    Years: 4.00    Pack years: 3.00    Types: Cigarettes    Quit date: 12/05/1968    Years since quitting: 51.3  . Smokeless tobacco: Never Used  Substance Use Topics  . Alcohol use: Yes    Alcohol/week: 7.0 standard drinks    Types: 7 Glasses of wine per week  . Drug use: No    Home Medications Prior to Admission medications   Medication Sig Start Date End Date Taking? Authorizing Provider  ALPRAZolam Duanne Moron) 1 MG tablet Take 1/2 - 1 tablet   2 - 3 x /day ONLY if needed for Anxiety Attack &  limit to 5 days /week to avoid Addiction & Dementia 02/06/20   [name], MD  Ascorbic Acid (VITAMIN C) 1000 MG tablet Take 1,500 mg by mouth daily.     [provider]  bisoprolol-hydrochlorothiazide [facility]) 10-6.25 MG tablet Take 1 tablet Daily for BP 06/24/19   [name], MD  cetirizine (ZYRTEC) 10 MG tablet Take 10 mg by mouth daily.    [provider]  Cholecalciferol (D 2000) 2000 units TABS Take 10,000 Units by mouth daily.     [provider]  dicyclomine (BENTYL) 10 MG capsule Take 1 capsule (10 mg total) by mouth 3 (three) times daily as needed for spasms (diarrhea). 04/08/20   Vicie Mutters, PA-C  ezetimibe (ZETIA) 10 MG tablet Take 1 tablet Daily for Cholesterol 05/29/19   [name], MD  fluorometholone (FML) 0.1 % ophthalmic suspension SHAKE LQ AND INT 1 GTT IN OU BID 03/05/19   [provider]  FLUoxetine (PROZAC) 20 MG capsule Take 1 capsule (20 mg total) by mouth daily. 03/11/20 03/11/21  Vicie Mutters, PA-C  gabapentin (NEURONTIN) 300 MG capsule 300 mg 3 (three) times daily.  08/28/19   [provider]  levothyroxine  (SYNTHROID) 50 MCG tablet Take 1 tablet (50 mcg total) by mouth daily. 03/13/20 03/13/21  Vicie Mutters, PA-C  omeprazole (PRILOSEC) 40 MG capsule Take 1 capsule Daily twice daily Prevent Heartburn & Indigestion 03/17/20   Vicie Mutters, PA-C  Probiotic Product (PROBIOTIC PO) Take 1 tablet daily by mouth.    [provider]  RESTASIS 0.05 % ophthalmic emulsion INT 1 GTT IN OU BID 08/19/19   [provider]    Allergies    Hyzaar [losartan potassium-hctz], Sudafed [pseudoephedrine hcl], Levaquin [levofloxacin in d5w], Acyclovir and related, Biaxin [clarithromycin], Celexa [citalopram], Flexeril [cyclobenzaprine], Fosamax [alendronate sodium], Iohexol, Losartan, Meloxicam, Norvasc [amlodipine], Pseudoephedrine, and Zoloft [sertraline hcl]  Review of Systems   Review of Systems  Cardiovascular: Positive for near-syncope.  Gastrointestinal: Positive for abdominal pain and diarrhea.  All other systems  reviewed and are negative.   Physical Exam Updated Vital Signs BP 140/64 (BP Location: Right Arm)   Pulse 62   Temp 97.6 F (36.4 C) (Oral)   Resp 20   Ht 5\' 4"  (1.626 m)   Wt 96.1 kg   SpO2 98%   BMI 36.37 kg/m   Physical Exam Vitals and nursing note reviewed.  Constitutional:      General: She is not in acute distress.    Appearance: She is well-developed. She is obese.  HENT:     Head: Atraumatic.  Eyes:     Conjunctiva/sclera: Conjunctivae normal.  Cardiovascular:     Rate and Rhythm: Normal rate and regular rhythm.  Pulmonary:     Effort: Pulmonary effort is normal.     Breath sounds: Normal breath sounds.  Abdominal:     General: Abdomen is protuberant. Bowel sounds are normal.     Palpations: Abdomen is soft.     Tenderness: There is generalized abdominal tenderness (Diffuse abdominal tenderness without guarding or rebound tenderness.).  Musculoskeletal:     Cervical back: Neck supple.  Skin:    Findings: No rash.  Neurological:     Mental Status:  She is alert.     ED Results / Procedures / Treatments   Labs (all labs ordered are listed, but only abnormal results are displayed) Labs Reviewed  CBC - Abnormal; Notable for the following components:      Result Value   WBC 61.2 (*)    RBC 3.72 (*)    MCV 100.8 (*)    Platelets 115 (*)    All other components within normal limits  URINALYSIS, ROUTINE W REFLEX MICROSCOPIC - Abnormal; Notable for the following components:   APPearance HAZY (*)    Ketones, ur 5 (*)    Protein, ur 30 (*)    Bacteria, UA RARE (*)    All other components within normal limits  COMPREHENSIVE METABOLIC PANEL - Abnormal; Notable for the following components:   Sodium 134 (*)    CO2 20 (*)    Glucose, Bld 136 (*)    Creatinine, Ser 1.10 (*)    Calcium 8.5 (*)    Total Protein 6.1 (*)    Total Bilirubin 1.3 (*)    GFR calc non Af Amer 50 (*)    GFR calc Af Amer 58 (*)    All other components within normal limits  C DIFFICILE QUICK SCREEN W PCR REFLEX  RESPIRATORY PANEL BY RT PCR (FLU A&B, COVID)  LIPASE, BLOOD    EKG EKG Interpretation  Date/Time:  Wednesday Apr 08 2020 10:39:17 EDT Ventricular Rate:  72 PR Interval:  112 QRS Duration: 86 QT Interval:  432 QTC Calculation: 473 R Axis:   6 Text Interpretation: Unusual P axis, possible ectopic atrial rhythm Cannot rule out Anterior infarct , age undetermined Abnormal ECG naclt Confirmed by Lacretia Leigh ([id number]) on 04/08/2020 2:42:31 PM   Radiology CT ABDOMEN PELVIS WO CONTRAST  Result Date: 04/08/2020 CLINICAL DATA:  Abdominal pain with diarrhea EXAM: CT ABDOMEN AND PELVIS WITHOUT CONTRAST TECHNIQUE: Multidetector CT imaging of the abdomen and pelvis was performed following the standard protocol without IV contrast. COMPARISON:  2017 FINDINGS: Lower chest: Mild bibasilar atelectasis. Hepatobiliary: Unchanged small cyst of the right hepatic lobe. Post cholecystectomy. No unexpected biliary dilatation. Pancreas: Unremarkable. Spleen:  Unremarkable. Adrenals/Urinary Tract: Adrenals and kidneys are unremarkable. The bladder is minimally distended but unremarkable. Stomach/Bowel: Stomach is within normal limits. The bowel is normal in caliber.  Appendix is not visualized and probably surgically absent. Vascular/Lymphatic: Minor aortic atherosclerosis. No enlarged lymph nodes identified. Reproductive: Status post hysterectomy. No adnexal masses. Other: No ascites. Musculoskeletal: Degenerative changes of the lumbar spine. IMPRESSION: No acute abnormality or findings to account for reported symptoms. Electronically Signed   By: Macy Mis M.D.   On: 04/08/2020 16:31    Procedures Procedures (including critical care time)  Medications Ordered in ED Medications  sodium chloride flush (NS) 0.9 % injection 3 mL (3 mLs Intravenous Not Given 04/08/20 1345)  sodium chloride 0.9 % bolus 1,000 mL (0 mLs Intravenous Stopped 04/08/20 1518)  morphine 4 MG/ML injection 4 mg (4 mg Intravenous Given 04/08/20 1343)  ondansetron (ZOFRAN) injection 4 mg (4 mg Intravenous Given 04/08/20 1343)  sodium chloride 0.9 % bolus 1,000 mL (0 mLs Intravenous Stopped 04/08/20 1839)  HYDROmorphone (DILAUDID) injection 1 mg (1 mg Intravenous Given 04/08/20 1612)    ED Course  I have reviewed the triage vital signs and the nursing notes.  Pertinent labs & imaging results that were available during my care of the patient were reviewed by me and considered in my medical decision making (see chart for details).    MDM Rules/Calculators/A&P                      BP (!) 134/53   Pulse 60   Temp 97.6 F (36.4 C) (Oral)   Resp 13   Ht 5\' 4"  (1.626 m)   Wt 96.1 kg   SpO2 94%   BMI 36.37 kg/m   Final Clinical Impression(s) / ED Diagnoses Final diagnoses:  Nausea vomiting and diarrhea  Dehydration    Rx / DC Orders ED Discharge Orders         Ordered    ondansetron (ZOFRAN) 4 MG tablet  Every 8 hours PRN     04/08/20 1825    loperamide (IMODIUM) 2 MG capsule   4 times daily PRN     04/08/20 1825         1:12 PM Patient with history of IBS, and CLL here with abdominal pain and associate nausea vomiting diarrhea.  Abdomen is diffusely tender on exam, will obtain abdominal pelvis CT scan for evaluation.  Suspect colitis/diverticular disease.  2:18 PM Leukocytosis with WBC 61.2, improved from prior and this is related to [name].  5:06 PM UA without signs of urinary tract infection.  Lipase is normal, mild AKI with a creatinine of 1.1, patient received IV fluid.  Mildly elevated total bili of 1.3.  An abdominal pelvis CT scan demonstrated no acute finding.  EKG without acute ischemic changes.  C. difficile stool culture ordered.  Patient report her symptom improved with pain medication.  At this time she is still hesitant to eat or drink anything.  Will perform p.o. trial.  6:23 PM C.diff test is negative.  Pt tolerates PO. Stable for discharge.  outpt f/u recommended.  Return precaution given.     6:58 PM Upon getting patient up for discharge, patient reports she feels very weak and dizzy and could hardly stand.  Blood pressure is soft and she became symptomatic with orthostatic vital sign.  In the setting, will consult medicine for admission for observation.  7:27 PM Appreciate consultation with [facility] Dr. Adela Ports who agrees to see and admit pt for observation. Screening COVID-19 test ordered.  Calaya Schab [place] was evaluated in Emergency Department on 04/08/2020 for the symptoms described in the history of present  illness. She was evaluated in the context of the global COVID-19 pandemic, which necessitated consideration that the patient might be at risk for infection with the SARS-CoV-2 virus that causes COVID-19. Institutional protocols and algorithms that pertain to the evaluation of patients at risk for COVID-19 are in a state of rapid change based on information released by regulatory bodies including the CDC and federal and state  organizations. These policies and algorithms were followed during the patient's care in the ED.    Domenic Moras, PA-C 04/08/20 1927    Lacretia Leigh, MD 04/13/20 1213

## 2020-04-09 DIAGNOSIS — K219 Gastro-esophageal reflux disease without esophagitis: Secondary | ICD-10-CM | POA: Diagnosis present

## 2020-04-09 DIAGNOSIS — G4733 Obstructive sleep apnea (adult) (pediatric): Secondary | ICD-10-CM | POA: Diagnosis present

## 2020-04-09 DIAGNOSIS — Z79899 Other long term (current) drug therapy: Secondary | ICD-10-CM | POA: Diagnosis not present

## 2020-04-09 DIAGNOSIS — N179 Acute kidney failure, unspecified: Secondary | ICD-10-CM | POA: Diagnosis present

## 2020-04-09 DIAGNOSIS — A0811 Acute gastroenteropathy due to Norwalk agent: Secondary | ICD-10-CM | POA: Diagnosis present

## 2020-04-09 DIAGNOSIS — F418 Other specified anxiety disorders: Secondary | ICD-10-CM | POA: Diagnosis present

## 2020-04-09 DIAGNOSIS — E860 Dehydration: Secondary | ICD-10-CM | POA: Diagnosis present

## 2020-04-09 DIAGNOSIS — Z9049 Acquired absence of other specified parts of digestive tract: Secondary | ICD-10-CM | POA: Diagnosis not present

## 2020-04-09 DIAGNOSIS — E782 Mixed hyperlipidemia: Secondary | ICD-10-CM | POA: Diagnosis present

## 2020-04-09 DIAGNOSIS — E039 Hypothyroidism, unspecified: Secondary | ICD-10-CM | POA: Diagnosis present

## 2020-04-09 DIAGNOSIS — Z87891 Personal history of nicotine dependence: Secondary | ICD-10-CM | POA: Diagnosis not present

## 2020-04-09 DIAGNOSIS — C9110 Chronic lymphocytic leukemia of B-cell type not having achieved remission: Secondary | ICD-10-CM | POA: Diagnosis present

## 2020-04-09 DIAGNOSIS — K529 Noninfective gastroenteritis and colitis, unspecified: Secondary | ICD-10-CM | POA: Diagnosis not present

## 2020-04-09 DIAGNOSIS — Z20822 Contact with and (suspected) exposure to covid-19: Secondary | ICD-10-CM | POA: Diagnosis present

## 2020-04-09 DIAGNOSIS — R7303 Prediabetes: Secondary | ICD-10-CM | POA: Diagnosis present

## 2020-04-09 DIAGNOSIS — E669 Obesity, unspecified: Secondary | ICD-10-CM | POA: Diagnosis present

## 2020-04-09 DIAGNOSIS — D696 Thrombocytopenia, unspecified: Secondary | ICD-10-CM | POA: Diagnosis present

## 2020-04-09 DIAGNOSIS — Z6837 Body mass index (BMI) 37.0-37.9, adult: Secondary | ICD-10-CM | POA: Diagnosis not present

## 2020-04-09 DIAGNOSIS — Z90710 Acquired absence of both cervix and uterus: Secondary | ICD-10-CM | POA: Diagnosis not present

## 2020-04-09 DIAGNOSIS — R112 Nausea with vomiting, unspecified: Secondary | ICD-10-CM | POA: Diagnosis present

## 2020-04-09 DIAGNOSIS — I10 Essential (primary) hypertension: Secondary | ICD-10-CM | POA: Diagnosis present

## 2020-04-09 DIAGNOSIS — Z79890 Hormone replacement therapy (postmenopausal): Secondary | ICD-10-CM | POA: Diagnosis not present

## 2020-04-09 LAB — GASTROINTESTINAL PANEL BY PCR, STOOL (REPLACES STOOL CULTURE)

## 2020-04-09 LAB — COMPREHENSIVE METABOLIC PANEL
ALT: 21 U/L (ref 0–44)
AST: 27 U/L (ref 15–41)
Albumin: 3.4 g/dL — ABNORMAL LOW (ref 3.5–5.0)
Alkaline Phosphatase: 48 U/L (ref 38–126)
Anion gap: 5 (ref 5–15)
BUN: 10 mg/dL (ref 8–23)
CO2: 23 mmol/L (ref 22–32)
Calcium: 7.2 mg/dL — ABNORMAL LOW (ref 8.9–10.3)
Chloride: 106 mmol/L (ref 98–111)
Creatinine, Ser: 0.96 mg/dL (ref 0.44–1.00)
GFR calc Af Amer: >60 mL/min (ref >60)
GFR calc non Af Amer: 59 mL/min — ABNORMAL LOW (ref >60)
Glucose, Bld: 101 mg/dL — ABNORMAL HIGH (ref 70–99)
Potassium: 3.8 mmol/L (ref 3.5–5.1)
Sodium: 134 mmol/L — ABNORMAL LOW (ref 135–145)
Total Bilirubin: 0.9 mg/dL (ref 0.3–1.2)
Total Protein: 5.2 g/dL — ABNORMAL LOW (ref 6.5–8.1)

## 2020-04-09 LAB — CBC WITH DIFFERENTIAL/PLATELET
Abs Immature Granulocytes: 0.15 10*3/uL — ABNORMAL HIGH (ref 0.00–0.07)
Basophils Absolute: 0.1 10*3/uL (ref 0.0–0.1)
Basophils Relative: 0 %
Eosinophils Absolute: 0.3 10*3/uL (ref 0.0–0.5)
Eosinophils Relative: 1 %
HCT: 33.2 % — ABNORMAL LOW (ref 36.0–46.0)
Hemoglobin: 10.6 g/dL — ABNORMAL LOW (ref 12.0–15.0)
Immature Granulocytes: 0 %
Lymphocytes Relative: 88 %
Lymphs Abs: 43.8 10*3/uL — ABNORMAL HIGH (ref 0.7–4.0)
MCH: 33.4 pg (ref 26.0–34.0)
MCHC: 31.9 g/dL (ref 30.0–36.0)
MCV: 104.7 fL — ABNORMAL HIGH (ref 80.0–100.0)
Monocytes Absolute: 0.6 10*3/uL (ref 0.1–1.0)
Monocytes Relative: 1 %
Neutro Abs: 4.8 10*3/uL (ref 1.7–7.7)
Neutrophils Relative %: 10 %
Platelets: 74 10*3/uL — ABNORMAL LOW (ref 150–400)
RBC: 3.17 MIL/uL — ABNORMAL LOW (ref 3.87–5.11)
RDW: 13.2 % (ref 11.5–15.5)
WBC: 49.7 10*3/uL — ABNORMAL HIGH (ref 4.0–10.5)
nRBC: 0 % (ref 0.0–0.2)

## 2020-04-09 LAB — MAGNESIUM: Magnesium: 2.4 mg/dL (ref 1.7–2.4)

## 2020-04-09 MED ORDER — ALPRAZOLAM 0.5 MG PO TABS
0.5000 mg | ORAL_TABLET | Freq: Three times a day (TID) | ORAL | Status: DC | PRN
  Administered 2020-04-09 – 2020-04-10 (×2): 1 mg via ORAL
  Filled 2020-04-09 (×3): qty 2

## 2020-04-09 NOTE — Progress Notes (Addendum)
PROGRESS NOTE    MITSUE BALASUBRAMANIAN  [id number] DOB: 1946/03/02 DOA: 04/08/2020 PCP: [name], MD     Brief Narrative:  CLARIECE YUNK is a 74 y.o. female with medical history significant of anemia, anxiety/depression, osteoarthritis, CLL, GERD, history of sinus headaches and recurrent sinus infections, history of urolithiasis, hypertension, OSA, history of pneumonia, prediabetes who came to the emergency department due to multiple daily episodes of loose stools since Sunday associated with episodes of emesis and abdominal pain. C. difficile antigen and C. difficile toxin and stool were negative. Due to continued weakness, poor tolerance to PO intake, patient was given IVF and admitted to the hospital for suspected gastroenteritis.   New events last 24 hours / Subjective: She states that her vomiting has resolved but continues to have diarrhea.  Has had 4 episodes of loose brown stool already this morning by 10:00.  She also admits to tenderness in her abdomen and bloating.  She states that anytime she takes anything p.o., it runs straight through her.  She does not feel that she can advance her diet quite yet.  Assessment & Plan:   Principal Problem:   Acute gastroenteritis Active Problems:   CLL (chronic lymphocytic leukemia) ([facility])   GERD (gastroesophageal reflux disease)   OSA (obstructive sleep apnea)   HTN (hypertension)   Mixed hyperlipidemia   Thrombocytopenia ([facility])   AKI (acute kidney injury) ([place])   Anxiety with depression   Hyperbilirubinemia   Hypocalcemia   Hypomagnesemia   Vomiting and diarrhea, suspected acute gastroenteritis -C. difficile negative -CT A/P: No acute abnormality  -GI PCR pending -Continue IV fluid -Continue clear liquid diet for now  Hypomagnesemia -Replaced, repeat lab pending this morning   AKI -Baseline Cr 0.8 -Resolved, Cr 0.96 today   GERD -Continue PPI  CLL -Follows with hematology/oncology  Essential  hypertension -Holding bisoprolol and hydrochlorothiazide in setting of AKI and volume loss  Hypothyroidism -Continue synthroid   Anxiety and depression -Continue prozac, xanax prn     DVT prophylaxis: SCD Code Status: Full code Family Communication: Spouse at bedside Disposition Plan:   Status is: Inpatient  Remains inpatient appropriate because:Persistent severe electrolyte disturbances and IV treatments appropriate due to intensity of illness or inability to take PO   Dispo: The patient is from: Home              Anticipated d/c is to: Home              Anticipated d/c date is: 1 day              Patient currently is not medically stable to d/c.  Unable to tolerate adequate p.o., continues to require IV fluids.   Consultants:   None  Procedures:   None  Antimicrobials:  Anti-infectives (From admission, onward)   None        Objective: Vitals:   04/09/20 0700 04/09/20 0715 04/09/20 0730 04/09/20 0745  BP: (!) 108/53 (!) 113/46 (!) 116/50 (!) 108/48  Pulse: (!) 51 (!) 52 (!) 51 (!) 51  Resp: 13 17 16 16   Temp:      TempSrc:      SpO2: 100% 99% 100% 99%  Weight:      Height:        Intake/Output Summary (Last 24 hours) at 04/09/2020 1040 Last data filed at 04/08/2020 1518 Gross per 24 hour  Intake 1000 ml  Output --  Net 1000 ml   Filed Weights   04/08/20 1146  Weight:  96.1 kg    Examination:  General exam: Appears calm and comfortable  Respiratory system: Respiratory effort normal. No respiratory distress. No conversational dyspnea.  Cardiovascular system: S1 & S2 heard, RRR. No murmurs. No pedal edema. Gastrointestinal system: Abdomen is mildly distended, soft and nontender to palpation.  Central nervous system: Alert and oriented. Speech clear.  Extremities: Symmetric in appearance  Skin: No rashes, lesions or ulcers on exposed skin  Psychiatry: Judgement and insight appear normal. Mood & affect appropriate.   Data Reviewed: I have  personally reviewed following labs and imaging studies  CBC: Recent Labs  Lab 04/08/20 1100 04/09/20 0442  WBC 61.2* 49.7*  NEUTROABS  --  4.8  HGB 12.3 10.6*  HCT 37.5 33.2*  MCV 100.8* 104.7*  PLT 115* 74*   Basic Metabolic Panel: Recent Labs  Lab 04/08/20 1100 04/08/20 1955 04/09/20 0442  NA 134*  --  134*  K 3.5  --  3.8  CL 101  --  106  CO2 20*  --  23  GLUCOSE 136*  --  101*  BUN 10  --  10  CREATININE 1.10*  --  0.96  CALCIUM 8.5*  --  7.2*  MG  --  1.2*  --   PHOS  --  2.6  --    GFR: Estimated Creatinine Clearance: 58.7 mL/min (by C-G formula based on SCr of 0.96 mg/dL). Liver Function Tests: Recent Labs  Lab 04/08/20 1100 04/09/20 0442  AST 37 27  ALT 26 21  ALKPHOS 59 48  BILITOT 1.3* 0.9  PROT 6.1* 5.2*  ALBUMIN 3.9 3.4*   Recent Labs  Lab 04/08/20 1100  LIPASE 21   No results for input(s): AMMONIA in the last 168 hours. Coagulation Profile: No results for input(s): INR, PROTIME in the last 168 hours. Cardiac Enzymes: No results for input(s): CKTOTAL, CKMB, CKMBINDEX, TROPONINI in the last 168 hours. BNP (last 3 results) No results for input(s): PROBNP in the last 8760 hours. HbA1C: No results for input(s): HGBA1C in the last 72 hours. CBG: No results for input(s): GLUCAP in the last 168 hours. Lipid Profile: No results for input(s): CHOL, HDL, LDLCALC, TRIG, CHOLHDL, LDLDIRECT in the last 72 hours. Thyroid Function Tests: No results for input(s): TSH, T4TOTAL, FREET4, T3FREE, THYROIDAB in the last 72 hours. Anemia Panel: No results for input(s): VITAMINB12, FOLATE, FERRITIN, TIBC, IRON, RETICCTPCT in the last 72 hours. Sepsis Labs: No results for input(s): PROCALCITON, LATICACIDVEN in the last 168 hours.  Recent Results (from the past 240 hour(s))  C Difficile Quick Screen w PCR reflex     Status: None   Collection Time: 04/08/20  5:36 PM   Specimen: STOOL  Result Value Ref Range Status   C Diff antigen NEGATIVE NEGATIVE Final    C Diff toxin NEGATIVE NEGATIVE Final   C Diff interpretation No C. difficile detected.  Final    Comment: Performed at [facility], [place] [address] [place], [place] [address]  Respiratory Panel by RT PCR (Flu A&B, Covid) - Nasopharyngeal Swab     Status: None   Collection Time: 04/08/20  7:48 PM   Specimen: Nasopharyngeal Swab  Result Value Ref Range Status   SARS Coronavirus 2 by RT PCR NEGATIVE NEGATIVE Final    Comment: (NOTE) SARS-CoV-2 target nucleic acids are NOT DETECTED. The SARS-CoV-2 RNA is generally detectable in upper respiratoy specimens during the acute phase of infection. The lowest concentration of SARS-CoV-2 viral copies this assay can detect is 131  copies/mL. A negative result does not preclude SARS-Cov-2 infection and should not be used as the sole basis for treatment or other patient management decisions. A negative result may occur with  improper specimen collection/handling, submission of specimen other than nasopharyngeal swab, presence of viral mutation(s) within the areas targeted by this assay, and inadequate number of viral copies (<131 copies/mL). A negative result must be combined with clinical observations, patient history, and epidemiological information. The expected result is Negative. Fact Sheet for Patients:  PinkCheek.be Fact Sheet for Healthcare Providers:  GravelBags.it This test is not yet ap proved or cleared by the [place] FDA and  has been authorized for detection and/or diagnosis of SARS-CoV-2 by FDA under an Emergency Use Authorization (EUA). This EUA will remain  in effect (meaning this test can be used) for the duration of the COVID-19 declaration under Section 564(b)(1) of the Act, 21 U.S.C. section 360bbb-3(b)(1), unless the authorization is terminated or revoked sooner.    Influenza A by PCR NEGATIVE NEGATIVE Final   Influenza B by PCR NEGATIVE NEGATIVE Final     Comment: (NOTE) The Xpert Xpress SARS-CoV-2/FLU/RSV assay is intended as an aid in  the diagnosis of influenza from Nasopharyngeal swab specimens and  should not be used as a sole basis for treatment. Nasal washings and  aspirates are unacceptable for Xpert Xpress SARS-CoV-2/FLU/RSV  testing. Fact Sheet for Patients: PinkCheek.be Fact Sheet for Healthcare Providers: GravelBags.it This test is not yet approved or cleared by the [place] FDA and  has been authorized for detection and/or diagnosis of SARS-CoV-2 by  FDA under an Emergency Use Authorization (EUA). This EUA will remain  in effect (meaning this test can be used) for the duration of the  Covid-19 declaration under Section 564(b)(1) of the Act, 21  U.S.C. section 360bbb-3(b)(1), unless the authorization is  terminated or revoked. Performed at [facility], [place] [address] [place], [place] [address]       Radiology Studies: CT ABDOMEN PELVIS WO CONTRAST  Result Date: 04/08/2020 CLINICAL DATA:  Abdominal pain with diarrhea EXAM: CT ABDOMEN AND PELVIS WITHOUT CONTRAST TECHNIQUE: Multidetector CT imaging of the abdomen and pelvis was performed following the standard protocol without IV contrast. COMPARISON:  2017 FINDINGS: Lower chest: Mild bibasilar atelectasis. Hepatobiliary: Unchanged small cyst of the right hepatic lobe. Post cholecystectomy. No unexpected biliary dilatation. Pancreas: Unremarkable. Spleen: Unremarkable. Adrenals/Urinary Tract: Adrenals and kidneys are unremarkable. The bladder is minimally distended but unremarkable. Stomach/Bowel: Stomach is within normal limits. The bowel is normal in caliber. Appendix is not visualized and probably surgically absent. Vascular/Lymphatic: Minor aortic atherosclerosis. No enlarged lymph nodes identified. Reproductive: Status post hysterectomy. No adnexal masses. Other: No ascites. Musculoskeletal: Degenerative  changes of the lumbar spine. IMPRESSION: No acute abnormality or findings to account for reported symptoms. Electronically Signed   By: Macy Mis M.D.   On: 04/08/2020 16:31      Scheduled Meds: . ezetimibe  10 mg Oral QHS  . FLUoxetine  20 mg Oral Daily  . levothyroxine  50 mcg Oral Daily  . loratadine  10 mg Oral Daily  . olopatadine  1 drop Both Eyes BID  . pantoprazole  40 mg Oral Daily  . sodium chloride flush  3 mL Intravenous Once   Continuous Infusions: . 0.9 % NaCl with KCl 20 mEq / L 125 mL/hr at 04/09/20 0435     LOS: 0 days      Time spent: 35 minutes   Dessa Phi, DO [facility] 04/09/2020, 10:40 AM   Available via Epic secure chat 7am-7pm After these hours, please refer to coverage provider listed on [email or web address]

## 2020-04-09 NOTE — Plan of Care (Signed)
  Problem: Safety: Goal: Ability to remain free from injury will improve Outcome: Progressing   Problem: Elimination: Goal: Will not experience complications related to bowel motility Outcome: Progressing

## 2020-04-10 DIAGNOSIS — A0811 Acute gastroenteropathy due to Norwalk agent: Secondary | ICD-10-CM

## 2020-04-10 HISTORY — DX: Acute gastroenteropathy due to Norwalk agent: A08.11

## 2020-04-10 LAB — CBC
HCT: 31.7 % — ABNORMAL LOW (ref 36.0–46.0)
Hemoglobin: 10 g/dL — ABNORMAL LOW (ref 12.0–15.0)
MCH: 33.3 pg (ref 26.0–34.0)
MCHC: 31.5 g/dL (ref 30.0–36.0)
MCV: 105.7 fL — ABNORMAL HIGH (ref 80.0–100.0)
Platelets: 69 10*3/uL — ABNORMAL LOW (ref 150–400)
RBC: 3 MIL/uL — ABNORMAL LOW (ref 3.87–5.11)
RDW: 13.2 % (ref 11.5–15.5)
WBC: 30.1 10*3/uL — ABNORMAL HIGH (ref 4.0–10.5)
nRBC: 0 % (ref 0.0–0.2)

## 2020-04-10 LAB — BASIC METABOLIC PANEL
Anion gap: 8 (ref 5–15)
BUN: <5 mg/dL — ABNORMAL LOW (ref 8–23)
CO2: 16 mmol/L — ABNORMAL LOW (ref 22–32)
Calcium: 7.1 mg/dL — ABNORMAL LOW (ref 8.9–10.3)
Chloride: 114 mmol/L — ABNORMAL HIGH (ref 98–111)
Creatinine, Ser: 0.69 mg/dL (ref 0.44–1.00)
GFR calc Af Amer: >60 mL/min (ref >60)
GFR calc non Af Amer: >60 mL/min (ref >60)
Glucose, Bld: 90 mg/dL (ref 70–99)
Potassium: 4.2 mmol/L (ref 3.5–5.1)
Sodium: 138 mmol/L (ref 135–145)

## 2020-04-10 LAB — MAGNESIUM: Magnesium: 1.9 mg/dL (ref 1.7–2.4)

## 2020-04-10 LAB — PATHOLOGIST SMEAR REVIEW

## 2020-04-10 MED ORDER — NYSTATIN 100000 UNIT/ML MT SUSP
5.0000 mL | Freq: Four times a day (QID) | OROMUCOSAL | Status: DC
  Administered 2020-04-10 – 2020-04-11 (×2): 500000 [IU] via OROMUCOSAL
  Filled 2020-04-10 (×2): qty 5

## 2020-04-10 NOTE — Progress Notes (Signed)
PROGRESS NOTE    Rebecca Bond  [id number] DOB: September 27, 1946 DOA: 04/08/2020 PCP: [name], MD     Brief Narrative:  Rebecca Bond is a 74 y.o. female with medical history significant of anemia, anxiety/depression, osteoarthritis, CLL, GERD, history of sinus headaches and recurrent sinus infections, history of urolithiasis, hypertension, OSA, history of pneumonia, prediabetes who came to the emergency department due to multiple daily episodes of loose stools since Sunday associated with episodes of emesis and abdominal pain. C. difficile antigen and C. difficile toxin and stool were negative. Due to continued weakness, poor tolerance to PO intake, patient was given IVF and admitted to the hospital for suspected gastroenteritis.   New events last 24 hours / Subjective: No further vomiting, continues to have diarrhea but has lessened in frequency.  Continues to be very weak.  Complains of some epigastric abdominal tightness/tenderness.  Admits that she believes her family members have had GI illnesses, similar to her symptoms  Assessment & Plan:   Principal Problem:   Acute gastroenteritis Active Problems:   CLL (chronic lymphocytic leukemia) ([place])   GERD (gastroesophageal reflux disease)   OSA (obstructive sleep apnea)   HTN (hypertension)   Mixed hyperlipidemia   Thrombocytopenia (HCC)   AKI (acute kidney injury) ([place])   Anxiety with depression   Hyperbilirubinemia   Hypocalcemia   Hypomagnesemia   Norovirus -C. difficile negative -CT A/P: No acute abnormality  -Advance diet to full liquid -Supportive care  AKI -Baseline Cr 0.8 -Resolved, Cr 0.69 today   GERD -Continue PPI  CLL -Follows with hematology/oncology  Essential hypertension -Holding bisoprolol and hydrochlorothiazide in setting of decreased blood pressure  Hypothyroidism -Continue synthroid   Anxiety and depression -Continue prozac, xanax prn     DVT prophylaxis: SCD Code Status:  Full code Family Communication: No family at bedside Disposition Plan:   Status is: Inpatient  Remains inpatient appropriate because:Inpatient level of care appropriate due to severity of illness   Dispo: The patient is from: Home              Anticipated d/c is to: Home              Anticipated d/c date is: 1 day              Patient currently is not medically stable to d/c.  Plan for discharge home 5/8 as long as diarrhea improving, weakness improving.  Patient reports that she does not feel ready to go home until her diarrhea has resolved.   Consultants:   None  Procedures:   None  Antimicrobials:  Anti-infectives (From admission, onward)   None       Objective: Vitals:   04/09/20 0730 04/09/20 0745 04/09/20 2116 04/10/20 0539  BP: (!) 116/50 (!) 108/48 (!) 157/56 129/69  Pulse: (!) 51 (!) 51 64 62  Resp: 16 16 16 18   Temp:   98.1 F (36.7 C) (!) 97.4 F (36.3 C)  TempSrc:   Oral Oral  SpO2: 100% 99% 96% 98%  Weight:      Height:        Intake/Output Summary (Last 24 hours) at 04/10/2020 1230 Last data filed at 04/09/2020 2200 Gross per 24 hour  Intake 550 ml  Output --  Net 550 ml   Filed Weights   04/08/20 1146  Weight: 96.1 kg    Examination: General exam: Appears calm and comfortable  Respiratory system: Respiratory effort normal.  On room air Cardiovascular system: No pedal edema. Gastrointestinal  system: Abdomen is nondistended, soft and nontender.  Central nervous system: Alert and oriented. Non focal exam. Speech clear  Extremities: Symmetric in appearance bilaterally  Skin: No rashes, lesions or ulcers on exposed skin  Psychiatry: Judgement and insight appear stable. Mood & affect appropriate.    Data Reviewed: I have personally reviewed following labs and imaging studies  CBC: Recent Labs  Lab 04/08/20 1100 04/09/20 0442 04/10/20 0804  WBC 61.2* 49.7* 30.1*  NEUTROABS  --  4.8  --   HGB 12.3 10.6* 10.0*  HCT 37.5 33.2* 31.7*   MCV 100.8* 104.7* 105.7*  PLT 115* 74* 69*   Basic Metabolic Panel: Recent Labs  Lab 04/08/20 1100 04/08/20 1955 04/09/20 0442 04/10/20 0804  NA 134*  --  134* 138  K 3.5  --  3.8 4.2  CL 101  --  106 114*  CO2 20*  --  23 16*  GLUCOSE 136*  --  101* 90  BUN 10  --  10 <5*  CREATININE 1.10*  --  0.96 0.69  CALCIUM 8.5*  --  7.2* 7.1*  MG  --  1.2* 2.4 1.9  PHOS  --  2.6  --   --    GFR: Estimated Creatinine Clearance: 70.5 mL/min (by C-G formula based on SCr of 0.69 mg/dL). Liver Function Tests: Recent Labs  Lab 04/08/20 1100 04/09/20 0442  AST 37 27  ALT 26 21  ALKPHOS 59 48  BILITOT 1.3* 0.9  PROT 6.1* 5.2*  ALBUMIN 3.9 3.4*   Recent Labs  Lab 04/08/20 1100  LIPASE 21   No results for input(s): AMMONIA in the last 168 hours. Coagulation Profile: No results for input(s): INR, PROTIME in the last 168 hours. Cardiac Enzymes: No results for input(s): CKTOTAL, CKMB, CKMBINDEX, TROPONINI in the last 168 hours. BNP (last 3 results) No results for input(s): PROBNP in the last 8760 hours. HbA1C: No results for input(s): HGBA1C in the last 72 hours. CBG: No results for input(s): GLUCAP in the last 168 hours. Lipid Profile: No results for input(s): CHOL, HDL, LDLCALC, TRIG, CHOLHDL, LDLDIRECT in the last 72 hours. Thyroid Function Tests: No results for input(s): TSH, T4TOTAL, FREET4, T3FREE, THYROIDAB in the last 72 hours. Anemia Panel: No results for input(s): VITAMINB12, FOLATE, FERRITIN, TIBC, IRON, RETICCTPCT in the last 72 hours. Sepsis Labs: No results for input(s): PROCALCITON, LATICACIDVEN in the last 168 hours.  Recent Results (from the past 240 hour(s))  C Difficile Quick Screen w PCR reflex     Status: None   Collection Time: 04/08/20  5:36 PM   Specimen: STOOL  Result Value Ref Range Status   C Diff antigen NEGATIVE NEGATIVE Final   C Diff toxin NEGATIVE NEGATIVE Final   C Diff interpretation No C. difficile detected.  Final    Comment:  Performed at [facility], [place] [address], [place], [place] [address]  Respiratory Panel by RT PCR (Flu A&B, Covid) - Nasopharyngeal Swab     Status: None   Collection Time: 04/08/20  7:48 PM   Specimen: Nasopharyngeal Swab  Result Value Ref Range Status   SARS Coronavirus 2 by RT PCR NEGATIVE NEGATIVE Final    Comment: (NOTE) SARS-CoV-2 target nucleic acids are NOT DETECTED. The SARS-CoV-2 RNA is generally detectable in upper respiratoy specimens during the acute phase of infection. The lowest concentration of SARS-CoV-2 viral copies this assay can detect is 131 copies/mL. A negative result does not preclude SARS-Cov-2 infection and should not be used as  the sole basis for treatment or other patient management decisions. A negative result may occur with  improper specimen collection/handling, submission of specimen other than nasopharyngeal swab, presence of viral mutation(s) within the areas targeted by this assay, and inadequate number of viral copies (<131 copies/mL). A negative result must be combined with clinical observations, patient history, and epidemiological information. The expected result is Negative. Fact Sheet for Patients:  PinkCheek.be Fact Sheet for Healthcare Providers:  GravelBags.it This test is not yet ap proved or cleared by the [place] FDA and  has been authorized for detection and/or diagnosis of SARS-CoV-2 by FDA under an Emergency Use Authorization (EUA). This EUA will remain  in effect (meaning this test can be used) for the duration of the COVID-19 declaration under Section 564(b)(1) of the Act, 21 U.S.C. section 360bbb-3(b)(1), unless the authorization is terminated or revoked sooner.    Influenza A by PCR NEGATIVE NEGATIVE Final   Influenza B by PCR NEGATIVE NEGATIVE Final    Comment: (NOTE) The Xpert Xpress SARS-CoV-2/FLU/RSV assay is intended as an aid in  the diagnosis of  influenza from Nasopharyngeal swab specimens and  should not be used as a sole basis for treatment. Nasal washings and  aspirates are unacceptable for Xpert Xpress SARS-CoV-2/FLU/RSV  testing. Fact Sheet for Patients: PinkCheek.be Fact Sheet for Healthcare Providers: GravelBags.it This test is not yet approved or cleared by the [place] FDA and  has been authorized for detection and/or diagnosis of SARS-CoV-2 by  FDA under an Emergency Use Authorization (EUA). This EUA will remain  in effect (meaning this test can be used) for the duration of the  Covid-19 declaration under Section 564(b)(1) of the Act, 21  U.S.C. section 360bbb-3(b)(1), unless the authorization is  terminated or revoked. Performed at [facility], [place] [address] [place], [place] [address]   Gastrointestinal Panel by PCR , Stool     Status: Abnormal   Collection Time: 04/09/20  4:39 AM   Specimen: Stool  Result Value Ref Range Status   Campylobacter species NOT DETECTED NOT DETECTED Final   Plesimonas shigelloides NOT DETECTED NOT DETECTED Final   Salmonella species NOT DETECTED NOT DETECTED Final   Yersinia enterocolitica NOT DETECTED NOT DETECTED Final   Vibrio species NOT DETECTED NOT DETECTED Final   Vibrio cholerae NOT DETECTED NOT DETECTED Final   Enteroaggregative E coli (EAEC) NOT DETECTED NOT DETECTED Final   Enteropathogenic E coli (EPEC) NOT DETECTED NOT DETECTED Final   Enterotoxigenic E coli (ETEC) NOT DETECTED NOT DETECTED Final   Shiga like toxin producing E coli (STEC) NOT DETECTED NOT DETECTED Final   Shigella/Enteroinvasive E coli (EIEC) NOT DETECTED NOT DETECTED Final   Cryptosporidium NOT DETECTED NOT DETECTED Final   Cyclospora cayetanensis NOT DETECTED NOT DETECTED Final   Entamoeba histolytica NOT DETECTED NOT DETECTED Final   Giardia lamblia NOT DETECTED NOT DETECTED Final   Adenovirus F40/41 NOT DETECTED NOT DETECTED  Final   Astrovirus NOT DETECTED NOT DETECTED Final   Norovirus GI/GII DETECTED (A) NOT DETECTED Final    Comment: RESULT CALLED TO, READ BACK BY AND VERIFIED WITH: RYAN SANTIAGO 04/09/20 1546 KLW    Rotavirus A NOT DETECTED NOT DETECTED Final   Sapovirus (I, II, IV, and V) NOT DETECTED NOT DETECTED Final    Comment: Performed at [facility], [address], [place], [place] [address]      Radiology Studies: CT ABDOMEN PELVIS WO CONTRAST  Result Date: 04/08/2020 CLINICAL DATA:  Abdominal pain  with diarrhea EXAM: CT ABDOMEN AND PELVIS WITHOUT CONTRAST TECHNIQUE: Multidetector CT imaging of the abdomen and pelvis was performed following the standard protocol without IV contrast. COMPARISON:  2017 FINDINGS: Lower chest: Mild bibasilar atelectasis. Hepatobiliary: Unchanged small cyst of the right hepatic lobe. Post cholecystectomy. No unexpected biliary dilatation. Pancreas: Unremarkable. Spleen: Unremarkable. Adrenals/Urinary Tract: Adrenals and kidneys are unremarkable. The bladder is minimally distended but unremarkable. Stomach/Bowel: Stomach is within normal limits. The bowel is normal in caliber. Appendix is not visualized and probably surgically absent. Vascular/Lymphatic: Minor aortic atherosclerosis. No enlarged lymph nodes identified. Reproductive: Status post hysterectomy. No adnexal masses. Other: No ascites. Musculoskeletal: Degenerative changes of the lumbar spine. IMPRESSION: No acute abnormality or findings to account for reported symptoms. Electronically Signed   By: Macy Mis M.D.   On: 04/08/2020 16:31      Scheduled Meds: . ezetimibe  10 mg Oral QHS  . FLUoxetine  20 mg Oral Daily  . levothyroxine  50 mcg Oral Daily  . loratadine  10 mg Oral Daily  . olopatadine  1 drop Both Eyes BID  . pantoprazole  40 mg Oral Daily  . sodium chloride flush  3 mL Intravenous Once   Continuous Infusions: . 0.9 % NaCl with KCl 20 mEq / L 125 mL/hr at 04/09/20 1258     LOS:  1 day      Time spent: 25 minutes   Dessa Phi, DO [facility] 04/10/2020, 12:30 PM   Available via Epic secure chat 7am-7pm After these hours, please refer to coverage provider listed on [email or web address]

## 2020-04-11 DIAGNOSIS — A0811 Acute gastroenteropathy due to Norwalk agent: Principal | ICD-10-CM

## 2020-04-11 LAB — BASIC METABOLIC PANEL
Anion gap: 8 (ref 5–15)
BUN: <5 mg/dL — ABNORMAL LOW (ref 8–23)
CO2: 20 mmol/L — ABNORMAL LOW (ref 22–32)
Calcium: 8.2 mg/dL — ABNORMAL LOW (ref 8.9–10.3)
Chloride: 109 mmol/L (ref 98–111)
Creatinine, Ser: 0.77 mg/dL (ref 0.44–1.00)
GFR calc Af Amer: >60 mL/min (ref >60)
GFR calc non Af Amer: >60 mL/min (ref >60)
Glucose, Bld: 93 mg/dL (ref 70–99)
Potassium: 4.2 mmol/L (ref 3.5–5.1)
Sodium: 137 mmol/L (ref 135–145)

## 2020-04-11 LAB — CBC
HCT: 33 % — ABNORMAL LOW (ref 36.0–46.0)
Hemoglobin: 10.5 g/dL — ABNORMAL LOW (ref 12.0–15.0)
MCH: 32.6 pg (ref 26.0–34.0)
MCHC: 31.8 g/dL (ref 30.0–36.0)
MCV: 102.5 fL — ABNORMAL HIGH (ref 80.0–100.0)
Platelets: 84 10*3/uL — ABNORMAL LOW (ref 150–400)
RBC: 3.22 MIL/uL — ABNORMAL LOW (ref 3.87–5.11)
RDW: 13.2 % (ref 11.5–15.5)
WBC: 28.8 10*3/uL — ABNORMAL HIGH (ref 4.0–10.5)
nRBC: 0 % (ref 0.0–0.2)

## 2020-04-11 LAB — MAGNESIUM: Magnesium: 1.8 mg/dL (ref 1.7–2.4)

## 2020-04-11 MED ORDER — DICYCLOMINE HCL 10 MG PO CAPS: 10.0000 mg | ORAL_CAPSULE | Freq: Three times a day (TID) | ORAL | 0 refills | Status: DC | PRN

## 2020-04-11 NOTE — Discharge Instructions (Signed)
Norovirus Infection ° °Norovirus infection causes inflammation in the stomach and intestines (gastroenteritis) and food poisoning. It is caused by exposure to a virus in a group of similar viruses called noroviruses. °Norovirus spreads very easily from person to person (is very contagious). It often occurs in places where people are in close contact, such as schools, nursing homes, and cruise ships. You can get it from food, water, surfaces, or other people who have the virus (are contaminated). Norovirus is also found in the stool (feces) or vomit of infected people. You can spread the infection as soon as you feel sick, and you may continue to be contagious after you recover. °What are the causes? °This condition is caused by contact with norovirus. You can catch norovirus if you: °· Eat or drink something that is contaminated with norovirus. °· Touch surfaces or objects that are contaminated with norovirus and then put your hand in or by your mouth or nose. °· Have direct contact with an infected person who has symptoms. °· Share food, drink, or utensils with someone who is sick with norovirus. °What are the signs or symptoms? °Symptoms usually begin within 12 hours to 2 days after you become infected. Most norovirus symptoms affect the digestive system.Symptoms may include: °· Nausea. °· Vomiting. °· Diarrhea. °· Stomach cramps. °· Fever. °· Chills. °· Headache. °· Muscle aches. °· Tiredness. °How is this diagnosed? °This condition may be diagnosed based on: °· Your symptoms. °· A physical exam. °· A stool test. °How is this treated? °There is no specific treatment for norovirus. Most people get better without treatment in about 2 days. Young children, the elderly, and people who are already sick may take up to 6 days to recover. °Follow these instructions at home: °Eating and drinking °· Drink plenty of water to replace fluids that are lost through diarrhea and vomiting. This prevents dehydration. Drink enough  fluid to keep your urine clear or pale yellow. °· Drink clear fluids in small amounts as you are able. Clear fluids include water, ice chips, fruit juice with water added (diluted fruit juice), and low-calorie sports drinks. °? Avoid fluids that contain a lot of sugar or caffeine, such as energy drinks, sports drinks, and soda. °? Avoid alcohol. °· If instructed by your health care provider, drink an oral rehydration solution (ORS). This is a drink that is sold at pharmacies and retail stores. An ORS contains minerals (electrolytes) that you can lose through diarrhea and vomiting. °· Eat bland, easy-to-digest foods in small amounts as you are able. These foods include bananas, applesauce, rice, lean meats, toast, and crackers. °? Avoid spicy or fatty foods. °General instructions ° °· Rest at home while you recover. °· Do not prepare food for others while you are infected. Wait at least 3 days after you recover from the illness to do this. °· Take over-the-counter and prescription medicines only as told by your health care provider. °· Wash your hands frequently with soap and water. If soap and water are not available, use hand sanitizer. °· Make sure that all people in your household wash their hands well and often. °· Keep all follow-up visits as told by your health care provider. This is important. °How is this prevented? °To help prevent the spread of norovirus: °· Stay at home if you are feeling sick. This will reduce the risk of spreading the virus to others. °· Wash your hands often with soap and water for at least 20 seconds, especially after using the   toilet or changing a diaper. °· Wash fruits and vegetables thoroughly before peeling, preparing, or serving them. °· Throw out any food that a sick person may have touched. °· Disinfect contaminated surfaces immediately after someone in the household has been sick. Use a bleach-based household cleaner. °· Immediately remove and wash soiled clothes or  sheets. °Contact a health care provider if: °· You have vomiting, diarrhea, or stomach pain that gets worse. °· You have symptoms that do not go away after 2-6 days. °· You have a fever. °· You cannot drink without vomiting. °· You feel light-headed or dizzy. °· Your symptoms get worse. °Get help right away if: °You develop symptoms of dehydration that do not improve with fluid replacement, such as: °· Excessive sleepiness. °· Lack of tears. °· Very little urine production. °· Dry mouth. °· Muscle cramps. °· Weak pulse. °· Confusion. °Summary °· Norovirus infection is common and often occurs in places where people are in close contact, such as schools, nursing homes, and cruise ships. °· To help prevent the spread of this infection, wash hands with soap and water for at least 20 seconds before handling food or after having contact with stool or body fluids. °· There is no specific treatment for norovirus, but most people get better without treatment in about 2 days. People who are healthy when infected often recover sooner than those who are elderly, young, or already sick. °· Replace lost fluids by drinking plenty of water, or by drinking oral rehydration solution (ORS), which contains important minerals called electrolytes. This prevents dehydration. °This information is not intended to replace advice given to you by your health care provider. Make sure you discuss any questions you have with your health care provider. °Document Revised: 03/15/2019 Document Reviewed: 12/28/2016 °Elsevier Patient Education © 2020 [employer] ° °

## 2020-04-11 NOTE — Discharge Summary (Signed)
Physician Discharge Summary  THANVI SEAWELL [id number] DOB: 05-Dec-1946 DOA: 04/08/2020  PCP: [name], MD  Admit date: 04/08/2020 Discharge date: 04/11/2020  Admitted From: Home Disposition:  Home  Recommendations for Outpatient Follow-up:  1. Follow up with PCP in 1 week  Discharge Condition: Stable CODE STATUS: Full  Diet recommendation: Regular diet   Brief/Interim Summary: Rebecca Bond is a 74 y.o.femalewith medical history significant ofanemia, anxiety/depression, osteoarthritis, CLL, GERD, history of sinus headaches and recurrent sinus infections, history of urolithiasis, hypertension, OSA, history of pneumonia, prediabetes who came to the emergency department due tomultiple daily episodes of loose stools since Sunday associated with episodes of emesis and abdominal pain.C. difficile antigen and C. difficile toxin and stool were negative. Due to continued weakness, poor tolerance to PO intake, patient was given IVF and admitted to the hospital for suspected gastroenteritis. GI PCR panel was positive for norovirus. Patient was treated with supportive care with symptomatic improvement prior to discharge.  Discharge Diagnoses:  Principal Problem:   Norovirus Active Problems:   CLL (chronic lymphocytic leukemia) (HCC)   GERD (gastroesophageal reflux disease)   OSA (obstructive sleep apnea)   HTN (hypertension)   Mixed hyperlipidemia   Thrombocytopenia (HCC)   Acute gastroenteritis   AKI (acute kidney injury) ([place])   Anxiety with depression   Hyperbilirubinemia   Hypocalcemia   Hypomagnesemia   Norovirus -C. difficile negative -CT A/P: No acute abnormality  -Symptoms improved with supportive care  AKI -Baseline Cr 0.8 -Resolved, Cr 0.77 today   GERD -Continue PPI  CLL -Follows with hematology/oncology  Essential hypertension -Holding bisoprolol and hydrochlorothiazide in setting of decreased blood pressure.  May resume as an  outpatient  Hypothyroidism -Continue synthroid   Anxiety and depression -Continue prozac, xanax prn    Discharge Instructions  Discharge Instructions    Call MD for:  difficulty breathing, headache or visual disturbances   Complete by: As directed    Call MD for:  extreme fatigue   Complete by: As directed    Call MD for:  persistant dizziness or light-headedness   Complete by: As directed    Call MD for:  persistant nausea and vomiting   Complete by: As directed    Call MD for:  severe uncontrolled pain   Complete by: As directed    Call MD for:  temperature >100.4   Complete by: As directed    Diet general   Complete by: As directed    Discharge instructions   Complete by: As directed    You were cared for by a hospitalist during your hospital stay. If you have any questions about your discharge medications or the care you received while you were in the hospital after you are discharged, you can call the unit and ask to speak with the hospitalist on call if the hospitalist that took care of you is not available. Once you are discharged, your primary care physician will handle any further medical issues. Please note that NO REFILLS for any discharge medications will be authorized once you are discharged, as it is imperative that you return to your primary care physician (or establish a relationship with a primary care physician if you do not have one) for your aftercare needs so that they can reassess your need for medications and monitor your lab values.   Increase activity slowly   Complete by: As directed      Allergies as of 04/11/2020      Reactions   Hyzaar [losartan Potassium-hctz]  Swelling, Other (See Comments)   "messed up my sodium counts"and swelled lips   Sudafed [pseudoephedrine Hcl] Palpitations   Levaquin [levofloxacin In D5w] Diarrhea, Nausea Only   Acyclovir And Related Other (See Comments)   Reaction not recalled   Biaxin [clarithromycin]    GI Upset    Cantaloupe Extract Allergy Skin Test Other (See Comments)   Nasal congestion, headaches, and sneezing   Celexa [citalopram] Other (See Comments)   Reaction not recalled   Flexeril [cyclobenzaprine]    "zombie-like" feeling   Fosamax [alendronate Sodium]    GI upset   Iohexol Hives, Other (See Comments)   Patient stated she broke out in hive 20 yrs ago from IV contrast     Losartan Swelling, Other (See Comments)   Angioedema   Meloxicam    GI upset   Norvasc [amlodipine] Swelling, Other (See Comments)   Ankles swell   Other Nausea Only, Other (See Comments)   Iceburg lettuce- "made me dreadfully nauseous" Raw mushrooms only-  "made me dreadfully nauseous" (cooked ones are tolerated)   Pseudoephedrine    Palpitations   Zoloft [sertraline Hcl] Other (See Comments)   Has no emotions at all       Medication List    TAKE these medications   ALPRAZolam 1 MG tablet Commonly known as: XANAX Take 1/2 - 1 tablet   2 - 3 x /day ONLY if needed for Anxiety Attack &  limit to 5 days /week to avoid Addiction & Dementia What changed:   how much to take  how to take this  when to take this  additional instructions   bisoprolol-hydrochlorothiazide 10-6.25 MG tablet Commonly known as: ZIAC Take 1 tablet Daily for BP What changed:   how much to take  how to take this  when to take this  additional instructions   cetirizine 10 MG tablet Commonly known as: ZYRTEC Take 10 mg by mouth at bedtime.   dicyclomine 10 MG capsule Commonly known as: Bentyl Take 1 capsule (10 mg total) by mouth 3 (three) times daily as needed for spasms (diarrhea).   ezetimibe 10 MG tablet Commonly known as: ZETIA Take 1 tablet Daily for Cholesterol What changed:   how much to take  how to take this  when to take this   FLUoxetine 20 MG capsule Commonly known as: PROZAC Take 1 capsule (20 mg total) by mouth daily.   hyoscyamine 0.125 MG SL tablet Commonly known as: LEVSIN SL Place 0.125  mg under the tongue every 4 (four) hours as needed for cramping.   levothyroxine 50 MCG tablet Commonly known as: Synthroid Take 1 tablet (50 mcg total) by mouth daily.   loperamide 2 MG tablet Commonly known as: IMODIUM A-D Take 2 mg by mouth 4 (four) times daily as needed for diarrhea or loose stools.   Olopatadine HCl 0.2 % Soln Place 1 drop into both eyes daily as needed (for seasonal allergies/itching).   omeprazole 40 MG capsule Commonly known as: PRILOSEC Take 1 capsule Daily twice daily Prevent Heartburn & Indigestion What changed:   how much to take  how to take this  when to take this  additional instructions   PROBIOTIC PO Take 1 capsule by mouth in the morning.   Restasis 0.05 % ophthalmic emulsion Generic drug: cycloSPORINE Place 1 drop into both eyes 2 (two) times daily as needed (for seasonal allergies/irritation).   Vitamin B12 500 MCG Tabs Take 500 mcg by mouth in the morning.   vitamin  C 1000 MG tablet Take 1,500 mg by mouth daily.   Vitamin D3 250 MCG (10000 UT) Tabs Take 10,000 Units by mouth in the morning.      Follow-up Information    [name], MD. Schedule an appointment as soon as possible for a visit in 1 week(s).   Specialty: Internal Medicine Contact information: [address] [place] [address] [phone]          Allergies  Allergen Reactions  . Hyzaar [Losartan Potassium-Hctz] Swelling and Other (See Comments)    "messed up my sodium counts"and swelled lips  . Sudafed [Pseudoephedrine Hcl] Palpitations  . Levaquin [Levofloxacin In D5w] Diarrhea and Nausea Only  . Acyclovir And Related Other (See Comments)    Reaction not recalled  . Biaxin [Clarithromycin]     GI Upset  . Cantaloupe Extract Allergy Skin Test Other (See Comments)    Nasal congestion, headaches, and sneezing  . Celexa [Citalopram] Other (See Comments)    Reaction not recalled  . Flexeril [Cyclobenzaprine]      "zombie-like" feeling  . Fosamax [Alendronate Sodium]     GI upset  . Iohexol Hives and Other (See Comments)    Patient stated she broke out in hive 20 yrs ago from IV contrast     . Losartan Swelling and Other (See Comments)    Angioedema  . Meloxicam     GI upset  . Norvasc [Amlodipine] Swelling and Other (See Comments)    Ankles swell  . Other Nausea Only and Other (See Comments)    Iceburg lettuce- "made me dreadfully nauseous" Raw mushrooms only-  "made me dreadfully nauseous" (cooked ones are tolerated)  . Pseudoephedrine     Palpitations  . Zoloft [Sertraline Hcl] Other (See Comments)    Has no emotions at all     Consultations:  None   Procedures/Studies: CT ABDOMEN PELVIS WO CONTRAST  Result Date: 04/08/2020 CLINICAL DATA:  Abdominal pain with diarrhea EXAM: CT ABDOMEN AND PELVIS WITHOUT CONTRAST TECHNIQUE: Multidetector CT imaging of the abdomen and pelvis was performed following the standard protocol without IV contrast. COMPARISON:  2017 FINDINGS: Lower chest: Mild bibasilar atelectasis. Hepatobiliary: Unchanged small cyst of the right hepatic lobe. Post cholecystectomy. No unexpected biliary dilatation. Pancreas: Unremarkable. Spleen: Unremarkable. Adrenals/Urinary Tract: Adrenals and kidneys are unremarkable. The bladder is minimally distended but unremarkable. Stomach/Bowel: Stomach is within normal limits. The bowel is normal in caliber. Appendix is not visualized and probably surgically absent. Vascular/Lymphatic: Minor aortic atherosclerosis. No enlarged lymph nodes identified. Reproductive: Status post hysterectomy. No adnexal masses. Other: No ascites. Musculoskeletal: Degenerative changes of the lumbar spine. IMPRESSION: No acute abnormality or findings to account for reported symptoms. Electronically Signed   By: Macy Mis M.D.   On: 04/08/2020 16:31      Discharge Exam: Vitals:   04/10/20 2202 04/11/20 0502  BP: (!) 143/57 (!) 153/73  Pulse: 67   Resp:  18 18  Temp: 98.4 F (36.9 C) 98.4 F (36.9 C)  SpO2: 98%     General: Pt is alert, awake, not in acute distress Cardiovascular: No edema Respiratory: No respiratory distress, no conversational dyspnea, on room air  Abdominal: Soft, NT, ND Extremities: no edema, no cyanosis Psych: Normal mood and affect, stable judgement and insight     The results of significant diagnostics from this hospitalization (including imaging, microbiology, ancillary and laboratory) are listed below for reference.     Microbiology: Recent Results (from the past 240 hour(s))  C Difficile  Quick Screen w PCR reflex     Status: None   Collection Time: 04/08/20  5:36 PM   Specimen: STOOL  Result Value Ref Range Status   C Diff antigen NEGATIVE NEGATIVE Final   C Diff toxin NEGATIVE NEGATIVE Final   C Diff interpretation No C. difficile detected.  Final    Comment: Performed at [facility], [place] [address], [place], [place] [address]  Respiratory Panel by RT PCR (Flu A&B, Covid) - Nasopharyngeal Swab     Status: None   Collection Time: 04/08/20  7:48 PM   Specimen: Nasopharyngeal Swab  Result Value Ref Range Status   SARS Coronavirus 2 by RT PCR NEGATIVE NEGATIVE Final    Comment: (NOTE) SARS-CoV-2 target nucleic acids are NOT DETECTED. The SARS-CoV-2 RNA is generally detectable in upper respiratoy specimens during the acute phase of infection. The lowest concentration of SARS-CoV-2 viral copies this assay can detect is 131 copies/mL. A negative result does not preclude SARS-Cov-2 infection and should not be used as the sole basis for treatment or other patient management decisions. A negative result may occur with  improper specimen collection/handling, submission of specimen other than nasopharyngeal swab, presence of viral mutation(s) within the areas targeted by this assay, and inadequate number of viral copies (<131 copies/mL). A negative result must be combined with clinical observations,  patient history, and epidemiological information. The expected result is Negative. Fact Sheet for Patients:  PinkCheek.be Fact Sheet for Healthcare Providers:  GravelBags.it This test is not yet ap proved or cleared by the [place] FDA and  has been authorized for detection and/or diagnosis of SARS-CoV-2 by FDA under an Emergency Use Authorization (EUA). This EUA will remain  in effect (meaning this test can be used) for the duration of the COVID-19 declaration under Section 564(b)(1) of the Act, 21 U.S.C. section 360bbb-3(b)(1), unless the authorization is terminated or revoked sooner.    Influenza A by PCR NEGATIVE NEGATIVE Final   Influenza B by PCR NEGATIVE NEGATIVE Final    Comment: (NOTE) The Xpert Xpress SARS-CoV-2/FLU/RSV assay is intended as an aid in  the diagnosis of influenza from Nasopharyngeal swab specimens and  should not be used as a sole basis for treatment. Nasal washings and  aspirates are unacceptable for Xpert Xpress SARS-CoV-2/FLU/RSV  testing. Fact Sheet for Patients: PinkCheek.be Fact Sheet for Healthcare Providers: GravelBags.it This test is not yet approved or cleared by the [place] FDA and  has been authorized for detection and/or diagnosis of SARS-CoV-2 by  FDA under an Emergency Use Authorization (EUA). This EUA will remain  in effect (meaning this test can be used) for the duration of the  Covid-19 declaration under Section 564(b)(1) of the Act, 21  U.S.C. section 360bbb-3(b)(1), unless the authorization is  terminated or revoked. Performed at [facility], [place] [address] [place], [place] [address]   Gastrointestinal Panel by PCR , Stool     Status: Abnormal   Collection Time: 04/09/20  4:39 AM   Specimen: Stool  Result Value Ref Range Status   Campylobacter species NOT DETECTED NOT DETECTED Final   Plesimonas  shigelloides NOT DETECTED NOT DETECTED Final   Salmonella species NOT DETECTED NOT DETECTED Final   Yersinia enterocolitica NOT DETECTED NOT DETECTED Final   Vibrio species NOT DETECTED NOT DETECTED Final   Vibrio cholerae NOT DETECTED NOT DETECTED Final   Enteroaggregative E coli (EAEC) NOT DETECTED NOT DETECTED Final   Enteropathogenic E coli (EPEC) NOT DETECTED NOT  DETECTED Final   Enterotoxigenic E coli (ETEC) NOT DETECTED NOT DETECTED Final   Shiga like toxin producing E coli (STEC) NOT DETECTED NOT DETECTED Final   Shigella/Enteroinvasive E coli (EIEC) NOT DETECTED NOT DETECTED Final   Cryptosporidium NOT DETECTED NOT DETECTED Final   Cyclospora cayetanensis NOT DETECTED NOT DETECTED Final   Entamoeba histolytica NOT DETECTED NOT DETECTED Final   Giardia lamblia NOT DETECTED NOT DETECTED Final   Adenovirus F40/41 NOT DETECTED NOT DETECTED Final   Astrovirus NOT DETECTED NOT DETECTED Final   Norovirus GI/GII DETECTED (A) NOT DETECTED Final    Comment: RESULT CALLED TO, READ BACK BY AND VERIFIED WITH: RYAN SANTIAGO 04/09/20 1546 KLW    Rotavirus A NOT DETECTED NOT DETECTED Final   Sapovirus (I, II, IV, and V) NOT DETECTED NOT DETECTED Final    Comment: Performed at [facility], [address] [place], [place] [address]     Labs: BNP (last 3 results) No results for input(s): BNP in the last 8760 hours. Basic Metabolic Panel: Recent Labs  Lab 04/08/20 1100 04/08/20 1955 04/09/20 0442 04/10/20 0804 04/11/20 0722  NA 134*  --  134* 138 137  K 3.5  --  3.8 4.2 4.2  CL 101  --  106 114* 109  CO2 20*  --  23 16* 20*  GLUCOSE 136*  --  101* 90 93  BUN 10  --  10 <5* <5*  CREATININE 1.10*  --  0.96 0.69 0.77  CALCIUM 8.5*  --  7.2* 7.1* 8.2*  MG  --  1.2* 2.4 1.9 1.8  PHOS  --  2.6  --   --   --    Liver Function Tests: Recent Labs  Lab 04/08/20 1100 04/09/20 0442  AST 37 27  ALT 26 21  ALKPHOS 59 48  BILITOT 1.3* 0.9  PROT 6.1* 5.2*  ALBUMIN 3.9 3.4*    Recent Labs  Lab 04/08/20 1100  LIPASE 21   No results for input(s): AMMONIA in the last 168 hours. CBC: Recent Labs  Lab 04/08/20 1100 04/09/20 0442 04/10/20 0804 04/11/20 0722  WBC 61.2* 49.7* 30.1* 28.8*  NEUTROABS  --  4.8  --   --   HGB 12.3 10.6* 10.0* 10.5*  HCT 37.5 33.2* 31.7* 33.0*  MCV 100.8* 104.7* 105.7* 102.5*  PLT 115* 74* 69* 84*   Cardiac Enzymes: No results for input(s): CKTOTAL, CKMB, CKMBINDEX, TROPONINI in the last 168 hours. BNP: Invalid input(s): POCBNP CBG: No results for input(s): GLUCAP in the last 168 hours. D-Dimer No results for input(s): DDIMER in the last 72 hours. Hgb A1c No results for input(s): HGBA1C in the last 72 hours. Lipid Profile No results for input(s): CHOL, HDL, LDLCALC, TRIG, CHOLHDL, LDLDIRECT in the last 72 hours. Thyroid function studies No results for input(s): TSH, T4TOTAL, T3FREE, THYROIDAB in the last 72 hours.  Invalid input(s): FREET3 Anemia work up No results for input(s): VITAMINB12, FOLATE, FERRITIN, TIBC, IRON, RETICCTPCT in the last 72 hours. Urinalysis    Component Value Date/Time   COLORURINE YELLOW 04/08/2020 1515   APPEARANCEUR HAZY (A) 04/08/2020 1515   LABSPEC 1.009 04/08/2020 1515   LABSPEC 1.010 10/08/2015 1610   PHURINE 6.0 04/08/2020 1515   GLUCOSEU NEGATIVE 04/08/2020 1515   GLUCOSEU Negative 10/08/2015 1610   HGBUR NEGATIVE 04/08/2020 [place] 04/08/2020 1515   BILIRUBINUR Negative 10/08/2015 1610   KETONESUR 5 (A) 04/08/2020 1515   PROTEINUR 30 (A) 04/08/2020 1515   UROBILINOGEN 0.2  10/08/2015 1610   NITRITE NEGATIVE 04/08/2020 1515   LEUKOCYTESUR NEGATIVE 04/08/2020 1515   LEUKOCYTESUR Small 10/08/2015 1610   Sepsis Labs Invalid input(s): PROCALCITONIN,  WBC,  LACTICIDVEN Microbiology Recent Results (from the past 240 hour(s))  C Difficile Quick Screen w PCR reflex     Status: None   Collection Time: 04/08/20  5:36 PM   Specimen: STOOL  Result Value Ref  Range Status   C Diff antigen NEGATIVE NEGATIVE Final   C Diff toxin NEGATIVE NEGATIVE Final   C Diff interpretation No C. difficile detected.  Final    Comment: Performed at [facility], [place] [address], [place], [place] [address]  Respiratory Panel by RT PCR (Flu A&B, Covid) - Nasopharyngeal Swab     Status: None   Collection Time: 04/08/20  7:48 PM   Specimen: Nasopharyngeal Swab  Result Value Ref Range Status   SARS Coronavirus 2 by RT PCR NEGATIVE NEGATIVE Final    Comment: (NOTE) SARS-CoV-2 target nucleic acids are NOT DETECTED. The SARS-CoV-2 RNA is generally detectable in upper respiratoy specimens during the acute phase of infection. The lowest concentration of SARS-CoV-2 viral copies this assay can detect is 131 copies/mL. A negative result does not preclude SARS-Cov-2 infection and should not be used as the sole basis for treatment or other patient management decisions. A negative result may occur with  improper specimen collection/handling, submission of specimen other than nasopharyngeal swab, presence of viral mutation(s) within the areas targeted by this assay, and inadequate number of viral copies (<131 copies/mL). A negative result must be combined with clinical observations, patient history, and epidemiological information. The expected result is Negative. Fact Sheet for Patients:  PinkCheek.be Fact Sheet for Healthcare Providers:  GravelBags.it This test is not yet ap proved or cleared by the [place] FDA and  has been authorized for detection and/or diagnosis of SARS-CoV-2 by FDA under an Emergency Use Authorization (EUA). This EUA will remain  in effect (meaning this test can be used) for the duration of the COVID-19 declaration under Section 564(b)(1) of the Act, 21 U.S.C. section 360bbb-3(b)(1), unless the authorization is terminated or revoked sooner.    Influenza A by PCR NEGATIVE NEGATIVE  Final   Influenza B by PCR NEGATIVE NEGATIVE Final    Comment: (NOTE) The Xpert Xpress SARS-CoV-2/FLU/RSV assay is intended as an aid in  the diagnosis of influenza from Nasopharyngeal swab specimens and  should not be used as a sole basis for treatment. Nasal washings and  aspirates are unacceptable for Xpert Xpress SARS-CoV-2/FLU/RSV  testing. Fact Sheet for Patients: PinkCheek.be Fact Sheet for Healthcare Providers: GravelBags.it This test is not yet approved or cleared by the [place] FDA and  has been authorized for detection and/or diagnosis of SARS-CoV-2 by  FDA under an Emergency Use Authorization (EUA). This EUA will remain  in effect (meaning this test can be used) for the duration of the  Covid-19 declaration under Section 564(b)(1) of the Act, 21  U.S.C. section 360bbb-3(b)(1), unless the authorization is  terminated or revoked. Performed at [facility], [place] [address] [place], [place] [address]   Gastrointestinal Panel by PCR , Stool     Status: Abnormal   Collection Time: 04/09/20  4:39 AM   Specimen: Stool  Result Value Ref Range Status   Campylobacter species NOT DETECTED NOT DETECTED Final   Plesimonas shigelloides NOT DETECTED NOT DETECTED Final   Salmonella species NOT DETECTED NOT DETECTED Final   Yersinia enterocolitica NOT  DETECTED NOT DETECTED Final   Vibrio species NOT DETECTED NOT DETECTED Final   Vibrio cholerae NOT DETECTED NOT DETECTED Final   Enteroaggregative E coli (EAEC) NOT DETECTED NOT DETECTED Final   Enteropathogenic E coli (EPEC) NOT DETECTED NOT DETECTED Final   Enterotoxigenic E coli (ETEC) NOT DETECTED NOT DETECTED Final   Shiga like toxin producing E coli (STEC) NOT DETECTED NOT DETECTED Final   Shigella/Enteroinvasive E coli (EIEC) NOT DETECTED NOT DETECTED Final   Cryptosporidium NOT DETECTED NOT DETECTED Final   Cyclospora cayetanensis NOT DETECTED NOT DETECTED Final    Entamoeba histolytica NOT DETECTED NOT DETECTED Final   Giardia lamblia NOT DETECTED NOT DETECTED Final   Adenovirus F40/41 NOT DETECTED NOT DETECTED Final   Astrovirus NOT DETECTED NOT DETECTED Final   Norovirus GI/GII DETECTED (A) NOT DETECTED Final    Comment: RESULT CALLED TO, READ BACK BY AND VERIFIED WITH: RYAN SANTIAGO 04/09/20 1546 KLW    Rotavirus A NOT DETECTED NOT DETECTED Final   Sapovirus (I, II, IV, and V) NOT DETECTED NOT DETECTED Final    Comment: Performed at [facility], [place] [place], [place] [address]     Patient was seen and examined on the day of discharge and was found to be in stable condition. Time coordinating discharge: 25 minutes including assessment and coordination of care, as well as examination of the patient.   SIGNED:  Dessa Phi, DO [facility] 04/11/2020, 10:18 AM

## 2020-04-13 ENCOUNTER — Telehealth: Payer: Self-pay | Admitting: *Deleted

## 2020-04-13 NOTE — Telephone Encounter (Signed)
Called patient on 04/13/2020 , 10:43 AM in an attempt to reach the patient for a hospital follow up.   Admit date: 04/08/20 Discharge: 04/11/20   She does not have any questions or concerns about medications from the hospital admission. The patient's medications were reviewed over the phone, they were counseled to bring in all current medications to the hospital follow up visit.   I advised the patient to call if any questions or concerns arise about the hospital admission or medications. Patient states her feet and legs are swollen,but is not currently taking a diuretic, due to possible dehydration. She was advised to drink plenty of fluids and elevate her feet and legs. She was advised to continue a soft bland diet, due to possibility of having diarrhea ,but stressed fluid intake.  Home health was not started in the hospital.  All questions were answered and a follow up appointment was made. Patient has an appointment 04/22/2020 with Irving Shows. She was advised to call back, if she develops any concerns.  Prior to Admission medications   Medication Sig Start Date End Date Taking? Authorizing Provider  ALPRAZolam (XANAX) 1 MG tablet Take 1/2 - 1 tablet   2 - 3 x /day ONLY if needed for Anxiety Attack &  limit to 5 days /week to avoid Addiction & Dementia Patient taking differently: Take 0.5-1 mg by mouth See admin instructions. Take 0.5-1 mg by mouth two to three times a day only as needed for anxiety attacks- limit to 5 days/week to avoid addiction and dementia 02/06/20   [name], MD  Ascorbic Acid (VITAMIN C) 1000 MG tablet Take 1,500 mg by mouth daily.     [provider]  bisoprolol-hydrochlorothiazide (ZIAC) 10-6.25 MG tablet Take 1 tablet Daily for BP Patient taking differently: Take 1 tablet by mouth daily.  06/24/19   [name], MD  cetirizine (ZYRTEC) 10 MG tablet Take 10 mg by mouth at bedtime.     [provider]  Cholecalciferol (VITAMIN D3) 250 MCG  (10000 UT) TABS Take 10,000 Units by mouth in the morning.    [provider]  Cyanocobalamin (VITAMIN B12) 500 MCG TABS Take 500 mcg by mouth in the morning.    [provider]  dicyclomine (BENTYL) 10 MG capsule Take 1 capsule (10 mg total) by mouth 3 (three) times daily as needed for spasms. 04/11/20   Dessa Phi, DO  ezetimibe (ZETIA) 10 MG tablet Take 1 tablet Daily for Cholesterol Patient taking differently: Take 10 mg by mouth at bedtime. Take 1 tablet Daily for Cholesterol 05/29/19   [name], MD  FLUoxetine (PROZAC) 20 MG capsule Take 1 capsule (20 mg total) by mouth daily. 03/11/20 03/11/21  Vicie Mutters, PA-C  hyoscyamine (LEVSIN SL) 0.125 MG SL tablet Place 0.125 mg under the tongue every 4 (four) hours as needed for cramping.    [provider]  levothyroxine (SYNTHROID) 50 MCG tablet Take 1 tablet (50 mcg total) by mouth daily. 03/13/20 03/13/21  Vicie Mutters, PA-C  loperamide (IMODIUM A-D) 2 MG tablet Take 2 mg by mouth 4 (four) times daily as needed for diarrhea or loose stools.    [provider]  Olopatadine HCl 0.2 % SOLN Place 1 drop into both eyes daily as needed (for seasonal allergies/itching).     [provider]  omeprazole (PRILOSEC) 40 MG capsule Take 1 capsule Daily twice daily Prevent Heartburn & Indigestion Patient taking differently: Take 40 mg by mouth daily before breakfast.  03/17/20  Vicie Mutters, PA-C  Probiotic Product (PROBIOTIC PO) Take 1 capsule by mouth in the morning.     [provider]  RESTASIS 0.05 % ophthalmic emulsion Place 1 drop into both eyes 2 (two) times daily as needed (for seasonal allergies/irritation).  08/19/19   [provider]

## 2020-04-20 ENCOUNTER — Ambulatory Visit: Payer: 59 | Admitting: Internal Medicine

## 2020-04-20 NOTE — Progress Notes (Signed)
Hospital follow up  Assessment and Plan: Hospital visit follow up for  Rebecca Bond was seen today for hospitalization follow-up.  Diagnoses and all orders for this visit:  Diarrhea, unspecified type -     Magnesium; Future Does not want a lab draw today Benign AB, vital normal/improved from the hospital Continue to push fluids, add in electrolytes  Epigastric pain -     Ambulatory referral to Gastroenterology levsin PRN Avoid milk products due to recent norovirus Bland foods May need repeat EGD    All medications were reviewed with patient and family and fully reconciled. All questions answered fully, and patient and family members were encouraged to call the office with any further questions or concerns. Discussed goal to avoid readmission related to this diagnosis.  Medications Discontinued During This Encounter  Medication Reason  . dicyclomine (BENTYL) 10 MG capsule Patient Preference    Over 40 minutes of exam, counseling, chart review, and complex, high/moderate level critical decision making was performed this visit.   Future Appointments  Date Time Provider [place]  04/27/2020  2:30 PM [facility] 3 [facility] None  04/27/2020  3:00 PM Brunetta Genera, MD [facility] None  06/16/2020  3:30 PM [name], MD [facility] None  11/02/2020 11:00 AM [name], MD [facility] None     HPI 74 y.o.female with history of ofanemia, anxiety/depression, osteoarthritis, CLL, GERD, history of sinus headaches and recurrent sinus infections, history of urolithiasis, hypertension, OSA, history of pneumonia, prediabetes presents for follow up for transition from recent hospitalization or SNIF stay. Admit date to the hospital was 04/08/20, patient was discharged from the hospital on 04/11/20 and our clinical staff contacted the office the day after discharge to set up a follow up appointment. The discharge summary, medications, and diagnostic test results were  reviewed before meeting with the patient. The patient was admitted for:   Due to continued weakness, poor tolerance to PO intake, patient was given IVF and admitted to the hospital for suspected gastroenteritis. GI PCR panel was positive for norovirus.  CT AB without contrast was normal 04/2020  She states she is still weak, some diarrhea/loose stools, did not drive herself due to feeling poorly. Still having AB cramping, epigastric pain just with food, any food, nothing better, nothing worse, no radiation, not able to eat much still. Eating baked potatoes, soup. She is having some food/pills feel "stuck" in her throat but she is not choking on foods, pills, liquids.  Colonoscopy 11/2016 EGD 11/2016  Lab Results  Component Value Date   CREATININE 0.77 04/11/2020   BUN <5 (L) 04/11/2020   NA 137 04/11/2020   K 4.2 04/11/2020   CL 109 04/11/2020   CO2 20 (L) 04/11/2020    Home health is not involved.   BMI is Body mass index is 34.5 kg/m., She is down 10 lbs Wt Readings from Last 3 Encounters:  04/22/20 201 lb (91.2 kg)  04/08/20 211 lb 13.8 oz (96.1 kg)  03/11/20 211 lb 12.8 oz (96.1 kg)   Blood pressure 126/84, pulse 82, temperature (!) 97.5 F (36.4 C), weight 201 lb (91.2 kg), SpO2 97 %.   Images while in the hospital: CT ABDOMEN PELVIS WO CONTRAST  Result Date: 04/08/2020 CLINICAL DATA:  Abdominal pain with diarrhea EXAM: CT ABDOMEN AND PELVIS WITHOUT CONTRAST TECHNIQUE: Multidetector CT imaging of the abdomen and pelvis was performed following the standard protocol without IV contrast. COMPARISON:  2017 FINDINGS: Lower chest: Mild bibasilar atelectasis. Hepatobiliary: Unchanged small cyst of the  right hepatic lobe. Post cholecystectomy. No unexpected biliary dilatation. Pancreas: Unremarkable. Spleen: Unremarkable. Adrenals/Urinary Tract: Adrenals and kidneys are unremarkable. The bladder is minimally distended but unremarkable. Stomach/Bowel: Stomach is within normal limits.  The bowel is normal in caliber. Appendix is not visualized and probably surgically absent. Vascular/Lymphatic: Minor aortic atherosclerosis. No enlarged lymph nodes identified. Reproductive: Status post hysterectomy. No adnexal masses. Other: No ascites. Musculoskeletal: Degenerative changes of the lumbar spine. IMPRESSION: No acute abnormality or findings to account for reported symptoms. Electronically Signed   By: Macy Mis M.D.   On: 04/08/2020 16:31     Current Outpatient Medications (Endocrine & Metabolic):  .  levothyroxine (SYNTHROID) 50 MCG tablet, Take 1 tablet (50 mcg total) by mouth daily.  Current Outpatient Medications (Cardiovascular):  .  bisoprolol-hydrochlorothiazide (ZIAC) 10-6.25 MG tablet, Take 1 tablet Daily for BP (Patient taking differently: Take 1 tablet by mouth daily. ) .  ezetimibe (ZETIA) 10 MG tablet, Take 1 tablet Daily for Cholesterol (Patient taking differently: Take 10 mg by mouth at bedtime. Take 1 tablet Daily for Cholesterol)  Current Outpatient Medications (Respiratory):  .  cetirizine (ZYRTEC) 10 MG tablet, Take 10 mg by mouth at bedtime.    Current Outpatient Medications (Hematological):  [name] Kitchen  Cyanocobalamin (VITAMIN B12) 500 MCG TABS, Take 500 mcg by mouth in the morning.  Current Outpatient Medications (Other):  [name] Kitchen  ALPRAZolam (XANAX) 1 MG tablet, Take 1/2 - 1 tablet   2 - 3 x /day ONLY if needed for Anxiety Attack &  limit to 5 days /week to avoid Addiction & Dementia (Patient taking differently: Take 0.5-1 mg by mouth See admin instructions. Take 0.5-1 mg by mouth two to three times a day only as needed for anxiety attacks- limit to 5 days/week to avoid addiction and dementia) .  Ascorbic Acid (VITAMIN C) 1000 MG tablet, Take 1,500 mg by mouth daily.  .  Cholecalciferol (VITAMIN D3) 250 MCG (10000 UT) TABS, Take 10,000 Units by mouth in the morning. [name] Kitchen  FLUoxetine (PROZAC) 20 MG capsule, Take 1 capsule (20 mg total) by mouth daily. .  hyoscyamine  (LEVSIN SL) 0.125 MG SL tablet, Place 0.125 mg under the tongue every 4 (four) hours as needed for cramping. .  loperamide (IMODIUM A-D) 2 MG tablet, Take 2 mg by mouth 4 (four) times daily as needed for diarrhea or loose stools. .  Olopatadine HCl 0.2 % SOLN, Place 1 drop into both eyes daily as needed (for seasonal allergies/itching).  [name] Kitchen  omeprazole (PRILOSEC) 40 MG capsule, Take 1 capsule Daily twice daily Prevent Heartburn & Indigestion (Patient taking differently: Take 40 mg by mouth daily before breakfast. ) .  Probiotic Product (PROBIOTIC PO), Take 1 capsule by mouth in the morning.  .  RESTASIS 0.05 % ophthalmic emulsion, Place 1 drop into both eyes 2 (two) times daily as needed (for seasonal allergies/irritation).   Past Medical History:  Diagnosis Date  . Anemia   . Anxiety   . Arthritis   . 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 Depression   . GERD (gastroesophageal reflux disease)   . Headache    "sinus headaches"  . History of kidney stones   . Hypertension   . OSA (obstructive sleep apnea)   . Pneumonia   . Prediabetes   . Recurrent sinus infections 08/02/2012     Allergies  Allergen Reactions  . Hyzaar [Losartan Potassium-Hctz] Swelling and Other (See Comments)    "messed up my sodium counts"and swelled  lips  . Sudafed [Pseudoephedrine Hcl] Palpitations  . Levaquin [Levofloxacin In D5w] Diarrhea and Nausea Only  . Acyclovir And Related Other (See Comments)    Reaction not recalled  . Biaxin [Clarithromycin]     GI Upset  . Cantaloupe Extract Allergy Skin Test Other (See Comments)    Nasal congestion, headaches, and sneezing  . Celexa [Citalopram] Other (See Comments)    Reaction not recalled  . Flexeril [Cyclobenzaprine]     "zombie-like" feeling  . Fosamax [Alendronate Sodium]     GI upset  . Iohexol Hives and Other (See Comments)    Patient stated she broke out in hive 20 yrs ago from IV contrast     . Losartan Swelling and Other (See  Comments)    Angioedema  . Meloxicam     GI upset  . Norvasc [Amlodipine] Swelling and Other (See Comments)    Ankles swell  . Other Nausea Only and Other (See Comments)    Iceburg lettuce- "made me dreadfully nauseous" Raw mushrooms only-  "made me dreadfully nauseous" (cooked ones are tolerated)  . Pseudoephedrine     Palpitations  . Zoloft [Sertraline Hcl] Other (See Comments)    Has no emotions at all     ROS: all negative except above.   Physical Exam: Filed Weights   04/22/20 1528  Weight: 201 lb (91.2 kg)   BP 126/84   Pulse 82   Temp (!) 97.5 F (36.4 C)   Wt 201 lb (91.2 kg)   SpO2 97%   BMI 34.50 kg/m  General Appearance: Well nourished, in no apparent distress, no diaphroesis Eyes: PERRLA, EOMs, conjunctiva no swelling or erythema Sinuses:  Frontal/maxillary tenderness ENT/Mouth: Ext aud canals clear, TMs without erythema, bulging. No erythema, swelling, or exudate on post pharynx.  Tonsils not swollen or erythematous. Hearing normal.  Neck: Supple, thyroid normal.  Respiratory: Respiratory effort normal, BS equal bilaterally without rales, rhonchi, wheezing or stridor.  Cardio: RRR with no MRGs. Brisk peripheral pulses with mild edema.  Abdomen: Soft, + BS, obese, diffusely overly tender to palpation,  without rebound, hernias, masses. Lymphatics: Non tender without lymphadenopathy.  Musculoskeletal: Full ROM, 5/5 strength, Normal gait, Strength is normal and symmetric in arms. Skin:  Warm, dry without rashes, lesions, ecchymosis.  Neuro: Cranial nerves intact. Normal muscle tone, no cerebellar symptoms. Psych: Awake and oriented X 3, normal affect, appears anxious, Insight and Judgment appropriate.     Vicie Mutters, PA-C 3:41 PM [facility]

## 2020-04-22 ENCOUNTER — Ambulatory Visit: Payer: Medicare Other | Admitting: Physician Assistant

## 2020-04-22 ENCOUNTER — Encounter: Payer: Self-pay | Admitting: Physician Assistant

## 2020-04-22 ENCOUNTER — Other Ambulatory Visit: Payer: Self-pay

## 2020-04-22 VITALS — BP 126/84 | HR 82 | Temp 97.5°F | Wt 201.0 lb

## 2020-04-22 DIAGNOSIS — R1013 Epigastric pain: Secondary | ICD-10-CM

## 2020-04-22 DIAGNOSIS — R197 Diarrhea, unspecified: Secondary | ICD-10-CM | POA: Diagnosis not present

## 2020-04-22 NOTE — Patient Instructions (Addendum)
Avoid milk, cheese, yogurt, etc for at least 4-6 weeks and see how you do  Try to do water 64 oz a day Also can add on magnesium or do the magnesium cream Add on small amount of Pedialyte or sugar free electrolyte drink   Will refer you back to Dr. Hilarie Fredrickson for evaluation   Food Choices to Help Relieve Diarrhea, Adult When you have diarrhea, the foods you eat and your eating habits are very important. Choosing the right foods and drinks can help:  Relieve diarrhea.  Replace lost fluids and nutrients.  Prevent dehydration. What general guidelines should I follow?  Relieving diarrhea  Choose foods with less than 2 g or .07 oz. of fiber per serving.  Limit fats to less than 8 tsp (38 g or 1.34 oz.) a day.  Avoid the following: ? Foods and beverages sweetened with high-fructose corn syrup, honey, or sugar alcohols such as xylitol, sorbitol, and mannitol. ? Foods that contain a lot of fat or sugar. ? Fried, greasy, or spicy foods. ? High-fiber grains, breads, and cereals. ? Raw fruits and vegetables.  Eat foods that are rich in probiotics. These foods include dairy products such as yogurt and fermented milk products. They help increase healthy bacteria in the stomach and intestines (gastrointestinal tract, or GI tract).  If you have lactose intolerance, avoid dairy products. These may make your diarrhea worse.  Take medicine to help stop diarrhea (antidiarrheal medicine) only as told by your health care provider. Replacing nutrients  Eat small meals or snacks every 3-4 hours.  Eat bland foods, such as white rice, toast, or baked potato, until your diarrhea starts to get better. Gradually reintroduce nutrient-rich foods as tolerated or as told by your health care provider. This includes: ? Well-cooked protein foods. ? Peeled, seeded, and soft-cooked fruits and vegetables. ? Low-fat dairy products.  Take vitamin and mineral supplements as told by your health care  provider. Preventing dehydration  Start by sipping water or a special solution to prevent dehydration (oral rehydration solution, ORS). Urine that is clear or pale yellow means that you are getting enough fluid.  Try to drink at least 8-10 cups of fluid each day to help replace lost fluids.  You may add other liquids in addition to water, such as clear juice or decaffeinated sports drinks, as tolerated or as told by your health care provider.  Avoid drinks with caffeine, such as coffee, tea, or soft drinks.  Avoid alcohol. What foods are recommended?     The items listed may not be a complete list. Talk with your health care provider about what dietary choices are best for you. Grains White rice. White, [place], or pita breads (fresh or toasted), including plain rolls, buns, or bagels. White pasta. Saltine, soda, or graham crackers. Pretzels. Low-fiber cereal. Cooked cereals made with water (such as cornmeal, farina, or cream cereals). Plain muffins. Matzo. Melba toast. Zwieback. Vegetables Potatoes (without the skin). Most well-cooked and canned vegetables without skins or seeds. Tender lettuce. Fruits Apple sauce. Fruits canned in juice. Cooked apricots, cherries, grapefruit, peaches, pears, or plums. Fresh bananas and cantaloupe. Meats and other protein foods Baked or boiled chicken. Eggs. Tofu. Fish. Seafood. Smooth nut butters. Ground or well-cooked tender beef, ham, veal, lamb, pork, or poultry. Dairy Plain yogurt, kefir, and unsweetened liquid yogurt. Lactose-free milk, buttermilk, skim milk, or soy milk. Low-fat or nonfat hard cheese. Beverages Water. Low-calorie sports drinks. Fruit juices without pulp. Strained tomato and vegetable juices. Decaffeinated teas. Sugar-free  beverages not sweetened with sugar alcohols. Oral rehydration solutions, if approved by your health care provider. Seasoning and other foods Bouillon, broth, or soups made from recommended foods. What foods are  not recommended? The items listed may not be a complete list. Talk with your health care provider about what dietary choices are best for you. Grains Whole grain, whole wheat, bran, or rye breads, rolls, pastas, and crackers. Wild or brown rice. Whole grain or bran cereals. Barley. Oats and oatmeal. Corn tortillas or taco shells. Granola. Popcorn. Vegetables Raw vegetables. Fried vegetables. Cabbage, broccoli, Brussels sprouts, artichokes, baked beans, beet greens, corn, kale, legumes, peas, sweet potatoes, and yams. Potato skins. Cooked spinach and cabbage. Fruits Dried fruit, including raisins and dates. Raw fruits. Stewed or dried prunes. Canned fruits with syrup. Meat and other protein foods Fried or fatty meats. Deli meats. Chunky nut butters. Nuts and seeds. Beans and lentils. Berniece Salines. Hot dogs. Sausage. Dairy High-fat cheeses. Whole milk, chocolate milk, and beverages made with milk, such as milk shakes. Half-and-half. Cream. sour cream. Ice cream. Beverages Caffeinated beverages (such as coffee, tea, soda, or energy drinks). Alcoholic beverages. Fruit juices with pulp. Prune juice. Soft drinks sweetened with high-fructose corn syrup or sugar alcohols. High-calorie sports drinks. Fats and oils Butter. Cream sauces. Margarine. Salad oils. Plain salad dressings. Olives. Avocados. Mayonnaise. Sweets and desserts Sweet rolls, doughnuts, and sweet breads. Sugar-free desserts sweetened with sugar alcohols such as xylitol and sorbitol. Seasoning and other foods Honey. Hot sauce. Chili powder. Gravy. Cream-based or milk-based soups. Pancakes and waffles. Summary  When you have diarrhea, the foods you eat and your eating habits are very important.  Make sure you get at least 8-10 cups of fluid each day, or enough to keep your urine clear or pale yellow.  Eat bland foods and gradually reintroduce healthy, nutrient-rich foods as tolerated, or as told by your health care provider.  Avoid  high-fiber, fried, greasy, or spicy foods. This information is not intended to replace advice given to you by your health care provider. Make sure you discuss any questions you have with your health care provider. Document Revised: 03/14/2019 Document Reviewed: 11/18/2016 Elsevier Patient Education  [place].

## 2020-04-27 ENCOUNTER — Other Ambulatory Visit: Payer: Self-pay | Admitting: *Deleted

## 2020-04-27 ENCOUNTER — Other Ambulatory Visit: Payer: Self-pay

## 2020-04-27 ENCOUNTER — Inpatient Hospital Stay: Payer: Medicare Other | Admitting: Hematology

## 2020-04-27 ENCOUNTER — Inpatient Hospital Stay: Payer: Medicare Other | Attending: Hematology

## 2020-04-27 VITALS — BP 145/63 | HR 69 | Temp 97.5°F | Resp 18 | Ht 64.0 in | Wt 202.7 lb

## 2020-04-27 DIAGNOSIS — Z85828 Personal history of other malignant neoplasm of skin: Secondary | ICD-10-CM | POA: Insufficient documentation

## 2020-04-27 DIAGNOSIS — D649 Anemia, unspecified: Secondary | ICD-10-CM

## 2020-04-27 DIAGNOSIS — D801 Nonfamilial hypogammaglobulinemia: Secondary | ICD-10-CM | POA: Insufficient documentation

## 2020-04-27 DIAGNOSIS — C9110 Chronic lymphocytic leukemia of B-cell type not having achieved remission: Secondary | ICD-10-CM

## 2020-04-27 DIAGNOSIS — D696 Thrombocytopenia, unspecified: Secondary | ICD-10-CM | POA: Insufficient documentation

## 2020-04-27 LAB — CMP (CANCER CENTER ONLY)
ALT: 23 U/L (ref 0–44)
AST: 28 U/L (ref 15–41)
Albumin: 4.3 g/dL (ref 3.5–5.0)
Alkaline Phosphatase: 69 U/L (ref 38–126)
Anion gap: 11 (ref 5–15)
BUN: 6 mg/dL — ABNORMAL LOW (ref 8–23)
CO2: 26 mmol/L (ref 22–32)
Calcium: 9.2 mg/dL (ref 8.9–10.3)
Chloride: 93 mmol/L — ABNORMAL LOW (ref 98–111)
Creatinine: 0.82 mg/dL (ref 0.44–1.00)
GFR, Est AFR Am: >60 mL/min (ref >60)
GFR, Estimated: >60 mL/min (ref >60)
Glucose, Bld: 116 mg/dL — ABNORMAL HIGH (ref 70–99)
Potassium: 4.3 mmol/L (ref 3.5–5.1)
Sodium: 130 mmol/L — ABNORMAL LOW (ref 135–145)
Total Bilirubin: 0.7 mg/dL (ref 0.3–1.2)
Total Protein: 6.7 g/dL (ref 6.5–8.1)

## 2020-04-27 LAB — CBC WITH DIFFERENTIAL/PLATELET
Abs Immature Granulocytes: 0.23 10*3/uL — ABNORMAL HIGH (ref 0.00–0.07)
Basophils Absolute: 0.1 10*3/uL (ref 0.0–0.1)
Basophils Relative: 0 %
Eosinophils Absolute: 0.4 10*3/uL (ref 0.0–0.5)
Eosinophils Relative: 1 %
HCT: 36.2 % (ref 36.0–46.0)
Hemoglobin: 12.1 g/dL (ref 12.0–15.0)
Immature Granulocytes: 0 %
Lymphocytes Relative: 87 %
Lymphs Abs: 57 10*3/uL — ABNORMAL HIGH (ref 0.7–4.0)
MCH: 32.2 pg (ref 26.0–34.0)
MCHC: 33.4 g/dL (ref 30.0–36.0)
MCV: 96.3 fL (ref 80.0–100.0)
Monocytes Absolute: 2.7 10*3/uL — ABNORMAL HIGH (ref 0.1–1.0)
Monocytes Relative: 4 %
Neutro Abs: 5 10*3/uL (ref 1.7–7.7)
Neutrophils Relative %: 8 %
Platelets: 106 10*3/uL — ABNORMAL LOW (ref 150–400)
RBC: 3.76 MIL/uL — ABNORMAL LOW (ref 3.87–5.11)
RDW: 12.8 % (ref 11.5–15.5)
WBC: 65.3 10*3/uL (ref 4.0–10.5)
nRBC: 0 % (ref 0.0–0.2)

## 2020-04-27 LAB — RETICULOCYTES
Immature Retic Fract: 21.3 % — ABNORMAL HIGH (ref 2.3–15.9)
RBC.: 3.66 MIL/uL — ABNORMAL LOW (ref 3.87–5.11)
Retic Count, Absolute: 83.8 10*3/uL (ref 19.0–186.0)
Retic Ct Pct: 2.3 % (ref 0.4–3.1)

## 2020-04-27 LAB — LACTATE DEHYDROGENASE: LDH: 182 U/L (ref 98–192)

## 2020-04-27 LAB — IMMATURE PLATELET FRACTION: Immature Platelet Fraction: 1.8 % (ref 1.2–8.6)

## 2020-04-27 NOTE — Progress Notes (Signed)
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  INTERVAL HISTORY:  KARMAN CASIDA returns today for management and evaluation of [name]. The patient's last visit with [place] was on 02/19/2020. The pt reports that she is doing well overall.  The pt reports that she was admitted to the hospital in early May due to nausea, vomiting, and diarrhea caused by Norovirus. She believes that she contracted the virus from her young grandson. Pt is no longer experiencing diarrhea and is currently constipated. She has an appointment with a Gastroenterologist in August and plans on having a repeat Colonoscopy and Upper Endoscopy. She denies any other new concerns.   Lab results today (04/27/20) of CBC w/diff and CMP is as follows: all values are WNL except for WBC at 65.3K, RBC at 3.76, PLT at 106K, Lymphs Abs at 57.0K, Mono Abs at 2.7K, WBC Morphology shows "Variant Lymphs Present", Abs Immature Granulocytes at 0.23K, Smudge Cells are "Present", Sodium at 130, Chloride at  93, Glucose at 116, BUN at 6. 04/27/2020 Haptoglobin is in progress 04/27/2020 Immature PLT Fraction at 1.8 04/27/2020 LDH at 182 04/27/2020 Reticulocytes at [place] at 2.3, RBC at 3.66, Retic Ct Abs at 83.8, Immature Retic Fract at 21.3   On review of systems, pt reports constipation and denies diarrhea, nausea, vomiting and any other symptoms.    REVIEW OF SYSTEMS:   A 10+ POINT REVIEW OF SYSTEMS WAS OBTAINED including neurology, dermatology, psychiatry, cardiac, respiratory, lymph, extremities, GI, GU, Musculoskeletal, constitutional , breasts, reproductive, HEENT.  All pertinent positives are noted in the HPI.  All others are negative.   Past Medical History:  Diagnosis Date  . Anemia   . Anxiety   . Arthritis   . CLL (chronic lymphocytic leukemia) ([place]) dx'd ~ 2015  . Depression   . GERD (gastroesophageal reflux disease)   . Headache    "sinus headaches"  . History of kidney stones   . Hypertension   . OSA (obstructive sleep apnea)   . Pneumonia   . Prediabetes   . Recurrent sinus infections 08/02/2012    . Past Surgical History:  Procedure Laterality Date  . ABDOMINAL HYSTERECTOMY  1980's   "endometrosis"  . APPENDECTOMY    . BREAST SURGERY    . CATARACT EXTRACTION, BILATERAL    . CHOLECYSTECTOMY  1985  . FUNCTIONAL ENDOSCOPIC SINUS SURGERY  1990's   "cause I kept having sinus infections"  . immunoglobulin treatment  2017  . LAPAROSCOPIC APPENDECTOMY N/A 06/04/2014   Procedure: APPENDECTOMY LAPAROSCOPIC;  Surgeon: Zenovia Jarred, MD;  Location: [facility];  Service: General;  Laterality: N/A;  . LUMBAR  LAMINECTOMY/DECOMPRESSION MICRODISCECTOMY Left 11/01/2017   Procedure: Left Lumbar Four-Five Extraforaminal Microdiscectomy;  Surgeon: Eustace Moore, MD;  Location: [facility];  Service: Neurosurgery;  Laterality: Left;  . LYMPH NODE BIOPSY     "determined I had CLL"  . SHOULDER ARTHROSCOPY Right   . TEMPOROMANDIBULAR JOINT ARTHROPLASTY  1980's  . TONSILLECTOMY AND  ADENOIDECTOMY  1950's    . Social History   Tobacco Use  . Smoking status: Former Smoker    Packs/day: 0.75    Years: 4.00    Pack years: 3.00    Types: Cigarettes    Quit date: 12/05/1968    Years since quitting: 51.4  . Smokeless tobacco: Never Used  Substance Use Topics  . Alcohol use: Yes    Alcohol/week: 7.0 standard drinks    Types: 7 Glasses of wine per week  . Drug use: No    ALLERGIES:  is allergic to hyzaar [losartan potassium-hctz]; sudafed [pseudoephedrine hcl]; levaquin [levofloxacin in d5w]; acyclovir and related; biaxin [clarithromycin]; cantaloupe extract allergy skin test; celexa [citalopram]; flexeril [cyclobenzaprine]; fosamax [alendronate sodium]; iohexol; losartan; meloxicam; norvasc [amlodipine]; other; pseudoephedrine; and zoloft [sertraline hcl].  MEDICATIONS:  Current Outpatient Medications  Medication Sig Dispense Refill  . ALPRAZolam (XANAX) 1 MG tablet Take 1/2 - 1 tablet   2 - 3 x /day ONLY if needed for Anxiety Attack &  limit to 5 days /week to avoid Addiction & Dementia (Patient taking differently: Take 0.5-1 mg by mouth See admin instructions. Take 0.5-1 mg by mouth two to three times a day only as needed for anxiety attacks- limit to 5 days/week to avoid addiction and dementia) 90 tablet 0  . Ascorbic Acid (VITAMIN C) 1000 MG tablet Take 1,500 mg by mouth daily.     . bisoprolol-hydrochlorothiazide (ZIAC) 10-6.25 MG tablet Take 1 tablet Daily for BP (Patient taking differently: Take 1 tablet by mouth daily. ) 90 tablet 3  . cetirizine (ZYRTEC) 10 MG tablet Take 10 mg by mouth at bedtime.     . Cholecalciferol (VITAMIN D3) 250 MCG (10000 UT) TABS Take 10,000 Units by mouth in the morning.    . Cyanocobalamin (VITAMIN B12) 500 MCG TABS Take 500 mcg by mouth in the morning.    . ezetimibe (ZETIA) 10 MG tablet Take 1 tablet Daily for Cholesterol (Patient taking differently: Take 10 mg by mouth at bedtime. Take 1 tablet Daily for Cholesterol) 90 tablet 3    . FLUoxetine (PROZAC) 20 MG capsule Take 1 capsule (20 mg total) by mouth daily. 30 capsule 2  . hyoscyamine (LEVSIN SL) 0.125 MG SL tablet Place 0.125 mg under the tongue every 4 (four) hours as needed for cramping.    [name] Kitchen levothyroxine (SYNTHROID) 50 MCG tablet Take 1 tablet (50 mcg total) by mouth daily. 30 tablet 11  . loperamide (IMODIUM A-D) 2 MG tablet Take 2 mg by mouth 4 (four) times daily as needed for diarrhea or loose stools.    . Olopatadine HCl 0.2 % SOLN Place 1 drop into both eyes daily as needed (for seasonal allergies/itching).     [name] Kitchen omeprazole (PRILOSEC) 40 MG capsule Take 1 capsule Daily twice daily Prevent Heartburn & Indigestion (Patient taking differently: Take 40 mg by mouth daily before breakfast. ) 180 capsule 3  . Probiotic Product (PROBIOTIC PO) Take 1 capsule by mouth in the morning.     . RESTASIS 0.05 % ophthalmic emulsion Place 1 drop into both eyes 2 (two) times daily as needed (for seasonal allergies/irritation).  No current facility-administered medications for this visit.    PHYSICAL EXAMINATION: ECOG PERFORMANCE STATUS: 1 - Symptomatic but completely ambulatory  Vitals:   04/27/20 1512  BP: (!) 145/63  Pulse: 69  Resp: 18  Temp: (!) 97.5 F (36.4 C)  SpO2: 97%    Filed Weights   04/27/20 1512  Weight: 202 lb 11.2 oz (91.9 kg)   .Body mass index is 34.79 kg/m.   Exam was given in a chair   GENERAL:alert, in no acute distress and comfortable SKIN: no acute rashes, no significant lesions EYES: conjunctiva are pink and non-injected, sclera anicteric OROPHARYNX: MMM, no exudates, no oropharyngeal erythema or ulceration NECK: supple, no JVD LYMPH:  no palpable lymphadenopathy in the cervical, axillary or inguinal regions LUNGS: clear to auscultation b/l with normal respiratory effort HEART: regular rate & rhythm ABDOMEN:  normoactive bowel sounds , non tender, not distended. No palpable hepatosplenomegaly.  Extremity: no pedal  edema PSYCH: alert & oriented x 3 with fluent speech NEURO: no focal motor/sensory deficits  LABORATORY DATA:   .[name] Kitchen CBC Latest Ref Rng & Units 04/27/2020 04/11/2020 04/10/2020  WBC 4.0 - 10.5 K/uL 65.3(HH) 28.8(H) 30.1(H)  Hemoglobin 12.0 - 15.0 g/dL 12.1 10.5(L) 10.0(L)  Hematocrit 36.0 - 46.0 % 36.2 33.0(L) 31.7(L)  Platelets 150 - 400 K/uL 106(L) 84(L) 69(L)   . CBC    Component Value Date/Time   WBC 65.3 (HH) 04/27/2020 1506   RBC 3.66 (L) 04/27/2020 1507   RBC 3.76 (L) 04/27/2020 1506   HGB 12.1 04/27/2020 1506   HGB 11.4 (L) 12/13/2018 1440   HGB 11.2 (L) 11/08/2017 1418   HCT 36.2 04/27/2020 1506   HCT 34.8 11/08/2017 1418   PLT 106 (L) 04/27/2020 1506   PLT 78 (L) 12/13/2018 1440   PLT 140 (L) 11/08/2017 1418   MCV 96.3 04/27/2020 1506   MCV 96.4 11/08/2017 1418   MCH 32.2 04/27/2020 1506   MCHC 33.4 04/27/2020 1506   RDW 12.8 04/27/2020 1506   RDW 14.8 (H) 11/08/2017 1418   LYMPHSABS 57.0 (H) 04/27/2020 1506   LYMPHSABS 87.0 (H) 11/08/2017 1418   MONOABS 2.7 (H) 04/27/2020 1506   MONOABS 1.5 (H) 11/08/2017 1418   EOSABS 0.4 04/27/2020 1506   EOSABS 0.6 (H) 11/08/2017 1418   BASOSABS 0.1 04/27/2020 1506   BASOSABS 0.2 (H) 11/08/2017 1418      CMP Latest Ref Rng & Units 04/27/2020 04/11/2020 04/10/2020  Glucose 70 - 99 mg/dL 116(H) 93 90  BUN 8 - 23 mg/dL 6(L) <5(L) <5(L)  Creatinine 0.44 - 1.00 mg/dL 0.82 0.77 0.69  Sodium 135 - 145 mmol/L 130(L) 137 138  Potassium 3.5 - 5.1 mmol/L 4.3 4.2 4.2  Chloride 98 - 111 mmol/L 93(L) 109 114(H)  CO2 22 - 32 mmol/L 26 20(L) 16(L)  Calcium 8.9 - 10.3 mg/dL 9.2 8.2(L) 7.1(L)  Total Protein 6.5 - 8.1 g/dL 6.7 - -  Total Bilirubin 0.3 - 1.2 mg/dL 0.7 - -  Alkaline Phos 38 - 126 U/L 69 - -  AST 15 - 41 U/L 28 - -  ALT 0 - 44 U/L 23 - -      RADIOGRAPHIC STUDIES: I have personally reviewed the radiological images as listed and agreed with the findings in the report.  CT ABDOMEN AND PELVIS WITHOUT CONTRAST    IMPRESSION: 1. No acute abnormalities within the abdomen or pelvis. 2. Mild splenomegaly and several prominent to mildly enlarged pelvic and inguinal lymph nodes. These findings are similar to the  prior CT. 3. Small nonobstructing stone in the lower pole the right kidney. No ureteral stones or obstructive uropathy.   Electronically Signed   By: Lajean Manes M.D.   On: 11/14/2016 16:39    ASSESSMENT & PLAN:   74 y.o.  caucasian female with   1.Rai Stage 2 previously - now Stage IV CLL with lymphocytosis, LNadenopathy and mild splenomegaly with anemia and thrombocytopenia. -patient has previously and continues to desire holding off CLL directed treatment as long as possible.   Lab Results  Component Value Date   LDH 182 04/27/2020   PLAN: -Discussed pt labwork today, 04/27/20; WBC are down slightly, Hgb is nml, PLT are slightly improved, blood chemistries are steady  -Discussed05/24/2021 Haptoglobin is in progress -Discussed05/24/2021 Immature PLT Fraction is WNL -Discussed05/24/2021 LDH is WNL -Discussed05/24/2021 Reticulocytes at [place] at 2.3, RBC at 3.66, Retic Ct Abs at 83.8, Immature Retic Fract at 21.3  -No lab or clinical evidence of significant CLL progression at this time -No indication to begin treatment at this time, but will continue to watch labs closely -Recommend OTC Probiotics and live-cultured yogurt for constipation -Will see back in 3 months with labs   2.  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 Has been taking good infection prevention precautions. CLL initially diagnosed 2009 with 13q deletion. Only intervention thus far has been intermittent IVIG, clinically very helpful with decrease in respiratory infections.  Last imaging was CT AP 02-2015. --showed no evidence of Splenomegaly. Negative Hep B serology 2015  No overt new constitutional symptoms except mild unchanged fatigue. Has had significant recurrent headaches with IVIG -  discontinued  3. Mild Anemia Hgb hemoglobin stable in the 10.5-11 range.  4. Moderate thrombocytopenia PLT count at [facility] and has recently been in the 80-90k range.  5. H/o  Recurrent cutaneous SCC s/p Mohs surgery for SCC of the nosehealed. No issues with infection-   her 13q deletion places her at risk for recurrent SCC. -continue close f/u with dermatologist for evaluation and management of non melanoma skin cancers that can be increased in patient with CLL with 13q deletion.   FOLLOW UP: RTC with Dr Irene Limbo with labs in 3 months   The total time spent in the appt was 20 minutes and more than 50% was on counseling and direct patient cares.  All of the patient's questions were answered with apparent satisfaction. The patient knows to call the clinic with any problems, questions or concerns.  Sullivan Lone MD [facility] AAHIVMS [facility] Hematology/Oncology Physician [facility]  (Office):       [phone] (Work cell):  [phone] (Fax):           [phone]   I, Yevette Edwards, am acting as a scribe for Dr. Sullivan Lone.   .I have reviewed the above documentation for accuracy and completeness, and I agree with the above. Brunetta Genera MD

## 2020-04-28 ENCOUNTER — Telehealth: Payer: Self-pay | Admitting: Hematology

## 2020-04-28 DIAGNOSIS — M961 Postlaminectomy syndrome, not elsewhere classified: Secondary | ICD-10-CM | POA: Diagnosis not present

## 2020-04-28 LAB — HAPTOGLOBIN: Haptoglobin: 97 mg/dL (ref 42–346)

## 2020-04-28 NOTE — Telephone Encounter (Signed)
Scheduled per 05/24 los, called patient and patient is notified.

## 2020-04-29 ENCOUNTER — Other Ambulatory Visit: Payer: Medicare Other

## 2020-04-29 ENCOUNTER — Ambulatory Visit: Payer: Medicare Other | Admitting: Hematology

## 2020-05-01 DIAGNOSIS — M5416 Radiculopathy, lumbar region: Secondary | ICD-10-CM | POA: Diagnosis not present

## 2020-05-01 DIAGNOSIS — M7061 Trochanteric bursitis, right hip: Secondary | ICD-10-CM | POA: Diagnosis not present

## 2020-05-01 DIAGNOSIS — M7062 Trochanteric bursitis, left hip: Secondary | ICD-10-CM | POA: Diagnosis not present

## 2020-05-07 DIAGNOSIS — M961 Postlaminectomy syndrome, not elsewhere classified: Secondary | ICD-10-CM | POA: Diagnosis not present

## 2020-05-13 ENCOUNTER — Other Ambulatory Visit: Payer: Self-pay

## 2020-05-13 ENCOUNTER — Encounter: Payer: Self-pay | Admitting: Physician Assistant

## 2020-05-13 ENCOUNTER — Ambulatory Visit: Payer: Medicare Other | Admitting: Physician Assistant

## 2020-05-13 VITALS — BP 136/78 | HR 60 | Temp 97.5°F | Wt 207.0 lb

## 2020-05-13 DIAGNOSIS — R350 Frequency of micturition: Secondary | ICD-10-CM

## 2020-05-13 MED ORDER — SULFAMETHOXAZOLE-TRIMETHOPRIM 800-160 MG PO TABS: 1.0000 | ORAL_TABLET | Freq: Two times a day (BID) | ORAL | 0 refills | Status: DC

## 2020-05-13 NOTE — Patient Instructions (Signed)

## 2020-05-13 NOTE — Progress Notes (Signed)
Assessment and Plan: Rebecca Bond was seen today for acute visit, urinary frequency and nocturnal enuresis.  Urinary frequency -     CBC with Differential/Platelet -     COMPLETE METABOLIC PANEL WITH GFR -     Urinalysis, Routine w reflex microscopic -     Urine Culture -     sulfamethoxazole-trimethoprim (BACTRIM DS) 800-160 MG tablet; Take 1 tablet by mouth 2 (two) times daily. -     CBC with Differential/Platelet -     MICROSCOPIC MESSAGE   The patient was advised to call immediately if she has any concerning symptoms in the interval. The patient voices understanding of current treatment options and is in agreement with the current care plan.The patient knows to call the clinic with any problems, questions or concerns or go to the ER if any further progression of symptoms.    Future Appointments  Date Time Provider [place]  06/16/2020  4:00 PM [name], MD [facility] None  07/06/2020  3:20 PM Pyrtle, Lajuan Lines, MD [facility]  08/03/2020  2:30 PM [facility] LAB 5 [facility] None  08/03/2020  3:00 PM Brunetta Genera, MD [facility] None  11/02/2020 11:00 AM [name], MD [facility] None     HPI 74 y.o.female with history of ofanemia, anxiety/depression, osteoarthritis, CLL, GERD, history of sinus headaches and recurrent sinus infections, history of urolithiasis, hypertension, OSA, history of pneumonia, prediabetes presents for possible UTI. She had recent CT AB without contrast that showed normal kidney and bladder.  She states she has had something like this before.  She has had a HA x 2 weeks. No changes in vision, no nausea, no neuro symptoms.  For 1 week she has been having frequency, the urge to urinate but unable to, urgency, new inconitence x 2, burning with urination. She has cut back on her fluids so lessen symptoms.   She has been having constipation, no nausea, no vomiting, no abnormal stool.   Lab Results  Component Value Date    CREATININE 0.82 04/27/2020   BUN 6 (L) 04/27/2020   NA 130 (L) 04/27/2020   K 4.3 04/27/2020   CL 93 (L) 04/27/2020   CO2 26 04/27/2020    BMI is Body mass index is 35.53 kg/m. Wt Readings from Last 3 Encounters:  05/13/20 207 lb (93.9 kg)  04/27/20 202 lb 11.2 oz (91.9 kg)  04/22/20 201 lb (91.2 kg)   Blood pressure 136/78, pulse 60, temperature (!) 97.5 F (36.4 C), weight 207 lb (93.9 kg), SpO2 95 %.   CT ABDOMEN PELVIS WO CONTRAST  Result Date: 04/08/2020 CLINICAL DATA:  Abdominal pain with diarrhea EXAM: CT ABDOMEN AND PELVIS WITHOUT CONTRAST TECHNIQUE: Multidetector CT imaging of the abdomen and pelvis was performed following the standard protocol without IV contrast. COMPARISON:  2017 FINDINGS: Lower chest: Mild bibasilar atelectasis. Hepatobiliary: Unchanged small cyst of the right hepatic lobe. Post cholecystectomy. No unexpected biliary dilatation. Pancreas: Unremarkable. Spleen: Unremarkable. Adrenals/Urinary Tract: Adrenals and kidneys are unremarkable. The bladder is minimally distended but unremarkable. Stomach/Bowel: Stomach is within normal limits. The bowel is normal in caliber. Appendix is not visualized and probably surgically absent. Vascular/Lymphatic: Minor aortic atherosclerosis. No enlarged lymph nodes identified. Reproductive: Status post hysterectomy. No adnexal masses. Other: No ascites. Musculoskeletal: Degenerative changes of the lumbar spine. IMPRESSION: No acute abnormality or findings to account for reported symptoms. Electronically Signed   By: Macy Mis M.D.   On: 04/08/2020 16:31     Current Outpatient Medications (Endocrine & Metabolic):  .  levothyroxine (SYNTHROID) 50 MCG tablet, Take 1 tablet (50 mcg total) by mouth daily.  Current Outpatient Medications (Cardiovascular):  .  bisoprolol-hydrochlorothiazide (ZIAC) 10-6.25 MG tablet, Take 1 tablet Daily for BP (Patient taking differently: Take 1 tablet by mouth daily. ) .  ezetimibe (ZETIA) 10 MG  tablet, Take 1 tablet Daily for Cholesterol (Patient taking differently: Take 10 mg by mouth at bedtime. Take 1 tablet Daily for Cholesterol)  Current Outpatient Medications (Respiratory):  .  cetirizine (ZYRTEC) 10 MG tablet, Take 10 mg by mouth at bedtime.    Current Outpatient Medications (Hematological):  [name] Kitchen  Cyanocobalamin (VITAMIN B12) 500 MCG TABS, Take 500 mcg by mouth in the morning.  Current Outpatient Medications (Other):  [name] Kitchen  ALPRAZolam (XANAX) 1 MG tablet, Take 1/2 - 1 tablet   2 - 3 x /day ONLY if needed for Anxiety Attack &  limit to 5 days /week to avoid Addiction & Dementia (Patient taking differently: Take 0.5-1 mg by mouth See admin instructions. Take 0.5-1 mg by mouth two to three times a day only as needed for anxiety attacks- limit to 5 days/week to avoid addiction and dementia) .  Ascorbic Acid (VITAMIN C) 1000 MG tablet, Take 1,500 mg by mouth daily.  .  Cholecalciferol (VITAMIN D3) 250 MCG (10000 UT) TABS, Take 10,000 Units by mouth in the morning. [name] Kitchen  FLUoxetine (PROZAC) 20 MG capsule, Take 1 capsule (20 mg total) by mouth daily. .  hyoscyamine (LEVSIN SL) 0.125 MG SL tablet, Place 0.125 mg under the tongue every 4 (four) hours as needed for cramping. .  loperamide (IMODIUM A-D) 2 MG tablet, Take 2 mg by mouth 4 (four) times daily as needed for diarrhea or loose stools. .  Olopatadine HCl 0.2 % SOLN, Place 1 drop into both eyes daily as needed (for seasonal allergies/itching).  [name] Kitchen  omeprazole (PRILOSEC) 40 MG capsule, Take 1 capsule Daily twice daily Prevent Heartburn & Indigestion (Patient taking differently: Take 40 mg by mouth daily before breakfast. ) .  Probiotic Product (PROBIOTIC PO), Take 1 capsule by mouth in the morning.  .  RESTASIS 0.05 % ophthalmic emulsion, Place 1 drop into both eyes 2 (two) times daily as needed (for seasonal allergies/irritation).   Past Medical History:  Diagnosis Date  . Anemia   . Anxiety   . Arthritis   . CLL (chronic lymphocytic  leukemia) ([place]) dx'd ~ 2015  . Depression   . GERD (gastroesophageal reflux disease)   . Headache    "sinus headaches"  . History of kidney stones   . Hypertension   . OSA (obstructive sleep apnea)   . Pneumonia   . Prediabetes   . Recurrent sinus infections 08/02/2012     Allergies  Allergen Reactions  . Hyzaar [Losartan Potassium-Hctz] Swelling and Other (See Comments)    "messed up my sodium counts"and swelled lips  . Sudafed [Pseudoephedrine Hcl] Palpitations  . Levaquin [Levofloxacin In D5w] Diarrhea and Nausea Only  . Acyclovir And Related Other (See Comments)    Reaction not recalled  . Biaxin [Clarithromycin]     GI Upset  . Cantaloupe Extract Allergy Skin Test Other (See Comments)    Nasal congestion, headaches, and sneezing  . Celexa [Citalopram] Other (See Comments)    Reaction not recalled  . Flexeril [Cyclobenzaprine]     "zombie-like" feeling  . Fosamax [Alendronate Sodium]     GI upset  . Iohexol Hives and Other (See Comments)    Patient stated she broke out in hive  20 yrs ago from IV contrast     . Losartan Swelling and Other (See Comments)    Angioedema  . Meloxicam     GI upset  . Norvasc [Amlodipine] Swelling and Other (See Comments)    Ankles swell  . Other Nausea Only and Other (See Comments)    Iceburg lettuce- "made me dreadfully nauseous" Raw mushrooms only-  "made me dreadfully nauseous" (cooked ones are tolerated)  . Pseudoephedrine     Palpitations  . Zoloft [Sertraline Hcl] Other (See Comments)    Has no emotions at all     ROS: all negative except above.   Physical Exam: Filed Weights   05/13/20 1610  Weight: 207 lb (93.9 kg)   BP 136/78   Pulse 60   Temp (!) 97.5 F (36.4 C)   Wt 207 lb (93.9 kg)   SpO2 95%   BMI 35.53 kg/m  General Appearance: Well nourished, in no apparent distress, no diaphroesis Eyes: PERRLA, EOMs, conjunctiva no swelling or erythema Sinuses:  Frontal/maxillary tenderness ENT/Mouth: Ext aud canals  clear, TMs without erythema, bulging. No erythema, swelling, or exudate on post pharynx.  Tonsils not swollen or erythematous. Hearing normal.  Neck: Supple, thyroid normal.  Respiratory: Respiratory effort normal, BS equal bilaterally without rales, rhonchi, wheezing or stridor.  Cardio: RRR with no MRGs. Brisk peripheral pulses with mild edema.  Abdomen: Soft, + BS, obese, diffusely overly tender to palpation,  without rebound, hernias, masses.No CVA tenderness. Lymphatics: Non tender without lymphadenopathy.  Musculoskeletal: Full ROM, 5/5 strength, Normal gait, Strength is normal and symmetric in arms. Skin:  Warm, dry without rashes, lesions, ecchymosis.  Neuro: Cranial nerves intact. Normal muscle tone, no cerebellar symptoms. Psych: Awake and oriented X 3, normal affect, appears anxious, Insight and Judgment appropriate.     Vicie Mutters, PA-C 4:20 PM [facility]

## 2020-05-14 ENCOUNTER — Other Ambulatory Visit: Payer: Self-pay | Admitting: Internal Medicine

## 2020-05-14 DIAGNOSIS — I10 Essential (primary) hypertension: Secondary | ICD-10-CM

## 2020-05-14 LAB — CBC WITH DIFFERENTIAL/PLATELET
Absolute Monocytes: 842 cells/uL (ref 200–950)
Basophils Absolute: 0 cells/uL (ref 0–200)
Basophils Relative: 0 %
Eosinophils Absolute: 210 cells/uL (ref 15–500)
Eosinophils Relative: 0.2 %
HCT: 33.5 % — ABNORMAL LOW (ref 35.0–45.0)
Hemoglobin: 10.5 g/dL — ABNORMAL LOW (ref 11.7–15.5)
Lymphs Abs: 95837 cells/uL — ABNORMAL HIGH (ref 850–3900)
MCH: 31.8 pg (ref 27.0–33.0)
MCHC: 31.3 g/dL — ABNORMAL LOW (ref 32.0–36.0)
MCV: 101.5 fL — ABNORMAL HIGH (ref 80.0–100.0)
MPV: 8.5 fL (ref 7.5–12.5)
Monocytes Relative: 0.8 %
Neutro Abs: 8311 cells/uL — ABNORMAL HIGH (ref 1500–7800)
Neutrophils Relative %: 7.9 %
Platelets: 62 10*3/uL — ABNORMAL LOW (ref 140–400)
RBC: 3.3 10*6/uL — ABNORMAL LOW (ref 3.80–5.10)
RDW: 13.7 % (ref 11.0–15.0)
Total Lymphocyte: 91.1 %
WBC: 105.2 10*3/uL — ABNORMAL HIGH (ref 3.8–10.8)

## 2020-05-14 LAB — COMPLETE METABOLIC PANEL WITH GFR
AG Ratio: 2.4 (calc) (ref 1.0–2.5)
ALT: 15 U/L (ref 6–29)
AST: 17 U/L (ref 10–35)
Albumin: 4.3 g/dL (ref 3.6–5.1)
Alkaline phosphatase (APISO): 67 U/L (ref 37–153)
BUN: 19 mg/dL (ref 7–25)
CO2: 22 mmol/L (ref 20–32)
Calcium: 9.6 mg/dL (ref 8.6–10.4)
Chloride: 94 mmol/L — ABNORMAL LOW (ref 98–110)
Creat: 0.73 mg/dL (ref 0.60–0.93)
GFR, Est African American: 95 mL/min/{1.73_m2} (ref >=60)
GFR, Est Non African American: 82 mL/min/{1.73_m2} (ref >=60)
Globulin: 1.8 g/dL (calc) — ABNORMAL LOW (ref 1.9–3.7)
Glucose, Bld: 107 mg/dL — ABNORMAL HIGH (ref 65–99)
Potassium: 4.1 mmol/L (ref 3.5–5.3)
Sodium: 129 mmol/L — ABNORMAL LOW (ref 135–146)
Total Bilirubin: 1.3 mg/dL — ABNORMAL HIGH (ref 0.2–1.2)
Total Protein: 6.1 g/dL (ref 6.1–8.1)

## 2020-05-14 LAB — URINE CULTURE
MICRO NUMBER:: 10571950
Result:: NO GROWTH
SPECIMEN QUALITY:: ADEQUATE

## 2020-05-14 LAB — URINALYSIS, ROUTINE W REFLEX MICROSCOPIC
Bacteria, UA: NONE SEEN /HPF
Bilirubin Urine: NEGATIVE
Glucose, UA: NEGATIVE
Hgb urine dipstick: NEGATIVE
Hyaline Cast: NONE SEEN /LPF
Ketones, ur: NEGATIVE
Leukocytes,Ua: NEGATIVE
Nitrite: NEGATIVE
RBC / HPF: NONE SEEN /HPF (ref 0–2)
Specific Gravity, Urine: 1.016 (ref 1.001–1.03)
Squamous Epithelial / HPF: NONE SEEN /HPF (ref <=5)
pH: 7 (ref 5.0–8.0)

## 2020-05-14 MED ORDER — BISOPROLOL-HYDROCHLOROTHIAZIDE 10-6.25 MG PO TABS: ORAL_TABLET | ORAL | 3 refills | Status: DC

## 2020-06-03 DIAGNOSIS — M961 Postlaminectomy syndrome, not elsewhere classified: Secondary | ICD-10-CM | POA: Diagnosis not present

## 2020-06-05 ENCOUNTER — Other Ambulatory Visit: Payer: Self-pay | Admitting: Physician Assistant

## 2020-06-05 DIAGNOSIS — F325 Major depressive disorder, single episode, in full remission: Secondary | ICD-10-CM

## 2020-06-12 DIAGNOSIS — M961 Postlaminectomy syndrome, not elsewhere classified: Secondary | ICD-10-CM | POA: Diagnosis not present

## 2020-06-16 ENCOUNTER — Ambulatory Visit: Payer: Medicare Other | Admitting: Internal Medicine

## 2020-06-20 ENCOUNTER — Other Ambulatory Visit: Payer: Self-pay | Admitting: Internal Medicine

## 2020-06-20 DIAGNOSIS — E782 Mixed hyperlipidemia: Secondary | ICD-10-CM

## 2020-06-22 ENCOUNTER — Encounter: Payer: Self-pay | Admitting: *Deleted

## 2020-06-24 DIAGNOSIS — M961 Postlaminectomy syndrome, not elsewhere classified: Secondary | ICD-10-CM | POA: Diagnosis not present

## 2020-07-02 DIAGNOSIS — M961 Postlaminectomy syndrome, not elsewhere classified: Secondary | ICD-10-CM | POA: Diagnosis not present

## 2020-07-03 DIAGNOSIS — Z1231 Encounter for screening mammogram for malignant neoplasm of breast: Secondary | ICD-10-CM | POA: Diagnosis not present

## 2020-07-03 LAB — HM MAMMOGRAPHY

## 2020-07-06 ENCOUNTER — Other Ambulatory Visit (INDEPENDENT_AMBULATORY_CARE_PROVIDER_SITE_OTHER): Payer: Medicare Other

## 2020-07-06 ENCOUNTER — Ambulatory Visit: Payer: Medicare Other | Admitting: Internal Medicine

## 2020-07-06 ENCOUNTER — Encounter: Payer: Self-pay | Admitting: Internal Medicine

## 2020-07-06 DIAGNOSIS — R198 Other specified symptoms and signs involving the digestive system and abdomen: Secondary | ICD-10-CM | POA: Diagnosis not present

## 2020-07-06 DIAGNOSIS — Z860100 Personal history of colon polyps, unspecified: Secondary | ICD-10-CM

## 2020-07-06 DIAGNOSIS — R1011 Right upper quadrant pain: Secondary | ICD-10-CM | POA: Diagnosis not present

## 2020-07-06 DIAGNOSIS — K219 Gastro-esophageal reflux disease without esophagitis: Secondary | ICD-10-CM

## 2020-07-06 LAB — CBC WITH DIFFERENTIAL/PLATELET
Basophils Absolute: 0.1 10*3/uL (ref 0.0–0.1)
Basophils Relative: 0.2 % (ref 0.0–3.0)
Eosinophils Absolute: 0.5 10*3/uL (ref 0.0–0.7)
Eosinophils Relative: 0.7 % (ref 0.0–5.0)
HCT: 37.7 % (ref 36.0–46.0)
Hemoglobin: 12.3 g/dL (ref 12.0–15.0)
Lymphocytes Relative: 89.6 % — ABNORMAL HIGH (ref 12.0–46.0)
Lymphs Abs: 65.5 10*3/uL — ABNORMAL HIGH (ref 0.7–4.0)
MCHC: 32.5 g/dL (ref 30.0–36.0)
MCV: 100.3 fl — ABNORMAL HIGH (ref 78.0–100.0)
Monocytes Absolute: 1.6 10*3/uL — ABNORMAL HIGH (ref 0.1–1.0)
Monocytes Relative: 2.2 % — ABNORMAL LOW (ref 3.0–12.0)
Neutro Abs: 5.4 10*3/uL (ref 1.4–7.7)
Neutrophils Relative %: 7.3 % — ABNORMAL LOW (ref 43.0–77.0)
Platelets: 90 10*3/uL — ABNORMAL LOW (ref 150.0–400.0)
RBC: 3.76 Mil/uL — ABNORMAL LOW (ref 3.87–5.11)
RDW: 17.7 % — ABNORMAL HIGH (ref 11.5–15.5)
WBC: 73.1 10*3/uL (ref 4.0–10.5)

## 2020-07-06 LAB — IGA: IgA: 21 mg/dL — ABNORMAL LOW (ref 68–378)

## 2020-07-06 LAB — COMPREHENSIVE METABOLIC PANEL
ALT: 14 U/L (ref 0–35)
AST: 22 U/L (ref 0–37)
Albumin: 4.6 g/dL (ref 3.5–5.2)
Alkaline Phosphatase: 60 U/L (ref 39–117)
BUN: 12 mg/dL (ref 6–23)
CO2: 26 mEq/L (ref 19–32)
Calcium: 9.3 mg/dL (ref 8.4–10.5)
Chloride: 98 mEq/L (ref 96–112)
Creatinine, Ser: 0.76 mg/dL (ref 0.40–1.20)
GFR: 74.45 mL/min (ref >60.00)
Glucose, Bld: 101 mg/dL — ABNORMAL HIGH (ref 70–99)
Potassium: 3.6 mEq/L (ref 3.5–5.1)
Sodium: 133 mEq/L — ABNORMAL LOW (ref 135–145)
Total Bilirubin: 0.9 mg/dL (ref 0.2–1.2)
Total Protein: 6.4 g/dL (ref 6.0–8.3)

## 2020-07-06 MED ORDER — PANTOPRAZOLE SODIUM 40 MG PO TBEC
DELAYED_RELEASE_TABLET | ORAL | 6 refills | Status: DC
Start: 2020-07-06 — End: 2021-02-10

## 2020-07-06 MED ORDER — SUTAB 1479-225-188 MG PO TABS: 24.0000 | ORAL_TABLET | ORAL | 0 refills | Status: DC

## 2020-07-06 NOTE — Patient Instructions (Addendum)
If you are age 74 or older, your body mass index should be between 23-30. Your Body mass index is 36.49 kg/m. If this is out of the aforementioned range listed, please consider follow up with your Primary Care Provider.  If you are age 45 or younger, your body mass index should be between 19-25. Your Body mass index is 36.49 kg/m. If this is out of the aformentioned range listed, please consider follow up with your Primary Care Provider.   You have been scheduled for an endoscopy and colonoscopy. Please follow the written instructions given to you at your visit today. Please pick up your prep supplies at the pharmacy within the next 1-3 days. If you use inhalers (even only as needed), please bring them with you on the day of your procedure.  You have been scheduled for an MRI at [facility] on 07/15/20. Your appointment time is 8:00am. Please arrive 30 minutes prior to your appointment time for registration purposes. Please make certain not to have anything to eat or drink 4 hours prior to your test. In addition, if you have any metal in your body, have a pacemaker or defibrillator, please be sure to let your ordering physician know. This test typically takes 45 minutes to 1 hour to complete. Should you need to reschedule, please call [phone] to do so.  We have sent the following medications to your pharmacy for you to pick up at your convenience:  START: pantoprazole 40mg  take one tablet 30 minutes prior to lunch each day.  Your provider has requested that you go to the basement level for lab work before leaving today. Press "B" on the elevator. The lab is located at the first door on the left as you exit the elevator.  Due to recent changes in healthcare laws, you may see the results of your imaging and laboratory studies on MyChart before your provider has had a chance to review them.  We understand that in some cases there may be results that are confusing or concerning to you. Not all  laboratory results come back in the same time frame and the provider may be waiting for multiple results in order to interpret others.  Please give [place] 48 hours in order for your provider to thoroughly review all the results before contacting the office for clarification of your results.   Thank you for entrusting me with your care and choosing [facility].  Dr Hilarie Fredrickson

## 2020-07-06 NOTE — Progress Notes (Signed)
Subjective:    Patient ID: Rebecca Bond, female    DOB: 1946-10-16, 74 y.o.   MRN: [id number]  HPI Rebecca Bond is a 74 year old female with a history of GERD, adenomatous colon polyps, CLL, OSA, hypertension, obesity, prior gallbladder disease with gallstones status post cholecystectomy in 1992, skin cancer who is seen in follow-up to evaluate upper and right sided abdominal pain, alternating diarrhea and constipation and heartburn symptoms.  She is here alone today.  She was last seen in the office on 01/08/2019 by Tye Savoy, NP.  She was hospitalized with severe diarrhea in May 2021.  GI pathogen panel at that time was positive for norovirus.  She reports that currently she is having different issues all with her GI system.  She reports that she is having alternating loose stools with constipation.  Stools at times can be urgent particularly after eating.  Stools are nonbloody and nonmelenic.  She is on a probiotic which she has been on for a long term.  She takes Imodium on occasion to help control her loose stools.  Separate from her bowel habits she experiences a tight belt-like pain across her upper abdomen bilaterally under her bra line.  This very often radiates to her right upper quadrant and around her right upper back and even into her right lower quadrant at times.  This is uncomfortable but not a sharp or stabbing pain.  It is associated at times with nausea.  She does report problems with acid reflux and pyrosis.  Her abdominal pain does not seem to relate to eating or bowel movements.  There are no definite triggers.  Eating at times makes the nausea better.  In the past she has tried [employer] which caused lower abdominal pain and significant bloating.  Of note she did have a CT scan of her abdomen pelvis without contrast on 04/08/2020 performed while she was hospitalized for norovirus.  This showed no acute abnormalities.  There was a small unchanged cyst in the right hepatic  lobe.  No unexpected biliary dilatation.  Pancreas was unremarkable.  Bowel is within normal limits.   Review of Systems As per HPI, otherwise negative  Current Medications, Allergies, Past Medical History, Past Surgical History, Family History and Social History were reviewed in [employer] record.     Objective:   Physical Exam BP 110/70   Pulse 77   Ht 5\' 3"  (1.6 m)   Wt 206 lb (93.4 kg)   BMI 36.49 kg/m  Gen: awake, alert, NAD HEENT: anicteric CV: RRR, no mrg Pulm: CTA b/l Abd: soft, mild tenderness in the epigastrium and right upper quadrant without rebound or guarding, nondistended, obese, +BS throughout Ext: no c/c, trace pretibial edema Neuro: nonfocal  CT ABDOMEN AND PELVIS WITHOUT CONTRAST   TECHNIQUE: Multidetector CT imaging of the abdomen and pelvis was performed following the standard protocol without IV contrast.   COMPARISON:  2017   FINDINGS: Lower chest: Mild bibasilar atelectasis.   Hepatobiliary: Unchanged small cyst of the right hepatic lobe. Post cholecystectomy. No unexpected biliary dilatation.   Pancreas: Unremarkable.   Spleen: Unremarkable.   Adrenals/Urinary Tract: Adrenals and kidneys are unremarkable. The bladder is minimally distended but unremarkable.   Stomach/Bowel: Stomach is within normal limits. The bowel is normal in caliber. Appendix is not visualized and probably surgically absent.   Vascular/Lymphatic: Minor aortic atherosclerosis. No enlarged lymph nodes identified.   Reproductive: Status post hysterectomy. No adnexal masses.   Other: No ascites.  Musculoskeletal: Degenerative changes of the lumbar spine.   IMPRESSION: No acute abnormality or findings to account for reported symptoms.     Electronically Signed   By: Macy Mis M.D.   On: 04/08/2020 16:31   CBC    Component Value Date/Time   WBC 105.2 (H) 05/13/2020 1634   RBC 3.30 (L) 05/13/2020 1634   HGB 10.5 (L) 05/13/2020  1634   HGB 11.4 (L) 12/13/2018 1440   HGB 11.2 (L) 11/08/2017 1418   HCT 33.5 (L) 05/13/2020 1634   HCT 34.8 11/08/2017 1418   PLT 62 (L) 05/13/2020 1634   PLT 78 (L) 12/13/2018 1440   PLT 140 (L) 11/08/2017 1418   MCV 101.5 (H) 05/13/2020 1634   MCV 96.4 11/08/2017 1418   MCH 31.8 05/13/2020 1634   MCHC 31.3 (L) 05/13/2020 1634   RDW 13.7 05/13/2020 1634   RDW 14.8 (H) 11/08/2017 1418   LYMPHSABS 95,837 (H) 05/13/2020 1634   LYMPHSABS 87.0 (H) 11/08/2017 1418   MONOABS 2.7 (H) 04/27/2020 1506   MONOABS 1.5 (H) 11/08/2017 1418   EOSABS 210 05/13/2020 1634   EOSABS 0.6 (H) 11/08/2017 1418   BASOSABS 0 05/13/2020 1634   BASOSABS 0.2 (H) 11/08/2017 1418   CMP     Component Value Date/Time   NA 129 (L) 05/13/2020 1634   NA 134 (L) 11/08/2017 1418   K 4.1 05/13/2020 1634   K 4.1 11/08/2017 1418   CL 94 (L) 05/13/2020 1634   CL 102 08/10/2012 1455   CO2 22 05/13/2020 1634   CO2 23 11/08/2017 1418   GLUCOSE 107 (H) 05/13/2020 1634   GLUCOSE 96 11/08/2017 1418   GLUCOSE 107 (H) 08/10/2012 1455   BUN 19 05/13/2020 1634   BUN 14.0 11/08/2017 1418   CREATININE 0.73 05/13/2020 1634   CREATININE 0.9 11/08/2017 1418   CALCIUM 9.6 05/13/2020 1634   CALCIUM 9.7 11/08/2017 1418   PROT 6.1 05/13/2020 1634   PROT 6.9 11/08/2017 1418   ALBUMIN 4.3 04/27/2020 1506   ALBUMIN 4.2 11/08/2017 1418   AST 17 05/13/2020 1634   AST 28 04/27/2020 1506   AST 24 11/08/2017 1418   ALT 15 05/13/2020 1634   ALT 23 04/27/2020 1506   ALT 23 11/08/2017 1418   ALKPHOS 69 04/27/2020 1506   ALKPHOS 62 11/08/2017 1418   BILITOT 1.3 (H) 05/13/2020 1634   BILITOT 0.7 04/27/2020 1506   BILITOT 1.05 11/08/2017 1418   GFRNONAA 82 05/13/2020 1634   GFRAA 95 05/13/2020 1634         Assessment & Plan:  74 year old female with a history of GERD, adenomatous colon polyps, CLL, OSA, hypertension, obesity, prior gallbladder disease with gallstones status post cholecystectomy in 1992, skin cancer who is  seen in follow-up to evaluate upper and right sided abdominal pain, alternating diarrhea and constipation and heartburn symptoms.  1.  Heartburn/GERD --symptoms consistent with gastroesophageal reflux disease.  Going to start her on PPI --Pantoprazole 40 mg daily, she will separate this from her levothyroxine and I advised she use this 30 minutes before lunch daily  2.  Upper abdominal pain/right upper quadrant pain/alternating bowel habits--upper endoscopy and colonoscopy were performed previously for similar symptoms.  History of gallbladder disease with gallstones but status post cholecystectomy almost 30 years ago. --CBC, CMP, celiac panel --MRI abdomen with MRCP --Tentative EGD to be performed at the same time as colonoscopy if MRI/MRCP unrevealing and pantoprazole does not improve pain --Consideration given for Benefiber but she reports that this  caused significant abdominal bloating and pain in the past at low-dose so I am avoiding fiber supplementation for now  3.  History of adenomatous colon polyps --surveillance colonoscopy recommended at this time for her history of adenomatous colon polyps.  We discussed the risk, benefits and alternatives and she is agreeable and wishes to proceed.  We will wait about 6 weeks as we begin the above medications and complete the above described work-up.  45 minutes total spent today including patient facing time, coordination of care, reviewing medical history/procedures/pertinent radiology studies, and documentation of the encounter.

## 2020-07-07 LAB — TISSUE TRANSGLUTAMINASE, IGA: (tTG) Ab, IgA: 1 U/mL

## 2020-07-08 ENCOUNTER — Encounter: Payer: Self-pay | Admitting: *Deleted

## 2020-07-09 ENCOUNTER — Other Ambulatory Visit: Payer: Self-pay | Admitting: Internal Medicine

## 2020-07-15 ENCOUNTER — Other Ambulatory Visit (HOSPITAL_COMMUNITY): Payer: Medicare Other

## 2020-07-16 ENCOUNTER — Ambulatory Visit (HOSPITAL_COMMUNITY): Payer: Medicare Other

## 2020-07-23 ENCOUNTER — Other Ambulatory Visit: Payer: Self-pay | Admitting: Internal Medicine

## 2020-07-23 ENCOUNTER — Ambulatory Visit (HOSPITAL_COMMUNITY)
Admission: RE | Admit: 2020-07-23 | Discharge: 2020-07-23 | Disposition: A | Discharge status: Home / Self Care | Payer: Medicare Other | Source: Ambulatory Visit | Attending: Internal Medicine | Admitting: Internal Medicine

## 2020-07-23 ENCOUNTER — Other Ambulatory Visit: Payer: Self-pay

## 2020-07-23 DIAGNOSIS — R1011 Right upper quadrant pain: Secondary | ICD-10-CM | POA: Diagnosis not present

## 2020-07-23 DIAGNOSIS — R935 Abnormal findings on diagnostic imaging of other abdominal regions, including retroperitoneum: Secondary | ICD-10-CM | POA: Diagnosis not present

## 2020-07-23 DIAGNOSIS — K760 Fatty (change of) liver, not elsewhere classified: Secondary | ICD-10-CM | POA: Diagnosis not present

## 2020-07-23 MED ORDER — GADOBUTROL 1 MMOL/ML IV SOLN
9.0000 mL | Freq: Once | INTRAVENOUS | Status: AC | PRN
  Administered 2020-07-23: 9 mL via INTRAVENOUS

## 2020-07-27 ENCOUNTER — Ambulatory Visit: Payer: Medicare Other | Admitting: Hematology

## 2020-07-27 ENCOUNTER — Other Ambulatory Visit: Payer: Medicare Other

## 2020-07-29 DIAGNOSIS — M25562 Pain in left knee: Secondary | ICD-10-CM | POA: Diagnosis not present

## 2020-07-29 DIAGNOSIS — M25561 Pain in right knee: Secondary | ICD-10-CM | POA: Diagnosis not present

## 2020-07-29 DIAGNOSIS — M1711 Unilateral primary osteoarthritis, right knee: Secondary | ICD-10-CM | POA: Diagnosis not present

## 2020-07-29 DIAGNOSIS — M79672 Pain in left foot: Secondary | ICD-10-CM | POA: Diagnosis not present

## 2020-08-03 ENCOUNTER — Inpatient Hospital Stay: Payer: Medicare Other

## 2020-08-03 ENCOUNTER — Inpatient Hospital Stay: Payer: Medicare Other | Admitting: Hematology

## 2020-08-13 ENCOUNTER — Inpatient Hospital Stay: Payer: Medicare Other | Admitting: Hematology

## 2020-08-13 ENCOUNTER — Other Ambulatory Visit: Payer: Self-pay

## 2020-08-13 ENCOUNTER — Inpatient Hospital Stay: Payer: Medicare Other | Attending: Hematology

## 2020-08-13 ENCOUNTER — Telehealth: Payer: Self-pay | Admitting: Hematology

## 2020-08-13 VITALS — BP 172/63 | HR 65 | Temp 98.5°F | Resp 18 | Ht 63.0 in | Wt 208.2 lb

## 2020-08-13 DIAGNOSIS — D649 Anemia, unspecified: Secondary | ICD-10-CM | POA: Diagnosis not present

## 2020-08-13 DIAGNOSIS — C9110 Chronic lymphocytic leukemia of B-cell type not having achieved remission: Secondary | ICD-10-CM | POA: Diagnosis not present

## 2020-08-13 DIAGNOSIS — D696 Thrombocytopenia, unspecified: Secondary | ICD-10-CM

## 2020-08-13 LAB — CMP (CANCER CENTER ONLY)
ALT: 23 U/L (ref 0–44)
AST: 27 U/L (ref 15–41)
Albumin: 3.9 g/dL (ref 3.5–5.0)
Alkaline Phosphatase: 65 U/L (ref 38–126)
Anion gap: 11 (ref 5–15)
BUN: 14 mg/dL (ref 8–23)
CO2: 23 mmol/L (ref 22–32)
Calcium: 9.1 mg/dL (ref 8.9–10.3)
Chloride: 103 mmol/L (ref 98–111)
Creatinine: 0.84 mg/dL (ref 0.44–1.00)
GFR, Est AFR Am: >60 mL/min (ref >60)
GFR, Estimated: >60 mL/min (ref >60)
Glucose, Bld: 111 mg/dL — ABNORMAL HIGH (ref 70–99)
Potassium: 3.9 mmol/L (ref 3.5–5.1)
Sodium: 137 mmol/L (ref 135–145)
Total Bilirubin: 0.9 mg/dL (ref 0.3–1.2)
Total Protein: 6.2 g/dL — ABNORMAL LOW (ref 6.5–8.1)

## 2020-08-13 LAB — CBC WITH DIFFERENTIAL/PLATELET
Abs Immature Granulocytes: 0.2 10*3/uL — ABNORMAL HIGH (ref 0.00–0.07)
Basophils Absolute: 0.1 10*3/uL (ref 0.0–0.1)
Basophils Relative: 0 %
Eosinophils Absolute: 0.2 10*3/uL (ref 0.0–0.5)
Eosinophils Relative: 0 %
HCT: 34.2 % — ABNORMAL LOW (ref 36.0–46.0)
Hemoglobin: 11.3 g/dL — ABNORMAL LOW (ref 12.0–15.0)
Immature Granulocytes: 0 %
Lymphocytes Relative: 90 %
Lymphs Abs: 57.1 10*3/uL — ABNORMAL HIGH (ref 0.7–4.0)
MCH: 33.5 pg (ref 26.0–34.0)
MCHC: 33 g/dL (ref 30.0–36.0)
MCV: 101.5 fL — ABNORMAL HIGH (ref 80.0–100.0)
Monocytes Absolute: 1.6 10*3/uL — ABNORMAL HIGH (ref 0.1–1.0)
Monocytes Relative: 2 %
Neutro Abs: 5.4 10*3/uL (ref 1.7–7.7)
Neutrophils Relative %: 8 %
Platelets: 64 10*3/uL — ABNORMAL LOW (ref 150–400)
RBC: 3.37 MIL/uL — ABNORMAL LOW (ref 3.87–5.11)
RDW: 13.7 % (ref 11.5–15.5)
WBC: 64.5 10*3/uL (ref 4.0–10.5)
nRBC: 0 % (ref 0.0–0.2)

## 2020-08-13 LAB — LACTATE DEHYDROGENASE: LDH: 181 U/L (ref 98–192)

## 2020-08-13 LAB — MAGNESIUM: Magnesium: 1.3 mg/dL — CL (ref 1.7–2.4)

## 2020-08-13 NOTE — Progress Notes (Signed)
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  INTERVAL HISTORY: Rebecca Bond returns today for management and evaluation of [name]. The patient's last visit with [place] was on 04/27/2020. The pt reports that she is doing well overall.  The pt reports that she had steroid injections in her knees in the interim. She is working with her PCP to find solutions for her arthritic pain. Her PCP does not think that she needs a knee replacement at this time.   Pt is scheduled to have an Endoscopy and Colonoscopy next Wednesday for evaluation of dysphagia, reflux, and abdominal symptoms and routine cancer screening.  Lab results today (08/13/20) of CBC w/diff and CMP is as follows: all values are WNL except for WBC at 64.5K, RBC at 3.37, Hgb at 11.3, HCT at 34.2, MCV at 101.5, PLT at 64K, Lymphs Abs at 57.1, Mono Abs at 1.6K, Abs Immature Granulocytes at 0.20K, WBC Morphology shows "Variant Lymphs", Smudge Cells are "Present", Glucose at 111, Total Protein at  6.2. 08/13/2020 LDH at 181 08/13/2020 Magnesium at 1.3  On review of systems, pt reports joint pain, dry skin and denies fevers, chills, night sweats, new lumps/bumps, new abdominal pain and any other symptoms.   REVIEW OF SYSTEMS:   A 10+ POINT REVIEW OF SYSTEMS WAS OBTAINED including neurology, dermatology, psychiatry, cardiac, respiratory, lymph, extremities, GI, GU, Musculoskeletal, constitutional, breasts, reproductive, HEENT.  All pertinent positives are noted in the HPI.  All others are negative.   Past Medical History:  Diagnosis Date  . Anemia   . Anxiety   . Arthritis   . CLL (chronic lymphocytic leukemia) ([place]) dx'd ~ 2015  . Depression   . GERD (gastroesophageal reflux disease)   . Headache    "sinus headaches"  . History of kidney stones   . Hypertension   . OSA (obstructive sleep apnea)   . Osteoarthritis   . Pneumonia   . Prediabetes   . Recurrent sinus infections 08/02/2012  . Tubular adenoma of colon     . Past Surgical History:  Procedure Laterality Date  . ABDOMINAL HYSTERECTOMY  1980's   "endometrosis"  . APPENDECTOMY    . BREAST SURGERY    . CATARACT EXTRACTION, BILATERAL    . CHOLECYSTECTOMY  1985  . FUNCTIONAL ENDOSCOPIC SINUS SURGERY  1990's   "cause I kept having sinus infections"  . immunoglobulin treatment  2017  . LAPAROSCOPIC APPENDECTOMY N/A 06/04/2014   Procedure: APPENDECTOMY LAPAROSCOPIC;  Surgeon: Zenovia Jarred, MD;  Location: [facility];  Service: General;  Laterality: N/A;  . LUMBAR LAMINECTOMY/DECOMPRESSION MICRODISCECTOMY Left 11/01/2017   Procedure: Left Lumbar Four-Five Extraforaminal Microdiscectomy;  Surgeon: Eustace Moore, MD;  Location: [facility];  Service: Neurosurgery;  Laterality: Left;  . LYMPH NODE BIOPSY     "determined I had CLL"  . SHOULDER ARTHROSCOPY Right   . TEMPOROMANDIBULAR JOINT ARTHROPLASTY  1980's  . TONSILLECTOMY AND ADENOIDECTOMY  1950's    . Social History   Tobacco Use  . Smoking status: Former Smoker     Packs/day: 0.75    Years: 4.00    Pack years: 3.00    Types: Cigarettes    Quit date: 12/05/1968    Years since quitting: 51.7  . Smokeless tobacco: Never Used  Vaping Use  . Vaping Use: Never used  Substance Use Topics  . Alcohol use: Yes    Alcohol/week: 7.0 standard drinks    Types: 7 Glasses of wine per week  . Drug use: No    ALLERGIES:  is allergic to hyzaar [losartan potassium-hctz], sudafed [pseudoephedrine hcl], levaquin [levofloxacin in d5w], acyclovir and related, biaxin [clarithromycin], cantaloupe extract allergy skin test, celexa [citalopram], flexeril [cyclobenzaprine], fosamax [alendronate sodium], iohexol, losartan, meloxicam, norvasc [amlodipine], other, pseudoephedrine, and zoloft [sertraline hcl].  MEDICATIONS:  Current Outpatient Medications  Medication Sig Dispense Refill  . ALPRAZolam (XANAX) 1 MG tablet Take 1/2 - 1 tablet 2 - 3 x /day ONLY if needed for Anxiety Attack &  limit to 5 days /week to avoid Addiction & Dementia 90 tablet 0  . Ascorbic Acid (VITAMIN C) 1000 MG tablet Take 1,500 mg by mouth daily.     . bisoprolol-hydrochlorothiazide (ZIAC) 10-6.25 MG tablet Take 1 tablet Daily for BP 90 tablet 3  . cetirizine (ZYRTEC) 10 MG tablet Take 10 mg by mouth at bedtime.     . Cholecalciferol (VITAMIN D3) 250 MCG (10000 UT) TABS Take 10,000 Units by mouth in the morning.    . Cyanocobalamin (VITAMIN B12) 500 MCG TABS Take 500 mcg by mouth in the morning.    . ezetimibe (ZETIA) 10 MG tablet Take 1 tablet Daily for Cholesterol 90 tablet 0  . FLUoxetine (PROZAC) 20 MG capsule TAKE 1 CAPSULE(20 MG) BY MOUTH DAILY 30 capsule 2  . hyoscyamine (LEVSIN SL) 0.125 MG SL tablet Place 0.125 mg under the tongue every 4 (four) hours as needed for cramping.    [name] Kitchen levothyroxine (SYNTHROID) 50 MCG tablet Take 1 tablet (50 mcg total) by mouth daily. 30 tablet 11  . loperamide (IMODIUM A-D) 2 MG tablet Take 2 mg by mouth 4 (four) times daily as needed for diarrhea or loose stools.     . Olopatadine HCl 0.2 % SOLN Place 1 drop into both eyes daily as needed (for seasonal allergies/itching).     . pantoprazole (PROTONIX) 40 MG tablet Take 1 tablet 30 minutes prior to lunch daily 30 tablet 6  . Probiotic Product (PROBIOTIC PO) Take 1 capsule by mouth in the morning.     . RESTASIS 0.05 % ophthalmic emulsion Place 1 drop into both eyes 2 (two) times daily as needed (for seasonal allergies/irritation).     . Sodium Sulfate-Mag Sulfate-KCl (SUTAB) [phone] MG TABS Take 24 tablets by mouth as directed. 24 tablet 0   No current facility-administered medications for this visit.    PHYSICAL EXAMINATION: ECOG PERFORMANCE STATUS: 1 - Symptomatic but completely ambulatory  Vitals:   08/13/20 1349  BP: (!) 172/63  Pulse: 65  Resp: 18  Temp: 98.5 F (36.9 C)  SpO2: 98%    Filed Weights   08/13/20 1349  Weight: 208 lb 3.2 oz (94.4 kg)   .  Body mass index is 36.88 kg/m.   GENERAL:alert, in no acute distress and comfortable SKIN: no acute rashes, no significant lesions EYES: conjunctiva are pink and non-injected, sclera anicteric OROPHARYNX: MMM, no exudates, no oropharyngeal erythema or ulceration NECK: supple, no JVD LYMPH:  no palpable lymphadenopathy in the cervical, axillary or inguinal regions LUNGS: clear to auscultation b/l with normal respiratory effort HEART: regular rate & rhythm ABDOMEN:  normoactive bowel sounds , non tender, not distended. No palpable hepatosplenomegaly.  Extremity: no pedal edema PSYCH: alert & oriented x 3 with fluent speech NEURO: no focal motor/sensory deficits  LABORATORY DATA:   .[name] Kitchen CBC Latest Ref Rng & Units 08/13/2020 07/06/2020 05/13/2020  WBC 4.0 - 10.5 K/uL 64.5(HH) 73.1 cH(HH) 105.2(H)  Hemoglobin 12.0 - 15.0 g/dL 11.3(L) 12.3 10.5(L)  Hematocrit 36 - 46 % 34.2(L) 37.7 33.5(L)  Platelets 150 - 400 K/uL 64(L) 90.0(L) 62(L)   . CBC    Component Value Date/Time   WBC 64.5 (HH) 08/13/2020 1327   RBC 3.37 (L) 08/13/2020  1327   HGB 11.3 (L) 08/13/2020 1327   HGB 11.4 (L) 12/13/2018 1440   HGB 11.2 (L) 11/08/2017 1418   HCT 34.2 (L) 08/13/2020 1327   HCT 34.8 11/08/2017 1418   PLT 64 (L) 08/13/2020 1327   PLT 78 (L) 12/13/2018 1440   PLT 140 (L) 11/08/2017 1418   MCV 101.5 (H) 08/13/2020 1327   MCV 96.4 11/08/2017 1418   MCH 33.5 08/13/2020 1327   MCHC 33.0 08/13/2020 1327   RDW 13.7 08/13/2020 1327   RDW 14.8 (H) 11/08/2017 1418   LYMPHSABS 57.1 (H) 08/13/2020 1327   LYMPHSABS 87.0 (H) 11/08/2017 1418   MONOABS 1.6 (H) 08/13/2020 1327   MONOABS 1.5 (H) 11/08/2017 1418   EOSABS 0.2 08/13/2020 1327   EOSABS 0.6 (H) 11/08/2017 1418   BASOSABS 0.1 08/13/2020 1327   BASOSABS 0.2 (H) 11/08/2017 1418      CMP Latest Ref Rng & Units 08/13/2020 07/06/2020 05/13/2020  Glucose 70 - 99 mg/dL 111(H) 101(H) 107(H)  BUN 8 - 23 mg/dL 14 12 19   Creatinine 0.44 - 1.00 mg/dL 0.84 0.76 0.73  Sodium 135 - 145 mmol/L 137 133(L) 129(L)  Potassium 3.5 - 5.1 mmol/L 3.9 3.6 4.1  Chloride 98 - 111 mmol/L 103 98 94(L)  CO2 22 - 32 mmol/L 23 26 22   Calcium 8.9 - 10.3 mg/dL 9.1 9.3 9.6  Total Protein 6.5 - 8.1 g/dL 6.2(L) 6.4 6.1  Total Bilirubin 0.3 - 1.2 mg/dL 0.9 0.9 1.3(H)  Alkaline Phos 38 - 126 U/L 65 60 -  AST 15 - 41 U/L 27 22 17   ALT 0 - 44 U/L 23 14 15       RADIOGRAPHIC STUDIES: I have personally reviewed the radiological images as listed and agreed with the findings in the report.  CT ABDOMEN AND PELVIS WITHOUT CONTRAST   IMPRESSION: 1. No acute abnormalities within the abdomen or pelvis. 2. Mild splenomegaly and several prominent to mildly enlarged pelvic and inguinal lymph nodes. These findings are similar to the prior CT. 3. Small nonobstructing stone in the lower pole the right kidney. No ureteral stones or obstructive uropathy.   Electronically Signed   By: Lajean Manes M.D.   On: 11/14/2016 16:39    ASSESSMENT & PLAN:   74 y.o.  caucasian female with   1.Rai Stage 2 previously -  now Stage IV CLL with lymphocytosis, LNadenopathy and mild splenomegaly with anemia and thrombocytopenia. -patient has previously and continues to desire  holding off CLL directed treatment as long as possible.   Lab Results  Component Value Date   LDH 181 08/13/2020   PLAN: -Discussed pt labwork today, 08/13/20; blood counts & chemistries are relatively stable, PLT down again, Magnesium is low, LDH is WNL -Advised pt that PLT are low, but are enough to undergo a Colonoscopy.  -Discussed CDC guidelines regarding the COVID19 booster. Pt will schedule with our clinic.  -Recommend pt receive annual flu vaccine when available. Wait two weeks between immunizations if possible.  -No lab or clinical evidence of significant CLL progression at this time. -patient has thrombocytopenia which would suggest indication to begin treatment but patient chooses to continue to hold off treatment at this time-will continue to watch labs closely. -Will see back in 3 months with labs  2.  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 Has been taking good infection prevention precautions. CLL initially diagnosed 2009 with 13q deletion. Only intervention thus far has been intermittent IVIG, clinically very helpful with decrease in respiratory infections.  Last imaging was CT AP 02-2015. --showed no evidence of Splenomegaly. Negative Hep B serology 2015  No overt new constitutional symptoms except mild unchanged fatigue. Has had significant recurrent headaches with IVIG - discontinued  3. Mild Anemia Hgb hemoglobin stable in the 10.5-11 range.  4. Moderate thrombocytopenia PLT count at [facility] and has recently been in the 80-90k range.  5. H/o  Recurrent cutaneous SCC s/p Mohs surgery for SCC of the nosehealed. No issues with infection-   her 13q deletion places her at risk for recurrent SCC. -continue close f/u with dermatologist for evaluation and management of non melanoma skin cancers that can be increased in patient with  CLL with 13q deletion.   FOLLOW UP: Patient okay to schedule covid booster vaccine RTC with Dr Irene Limbo in 3 months with labs   The total time spent in the appt was 20 minutes and more than 50% was on counseling and direct patient cares.  All of the patient's questions were answered with apparent satisfaction. The patient knows to call the clinic with any problems, questions or concerns.   Sullivan Lone MD [place] AAHIVMS [facility] Hematology/Oncology Physician [facility]  (Office):       [phone] (Work cell):  [phone] (Fax):           [phone]   I, Yevette Edwards, am acting as a scribe for Dr. Sullivan Lone.   .I have reviewed the above documentation for accuracy and completeness, and I agree with the above. Brunetta Genera MD

## 2020-08-13 NOTE — Telephone Encounter (Signed)
Scheduled per 09/09 los, patient received updated calender.

## 2020-08-19 ENCOUNTER — Other Ambulatory Visit: Payer: Self-pay

## 2020-08-19 ENCOUNTER — Ambulatory Visit (AMBULATORY_SURGERY_CENTER): Payer: Medicare Other | Admitting: Internal Medicine

## 2020-08-19 ENCOUNTER — Encounter: Payer: Self-pay | Admitting: Internal Medicine

## 2020-08-19 VITALS — BP 175/78 | HR 62 | Temp 96.9°F | Resp 10 | Ht 63.0 in | Wt 206.0 lb

## 2020-08-19 DIAGNOSIS — K3189 Other diseases of stomach and duodenum: Secondary | ICD-10-CM

## 2020-08-19 DIAGNOSIS — K319 Disease of stomach and duodenum, unspecified: Secondary | ICD-10-CM

## 2020-08-19 DIAGNOSIS — D122 Benign neoplasm of ascending colon: Secondary | ICD-10-CM | POA: Diagnosis not present

## 2020-08-19 DIAGNOSIS — K449 Diaphragmatic hernia without obstruction or gangrene: Secondary | ICD-10-CM | POA: Diagnosis not present

## 2020-08-19 DIAGNOSIS — Z86010 Personal history of colonic polyps: Secondary | ICD-10-CM

## 2020-08-19 DIAGNOSIS — K219 Gastro-esophageal reflux disease without esophagitis: Secondary | ICD-10-CM | POA: Diagnosis not present

## 2020-08-19 DIAGNOSIS — K225 Diverticulum of esophagus, acquired: Secondary | ICD-10-CM | POA: Diagnosis not present

## 2020-08-19 DIAGNOSIS — D123 Benign neoplasm of transverse colon: Secondary | ICD-10-CM | POA: Diagnosis not present

## 2020-08-19 DIAGNOSIS — R1010 Upper abdominal pain, unspecified: Secondary | ICD-10-CM

## 2020-08-19 DIAGNOSIS — R1011 Right upper quadrant pain: Secondary | ICD-10-CM

## 2020-08-19 MED ORDER — SODIUM CHLORIDE 0.9 % IV SOLN: 500.0000 mL | Freq: Once | INTRAVENOUS | Status: DC

## 2020-08-19 MED ORDER — DIPHENOXYLATE-ATROPINE 2.5-0.025 MG PO TABS: 1.0000 | ORAL_TABLET | Freq: Three times a day (TID) | ORAL | 0 refills | Status: DC

## 2020-08-19 NOTE — Patient Instructions (Signed)
Information on polyps, hiatal hernias and gastritis given to you today.  Await pathology results.  Resume previous diet and medications.  YOU HAD AN ENDOSCOPIC PROCEDURE TODAY AT [facility]:   Refer to the procedure report that was given to you for any specific questions about what was found during the examination.  If the procedure report does not answer your questions, please call your gastroenterologist to clarify.  If you requested that your care partner not be given the details of your procedure findings, then the procedure report has been included in a sealed envelope for you to review at your convenience later.  YOU SHOULD EXPECT: Some feelings of bloating in the abdomen. Passage of more gas than usual.  Walking can help get rid of the air that was put into your GI tract during the procedure and reduce the bloating. If you had a lower endoscopy (such as a colonoscopy or flexible sigmoidoscopy) you may notice spotting of blood in your stool or on the toilet paper. If you underwent a bowel prep for your procedure, you may not have a normal bowel movement for a few days.  Please Note:  You might notice some irritation and congestion in your nose or some drainage.  This is from the oxygen used during your procedure.  There is no need for concern and it should clear up in a day or so.  SYMPTOMS TO REPORT IMMEDIATELY:   Following lower endoscopy (colonoscopy or flexible sigmoidoscopy):  Excessive amounts of blood in the stool  Significant tenderness or worsening of abdominal pains  Swelling of the abdomen that is new, acute  Fever of 100F or higher   For urgent or emergent issues, a gastroenterologist can be reached at any hour by calling [phone]. Do not use MyChart messaging for urgent concerns.    DIET:  We do recommend a small meal at first, but then you may proceed to your regular diet.  Drink plenty of fluids but you should avoid alcoholic beverages for 24  hours.  ACTIVITY:  You should plan to take it easy for the rest of today and you should NOT DRIVE or use heavy machinery until tomorrow (because of the sedation medicines used during the test).    FOLLOW UP: Our staff will call the number listed on your records 48-72 hours following your procedure to check on you and address any questions or concerns that you may have regarding the information given to you following your procedure. If we do not reach you, we will leave a message.  We will attempt to reach you two times.  During this call, we will ask if you have developed any symptoms of COVID 19. If you develop any symptoms (ie: fever, flu-like symptoms, shortness of breath, cough etc.) before then, please call [phone].  If you test positive for Covid 19 in the 2 weeks post procedure, please call and report this information to [place].    If any biopsies were taken you will be contacted by phone or by letter within the next 1-3 weeks.  Please call us at [phone] if you have not heard about the biopsies in 3 weeks.    SIGNATURES/CONFIDENTIALITY: You and/or your care partner have signed paperwork which will be entered into your electronic medical record.  These signatures attest to the fact that that the information above on your After Visit Summary has been reviewed and is understood.  Full responsibility of the confidentiality of this discharge information lies with  you and/or your care-partner.

## 2020-08-19 NOTE — Op Note (Signed)
Rebecca Bond: Rebecca Bond Procedure Date: 08/19/2020 3:22 PM MRN: [id number] Endoscopist: Jerene Bears , MD Age: 74 Referring MD:  Date of Birth: 17-Apr-1946 Gender: Female Account #: [id number] Procedure:                Upper GI endoscopy Indications:              Abdominal pain in the right upper quadrant, Upper                            abdominal pain, Gastro-esophageal reflux disease Medicines:                Monitored Anesthesia Care Procedure:                Pre-Anesthesia Assessment:                           - Prior to the procedure, a History and Physical                            was performed, and patient medications and                            allergies were reviewed. The patient's tolerance of                            previous anesthesia was also reviewed. The risks                            and benefits of the procedure and the sedation                            options and risks were discussed with the patient.                            All questions were answered, and informed consent                            was obtained. Prior Anticoagulants: The patient has                            taken no previous anticoagulant or antiplatelet                            agents. ASA Grade Assessment: III - A patient with                            severe systemic disease. After reviewing the risks                            and benefits, the patient was deemed in                            satisfactory condition to undergo the procedure.  After obtaining informed consent, the endoscope was                            passed under direct vision. Throughout the                            procedure, the patient's blood pressure, pulse, and                            oxygen saturations were monitored continuously. The                            Endoscope was introduced through the mouth, and                             advanced to the second part of duodenum. The upper                            GI endoscopy was accomplished without difficulty.                            The patient tolerated the procedure well. Scope In: Scope Out: Findings:                 A Zenker's diverticulum with a large opening was                            found just above the upper esophageal sphincter.                           The examined esophagus was normal.                           A 3 cm hiatal hernia was present with a partial and                            non-obstructing ring.                           Mildly erythematous mucosa without bleeding was                            found in the gastric antrum. Biopsies were taken                            with a cold forceps for histology and Helicobacter                            pylori testing.                           The examined duodenum was normal. Complications:            No immediate complications. Estimated Blood Loss:     Estimated blood loss was minimal. Impression:               -  Zenker's diverticulum.                           - Normal esophageal mucosa.                           - 3 cm hiatal hernia.                           - Erythematous mucosa in the antrum. Biopsied.                           - Normal examined duodenum. Recommendation:           - Patient has a contact number available for                            emergencies. The signs and symptoms of potential                            delayed complications were discussed with the                            patient. Return to normal activities tomorrow.                            Written discharge instructions were provided to the                            patient.                           - Resume previous diet.                           - Continue present medications.                           - Await pathology results.                           - See the other procedure note for  documentation of                            additional recommendations. Jerene Bears, MD 08/19/2020 4:11:44 PM This report has been signed electronically.

## 2020-08-19 NOTE — Op Note (Signed)
[facility] Patient Name: Rebecca Bond Procedure Date: 08/19/2020 3:22 PM MRN: [id number] Endoscopist: Jerene Bears , MD Age: 74 Referring MD:  Date of Birth: July 16, 1946 Gender: Female Account #: [id number] Procedure:                Colonoscopy Indications:              High risk colon cancer surveillance: Personal                            history of multiple adenomas, Last colonoscopy:                            December 2017 Medicines:                Monitored Anesthesia Care Procedure:                Pre-Anesthesia Assessment:                           - Prior to the procedure, a History and Physical                            was performed, and patient medications and                            allergies were reviewed. The patient's tolerance of                            previous anesthesia was also reviewed. The risks                            and benefits of the procedure and the sedation                            options and risks were discussed with the patient.                            All questions were answered, and informed consent                            was obtained. Prior Anticoagulants: The patient has                            taken no previous anticoagulant or antiplatelet                            agents. ASA Grade Assessment: III - A patient with                            severe systemic disease. After reviewing the risks                            and benefits, the patient was deemed in  satisfactory condition to undergo the procedure.                           After obtaining informed consent, the colonoscope                            was passed under direct vision. Throughout the                            procedure, the patient's blood pressure, pulse, and                            oxygen saturations were monitored continuously. The                            Colonoscope was introduced through the anus and                             advanced to the cecum, identified by appendiceal                            orifice and ileocecal valve. The colonoscopy was                            performed without difficulty. The patient tolerated                            the procedure well. The quality of the bowel                            preparation was good. The ileocecal valve,                            appendiceal orifice, and rectum were photographed. Scope In: 3:50:09 PM Scope Out: 4:05:00 PM Scope Withdrawal Time: 0 hours 12 minutes 39 seconds  Total Procedure Duration: 0 hours 14 minutes 51 seconds  Findings:                 The digital rectal exam was normal.                           A 6 mm polyp was found in the ascending colon. The                            polyp was sessile. The polyp was removed with a                            cold snare. Resection and retrieval were complete.                           A 8 mm polyp was found in the distal transverse                            colon. The polyp  was sessile. The polyp was removed                            with a cold snare. Resection and retrieval were                            complete.                           The exam was otherwise without abnormality on                            direct and retroflexion views. Complications:            No immediate complications. Estimated Blood Loss:     Estimated blood loss was minimal. Impression:               - One 6 mm polyp in the ascending colon, removed                            with a cold snare. Resected and retrieved.                           - One 8 mm polyp in the distal transverse colon,                            removed with a cold snare. Resected and retrieved.                           - The examination was otherwise normal on direct                            and retroflexion views. Recommendation:           - Patient has a contact number available for                             emergencies. The signs and symptoms of potential                            delayed complications were discussed with the                            patient. Return to normal activities tomorrow.                            Written discharge instructions were provided to the                            patient.                           - Resume previous diet.                           - Continue present medications.                           -  Await pathology results.                           - Repeat colonoscopy may be recommended for                            surveillance. The colonoscopy date will be                            determined after pathology results from today's                            exam become available for review. Jerene Bears, MD 08/19/2020 4:14:33 PM This report has been signed electronically.

## 2020-08-19 NOTE — Progress Notes (Signed)
Called to room to assist during endoscopic procedure.  Patient ID and intended procedure confirmed with present staff. Received instructions for my participation in the procedure from the performing physician.  

## 2020-08-19 NOTE — Progress Notes (Signed)
pt tolerated well. VSS. awake and to recovery. Report given to RN. Oral bite blocked placed and removed without truma.

## 2020-08-20 ENCOUNTER — Telehealth: Payer: Self-pay | Admitting: Internal Medicine

## 2020-08-20 NOTE — Telephone Encounter (Signed)
Lomotil script called in for pt.

## 2020-08-21 ENCOUNTER — Telehealth: Payer: Self-pay | Admitting: *Deleted

## 2020-08-21 NOTE — Telephone Encounter (Signed)
  Follow up Call-  Call back number 08/19/2020  Post procedure Call Back phone  # [phone]  Permission to leave phone message Yes  Some recent data might be hidden     Patient questions:  Do you have a fever, pain , or abdominal swelling? No. Pain Score  0 *  Have you tolerated food without any problems? Yes.    Have you been able to return to your normal activities? Yes.    Do you have any questions about your discharge instructions: Diet   No. Medications  No. Follow up visit  No.  Do you have questions or concerns about your Care? Yes.    Actions: * If pain score is 4 or above: No action needed, pain <4.  1. Have you developed a fever since your procedure? no  2.   Have you had an respiratory symptoms (SOB or cough) since your procedure? no  3.   Have you tested positive for COVID 19 since your procedure no  4.   Have you had any family members/close contacts diagnosed with the COVID 19 since your procedure? no   If yes to any of these questions please route to Joylene John, RN and Joella Prince, RN

## 2020-08-24 ENCOUNTER — Encounter: Payer: Self-pay | Admitting: Internal Medicine

## 2020-08-25 ENCOUNTER — Telehealth: Payer: Self-pay | Admitting: Hematology

## 2020-08-25 NOTE — Telephone Encounter (Signed)
Called pt per 9/21 sch msg - no answer. Left message for patient to call back

## 2020-08-26 ENCOUNTER — Inpatient Hospital Stay: Payer: Medicare Other

## 2020-08-31 ENCOUNTER — Ambulatory Visit: Payer: Medicare Other | Admitting: Internal Medicine

## 2020-09-03 ENCOUNTER — Other Ambulatory Visit: Payer: Self-pay | Admitting: Physician Assistant

## 2020-09-03 DIAGNOSIS — F325 Major depressive disorder, single episode, in full remission: Secondary | ICD-10-CM

## 2020-09-24 DIAGNOSIS — H40013 Open angle with borderline findings, low risk, bilateral: Secondary | ICD-10-CM | POA: Diagnosis not present

## 2020-09-24 DIAGNOSIS — H0101B Ulcerative blepharitis left eye, upper and lower eyelids: Secondary | ICD-10-CM | POA: Diagnosis not present

## 2020-09-24 DIAGNOSIS — H1013 Acute atopic conjunctivitis, bilateral: Secondary | ICD-10-CM | POA: Diagnosis not present

## 2020-09-24 DIAGNOSIS — H0101A Ulcerative blepharitis right eye, upper and lower eyelids: Secondary | ICD-10-CM | POA: Diagnosis not present

## 2020-10-17 ENCOUNTER — Other Ambulatory Visit: Payer: Self-pay | Admitting: Internal Medicine

## 2020-10-17 DIAGNOSIS — E782 Mixed hyperlipidemia: Secondary | ICD-10-CM

## 2020-11-02 ENCOUNTER — Encounter: Payer: 59 | Admitting: Internal Medicine

## 2020-11-03 DIAGNOSIS — H1013 Acute atopic conjunctivitis, bilateral: Secondary | ICD-10-CM | POA: Diagnosis not present

## 2020-11-03 DIAGNOSIS — H16223 Keratoconjunctivitis sicca, not specified as Sjogren's, bilateral: Secondary | ICD-10-CM | POA: Diagnosis not present

## 2020-11-03 DIAGNOSIS — H0101A Ulcerative blepharitis right eye, upper and lower eyelids: Secondary | ICD-10-CM | POA: Diagnosis not present

## 2020-11-03 DIAGNOSIS — H0101B Ulcerative blepharitis left eye, upper and lower eyelids: Secondary | ICD-10-CM | POA: Diagnosis not present

## 2020-11-04 ENCOUNTER — Other Ambulatory Visit: Payer: Self-pay | Admitting: Adult Health Nurse Practitioner

## 2020-11-04 DIAGNOSIS — F325 Major depressive disorder, single episode, in full remission: Secondary | ICD-10-CM

## 2020-11-11 ENCOUNTER — Inpatient Hospital Stay: Payer: Medicare Other | Admitting: Hematology

## 2020-11-11 ENCOUNTER — Inpatient Hospital Stay: Payer: Medicare Other

## 2020-11-11 NOTE — Progress Notes (Incomplete)
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  INTERVAL HISTORY: YOALI CONRY returns today for management and evaluation of [name]. The patient's last visit with [place] was on 08/13/2020. The pt reports that she is doing well overall.  The pt reports ***  Of note since the patient's last visit, pt has had *** completed on *** with results revealing ***.  Lab results today (11/11/20) of CBC w/diff and CMP is as follows: all values are WNL except for ***. 11/11/2020 LDH at ***  On review of systems, pt reports *** and denies ***and any other symptoms.   A&P: -Discussed pt labwork today, 11/11/20; *** -***  REVIEW OF SYSTEMS:   A 10+ POINT REVIEW OF SYSTEMS WAS OBTAINED including neurology, dermatology, psychiatry, cardiac, respiratory, lymph, extremities, GI, GU, Musculoskeletal, constitutional, breasts, reproductive, HEENT.  All pertinent positives are noted in the HPI.  All others are negative.   Past Medical History:  Diagnosis Date  .  Anemia   . Anxiety   . Arthritis   . CLL (chronic lymphocytic leukemia) ([place]) dx'd ~ 2015  . Depression   . GERD (gastroesophageal reflux disease)   . Headache    "sinus headaches"  . History of kidney stones   . Hypertension   . OSA (obstructive sleep apnea)   . Osteoarthritis   . Pneumonia   . Prediabetes   . Recurrent sinus infections 08/02/2012  . Tubular adenoma of colon     . Past Surgical History:  Procedure Laterality Date  . ABDOMINAL HYSTERECTOMY  1980's   "endometrosis"  . APPENDECTOMY    . BREAST SURGERY    . CATARACT EXTRACTION, BILATERAL    . CHOLECYSTECTOMY  1985  . FUNCTIONAL ENDOSCOPIC SINUS SURGERY  1990's   "cause I kept having sinus infections"  . immunoglobulin treatment  2017  . LAPAROSCOPIC APPENDECTOMY N/A 06/04/2014   Procedure: APPENDECTOMY LAPAROSCOPIC;  Surgeon: Zenovia Jarred, MD;  Location: [facility];  Service: General;  Laterality: N/A;  . LUMBAR LAMINECTOMY/DECOMPRESSION MICRODISCECTOMY Left 11/01/2017   Procedure: Left Lumbar Four-Five Extraforaminal Microdiscectomy;  Surgeon: Eustace Moore, MD;  Location: [facility];  Service: Neurosurgery;  Laterality: Left;  . LYMPH NODE BIOPSY     "determined I had CLL"  . SHOULDER ARTHROSCOPY Right   . TEMPOROMANDIBULAR JOINT ARTHROPLASTY  1980's  . TONSILLECTOMY AND ADENOIDECTOMY  1950's    . Social History   Tobacco Use  . Smoking status: Former Smoker    Packs/day: 0.75    Years: 4.00    Pack years: 3.00    Types: Cigarettes    Quit date: 12/05/1968    Years  since quitting: 51.9  . Smokeless tobacco: Never Used  Vaping Use  . Vaping Use: Never used  Substance Use Topics  . Alcohol use: Yes    Alcohol/week: 7.0 standard drinks    Types: 7 Glasses of wine per week  . Drug use: No    ALLERGIES:  is allergic to hyzaar [losartan potassium-hctz], sudafed [pseudoephedrine hcl], levaquin [levofloxacin in d5w], acyclovir and related, biaxin [clarithromycin], cantaloupe extract allergy skin test,  celexa [citalopram], flexeril [cyclobenzaprine], fosamax [alendronate sodium], iohexol, losartan, meloxicam, norvasc [amlodipine], other, pseudoephedrine, and zoloft [sertraline hcl].  MEDICATIONS:  Current Outpatient Medications  Medication Sig Dispense Refill  . ALPRAZolam (XANAX) 1 MG tablet Take 1/2 - 1 tablet 2 - 3 x /day ONLY if needed for Anxiety Attack &  limit to 5 days /week to avoid Addiction & Dementia 90 tablet 0  . Ascorbic Acid (VITAMIN C) 1000 MG tablet Take 1,500 mg by mouth daily.     . bisoprolol-hydrochlorothiazide (ZIAC) 10-6.25 MG tablet Take 1 tablet Daily for BP 90 tablet 3  . cetirizine (ZYRTEC) 10 MG tablet Take 10 mg by mouth at bedtime.  (Patient not taking: Reported on 08/19/2020)    . Cholecalciferol (VITAMIN D3) 250 MCG (10000 UT) TABS Take 10,000 Units by mouth in the morning.    . Cyanocobalamin (VITAMIN B12) 500 MCG TABS Take 500 mcg by mouth in the morning.    . diphenoxylate-atropine (LOMOTIL) 2.5-0.025 MG tablet Take 1 tablet by mouth 3 (three) times daily. 30 tablet 0  . ezetimibe (ZETIA) 10 MG tablet Take      1 tablet     Daily       for Cholesterol 90 tablet 0  . FLUoxetine (PROZAC) 20 MG capsule Take     1 capsule     Daily       for Mood 90 capsule 0  . hyoscyamine (LEVSIN SL) 0.125 MG SL tablet Place 0.125 mg under the tongue every 4 (four) hours as needed for cramping. (Patient not taking: Reported on 08/19/2020)    . levothyroxine (SYNTHROID) 50 MCG tablet Take 1 tablet (50 mcg total) by mouth daily. 30 tablet 11  . loperamide (IMODIUM A-D) 2 MG tablet Take 2 mg by mouth 4 (four) times daily as needed for diarrhea or loose stools.    . Olopatadine HCl 0.2 % SOLN Place 1 drop into both eyes daily as needed (for seasonal allergies/itching).     . pantoprazole (PROTONIX) 40 MG tablet Take 1 tablet 30 minutes prior to lunch daily (Patient not taking: Reported on 08/19/2020) 30 tablet 6  . Probiotic Product (PROBIOTIC PO) Take 1 capsule by mouth in the  morning.     . RESTASIS 0.05 % ophthalmic emulsion Place 1 drop into both eyes 2 (two) times daily as needed (for seasonal allergies/irritation).      No current facility-administered medications for this visit.    PHYSICAL EXAMINATION: ECOG PERFORMANCE STATUS: 1 - Symptomatic but completely ambulatory  There were no vitals filed for this visit.  There were no vitals filed for this visit. .There is no height or weight on file to calculate BMI.   *** GENERAL:alert, in no acute distress and comfortable SKIN: no acute rashes, no significant lesions EYES: conjunctiva are pink and non-injected, sclera anicteric OROPHARYNX: MMM, no exudates, no oropharyngeal erythema or ulceration NECK: supple, no JVD LYMPH:  no palpable lymphadenopathy in the cervical, axillary or inguinal regions LUNGS: clear to auscultation b/l with normal respiratory effort HEART: regular rate &  rhythm ABDOMEN:  normoactive bowel sounds , non tender, not distended. No palpable hepatosplenomegaly.  Extremity: no pedal edema PSYCH: alert & oriented x 3 with fluent speech NEURO: no focal motor/sensory deficits  LABORATORY DATA:   .[name] Kitchen CBC Latest Ref Rng & Units 08/13/2020 07/06/2020 05/13/2020  WBC 4.0 - 10.5 K/uL 64.5(HH) 73.1 cH(HH) 105.2(H)  Hemoglobin 12.0 - 15.0 g/dL 11.3(L) 12.3 10.5(L)  Hematocrit 36 - 46 % 34.2(L) 37.7 33.5(L)  Platelets 150 - 400 K/uL 64(L) 90.0(L) 62(L)   . CBC    Component Value Date/Time   WBC 64.5 (HH) 08/13/2020 1327   RBC 3.37 (L) 08/13/2020 1327   HGB 11.3 (L) 08/13/2020 1327   HGB 11.4 (L) 12/13/2018 1440   HGB 11.2 (L) 11/08/2017 1418   HCT 34.2 (L) 08/13/2020 1327   HCT 34.8 11/08/2017 1418   PLT 64 (L) 08/13/2020 1327   PLT 78 (L) 12/13/2018 1440   PLT 140 (L) 11/08/2017 1418   MCV 101.5 (H) 08/13/2020 1327   MCV 96.4 11/08/2017 1418   MCH 33.5 08/13/2020 1327   MCHC 33.0 08/13/2020 1327   RDW 13.7 08/13/2020 1327   RDW 14.8 (H) 11/08/2017 1418   LYMPHSABS 57.1 (H)  08/13/2020 1327   LYMPHSABS 87.0 (H) 11/08/2017 1418   MONOABS 1.6 (H) 08/13/2020 1327   MONOABS 1.5 (H) 11/08/2017 1418   EOSABS 0.2 08/13/2020 1327   EOSABS 0.6 (H) 11/08/2017 1418   BASOSABS 0.1 08/13/2020 1327   BASOSABS 0.2 (H) 11/08/2017 1418      CMP Latest Ref Rng & Units 08/13/2020 07/06/2020 05/13/2020  Glucose 70 - 99 mg/dL 111(H) 101(H) 107(H)  BUN 8 - 23 mg/dL 14 12 19   Creatinine 0.44 - 1.00 mg/dL 0.84 0.76 0.73  Sodium 135 - 145 mmol/L 137 133(L) 129(L)  Potassium 3.5 - 5.1 mmol/L 3.9 3.6 4.1  Chloride 98 - 111 mmol/L 103 98 94(L)  CO2 22 - 32 mmol/L 23 26 22   Calcium 8.9 - 10.3 mg/dL 9.1 9.3 9.6  Total Protein 6.5 - 8.1 g/dL 6.2(L) 6.4 6.1  Total Bilirubin 0.3 - 1.2 mg/dL 0.9 0.9 1.3(H)  Alkaline Phos 38 - 126 U/L 65 60 -  AST 15 - 41 U/L 27 22 17   ALT 0 - 44 U/L 23 14 15       RADIOGRAPHIC STUDIES: I have personally reviewed the radiological images as listed and agreed with the findings in the report.  CT ABDOMEN AND PELVIS WITHOUT CONTRAST   IMPRESSION: 1. No acute abnormalities within the abdomen or pelvis. 2. Mild splenomegaly and several prominent to mildly enlarged pelvic and inguinal lymph nodes. These findings are similar to the prior CT. 3. Small nonobstructing stone in the lower pole the right kidney. No ureteral stones or obstructive uropathy.   Electronically Signed   By: Lajean Manes M.D.   On: 11/14/2016 16:39    ASSESSMENT & PLAN:   74 y.o.  caucasian female with   1.Rai Stage 2 previously - now Stage IV CLL with lymphocytosis, LNadenopathy and mild splenomegaly with anemia and thrombocytopenia. -patient has previously and continues to desire holding off CLL directed treatment as long as possible.   Lab Results  Component Value Date   LDH 181 08/13/2020   PLAN: *** -No lab or clinical evidence of significant CLL progression at this time.*** -patient has thrombocytopenia which would suggest indication to begin treatment but  patient chooses to continue to hold off treatment at this time-will continue to watch labs closely.   2.  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 Has been taking good infection prevention precautions. CLL initially diagnosed 2009 with 13q deletion. Only intervention thus far has been intermittent IVIG, clinically very helpful with decrease in respiratory infections.  Last imaging was CT AP 02-2015. --showed no evidence of Splenomegaly. Negative Hep B serology 2015  No overt new constitutional symptoms except mild unchanged fatigue. Has had significant recurrent headaches with IVIG - discontinued  3. Mild Anemia Hgb hemoglobin stable in the 10.5-11 range.  4. Moderate thrombocytopenia PLT count at [facility] and has recently been in the 80-90k range.  5. H/o  Recurrent cutaneous SCC s/p Mohs surgery for SCC of the nosehealed. No issues with infection-   her 13q deletion places her at risk for recurrent SCC. -continue close f/u with dermatologist for evaluation and management of non melanoma skin cancers that can be increased in patient with CLL with 13q deletion.   FOLLOW UP: ***   The total time spent in the appt was *** minutes and more than 50% was on counseling and direct patient cares.  All of the patient's questions were answered with apparent satisfaction. The patient knows to call the clinic with any problems, questions or concerns.   Sullivan Lone MD [place] AAHIVMS [facility] Hematology/Oncology Physician [facility]  (Office):       [phone] (Work cell):  [phone] (Fax):           [phone]   I, Yevette Edwards, am acting as a scribe for Dr. Sullivan Lone.   {Add Barista Statement}

## 2020-12-08 ENCOUNTER — Other Ambulatory Visit: Payer: Self-pay | Admitting: Internal Medicine

## 2020-12-14 ENCOUNTER — Encounter: Payer: Medicare Other | Admitting: Internal Medicine

## 2020-12-30 ENCOUNTER — Telehealth: Payer: Self-pay | Admitting: *Deleted

## 2020-12-30 NOTE — Telephone Encounter (Signed)
Patient called - asked to cancel her appts for lab/Dr. Irene Limbo on 1/27. She has been exposed to Covid (6 family members are positive) and she wants to wait to come in to [place] to make sure she does not have it. Dr. Irene Limbo informed. Dr. Irene Limbo in agreement and states to move patient appts 21 days out. Patient informed and verbalized understanding. Requested patient notify office if she tests positive for Covid in the next 21 days. She states she will. Appts for 1/27 cancelled and schedule message sent.

## 2020-12-31 ENCOUNTER — Inpatient Hospital Stay: Payer: Medicare Other | Admitting: Hematology

## 2020-12-31 ENCOUNTER — Inpatient Hospital Stay: Payer: Medicare Other

## 2021-01-12 ENCOUNTER — Encounter: Payer: Medicare Other | Admitting: Internal Medicine

## 2021-01-14 ENCOUNTER — Other Ambulatory Visit: Payer: Self-pay | Admitting: Internal Medicine

## 2021-01-14 DIAGNOSIS — E782 Mixed hyperlipidemia: Secondary | ICD-10-CM

## 2021-01-22 ENCOUNTER — Other Ambulatory Visit: Payer: Self-pay | Admitting: Internal Medicine

## 2021-01-25 ENCOUNTER — Inpatient Hospital Stay: Payer: Medicare Other | Admitting: Hematology

## 2021-01-25 ENCOUNTER — Inpatient Hospital Stay: Payer: Medicare Other

## 2021-01-26 NOTE — Progress Notes (Signed)
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  INTERVAL HISTORY: Rebecca Bond returns today for management and evaluation of [name]. The patient's last visit with [place] was on 08/13/2020. The pt reports that she is doing well overall.  The pt reports that she recently had an ear infection last month and she had an infection in her throat. The pt denies getting tested for COVID and has received all her vaccines and booster. The pt denies needing any antibiotics for the throat issues. The pt notes that she is very isolated and this is affecting her current state of living as she is unable to go out at all due to the pandemic, even church. She feels the isolation she notes. The pt particularly misses going to church and desires to go to [place] for relaxation purposes. The pt notes much worry regarding [place] and the traffic due to all the people being on ships with the current state of the pandemic.   Lab results today 01/27/2021 of CBC  w/diff and CMP is as follows: all values are WNL except for WBC of 69.5K, RBC of 3.46, Hgb of 11.5, HCT of 34.1, Plt of 81K, Lymphs Abs of 62.0K, Monocytes Absolute of 2.3K, Abs Immature Granulocytes of 0.23K, Sodium of 134, Glucose of 107. 01/27/2021 LDH of 185.  On review of systems, pt reports recent infections, feeling of isolation and denies acute back pain, acute skin rashes, new lumps/bumps, abdominal pain, leg swelling and any other symptoms.  REVIEW OF SYSTEMS:   10 Point review of Systems was done is negative except as noted above.   Past Medical History:  Diagnosis Date  . Anemia   . Anxiety   . Arthritis   . CLL (chronic lymphocytic leukemia) ([place]) dx'd ~ 2015  . Depression   . GERD (gastroesophageal reflux disease)   . Headache    "sinus headaches"  . History of kidney stones   . Hypertension   . OSA (obstructive sleep apnea)   . Osteoarthritis   . Pneumonia   . Prediabetes   . Recurrent sinus infections 08/02/2012  . Tubular adenoma of colon     . Past Surgical History:  Procedure Laterality Date  . ABDOMINAL HYSTERECTOMY  1980's   "endometrosis"  . APPENDECTOMY    . BREAST SURGERY    . CATARACT EXTRACTION, BILATERAL    . CHOLECYSTECTOMY  1985  . FUNCTIONAL ENDOSCOPIC SINUS SURGERY  1990's   "cause I kept having sinus infections"  . immunoglobulin treatment  2017  . LAPAROSCOPIC APPENDECTOMY N/A 06/04/2014   Procedure: APPENDECTOMY LAPAROSCOPIC;  Surgeon: Zenovia Jarred, MD;  Location: [facility];  Service: General;  Laterality: N/A;  . LUMBAR LAMINECTOMY/DECOMPRESSION MICRODISCECTOMY Left 11/01/2017   Procedure: Left Lumbar Four-Five Extraforaminal Microdiscectomy;  Surgeon: Eustace Moore, MD;  Location: [facility];  Service: Neurosurgery;  Laterality: Left;  . LYMPH NODE BIOPSY     "determined I had CLL"  . SHOULDER ARTHROSCOPY Right   . TEMPOROMANDIBULAR JOINT ARTHROPLASTY  1980's  . TONSILLECTOMY AND ADENOIDECTOMY  1950's    . Social History   Tobacco Use   . Smoking status: Former Smoker    Packs/day: 0.75    Years: 4.00    Pack years: 3.00    Types: Cigarettes    Quit date: 12/05/1968    Years since quitting: 52.1  . Smokeless tobacco: Never Used  Vaping Use  . Vaping Use: Never used  Substance Use Topics  . Alcohol use: Yes    Alcohol/week: 7.0 standard drinks    Types: 7 Glasses of wine per week  . Drug use: No    ALLERGIES:  is allergic to hyzaar [losartan potassium-hctz], sudafed [pseudoephedrine hcl], levaquin [levofloxacin in d5w], acyclovir and related, biaxin [clarithromycin], cantaloupe extract allergy skin test, celexa [citalopram], flexeril [cyclobenzaprine], fosamax [alendronate sodium], iohexol, losartan, meloxicam, norvasc [amlodipine], other, pseudoephedrine, and zoloft [sertraline hcl].  MEDICATIONS:  Current Outpatient Medications  Medication Sig Dispense Refill  . ALPRAZolam (XANAX) 1 MG tablet Take      1/2 - 1 tablet      2 - 3 x /day       ONLY      if needed for Panic Attack or  Anxiety Attack &  limit to 5 days /week to avoid Addiction & Dementia 90 tablet 0  . Ascorbic Acid (VITAMIN C) 1000 MG tablet Take 1,500 mg by mouth daily.     . bisoprolol-hydrochlorothiazide (ZIAC) 10-6.25 MG tablet Take 1 tablet Daily for BP 90 tablet 3  . cetirizine (ZYRTEC) 10 MG tablet Take 10 mg by mouth at bedtime.  (Patient not taking: Reported on 08/19/2020)    . Cholecalciferol (VITAMIN D3) 250 MCG (10000 UT) TABS Take 10,000 Units by mouth in the morning.    . Cyanocobalamin (VITAMIN B12) 500 MCG TABS Take 500 mcg by mouth in the morning.    . diphenoxylate-atropine (LOMOTIL) 2.5-0.025 MG tablet Take 1 tablet by mouth 3 (three) times daily. 30 tablet 0  . ezetimibe (ZETIA) 10 MG tablet Take  1 tablet  Daily  for Cholesterol 30 tablet 0  . FLUoxetine (PROZAC) 20 MG capsule Take     1 capsule     Daily       for Mood 90 capsule 0  . hyoscyamine (LEVSIN SL) 0.125 MG SL tablet Place 0.125 mg under the tongue every 4 (four) hours as  needed for cramping. (Patient not taking: Reported on 08/19/2020)    . levothyroxine (SYNTHROID) 50 MCG tablet Take 1 tablet (50 mcg total) by mouth daily. 30 tablet 11  . loperamide (IMODIUM A-D) 2 MG tablet Take 2 mg by mouth 4 (four) times daily as needed for diarrhea or loose stools.    . Olopatadine HCl 0.2 % SOLN Place 1 drop into both eyes daily as needed (for seasonal allergies/itching).     . pantoprazole (PROTONIX) 40 MG tablet Take 1 tablet 30 minutes prior to lunch daily (Patient not taking: Reported on 08/19/2020) 30 tablet 6  . Probiotic Product (PROBIOTIC PO) Take 1 capsule by mouth in the morning.     . RESTASIS 0.05 % ophthalmic  emulsion Place 1 drop into both eyes 2 (two) times daily as needed (for seasonal allergies/irritation).      No current facility-administered medications for this visit.    PHYSICAL EXAMINATION: ECOG PERFORMANCE STATUS: 1 - Symptomatic but completely ambulatory  Vitals:   01/27/21 1455  BP: (!) 162/92  Pulse: 69  Resp: 18  Temp: (!) 97 F (36.1 C)  SpO2: 97%    Filed Weights   01/27/21 1455  Weight: 208 lb 8 oz (94.6 kg)   .Body mass index is 36.93 kg/m.   Exam was given in a chair.   GENERAL:alert, in no acute distress and comfortable SKIN: no acute rashes, no significant lesions EYES: conjunctiva are pink and non-injected, sclera anicteric OROPHARYNX: MMM, no exudates, no oropharyngeal erythema or ulceration NECK: supple, no JVD LYMPH:  no palpable lymphadenopathy in the cervical, axillary or inguinal regions LUNGS: clear to auscultation b/l with normal respiratory effort HEART: regular rate & rhythm ABDOMEN:  normoactive bowel sounds , non tender, not distended. Extremity: no pedal edema PSYCH: alert & oriented x 3 with fluent speech NEURO: no focal motor/sensory deficits  LABORATORY DATA:   CBC Latest Ref Rng & Units 01/27/2021 08/13/2020 07/06/2020  WBC 4.0 - 10.5 K/uL 69.5(HH) 64.5(HH) 73.1 cH(HH)  Hemoglobin 12.0 - 15.0 g/dL  11.5(L) 11.3(L) 12.3  Hematocrit 36.0 - 46.0 % 34.1(L) 34.2(L) 37.7  Platelets 150 - 400 K/uL 81(L) 64(L) 90.0(L)   CBC    Component Value Date/Time   WBC 69.5 (HH) 01/27/2021 1419   RBC 3.46 (L) 01/27/2021 1419   HGB 11.5 (L) 01/27/2021 1419   HGB 11.4 (L) 12/13/2018 1440   HGB 11.2 (L) 11/08/2017 1418   HCT 34.1 (L) 01/27/2021 1419   HCT 34.8 11/08/2017 1418   PLT 81 (L) 01/27/2021 1419   PLT 78 (L) 12/13/2018 1440   PLT 140 (L) 11/08/2017 1418   MCV 98.6 01/27/2021 1419   MCV 96.4 11/08/2017 1418   MCH 33.2 01/27/2021 1419   MCHC 33.7 01/27/2021 1419   RDW 13.1 01/27/2021 1419   RDW 14.8 (H) 11/08/2017 1418   LYMPHSABS 62.0 (H) 01/27/2021 1419   LYMPHSABS 87.0 (H) 11/08/2017 1418   MONOABS 2.3 (H) 01/27/2021 1419   MONOABS 1.5 (H) 11/08/2017 1418   EOSABS 0.2 01/27/2021 1419   EOSABS 0.6 (H) 11/08/2017 1418   BASOSABS 0.1 01/27/2021 1419   BASOSABS 0.2 (H) 11/08/2017 1418    CMP Latest Ref Rng & Units 01/27/2021 08/13/2020 07/06/2020  Glucose 70 - 99 mg/dL 107(H) 111(H) 101(H)  BUN 8 - 23 mg/dL 16 14 12   Creatinine 0.44 - 1.00 mg/dL 0.87 0.84 0.76  Sodium 135 - 145 mmol/L 134(L) 137 133(L)  Potassium 3.5 - 5.1 mmol/L 4.2 3.9 3.6  Chloride 98 - 111 mmol/L 101 103 98  CO2 22 - 32 mmol/L 23 23 26   Calcium 8.9 - 10.3 mg/dL 9.1 9.1 9.3  Total Protein 6.5 - 8.1 g/dL 6.6 6.2(L) 6.4  Total Bilirubin 0.3 - 1.2 mg/dL 0.7 0.9 0.9  Alkaline Phos 38 - 126 U/L 70 65 60  AST 15 - 41 U/L 31 27 22   ALT 0 - 44 U/L 24 23 14       RADIOGRAPHIC STUDIES: I have personally reviewed the radiological images as listed and agreed with the findings in the report.  CT ABDOMEN AND PELVIS WITHOUT CONTRAST   IMPRESSION: 1. No acute abnormalities within the abdomen or pelvis. 2. Mild splenomegaly and several prominent to mildly enlarged pelvic and inguinal  lymph nodes. These findings are similar to the prior CT. 3. Small nonobstructing stone in the lower pole the right kidney. No ureteral  stones or obstructive uropathy.   Electronically Signed   By: Lajean Manes M.D.   On: 11/14/2016 16:39    ASSESSMENT & PLAN:   75 y.o.  caucasian female with   1. Rai Stage 2 previously - now Stage IV CLL with lymphocytosis, LNadenopathy and mild splenomegaly with anemia and thrombocytopenia. -patient has previously and continues to desire holding off CLL directed treatment as long as possible.   Lab Results  Component Value Date   LDH 185 01/27/2021   PLAN: -Discussed pt labwork today, 01/27/2021; WBC stable. Plt improved. Other counts and chemistries stable. LDH normal. Mild anemia improved since last visit. Liver functions normal with no concerns. -Discussed [place] and pt's eligibility. Advised pt she is not in high risk category for this but can give more information. -Discussed current CDC guidelines regarding COVID vaccine and booster shots. The pt notes skepticism with COVID vaccines, booster, and governmental response. -Discussed risk of hospitalizations between unvaccinated and vaccinated persons in regard to the Coronavirus. -Recommended pt increase physical activity and safe physical interactions to try to get a returned sense of normalcy and combat elements of isolation. Suggested going to places at the least busy times and practice precautions when outside and in more social settings. -No lab or clinical evidence of significant CLL progression at this time. -Will see back in 4 months with labs.    2.  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 Has been taking good infection prevention precautions. CLL initially diagnosed 2009 with 13q deletion. Only intervention thus far has been intermittent IVIG, clinically very helpful with decrease in respiratory infections.  Last imaging was CT AP 02-2015. --showed no evidence of Splenomegaly. Negative Hep B serology 2015  No overt new constitutional symptoms except mild unchanged fatigue. Has had significant recurrent headaches  with IVIG - discontinued  3. Mild Anemia Hgb hemoglobin stable in the 10.5-11 range.  4. Moderate thrombocytopenia PLT count at [facility] and has recently been in the 80-90k range.  5. H/o  Recurrent cutaneous SCC s/p Mohs surgery for SCC of the nosehealed. No issues with infection-   her 13q deletion places her at risk for recurrent SCC. -continue close f/u with dermatologist for evaluation and management of non melanoma skin cancers that can be increased in patient with CLL with 13q deletion.   FOLLOW UP: RTC with Dr Irene Limbo with labs in 4 months    The total time spent in the appointment was 20 minutes and more than 50% was on counseling and direct patient cares.   All of the patient's questions were answered with apparent satisfaction. The patient knows to call the clinic with any problems, questions or concerns.   Sullivan Lone MD [facility] Hematology/Oncology Physician [facility]  (Office):       [phone] (Work cell):  [phone] (Fax):           [phone]   I, Reinaldo Raddle, am acting as scribe for Dr. Sullivan Lone, MD.     .I have reviewed the above documentation for accuracy and completeness, and I agree with the above. Brunetta Genera MD

## 2021-01-27 ENCOUNTER — Encounter: Payer: Self-pay | Admitting: *Deleted

## 2021-01-27 ENCOUNTER — Inpatient Hospital Stay: Payer: Medicare Other | Admitting: Hematology

## 2021-01-27 ENCOUNTER — Other Ambulatory Visit: Payer: Self-pay

## 2021-01-27 ENCOUNTER — Inpatient Hospital Stay: Payer: Medicare Other | Attending: Hematology

## 2021-01-27 VITALS — BP 162/92 | HR 69 | Temp 97.0°F | Resp 18 | Ht 63.0 in | Wt 208.5 lb

## 2021-01-27 DIAGNOSIS — D696 Thrombocytopenia, unspecified: Secondary | ICD-10-CM | POA: Diagnosis not present

## 2021-01-27 DIAGNOSIS — Z87891 Personal history of nicotine dependence: Secondary | ICD-10-CM | POA: Diagnosis not present

## 2021-01-27 DIAGNOSIS — Z90710 Acquired absence of both cervix and uterus: Secondary | ICD-10-CM | POA: Insufficient documentation

## 2021-01-27 DIAGNOSIS — C9110 Chronic lymphocytic leukemia of B-cell type not having achieved remission: Secondary | ICD-10-CM

## 2021-01-27 DIAGNOSIS — Z79899 Other long term (current) drug therapy: Secondary | ICD-10-CM | POA: Diagnosis not present

## 2021-01-27 DIAGNOSIS — D649 Anemia, unspecified: Secondary | ICD-10-CM

## 2021-01-27 DIAGNOSIS — D801 Nonfamilial hypogammaglobulinemia: Secondary | ICD-10-CM | POA: Insufficient documentation

## 2021-01-27 DIAGNOSIS — I10 Essential (primary) hypertension: Secondary | ICD-10-CM | POA: Diagnosis not present

## 2021-01-27 LAB — CMP (CANCER CENTER ONLY)
ALT: 24 U/L (ref 0–44)
AST: 31 U/L (ref 15–41)
Albumin: 4.3 g/dL (ref 3.5–5.0)
Alkaline Phosphatase: 70 U/L (ref 38–126)
Anion gap: 10 (ref 5–15)
BUN: 16 mg/dL (ref 8–23)
CO2: 23 mmol/L (ref 22–32)
Calcium: 9.1 mg/dL (ref 8.9–10.3)
Chloride: 101 mmol/L (ref 98–111)
Creatinine: 0.87 mg/dL (ref 0.44–1.00)
GFR, Estimated: >60 mL/min (ref >60)
Glucose, Bld: 107 mg/dL — ABNORMAL HIGH (ref 70–99)
Potassium: 4.2 mmol/L (ref 3.5–5.1)
Sodium: 134 mmol/L — ABNORMAL LOW (ref 135–145)
Total Bilirubin: 0.7 mg/dL (ref 0.3–1.2)
Total Protein: 6.6 g/dL (ref 6.5–8.1)

## 2021-01-27 LAB — CBC WITH DIFFERENTIAL/PLATELET
Abs Immature Granulocytes: 0.23 10*3/uL — ABNORMAL HIGH (ref 0.00–0.07)
Basophils Absolute: 0.1 10*3/uL (ref 0.0–0.1)
Basophils Relative: 0 %
Eosinophils Absolute: 0.2 10*3/uL (ref 0.0–0.5)
Eosinophils Relative: 0 %
HCT: 34.1 % — ABNORMAL LOW (ref 36.0–46.0)
Hemoglobin: 11.5 g/dL — ABNORMAL LOW (ref 12.0–15.0)
Immature Granulocytes: 0 %
Lymphocytes Relative: 90 %
Lymphs Abs: 62 10*3/uL — ABNORMAL HIGH (ref 0.7–4.0)
MCH: 33.2 pg (ref 26.0–34.0)
MCHC: 33.7 g/dL (ref 30.0–36.0)
MCV: 98.6 fL (ref 80.0–100.0)
Monocytes Absolute: 2.3 10*3/uL — ABNORMAL HIGH (ref 0.1–1.0)
Monocytes Relative: 3 %
Neutro Abs: 4.7 10*3/uL (ref 1.7–7.7)
Neutrophils Relative %: 7 %
Platelets: 81 10*3/uL — ABNORMAL LOW (ref 150–400)
RBC: 3.46 MIL/uL — ABNORMAL LOW (ref 3.87–5.11)
RDW: 13.1 % (ref 11.5–15.5)
WBC: 69.5 10*3/uL (ref 4.0–10.5)
nRBC: 0 % (ref 0.0–0.2)

## 2021-01-27 LAB — LACTATE DEHYDROGENASE: LDH: 185 U/L (ref 98–192)

## 2021-01-27 NOTE — Progress Notes (Signed)
CRITICAL VALUE STICKER  CRITICAL VALUE: WBC 69.5   RECEIVER (on-site recipient of call): Reco Shonk,RN  DATE & TIME NOTIFIED: 01/27/21 @ 1437  MESSENGER (representative from lab): Ulice Dash  MD NOTIFIED: Dr. Irene Limbo  TIME OF NOTIFICATION:1445  RESPONSE: Seeing patient in office now

## 2021-01-28 ENCOUNTER — Other Ambulatory Visit: Payer: Self-pay | Admitting: Internal Medicine

## 2021-01-28 DIAGNOSIS — E782 Mixed hyperlipidemia: Secondary | ICD-10-CM

## 2021-02-10 ENCOUNTER — Other Ambulatory Visit: Payer: Self-pay

## 2021-02-10 ENCOUNTER — Encounter: Payer: Self-pay | Admitting: Internal Medicine

## 2021-02-10 ENCOUNTER — Ambulatory Visit (INDEPENDENT_AMBULATORY_CARE_PROVIDER_SITE_OTHER): Payer: Medicare Other | Admitting: Internal Medicine

## 2021-02-10 VITALS — BP 124/88 | HR 72 | Temp 97.5°F | Resp 16 | Ht 62.0 in | Wt 205.8 lb

## 2021-02-10 DIAGNOSIS — R7309 Other abnormal glucose: Secondary | ICD-10-CM | POA: Diagnosis not present

## 2021-02-10 DIAGNOSIS — F5105 Insomnia due to other mental disorder: Secondary | ICD-10-CM

## 2021-02-10 DIAGNOSIS — F331 Major depressive disorder, recurrent, moderate: Secondary | ICD-10-CM

## 2021-02-10 DIAGNOSIS — Z6837 Body mass index (BMI) 37.0-37.9, adult: Secondary | ICD-10-CM

## 2021-02-10 DIAGNOSIS — I10 Essential (primary) hypertension: Secondary | ICD-10-CM

## 2021-02-10 DIAGNOSIS — E66812 Obesity, class 2: Secondary | ICD-10-CM

## 2021-02-10 DIAGNOSIS — G720 Drug-induced myopathy: Secondary | ICD-10-CM

## 2021-02-10 DIAGNOSIS — E559 Vitamin D deficiency, unspecified: Secondary | ICD-10-CM | POA: Diagnosis not present

## 2021-02-10 DIAGNOSIS — E6609 Other obesity due to excess calories: Secondary | ICD-10-CM | POA: Diagnosis not present

## 2021-02-10 DIAGNOSIS — T466X5A Adverse effect of antihyperlipidemic and antiarteriosclerotic drugs, initial encounter: Secondary | ICD-10-CM

## 2021-02-10 DIAGNOSIS — Z79899 Other long term (current) drug therapy: Secondary | ICD-10-CM

## 2021-02-10 DIAGNOSIS — E782 Mixed hyperlipidemia: Secondary | ICD-10-CM

## 2021-02-10 DIAGNOSIS — C9110 Chronic lymphocytic leukemia of B-cell type not having achieved remission: Secondary | ICD-10-CM

## 2021-02-10 DIAGNOSIS — F418 Other specified anxiety disorders: Secondary | ICD-10-CM

## 2021-02-10 DIAGNOSIS — F419 Anxiety disorder, unspecified: Secondary | ICD-10-CM

## 2021-02-10 MED ORDER — BUPROPION HCL ER (XL) 150 MG PO TB24: ORAL_TABLET | ORAL | 1 refills | Status: DC

## 2021-02-10 MED ORDER — EZETIMIBE 10 MG PO TABS: ORAL_TABLET | ORAL | 1 refills | Status: DC

## 2021-02-10 MED ORDER — TRAZODONE HCL 150 MG PO TABS: ORAL_TABLET | ORAL | 0 refills | Status: DC

## 2021-02-10 NOTE — Patient Instructions (Signed)

## 2021-02-10 NOTE — Progress Notes (Signed)
History of Present Illness:       This very nice 75 y.o. MWF presents for 6 month follow up with HTN, HLD, Pre-Diabetes, CLL  and Vitamin D Deficiency. Patient seems somewhat anxious & depressed today.        Patient is followed by Dr Irene Limbo for indolent CLL initially dx'd ~ 2015.           Patient is treated for HTN (1988) & BP has been controlled at home. Today's BP is at goal -124/88. Patient has had no complaints of any cardiac type chest pain, palpitations, dyspnea / orthopnea / PND, dizziness, claudication, or dependent edema.       Hyperlipidemia is controlled with diet & Ezetimibe.  ( Patient is Statin Intolerant ).  Patient denies myalgias or other med SE's. Last Lipids were near goal:  Lab Results  Component Value Date   CHOL 179 03/11/2020   HDL 52 03/11/2020   LDLCALC 103 (H) 03/11/2020   TRIG 142 03/11/2020   CHOLHDL 3.4 03/11/2020    Also, the patient has Morbid Obesity (BMI 39+)  & consequent PreDiabetes (A1c 6.0% /2012).  She denies symptoms of reactive hypoglycemia, diabetic polys, paresthesias or visual blurring.  Last A1c was normal & at goal:  Lab Results  Component Value Date   HGBA1C 5.1 03/11/2020           Further, the patient also has history of Vitamin D Deficiency ("30" /2008)and supplements vitamin D without any suspected side-effects. Last vitamin D was at goal:  Lab Results  Component Value Date   VD25OH 66 03/11/2020    Current Outpatient Medications on File Prior to Visit  Medication Sig  . ALPRAZolam  1 MG tablet Take  1/2 - 1 tablet   2 - 3 x /day ONLY if needed   . VITAMIN C 1000 MG tablet Take 1,500 mg  daily.   . bisoprolol-hctz  10-6.25 MG  Take 1 tablet Daily   . VITAMIN D  10,000 u Take 10,000 Units  in the morning.  [name] Kitchen VITAMIN B12     500 mcg  Take in the morning.  [name] Kitchen LOMOTIL Take 1 tablet 3  times daily.  [name] Kitchen ezetimibe  10 MG tablet Take  1 tablet  Daily   . FLUoxetine 20 MG  Take 1 capsule Daily   . hyoscyamine SL 0.125 MG  SL ta Place under tongue every 4 hours as needed - cramping.  [name] Kitchen levothyroxine 50 MCG tablet Take 1 tablet  daily.  . Probiotic  Take 1 capsule in the morning.   . RESTASIS 0.05 % ophth Place 1 drop  both eyes 2  times daily as needed      Allergies  Allergen Reactions  . Hyzaar [Losartan Potassium-Hctz] Swelling     "messed up my sodium counts"and swelled lips  . Sudafed [Pseudoephedrine Hcl] Palpitations  . Levaquin [Levofloxacin In D5w] Diarrhea and Nausea Only  . Acyclovir And Related   . Biaxin [Clarithromycin] GI Upset  . Cantaloupe Extract Allergy Skin Test Nasal congestion, headaches, and sneezing  . Celexa [Citalopram]   . Flexeril [Cyclobenzaprine] "zombie-like" feeling  . Fosamax [Alendronate Sodium] GI upset  . Iohexol Hives     Patient stated she broke out in hive 20 yrs ago from IV contrast    . Losartan Swelling    Angioedema  . Meloxicam GI upset  . Norvasc [Amlodipine] Swelling and Other (See Comments)    Ankles swell  .  Other Nausea Only and Other (See Comments)    Iceburg lettuce- "made me dreadfully nauseous" Raw mushrooms only-  "made me dreadfully nauseous" (cooked ones are tolerated)  . Pseudoephedrine     Palpitations  . Zoloft [Sertraline Hcl] Other (See Comments)    Has no emotions at all     PMHx:   Past Medical History:  Diagnosis Date  . Anemia   . Anxiety   . Arthritis   . CLL (chronic lymphocytic leukemia) ([place]) dx'd ~ 2015  . Depression   . GERD (gastroesophageal reflux disease)   . Headache    "sinus headaches"  . History of kidney stones   . Hypertension   . OSA (obstructive sleep apnea)   . Osteoarthritis   . Pneumonia   . Prediabetes   . Recurrent sinus infections 08/02/2012  . Tubular adenoma of colon     Immunization History  Administered Date(s) Administered  . Fluad Quad(high Dose 65+) 10/16/2020  . Influenza, High Dose Seasonal PF 08/29/2017, 08/22/2018, 09/11/2019  . Influenza,inj,Quad PF,6+ Mos 09/15/2014, 08/13/2015   . Influenza 08/31/2013, 09/04/2014  . PFIZERSARS-COV-2 Vacc 01/16/2020, 02/10/2020, 10/16/2020  . Pneumococcal Conjugate-13 09/23/2015  . Pneumococcal Polysaccharide-23 08/29/2017  . Pneumococcal- 23 12/28/2010  . Td 07/05/2006, 08/22/2018    Past Surgical History:  Procedure Laterality Date  . ABDOMINAL HYSTERECTOMY  1980's   "endometrosis"  . APPENDECTOMY    . BREAST SURGERY    . CATARACT EXTRACTION, BILATERAL    . CHOLECYSTECTOMY  1985  . FUNCTIONAL ENDOSCOPIC SINUS SURGERY  1990's   "cause I kept having sinus infections"  . immunoglobulin treatment  2017  . LAPAROSCOPIC APPENDECTOMY N/A 06/04/2014   Procedure: APPENDECTOMY LAPAROSCOPIC;  Surgeon: Zenovia Jarred, MD;  Location: [facility];  Service: General;  Laterality: N/A;  . LUMBAR LAMINECTOMY/DECOMPRESSION MICRODISCECTOMY Left 11/01/2017   Procedure: Left Lumbar Four-Five Extraforaminal Microdiscectomy;  Surgeon: Eustace Moore, MD;  Location: [facility];  Service: Neurosurgery;  Laterality: Left;  . LYMPH NODE BIOPSY     "determined I had CLL"  . SHOULDER ARTHROSCOPY Right   . TEMPOROMANDIBULAR JOINT ARTHROPLASTY  1980's  . TONSILLECTOMY AND ADENOIDECTOMY  1950's    FHx:    Reviewed / unchanged  SHx:    Reviewed / unchanged   Systems Review:  Constitutional: Denies fever, chills, wt changes, headaches, insomnia, fatigue, night sweats, change in appetite. Eyes: Denies redness, blurred vision, diplopia, discharge, itchy, watery eyes.  ENT: Denies discharge, congestion, post nasal drip, epistaxis, sore throat, earache, hearing loss, dental pain, tinnitus, vertigo, sinus pain, snoring.  CV: Denies chest pain, palpitations, irregular heartbeat, syncope, dyspnea, diaphoresis, orthopnea, PND, claudication or edema. Respiratory: denies cough, dyspnea, DOE, pleurisy, hoarseness, laryngitis, wheezing.  Gastrointestinal: Denies dysphagia, odynophagia, heartburn, reflux, water brash, abdominal pain or cramps, nausea, vomiting, bloating,  diarrhea, constipation, hematemesis, melena, hematochezia  or hemorrhoids. Genitourinary: Denies dysuria, frequency, urgency, nocturia, hesitancy, discharge, hematuria or flank pain. Musculoskeletal: Denies arthralgias, myalgias, stiffness, jt. swelling, pain, limping or strain/sprain.  Skin: Denies pruritus, rash, hives, warts, acne, eczema or change in skin lesion(s). Neuro: No weakness, tremor, incoordination, spasms, paresthesia or pain. Psychiatric: Denies confusion, memory loss or sensory loss. Endo: Denies change in weight, skin or hair change.  Heme/Lymph: No excessive bleeding, bruising or enlarged lymph nodes.  Physical Exam  BP 124/88   Pulse 72   Temp (!) 97.5 F (36.4 C)   Resp 16   Ht 5\' 2"  (1.575 m)   Wt 205 lb 12.8 oz (93.4 kg)  SpO2 95%   BMI 37.64 kg/m   Appears  Over nourished, well groomed  and in no distress.  Eyes: PERRLA, EOMs, conjunctiva no swelling or erythema. Sinuses: No frontal/maxillary tenderness ENT/Mouth: EAC's clear, TM's nl w/o erythema, bulging. Nares clear w/o erythema, swelling, exudates. Oropharynx clear without erythema or exudates. Oral hygiene is good. Tongue normal, non obstructing. Hearing intact.  Neck: Supple. Thyroid not palpable. Car 2+/2+ without bruits, nodes or JVD. Chest: Respirations nl with BS clear & equal w/o rales, rhonchi, wheezing or stridor.  Cor: Heart sounds normal w/ regular rate and rhythm without sig. murmurs, gallops, clicks or rubs. Peripheral pulses normal and equal  without edema.  Abdomen: Soft & bowel sounds normal. Non-tender w/o guarding, rebound, hernias, masses or organomegaly.  Lymphatics: Unremarkable.  Musculoskeletal: Full ROM all peripheral extremities, joint stability, 5/5 strength and normal gait.  Skin: Warm, dry without exposed rashes, lesions or ecchymosis apparent.  Neuro: Cranial nerves intact, reflexes equal bilaterally. Sensory-motor testing grossly intact. Tendon reflexes grossly intact.   Pysch: Alert & oriented x 3.  Insight and judgement nl & appropriate. No ideations.  Assessment and Plan:  1. Essential hypertension  - Continue medication, monitor blood pressure at home.  - Continue DASH diet.  Reminder to go to the ER if any CP,  SOB, nausea, dizziness, severe HA, changes vision/speech.  - CBC with Differential/Platelet - COMPLETE METABOLIC PANEL WITH GFR - Magnesium - TSH  2. Hyperlipidemia, mixed  - Continue diet/meds, exercise,& lifestyle modifications.  - Continue monitor periodic cholesterol/liver & renal functions   - Lipid panel - TSH  3. Abnormal glucose  - Continue diet, exercise  - Lifestyle modifications.  - Monitor appropriate labs  - Hemoglobin A1c - Insulin, random  4. Vitamin D deficiency  - Continue supplementation.  - VITAMIN D 25 Hydroxy]  5. CLL (chronic lymphocytic leukemia) ([facility])  - CBC with Differential/Platelet  6. Chronic Anxiety & Depression  - Add Rx Wellbutrin 150 mg XL to compliment her Prozac 20 mg and  Also Rx Trazodone 150 mg x 1/2 -1 for Sleep  - Advised 1 month f/u to re-evaluate  7. Class 2 obesity due to excess calories with body mass index (BMI)  of 37.0 to 37.9 in adult, unspecified whether serious comorbidity present  - TSH   8. Medication management  - CBC with Differential/Platelet - COMPLETE METABOLIC PANEL WITH GFR - Magnesium - Lipid panel - TSH - Hemoglobin A1c - Insulin, random - VITAMIN D 25 Hydroxy        Discussed  regular exercise, BP monitoring, weight control to achieve/maintain BMI less than 25 and discussed med and SE's. Recommended labs to assess and monitor clinical status with further disposition pending results of labs.  I discussed the assessment and treatment plan with the patient. The patient was provided an opportunity to ask questions and all were answered. The patient agreed with the plan and demonstrated an understanding of the instructions.  I provided over 30  minutes of exam, counseling, chart review and  complex critical decision making.       The patient was advised to call back or seek an in-person evaluation if the symptoms worsen or if the condition fails to improve as anticipated.   Kirtland Bouchard, MD

## 2021-02-11 LAB — LIPID PANEL
Cholesterol: 212 mg/dL — ABNORMAL HIGH (ref <200)
HDL: 57 mg/dL (ref >=50)
LDL Cholesterol (Calc): 128 mg/dL (calc) — ABNORMAL HIGH
Non-HDL Cholesterol (Calc): 155 mg/dL (calc) — ABNORMAL HIGH (ref <130)
Total CHOL/HDL Ratio: 3.7 (calc) (ref <5.0)
Triglycerides: 157 mg/dL — ABNORMAL HIGH (ref <150)

## 2021-02-11 LAB — CBC WITH DIFFERENTIAL/PLATELET
Absolute Monocytes: 3695 cells/uL — ABNORMAL HIGH (ref 200–950)
Basophils Absolute: 74 cells/uL (ref 0–200)
Basophils Relative: 0.1 %
Eosinophils Absolute: 222 cells/uL (ref 15–500)
Eosinophils Relative: 0.3 %
HCT: 34.7 % — ABNORMAL LOW (ref 35.0–45.0)
Hemoglobin: 11.8 g/dL (ref 11.7–15.5)
Lymphs Abs: 63850 cells/uL — ABNORMAL HIGH (ref 850–3900)
MCH: 33.1 pg — ABNORMAL HIGH (ref 27.0–33.0)
MCHC: 34 g/dL (ref 32.0–36.0)
MCV: 97.5 fL (ref 80.0–100.0)
MPV: 9.6 fL (ref 7.5–12.5)
Monocytes Relative: 5 %
Neutro Abs: 6060 cells/uL (ref 1500–7800)
Neutrophils Relative %: 8.2 %
Platelets: 87 10*3/uL — ABNORMAL LOW (ref 140–400)
RBC: 3.56 10*6/uL — ABNORMAL LOW (ref 3.80–5.10)
RDW: 12.8 % (ref 11.0–15.0)
Total Lymphocyte: 86.4 %
WBC: 73.9 10*3/uL — ABNORMAL HIGH (ref 3.8–10.8)

## 2021-02-11 LAB — COMPLETE METABOLIC PANEL WITH GFR
AG Ratio: 2.8 (calc) — ABNORMAL HIGH (ref 1.0–2.5)
ALT: 19 U/L (ref 6–29)
AST: 27 U/L (ref 10–35)
Albumin: 4.8 g/dL (ref 3.6–5.1)
Alkaline phosphatase (APISO): 63 U/L (ref 37–153)
BUN: 18 mg/dL (ref 7–25)
CO2: 23 mmol/L (ref 20–32)
Calcium: 9.5 mg/dL (ref 8.6–10.4)
Chloride: 96 mmol/L — ABNORMAL LOW (ref 98–110)
Creat: 0.84 mg/dL (ref 0.60–0.93)
GFR, Est African American: 79 mL/min/{1.73_m2} (ref >=60)
GFR, Est Non African American: 68 mL/min/{1.73_m2} (ref >=60)
Globulin: 1.7 g/dL (calc) — ABNORMAL LOW (ref 1.9–3.7)
Glucose, Bld: 105 mg/dL — ABNORMAL HIGH (ref 65–99)
Potassium: 4.1 mmol/L (ref 3.5–5.3)
Sodium: 133 mmol/L — ABNORMAL LOW (ref 135–146)
Total Bilirubin: 0.9 mg/dL (ref 0.2–1.2)
Total Protein: 6.5 g/dL (ref 6.1–8.1)

## 2021-02-11 LAB — HEMOGLOBIN A1C
Hgb A1c MFr Bld: 5.3 % of total Hgb (ref <5.7)
Mean Plasma Glucose: 105 mg/dL
eAG (mmol/L): 5.8 mmol/L

## 2021-02-11 LAB — VITAMIN D 25 HYDROXY (VIT D DEFICIENCY, FRACTURES): Vit D, 25-Hydroxy: 75 ng/mL (ref 30–100)

## 2021-02-11 LAB — TSH: TSH: 3.91 mIU/L (ref 0.40–4.50)

## 2021-02-11 LAB — MAGNESIUM: Magnesium: 1.4 mg/dL — ABNORMAL LOW (ref 1.5–2.5)

## 2021-02-11 LAB — INSULIN, RANDOM: Insulin: 15 u[IU]/mL

## 2021-02-11 NOTE — Progress Notes (Signed)
============================================================  ============================================================  -    CBC - Stable  - Hgb / Red cell Count 11.8 gm% -Stable - WBC still elevated  ~ 74,000 ~ about where it's been running the last 6 -- 8 years  ============================================================ ============================================================  -  Total  Chol is up from 179  to   now 212 - elevated       (Ideal or Goal is less than 180 )  - and   - Bad /Dangerous LDL Chol is up from 103  to now 128 - elevated       (Ideal or Goal is less than 70 )   - Be sure taking your Zetia and - Recommend  a stricter low cholesterol diet   - Cholesterol only comes from animal sources  - ie. meat, dairy, egg yolks  - Eat all the vegetables you want.  - Avoid meat, especially red meat - Beef AND Pork .  - Avoid cheese & dairy - milk & ice cream.     - Cheese is the most concentrated form of trans-fats which  is the worst thing to clog up our arteries.   - Veggie cheese is OK which can be found in the fresh  produce section at [employer] or [employer] or [employer] ============================================================ ============================================================  -  A1c = 5.3% - Normal - great  - No Diabetes  ! ============================================================ ============================================================  -  Vitamin D = 75 - Excellent  ============================================================ ============================================================  -  All Else - CBC - Kidneys - Electrolytes - Liver - Magnesium & Thyroid    - all  Normal / OK ===========================================================  ===========================================================

## 2021-02-15 NOTE — Progress Notes (Signed)
Patient is aware of lab results and instructions. -e welch

## 2021-03-16 NOTE — Progress Notes (Signed)
Future Appointments  Date Time Provider [place]  03/17/2021  4:30 PM [name], MD [facility] None  05/13/2021  CPE  2:00 PM [name], MD [facility] None  05/26/2021  2:30 PM [facility] None  05/26/2021  3:00 PM Brunetta Genera, MD [facility] None    History of Present Illness:    Patient  is a delightyful 75 yo MWF with chronic anxiety and depression who Is returning for 1 month f/u after adding Wellbutrin 150 XL to compliment her Prozac. She was also rx'd Trazodone for Sleep. She reports feeling somewhat better - still with some break-thru anxiety episodes.     Medications :  .  levothyroxine (SYNTHROID) 50 MCG tablet, Take 1 tablet (50 mcg total) by mouth daily.  .  bisoprolol-hydrochlorothiazide (ZIAC) 10-6.25 MG tablet, Take 1 tablet Daily for BP  .  ezetimibe (ZETIA) 10 MG tablet, Take  1 tablet  Daily  for Cholesterol   .  ALPRAZolam (XANAX) 1 MG tablet, Take 1/2 - 1 tablet 2 - 3 x /day ONLY  if needed f  .  Ascorbic Acid (VITAMIN C) 1000 MG tablet, Take 1,500 mg by mouth daily.   [name] Kitchen  buPROPion (WELLBUTRIN XL) 150 MG 24 hr tablet, Take  1 tablet  every Morning  for Mood /Depression, Focus & Concentration  .  Cholecalciferol (VITAMIN D3) 250 MCG (10000 UT) TABS, Take 10,000 Units by mouth in the morning.  [name] Kitchen  FLUoxetine (PROZAC) 20 MG capsule, Take     1 capsule     Daily       for Mood  .  hyoscyamine (LEVSIN SL) 0.125 MG SL tablet, Place 0.125 mg under the tongue every 4 (four) hours as needed for cramping.  .  Probiotic Product (PROBIOTIC PO), Take 1 capsule by mouth in the morning. \  .  RESTASIS 0.05 % ophth , Place 1 drop into eyes 2  times daily as needed (seasonal allergies\).   [name] Kitchen  traZODone (DESYREL) 150 MG tablet, Take  1/2 to 1 tablet  1 hour before Bedtime as needed for Sleep  Problem list She has CLL (chronic lymphocytic leukemia) ([place]); Hepatitis A; Class 2 obesity due to excess calories with body mass index  (BMI) of 37.0 to 37.9 in adult; Angioedema secondary to ACE/ARB; Abnormal glucose; Depression, major, in remission ([place]); GERD (gastroesophageal reflux disease); Recurrent sinus infections; OSA (obstructive sleep apnea); HTN (hypertension); Vitamin D deficiency; Hypogammaglobulinemia ([place]); Mixed hyperlipidemia; Medication management; Environmental and seasonal allergies; BMI 37.85,   adult; Immunocompromised ([place]); Thrombocytopenia ([place]); S/P lumbar laminectomy; Counseling regarding advance care planning and goals of care; Chronic bronchitis, unspecified chronic bronchitis type ([place]); Atherosclerosis of aorta ([place]); 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   Observations/Objective:  BP 134/74   Pulse 72   Temp 97.7 F (36.5 C) (Temporal)   Resp 18   Wt 203 lb 9.6 oz (92.4 kg)   SpO2 97%   BMI 37.24 kg/m   HEENT - WNL. Neck - supple.  Chest - Clear equal BS. Cor - Nl HS. RRR w/o sig MGR. PP 1(+). No edema. MS- FROM w/o deformities.  Gait Nl. Neuro -  Nl w/o focal abnormalities. Psyche- Stable w/o delusions, SI,  ideations.   Assessment and Plan:  1. Essential hypertension  2. Chronic anxiety  3. Insomnia secondary to depression with anxiety   Follow Up Instructions:  I discussed the assessment and treatment plan with the patient.  Discussed meds & SE's & expectations. The patient was provided an opportunity to ask questions and all were answered. The patient agreed with the plan and demonstrated an understanding of the instructions.        The patient was advised to call back or seek an in-person evaluation if the symptoms worsen or if the condition fails to improve as anticipated.   Kirtland Bouchard, MD

## 2021-03-17 ENCOUNTER — Encounter: Payer: Self-pay | Admitting: Internal Medicine

## 2021-03-17 ENCOUNTER — Ambulatory Visit (INDEPENDENT_AMBULATORY_CARE_PROVIDER_SITE_OTHER): Payer: Medicare Other | Admitting: Internal Medicine

## 2021-03-17 ENCOUNTER — Other Ambulatory Visit: Payer: Self-pay

## 2021-03-17 VITALS — BP 134/74 | HR 72 | Temp 97.7°F | Resp 18 | Ht 62.0 in | Wt 203.6 lb

## 2021-03-17 DIAGNOSIS — I10 Essential (primary) hypertension: Secondary | ICD-10-CM | POA: Diagnosis not present

## 2021-03-17 DIAGNOSIS — F418 Other specified anxiety disorders: Secondary | ICD-10-CM

## 2021-03-17 DIAGNOSIS — F5105 Insomnia due to other mental disorder: Secondary | ICD-10-CM | POA: Diagnosis not present

## 2021-03-17 DIAGNOSIS — F419 Anxiety disorder, unspecified: Secondary | ICD-10-CM | POA: Diagnosis not present

## 2021-04-05 ENCOUNTER — Telehealth: Payer: Self-pay | Admitting: Hematology

## 2021-04-05 NOTE — Telephone Encounter (Signed)
R/s per 02/23, Patient aware

## 2021-04-13 ENCOUNTER — Other Ambulatory Visit: Payer: Self-pay | Admitting: *Deleted

## 2021-04-13 ENCOUNTER — Other Ambulatory Visit: Payer: Self-pay | Admitting: Internal Medicine

## 2021-04-13 DIAGNOSIS — I10 Essential (primary) hypertension: Secondary | ICD-10-CM

## 2021-04-13 DIAGNOSIS — F325 Major depressive disorder, single episode, in full remission: Secondary | ICD-10-CM

## 2021-04-13 MED ORDER — FLUOXETINE HCL 20 MG PO CAPS: ORAL_CAPSULE | ORAL | 0 refills | Status: DC

## 2021-04-16 ENCOUNTER — Other Ambulatory Visit: Payer: Self-pay | Admitting: Internal Medicine

## 2021-04-16 DIAGNOSIS — F325 Major depressive disorder, single episode, in full remission: Secondary | ICD-10-CM

## 2021-04-28 ENCOUNTER — Encounter: Payer: Medicare Other | Admitting: Internal Medicine

## 2021-05-09 ENCOUNTER — Other Ambulatory Visit: Payer: Self-pay | Admitting: Internal Medicine

## 2021-05-09 DIAGNOSIS — F5105 Insomnia due to other mental disorder: Secondary | ICD-10-CM

## 2021-05-09 DIAGNOSIS — F418 Other specified anxiety disorders: Secondary | ICD-10-CM

## 2021-05-10 ENCOUNTER — Other Ambulatory Visit: Payer: Self-pay | Admitting: Internal Medicine

## 2021-05-12 ENCOUNTER — Other Ambulatory Visit: Payer: Self-pay | Admitting: Internal Medicine

## 2021-05-12 DIAGNOSIS — F418 Other specified anxiety disorders: Secondary | ICD-10-CM

## 2021-05-12 DIAGNOSIS — F5105 Insomnia due to other mental disorder: Secondary | ICD-10-CM

## 2021-05-12 DIAGNOSIS — F331 Major depressive disorder, recurrent, moderate: Secondary | ICD-10-CM

## 2021-05-12 DIAGNOSIS — F419 Anxiety disorder, unspecified: Secondary | ICD-10-CM

## 2021-05-13 ENCOUNTER — Ambulatory Visit (INDEPENDENT_AMBULATORY_CARE_PROVIDER_SITE_OTHER): Payer: Medicare Other | Admitting: Internal Medicine

## 2021-05-13 ENCOUNTER — Other Ambulatory Visit: Payer: Self-pay

## 2021-05-13 ENCOUNTER — Encounter: Payer: Self-pay | Admitting: Internal Medicine

## 2021-05-13 VITALS — BP 134/74 | HR 65 | Temp 97.5°F | Resp 16 | Ht 62.0 in | Wt 201.8 lb

## 2021-05-13 DIAGNOSIS — C9110 Chronic lymphocytic leukemia of B-cell type not having achieved remission: Secondary | ICD-10-CM

## 2021-05-13 DIAGNOSIS — Z87891 Personal history of nicotine dependence: Secondary | ICD-10-CM

## 2021-05-13 DIAGNOSIS — Z1211 Encounter for screening for malignant neoplasm of colon: Secondary | ICD-10-CM

## 2021-05-13 DIAGNOSIS — Z0001 Encounter for general adult medical examination with abnormal findings: Secondary | ICD-10-CM

## 2021-05-13 DIAGNOSIS — R7309 Other abnormal glucose: Secondary | ICD-10-CM

## 2021-05-13 DIAGNOSIS — I700 Atherosclerosis of aorta: Secondary | ICD-10-CM

## 2021-05-13 DIAGNOSIS — E559 Vitamin D deficiency, unspecified: Secondary | ICD-10-CM

## 2021-05-13 DIAGNOSIS — Z0000 Encounter for general adult medical examination without abnormal findings: Secondary | ICD-10-CM

## 2021-05-13 DIAGNOSIS — Z136 Encounter for screening for cardiovascular disorders: Secondary | ICD-10-CM | POA: Diagnosis not present

## 2021-05-13 DIAGNOSIS — I10 Essential (primary) hypertension: Secondary | ICD-10-CM | POA: Diagnosis not present

## 2021-05-13 DIAGNOSIS — E782 Mixed hyperlipidemia: Secondary | ICD-10-CM

## 2021-05-13 DIAGNOSIS — G720 Drug-induced myopathy: Secondary | ICD-10-CM

## 2021-05-13 DIAGNOSIS — Z79899 Other long term (current) drug therapy: Secondary | ICD-10-CM

## 2021-05-13 DIAGNOSIS — Z8249 Family history of ischemic heart disease and other diseases of the circulatory system: Secondary | ICD-10-CM

## 2021-05-13 NOTE — Patient Instructions (Signed)
Due to recent changes in healthcare laws, you may see the results of your imaging and laboratory studies on MyChart before your provider has had a chance to review them.  We understand that in some cases there may be results that are confusing or concerning to you. Not all laboratory results come back in the same time frame and the provider may be waiting for multiple results in order to interpret others.  Please give [place] 48 hours in order for your provider to thoroughly review all the results before contacting the office for clarification of your results.   +++++++++++++++++++++++++  Vit D  & Vit C 1,000 mg   are recommended to help protect  against the Covid-19 and other Corona viruses.    Also it's recommended  to take  Zinc 50 mg  to help  protect against the Covid-19   and best place to get  is also on [email or web address]  and don't pay more than 6-8 cents /pill !  ================================ Coronavirus (COVID-19) Are you at risk?  Are you at risk for the Coronavirus (COVID-19)?  To be considered HIGH RISK for Coronavirus (COVID-19), you have to meet the following criteria:  . Traveled to [place], [place], [place], [place] or [place]; or in the [place] to [place], [place], [place]  . or [place]; and have fever, cough, and shortness of breath within the last 2 weeks of travel OR . Been in close contact with a person diagnosed with COVID-19 within the last 2 weeks and have  . fever, cough,and shortness of breath .  . IF YOU DO NOT MEET THESE CRITERIA, YOU ARE CONSIDERED LOW RISK FOR COVID-19.  What to do if you are HIGH RISK for COVID-19?  [name] Kitchen If you are having a medical emergency, call 911. . Seek medical care right away. Before you go to a doctor's office, urgent care or emergency department, .  call ahead and tell them about your recent travel, contact with someone diagnosed with COVID-19  .  and your symptoms.  . You should receive instructions from your physician's  office regarding next steps of care.  . When you arrive at healthcare provider, tell the healthcare staff immediately you have returned from  . visiting [place], [place], [place], [place] or [place]; or traveled in the [place] to [place], [place],  . [place] or [place] in the last two weeks or you have been in close contact with a person diagnosed with  . COVID-19 in the last 2 weeks.   . Tell the health care staff about your symptoms: fever, cough and shortness of breath. . After you have been seen by a medical provider, you will be either: o Tested for (COVID-19) and discharged home on quarantine except to seek medical care if  o symptoms worsen, and asked to  - Stay home and avoid contact with others until you get your results (4-5 days)  - Avoid travel on public transportation if possible (such as bus, train, or airplane) or o Sent to the Emergency Department by EMS for evaluation, COVID-19 testing  and  o possible admission depending on your condition and test results.  What to do if you are LOW RISK for COVID-19?  Reduce your risk of any infection by using the same precautions used for avoiding the common cold or flu:  [name] Kitchen Wash your hands often with soap and warm water for at least 20 seconds.  If soap and water are not readily  available,  . use an alcohol-based hand sanitizer with at least 60% alcohol.  . If coughing or sneezing, cover your mouth and nose by coughing or sneezing into the elbow areas of your shirt or coat, .  into a tissue or into your sleeve (not your hands). . Avoid shaking hands with others and consider head nods or verbal greetings only. . Avoid touching your eyes, nose, or mouth with unwashed hands.  . Avoid close contact with people who are sick. . Avoid places or events with large numbers of people in one location, like concerts or sporting events. . Carefully consider travel plans you have or are making. . If you are planning any travel outside or  inside the [place], visit the CDC's Travelers' Health webpage for the latest health notices. . If you have some symptoms but not all symptoms, continue to monitor at home and seek medical attention  . if your symptoms worsen. . If you are having a medical emergency, call 911.   . >>>>>>>>>>>>>>>>>>>>>>>>>>>>>>>>> . We Do NOT Approve of  [facility], [employer]  Our Patients  To Do Home Visits & We Do NOT Approve of [employer] > > > > > > > > > > > > > > > > > > > > > > > > > > > > > > > > > > > > > > >  Preventive Care for Adults  A healthy lifestyle and preventive care can promote health and wellness. Preventive health guidelines for women include the following key practices.  A routine yearly physical is a good way to check with your health care provider about your health and preventive screening. It is a chance to share any concerns and updates on your health and to receive a thorough exam.  Visit your dentist for a routine exam and preventive care every 6 months. Brush your teeth twice a day and floss once a day. Good oral hygiene prevents tooth decay and gum disease.  The frequency of eye exams is based on your age, health, family medical history, use of contact lenses, and other factors. Follow your health care provider's recommendations for frequency of eye exams.  Eat a healthy diet. Foods like vegetables, fruits, whole grains, low-fat dairy products, and lean protein foods contain the nutrients you need without too many calories. Decrease your intake of foods high in solid fats, added sugars, and salt. Eat the right amount of calories for you. Get information about a proper diet from your health care provider, if necessary.  Regular physical exercise is one of the most important things you can do for your health. Most adults should get at least 150 minutes of moderate-intensity exercise (any activity that increases your heart rate and causes you to sweat) each  week. In addition, most adults need muscle-strengthening exercises on 2 or more days a week.  Maintain a healthy weight. The body mass index (BMI) is a screening tool to identify possible weight problems. It provides an estimate of body fat based on height and weight. Your health care provider can find your BMI and can help you achieve or maintain a healthy weight. For adults 20 years and older:  A BMI below 18.5 is considered underweight.  A BMI of 18.5 to 24.9 is normal.  A BMI of 25 to 29.9 is considered overweight.  A BMI of 30 and above is considered obese.  Maintain normal blood lipids and cholesterol levels by exercising and minimizing your intake of  saturated fat. Eat a balanced diet with plenty of fruit and vegetables. If your lipid or cholesterol levels are high, you are over 50, or you are at high risk for heart disease, you may need your cholesterol levels checked more frequently. Ongoing high lipid and cholesterol levels should be treated with medicines if diet and exercise are not working.  If you smoke, find out from your health care provider how to quit. If you do not use tobacco, do not start.  Lung cancer screening is recommended for adults aged 78-80 years who are at high risk for developing lung cancer because of a history of smoking. A yearly low-dose CT scan of the lungs is recommended for people who have at least a 30-pack-year history of smoking and are a current smoker or have quit within the past 15 years. A pack year of smoking is smoking an average of 1 pack of cigarettes a day for 1 year (for example: 1 pack a day for 30 years or 2 packs a day for 15 years). Yearly screening should continue until the smoker has stopped smoking for at least 15 years. Yearly screening should be stopped for people who develop a health problem that would prevent them from having lung cancer treatment.  Avoid use of street drugs. Do not share needles with anyone. Ask for help if you need  support or instructions about stopping the use of drugs.  High blood pressure causes heart disease and increases the risk of stroke.  Ongoing high blood pressure should be treated with medicines if weight loss and exercise do not work.  If you are 14-45 years old, ask your health care provider if you should take aspirin to prevent strokes.  Diabetes screening involves taking a blood sample to check your fasting blood sugar level. This should be done once every 3 years, after age 40, if you are within normal weight and without risk factors for diabetes. Testing should be considered at a younger age or be carried out more frequently if you are overweight and have at least 1 risk factor for diabetes.  Breast cancer screening is essential preventive care for women. You should practice "breast self-awareness." This means understanding the normal appearance and feel of your breasts and may include breast self-examination. Any changes detected, no matter how small, should be reported to a health care provider. Women in their 18s and 30s should have a clinical breast exam (CBE) by a health care provider as part of a regular health exam every 1 to 3 years. After age 71, women should have a CBE every year. Starting at age 39, women should consider having a mammogram (breast X-ray test) every year. Women who have a family history of breast cancer should talk to their health care provider about genetic screening. Women at a high risk of breast cancer should talk to their health care providers about having an MRI and a mammogram every year.  Breast cancer gene (BRCA)-related cancer risk assessment is recommended for women who have family members with BRCA-related cancers. BRCA-related cancers include breast, ovarian, tubal, and peritoneal cancers. Having family members with these cancers may be associated with an increased risk for harmful changes (mutations) in the breast cancer genes BRCA1 and BRCA2. Results of the  assessment will determine the need for genetic counseling and BRCA1 and BRCA2 testing.  Routine pelvic exams to screen for cancer are no longer recommended for nonpregnant women who are considered low risk for cancer of the pelvic organs (ovaries,  uterus, and vagina) and who do not have symptoms. Ask your health care provider if a screening pelvic exam is right for you.  If you have had past treatment for cervical cancer or a condition that could lead to cancer, you need Pap tests and screening for cancer for at least 20 years after your treatment. If Pap tests have been discontinued, your risk factors (such as having a new sexual partner) need to be reassessed to determine if screening should be resumed. Some women have medical problems that increase the chance of getting cervical cancer. In these cases, your health care provider may recommend more frequent screening and Pap tests.    Colorectal cancer can be detected and often prevented. Most routine colorectal cancer screening begins at the age of 58 years and continues through age 78 years. However, your health care provider may recommend screening at an earlier age if you have risk factors for colon cancer. On a yearly basis, your health care provider may provide home test kits to check for hidden blood in the stool. Use of a small camera at the end of a tube, to directly examine the colon (sigmoidoscopy or colonoscopy), can detect the earliest forms of colorectal cancer. Talk to your health care provider about this at age 72, when routine screening begins.  Direct exam of the colon should be repeated every 5-10 years through age 14 years, unless early forms of pre-cancerous polyps or small growths are found.  Osteoporosis is a disease in which the bones lose minerals and strength with aging. This can result in serious bone fractures or breaks. The risk of osteoporosis can be identified using a bone density scan. Women ages 10 years and over and women  at risk for fractures or osteoporosis should discuss screening with their health care providers. Ask your health care provider whether you should take a calcium supplement or vitamin D to reduce the rate of osteoporosis.  Menopause can be associated with physical symptoms and risks. Hormone replacement therapy is available to decrease symptoms and risks. You should talk to your health care provider about whether hormone replacement therapy is right for you.  Use sunscreen. Apply sunscreen liberally and repeatedly throughout the day. You should seek shade when your shadow is shorter than you. Protect yourself by wearing long sleeves, pants, a wide-brimmed hat, and sunglasses year round, whenever you are outdoors.  Once a month, do a whole body skin exam, using a mirror to look at the skin on your back. Tell your health care provider of new moles, moles that have irregular borders, moles that are larger than a pencil eraser, or moles that have changed in shape or color.  Stay current with required vaccines (immunizations).  Influenza vaccine. All adults should be immunized every year.  Tetanus, diphtheria, and acellular pertussis (Td, Tdap) vaccine. Pregnant women should receive 1 dose of Tdap vaccine during each pregnancy. The dose should be obtained regardless of the length of time since the last dose. Immunization is preferred during the 27th-36th week of gestation. An adult who has not previously received Tdap or who does not know her vaccine status should receive 1 dose of Tdap. This initial dose should be followed by tetanus and diphtheria toxoids (Td) booster doses every 10 years. Adults with an unknown or incomplete history of completing a 3-dose immunization series with Td-containing vaccines should begin or complete a primary immunization series including a Tdap dose. Adults should receive a Td booster every 10 years.  Zoster vaccine. One dose is recommended for adults aged 64 years or  older unless certain conditions are present.    Pneumococcal 13-valent conjugate (PCV13) vaccine. When indicated, a person who is uncertain of her immunization history and has no record of immunization should receive the PCV13 vaccine. An adult aged 61 years or older who has certain medical conditions and has not been previously immunized should receive 1 dose of PCV13 vaccine. This PCV13 should be followed with a dose of pneumococcal polysaccharide (PPSV23) vaccine. The PPSV23 vaccine dose should be obtained at least 1 or more year(s) after the dose of PCV13 vaccine. An adult aged 46 years or older who has certain medical conditions and previously received 1 or more doses of PPSV23 vaccine should receive 1 dose of PCV13. The PCV13 vaccine dose should be obtained 1 or more years after the last PPSV23 vaccine dose.    Pneumococcal polysaccharide (PPSV23) vaccine. When PCV13 is also indicated, PCV13 should be obtained first. All adults aged 58 years and older should be immunized. An adult younger than age 1 years who has certain medical conditions should be immunized. Any person who resides in a nursing home or long-term care facility should be immunized. An adult smoker should be immunized. People with an immunocompromised condition and certain other conditions should receive both PCV13 and PPSV23 vaccines. People with human immunodeficiency virus (HIV) infection should be immunized as soon as possible after diagnosis. Immunization during chemotherapy or radiation therapy should be avoided. Routine use of PPSV23 vaccine is not recommended for American Indians, [place] Natives, or people younger than 65 years unless there are medical conditions that require PPSV23 vaccine. When indicated, people who have unknown immunization and have no record of immunization should receive PPSV23 vaccine. One-time revaccination 5 years after the first dose of PPSV23 is recommended for people aged 19-64 years who have chronic  kidney failure, nephrotic syndrome, asplenia, or immunocompromised conditions. People who received 1-2 doses of PPSV23 before age 11 years should receive another dose of PPSV23 vaccine at age 70 years or later if at least 5 years have passed since the previous dose. Doses of PPSV23 are not needed for people immunized with PPSV23 at or after age 65 years.   Preventive Services / Frequency  Ages 56 years and over  Blood pressure check.  Lipid and cholesterol check.  Lung cancer screening. / Every year if you are aged 16-80 years and have a 30-pack-year history of smoking and currently smoke or have quit within the past 15 years. Yearly screening is stopped once you have quit smoking for at least 15 years or develop a health problem that would prevent you from having lung cancer treatment.  Clinical breast exam.** / Every year after age 59 years.   BRCA-related cancer risk assessment.** / For women who have family members with a BRCA-related cancer (breast, ovarian, tubal, or peritoneal cancers).  Mammogram.** / Every year beginning at age 70 years and continuing for as long as you are in good health. Consult with your health care provider.  Pap test.** / Every 3 years starting at age 32 years through age 28 or 9 years with 3 consecutive normal Pap tests. Testing can be stopped between 65 and 70 years with 3 consecutive normal Pap tests and no abnormal Pap or HPV tests in the past 10 years.  Fecal occult blood test (FOBT) of stool. / Every year beginning at age 57 years and continuing until age 29 years. You may not need to  do this test if you get a colonoscopy every 10 years.  Flexible sigmoidoscopy or colonoscopy.** / Every 5 years for a flexible sigmoidoscopy or every 10 years for a colonoscopy beginning at age 21 years and continuing until age 56 years.  Hepatitis C blood test.** / For all people born from [birth year] through 1965 and any individual with known risks for hepatitis  C.  Osteoporosis screening.** / A one-time screening for women ages 53 years and over and women at risk for fractures or osteoporosis.  Skin self-exam. / Monthly.  Influenza vaccine. / Every year.  Tetanus, diphtheria, and acellular pertussis (Tdap/Td) vaccine.** / 1 dose of Td every 10 years.  Zoster vaccine.** / 1 dose for adults aged 32 years or older.  Pneumococcal 13-valent conjugate (PCV13) vaccine.** / Consult your health care provider.  Pneumococcal polysaccharide (PPSV23) vaccine.** / 1 dose for all adults aged 67 years and older. Screening for abdominal aortic aneurysm (AAA)  by ultrasound is recommended for people who have history of high blood pressure or who are current or former smokers. ++++++++++++++++++++ Recommend Adult Low Dose Aspirin or  coated  Aspirin 81 mg daily  To reduce risk of Colon Cancer 40 %,  Skin Cancer 26 % ,  Melanoma 46%  and  Pancreatic cancer 60% ++++++++++++++++++++ Vitamin D goal  is between 70-100.  Please make sure that you are taking your Vitamin D as directed.  It is very important as a natural anti-inflammatory  helping hair, skin, and nails, as well as reducing stroke and heart attack risk.  It helps your bones and helps with mood. It also decreases numerous cancer risks so please take it as directed.  Low Vit D is associated with a 200-300% higher risk for CANCER  and 200-300% higher risk for HEART   ATTACK  &  STROKE.   .....................................[name] Kitchen It is also associated with higher death rate at younger ages,  autoimmune diseases like Rheumatoid arthritis, Lupus, Multiple Sclerosis.    Also many other serious conditions, like depression, Alzheimer's Dementia, infertility, muscle aches, fatigue, fibromyalgia - just to name a few. ++++++++++++++++++ Recommend the book "The END of DIETING" by Dr Excell Seltzer  & the book "The END of DIABETES " by Dr Excell Seltzer At [email or web address] - get book & Audio CD's    Being diabetic has  a  300% increased risk for heart attack, stroke, cancer, and alzheimer- type vascular dementia. It is very important that you work harder with diet by avoiding all foods that are white. Avoid white rice (brown & wild rice is OK), white potatoes (sweetpotatoes in moderation is OK), White bread or wheat bread or anything made out of white flour like bagels, donuts, rolls, buns, biscuits, cakes, pastries, cookies, pizza crust, and pasta (made from white flour & egg whites) - vegetarian pasta or spinach or wheat pasta is OK. Multigrain breads like Arnold's or [employer], or multigrain sandwich thins or flatbreads.  Diet, exercise and weight loss can reverse and cure diabetes in the early stages.  Diet, exercise and weight loss is very important in the control and prevention of complications of diabetes which affects every system in your body, ie. Brain - dementia/stroke, eyes - glaucoma/blindness, heart - heart attack/heart failure, kidneys - dialysis, stomach - gastric paralysis, intestines - malabsorption, nerves - severe painful neuritis, circulation - gangrene & loss of a leg(s), and finally cancer and Alzheimers.    I recommend avoid fried & greasy foods,  sweets/candy, white rice (brown or wild  rice or Quinoa is OK), white potatoes (sweet potatoes are OK) - anything made from white flour - bagels, doughnuts, rolls, buns, biscuits,white and wheat breads, pizza crust and traditional pasta made of white flour & egg white(vegetarian pasta or spinach or wheat pasta is OK).  Multi-grain bread is OK - like multi-grain flat bread or sandwich thins. Avoid alcohol in excess. Exercise is also important.    Eat all the vegetables you want - avoid meat, especially red meat and dairy - especially cheese.  Cheese is the most concentrated form of trans-fats which is the worst thing to clog up our arteries. Veggie cheese is OK which can be found in the fresh produce section at [employer] or [employer] or  [employer]  +++++++++++++++++++ DASH Eating Plan  DASH stands for "Dietary Approaches to Stop Hypertension."   The DASH eating plan is a healthy eating plan that has been shown to reduce high blood pressure (hypertension). Additional health benefits may include reducing the risk of type 2 diabetes mellitus, heart disease, and stroke. The DASH eating plan may also help with weight loss. WHAT DO I NEED TO KNOW ABOUT THE DASH EATING PLAN? For the DASH eating plan, you will follow these general guidelines:  Choose foods with a percent daily value for sodium of less than 5% (as listed on the food label).  Use salt-free seasonings or herbs instead of table salt or sea salt.  Check with your health care provider or pharmacist before using salt substitutes.  Eat lower-sodium products, often labeled as "lower sodium" or "no salt added."  Eat fresh foods.  Eat more vegetables, fruits, and low-fat dairy products.  Choose whole grains. Look for the word "whole" as the first word in the ingredient list.  Choose fish   Limit sweets, desserts, sugars, and sugary drinks.  Choose heart-healthy fats.  Eat veggie cheese   Eat more home-cooked food and less restaurant, buffet, and fast food.  Limit fried foods.  Cook foods using methods other than frying.  Limit canned vegetables. If you do use them, rinse them well to decrease the sodium.  When eating at a restaurant, ask that your food be prepared with less salt, or no salt if possible.                      WHAT FOODS CAN I EAT? Read Dr Fara Olden Fuhrman's books on The End of Dieting & The End of Diabetes  Grains Whole grain or whole wheat bread. Brown rice. Whole grain or whole wheat pasta. Quinoa, bulgur, and whole grain cereals. Low-sodium cereals. Corn or whole wheat flour tortillas. Whole grain cornbread. Whole grain crackers. Low-sodium crackers.  Vegetables Fresh or frozen vegetables (raw, steamed, roasted, or grilled). Low-sodium or  reduced-sodium tomato and vegetable juices. Low-sodium or reduced-sodium tomato sauce and paste. Low-sodium or reduced-sodium canned vegetables.   Fruits All fresh, canned (in natural juice), or frozen fruits.  Protein Products  All fish and seafood.  Dried beans, peas, or lentils. Unsalted nuts and seeds. Unsalted canned beans.  Dairy Low-fat dairy products, such as skim or 1% milk, 2% or reduced-fat cheeses, low-fat ricotta or cottage cheese, or plain low-fat yogurt. Low-sodium or reduced-sodium cheeses.  Fats and Oils Tub margarines without trans fats. Light or reduced-fat mayonnaise and salad dressings (reduced sodium). Avocado. Safflower, olive, or canola oils. Natural peanut or almond butter.  Other Unsalted popcorn and pretzels. The items listed above may not be a complete list of recommended foods  or beverages. Contact your dietitian for more options.  +++++++++++++++  WHAT FOODS ARE NOT RECOMMENDED? Grains/ White flour or wheat flour White bread. White pasta. White rice. Refined cornbread. Bagels and croissants. Crackers that contain trans fat.  Vegetables  Creamed or fried vegetables. Vegetables in a . Regular canned vegetables. Regular canned tomato sauce and paste. Regular tomato and vegetable juices.  Fruits Dried fruits. Canned fruit in light or heavy syrup. Fruit juice.  Meat and Other Protein Products Meat in general - RED meat & White meat.  Fatty cuts of meat. Ribs, chicken wings, all processed meats as bacon, sausage, bologna, salami, fatback, hot dogs, bratwurst and packaged luncheon meats.  Dairy Whole or 2% milk, cream, half-and-half, and cream cheese. Whole-fat or sweetened yogurt. Full-fat cheeses or blue cheese. Non-dairy creamers and whipped toppings. Processed cheese, cheese spreads, or cheese curds.  Condiments Onion and garlic salt, seasoned salt, table salt, and sea salt. Canned and packaged gravies. Worcestershire sauce. Tartar sauce. Barbecue  sauce. Teriyaki sauce. Soy sauce, including reduced sodium. Steak sauce. Fish sauce. Oyster sauce. Cocktail sauce. Horseradish. Ketchup and mustard. Meat flavorings and tenderizers. Bouillon cubes. Hot sauce. Tabasco sauce. Marinades. Taco seasonings. Relishes.  Fats and Oils Butter, stick margarine, lard, shortening and bacon fat. Coconut, palm kernel, or palm oils. Regular salad dressings.  Pickles and olives. Salted popcorn and pretzels.  The items listed above may not be a complete list of foods and beverages to avoid.

## 2021-05-13 NOTE — Progress Notes (Signed)
Annual Screening/Preventative Visit & Comprehensive Evaluation &  Examination  Future Appointments  Date Time Provider [place]  05/13/2021  2:00 PM [name], MD [facility] None  06/28/2021  2:00 PM Brunetta Genera, MD [facility] None  05/16/2022  2:00 PM [name], MD [facility] None  (Patient since 1996)       This very nice 75 y.o. MWF presents for a Screening /Preventative Visit & comprehensive evaluation and management of multiple medical co-morbidities.  Patient has been followed for HTN, HLD, CLL, Prediabetes  and Vitamin D Deficiency. Patient has GERD controlled on her meds.         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        HTN predates since  1998.  Patient's BP has been controlled at home and patient denies any cardiac symptoms as chest pain, palpitations, shortness of breath, dizziness or ankle swelling. Today's BP is at goal -134/74 .       Patient has Statin intolerance and her hyperlipidemia is not controlled with diet and Ezetimibe.  Patient denies medication SE's. Last lipids were not at goal:  Lab Results  Component Value Date   CHOL 212 (H) 02/10/2021   HDL 57 02/10/2021   LDLCALC 128 (H) 02/10/2021   TRIG 157 (H) 02/10/2021   CHOLHDL 3.7 02/10/2021         Patient has hx/o prediabetes predating (A1c 6.0% / 2012 ) and patient denies reactive hypoglycemic symptoms, visual blurring, diabetic polys or paresthesias. Last A1c was normal & at goal:  Lab Results  Component Value Date   HGBA1C 5.3 02/10/2021         Finally, patient has history of Vitamin D Deficiency ("30" /2008) and last Vitamin D was  at goal:  Lab Results  Component Value Date   VD25OH 75 02/10/2021     Current Outpatient Medications on File Prior to Visit  Medication Sig   ALPRAZolam (XANAX) 1 MG tablet Take1/2 - 1 tablet 2 - 3 x /day only if needed   VITAMIN C 1000 MG tablet Take 1,500 mg by mouth daily.    bisoprolol-hctz  10-6.25 MG tablet Take  1 tablet  Daily for BP   buPROPion-XL 150 MG = TAKE 1 TABLET EVERY MORNING    VITAMIN D 10,000 u Take in the morning.   ezetimibe 10 MG tablet Take  1 tablet  Daily  for Cholesterol   FLUoxetine 20 MG capsule TAKE 1 CAPSULE DAILY   PROBIOTIC  Take 1 capsule in the morning.    RESTASIS 0.05 % ophth emulsion Place 1 drop into both eyes 2 times daily as needed    levothyroxine 50 MCG tablet Take 1 tablet  daily.      Allergies  Allergen Reactions   Hyzaar [Losartan Potassium-Hctz] Swelling and Other (See Comments)    "messed up my sodium counts"and swelled lips   Sudafed [Pseudoephedrine Hcl] Palpitations   Levaquin [Levofloxacin In D5w] Diarrhea and Nausea Only   Acyclovir And Related Other (See Comments)    Reaction not recalled   Biaxin [Clarithromycin]     GI Upset   Cantaloupe Extract Allergy Skin Test Other (See Comments)    Nasal congestion, headaches, and sneezing   Celexa [Citalopram] Other (See Comments)    Reaction not recalled   Flexeril [Cyclobenzaprine]     "zombie-like" feeling   Fosamax [Alendronate Sodium]     GI upset   Iohexol Hives and Other (See  Comments)    Patient stated she broke out in hive 20 yrs ago from IV contrast      Losartan Swelling and Other (See Comments)    Angioedema   Meloxicam     GI upset   Norvasc [Amlodipine] Swelling and Other (See Comments)    Ankles swell   Other Nausea Only and Other (See Comments)    Iceburg lettuce- "made me dreadfully nauseous" Raw mushrooms only-  "made me dreadfully nauseous" (cooked ones are tolerated)   Pseudoephedrine     Palpitations   Zoloft [Sertraline Hcl] Other (See Comments)    Has no emotions at all      Past Medical History:  Diagnosis Date   Anemia    Anxiety    Arthritis    CLL (chronic lymphocytic leukemia) ([place]) dx'd ~ 2015   Depression    GERD (gastroesophageal reflux disease)    Headache    "sinus headaches"   History of kidney stones    Hypertension     OSA (obstructive sleep apnea)    Osteoarthritis    Pneumonia    Prediabetes    Recurrent sinus infections 08/02/2012   Tubular adenoma of colon      Health Maintenance  Topic Date Due   Pneumococcal Vaccine 79-89 Years old (1 of 4 - PCV13) Never done   Zoster Vaccines- Shingrix (1 of 2) Never done   COVID-19 Vaccine (4 - Booster for Pfizer series) 01/16/2021   MAMMOGRAM  07/03/2021   INFLUENZA VACCINE  07/05/2021   COLONOSCOPY (Pts 45-36yrs Insurance coverage will need to be confirmed)  08/19/2025   TETANUS/TDAP  08/22/2028   DEXA SCAN  Completed   Hepatitis C Screening  Completed   PNA vac Low Risk Adult  Completed   HPV VACCINES  Aged Out     Immunization History  Administered Date(s) Administered   Fluad Quad(high Dose 65+) 10/16/2020   Influenza, High Dose Seasonal PF 08/29/2017, 08/22/2018, 09/11/2019   Influenza,inj,Quad PF,6+ Mos 09/15/2014, 08/13/2015   Influenza-Unspecified 08/31/2013, 09/04/2014   PFIZER(Purple Top)SARS-COV-2 Vaccination 01/16/2020, 02/10/2020, 10/16/2020   Pneumococcal Conjugate-13 09/23/2015   Pneumococcal Polysaccharide-23 08/29/2017   Pneumococcal-Unspecified 12/28/2010   Td 07/05/2006, 08/22/2018    Last Colon - 08/19/2020 - Dr Hilarie Fredrickson - Recc 5 yr f/u due Sept 2026   Last MGM - 07/03/2020   Past Surgical History:  Procedure Laterality Date   ABDOMINAL HYSTERECTOMY  1980's   "endometrosis"   APPENDECTOMY     BREAST SURGERY     CATARACT EXTRACTION, BILATERAL     CHOLECYSTECTOMY  1985   FUNCTIONAL ENDOSCOPIC SINUS SURGERY  1990's   "cause I kept having sinus infections"   immunoglobulin treatment  2017   LAPAROSCOPIC APPENDECTOMY N/A 06/04/2014   Procedure: APPENDECTOMY LAPAROSCOPIC;  Surgeon: Zenovia Jarred, MD;  Location: [place];  Service: General;  Laterality: N/A;   LUMBAR LAMINECTOMY/DECOMPRESSION MICRODISCECTOMY Left 11/01/2017   Procedure: Left Lumbar Four-Five Extraforaminal Microdiscectomy;  Surgeon: Eustace Moore, MD;   Location: [place];  Service: Neurosurgery;  Laterality: Left;   LYMPH NODE BIOPSY     "determined I had CLL"   SHOULDER ARTHROSCOPY Right    TEMPOROMANDIBULAR JOINT ARTHROPLASTY  1980's   TONSILLECTOMY AND ADENOIDECTOMY  57's    Family History  Problem Relation Age of Onset   Parkinson's disease Mother    Hypertension Mother    Hypertension Maternal Grandmother    Heart attack Maternal Grandmother        Mild   Heart attack  Brother    Arthritis Brother     Social History   Tobacco Use   Smoking status: Former Smoker    Packs/day: 0.75    Years: 4.00    Pack years: 3.00    Types: Cigarettes    Quit date: 12/05/1968    Years since quitting: 52.4   Smokeless tobacco: Never Used  Vaping Use   Vaping Use: Never used  Substance Use Topics   Alcohol use: Yes    Alcohol/week: 7.0 standard drinks    Types: 7 Glasses of wine per week   Drug use: No      ROS Constitutional: Denies fever, chills, weight loss/gain, headaches, insomnia,  night sweats, and change in appetite. Does c/o fatigue. Eyes: Denies redness, blurred vision, diplopia, discharge, itchy, watery eyes.  ENT: Denies discharge, congestion, post nasal drip, epistaxis, sore throat, earache, hearing loss, dental pain, Tinnitus, Vertigo, Sinus pain, snoring.  Cardio: Denies chest pain, palpitations, irregular heartbeat, syncope, dyspnea, diaphoresis, orthopnea, PND, claudication, edema Respiratory: denies cough, dyspnea, DOE, pleurisy, hoarseness, laryngitis, wheezing.  Gastrointestinal: Denies dysphagia, heartburn, reflux, water brash, pain, cramps, nausea, vomiting, bloating, diarrhea, constipation, hematemesis, melena, hematochezia, jaundice, hemorrhoids Genitourinary: Denies dysuria, frequency, urgency, nocturia, hesitancy, discharge, hematuria, flank pain Breast: Breast lumps, nipple discharge, bleeding.  Musculoskeletal: Denies arthralgia, myalgia, stiffness, Jt. Swelling, pain, limp, and strain/sprain. Denies  falls. Skin: Denies puritis, rash, hives, warts, acne, eczema, changing in skin lesion Neuro: No weakness, tremor, incoordination, spasms, paresthesia, pain Psychiatric: Denies confusion, memory loss, sensory loss. Denies Depression. Endocrine: Denies change in weight, skin, hair change, nocturia, and paresthesia, diabetic polys, visual blurring, hyper / hypo glycemic episodes.  Heme/Lymph: No excessive bleeding, bruising, enlarged lymph nodes.  Physical Exam  BP 134/74 (BP Location: Right Arm, Patient Position: Sitting, Cuff Size: Normal)   Pulse 65   Temp (!) 97.5 F (36.4 C)   Resp 16   Ht 5\' 2"  (1.575 m)   Wt 201 lb 12.8 oz (91.5 kg)   SpO2 99%   BMI 36.91 kg/m   General Appearance: Well nourished, well groomed and in no apparent distress.  Eyes: PERRLA, EOMs, conjunctiva no swelling or erythema, normal fundi and vessels. Sinuses: No frontal/maxillary tenderness ENT/Mouth: EACs patent / TMs  nl. Nares clear without erythema, swelling, mucoid exudates. Oral hygiene is good. No erythema, swelling, or exudate. Tongue normal, non-obstructing. Tonsils not swollen or erythematous. Hearing normal.  Neck: Supple, thyroid not palpable. No bruits, nodes or JVD. Respiratory: Respiratory effort normal.  BS equal and clear bilateral without rales, rhonci, wheezing or stridor. Cardio: Heart sounds are normal with regular rate and rhythm and no murmurs, rubs or gallops. Peripheral pulses are normal and equal bilaterally without edema. No aortic or femoral bruits. Chest: symmetric with normal excursions and percussion. Breasts: Symmetric, without lumps, nipple discharge, retractions, or fibrocystic changes.  Abdomen: Flat, soft with bowel sounds active. Nontender, no guarding, rebound, hernias, masses, or organomegaly.  Lymphatics: Non tender without lymphadenopathy.  Genitourinary:  Musculoskeletal: Full ROM all peripheral extremities, joint stability, 5/5 strength, and normal gait. Skin: Warm  and dry without rashes, lesions, cyanosis, clubbing or  ecchymosis.  Neuro: Cranial nerves intact, reflexes equal bilaterally. Normal muscle tone, no cerebellar symptoms. Sensation intact.  Pysch: Alert and oriented X 3, normal affect, Insight and Judgment appropriate.    Assessment and Plan  1. Annual Preventative Screening Examination   2. Essential hypertension  - EKG 12-Lead - CBC with Differential/Platelet - COMPLETE METABOLIC PANEL WITH GFR - Magnesium - TSH  3. Hyperlipidemia, mixed  - EKG 12-Lead - Lipid panel - TSH  4. Abnormal glucose  - EKG 12-Lead - Hemoglobin A1c - Insulin, random  5. Vitamin D deficiency  - VITAMIN D 25 Hydroxy   6. CLL (chronic lymphocytic leukemia) (HCC)  - CBC with Differential/Platelet  7. Statin myopathy   8. Atherosclerosis of aorta ([facility])  - EKG 12-Lead  9. Screening for ischemic heart disease  - EKG 12-Lead  10. FHx: heart disease  - EKG 12-Lead  11. Former smoker  - EKG 12-Lead  12. Medication management  - Urinalysis, Routine w reflex microscopic - Microalbumin / creatinine urine ratio - CBC with Differential/Platelet - COMPLETE METABOLIC PANEL WITH GFR - Magnesium - Lipid panel - TSH - Hemoglobin A1c - Insulin, random - VITAMIN D 25 Hydroxy   13. Screening for colorectal cancer  - POC Hemoccult Bld/Stl         Patient was counseled in prudent diet to achieve/maintain BMI less than 25 for weight control, BP monitoring, regular exercise and medications. Discussed med's effects and SE's. Screening labs and tests as requested with regular follow-up as recommended. Over 40 minutes of exam, counseling, chart review and high complex critical decision making was performed.   Kirtland Bouchard, MD

## 2021-05-14 ENCOUNTER — Encounter: Payer: Self-pay | Admitting: Hematology

## 2021-05-14 LAB — COMPLETE METABOLIC PANEL WITH GFR
AG Ratio: 2.7 (calc) — ABNORMAL HIGH (ref 1.0–2.5)
ALT: 30 U/L — ABNORMAL HIGH (ref 6–29)
AST: 37 U/L — ABNORMAL HIGH (ref 10–35)
Albumin: 4.6 g/dL (ref 3.6–5.1)
Alkaline phosphatase (APISO): 69 U/L (ref 37–153)
BUN/Creatinine Ratio: 15 (calc) (ref 6–22)
BUN: 17 mg/dL (ref 7–25)
CO2: 25 mmol/L (ref 20–32)
Calcium: 9.1 mg/dL (ref 8.6–10.4)
Chloride: 95 mmol/L — ABNORMAL LOW (ref 98–110)
Creat: 1.11 mg/dL — ABNORMAL HIGH (ref 0.60–0.93)
GFR, Est African American: 57 mL/min/{1.73_m2} — ABNORMAL LOW (ref >=60)
GFR, Est Non African American: 49 mL/min/{1.73_m2} — ABNORMAL LOW (ref >=60)
Globulin: 1.7 g/dL (calc) — ABNORMAL LOW (ref 1.9–3.7)
Glucose, Bld: 90 mg/dL (ref 65–99)
Potassium: 3.9 mmol/L (ref 3.5–5.3)
Sodium: 132 mmol/L — ABNORMAL LOW (ref 135–146)
Total Bilirubin: 1.2 mg/dL (ref 0.2–1.2)
Total Protein: 6.3 g/dL (ref 6.1–8.1)

## 2021-05-14 LAB — CBC WITH DIFFERENTIAL/PLATELET
Absolute Monocytes: 1365 cells/uL — ABNORMAL HIGH (ref 200–950)
Basophils Absolute: 80 cells/uL (ref 0–200)
Basophils Relative: 0.1 %
Eosinophils Absolute: 241 cells/uL (ref 15–500)
Eosinophils Relative: 0.3 %
HCT: 32.9 % — ABNORMAL LOW (ref 35.0–45.0)
Hemoglobin: 10.8 g/dL — ABNORMAL LOW (ref 11.7–15.5)
Lymphs Abs: 73234 cells/uL — ABNORMAL HIGH (ref 850–3900)
MCH: 33.3 pg — ABNORMAL HIGH (ref 27.0–33.0)
MCHC: 32.8 g/dL (ref 32.0–36.0)
MCV: 101.5 fL — ABNORMAL HIGH (ref 80.0–100.0)
MPV: 9.2 fL (ref 7.5–12.5)
Monocytes Relative: 1.7 %
Neutro Abs: 5380 cells/uL (ref 1500–7800)
Neutrophils Relative %: 6.7 %
Platelets: 67 10*3/uL — ABNORMAL LOW (ref 140–400)
RBC: 3.24 10*6/uL — ABNORMAL LOW (ref 3.80–5.10)
RDW: 14 % (ref 11.0–15.0)
Total Lymphocyte: 91.2 %
WBC: 80.3 10*3/uL — ABNORMAL HIGH (ref 3.8–10.8)

## 2021-05-14 LAB — URINALYSIS, ROUTINE W REFLEX MICROSCOPIC
Bacteria, UA: NONE SEEN /HPF
Bilirubin Urine: NEGATIVE
Glucose, UA: NEGATIVE
Hgb urine dipstick: NEGATIVE
Hyaline Cast: NONE SEEN /LPF
Ketones, ur: NEGATIVE
Nitrite: NEGATIVE
Protein, ur: NEGATIVE
RBC / HPF: NONE SEEN /HPF (ref 0–2)
Specific Gravity, Urine: 1.01 (ref 1.001–1.035)
Squamous Epithelial / HPF: NONE SEEN /HPF (ref <=5)
pH: 5.5 (ref 5.0–8.0)

## 2021-05-14 LAB — INSULIN, RANDOM: Insulin: 11.5 u[IU]/mL

## 2021-05-14 LAB — LIPID PANEL
Cholesterol: 191 mg/dL (ref <200)
HDL: 54 mg/dL (ref >=50)
LDL Cholesterol (Calc): 110 mg/dL (calc) — ABNORMAL HIGH
Non-HDL Cholesterol (Calc): 137 mg/dL (calc) — ABNORMAL HIGH (ref <130)
Total CHOL/HDL Ratio: 3.5 (calc) (ref <5.0)
Triglycerides: 157 mg/dL — ABNORMAL HIGH (ref <150)

## 2021-05-14 LAB — TSH: TSH: 3.21 mIU/L (ref 0.40–4.50)

## 2021-05-14 LAB — VITAMIN D 25 HYDROXY (VIT D DEFICIENCY, FRACTURES): Vit D, 25-Hydroxy: 67 ng/mL (ref 30–100)

## 2021-05-14 LAB — MAGNESIUM: Magnesium: 1.6 mg/dL (ref 1.5–2.5)

## 2021-05-14 LAB — HEMOGLOBIN A1C
Hgb A1c MFr Bld: 4.9 % of total Hgb (ref <5.7)
Mean Plasma Glucose: 94 mg/dL
eAG (mmol/L): 5.2 mmol/L

## 2021-05-14 LAB — MICROALBUMIN / CREATININE URINE RATIO
Creatinine, Urine: 89 mg/dL (ref 20–275)
Microalb Creat Ratio: 17 mcg/mg creat (ref <30)
Microalb, Ur: 1.5 mg/dL

## 2021-05-16 NOTE — Progress Notes (Signed)
============================================================ ============================================================  -  Red cell count - Hgb = 10.8 gm% about the same &  - WBC = 80,000 - a little higher and Platelets are 67,000                        (Will send result to Dr Irene Limbo also)  ============================================================ ============================================================   - Kidney functions still look a little dehydrated                                                         ( GFR is down from 68  to now 49)    Very important to drink adequate amounts of fluids to                                                                                prevent permanent damage    - Recommend drink at least 6 bottles (16 ounces) of                                                                     fluids /water /day = 96 Oz ~100 oz   - 100 oz = 3,000 cc or 3 liters / day  - >>                                              That's 1 &1/2 bottles of a 2 liter soda bottle /day !  ============================================================ ============================================================  -  Total Chol = 191   -      OK   - but   - Bad or Dangerous LDL Chol = 110 - too high   - Recommend a much stricter  low cholesterol diet   - Cholesterol only comes from animal sources  - ie. meat, dairy, egg yolks  - Eat all the vegetables you want.  - Avoid meat, especially red meat - Beef AND Pork .  - Avoid cheese & dairy - milk & ice cream.     - Cheese is the most concentrated form of trans-fats which  is the worst thing to clog up our arteries.   - Veggie cheese is OK which can be found in the fresh  produce section at [employer] or [employer] or [employer] ============================================================ ============================================================  -  A1c - Normal - Great - No Diabetes  ! ============================================================ ============================================================  -  Vitamin D = 67 - Excellent  ============================================================ ============================================================  -  All Else - CBC  -  Electrolytes - Liver - Magnesium & Thyroid    - all  Normal / OK ============================================================ ============================================================

## 2021-05-17 NOTE — Progress Notes (Signed)
Pt was called to discuss her lab results. Pt didn't answer the phone. A VM was left for pt to give the office a call back.

## 2021-05-19 ENCOUNTER — Telehealth: Payer: Self-pay

## 2021-05-19 NOTE — Telephone Encounter (Signed)
Pt states she stopped taken her clonidine. and is taken gabapentin. Pt states she had 2 bad dreams where she woke up screaming, sweating,

## 2021-05-26 ENCOUNTER — Other Ambulatory Visit: Payer: Medicare Other

## 2021-05-26 ENCOUNTER — Ambulatory Visit: Payer: Medicare Other | Admitting: Hematology

## 2021-05-26 ENCOUNTER — Other Ambulatory Visit: Payer: Self-pay

## 2021-05-26 MED ORDER — LEVOTHYROXINE SODIUM 50 MCG PO TABS: 50.0000 ug | ORAL_TABLET | Freq: Every day | ORAL | 11 refills | Status: DC

## 2021-06-14 DIAGNOSIS — M7061 Trochanteric bursitis, right hip: Secondary | ICD-10-CM | POA: Diagnosis not present

## 2021-06-14 DIAGNOSIS — M5416 Radiculopathy, lumbar region: Secondary | ICD-10-CM | POA: Diagnosis not present

## 2021-06-18 ENCOUNTER — Other Ambulatory Visit: Payer: Medicare Other

## 2021-06-18 ENCOUNTER — Ambulatory Visit: Payer: Medicare Other | Admitting: Hematology

## 2021-06-22 NOTE — Progress Notes (Signed)
Returned call to pt per her request . Pt states she had been exposed to [place] on Sunday. Pt asked for direction about testing and her next appointment. Discussed testing options and requested pt call back if she is positive so her next appointment can be rescheduled. Pt verbalized understanding.

## 2021-06-24 DIAGNOSIS — Z20822 Contact with and (suspected) exposure to covid-19: Secondary | ICD-10-CM | POA: Diagnosis not present

## 2021-06-25 ENCOUNTER — Telehealth: Payer: Self-pay | Admitting: Hematology

## 2021-06-25 ENCOUNTER — Other Ambulatory Visit: Payer: Self-pay

## 2021-06-25 DIAGNOSIS — C9110 Chronic lymphocytic leukemia of B-cell type not having achieved remission: Secondary | ICD-10-CM

## 2021-06-25 NOTE — Telephone Encounter (Signed)
Scheduled appointment per 07/22 sch msg. Left message.  

## 2021-06-28 ENCOUNTER — Ambulatory Visit: Payer: Medicare Other | Admitting: Hematology

## 2021-06-28 ENCOUNTER — Other Ambulatory Visit: Payer: Medicare Other

## 2021-07-15 DIAGNOSIS — M5416 Radiculopathy, lumbar region: Secondary | ICD-10-CM | POA: Diagnosis not present

## 2021-07-26 ENCOUNTER — Other Ambulatory Visit: Payer: Self-pay

## 2021-07-26 ENCOUNTER — Inpatient Hospital Stay: Payer: Medicare Other | Admitting: Hematology

## 2021-07-26 ENCOUNTER — Inpatient Hospital Stay: Payer: Medicare Other | Attending: Hematology

## 2021-07-26 VITALS — BP 185/72 | HR 66 | Temp 98.0°F | Resp 18 | Wt 199.3 lb

## 2021-07-26 DIAGNOSIS — D801 Nonfamilial hypogammaglobulinemia: Secondary | ICD-10-CM | POA: Insufficient documentation

## 2021-07-26 DIAGNOSIS — Z7289 Other problems related to lifestyle: Secondary | ICD-10-CM | POA: Insufficient documentation

## 2021-07-26 DIAGNOSIS — D696 Thrombocytopenia, unspecified: Secondary | ICD-10-CM | POA: Diagnosis not present

## 2021-07-26 DIAGNOSIS — Z87891 Personal history of nicotine dependence: Secondary | ICD-10-CM | POA: Diagnosis not present

## 2021-07-26 DIAGNOSIS — R161 Splenomegaly, not elsewhere classified: Secondary | ICD-10-CM | POA: Diagnosis not present

## 2021-07-26 DIAGNOSIS — D649 Anemia, unspecified: Secondary | ICD-10-CM | POA: Insufficient documentation

## 2021-07-26 DIAGNOSIS — C9110 Chronic lymphocytic leukemia of B-cell type not having achieved remission: Secondary | ICD-10-CM

## 2021-07-26 DIAGNOSIS — Z79899 Other long term (current) drug therapy: Secondary | ICD-10-CM | POA: Diagnosis not present

## 2021-07-26 LAB — CBC WITH DIFFERENTIAL (CANCER CENTER ONLY)
Abs Immature Granulocytes: 0.2 10*3/uL — ABNORMAL HIGH (ref 0.00–0.07)
Basophils Absolute: 0 10*3/uL (ref 0.0–0.1)
Basophils Relative: 0 %
Eosinophils Absolute: 0.1 10*3/uL (ref 0.0–0.5)
Eosinophils Relative: 0 %
HCT: 31.5 % — ABNORMAL LOW (ref 36.0–46.0)
Hemoglobin: 10.6 g/dL — ABNORMAL LOW (ref 12.0–15.0)
Immature Granulocytes: 0 %
Lymphocytes Relative: 87 %
Lymphs Abs: 50.1 10*3/uL — ABNORMAL HIGH (ref 0.7–4.0)
MCH: 33.4 pg (ref 26.0–34.0)
MCHC: 33.7 g/dL (ref 30.0–36.0)
MCV: 99.4 fL (ref 80.0–100.0)
Monocytes Absolute: 2 10*3/uL — ABNORMAL HIGH (ref 0.1–1.0)
Monocytes Relative: 4 %
Neutro Abs: 5 10*3/uL (ref 1.7–7.7)
Neutrophils Relative %: 9 %
Platelet Count: 66 10*3/uL — ABNORMAL LOW (ref 150–400)
RBC: 3.17 MIL/uL — ABNORMAL LOW (ref 3.87–5.11)
RDW: 12.9 % (ref 11.5–15.5)
WBC Count: 57.5 10*3/uL (ref 4.0–10.5)
nRBC: 0 % (ref 0.0–0.2)

## 2021-07-26 LAB — CMP (CANCER CENTER ONLY)
ALT: 18 U/L (ref 0–44)
AST: 23 U/L (ref 15–41)
Albumin: 4.2 g/dL (ref 3.5–5.0)
Alkaline Phosphatase: 63 U/L (ref 38–126)
Anion gap: 12 (ref 5–15)
BUN: 13 mg/dL (ref 8–23)
CO2: 24 mmol/L (ref 22–32)
Calcium: 9.1 mg/dL (ref 8.9–10.3)
Chloride: 97 mmol/L — ABNORMAL LOW (ref 98–111)
Creatinine: 0.87 mg/dL (ref 0.44–1.00)
GFR, Estimated: >60 mL/min (ref >60)
Glucose, Bld: 99 mg/dL (ref 70–99)
Potassium: 3.8 mmol/L (ref 3.5–5.1)
Sodium: 133 mmol/L — ABNORMAL LOW (ref 135–145)
Total Bilirubin: 1 mg/dL (ref 0.3–1.2)
Total Protein: 6.3 g/dL — ABNORMAL LOW (ref 6.5–8.1)

## 2021-07-26 LAB — LACTATE DEHYDROGENASE: LDH: 180 U/L (ref 98–192)

## 2021-08-01 ENCOUNTER — Encounter: Payer: Self-pay | Admitting: Hematology

## 2021-08-01 NOTE — Progress Notes (Signed)
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 (patient reports -planned for August 2017) 13q deletion does pre-dispose her to recurrent SCC  Current Treatment: observation  Previous treatment: IVIG for several months last winter to reduce recurrent respiratory infections (Patient notes that this helped) Has not required definitive treatment for CLL at this time and has been reluctant to consider treatment when discussed in the setting of thrombocytopenia.  INTERVAL HISTORY:  Rebecca Bond returns today for management and evaluation of [name]. The patient's last visit with [place] was on 01/27/2021. The pt reports that she is doing well overall.  The pt reports that she has no acute new symptoms.  No significant new lumps or bumps fevers chills night sweats or unexpected weight loss. She has had chronic back pain issues which are relatively steady at this time. Mild fatigue. No issues with bleeding.  No overt infection issues. Continue to follow with dermatology for skin screening. Labs today reviewed with the patient  REVIEW OF SYSTEMS:   .10 Point review of Systems was done is negative except as noted above.   Past Medical History:  Diagnosis Date   Anemia    Anxiety    Arthritis    CLL (chronic lymphocytic leukemia) ([place]) dx'd ~ 2015   Depression    GERD (gastroesophageal reflux disease)    Headache    "sinus headaches"   History of kidney stones     Hypertension    OSA (obstructive sleep apnea)    Osteoarthritis    Pneumonia    Prediabetes    Recurrent sinus infections 08/02/2012   Tubular adenoma of colon     . Past Surgical History:  Procedure Laterality Date   ABDOMINAL HYSTERECTOMY  1980's   "endometrosis"   APPENDECTOMY     BREAST SURGERY     CATARACT EXTRACTION, BILATERAL     CHOLECYSTECTOMY  1985   FUNCTIONAL ENDOSCOPIC SINUS SURGERY  1990's   "cause I kept having sinus infections"   immunoglobulin treatment  2017   LAPAROSCOPIC APPENDECTOMY N/A 06/04/2014   Procedure: APPENDECTOMY LAPAROSCOPIC;  Surgeon: Zenovia Jarred, MD;  Location: [facility];  Service: General;  Laterality: N/A;   LUMBAR LAMINECTOMY/DECOMPRESSION MICRODISCECTOMY Left 11/01/2017   Procedure: Left Lumbar Four-Five Extraforaminal Microdiscectomy;  Surgeon: Eustace Moore, MD;  Location: [facility];  Service: Neurosurgery;  Laterality: Left;   LYMPH NODE BIOPSY     "determined I had CLL"   SHOULDER ARTHROSCOPY Right    TEMPOROMANDIBULAR JOINT ARTHROPLASTY  1980's   TONSILLECTOMY AND ADENOIDECTOMY  1950's    . Social History   Tobacco Use   Smoking status: Former    Packs/day: 0.75    Years: 4.00    Pack years: 3.00    Types: Cigarettes    Quit date: 12/05/1968    Years since quitting: 52.6   Smokeless tobacco: Never  Vaping Use   Vaping Use: Never used  Substance Use Topics   Alcohol use: Yes    Alcohol/week: 7.0 standard  drinks    Types: 7 Glasses of wine per week   Drug use: No    ALLERGIES:  is allergic to hyzaar [losartan potassium-hctz], sudafed [pseudoephedrine hcl], levaquin [levofloxacin in d5w], acyclovir and related, biaxin [clarithromycin], cantaloupe extract allergy skin test, celexa [citalopram], flexeril [cyclobenzaprine], fosamax [alendronate sodium], iohexol, losartan, meloxicam, norvasc [amlodipine], other, pseudoephedrine, and zoloft [sertraline hcl].  MEDICATIONS:  Current Outpatient Medications  Medication Sig Dispense  Refill   ALPRAZolam (XANAX) 1 MG tablet TAKE 1/2 - 1 TABLET 2 - 3 X /DAY ONLY IF NEEDED FOR PANIC ATTACK/ANXIETY ATTACK & LIMIT TO 5 DAYS /WEEK TO AVOID ADDICTION & DEMENTIA 90 tablet 0   Ascorbic Acid (VITAMIN C) 1000 MG tablet Take 500 mg by mouth daily.     bisoprolol-hydrochlorothiazide (ZIAC) 10-6.25 MG tablet Take  1 tablet  Daily for BP 90 tablet 3   buPROPion (WELLBUTRIN XL) 150 MG 24 hr tablet TAKE 1 TABLET BY MOUTH EVERY MORNING FOR MOOD OR DEPRESSION OR FOCUS OR CONCENTRATION 90 tablet 1   Cholecalciferol (VITAMIN D3) 250 MCG (10000 UT) TABS Take 5,000 Units by mouth in the morning.     ezetimibe (ZETIA) 10 MG tablet Take  1 tablet  Daily  for Cholesterol 90 tablet 1   FLUoxetine (PROZAC) 20 MG capsule TAKE 1 CAPSULE BY MOUTH DAILY FOR MOOD 90 capsule 3   levothyroxine (SYNTHROID) 50 MCG tablet Take 1 tablet (50 mcg total) by mouth daily. 30 tablet 11   Probiotic Product (PROBIOTIC PO) Take 1 capsule by mouth in the morning.      RESTASIS 0.05 % ophthalmic emulsion Place 1 drop into both eyes 2 (two) times daily as needed (for seasonal allergies/irritation).      No current facility-administered medications for this visit.    PHYSICAL EXAMINATION: ECOG PERFORMANCE STATUS: 1 - Symptomatic but completely ambulatory  Vitals:   07/26/21 1450  BP: (!) 185/72  Pulse: 66  Resp: 18  Temp: 98 F (36.7 C)  SpO2: 99%    Filed Weights   07/26/21 1450  Weight: 199 lb 4.8 oz (90.4 kg)   .Body mass index is 36.45 kg/m.  NAD . GENERAL:alert, in no acute distress and comfortable SKIN: no acute rashes, no significant lesions EYES: conjunctiva are pink and non-injected, sclera anicteric OROPHARYNX: MMM, no exudates, no oropharyngeal erythema or ulceration NECK: supple, no JVD LYMPH:  no palpable lymphadenopathy in the cervical, axillary or inguinal regions LUNGS: clear to auscultation b/l with normal respiratory effort HEART: regular rate & rhythm ABDOMEN:  normoactive bowel  sounds , non tender, not distended. Extremity: no pedal edema PSYCH: alert & oriented x 3 with fluent speech NEURO: no focal motor/sensory deficits   LABORATORY DATA:   CBC Latest Ref Rng & Units 07/26/2021 05/13/2021 02/10/2021  WBC 4.0 - 10.5 K/uL 57.5(HH) 80.3(H) 73.9(H)  Hemoglobin 12.0 - 15.0 g/dL 10.6(L) 10.8(L) 11.8  Hematocrit 36.0 - 46.0 % 31.5(L) 32.9(L) 34.7(L)  Platelets 150 - 400 K/uL 66(L) 67(L) 87(L)   CBC    Component Value Date/Time   WBC 57.5 (HH) 07/26/2021 1426   WBC 80.3 (H) 05/13/2021 1340   RBC 3.17 (L) 07/26/2021 1426   HGB 10.6 (L) 07/26/2021 1426   HGB 11.2 (L) 11/08/2017 1418   HCT 31.5 (L) 07/26/2021 1426   HCT 34.8 11/08/2017 1418   PLT 66 (L) 07/26/2021 1426   PLT 140 (L) 11/08/2017 1418   MCV 99.4 07/26/2021 1426   MCV 96.4 11/08/2017 1418   MCH 33.4 07/26/2021 1426  MCHC 33.7 07/26/2021 1426   RDW 12.9 07/26/2021 1426   RDW 14.8 (H) 11/08/2017 1418   LYMPHSABS 50.1 (H) 07/26/2021 1426   LYMPHSABS 87.0 (H) 11/08/2017 1418   MONOABS 2.0 (H) 07/26/2021 1426   MONOABS 1.5 (H) 11/08/2017 1418   EOSABS 0.1 07/26/2021 1426   EOSABS 0.6 (H) 11/08/2017 1418   BASOSABS 0.0 07/26/2021 1426   BASOSABS 0.2 (H) 11/08/2017 1418    CMP Latest Ref Rng & Units 07/26/2021 05/13/2021 02/10/2021  Glucose 70 - 99 mg/dL 99 90 105(H)  BUN 8 - 23 mg/dL '13 17 18  '$ Creatinine 0.44 - 1.00 mg/dL 0.87 1.11(H) 0.84  Sodium 135 - 145 mmol/L 133(L) 132(L) 133(L)  Potassium 3.5 - 5.1 mmol/L 3.8 3.9 4.1  Chloride 98 - 111 mmol/L 97(L) 95(L) 96(L)  CO2 22 - 32 mmol/L '24 25 23  '$ Calcium 8.9 - 10.3 mg/dL 9.1 9.1 9.5  Total Protein 6.5 - 8.1 g/dL 6.3(L) 6.3 6.5  Total Bilirubin 0.3 - 1.2 mg/dL 1.0 1.2 0.9  Alkaline Phos 38 - 126 U/L 63 - -  AST 15 - 41 U/L 23 37(H) 27  ALT 0 - 44 U/L 18 30(H) 19      RADIOGRAPHIC STUDIES: I have personally reviewed the radiological images as listed and agreed with the findings in the report.  CT ABDOMEN AND PELVIS WITHOUT CONTRAST     IMPRESSION: 1. No acute abnormalities within the abdomen or pelvis. 2. Mild splenomegaly and several prominent to mildly enlarged pelvic and inguinal lymph nodes. These findings are similar to the prior CT. 3. Small nonobstructing stone in the lower pole the right kidney. No ureteral stones or obstructive uropathy.     Electronically Signed   By: Lajean Manes M.D.   On: 11/14/2016 16:39     ASSESSMENT & PLAN:   75 y.o.  caucasian female with   1. Rai Stage 2 previously - now Stage IV CLL with lymphocytosis, LNadenopathy and mild splenomegaly with anemia and thrombocytopenia. -patient has previously and continues to desire holding off CLL directed treatment as long as possible.   Lab Results  Component Value Date   LDH 180 07/26/2021   PLAN: -Discussed pt labwork today, 07/26/2021; WBC stable. Plt stable in the 60k range. Other counts and chemistries stable. LDH normal. Mild anemia hemoglobin of 10.6. -Again discussed with the patient's thrombocytopenia does make her a candidate for considering initiating treatment but the patient would want to continue monitoring at this time. -No lab or clinical evidence of significant new CLL progression at this time. -Will see back in 4 months with labs.  2.  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 Has been taking good infection prevention precautions. CLL initially diagnosed 2009 with 13q deletion. Only intervention thus far has been intermittent IVIG, clinically very helpful with decrease in respiratory infections.  Last imaging was CT AP 02-2015. --showed no evidence of Splenomegaly. Negative Hep B serology 2015  No overt new constitutional symptoms except mild unchanged fatigue. Has had significant recurrent headaches with IVIG - discontinued  3. Mild Anemia Hgb hemoglobin stable in the 10.5-11 range.  4. Moderate thrombocytopenia PLT count at [facility] and has recently been in the 80-90k range.  5. H/o  Recurrent cutaneous SCC s/p Mohs  surgery for SCC of the nosehealed. No issues with infection-   her 13q deletion places her at risk for recurrent SCC. -continue close f/u with dermatologist for evaluation and management of non melanoma skin cancers that can be increased in patient with CLL  with 13q deletion.   FOLLOW UP: RTC with Dr Irene Limbo with labs in 4 months  . The total time spent in the appointment was 20 minutes and more than 50% was on counseling and direct patient cares.   All of the patient's questions were answered with apparent satisfaction. The patient knows to call the clinic with any problems, questions or concerns.   Sullivan Lone MD [place] AAHIVMS [facility] Hematology/Oncology Physician [facility]  (Office):       [phone] (Work cell):  [phone] (Fax):           [phone]

## 2021-08-02 DIAGNOSIS — M5416 Radiculopathy, lumbar region: Secondary | ICD-10-CM | POA: Diagnosis not present

## 2021-08-13 ENCOUNTER — Other Ambulatory Visit: Payer: Self-pay | Admitting: Internal Medicine

## 2021-08-13 DIAGNOSIS — E782 Mixed hyperlipidemia: Secondary | ICD-10-CM

## 2021-08-26 ENCOUNTER — Ambulatory Visit: Payer: Medicare Other | Admitting: Nurse Practitioner

## 2021-08-31 ENCOUNTER — Ambulatory Visit: Payer: Medicare Other | Admitting: Nurse Practitioner

## 2021-09-08 ENCOUNTER — Inpatient Hospital Stay (HOSPITAL_BASED_OUTPATIENT_CLINIC_OR_DEPARTMENT_OTHER)
Admission: EM | Admit: 2021-09-08 | Discharge: 2021-09-12 | DRG: 177 | Disposition: A | Discharge status: Home / Self Care | Payer: Medicare Other | Attending: Internal Medicine | Admitting: Internal Medicine

## 2021-09-08 ENCOUNTER — Other Ambulatory Visit: Payer: Self-pay

## 2021-09-08 ENCOUNTER — Ambulatory Visit (INDEPENDENT_AMBULATORY_CARE_PROVIDER_SITE_OTHER): Payer: Medicare Other | Admitting: Internal Medicine

## 2021-09-08 ENCOUNTER — Emergency Department (HOSPITAL_BASED_OUTPATIENT_CLINIC_OR_DEPARTMENT_OTHER): Payer: Medicare Other

## 2021-09-08 ENCOUNTER — Encounter (HOSPITAL_COMMUNITY): Payer: Self-pay | Admitting: Internal Medicine

## 2021-09-08 VITALS — HR 82 | Temp 97.9°F

## 2021-09-08 DIAGNOSIS — E782 Mixed hyperlipidemia: Secondary | ICD-10-CM | POA: Diagnosis not present

## 2021-09-08 DIAGNOSIS — Z8261 Family history of arthritis: Secondary | ICD-10-CM

## 2021-09-08 DIAGNOSIS — F419 Anxiety disorder, unspecified: Secondary | ICD-10-CM | POA: Diagnosis present

## 2021-09-08 DIAGNOSIS — K219 Gastro-esophageal reflux disease without esophagitis: Secondary | ICD-10-CM | POA: Diagnosis present

## 2021-09-08 DIAGNOSIS — Z6837 Body mass index (BMI) 37.0-37.9, adult: Secondary | ICD-10-CM

## 2021-09-08 DIAGNOSIS — U071 COVID-19: Principal | ICD-10-CM | POA: Diagnosis present

## 2021-09-08 DIAGNOSIS — Z881 Allergy status to other antibiotic agents status: Secondary | ICD-10-CM

## 2021-09-08 DIAGNOSIS — R7303 Prediabetes: Secondary | ICD-10-CM | POA: Diagnosis not present

## 2021-09-08 DIAGNOSIS — Z79890 Hormone replacement therapy (postmenopausal): Secondary | ICD-10-CM

## 2021-09-08 DIAGNOSIS — Z90710 Acquired absence of both cervix and uterus: Secondary | ICD-10-CM

## 2021-09-08 DIAGNOSIS — E871 Hypo-osmolality and hyponatremia: Secondary | ICD-10-CM | POA: Diagnosis not present

## 2021-09-08 DIAGNOSIS — Z79899 Other long term (current) drug therapy: Secondary | ICD-10-CM

## 2021-09-08 DIAGNOSIS — Z91018 Allergy to other foods: Secondary | ICD-10-CM

## 2021-09-08 DIAGNOSIS — J9601 Acute respiratory failure with hypoxia: Secondary | ICD-10-CM | POA: Diagnosis not present

## 2021-09-08 DIAGNOSIS — M1990 Unspecified osteoarthritis, unspecified site: Secondary | ICD-10-CM | POA: Diagnosis present

## 2021-09-08 DIAGNOSIS — Z888 Allergy status to other drugs, medicaments and biological substances status: Secondary | ICD-10-CM

## 2021-09-08 DIAGNOSIS — Z87442 Personal history of urinary calculi: Secondary | ICD-10-CM

## 2021-09-08 DIAGNOSIS — R519 Headache, unspecified: Secondary | ICD-10-CM | POA: Diagnosis not present

## 2021-09-08 DIAGNOSIS — G4733 Obstructive sleep apnea (adult) (pediatric): Secondary | ICD-10-CM | POA: Diagnosis not present

## 2021-09-08 DIAGNOSIS — D849 Immunodeficiency, unspecified: Secondary | ICD-10-CM | POA: Diagnosis not present

## 2021-09-08 DIAGNOSIS — I10 Essential (primary) hypertension: Secondary | ICD-10-CM | POA: Diagnosis present

## 2021-09-08 DIAGNOSIS — F418 Other specified anxiety disorders: Secondary | ICD-10-CM | POA: Diagnosis not present

## 2021-09-08 DIAGNOSIS — I517 Cardiomegaly: Secondary | ICD-10-CM | POA: Diagnosis not present

## 2021-09-08 DIAGNOSIS — G4483 Primary cough headache: Secondary | ICD-10-CM

## 2021-09-08 DIAGNOSIS — C9110 Chronic lymphocytic leukemia of B-cell type not having achieved remission: Secondary | ICD-10-CM | POA: Diagnosis present

## 2021-09-08 DIAGNOSIS — Z87891 Personal history of nicotine dependence: Secondary | ICD-10-CM | POA: Diagnosis not present

## 2021-09-08 DIAGNOSIS — E876 Hypokalemia: Secondary | ICD-10-CM | POA: Diagnosis not present

## 2021-09-08 DIAGNOSIS — Z8249 Family history of ischemic heart disease and other diseases of the circulatory system: Secondary | ICD-10-CM

## 2021-09-08 DIAGNOSIS — F325 Major depressive disorder, single episode, in full remission: Secondary | ICD-10-CM | POA: Diagnosis not present

## 2021-09-08 DIAGNOSIS — R059 Cough, unspecified: Secondary | ICD-10-CM | POA: Diagnosis not present

## 2021-09-08 DIAGNOSIS — J1282 Pneumonia due to coronavirus disease 2019: Secondary | ICD-10-CM | POA: Diagnosis present

## 2021-09-08 DIAGNOSIS — E6609 Other obesity due to excess calories: Secondary | ICD-10-CM

## 2021-09-08 HISTORY — DX: Pneumonia due to coronavirus disease 2019: J12.82

## 2021-09-08 LAB — CBC WITH DIFFERENTIAL/PLATELET
Abs Immature Granulocytes: 0 10*3/uL (ref 0.00–0.07)
Basophils Absolute: 0 10*3/uL (ref 0.0–0.1)
Basophils Relative: 0 %
Eosinophils Absolute: 0.8 10*3/uL — ABNORMAL HIGH (ref 0.0–0.5)
Eosinophils Relative: 1 %
HCT: 33.9 % — ABNORMAL LOW (ref 36.0–46.0)
Hemoglobin: 11.1 g/dL — ABNORMAL LOW (ref 12.0–15.0)
Lymphocytes Relative: 83 %
Lymphs Abs: 67.8 10*3/uL — ABNORMAL HIGH (ref 0.7–4.0)
MCH: 32.5 pg (ref 26.0–34.0)
MCHC: 32.7 g/dL (ref 30.0–36.0)
MCV: 99.1 fL (ref 80.0–100.0)
Monocytes Absolute: 0 10*3/uL — ABNORMAL LOW (ref 0.1–1.0)
Monocytes Relative: 0 %
Neutro Abs: 13.1 10*3/uL — ABNORMAL HIGH (ref 1.7–7.7)
Neutrophils Relative %: 16 %
Platelets: 89 10*3/uL — ABNORMAL LOW (ref 150–400)
RBC: 3.42 MIL/uL — ABNORMAL LOW (ref 3.87–5.11)
RDW: 13.7 % (ref 11.5–15.5)
Smear Review: DECREASED
WBC: 81.7 10*3/uL (ref 4.0–10.5)
nRBC: 0 % (ref 0.0–0.2)

## 2021-09-08 LAB — BASIC METABOLIC PANEL
Anion gap: 13 (ref 5–15)
BUN: 16 mg/dL (ref 8–23)
CO2: 24 mmol/L (ref 22–32)
Calcium: 8.9 mg/dL (ref 8.9–10.3)
Chloride: 93 mmol/L — ABNORMAL LOW (ref 98–111)
Creatinine, Ser: 0.94 mg/dL (ref 0.44–1.00)
GFR, Estimated: >60 mL/min (ref >60)
Glucose, Bld: 93 mg/dL (ref 70–99)
Potassium: 3.9 mmol/L (ref 3.5–5.1)
Sodium: 130 mmol/L — ABNORMAL LOW (ref 135–145)

## 2021-09-08 LAB — POC COVID19 BINAXNOW: SARS Coronavirus 2 Ag: POSITIVE — AB

## 2021-09-08 MED ORDER — DEXAMETHASONE SODIUM PHOSPHATE 10 MG/ML IJ SOLN
10.0000 mg | Freq: Once | INTRAMUSCULAR | Status: AC
  Administered 2021-09-08: 10 mg via INTRAVENOUS
  Filled 2021-09-08: qty 1

## 2021-09-08 MED ORDER — SODIUM CHLORIDE 0.9 % IV SOLN
100.0000 mg | INTRAVENOUS | Status: AC
  Administered 2021-09-08: 100 mg via INTRAVENOUS

## 2021-09-08 MED ORDER — SODIUM CHLORIDE 0.9 % IV SOLN
100.0000 mg | Freq: Every day | INTRAVENOUS | Status: AC
  Administered 2021-09-08 – 2021-09-11 (×4): 100 mg via INTRAVENOUS
  Filled 2021-09-08 (×3): qty 20

## 2021-09-08 NOTE — ED Notes (Signed)
Patient ambulated to bed and oxygen saturation noted to be 84% on RA. After allowing her to settle, oxygen saturation still noted to be only 87%. RT in room at time

## 2021-09-08 NOTE — Progress Notes (Signed)
New admission arrived to unit. VSS, 2L [place]. Pt aox4, ambulating independently in the room. Denies pain. Admitting MD paged & made aware of pt arrival.

## 2021-09-08 NOTE — ED Triage Notes (Signed)
Patient reports she has been feeling ill since Monday and today had a positive COVID. Patient reports she has lung cancer.

## 2021-09-08 NOTE — ED Notes (Signed)
Patient arrived to the ED for cc of shob. Patient + COVID today w/ symptoms starting on Monday per patient. Patient shob when speaking and on exertion. Patient repositioned self in bed and O2 dropped to 84-85% on RA. Patient was placed on 2 L [place] and O2 94-94% after [place] application and at rest in bed. Patient does not wear O2 at home. RT assessment in Epic. RN at bedside during RT assessment. RT will continue to monitor patient respiratory needs.

## 2021-09-08 NOTE — ED Notes (Signed)
Called [facility] to transport patient to 5 E- room [phone]

## 2021-09-08 NOTE — ED Provider Notes (Signed)
[facility] EMERGENCY DEPT Provider Note   CSN: [id number] Arrival date & time: 09/08/21  1307     History Chief Complaint  Patient presents with   Shortness of Breath    Rebecca Bond is a 75 y.o. female.  HPI  75 year old female with medical history significant for CLL who presents to the emergency department with new acute hypoxic respiratory failure after being diagnosed with COVID-19.  The patient states that she has had symptoms for the past few days.  She tested positive via COVID-19 antigen today.  She denies any sick contacts.  She denies any chest pain.  She endorses mild shortness of breath.  She denies any fevers or chills.  She endorses a nonproductive cough.  Symptoms have been present since Monday.  Cough is aggravated by attempts at deep breathing.  No alleviating factors.  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   Depression    GERD (gastroesophageal reflux disease)    Headache    "sinus headaches"   History of kidney stones    Hypertension    OSA (obstructive sleep apnea)    Osteoarthritis    Pneumonia    Prediabetes    Recurrent sinus infections 08/02/2012   Tubular adenoma of colon     Patient Active Problem List   Diagnosis Date Noted   Pneumonia due to COVID-19 virus 09/08/2021   Norovirus 04/10/2020   Acute gastroenteritis 04/08/2020   AKI (acute kidney injury) ([place]) 04/08/2020   Anxiety with depression 04/08/2020   Hyperbilirubinemia 04/08/2020   Hypocalcemia 04/08/2020   Hypomagnesemia 04/08/2020   Chronic bronchitis, unspecified chronic bronchitis type ([place]) 03/12/2020   Atherosclerosis of aorta ([place]) 03/12/2020   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   Counseling regarding advance care planning and goals of care 12/16/2018   S/P lumbar laminectomy 11/01/2017   Thrombocytopenia ([place]) 05/02/2016   Immunocompromised ([place]) 10/10/2015    BMI 37.85,   adult 09/23/2015   Environmental and seasonal allergies 08/15/2015   Mixed hyperlipidemia 06/01/2015   Medication management 06/01/2015   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   Vitamin D deficiency 09/30/2014   HTN (hypertension) 01/08/2014   Abnormal glucose    Depression, major, in remission ([place])    GERD (gastroesophageal reflux disease)    OSA (obstructive sleep apnea)    Angioedema secondary to ACE/ARB 12/25/2013   Class 2 obesity due to excess calories with body mass index (BMI) of 37.0 to 37.9 in adult 12/17/2013   Hepatitis A 08/02/2012   Recurrent sinus infections 08/02/2012   CLL (chronic lymphocytic leukemia) ([place]) 11/22/2011    Past Surgical History:  Procedure Laterality Date   ABDOMINAL HYSTERECTOMY  1980's   "endometrosis"   APPENDECTOMY     BREAST SURGERY     CATARACT EXTRACTION, BILATERAL     CHOLECYSTECTOMY  1985   FUNCTIONAL ENDOSCOPIC SINUS SURGERY  1990's   "cause I kept having sinus infections"   immunoglobulin treatment  2017   LAPAROSCOPIC APPENDECTOMY N/A 06/04/2014   Procedure: APPENDECTOMY LAPAROSCOPIC;  Surgeon: Zenovia Jarred, MD;  Location: [facility];  Service: General;  Laterality: N/A;   LUMBAR LAMINECTOMY/DECOMPRESSION MICRODISCECTOMY Left 11/01/2017   Procedure: Left Lumbar Four-Five Extraforaminal Microdiscectomy;  Surgeon: Eustace Moore, MD;  Location: [facility];  Service: Neurosurgery;  Laterality: Left;   LYMPH NODE BIOPSY     "determined I had CLL"   SHOULDER ARTHROSCOPY Right  TEMPOROMANDIBULAR JOINT ARTHROPLASTY  1980's   TONSILLECTOMY AND ADENOIDECTOMY  1950's     OB History   No obstetric history on file.     Family History  Problem Relation Age of Onset   Parkinson's disease Mother    Hypertension Mother    Hypertension Maternal Grandmother    Heart attack Maternal Grandmother        Mild   Heart attack Brother    Arthritis Brother     Social History   Tobacco Use   Smoking status: Former     Packs/day: 0.75    Years: 4.00    Pack years: 3.00    Types: Cigarettes    Quit date: 12/05/1968    Years since quitting: 52.7   Smokeless tobacco: Never  Vaping Use   Vaping Use: Never used  Substance Use Topics   Alcohol use: Yes    Alcohol/week: 7.0 standard drinks    Types: 7 Glasses of wine per week   Drug use: No    Home Medications Prior to Admission medications   Medication Sig Start Date End Date Taking? Authorizing Provider  ALPRAZolam (XANAX) 1 MG tablet TAKE 1/2 - 1 TABLET 2 - 3 X /DAY ONLY IF NEEDED FOR PANIC ATTACK/ANXIETY ATTACK & LIMIT TO 5 DAYS /WEEK TO AVOID ADDICTION & DEMENTIA 05/10/21   [name], MD  Ascorbic Acid (VITAMIN C) 1000 MG tablet Take 500 mg by mouth daily.    [provider]  bisoprolol-hydrochlorothiazide [facility]) 10-6.25 MG tablet Take  1 tablet  Daily for BP 04/13/21   [name], MD  buPROPion (WELLBUTRIN XL) 150 MG 24 hr tablet TAKE 1 TABLET BY MOUTH EVERY MORNING FOR MOOD OR DEPRESSION OR FOCUS OR CONCENTRATION 05/12/21   Liane Comber, NP  Cholecalciferol (VITAMIN D3) 250 MCG (10000 UT) TABS Take 5,000 Units by mouth in the morning.    [provider]  DULoxetine (CYMBALTA) 30 MG capsule Take 30 mg by mouth 2 (two) times daily. 08/25/21   [provider]  ezetimibe (ZETIA) 10 MG tablet TAKE ONE TABLET BY MOUTH DAILY FOR CHOLESTEROL 08/13/21   Liane Comber, NP  FLUoxetine (PROZAC) 20 MG capsule TAKE 1 CAPSULE BY MOUTH DAILY FOR MOOD 04/16/21   Liane Comber, NP  gabapentin (NEURONTIN) 300 MG capsule Take 300 mg by mouth at bedtime. 08/25/21   [provider]  levothyroxine (SYNTHROID) 50 MCG tablet Take 1 tablet (50 mcg total) by mouth daily. 05/26/21 05/26/22  Liane Comber, NP  Probiotic Product (PROBIOTIC PO) Take 1 capsule by mouth in the morning.     [provider]  RESTASIS 0.05 % ophthalmic emulsion Place 1 drop into both eyes 2 (two) times daily as needed (for seasonal  allergies/irritation).  08/19/19   [provider]  tiZANidine (ZANAFLEX) 4 MG tablet Take 4 mg by mouth 2 (two) times daily. 08/21/21   [provider]    Allergies    Hyzaar [losartan potassium-hctz], Sudafed [pseudoephedrine hcl], Levaquin [levofloxacin in d5w], Acyclovir and related, Biaxin [clarithromycin], Cantaloupe extract allergy skin test, Celexa [citalopram], Flexeril [cyclobenzaprine], Fosamax [alendronate sodium], Gabapentin, Iohexol, Losartan, Meloxicam, Norvasc [amlodipine], Other, Pseudoephedrine, and Zoloft [sertraline hcl]  Review of Systems   Review of Systems  Constitutional:  Negative for chills and fever.  HENT:  Negative for ear pain and sore throat.   Eyes:  Negative for pain and visual disturbance.  Respiratory:  Positive for cough and shortness of breath.   Cardiovascular:  Negative for chest pain and palpitations.  Gastrointestinal:  Negative for abdominal pain and vomiting.  Genitourinary:  Negative for dysuria and hematuria.  Musculoskeletal:  Negative for arthralgias and back pain.  Skin:  Negative for color change and rash.  Neurological:  Negative for seizures and syncope.  All other systems reviewed and are negative.  Physical Exam Updated Vital Signs BP 106/68 (BP Location: Left Arm)   Pulse 71   Temp 97.9 F (36.6 C) (Oral)   Resp 19   Ht 5' 1.5" (1.562 m)   Wt 90 kg   SpO2 99%   BMI 36.88 kg/m   Physical Exam Vitals and nursing note reviewed.  Constitutional:      General: She is not in acute distress.    Appearance: She is well-developed.     Comments: Elderly female, resting comfortably on 2 L O2 via nasal cannula  HENT:     Head: Normocephalic and atraumatic.  Eyes:     Conjunctiva/sclera: Conjunctivae normal.  Cardiovascular:     Rate and Rhythm: Normal rate and regular rhythm.     Heart sounds: No murmur heard. Pulmonary:     Effort: Pulmonary effort is normal. No respiratory distress.     Breath sounds: Normal  breath sounds.  Abdominal:     Palpations: Abdomen is soft.     Tenderness: There is no abdominal tenderness.  Musculoskeletal:     Cervical back: Neck supple.     Right lower leg: No edema.     Left lower leg: No edema.  Skin:    General: Skin is warm and dry.  Neurological:     General: No focal deficit present.     Mental Status: She is alert and oriented to person, place, and time.    ED Results / Procedures / Treatments   Labs (all labs ordered are listed, but only abnormal results are displayed) Labs Reviewed  CBC WITH DIFFERENTIAL/PLATELET - Abnormal; Notable for the following components:      Result Value   WBC 81.7 (*)    RBC 3.42 (*)    Hemoglobin 11.1 (*)    HCT 33.9 (*)    Platelets 89 (*)    Neutro Abs 13.1 (*)    Lymphs Abs 67.8 (*)    Monocytes Absolute 0.0 (*)    Eosinophils Absolute 0.8 (*)    All other components within normal limits  BASIC METABOLIC PANEL - Abnormal; Notable for the following components:   Sodium 130 (*)    Chloride 93 (*)    All other components within normal limits  CBC - Abnormal; Notable for the following components:   WBC 81.6 (*)    RBC 3.03 (*)    Hemoglobin 10.2 (*)    HCT 30.6 (*)    MCV 101.0 (*)    Platelets 79 (*)    All other components within normal limits  CREATININE, SERUM  PATHOLOGIST SMEAR REVIEW  COMPREHENSIVE METABOLIC PANEL    EKG EKG Interpretation  Date/Time:  Wednesday September 08 2021 13:53:06 EDT Ventricular Rate:  74 PR Interval:  127 QRS Duration: 96 QT Interval:  401 QTC Calculation: 445 R Axis:   40 Text Interpretation: Sinus rhythm Confirmed by Regan Lemming ([id number]) on 09/08/2021 1:59:16 PM  Radiology DG Chest Portable 1 View  Result Date: 09/08/2021 CLINICAL DATA:  A 75 year old female presents with cough and headache and history of COVID infection. EXAM: PORTABLE CHEST 1 VIEW COMPARISON:  Apr 17, 2018. FINDINGS: Trachea midline. Cardiomediastinal contours are stable with signs of cardiac  enlargement. EKG leads project over the chest. Patchy basilar and mid chest opacities are noted. No visible pneumothorax or lobar consolidative process. No sign of pleural effusion. On limited assessment there is no acute skeletal process. IMPRESSION: Patchy basilar and mid chest opacities could be seen in the setting of viral or atypical pneumonia. Compatible with provided history of COVID infection. Cardiomegaly as before. Electronically Signed   By: Zetta Bills M.D.   On: 09/08/2021 14:11    Procedures Procedures   Medications Ordered in ED Medications  remdesivir 100 mg in sodium chloride 0.9 % 100 mL IVPB (100 mg Intravenous New Bag/Given 09/08/21 1550)  remdesivir 100 mg in sodium chloride 0.9 % 100 mL IVPB (100 mg Intravenous New Bag/Given 09/08/21 1636)  enoxaparin (LOVENOX) injection 40 mg (40 mg Subcutaneous Given 09/09/21 0635)  Ipratropium-Albuterol (COMBIVENT) respimat 1 puff (1 puff Inhalation Given 09/09/21 0314)  dexamethasone (DECADRON) injection 6 mg (6 mg Intravenous Given 09/09/21 1015)  guaiFENesin-dextromethorphan (ROBITUSSIN DM) 100-10 MG/5ML syrup 10 mL (has no administration in time range)  chlorpheniramine-HYDROcodone (TUSSIONEX) 10-8 MG/5ML suspension 5 mL (has no administration in time range)  ascorbic acid (VITAMIN C) tablet 500 mg (500 mg Oral Given 09/09/21 1015)  zinc sulfate capsule 220 mg (220 mg Oral Given 09/09/21 1016)  acetaminophen (TYLENOL) tablet 650 mg (has no administration in time range)  ondansetron (ZOFRAN) tablet 4 mg (has no administration in time range)    Or  ondansetron (ZOFRAN) injection 4 mg (has no administration in time range)  doxycycline (VIBRAMYCIN) 100 mg in sodium chloride 0.9 % 250 mL IVPB (100 mg Intravenous New Bag/Given 09/09/21 0114)  melatonin tablet 5 mg (5 mg Oral Given 09/09/21 0114)  ALPRAZolam (XANAX) tablet 0.25 mg (0.25 mg Oral Given 09/09/21 1008)  buPROPion (WELLBUTRIN XL) 24 hr tablet 150 mg (has no administration in time  range)  Vitamin D3 TABS 5,000 Units (has no administration in time range)  DULoxetine (CYMBALTA) DR capsule 30 mg (has no administration in time range)  gabapentin (NEURONTIN) capsule 300 mg (has no administration in time range)  levothyroxine (SYNTHROID) tablet 50 mcg (has no administration in time range)  acidophilus (RISAQUAD) capsule 1 capsule (has no administration in time range)  cycloSPORINE (RESTASIS) 0.05 % ophthalmic emulsion 1 drop (has no administration in time range)  tiZANidine (ZANAFLEX) tablet 4 mg (has no administration in time range)  ezetimibe (ZETIA) tablet 10 mg (has no administration in time range)  dexamethasone (DECADRON) injection 10 mg (10 mg Intravenous Given 09/08/21 1448)    ED Course  I have reviewed the triage vital signs and the nursing notes.  Pertinent labs & imaging results that were available during my care of the patient were reviewed by me and considered in my medical decision making (see chart for details).    MDM Rules/Calculators/A&P                          75 year old female presenting to the emergency department with acute hypoxic respiratory failure in the setting of new diagnosis of COVID-19.  The patient has a history of CLL.  She presents with a new oxygen requirement to 2 L O2 via nasal cannula with a positive COVID antigen test earlier today.  Symptoms of been present since Monday.  On arrival, the patient was afebrile, not tachycardic, mildly tachypneic, mildly hypertensive BP 160/97, hypoxic on room air, subsequently placed on 2 L O2 via nasal cannula.  Stable on 2 L.  Screening  work-up initiated to include CBC which reveals a leukocytosis to 82 consistent with the patient's known diagnosis of CLL. Chest x-ray revealed patchy multifocal opacities consistent with COVID-19.  Given the patient's new respiratory failure in the setting of COVID-19, Decadron and remdesivir were ordered.  Hospitalist medicine was consulted for admission. Final  Clinical Impression(s) / ED Diagnoses Final diagnoses:  COVID-19  Acute respiratory failure with hypoxia [facility])    Rx / DC Orders ED Discharge Orders     None        Regan Lemming, MD 09/09/21 1019

## 2021-09-08 NOTE — Progress Notes (Signed)
Per patient's report and witness of symptoms of cough, low oxygen saturations, shortness of breath, elevated heart rate, and fatigue. The patient was sent to her local emergency room for evaluation and treatment of COVID per Dr. Melford Aase.

## 2021-09-08 NOTE — ED Notes (Signed)
Report given to [facility]. 

## 2021-09-08 NOTE — ED Notes (Signed)
Pt up to [facility] with assistance.

## 2021-09-08 NOTE — ED Notes (Signed)
Report called to Jenetta Downer, Therapist, sports, 5E at [facility].

## 2021-09-09 ENCOUNTER — Ambulatory Visit: Payer: Medicare Other | Admitting: Nurse Practitioner

## 2021-09-09 ENCOUNTER — Encounter (HOSPITAL_COMMUNITY): Payer: Self-pay | Admitting: Internal Medicine

## 2021-09-09 DIAGNOSIS — G4733 Obstructive sleep apnea (adult) (pediatric): Secondary | ICD-10-CM

## 2021-09-09 DIAGNOSIS — E782 Mixed hyperlipidemia: Secondary | ICD-10-CM

## 2021-09-09 DIAGNOSIS — J1282 Pneumonia due to coronavirus disease 2019: Secondary | ICD-10-CM

## 2021-09-09 DIAGNOSIS — K219 Gastro-esophageal reflux disease without esophagitis: Secondary | ICD-10-CM

## 2021-09-09 DIAGNOSIS — F418 Other specified anxiety disorders: Secondary | ICD-10-CM

## 2021-09-09 DIAGNOSIS — F325 Major depressive disorder, single episode, in full remission: Secondary | ICD-10-CM

## 2021-09-09 DIAGNOSIS — C9110 Chronic lymphocytic leukemia of B-cell type not having achieved remission: Secondary | ICD-10-CM

## 2021-09-09 DIAGNOSIS — U071 COVID-19: Principal | ICD-10-CM

## 2021-09-09 LAB — CREATININE, SERUM
Creatinine, Ser: 0.91 mg/dL (ref 0.44–1.00)
GFR, Estimated: >60 mL/min (ref >60)

## 2021-09-09 LAB — COMPREHENSIVE METABOLIC PANEL
ALT: 21 U/L (ref 0–44)
AST: 34 U/L (ref 15–41)
Albumin: 4.1 g/dL (ref 3.5–5.0)
Alkaline Phosphatase: 77 U/L (ref 38–126)
Anion gap: 16 — ABNORMAL HIGH (ref 5–15)
BUN: 31 mg/dL — ABNORMAL HIGH (ref 8–23)
CO2: 17 mmol/L — ABNORMAL LOW (ref 22–32)
Calcium: 9.1 mg/dL (ref 8.9–10.3)
Chloride: 96 mmol/L — ABNORMAL LOW (ref 98–111)
Creatinine, Ser: 1.03 mg/dL — ABNORMAL HIGH (ref 0.44–1.00)
GFR, Estimated: 57 mL/min — ABNORMAL LOW (ref >60)
Glucose, Bld: 128 mg/dL — ABNORMAL HIGH (ref 70–99)
Potassium: 4.2 mmol/L (ref 3.5–5.1)
Sodium: 129 mmol/L — ABNORMAL LOW (ref 135–145)
Total Bilirubin: 1.6 mg/dL — ABNORMAL HIGH (ref 0.3–1.2)
Total Protein: 7.4 g/dL (ref 6.5–8.1)

## 2021-09-09 LAB — CBC
HCT: 30.6 % — ABNORMAL LOW (ref 36.0–46.0)
Hemoglobin: 10.2 g/dL — ABNORMAL LOW (ref 12.0–15.0)
MCH: 33.7 pg (ref 26.0–34.0)
MCHC: 33.3 g/dL (ref 30.0–36.0)
MCV: 101 fL — ABNORMAL HIGH (ref 80.0–100.0)
Platelets: 79 10*3/uL — ABNORMAL LOW (ref 150–400)
RBC: 3.03 MIL/uL — ABNORMAL LOW (ref 3.87–5.11)
RDW: 13.3 % (ref 11.5–15.5)
WBC: 81.6 10*3/uL (ref 4.0–10.5)
nRBC: 0 % (ref 0.0–0.2)

## 2021-09-09 MED ORDER — IPRATROPIUM-ALBUTEROL 20-100 MCG/ACT IN AERS
1.0000 | INHALATION_SPRAY | Freq: Four times a day (QID) | RESPIRATORY_TRACT | Status: DC
  Administered 2021-09-09 – 2021-09-10 (×8): 1 via RESPIRATORY_TRACT
  Filled 2021-09-09: qty 4

## 2021-09-09 MED ORDER — ZINC SULFATE 220 (50 ZN) MG PO CAPS
220.0000 mg | ORAL_CAPSULE | Freq: Every day | ORAL | Status: DC
  Administered 2021-09-09 – 2021-09-12 (×4): 220 mg via ORAL
  Filled 2021-09-09 (×4): qty 1

## 2021-09-09 MED ORDER — PANTOPRAZOLE SODIUM 40 MG PO TBEC
40.0000 mg | DELAYED_RELEASE_TABLET | Freq: Every day | ORAL | Status: DC
  Administered 2021-09-09 – 2021-09-12 (×4): 40 mg via ORAL
  Filled 2021-09-09 (×4): qty 1

## 2021-09-09 MED ORDER — GABAPENTIN 300 MG PO CAPS: 300.0000 mg | ORAL_CAPSULE | Freq: Every day | ORAL | Status: DC

## 2021-09-09 MED ORDER — ASCORBIC ACID 500 MG PO TABS
500.0000 mg | ORAL_TABLET | Freq: Every day | ORAL | Status: DC
  Administered 2021-09-09 – 2021-09-12 (×4): 500 mg via ORAL
  Filled 2021-09-09 (×5): qty 1

## 2021-09-09 MED ORDER — ASCORBIC ACID 500 MG PO TABS: 500.0000 mg | ORAL_TABLET | Freq: Every day | ORAL | Status: DC

## 2021-09-09 MED ORDER — DULOXETINE HCL 30 MG PO CPEP: 30.0000 mg | ORAL_CAPSULE | Freq: Two times a day (BID) | ORAL | Status: DC

## 2021-09-09 MED ORDER — RISAQUAD PO CAPS
1.0000 | ORAL_CAPSULE | Freq: Every morning | ORAL | Status: DC
  Administered 2021-09-09 – 2021-09-12 (×4): 1 via ORAL
  Filled 2021-09-09 (×4): qty 1

## 2021-09-09 MED ORDER — LEVOTHYROXINE SODIUM 50 MCG PO TABS
50.0000 ug | ORAL_TABLET | Freq: Every day | ORAL | Status: DC
  Administered 2021-09-10 – 2021-09-12 (×3): 50 ug via ORAL
  Filled 2021-09-09 (×4): qty 1

## 2021-09-09 MED ORDER — CYCLOSPORINE 0.05 % OP EMUL
1.0000 [drp] | Freq: Two times a day (BID) | OPHTHALMIC | Status: DC | PRN
  Filled 2021-09-09: qty 30

## 2021-09-09 MED ORDER — MELATONIN 5 MG PO TABS
5.0000 mg | ORAL_TABLET | Freq: Every day | ORAL | Status: DC
  Administered 2021-09-09 – 2021-09-11 (×4): 5 mg via ORAL
  Filled 2021-09-09 (×4): qty 1

## 2021-09-09 MED ORDER — ONDANSETRON HCL 4 MG/2ML IJ SOLN: 4.0000 mg | Freq: Four times a day (QID) | INTRAMUSCULAR | Status: DC | PRN

## 2021-09-09 MED ORDER — EZETIMIBE 10 MG PO TABS
10.0000 mg | ORAL_TABLET | Freq: Every day | ORAL | Status: DC
  Administered 2021-09-09 – 2021-09-11 (×3): 10 mg via ORAL
  Filled 2021-09-09 (×4): qty 1

## 2021-09-09 MED ORDER — SODIUM CHLORIDE 0.9 % IV SOLN
100.0000 mg | Freq: Two times a day (BID) | INTRAVENOUS | Status: DC
  Administered 2021-09-09 – 2021-09-11 (×5): 100 mg via INTRAVENOUS
  Filled 2021-09-09 (×5): qty 100

## 2021-09-09 MED ORDER — HYDROCOD POLST-CPM POLST ER 10-8 MG/5ML PO SUER
5.0000 mL | Freq: Two times a day (BID) | ORAL | Status: DC | PRN
Start: 2021-09-09 — End: 2021-09-12

## 2021-09-09 MED ORDER — ENOXAPARIN SODIUM 40 MG/0.4ML IJ SOSY
40.0000 mg | PREFILLED_SYRINGE | INTRAMUSCULAR | Status: DC
  Administered 2021-09-09 – 2021-09-11 (×3): 40 mg via SUBCUTANEOUS
  Filled 2021-09-09 (×3): qty 0.4

## 2021-09-09 MED ORDER — DEXAMETHASONE SODIUM PHOSPHATE 10 MG/ML IJ SOLN
6.0000 mg | INTRAMUSCULAR | Status: DC
Start: 2021-09-09 — End: 2021-09-12
  Administered 2021-09-09 – 2021-09-12 (×4): 6 mg via INTRAVENOUS
  Filled 2021-09-09 (×4): qty 1

## 2021-09-09 MED ORDER — ALPRAZOLAM 1 MG PO TABS
1.0000 mg | ORAL_TABLET | Freq: Three times a day (TID) | ORAL | Status: DC | PRN
  Administered 2021-09-09 – 2021-09-10 (×2): 1 mg via ORAL
  Filled 2021-09-09 (×3): qty 1

## 2021-09-09 MED ORDER — SODIUM CHLORIDE 0.9 % IV SOLN: 100.0000 mg | Freq: Every day | INTRAVENOUS | Status: DC

## 2021-09-09 MED ORDER — LOPERAMIDE HCL 2 MG PO CAPS: 2.0000 mg | ORAL_CAPSULE | Freq: Four times a day (QID) | ORAL | Status: DC | PRN

## 2021-09-09 MED ORDER — GUAIFENESIN-DM 100-10 MG/5ML PO SYRP: 10.0000 mL | ORAL_SOLUTION | ORAL | Status: DC | PRN

## 2021-09-09 MED ORDER — ONDANSETRON HCL 4 MG PO TABS: 4.0000 mg | ORAL_TABLET | Freq: Four times a day (QID) | ORAL | Status: DC | PRN

## 2021-09-09 MED ORDER — ALPRAZOLAM 0.25 MG PO TABS
0.2500 mg | ORAL_TABLET | Freq: Three times a day (TID) | ORAL | Status: DC | PRN
  Administered 2021-09-09: 0.25 mg via ORAL
  Filled 2021-09-09: qty 1

## 2021-09-09 MED ORDER — ACETAMINOPHEN 325 MG PO TABS: 650.0000 mg | ORAL_TABLET | Freq: Four times a day (QID) | ORAL | Status: DC | PRN

## 2021-09-09 MED ORDER — SODIUM CHLORIDE 0.9 % IV SOLN: 200.0000 mg | Freq: Once | INTRAVENOUS | Status: DC

## 2021-09-09 MED ORDER — VITAMIN D 25 MCG (1000 UNIT) PO TABS: 5000.0000 [IU] | ORAL_TABLET | Freq: Every morning | ORAL | Status: DC

## 2021-09-09 MED ORDER — FLUOXETINE HCL 20 MG PO CAPS: 20.0000 mg | ORAL_CAPSULE | Freq: Every day | ORAL | Status: DC

## 2021-09-09 MED ORDER — TIZANIDINE HCL 4 MG PO TABS: 4.0000 mg | ORAL_TABLET | Freq: Two times a day (BID) | ORAL | Status: DC

## 2021-09-09 MED ORDER — BUPROPION HCL ER (XL) 150 MG PO TB24
150.0000 mg | ORAL_TABLET | Freq: Every day | ORAL | Status: DC
  Administered 2021-09-09 – 2021-09-12 (×4): 150 mg via ORAL
  Filled 2021-09-09 (×4): qty 1

## 2021-09-09 MED ORDER — VITAMIN D 25 MCG (1000 UNIT) PO TABS
1000.0000 [IU] | ORAL_TABLET | Freq: Every morning | ORAL | Status: DC
  Administered 2021-09-10 – 2021-09-12 (×3): 1000 [IU] via ORAL
  Filled 2021-09-09 (×4): qty 1

## 2021-09-09 NOTE — H&P (Signed)
History and Physical   Rebecca Bond:[id number] DOB: 05-Jan-1946 DOA: 09/08/2021  Referring MD/NP/PA: Dr. Kellie Simmering  PCP: [name], MD   Outpatient Specialists: None  Patient coming from: Home via [place] at [place]: Cough and shortness of breath  HPI: Rebecca Bond is a 75 y.o. female with medical history significant of CLL, anxiety disorder, depression, prediabetic, osteoarthritis, GERD, history of kidney stones, essential hypertension, obstructive sleep apnea who presented to [facility] emergency room with cough shortness of breath.  Patient was seen and evaluated.  She is slightly hypoxic.  She was found to have COVID-19 pneumonia.  Patient is relatively immunosuppressed.  She is being admitted to the hospital therefore for further evaluation and treatment.  She denied any known sick contacts.  Patient is having cough now.  No chest pain.  She is currently afebrile..  ED Course: Temperature 99.1, blood pressure one 162/97, pulse 82 respiratory 28 oxygen sat 84% on room air.  Sodium 130, potassium 3.9 chloride 93 CO2 24 glucose 93.  White count 81.7 thousand.  COVID-19 screen is negative.  Chest x-ray showed patchy basilar and mild mid chest opacity consistent with COVID-19 pneumonia.  Review of Systems: As per HPI otherwise 10 point review of systems negative.    Past Medical History:  Diagnosis Date   Anemia    Anxiety    Arthritis    CLL (chronic lymphocytic leukemia) ([facility]) dx'd ~ 2015   Depression    GERD (gastroesophageal reflux disease)    Headache    "sinus headaches"   History of kidney stones    Hypertension    OSA (obstructive sleep apnea)    Osteoarthritis    Pneumonia    Prediabetes    Recurrent sinus infections 08/02/2012   Tubular adenoma of colon     Past Surgical History:  Procedure Laterality Date   ABDOMINAL HYSTERECTOMY  1980's   "endometrosis"   APPENDECTOMY     BREAST SURGERY     CATARACT EXTRACTION, BILATERAL      CHOLECYSTECTOMY  1985   FUNCTIONAL ENDOSCOPIC SINUS SURGERY  1990's   "cause I kept having sinus infections"   immunoglobulin treatment  2017   LAPAROSCOPIC APPENDECTOMY N/A 06/04/2014   Procedure: APPENDECTOMY LAPAROSCOPIC;  Surgeon: Zenovia Jarred, MD;  Location: [facility];  Service: General;  Laterality: N/A;   LUMBAR LAMINECTOMY/DECOMPRESSION MICRODISCECTOMY Left 11/01/2017   Procedure: Left Lumbar Four-Five Extraforaminal Microdiscectomy;  Surgeon: Eustace Moore, MD;  Location: [facility];  Service: Neurosurgery;  Laterality: Left;   LYMPH NODE BIOPSY     "determined I had CLL"   SHOULDER ARTHROSCOPY Right    TEMPOROMANDIBULAR JOINT ARTHROPLASTY  1980's   TONSILLECTOMY AND ADENOIDECTOMY  1950's     reports that she quit smoking about 52 years ago. Her smoking use included cigarettes. She has a 3.00 pack-year smoking history. She has never used smokeless tobacco. She reports current alcohol use of about 7.0 standard drinks per week. She reports that she does not use drugs.  Allergies  Allergen Reactions   Hyzaar [Losartan Potassium-Hctz] Swelling and Other (See Comments)    "messed up my sodium counts"and swelled lips   Sudafed [Pseudoephedrine Hcl] Palpitations   Levaquin [Levofloxacin In D5w] Diarrhea and Nausea Only   Acyclovir And Related Other (See Comments)    Reaction not recalled   Biaxin [Clarithromycin]     GI Upset   Cantaloupe Extract Allergy Skin Test Other (See Comments)    Nasal congestion, headaches, and sneezing  Celexa [Citalopram] Other (See Comments)    Reaction not recalled   Flexeril [Cyclobenzaprine]     "zombie-like" feeling   Fosamax [Alendronate Sodium]     GI upset   Gabapentin     confusion   Iohexol Hives and Other (See Comments)    Patient stated she broke out in hive 20 yrs ago from IV contrast      Losartan Swelling and Other (See Comments)    Angioedema   Meloxicam     GI upset   Norvasc [Amlodipine] Swelling and Other (See Comments)     Ankles swell   Other Nausea Only and Other (See Comments)    Iceburg lettuce- "made me dreadfully nauseous" Raw mushrooms only-  "made me dreadfully nauseous" (cooked ones are tolerated)   Pseudoephedrine     Palpitations   Zoloft [Sertraline Hcl] Other (See Comments)    Has no emotions at all     Family History  Problem Relation Age of Onset   Parkinson's disease Mother    Hypertension Mother    Hypertension Maternal Grandmother    Heart attack Maternal Grandmother        Mild   Heart attack Brother    Arthritis Brother      Prior to Admission medications   Medication Sig Start Date End Date Taking? Authorizing Provider  ALPRAZolam (XANAX) 1 MG tablet TAKE 1/2 - 1 TABLET 2 - 3 X /DAY ONLY IF NEEDED FOR PANIC ATTACK/ANXIETY ATTACK & LIMIT TO 5 DAYS /WEEK TO AVOID ADDICTION & DEMENTIA 05/10/21   [name], MD  Ascorbic Acid (VITAMIN C) 1000 MG tablet Take 500 mg by mouth daily.    [provider]  bisoprolol-hydrochlorothiazide [facility]) 10-6.25 MG tablet Take  1 tablet  Daily for BP 04/13/21   [name], MD  buPROPion (WELLBUTRIN XL) 150 MG 24 hr tablet TAKE 1 TABLET BY MOUTH EVERY MORNING FOR MOOD OR DEPRESSION OR FOCUS OR CONCENTRATION 05/12/21   Liane Comber, NP  Cholecalciferol (VITAMIN D3) 250 MCG (10000 UT) TABS Take 5,000 Units by mouth in the morning.    [provider]  ezetimibe (ZETIA) 10 MG tablet TAKE ONE TABLET BY MOUTH DAILY FOR CHOLESTEROL 08/13/21   Liane Comber, NP  FLUoxetine (PROZAC) 20 MG capsule TAKE 1 CAPSULE BY MOUTH DAILY FOR MOOD 04/16/21   Liane Comber, NP  levothyroxine (SYNTHROID) 50 MCG tablet Take 1 tablet (50 mcg total) by mouth daily. 05/26/21 05/26/22  Liane Comber, NP  Probiotic Product (PROBIOTIC PO) Take 1 capsule by mouth in the morning.     [provider]  RESTASIS 0.05 % ophthalmic emulsion Place 1 drop into both eyes 2 (two) times daily as needed (for seasonal allergies/irritation).  08/19/19   [provider]    Physical Exam: Vitals:   09/08/21 1938 09/08/21 2057 09/08/21 2203 09/08/21 2306  BP: (!) 140/58 103/61 (!) 123/58 (!) 144/76  Pulse: 74 66 69 68  Resp: 16 19 17 20   Temp:   98.4 F (36.9 C) 97.7 F (36.5 C)  TempSrc:   Oral Axillary  SpO2: 97% 98% 97% 99%  Weight:      Height:          Constitutional: Acutely ill looking no distress Vitals:   09/08/21 1938 09/08/21 2057 09/08/21 2203 09/08/21 2306  BP: (!) 140/58 103/61 (!) 123/58 (!) 144/76  Pulse: 74 66 69 68  Resp: 16 19 17 20   Temp:   98.4 F (36.9 C) 97.7 F (36.5  C)  TempSrc:   Oral Axillary  SpO2: 97% 98% 97% 99%  Weight:      Height:       Eyes: PERRL, lids and conjunctivae normal ENMT: Mucous membranes are moist. Posterior pharynx clear of any exudate or lesions.Normal dentition.  Neck: normal, supple, no masses, no thyromegaly Respiratory: decreased air entry bilaterally with some Rales no crackles normal respiratory effort. No accessory muscle use.  Cardiovascular: Regular rate and rhythm, no murmurs / rubs / gallops. No extremity edema. 2+ pedal pulses. No carotid bruits.  Abdomen: no tenderness, no masses palpated. No hepatosplenomegaly. Bowel sounds positive.  Musculoskeletal: no clubbing / cyanosis. No joint deformity upper and lower extremities. Good ROM, no contractures. Normal muscle tone.  Skin: no rashes, lesions, ulcers. No induration Neurologic: CN 2-12 grossly intact. Sensation intact, DTR normal. Strength 5/5 in all 4.  Psychiatric: Normal judgment and insight. Alert and oriented x 3. Normal mood.     Labs on Admission: I have personally reviewed following labs and imaging studies  CBC: Recent Labs  Lab 09/08/21 1450  WBC 81.7*  NEUTROABS 13.1*  HGB 11.1*  HCT 33.9*  MCV 99.1  PLT 89*   Basic Metabolic Panel: Recent Labs  Lab 09/08/21 1450  NA 130*  K 3.9  CL 93*  CO2 24  GLUCOSE 93  BUN 16  CREATININE 0.94  CALCIUM 8.9   GFR: Estimated Creatinine  Clearance: 54.2 mL/min (by C-G formula based on SCr of 0.94 mg/dL). Liver Function Tests: No results for input(s): AST, ALT, ALKPHOS, BILITOT, PROT, ALBUMIN in the last 168 hours. No results for input(s): LIPASE, AMYLASE in the last 168 hours. No results for input(s): AMMONIA in the last 168 hours. Coagulation Profile: No results for input(s): INR, PROTIME in the last 168 hours. Cardiac Enzymes: No results for input(s): CKTOTAL, CKMB, CKMBINDEX, TROPONINI in the last 168 hours. BNP (last 3 results) No results for input(s): PROBNP in the last 8760 hours. HbA1C: No results for input(s): HGBA1C in the last 72 hours. CBG: No results for input(s): GLUCAP in the last 168 hours. Lipid Profile: No results for input(s): CHOL, HDL, LDLCALC, TRIG, CHOLHDL, LDLDIRECT in the last 72 hours. Thyroid Function Tests: No results for input(s): TSH, T4TOTAL, FREET4, T3FREE, THYROIDAB in the last 72 hours. Anemia Panel: No results for input(s): VITAMINB12, FOLATE, FERRITIN, TIBC, IRON, RETICCTPCT in the last 72 hours. Urine analysis:    Component Value Date/Time   COLORURINE YELLOW 05/13/2021 1340   APPEARANCEUR CLEAR 05/13/2021 1340   LABSPEC 1.010 05/13/2021 1340   LABSPEC 1.010 10/08/2015 1610   PHURINE 5.5 05/13/2021 1340   GLUCOSEU NEGATIVE 05/13/2021 1340   GLUCOSEU Negative 10/08/2015 1610   HGBUR NEGATIVE 05/13/2021 1340   BILIRUBINUR NEGATIVE 04/08/2020 1515   BILIRUBINUR Negative 10/08/2015 1610   KETONESUR NEGATIVE 05/13/2021 1340   PROTEINUR NEGATIVE 05/13/2021 1340   UROBILINOGEN 0.2 10/08/2015 1610   NITRITE NEGATIVE 05/13/2021 1340   LEUKOCYTESUR TRACE (A) 05/13/2021 1340   LEUKOCYTESUR Small 10/08/2015 1610   Sepsis Labs: @LABRCNTIP (procalcitonin:4,lacticidven:4) )No results found for this or any previous visit (from the past 240 hour(s)).   Radiological Exams on Admission: DG Chest Portable 1 View  Result Date: 09/08/2021 CLINICAL DATA:  A 75 year old female presents with  cough and headache and history of COVID infection. EXAM: PORTABLE CHEST 1 VIEW COMPARISON:  Apr 17, 2018. FINDINGS: Trachea midline. Cardiomediastinal contours are stable with signs of cardiac enlargement. EKG leads project over the chest. Patchy basilar and mid chest opacities are  noted. No visible pneumothorax or lobar consolidative process. No sign of pleural effusion. On limited assessment there is no acute skeletal process. IMPRESSION: Patchy basilar and mid chest opacities could be seen in the setting of viral or atypical pneumonia. Compatible with provided history of COVID infection. Cardiomegaly as before. Electronically Signed   By: Zetta Bills M.D.   On: 09/08/2021 14:11    EKG: Independently reviewed.  Sinus rhythm  Assessment/Plan Principal Problem:   Pneumonia due to COVID-19 virus Active Problems:   CLL (chronic lymphocytic leukemia) (HCC)   Class 2 obesity due to excess calories with body mass index (BMI) of 37.0 to 37.9 in adult   Depression, major, in remission (HCC)   GERD (gastroesophageal reflux disease)   OSA (obstructive sleep apnea)   Mixed hyperlipidemia   Anxiety with depression     #1 acute respiratory failure with hypoxia secondary to COVID-19 pneumonia: Patient will be admitted to medical floor.  Initiate COVID-19 protocol.  IV dexamethasone, remdesivir, daily labs, oxygenation.    #2 CLL: Does not appear to be in remission. Follows with Dr. Irene Limbo with last visit seems to be in August.  She was diagnosed 7 years ago with lymph node biopsy.  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  Patient has been reluctant to consider permanent treatment.  Has been on IVIG in the past to reduce infections.  She will follow-up with oncology at discharge.  #3 GERD: Confirm on continue with PPIs  #4 hyperlipidemia: Continue statins  #5 anxiety with depression: Confirm and resume home regimen.  #6 morbid obesity: Dietary counseling  #7 obstructive sleep apnea: Offer  CPAP at night   DVT prophylaxis: Lovenox Code Status: Full code Family Communication: No family at bedside Disposition Plan: Home Consults called: None Admission status: Inpatient  Severity of Illness: The appropriate patient status for this patient is INPATIENT. Inpatient status is judged to be reasonable and necessary in order to provide the required intensity of service to ensure the patient's safety. The patient's presenting symptoms, physical exam findings, and initial radiographic and laboratory data in the context of their chronic comorbidities is felt to place them at high risk for further clinical deterioration. Furthermore, it is not anticipated that the patient will be medically stable for discharge from the hospital within 2 midnights of admission. The following factors support the patient status of inpatient.   " The patient's presenting symptoms include shortness of breath and cough. " The worrisome physical exam findings include decreased air entry bilaterally with hypoxia. " The initial radiographic and laboratory data are worrisome because of chest x-ray consistent with COVID-pneumonia. " The chronic co-morbidities include CLL.   * I certify that at the point of admission it is my clinical judgment that the patient will require inpatient hospital care spanning beyond 2 midnights from the point of admission due to high intensity of service, high risk for further deterioration and high frequency of surveillance required.Barbette Merino MD [facility] Pager [phone]  If 7PM-7AM, please contact night-coverage [email or web address] Password [email or web address]  09/09/2021, 12:04 AM

## 2021-09-09 NOTE — Progress Notes (Addendum)
Same day note  Patient seen and examined at bedside.  Patient was admitted to the hospital for shortness of breath.  At the time of my evaluation, patient complains of having extreme anxiety, shortness of breath.  Physical examination reveals anxious female, short of breath and tachypneic, on nasal cannula oxygen  Laboratory data and imaging was reviewed  Assessment and Plan.  Acute respiratory failure with hypoxia secondary to COVID-19 pneumonia: Continue IV steroids, remdesivir. on supplemental oxygen.  We will continue with that.  Follow inflammatory markers.  Oxygen as able.  CLL: Patient follows up with with Dr. Irene Limbo oncology as outpatient.  Last visit on August, 2022.  Patient was diagnosed 7 years back with a lymph node biopsy.In the past was treated with IVIG to reduce infections.  She will follow-up with oncology at discharge.   GERD: Add Protonix   hyperlipidemia: Continue Zetia   Anxiety with depression:.  Continue Xanax, fluoxetine.  Patient stated that she does not take Neurontin Cymbalta or Zanaflex at home.   morbid obesity: Benefit from weight loss as outpatient.   obstructive sleep apnea: CPAP ordered for the night.  No Charge  Signed,  Delila Pereyra, MD [facility]

## 2021-09-09 NOTE — Progress Notes (Signed)
Refused cpap.

## 2021-09-09 NOTE — Plan of Care (Signed)
  Problem: Education: Goal: Knowledge of General Education information will improve Description: Including pain rating scale, medication(s)/side effects and non-pharmacologic comfort measures Outcome: Progressing   Problem: Health Behavior/Discharge Planning: Goal: Ability to manage health-related needs will improve Outcome: Progressing   Problem: Clinical Measurements: Goal: Will remain free from infection Outcome: Progressing Goal: Diagnostic test results will improve Outcome: Progressing Goal: Respiratory complications will improve Outcome: Progressing Goal: Cardiovascular complication will be avoided Outcome: Progressing   Problem: Activity: Goal: Risk for activity intolerance will decrease Outcome: Progressing   Problem: Nutrition: Goal: Adequate nutrition will be maintained Outcome: Progressing   Problem: Coping: Goal: Level of anxiety will decrease Outcome: Progressing   Problem: Safety: Goal: Ability to remain free from injury will improve Outcome: Progressing   Problem: Skin Integrity: Goal: Risk for impaired skin integrity will decrease Outcome: Progressing

## 2021-09-09 NOTE — Progress Notes (Signed)
Patient refuses nocturnal CPAP.  

## 2021-09-09 NOTE — Progress Notes (Signed)
Pt educated on fall risk precautions. Fall risk bracelet applied. Non-skid footwear on pt. Pt refused to have bed alarm on, after pt was educated on fall precautions. Pt verbalized understanding of teaching.

## 2021-09-10 ENCOUNTER — Ambulatory Visit: Payer: Medicare Other | Admitting: Internal Medicine

## 2021-09-10 DIAGNOSIS — E6609 Other obesity due to excess calories: Secondary | ICD-10-CM

## 2021-09-10 DIAGNOSIS — Z6837 Body mass index (BMI) 37.0-37.9, adult: Secondary | ICD-10-CM

## 2021-09-10 LAB — CBC WITH DIFFERENTIAL/PLATELET
Abs Immature Granulocytes: 0.51 10*3/uL — ABNORMAL HIGH (ref 0.00–0.07)
Basophils Absolute: 0 10*3/uL (ref 0.0–0.1)
Basophils Relative: 0 %
Eosinophils Absolute: 0 10*3/uL (ref 0.0–0.5)
Eosinophils Relative: 0 %
HCT: 28.8 % — ABNORMAL LOW (ref 36.0–46.0)
Hemoglobin: 9.6 g/dL — ABNORMAL LOW (ref 12.0–15.0)
Immature Granulocytes: 1 %
Lymphocytes Relative: 85 %
Lymphs Abs: 68.4 10*3/uL — ABNORMAL HIGH (ref 0.7–4.0)
MCH: 33.3 pg (ref 26.0–34.0)
MCHC: 33.3 g/dL (ref 30.0–36.0)
MCV: 100 fL (ref 80.0–100.0)
Monocytes Absolute: 0.4 10*3/uL (ref 0.1–1.0)
Monocytes Relative: 1 %
Neutro Abs: 10.3 10*3/uL — ABNORMAL HIGH (ref 1.7–7.7)
Neutrophils Relative %: 13 %
Platelets: 87 10*3/uL — ABNORMAL LOW (ref 150–400)
RBC: 2.88 MIL/uL — ABNORMAL LOW (ref 3.87–5.11)
RDW: 13.5 % (ref 11.5–15.5)
WBC Morphology: ABNORMAL
WBC: 79.6 10*3/uL (ref 4.0–10.5)
nRBC: 0 % (ref 0.0–0.2)

## 2021-09-10 LAB — COMPREHENSIVE METABOLIC PANEL
ALT: 18 U/L (ref 0–44)
AST: 27 U/L (ref 15–41)
Albumin: 3.4 g/dL — ABNORMAL LOW (ref 3.5–5.0)
Alkaline Phosphatase: 68 U/L (ref 38–126)
Anion gap: 9 (ref 5–15)
BUN: 39 mg/dL — ABNORMAL HIGH (ref 8–23)
CO2: 20 mmol/L — ABNORMAL LOW (ref 22–32)
Calcium: 8.7 mg/dL — ABNORMAL LOW (ref 8.9–10.3)
Chloride: 105 mmol/L (ref 98–111)
Creatinine, Ser: 1.03 mg/dL — ABNORMAL HIGH (ref 0.44–1.00)
GFR, Estimated: 57 mL/min — ABNORMAL LOW (ref >60)
Glucose, Bld: 131 mg/dL — ABNORMAL HIGH (ref 70–99)
Potassium: 3.5 mmol/L (ref 3.5–5.1)
Sodium: 134 mmol/L — ABNORMAL LOW (ref 135–145)
Total Bilirubin: 0.7 mg/dL (ref 0.3–1.2)
Total Protein: 6.4 g/dL — ABNORMAL LOW (ref 6.5–8.1)

## 2021-09-10 LAB — PHOSPHORUS: Phosphorus: 4 mg/dL (ref 2.5–4.6)

## 2021-09-10 LAB — D-DIMER, QUANTITATIVE: D-Dimer, Quant: 0.55 ug/mL-FEU — ABNORMAL HIGH (ref 0.00–0.50)

## 2021-09-10 LAB — C-REACTIVE PROTEIN: CRP: 20 mg/dL — ABNORMAL HIGH (ref <1.0)

## 2021-09-10 LAB — FERRITIN: Ferritin: 589 ng/mL — ABNORMAL HIGH (ref 11–307)

## 2021-09-10 LAB — MAGNESIUM: Magnesium: 2 mg/dL (ref 1.7–2.4)

## 2021-09-10 MED ORDER — IPRATROPIUM-ALBUTEROL 20-100 MCG/ACT IN AERS
1.0000 | INHALATION_SPRAY | Freq: Three times a day (TID) | RESPIRATORY_TRACT | Status: DC
  Administered 2021-09-11 – 2021-09-12 (×3): 1 via RESPIRATORY_TRACT

## 2021-09-10 NOTE — Progress Notes (Signed)
Patient continues to refuse nocturnal CPAP. Order changed to prn

## 2021-09-10 NOTE — Progress Notes (Signed)
Chaplain received a consult that patient wished to create or update advance directives. Due to COVID precautions, chaplain inquired about patient's wishes by phone.  Patient stated that she did not want to talk about this at this time and would follow up with her PCP when she is discharged.  [place], [place] Pager, [phone] 10:44 PM

## 2021-09-10 NOTE — Care Management Important Message (Signed)
Important Message  Patient Details IM Letter given to the Patient. Name: Rebecca Bond MRN: [id number] Date of Birth: 07-03-1946   Medicare Important Message Given:  Yes     Kerin Salen 09/10/2021, 10:50 AM

## 2021-09-10 NOTE — Progress Notes (Addendum)
PROGRESS NOTE  Rebecca Bond TGY:[id number] DOB: November 29, 1946 DOA: 09/08/2021 PCP: [name], MD   LOS: 2 days   Brief narrative:  Rebecca Bond is a 75 y.o. female with medical history significant of CLL, anxiety disorder, depression, prediabetic, osteoarthritis, GERD, history of kidney stones, essential hypertension, obstructive sleep apnea presented to hospital with cough shortness of breath and was noted to be hypoxic in the ED.  Patient was noted to have COVID-19 pneumonia and was admitted to hospital.  Initially she was tachypneic with hyponatremia hypokalemia and elevated leukocyte count at 81,000.  Chest x-ray showed patchy by basilar opacity consistent with COVID-19 pneumonia.  Patient was then admitted hospital for further evaluation and treatment.  pneumonia.  Assessment/Plan:  Principal Problem:   Pneumonia due to COVID-19 virus Active Problems:   CLL (chronic lymphocytic leukemia) (HCC)   Class 2 obesity due to excess calories with body mass index (BMI) of 37.0 to 37.9 in adult   Depression, major, in remission (HCC)   GERD (gastroesophageal reflux disease)   OSA (obstructive sleep apnea)   Mixed hyperlipidemia   Anxiety with depression  Acute respiratory failure with hypoxia secondary to COVID-19 pneumonia: Continue IV steroids, remdesivir. on supplemental oxygen.  Continue to wean oxygen as able.   Recent Labs    09/10/21 0509  DDIMER 0.55*  FERRITIN 589*  CRP 20.0*     CLL: Patient follows up with with Dr. Irene Limbo oncology as outpatient.  Last visit on August, 2022.  Patient was diagnosed 7 years back with a lymph node biopsy.In the past was treated with IVIG to reduce infections.  She will follow-up with oncology at discharge.   GERD: Continue Protonix   Hyperlipidemia: Continue Zetia   Anxiety with depression:.  Continue Xanax, fluoxetine.  Patient stated that she does not take Neurontin Cymbalta or Zanaflex at home.   Morbid obesity: Benefit from  weight loss as outpatient.   Obstructive sleep apnea: CPAP ordered for the night, patient has refused..Husband states that the machine is not working and she hasn't used in few years.   Weakness debility.  We will get PT evaluation.  We will check for oxygenation while ambulating.  DVT prophylaxis: enoxaparin (LOVENOX) injection 40 mg Start: 09/09/21 0600   Code Status: Full code  Family Communication: I spoke with the patient's husband and updated him about the clinical condition of the patient and potential for disposition if she continues to improve.  Status is: Inpatient  Remains inpatient appropriate because:Ongoing diagnostic testing needed not appropriate for outpatient work up, Unsafe d/c plan, IV treatments appropriate due to intensity of illness or inability to take PO, and Inpatient level of care appropriate due to severity of illness  Dispo: The patient is from: Home              Anticipated d/c is to: Home              Patient currently is not medically stable to d/c.   Difficult to place patient No  Consultants: None  Procedures: None  Anti-infectives:  Remdesivir Doxycycline  Anti-infectives (From admission, onward)    Start     Dose/Rate Route Frequency Ordered Stop   09/10/21 1000  remdesivir 100 mg in sodium chloride 0.9 % 100 mL IVPB  Status:  Discontinued       See Hyperspace for full Linked Orders Report.   100 mg 200 mL/hr over 30 Minutes Intravenous Daily 09/09/21 0003 09/09/21 0010   09/09/21 1000  remdesivir 100  mg in sodium chloride 0.9 % 100 mL IVPB        100 mg 200 mL/hr over 30 Minutes Intravenous Daily 09/08/21 1515 09/13/21 0959   09/09/21 0100  remdesivir 200 mg in sodium chloride 0.9% 250 mL IVPB  Status:  Discontinued       See Hyperspace for full Linked Orders Report.   200 mg 580 mL/hr over 30 Minutes Intravenous Once 09/09/21 0003 09/09/21 0010   09/09/21 0100  doxycycline (VIBRAMYCIN) 100 mg in sodium chloride 0.9 % 250 mL IVPB         100 mg 125 mL/hr over 120 Minutes Intravenous Every 12 hours 09/09/21 0003     09/08/21 1530  remdesivir 100 mg in sodium chloride 0.9 % 100 mL IVPB        100 mg 200 mL/hr over 30 Minutes Intravenous Every 30 min 09/08/21 1515 09/08/21 1629      Subjective: Today, patient was seen and examined at bedside.  Patient appears to be little less anxious in her breathing has improved.  Still has mild shortness of breath and cough.  Objective: Vitals:   09/10/21 1049 09/10/21 1200  BP:  139/79  Pulse:  69  Resp:  16  Temp:  98.1 F (36.7 C)  SpO2: 97% 98%    Intake/Output Summary (Last 24 hours) at 09/10/2021 1345 Last data filed at 09/10/2021 1041 Gross per 24 hour  Intake 931.85 ml  Output 350 ml  Net 581.85 ml   Filed Weights   09/08/21 1327  Weight: 90 kg   Body mass index is 36.88 kg/m.   Physical Exam: GENERAL: Patient is alert awake and oriented. Not in obvious distress.  Obese built, on nasal cannula oxygen HENT: No scleral pallor or icterus. Pupils equally reactive to light. Oral mucosa is moist NECK: is supple, no gross swelling noted. CHEST: Diminished breath sounds bilaterally, coarse breath sounds noted. CVS: S1 and S2 heard, no murmur. Regular rate and rhythm.  ABDOMEN: Soft, non-tender, bowel sounds are present. EXTREMITIES: No edema. CNS: Cranial nerves are intact. No focal motor deficits. SKIN: warm and dry without rashes.  Data Review: I have personally reviewed the following laboratory data and studies,  CBC: Recent Labs  Lab 09/08/21 1450 09/09/21 0512 09/10/21 0509  WBC 81.7* 81.6* 79.6*  NEUTROABS 13.1*  --  10.3*  HGB 11.1* 10.2* 9.6*  HCT 33.9* 30.6* 28.8*  MCV 99.1 101.0* 100.0  PLT 89* 79* 87*   Basic Metabolic Panel: Recent Labs  Lab 09/08/21 1450 09/09/21 0512 09/09/21 0958 09/10/21 0509  NA 130*  --  129* 134*  K 3.9  --  4.2 3.5  CL 93*  --  96* 105  CO2 24  --  17* 20*  GLUCOSE 93  --  128* 131*  BUN 16  --  31* 39*   CREATININE 0.94 0.91 1.03* 1.03*  CALCIUM 8.9  --  9.1 8.7*  MG  --   --   --  2.0  PHOS  --   --   --  4.0   Liver Function Tests: Recent Labs  Lab 09/09/21 0958 09/10/21 0509  AST 34 27  ALT 21 18  ALKPHOS 77 68  BILITOT 1.6* 0.7  PROT 7.4 6.4*  ALBUMIN 4.1 3.4*   No results for input(s): LIPASE, AMYLASE in the last 168 hours. No results for input(s): AMMONIA in the last 168 hours. Cardiac Enzymes: No results for input(s): CKTOTAL, CKMB, CKMBINDEX, TROPONINI in the last 168  hours. BNP (last 3 results) No results for input(s): BNP in the last 8760 hours.  ProBNP (last 3 results) No results for input(s): PROBNP in the last 8760 hours.  CBG: No results for input(s): GLUCAP in the last 168 hours. No results found for this or any previous visit (from the past 240 hour(s)).   Studies: DG Chest Portable 1 View  Result Date: 09/08/2021 CLINICAL DATA:  A 75 year old female presents with cough and headache and history of COVID infection. EXAM: PORTABLE CHEST 1 VIEW COMPARISON:  Apr 17, 2018. FINDINGS: Trachea midline. Cardiomediastinal contours are stable with signs of cardiac enlargement. EKG leads project over the chest. Patchy basilar and mid chest opacities are noted. No visible pneumothorax or lobar consolidative process. No sign of pleural effusion. On limited assessment there is no acute skeletal process. IMPRESSION: Patchy basilar and mid chest opacities could be seen in the setting of viral or atypical pneumonia. Compatible with provided history of COVID infection. Cardiomegaly as before. Electronically Signed   By: Zetta Bills M.D.   On: 09/08/2021 14:11      Flora Lipps, MD  [facility] 09/10/2021  If 7PM-7AM, please contact night-coverage

## 2021-09-11 LAB — COMPREHENSIVE METABOLIC PANEL
ALT: 18 U/L (ref 0–44)
AST: 24 U/L (ref 15–41)
Albumin: 3.4 g/dL — ABNORMAL LOW (ref 3.5–5.0)
Alkaline Phosphatase: 55 U/L (ref 38–126)
Anion gap: 11 (ref 5–15)
BUN: 40 mg/dL — ABNORMAL HIGH (ref 8–23)
CO2: 21 mmol/L — ABNORMAL LOW (ref 22–32)
Calcium: 8.6 mg/dL — ABNORMAL LOW (ref 8.9–10.3)
Chloride: 101 mmol/L (ref 98–111)
Creatinine, Ser: 1.05 mg/dL — ABNORMAL HIGH (ref 0.44–1.00)
GFR, Estimated: 56 mL/min — ABNORMAL LOW (ref >60)
Glucose, Bld: 115 mg/dL — ABNORMAL HIGH (ref 70–99)
Potassium: 3.4 mmol/L — ABNORMAL LOW (ref 3.5–5.1)
Sodium: 133 mmol/L — ABNORMAL LOW (ref 135–145)
Total Bilirubin: 0.7 mg/dL (ref 0.3–1.2)
Total Protein: 6.1 g/dL — ABNORMAL LOW (ref 6.5–8.1)

## 2021-09-11 LAB — CBC WITH DIFFERENTIAL/PLATELET
Abs Immature Granulocytes: 0.45 10*3/uL — ABNORMAL HIGH (ref 0.00–0.07)
Basophils Absolute: 0 10*3/uL (ref 0.0–0.1)
Basophils Relative: 0 %
Eosinophils Absolute: 0 10*3/uL (ref 0.0–0.5)
Eosinophils Relative: 0 %
HCT: 29.6 % — ABNORMAL LOW (ref 36.0–46.0)
Hemoglobin: 9.6 g/dL — ABNORMAL LOW (ref 12.0–15.0)
Immature Granulocytes: 1 %
Lymphocytes Relative: 87 %
Lymphs Abs: 68.2 10*3/uL — ABNORMAL HIGH (ref 0.7–4.0)
MCH: 32.7 pg (ref 26.0–34.0)
MCHC: 32.4 g/dL (ref 30.0–36.0)
MCV: 100.7 fL — ABNORMAL HIGH (ref 80.0–100.0)
Monocytes Absolute: 0.6 10*3/uL (ref 0.1–1.0)
Monocytes Relative: 1 %
Neutro Abs: 8.2 10*3/uL — ABNORMAL HIGH (ref 1.7–7.7)
Neutrophils Relative %: 11 %
Platelets: 103 10*3/uL — ABNORMAL LOW (ref 150–400)
RBC: 2.94 MIL/uL — ABNORMAL LOW (ref 3.87–5.11)
RDW: 13.4 % (ref 11.5–15.5)
WBC Morphology: ABNORMAL
WBC: 77.4 10*3/uL (ref 4.0–10.5)
nRBC: 0 % (ref 0.0–0.2)

## 2021-09-11 LAB — MAGNESIUM: Magnesium: 2.1 mg/dL (ref 1.7–2.4)

## 2021-09-11 LAB — C-REACTIVE PROTEIN: CRP: 10.4 mg/dL — ABNORMAL HIGH (ref <1.0)

## 2021-09-11 LAB — D-DIMER, QUANTITATIVE: D-Dimer, Quant: 0.4 ug/mL-FEU (ref 0.00–0.50)

## 2021-09-11 LAB — PHOSPHORUS: Phosphorus: 3.5 mg/dL (ref 2.5–4.6)

## 2021-09-11 LAB — FERRITIN: Ferritin: 568 ng/mL — ABNORMAL HIGH (ref 11–307)

## 2021-09-11 MED ORDER — DOXYCYCLINE HYCLATE 100 MG PO TABS
100.0000 mg | ORAL_TABLET | Freq: Two times a day (BID) | ORAL | Status: DC
  Administered 2021-09-11 – 2021-09-12 (×3): 100 mg via ORAL
  Filled 2021-09-11 (×3): qty 1

## 2021-09-11 MED ORDER — MAGNESIUM OXIDE -MG SUPPLEMENT 400 (240 MG) MG PO TABS
400.0000 mg | ORAL_TABLET | Freq: Two times a day (BID) | ORAL | Status: DC
  Filled 2021-09-11 (×2): qty 1

## 2021-09-11 MED ORDER — ASCORBIC ACID 500 MG PO TABS: 500.0000 mg | ORAL_TABLET | Freq: Every day | ORAL | Status: DC

## 2021-09-11 MED ORDER — HYDRALAZINE HCL 20 MG/ML IJ SOLN
5.0000 mg | Freq: Three times a day (TID) | INTRAMUSCULAR | Status: DC | PRN
  Administered 2021-09-11: 5 mg via INTRAVENOUS
  Filled 2021-09-11: qty 1

## 2021-09-11 MED ORDER — BISOPROLOL-HYDROCHLOROTHIAZIDE 10-6.25 MG PO TABS
1.0000 | ORAL_TABLET | Freq: Every day | ORAL | Status: DC
  Administered 2021-09-11 – 2021-09-12 (×2): 1 via ORAL
  Filled 2021-09-11 (×2): qty 1

## 2021-09-11 MED ORDER — POTASSIUM CHLORIDE CRYS ER 20 MEQ PO TBCR
40.0000 meq | EXTENDED_RELEASE_TABLET | Freq: Once | ORAL | Status: AC
  Administered 2021-09-11: 40 meq via ORAL
  Filled 2021-09-11: qty 2

## 2021-09-11 MED ORDER — FLUOXETINE HCL 20 MG PO CAPS
20.0000 mg | ORAL_CAPSULE | Freq: Every day | ORAL | Status: DC
  Administered 2021-09-11 – 2021-09-12 (×2): 20 mg via ORAL
  Filled 2021-09-11 (×2): qty 1

## 2021-09-11 MED ORDER — HYDROCORTISONE 1 % EX CREA
1.0000 "application " | TOPICAL_CREAM | Freq: Four times a day (QID) | CUTANEOUS | Status: DC | PRN
  Filled 2021-09-11: qty 28

## 2021-09-11 NOTE — Progress Notes (Signed)
PHARMACIST - PHYSICIAN COMMUNICATION DR:   Louanne Belton CONCERNING: Antibiotic IV to Oral Route Change Policy  RECOMMENDATION: This patient is receiving doxycycline by the intravenous route.  Based on criteria approved by the Pharmacy and Therapeutics Committee, the antibiotic(s) is/are being converted to the equivalent oral dose form(s).   DESCRIPTION: These criteria include: Patient being treated for a respiratory tract infection, urinary tract infection, cellulitis or clostridium difficile associated diarrhea if on metronidazole The patient is not neutropenic and does not exhibit a GI malabsorption state The patient is eating (either orally or via tube) and/or has been taking other orally administered medications for a least 24 hours The patient is improving clinically and has a Tmax < 100.5  If you have questions about this conversion, please contact the Pharmacy Department  []   [phone] )  [facility] []   [phone] )  [facility] Pontes  []   [phone] )  [facility] [x]   [phone] )  [place], PharmD 09/11/2021 7:26 AM

## 2021-09-11 NOTE — Progress Notes (Addendum)
PROGRESS NOTE  Rebecca Bond KXF:[id number] DOB: 1946-07-23 DOA: 09/08/2021 PCP: [name], MD   LOS: 3 days   Brief narrative: Rebecca Bond is a 75 y.o. female with medical history significant of CLL, anxiety disorder, depression, prediabetic, osteoarthritis, GERD, history of kidney stones, essential hypertension, obstructive sleep apnea presented to hospital with cough, shortness of breath and was noted to be hypoxic in the ED.  Patient was noted to have COVID-19 pneumonia and was admitted to hospital.  Initially, patient was tachypneic with hyponatremia hypokalemia and elevated leukocyte count at 81,000.  Chest x-ray showed patchy by basilar opacity consistent with COVID-19 pneumonia.  Patient was then admitted hospital for further evaluation and treatment.  pneumonia.  Assessment/Plan:  Principal Problem:   Pneumonia due to COVID-19 virus Active Problems:   CLL (chronic lymphocytic leukemia) (HCC)   Class 2 obesity due to excess calories with body mass index (BMI) of 37.0 to 37.9 in adult   Depression, major, in remission (HCC)   GERD (gastroesophageal reflux disease)   OSA (obstructive sleep apnea)   Mixed hyperlipidemia   Anxiety with depression  Acute respiratory failure with hypoxia secondary to COVID-19 pneumonia: Continue IV steroids, remdesivir.  She has improved clinically.  Has been off supplemental oxygen at this time.  CRP and D-dimer has improved as well.  With history of immune suppression and CLL we will continue remdesivir to complete the course.  Recent Labs    09/10/21 0509 09/11/21 0552  DDIMER 0.55* 0.40  FERRITIN 589* 568*  CRP 20.0* 10.4*      CLL: Patient follows up with with Dr. Irene Limbo oncology as outpatient.  Last visit on August, 2022.  Patient was diagnosed 7 years back with a lymph node biopsy.In the past patient was treated with IVIG to reduce infections.  She will follow-up with oncology at discharge.   GERD: Continue Protonix    Hyperlipidemia: Continue Zetia   Anxiety with depression:.  Continue Xanax, fluoxetine.  Patient stated that she does not take Neurontin Cymbalta or Zanaflex at home.   Morbid obesity: Patient will benefit from weight loss as outpatient.   Obstructive sleep apnea: Has been refusing CPAP machine.  She does have a machine that is not working at home and has not used in years   Weakness, debility.  PT consulted.  Has been ambulating inside the room.  Hypokalemia.  Continue oral supplements.  Continue magnesium oxide as well.  Check levels in a.m.  DVT prophylaxis: enoxaparin (LOVENOX) injection 40 mg Start: 09/09/21 0600   Code Status: Full code  Family Communication:  None today.  Spoke with the patient's husband yesterday.   Status is: Inpatient  Remains inpatient appropriate because: IV treatments appropriate due to intensity of illness or inability to take PO, and Inpatient level of care appropriate due to severity of illness  Dispo: The patient is from: Home              Anticipated d/c is to: Home likely tomorrow if she continues to improve after completion of remdesivir.              Patient currently is not medically stable to d/c.   Difficult to place patient No  Consultants: None  Procedures: None  Anti-infectives:  Remdesivir Doxycycline  Anti-infectives (From admission, onward)    Start     Dose/Rate Route Frequency Ordered Stop   09/11/21 1000  doxycycline (VIBRA-TABS) tablet 100 mg        100 mg Oral Every  12 hours 09/11/21 0728     09/10/21 1000  remdesivir 100 mg in sodium chloride 0.9 % 100 mL IVPB  Status:  Discontinued       See Hyperspace for full Linked Orders Report.   100 mg 200 mL/hr over 30 Minutes Intravenous Daily 09/09/21 0003 09/09/21 0010   09/09/21 1000  remdesivir 100 mg in sodium chloride 0.9 % 100 mL IVPB        100 mg 200 mL/hr over 30 Minutes Intravenous Daily 09/08/21 1515 09/11/21 0939   09/09/21 0100  remdesivir 200 mg in  sodium chloride 0.9% 250 mL IVPB  Status:  Discontinued       See Hyperspace for full Linked Orders Report.   200 mg 580 mL/hr over 30 Minutes Intravenous Once 09/09/21 0003 09/09/21 0010   09/09/21 0100  doxycycline (VIBRAMYCIN) 100 mg in sodium chloride 0.9 % 250 mL IVPB  Status:  Discontinued        100 mg 125 mL/hr over 120 Minutes Intravenous Every 12 hours 09/09/21 0003 09/11/21 0728   09/08/21 1530  remdesivir 100 mg in sodium chloride 0.9 % 100 mL IVPB        100 mg 200 mL/hr over 30 Minutes Intravenous Every 30 min 09/08/21 1515 09/08/21 1629      Subjective: Today, patient was seen and examined at bedside.  Continues to feel better with breathing.  Denies any chest pain, fever, chills or rigor.  Objective: Vitals:   09/10/21 1950 09/11/21 0605  BP: (!) 157/86 (!) 152/70  Pulse: 68 64  Resp: 18 20  Temp: (!) 97.5 F (36.4 C) (!) 97.5 F (36.4 C)  SpO2: 97% 97%   No intake or output data in the 24 hours ending 09/11/21 1106  Filed Weights   09/08/21 1327  Weight: 90 kg   Body mass index is 36.88 kg/m.   Physical Exam:  General: Obese built, not in obvious distress, on room air, HENT:   No scleral pallor or icterus noted. Oral mucosa is moist.  Chest:    Diminished breath sounds bilaterally.  Coarse breath sounds noted. CVS: S1 &S2 heard. No murmur.  Regular rate and rhythm. Abdomen: Soft, nontender, nondistended.  Bowel sounds are heard.   Extremities: No cyanosis, clubbing or edema.  Peripheral pulses are palpable. Psych: Alert, awake and oriented, communicative, calmer today.  Less anxious. CNS:  No cranial nerve deficits.  Power equal in all extremities.   Skin: Warm and dry.  No rashes noted.  Data Review: I have personally reviewed the following laboratory data and studies,  CBC: Recent Labs  Lab 09/08/21 1450 09/09/21 0512 09/10/21 0509 09/11/21 0552  WBC 81.7* 81.6* 79.6* 77.4*  NEUTROABS 13.1*  --  10.3* 8.2*  HGB 11.1* 10.2* 9.6* 9.6*  HCT  33.9* 30.6* 28.8* 29.6*  MCV 99.1 101.0* 100.0 100.7*  PLT 89* 79* 87* 103*    Basic Metabolic Panel: Recent Labs  Lab 09/08/21 1450 09/09/21 0512 09/09/21 0958 09/10/21 0509 09/11/21 0552  NA 130*  --  129* 134* 133*  K 3.9  --  4.2 3.5 3.4*  CL 93*  --  96* 105 101  CO2 24  --  17* 20* 21*  GLUCOSE 93  --  128* 131* 115*  BUN 16  --  31* 39* 40*  CREATININE 0.94 0.91 1.03* 1.03* 1.05*  CALCIUM 8.9  --  9.1 8.7* 8.6*  MG  --   --   --  2.0 2.1  PHOS  --   --   --  4.0 3.5    Liver Function Tests: Recent Labs  Lab 09/09/21 0958 09/10/21 0509 09/11/21 0552  AST 34 27 24  ALT 21 18 18   ALKPHOS 77 68 55  BILITOT 1.6* 0.7 0.7  PROT 7.4 6.4* 6.1*  ALBUMIN 4.1 3.4* 3.4*    No results for input(s): LIPASE, AMYLASE in the last 168 hours. No results for input(s): AMMONIA in the last 168 hours. Cardiac Enzymes: No results for input(s): CKTOTAL, CKMB, CKMBINDEX, TROPONINI in the last 168 hours. BNP (last 3 results) No results for input(s): BNP in the last 8760 hours.  ProBNP (last 3 results) No results for input(s): PROBNP in the last 8760 hours.  CBG: No results for input(s): GLUCAP in the last 168 hours. No results found for this or any previous visit (from the past 240 hour(s)).   Studies: No results found.   Flora Lipps, MD  [facility] 09/11/2021  If 7PM-7AM, please contact night-coverage

## 2021-09-11 NOTE — Evaluation (Addendum)
Physical Therapy Evaluation Patient Details Name: Rebecca Bond MRN: [id number] DOB: 20-Jun-1946 Today's Date: 09/11/2021  History of Present Illness  Pt is a 75 y.o. female who presented to hospital with cough, shortness of breath and was noted to be hypoxic in the ED.  Patient was noted to have COVID-19 pneumonia. PMH significant for Chronic lymphocytic leukemia, anxiety disorder, depression, prediabetic, osteoarthritis, GERD, history of kidney stones, essential hypertension and OSA.   Clinical Impression  Pt is a 75 y.o. female with above HPI. Pt is independent with all functional mobility and demonstrates good safety awareness upon eval. Pt will have assist 24/7 from her husband. Pt is safe to continue mobilizing with nursing staff during stay. No skilled PT needs identified at this time. PT will delist. Please reconsult if mobility status changes.        Recommendations for follow up therapy are one component of a multi-disciplinary discharge planning process, led by the attending physician.  Recommendations may be updated based on patient status, additional functional criteria and insurance authorization.  Follow Up Recommendations No PT follow up    Equipment Recommendations  None recommended by PT    Recommendations for Other Services       Precautions / Restrictions Precautions Precautions: None Restrictions Weight Bearing Restrictions: No      Mobility  Bed Mobility               General bed mobility comments: standing at sink upon entry brushing teeth    Transfers Overall transfer level: Independent Equipment used: None             General transfer comment: safe hand placement demonstrated. Pt with knowledge to stand and steady self prior to ambulation.  Ambulation/Gait Ambulation/Gait assistance: Supervision;Independent Gait Distance (Feet): 100 Feet Assistive device: None Gait Pattern/deviations: Step-through pattern Gait velocity: decr    General Gait Details: supervision- independent for ambulation. limited to room due to COVID-19 restrictions. Pt demonstrated good energy conservation techinques and knowledge of importance of OOB mobility to maintain functional independence and reports she has been performing LE ther ex in room. no overt LOB observed. O2 sats stable throughout session.  Stairs            Wheelchair Mobility    Modified Rankin (Stroke Patients Only)       Balance Overall balance assessment: Independent                                           Pertinent Vitals/Pain Pain Assessment: No/denies pain    Home Living Family/patient expects to be discharged to:: Private residence Living Arrangements: Spouse/significant other Available Help at Discharge: Family;Neighbor Type of Home: House (town home) Home Access: Stairs to enter Entrance Stairs-Rails: None Technical brewer of Steps: 2 Home Layout: Two level Home Equipment: Grab bars - tub/shower;Shower seat Additional Comments: husband can assist at home. Supervision 24/7.    Prior Function Level of Independence: Independent         Comments: I with ALDs     Hand Dominance   Dominant Hand: Left    Extremity/Trunk Assessment   Upper Extremity Assessment Upper Extremity Assessment: Overall WFL for tasks assessed    Lower Extremity Assessment Lower Extremity Assessment: Overall WFL for tasks assessed    Cervical / Trunk Assessment Cervical / Trunk Assessment: Normal  Communication   Communication: No difficulties  Cognition Arousal/Alertness: Awake/alert  Behavior During Therapy: WFL for tasks assessed/performed Overall Cognitive Status: Within Functional Limits for tasks assessed                                        General Comments      Exercises     Assessment/Plan    PT Assessment Patent does not need any further PT services  PT Problem List         PT Treatment  Interventions      PT Goals (Current goals can be found in the Care Plan section)  Acute Rehab PT Goals Patient Stated Goal: Get better and have more energy. Feeling weak overall PT Goal Formulation: With patient Time For Goal Achievement: 09/25/21 Potential to Achieve Goals: Good    Frequency     Barriers to discharge        Co-evaluation               AM-PAC PT "6 Clicks" Mobility  Outcome Measure Help needed turning from your back to your side while in a flat bed without using bedrails?: None Help needed moving from lying on your back to sitting on the side of a flat bed without using bedrails?: None Help needed moving to and from a bed to a chair (including a wheelchair)?: None Help needed standing up from a chair using your arms (e.g., wheelchair or bedside chair)?: None Help needed to walk in hospital room?: None Help needed climbing 3-5 steps with a railing? : A Little 6 Click Score: 23    End of Session   Activity Tolerance: Patient tolerated treatment well Patient left:  (pt standing at sink putting ice in cup) Nurse Communication: Mobility status PT Visit Diagnosis: Unsteadiness on feet (R26.81)    Time: 0105-0128 PT Time Calculation (min) (ACUTE ONLY): 23 min   Charges:   PT Evaluation $PT Eval Low Complexity: 1 Low          Festus Barren PT, DPT  Acute Rehabilitation Services  Office [phone]  09/11/2021, 3:06 PM

## 2021-09-12 ENCOUNTER — Encounter (HOSPITAL_COMMUNITY): Payer: Self-pay | Admitting: Internal Medicine

## 2021-09-12 LAB — BASIC METABOLIC PANEL
Anion gap: 12 (ref 5–15)
BUN: 34 mg/dL — ABNORMAL HIGH (ref 8–23)
CO2: 20 mmol/L — ABNORMAL LOW (ref 22–32)
Calcium: 9.7 mg/dL (ref 8.9–10.3)
Chloride: 106 mmol/L (ref 98–111)
Creatinine, Ser: 0.94 mg/dL (ref 0.44–1.00)
GFR, Estimated: >60 mL/min (ref >60)
Glucose, Bld: 93 mg/dL (ref 70–99)
Potassium: 4.2 mmol/L (ref 3.5–5.1)
Sodium: 138 mmol/L (ref 135–145)

## 2021-09-12 LAB — CBC
HCT: 31.9 % — ABNORMAL LOW (ref 36.0–46.0)
Hemoglobin: 10.3 g/dL — ABNORMAL LOW (ref 12.0–15.0)
MCH: 32.3 pg (ref 26.0–34.0)
MCHC: 32.3 g/dL (ref 30.0–36.0)
MCV: 100 fL (ref 80.0–100.0)
Platelets: 145 10*3/uL — ABNORMAL LOW (ref 150–400)
RBC: 3.19 MIL/uL — ABNORMAL LOW (ref 3.87–5.11)
RDW: 13.7 % (ref 11.5–15.5)
WBC: 108.8 10*3/uL (ref 4.0–10.5)
nRBC: 0 % (ref 0.0–0.2)

## 2021-09-12 MED ORDER — DOXYCYCLINE HYCLATE 100 MG PO TABS: 100.0000 mg | ORAL_TABLET | Freq: Two times a day (BID) | ORAL | 0 refills | Status: AC

## 2021-09-12 MED ORDER — GUAIFENESIN-DM 100-10 MG/5ML PO SYRP
10.0000 mL | ORAL_SOLUTION | ORAL | 0 refills | Status: DC | PRN
Start: 2021-09-12 — End: 2021-09-14

## 2021-09-12 MED ORDER — ZINC SULFATE 220 (50 ZN) MG PO CAPS: 220.0000 mg | ORAL_CAPSULE | Freq: Every day | ORAL | Status: AC

## 2021-09-12 MED ORDER — DEXAMETHASONE 6 MG PO TABS: 6.0000 mg | ORAL_TABLET | Freq: Two times a day (BID) | ORAL | 0 refills | Status: DC

## 2021-09-12 NOTE — Progress Notes (Signed)
Went over discharge information w/ pt. Pt verbalized understanding.

## 2021-09-12 NOTE — Discharge Summary (Signed)
Physician Discharge Summary  Rebecca Bond WUX:[id number] DOB: [birth year] DOA: 09/08/2021  PCP: [name], MD  Admit date: 09/08/2021 Discharge date: 09/12/2021  Admitted From: Home  Discharge disposition: Home  Recommendations for Outpatient Follow-Up:   Follow up with your primary care provider in one week.  Check CBC, BMP, magnesium in the next visit  Discharge Diagnosis:   Principal Problem:   Pneumonia due to COVID-19 virus Active Problems:   CLL (chronic lymphocytic leukemia) (HCC)   Class 2 obesity due to excess calories with body mass index (BMI) of 37.0 to 37.9 in adult   Depression, major, in remission (HCC)   GERD (gastroesophageal reflux disease)   OSA (obstructive sleep apnea)   Mixed hyperlipidemia   Anxiety with depression   Discharge Condition: Improved.  Diet recommendation: Low sodium, heart healthy.  Wound care: None.  Code status: Full.  History of Present Illness:   Rebecca Bond is a 75 y.o. female with medical history significant of CLL, anxiety disorder, depression, prediabetic, osteoarthritis, GERD, history of kidney stones, essential hypertension, obstructive sleep apnea presented to the hospital with cough, shortness of breath and was noted to be hypoxic in the ED.  Initially, patient was tachypneic with hyponatremia hypokalemia and elevated leukocyte count at 81,000.  Chest x-ray showed patchy  basilar opacity consistent with COVID-19 pneumonia.  Patient was then admitted hospital for further evaluation and treatment.  pneumonia.   Hospital Course:   Following conditions were addressed during hospitalization as listed below,  Acute respiratory failure with hypoxia secondary to COVID-19 pneumonia: Has improved at this time.  Patient received IV steroids, remdesivir during hospitalization.  Was initially on supplemental oxygen which has been weaned off.  Inflammatory markers improved.  Patient will continue oral dexamethasone for  few more days on discharge.  CLL: Patient follows up with with Dr. Irene Limbo oncology as outpatient.  Last visit on August, 2022.  Patient was diagnosed 7 years back with a lymph node biopsy.In the past patient was treated with IVIG to reduce infections.  She will follow-up with oncology at discharge.   GERD: Continue PPI on discharge.   Hyperlipidemia: Continue Zetia   Anxiety with depression:.  Continue Xanax, bupropion, fluoxetine from home on discharge..     Morbid obesity: Patient will benefit from weight loss as outpatient.   Obstructive sleep apnea: Has been refusing CPAP machine.  She does have a machine that is not working at home and has not used in years.    Hypokalemia.  Improved after replacement.  Latest potassium of 4.2.  Weakness, debility.  PT consulted.  She did not require any skilled therapy on discharge.  Disposition.  At this time, patient is stable for disposition home with outpatient PCP follow-up for  Medical Consultants:   None.  Procedures:    None Subjective:   Today, patient was seen and examined at bedside.  Patient feels much better today breathing has improved.  Wishes to go home.  Denies any fever, chills or chest pain.  Discharge Exam:   Vitals:   09/12/21 0432 09/12/21 1022  BP: (!) 169/64 (!) 162/74  Pulse: (!) 59 (!) 54  Resp: 20   Temp: (!) 97.5 F (36.4 C)   SpO2: 96%    Vitals:   09/11/21 2023 09/11/21 2026 09/12/21 0432 09/12/21 1022  BP: (!) 153/119 (!) 170/65 (!) 169/64 (!) 162/74  Pulse: 61 (!) 56 (!) 59 (!) 54  Resp: 20  20   Temp: (!) 97.4 F (36.3  C) (!) 97.5 F (36.4 C) (!) 97.5 F (36.4 C)   TempSrc: Oral  Oral   SpO2: 97%  96%   Weight:      Height:       General: Alert awake, not in obvious distress, obese built HENT: pupils equally reacting to light,  No scleral pallor or icterus noted. Oral mucosa is moist.  Chest:    Diminished breath sounds bilaterally.  Coarse breath sounds. CVS: S1 &S2 heard. No murmur.   Regular rate and rhythm. Abdomen: Soft, nontender, nondistended.  Bowel sounds are heard.   Extremities: No cyanosis, clubbing or edema.  Peripheral pulses are palpable. Psych: Alert, awake and oriented, normal mood CNS:  No cranial nerve deficits.  Power equal in all extremities.   Skin: Warm and dry.  No rashes noted.  The results of significant diagnostics from this hospitalization (including imaging, microbiology, ancillary and laboratory) are listed below for reference.     Diagnostic Studies:   DG Chest Portable 1 View  Result Date: 09/08/2021 CLINICAL DATA:  A 75 year old female presents with cough and headache and history of COVID infection. EXAM: PORTABLE CHEST 1 VIEW COMPARISON:  Apr 17, 2018. FINDINGS: Trachea midline. Cardiomediastinal contours are stable with signs of cardiac enlargement. EKG leads project over the chest. Patchy basilar and mid chest opacities are noted. No visible pneumothorax or lobar consolidative process. No sign of pleural effusion. On limited assessment there is no acute skeletal process. IMPRESSION: Patchy basilar and mid chest opacities could be seen in the setting of viral or atypical pneumonia. Compatible with provided history of COVID infection. Cardiomegaly as before. Electronically Signed   By: Zetta Bills M.D.   On: 09/08/2021 14:11     Labs:   Basic Metabolic Panel: Recent Labs  Lab 09/08/21 1450 09/09/21 0512 09/09/21 0958 09/10/21 0509 09/11/21 0552 09/12/21 0549  NA 130*  --  129* 134* 133* 138  K 3.9  --  4.2 3.5 3.4* 4.2  CL 93*  --  96* 105 101 106  CO2 24  --  17* 20* 21* 20*  GLUCOSE 93  --  128* 131* 115* 93  BUN 16  --  31* 39* 40* 34*  CREATININE 0.94 0.91 1.03* 1.03* 1.05* 0.94  CALCIUM 8.9  --  9.1 8.7* 8.6* 9.7  MG  --   --   --  2.0 2.1  --   PHOS  --   --   --  4.0 3.5  --    GFR Estimated Creatinine Clearance: 54.2 mL/min (by C-G formula based on SCr of 0.94 mg/dL). Liver Function Tests: Recent Labs  Lab  09/09/21 0958 09/10/21 0509 09/11/21 0552  AST 34 27 24  ALT 21 18 18   ALKPHOS 77 68 55  BILITOT 1.6* 0.7 0.7  PROT 7.4 6.4* 6.1*  ALBUMIN 4.1 3.4* 3.4*   No results for input(s): LIPASE, AMYLASE in the last 168 hours. No results for input(s): AMMONIA in the last 168 hours. Coagulation profile No results for input(s): INR, PROTIME in the last 168 hours.  CBC: Recent Labs  Lab 09/08/21 1450 09/09/21 0512 09/10/21 0509 09/11/21 0552 09/12/21 0549  WBC 81.7* 81.6* 79.6* 77.4* 108.8*  NEUTROABS 13.1*  --  10.3* 8.2*  --   HGB 11.1* 10.2* 9.6* 9.6* 10.3*  HCT 33.9* 30.6* 28.8* 29.6* 31.9*  MCV 99.1 101.0* 100.0 100.7* 100.0  PLT 89* 79* 87* 103* 145*   Cardiac Enzymes: No results for input(s): CKTOTAL, CKMB, CKMBINDEX, TROPONINI in  the last 168 hours. BNP: Invalid input(s): POCBNP CBG: No results for input(s): GLUCAP in the last 168 hours. D-Dimer Recent Labs    09/10/21 0509 09/11/21 0552  DDIMER 0.55* 0.40   Hgb A1c No results for input(s): HGBA1C in the last 72 hours. Lipid Profile No results for input(s): CHOL, HDL, LDLCALC, TRIG, CHOLHDL, LDLDIRECT in the last 72 hours. Thyroid function studies No results for input(s): TSH, T4TOTAL, T3FREE, THYROIDAB in the last 72 hours.  Invalid input(s): FREET3 Anemia work up Recent Labs    09/10/21 0509 09/11/21 0552  FERRITIN 589* 568*   Microbiology No results found for this or any previous visit (from the past 240 hour(s)).   Discharge Instructions:   Discharge Instructions     Call MD for:  severe uncontrolled pain   Complete by: As directed    Call MD for:  temperature >100.4   Complete by: As directed    Diet - low sodium heart healthy   Complete by: As directed    Discharge instructions   Complete by: As directed    Follow-up with your primary care physician in 1 week.  No overexertion.  Complete the course of antibiotic and steroid.  Seek medical attention for worsening symptoms. Total isolation  of 10 days from the day of positive test.   Increase activity slowly   Complete by: As directed       Allergies as of 09/12/2021       Reactions   Hyzaar [losartan Potassium-hctz] Swelling, Other (See Comments)   "messed up my sodium counts"and swelled lips   Sudafed [pseudoephedrine Hcl] Palpitations   Levaquin [levofloxacin In D5w] Diarrhea, Nausea Only   Acyclovir And Related Other (See Comments)   Reaction not recalled   Biaxin [clarithromycin]    GI Upset   Cantaloupe Extract Allergy Skin Test Other (See Comments)   Nasal congestion, headaches, and sneezing   Celexa [citalopram] Other (See Comments)   Reaction not recalled   Flexeril [cyclobenzaprine]    "zombie-like" feeling   Fosamax [alendronate Sodium]    GI upset   Gabapentin    confusion   Iohexol Hives, Other (See Comments)   Patient stated she broke out in hive 20 yrs ago from IV contrast     Losartan Swelling, Other (See Comments)   Angioedema   Meloxicam    GI upset   Norvasc [amlodipine] Swelling, Other (See Comments)   Ankles swell   Other Nausea Only, Other (See Comments)   Iceburg lettuce- "made me dreadfully nauseous" Raw mushrooms only-  "made me dreadfully nauseous" (cooked ones are tolerated)   Pseudoephedrine    Palpitations   Zoloft [sertraline Hcl] Other (See Comments)   Has no emotions at all         Medication List     STOP taking these medications    DULoxetine 30 MG capsule Commonly known as: CYMBALTA   gabapentin 300 MG capsule Commonly known as: NEURONTIN   tiZANidine 4 MG tablet Commonly known as: ZANAFLEX       TAKE these medications    acetaminophen 325 MG tablet Commonly known as: TYLENOL Take 650 mg by mouth every 6 (six) hours as needed for mild pain, fever or headache.   ALPRAZolam 1 MG tablet Commonly known as: XANAX TAKE 1/2 - 1 TABLET 2 - 3 X /DAY ONLY IF NEEDED FOR PANIC ATTACK/ANXIETY ATTACK & LIMIT TO 5 DAYS /WEEK TO AVOID ADDICTION & DEMENTIA What  changed:  how much to take when  to take this reasons to take this additional instructions   bisoprolol-hydrochlorothiazide 10-6.25 MG tablet Commonly known as: ZIAC Take  1 tablet  Daily for BP   buPROPion 150 MG 24 hr tablet Commonly known as: WELLBUTRIN XL TAKE 1 TABLET BY MOUTH EVERY MORNING FOR MOOD OR DEPRESSION OR FOCUS OR CONCENTRATION What changed:  how much to take how to take this when to take this additional instructions   dexamethasone 6 MG tablet Commonly known as: DECADRON Take 1 tablet (6 mg total) by mouth 2 (two) times daily for 3 days.   doxycycline 100 MG tablet Commonly known as: VIBRA-TABS Take 1 tablet (100 mg total) by mouth 2 (two) times daily for 2 days.   ezetimibe 10 MG tablet Commonly known as: ZETIA TAKE ONE TABLET BY MOUTH DAILY FOR CHOLESTEROL What changed:  how much to take how to take this when to take this additional instructions   FLUoxetine 20 MG capsule Commonly known as: PROZAC TAKE 1 CAPSULE BY MOUTH DAILY FOR MOOD What changed:  how much to take how to take this when to take this additional instructions   guaiFENesin-dextromethorphan 100-10 MG/5ML syrup Commonly known as: ROBITUSSIN DM Take 10 mLs by mouth every 4 (four) hours as needed for cough.   hydrocortisone cream 1 % Apply 1 application topically 4 (four) times daily as needed for itching.   levothyroxine 50 MCG tablet Commonly known as: Synthroid Take 1 tablet (50 mcg total) by mouth daily.   omeprazole 20 MG capsule Commonly known as: PRILOSEC Take 20 mg by mouth 2 (two) times daily as needed (indigestion).   PROBIOTIC PO Take 1 capsule by mouth in the morning.   Restasis 0.05 % ophthalmic emulsion Generic drug: cycloSPORINE Place 1 drop into both eyes 2 (two) times daily as needed (for seasonal allergies/irritation).   vitamin C 500 MG tablet Commonly known as: ASCORBIC ACID Take 500 mg by mouth daily. What changed: Another medication with the same  name was removed. Continue taking this medication, and follow the directions you see here.   Vitamin D3 250 MCG (10000 UT) Tabs Take 5,000 Units by mouth in the morning.   ZINC PO Take 1 tablet by mouth daily.   zinc sulfate 220 (50 Zn) MG capsule Take 1 capsule (220 mg total) by mouth daily.        Follow-up Information     [name], MD Follow up in 1 week(s).   Specialty: Internal Medicine Contact information: [address] [place] [address] [phone]         Troy Sine, MD .   Specialty: Cardiology Contact information: [address] [place] [address] [phone]                  Time coordinating discharge: 39 minutes  Signed:  Lanell Carpenter  [facility] 09/12/2021, 2:46 PM

## 2021-09-12 NOTE — Plan of Care (Signed)
  Problem: Education: Goal: Knowledge of General Education information will improve Description: Including pain rating scale, medication(s)/side effects and non-pharmacologic comfort measures Outcome: Progressing   Problem: Health Behavior/Discharge Planning: Goal: Ability to manage health-related needs will improve Outcome: Progressing   Problem: Clinical Measurements: Goal: Respiratory complications will improve Outcome: Progressing   Problem: Clinical Measurements: Goal: Cardiovascular complication will be avoided Outcome: Progressing   Problem: Nutrition: Goal: Adequate nutrition will be maintained Outcome: Progressing   Problem: Pain Managment: Goal: General experience of comfort will improve Outcome: Progressing   Problem: Safety: Goal: Ability to remain free from injury will improve Outcome: Progressing

## 2021-09-13 LAB — PATHOLOGIST SMEAR REVIEW

## 2021-09-14 ENCOUNTER — Other Ambulatory Visit: Payer: Self-pay | Admitting: Internal Medicine

## 2021-09-14 ENCOUNTER — Telehealth: Payer: Self-pay

## 2021-09-14 MED ORDER — BENZONATATE 200 MG PO CAPS: ORAL_CAPSULE | ORAL | 1 refills | Status: DC

## 2021-09-14 MED ORDER — DEXAMETHASONE 4 MG PO TABS: ORAL_TABLET | ORAL | 0 refills | Status: DC

## 2021-09-14 MED ORDER — AZITHROMYCIN 250 MG PO TABS: ORAL_TABLET | ORAL | 1 refills | Status: DC

## 2021-09-14 MED ORDER — PROMETHAZINE-CODEINE 6.25-10 MG/5ML PO SYRP: ORAL_SOLUTION | ORAL | 1 refills | Status: DC

## 2021-09-14 NOTE — Telephone Encounter (Signed)
Called patient on 09/14/2021 , 11:21 AM in an attempt to reach the patient for a hospital follow up.   Admit date: 09/08/21 Discharge: 09/12/21   She does not have any questions or concerns about medications from the hospital admission. The patient's medications were reviewed over the phone, they were counseled to bring in all current medications to the hospital follow up visit.   I advised the patient to call if any questions or concerns arise about the hospital admission or medications    Home health was not started in the hospital.  All questions were answered and a follow up appointment was made.   Prior to Admission medications   Medication Sig Start Date End Date Taking? Authorizing Provider  acetaminophen (TYLENOL) 325 MG tablet Take 650 mg by mouth every 6 (six) hours as needed for mild pain, fever or headache.    [provider]  ALPRAZolam (XANAX) 1 MG tablet TAKE 1/2 - 1 TABLET 2 - 3 X /DAY ONLY IF NEEDED FOR PANIC ATTACK/ANXIETY ATTACK & LIMIT TO 5 DAYS /WEEK TO AVOID ADDICTION & DEMENTIA Patient taking differently: 0.5-1 mg 3 (three) times daily as needed for anxiety (panic attack). 05/10/21   [name], MD  bisoprolol-hydrochlorothiazide [facility]) 10-6.25 MG tablet Take  1 tablet  Daily for BP 04/13/21   [name], MD  buPROPion (WELLBUTRIN XL) 150 MG 24 hr tablet TAKE 1 TABLET BY MOUTH EVERY MORNING FOR MOOD OR DEPRESSION OR FOCUS OR CONCENTRATION Patient taking differently: Take 150 mg by mouth daily. 05/12/21   Liane Comber, NP  Cholecalciferol (VITAMIN D3) 250 MCG (10000 UT) TABS Take 5,000 Units by mouth in the morning.    [provider]  dexamethasone (DECADRON) 6 MG tablet Take 1 tablet (6 mg total) by mouth 2 (two) times daily for 3 days. 09/12/21 09/15/21  Pokhrel, Corrie Mckusick, MD  doxycycline (VIBRA-TABS) 100 MG tablet Take 1 tablet (100 mg total) by mouth 2 (two) times daily for 2 days. 09/12/21 09/14/21  Pokhrel, Corrie Mckusick, MD  ezetimibe (ZETIA) 10 MG  tablet TAKE ONE TABLET BY MOUTH DAILY FOR CHOLESTEROL Patient taking differently: Take 10 mg by mouth daily. 08/13/21   Liane Comber, NP  FLUoxetine (PROZAC) 20 MG capsule TAKE 1 CAPSULE BY MOUTH DAILY FOR MOOD Patient taking differently: Take 20 mg by mouth daily. 04/16/21   Liane Comber, NP  guaiFENesin-dextromethorphan (ROBITUSSIN DM) 100-10 MG/5ML syrup Take 10 mLs by mouth every 4 (four) hours as needed for cough. 09/12/21   Pokhrel, Corrie Mckusick, MD  hydrocortisone cream 1 % Apply 1 application topically 4 (four) times daily as needed for itching.    [provider]  levothyroxine (SYNTHROID) 50 MCG tablet Take 1 tablet (50 mcg total) by mouth daily. 05/26/21 05/26/22  Liane Comber, NP  Multiple Vitamins-Minerals (ZINC PO) Take 1 tablet by mouth daily.    [provider]  omeprazole (PRILOSEC) 20 MG capsule Take 20 mg by mouth 2 (two) times daily as needed (indigestion).    [provider]  Probiotic Product (PROBIOTIC PO) Take 1 capsule by mouth in the morning.  Patient not taking: Reported on 09/09/2021    [provider]  RESTASIS 0.05 % ophthalmic emulsion Place 1 drop into both eyes 2 (two) times daily as needed (for seasonal allergies/irritation).  08/19/19   [provider]  vitamin C (ASCORBIC ACID) 500 MG tablet Take 500 mg by mouth daily.    [provider]  zinc sulfate 220 (50 Zn) MG capsule Take 1 capsule (  220 mg total) by mouth daily. 09/12/21 10/12/21  Flora Lipps, MD

## 2021-09-16 ENCOUNTER — Other Ambulatory Visit: Payer: Self-pay | Admitting: Internal Medicine

## 2021-09-16 ENCOUNTER — Ambulatory Visit: Payer: Medicare Other | Admitting: Nurse Practitioner

## 2021-09-16 MED ORDER — PROMETHAZINE-CODEINE 6.25-10 MG/5ML PO SYRP: ORAL_SOLUTION | ORAL | 1 refills | Status: DC

## 2021-09-21 ENCOUNTER — Other Ambulatory Visit: Payer: Self-pay | Admitting: Internal Medicine

## 2021-09-21 MED ORDER — PROMETHAZINE-CODEINE 6.25-10 MG/5ML PO SYRP: ORAL_SOLUTION | ORAL | 1 refills | Status: DC

## 2021-09-22 ENCOUNTER — Ambulatory Visit (INDEPENDENT_AMBULATORY_CARE_PROVIDER_SITE_OTHER): Payer: Medicare Other | Admitting: Internal Medicine

## 2021-09-22 ENCOUNTER — Encounter: Payer: Self-pay | Admitting: Internal Medicine

## 2021-09-22 ENCOUNTER — Other Ambulatory Visit: Payer: Self-pay

## 2021-09-22 VITALS — BP 134/72 | HR 67 | Temp 97.8°F | Resp 17 | Ht 62.0 in | Wt 183.4 lb

## 2021-09-22 DIAGNOSIS — C9110 Chronic lymphocytic leukemia of B-cell type not having achieved remission: Secondary | ICD-10-CM

## 2021-09-22 DIAGNOSIS — U071 COVID-19: Secondary | ICD-10-CM

## 2021-09-22 DIAGNOSIS — G4733 Obstructive sleep apnea (adult) (pediatric): Secondary | ICD-10-CM

## 2021-09-22 DIAGNOSIS — J9601 Acute respiratory failure with hypoxia: Secondary | ICD-10-CM | POA: Diagnosis not present

## 2021-09-22 DIAGNOSIS — I10 Essential (primary) hypertension: Secondary | ICD-10-CM | POA: Diagnosis not present

## 2021-09-22 DIAGNOSIS — Z79899 Other long term (current) drug therapy: Secondary | ICD-10-CM

## 2021-09-22 DIAGNOSIS — J1282 Pneumonia due to coronavirus disease 2019: Secondary | ICD-10-CM

## 2021-09-22 DIAGNOSIS — E876 Hypokalemia: Secondary | ICD-10-CM

## 2021-09-22 DIAGNOSIS — E6609 Other obesity due to excess calories: Secondary | ICD-10-CM | POA: Diagnosis not present

## 2021-09-22 DIAGNOSIS — Z6837 Body mass index (BMI) 37.0-37.9, adult: Secondary | ICD-10-CM

## 2021-09-22 DIAGNOSIS — K219 Gastro-esophageal reflux disease without esophagitis: Secondary | ICD-10-CM

## 2021-09-22 MED ORDER — DEXAMETHASONE 4 MG PO TABS: ORAL_TABLET | ORAL | 0 refills | Status: DC

## 2021-09-22 NOTE — Progress Notes (Signed)
[place]     This very nice 75 y.o. MWFwas admitted to the hospital on  09/08/2021  and patient was discharged from the hospital on   09/12/2021 . The patient now presents for follow up for transition from recent hospitalization.  The day after discharge  our clinical staff contacted the patient to assure stability and schedule a follow up appointment. The discharge summary, medications and diagnostic test results were reviewed before meeting with the patient. The patient was admitted for:   Pneumonia due to COVID-19 virus  Acute respiratory failure with hypoxia  Essential hypertension CLL (chronic lymphocytic leukemia) Class 2 obesity due to excess calories with body mass index (BMI) of 37.0 to 37.9  Gastroesophageal reflux disease without esophagitis  OSA (obstructive sleep apnea) Hypokalemia                                      Patient was admitted with acute respiratory failure with hypoxia secondary to COVID-19 pneumonia and was treated with IV steroids and  Remdesivir.  She was initially on supplemental oxygen which has been weaned off as her condition improved.    She was discharged on a decadron taper and doxycycline for 2 days. Patient has been home about 9-10 day and is near end of her decadron taper.  She reports sinus HA & Congestion. Reports scant Reports O2 sat's about 96%. clear sputum.       Hospitalization discharge instructions and medications are reconciled with the patient.      Patient is also followed with Hypertension, Hyperlipidemia, Pre-Diabetes and Vitamin D Deficiency.      Patient is treated for HTN & BP has been controlled at home. Today's BP is at goal -  134/72. Patient has had no complaints of any cardiac type chest pain, palpitations, dyspnea/orthopnea/PND, dizziness, claudication, or dependent edema.     Hyperlipidemia is controlled with diet & meds. Patient denies myalgias or other med SE's. Last Lipids were not at goal:  Lab Results   Component Value Date   CHOL 191 05/13/2021   HDL 54 05/13/2021   LDLCALC 110 (H) 05/13/2021   TRIG 157 (H) 05/13/2021   CHOLHDL 3.5 05/13/2021      Also, the patient has history of PreDiabetes and has had no symptoms of reactive hypoglycemia, diabetic polys, paresthesias or visual blurring.  Last A1c was normal & at goal:  Lab Results  Component Value Date   HGBA1C 4.9 05/13/2021      Further, the patient also has history of Vitamin D Deficiency and supplements vitamin D without any suspected side-effects. Last vitamin D was  at goal:  Lab Results  Component Value Date   VD25OH 67 05/13/2021   Current Outpatient Medications on File Prior to Visit  Medication Sig   acetaminophen (TYLENOL) 325 MG tablet Take 650 mg every 6  hours as needed    ALPRAZolam (XANAX) 1 MG tablet Take 1/2 - 1 Tab 2 - 3 X /DAY Only if needed   benzonatate 200 MG capsule Take 1 perle 3 x / day to prevent cough   bisoprolol-hctz10-6.25 MG tablet Take  1 tablet  Daily for BP   buPROPionXL 150 MG  TAKE 1 TABLET EVERY MORNING    VITAMIN D 10,000 u Take 5,000 Units by mouth in the morning.   dexamethasone  4 MG tablet Completing 1 tab daily   ezetimibe 10 MG  tablet TAKE ONE TABLET BY DAILY    FLUoxetine 20 MG capsule TAKE 1 CAPSULE  DAILY    hydrocortisone cream 1 % Apply 4 times daily as needed for itching.   levothyroxine 50 MCG tablet Take 1 tablet daily.   Multiple Vitamins-Minerals  Take 1 tablet daily.   omeprazole 20 MG capsule Take 2  times daily as needed    promethazine-codeine  6.25-10 MG/5ML syrup Take 1 or 2 teaspoonful    4 x /day    as needed for Cough / Congestion   RESTASIS 0.05 % ophth Place 1 drop into both eyes 2 (two) times daily as needed (for seasonal allergies/irritation).    vitamin C 500 MG tablet Take daily.   zinc sulfate 220 (50 Zn) MG capsule Take 1 capsule daily.    Allergies  Allergen Reactions   Hyzaar [Losartan Potassium-Hctz] Swelling and Other (See Comments)    "messed  up my sodium counts"and swelled lips   Sudafed [Pseudoephedrine Hcl] Palpitations   Levaquin [Levofloxacin In D5w] Diarrhea and Nausea Only   Acyclovir And Related Other (See Comments)    Reaction not recalled   Biaxin [Clarithromycin]     GI Upset   Cantaloupe Extract Allergy Skin Test Other (See Comments)    Nasal congestion, headaches, and sneezing   Celexa [Citalopram] Other (See Comments)    Reaction not recalled   Flexeril [Cyclobenzaprine]     "zombie-like" feeling   Fosamax [Alendronate Sodium]     GI upset   Gabapentin     confusion   Iohexol Hives and Other (See Comments)    Patient stated she broke out in hive 20 yrs ago from IV contrast      Losartan Swelling and Other (See Comments)    Angioedema   Meloxicam     GI upset   Norvasc [Amlodipine] Swelling and Other (See Comments)    Ankles swell   Other Nausea Only and Other (See Comments)    Iceburg lettuce- "made me dreadfully nauseous" Raw mushrooms only-  "made me dreadfully nauseous" (cooked ones are tolerated)   Pseudoephedrine     Palpitations   Zoloft [Sertraline Hcl] Other (See Comments)    Has no emotions at all    PMHx:   Past Medical History:  Diagnosis Date   Anemia    Anxiety    Arthritis    CLL (chronic lymphocytic leukemia) ([place]) dx'd ~ 2015   Depression    GERD (gastroesophageal reflux disease)    Headache    "sinus headaches"   History of kidney stones    Hypertension    OSA (obstructive sleep apnea)    Osteoarthritis    Pneumonia    Prediabetes    Recurrent sinus infections 08/02/2012   Tubular adenoma of colon    Immunization History  Administered Date(s) Administered   Fluad Quad(high Dose 65+) 10/16/2020   Influenza, High Dose Seasonal PF 08/29/2017, 08/22/2018, 09/11/2019   Influenza,inj,Quad PF,6+ Mos 09/15/2014, 08/13/2015   Influenza-Unspecified 08/31/2013, 09/04/2014   PFIZER(Purple Top)SARS-COV-2 Vaccination 01/16/2020, 02/10/2020, 10/16/2020   Pneumococcal Conjugate-13  09/23/2015   Pneumococcal Polysaccharide-23 08/29/2017   Pneumococcal-Unspecified 12/28/2010   Td 07/05/2006, 08/22/2018   Past Surgical History:  Procedure Laterality Date   ABDOMINAL HYSTERECTOMY  1980's   "endometrosis"   APPENDECTOMY     BREAST SURGERY     CATARACT EXTRACTION, BILATERAL     CHOLECYSTECTOMY  1985   FUNCTIONAL ENDOSCOPIC SINUS SURGERY  1990's   "cause I kept having sinus infections"  immunoglobulin treatment  2017   LAPAROSCOPIC APPENDECTOMY N/A 06/04/2014   Procedure: APPENDECTOMY LAPAROSCOPIC;  Surgeon: Zenovia Jarred, MD;  Location: [facility];  Service: General;  Laterality: N/A;   LUMBAR LAMINECTOMY/DECOMPRESSION MICRODISCECTOMY Left 11/01/2017   Procedure: Left Lumbar Four-Five Extraforaminal Microdiscectomy;  Surgeon: Eustace Moore, MD;  Location: [facility];  Service: Neurosurgery;  Laterality: Left;   LYMPH NODE BIOPSY     "determined I had CLL"   SHOULDER ARTHROSCOPY Right    TEMPOROMANDIBULAR JOINT ARTHROPLASTY  1980's   TONSILLECTOMY AND ADENOIDECTOMY  1950's   FHx:    Reviewed / unchanged  SHx:    Reviewed / unchanged  Systems Review:  Constitutional: Denies fever, chills, wt changes, headaches, insomnia, fatigue, night sweats, change in appetite. Eyes: Denies redness, blurred vision, diplopia, discharge, itchy, watery eyes.  ENT: Denies discharge, congestion, post nasal drip, epistaxis, sore throat, earache, hearing loss, dental pain, tinnitus, vertigo, sinus pain, snoring.  CV: Denies chest pain, palpitations, irregular heartbeat, syncope, dyspnea, diaphoresis, orthopnea, PND, claudication or edema. Respiratory: denies cough, dyspnea, DOE, pleurisy, hoarseness, laryngitis, wheezing.  Gastrointestinal: Denies dysphagia, odynophagia, heartburn, reflux, water brash, abdominal pain or cramps, nausea, vomiting, bloating, diarrhea, constipation, hematemesis, melena, hematochezia  or hemorrhoids. Genitourinary: Denies dysuria, frequency, urgency, nocturia,  hesitancy, discharge, hematuria or flank pain. Musculoskeletal: Denies arthralgias, myalgias, stiffness, jt. swelling, pain, limping or strain/sprain.  Skin: Denies pruritus, rash, hives, warts, acne, eczema or change in skin lesion(s). Neuro: No weakness, tremor, incoordination, spasms, paresthesia or pain. Psychiatric: Denies confusion, memory loss or sensory loss. Endo: Denies change in weight, skin or hair change.  Heme/Lymph: No excessive bleeding, bruising or enlarged lymph nodes.  Physical Exam  BP 134/72   Pulse 67   Temp 97.8 F (36.6 C)   Resp 17   Ht 5\' 2"  (1.575 m)   Wt 183 lb 6.4 oz (83.2 kg)   SpO2 97%   BMI 33.54 kg/m   Appears well nourished, well groomed  and in no distress.  Eyes: PERRLA, EOMs, conjunctiva no swelling or erythema. Sinuses: No frontal/maxillary tenderness ENT/Mouth: EAC's clear, TM's nl w/o erythema, bulging. Nares clear w/o erythema, swelling, exudates. Oropharynx clear without erythema or exudates. Oral hygiene is good. Tongue normal, non obstructing. Hearing intact.  Neck: Supple. Thyroid nl. Car 2+/2+ without bruits, nodes or JVD. Chest: Respirations nl with BS clear & equal w/o rales, rhonchi, wheezing or stridor.  Cor: Heart sounds normal w/ regular rate and rhythm without sig. murmurs, gallops, clicks or rubs. Peripheral pulses normal and equal  without edema.  Abdomen: Soft & bowel sounds normal. Non-tender w/o guarding, rebound, hernias, masses or organomegaly.  Lymphatics: Unremarkable.  Musculoskeletal: Full ROM all peripheral extremities, joint stability, 5/5 strength and normal gait.  Skin: Warm, dry without exposed rashes, lesions or ecchymosis apparent.  Neuro: Cranial nerves intact, reflexes equal bilaterally. Sensory-motor testing grossly intact. Tendon reflexes grossly intact.  Pysch: Alert & oriented x 3.  Insight and judgement nl & appropriate. No ideations.      Discussed  regular exercise, BP monitoring, weight control to  achieve/maintain BMI less than 25 and discussed meds and SE's. Recommended labs  ( unable to draw labs - will rep[eat at [facility] assess and monitor clinical status with further disposition pending results of labs. Over 30 minutes of exam, counseling, chart review was performed.   Kirtland Bouchard, MD

## 2021-09-27 NOTE — Progress Notes (Signed)
ANNUAL WELLNESS VISIT  Assessment and Plan:   Encounter for Medicare annual wellness exam  Due Annually  Health Maintenance Reviewed  Healthy lifestyle reviewed and goals set   CLL (chronic lymphocytic leukemia) ([place]) Continue follow up Dr. Irene Limbo  Morbid obesity (BMI 37.95) - follow up 3 months for progress monitoring - increase veggies, decrease carbs - long discussion about weight loss, diet, and exercise  Atherosclerosis of aorta (HCC) Control blood pressure, cholesterol, glucose, increase exercise.   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 Irene Limbo  Chronic bronchitis, unspecified chronic bronchitis type ([place]) Monitor, continue to mask, socially distance and hand wash  Thrombocytopenia ([place]) -     COMPLETE METABOLIC PANEL WITH GFR - CBC  Pneumonia due to Covid 19 virus  Complete course of steroids  Monitor symptoms, if cough returns call the office  Depression, major, in remission ([place]) -   Continue Fluoxetine 20 mg QD Suggest CBT therapy for anxiety/counseling.   Immunocompromised ([place]) Monitor, continue to mask, socially distance and hand wash  Hypogammaglobulinemia (HCC) Monitor, continue to mask, socially distance and hand wash  Essential hypertension -     COMPLETE METABOLIC PANEL WITH GFR -     TSH - continue medications, DASH diet, exercise and monitor at home. Call if greater than 130/80.   OSA (obstructive sleep apnea) States it is mild, would prefer to work on weight loss than do OSA Try to avoid sleeping supine.   Mixed hyperlipidemia -     Lipid panel check lipids decrease fatty foods increase activity.   Medication management -     Magnesium  Abnormal glucose Discussed disease progression and risks Discussed diet/exercise, weight management and risk modification  Vitamin D deficiency -     VITAMIN D 25 Hydroxy (Vit-D Deficiency, Fractures)  Gastroesophageal reflux disease, unspecified whether esophagitis present Continue  Prilosec Follow up with GI Magnesium  Hypothyroidism, unspecified type TSH Continue Levothyroxine 50 mcg QD Please take your thyroid medication greater than 30 min before breakfast, separated by at least 4 hours  from antacids, calcium, iron, and multivitamins.    Continue diet and meds as discussed. Further disposition pending results of labs. OVER 30 minutes of exam, counseling, chart review, referral performed   Future Appointments  Date Time Provider [place]  11/16/2021  2:30 PM [facility] None  11/16/2021  3:00 PM Brunetta Genera, MD [facility] None  12/08/2021  4:00 PM [name], MD [facility] None  05/16/2022  2:00 PM [name], MD [facility] None  09/29/2022  4:00 PM Magda Bernheim, NP [facility] None    Plan:   During the course of the visit the patient was educated and counseled about appropriate screening and preventive services including:   Pneumococcal vaccine  Prevnar 13 Influenza vaccine Td vaccine Screening electrocardiogram Bone densitometry screening Colorectal cancer screening Diabetes screening Glaucoma screening Nutrition counseling  Advanced directives: requested    HPI 75 y.o. female  presents for over due 3 month follow up - follows up with hypertension, hyperlipidemia, prediabetes, Vitamin D, and complicated history of CLL.   She is currently on Wellbutrin and Xanax for anxiety.   She had covid and pneumonia and was hospitalized [facility]08/21[facility]10/9/[facility] course Acute respiratory failure with hypoxia secondary to COVID-19 pneumonia: Has improved at this time.  Patient received IV steroids, remdesivir during hospitalization.  Was initially on supplemental oxygen which has been weaned off.  Inflammatory markers improved.  Patient will continue oral dexamethasone for few more days on discharge.  CLL: Patient follows up with with Dr. Irene Limbo oncology as outpatient.  Last visit on August, 2022.  Patient was  diagnosed 7 years back with a lymph node biopsy.In the past patient was treated with IVIG to reduce infections.  She will follow-up with oncology at discharge.  She is having no further cough but does have no taste, brain fog, and fatigue.  She is not eating as well due to loss of taste.   BMI is Body mass index is 33.65 kg/m., she has been working on diet and exercise. Wt Readings from Last 3 Encounters:  09/29/21 184 lb (83.5 kg)  09/22/21 183 lb 6.4 oz (83.2 kg)  09/08/21 198 lb 6.6 oz (90 kg)     Patient was dx'd with CLL in Dec 2009 and been monitored by Dr. Irene Limbo. She could not tolerate the IVIG.  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  She is going to follow up with Dr. Melanee Spry. Lab Results  Component Value Date   WBC 108.8 (HH) 09/12/2021   HGB 10.3 (L) 09/12/2021   HCT 31.9 (L) 09/12/2021   MCV 100.0 09/12/2021   PLT 145 (L) 09/12/2021    She does not workout. She denies chest pain, shortness of breath, dizziness.  She is on cholesterol medication and denies myalgias. Her cholesterol is at goal. The cholesterol last visit was:   Lab Results  Component Value Date   CHOL 191 05/13/2021   HDL 54 05/13/2021   LDLCALC 110 (H) 05/13/2021   TRIG 157 (H) 05/13/2021   CHOLHDL 3.5 05/13/2021    She has been working on diet and exercise for prediabetes, and denies paresthesia of the feet, polydipsia, polyuria and visual disturbances. Last A1C in the office was:  Lab Results  Component Value Date   HGBA1C 4.9 05/13/2021   Patient is on Vitamin D supplement.   Lab Results  Component Value Date   VD25OH 67 05/13/2021     BMI is Body mass index is 33.65 kg/m., she is working on diet and exercise.. She has slight OSA, NOT on CPAP.  Wt Readings from Last 3 Encounters:  09/29/21 184 lb (83.5 kg)  09/22/21 183 lb 6.4 oz (83.2 kg)  09/08/21 198 lb 6.6 oz (90 kg)   Current Medications:  Current Outpatient Medications on File Prior to Visit  Medication Sig   acetaminophen (TYLENOL) 325  MG tablet Take 650 mg by mouth every 6 (six) hours as needed for mild pain, fever or headache.   ALPRAZolam (XANAX) 1 MG tablet TAKE 1/2 - 1 TABLET 2 - 3 X /DAY ONLY IF NEEDED FOR PANIC ATTACK/ANXIETY ATTACK & LIMIT TO 5 DAYS /WEEK TO AVOID ADDICTION & DEMENTIA (Patient taking differently: 0.5-1 mg 3 (three) times daily as needed for anxiety (panic attack).)   azithromycin (ZITHROMAX) 250 MG tablet Take 2 tablets with Food on  Day 1, then 1 tablet Daily with Food for Sinusitis / Bronchitis (Patient taking differently: Take 2 tablets with Food on  Day 1, then 1 tablet Daily with Food for Sinusitis / Bronchitis)   benzonatate (TESSALON) 200 MG capsule Take 1 perle 3 x / day to prevent cough   bisoprolol-hydrochlorothiazide (ZIAC) 10-6.25 MG tablet Take  1 tablet  Daily for BP   buPROPion (WELLBUTRIN XL) 150 MG 24 hr tablet TAKE 1 TABLET BY MOUTH EVERY MORNING FOR MOOD OR DEPRESSION OR FOCUS OR CONCENTRATION (Patient taking differently: Take 150 mg by mouth daily.)   Cholecalciferol (VITAMIN D3) 250 MCG (10000 UT) TABS  Take 5,000 Units by mouth in the morning.   dexamethasone (DECADRON) 4 MG tablet Take 1 tab 3 x day - 3 days, then 2 x day - 3 days, then 1 tab daily   ezetimibe (ZETIA) 10 MG tablet TAKE ONE TABLET BY MOUTH DAILY FOR CHOLESTEROL (Patient taking differently: Take 10 mg by mouth daily.)   FLUoxetine (PROZAC) 20 MG capsule TAKE 1 CAPSULE BY MOUTH DAILY FOR MOOD (Patient taking differently: Take 20 mg by mouth daily.)   hydrocortisone cream 1 % Apply 1 application topically 4 (four) times daily as needed for itching. (Patient not taking: Reported on 09/22/2021)   levothyroxine (SYNTHROID) 50 MCG tablet Take 1 tablet (50 mcg total) by mouth daily.   Multiple Vitamins-Minerals (ZINC PO) Take 1 tablet by mouth daily.   omeprazole (PRILOSEC) 20 MG capsule Take 20 mg by mouth 2 (two) times daily as needed (indigestion).   promethazine-codeine (PHENERGAN WITH CODEINE) 6.25-10 MG/5ML syrup Take 1 or  2 teaspoonful    4 x /day    as needed for Cough / Congestion   RESTASIS 0.05 % ophthalmic emulsion Place 1 drop into both eyes 2 (two) times daily as needed (for seasonal allergies/irritation).    vitamin C (ASCORBIC ACID) 500 MG tablet Take 500 mg by mouth daily.   zinc sulfate 220 (50 Zn) MG capsule Take 1 capsule (220 mg total) by mouth daily.   No current facility-administered medications on file prior to visit.   Medical History:  Past Medical History:  Diagnosis Date   Anemia    Anxiety    Arthritis    CLL (chronic lymphocytic leukemia) ([place]) dx'd ~ 2015   Depression    GERD (gastroesophageal reflux disease)    Headache    "sinus headaches"   History of kidney stones    Hypertension    OSA (obstructive sleep apnea)    Osteoarthritis    Pneumonia    Prediabetes    Recurrent sinus infections 08/02/2012   Tubular adenoma of colon    Allergies:  Allergies  Allergen Reactions   Hyzaar [Losartan Potassium-Hctz] Swelling and Other (See Comments)    "messed up my sodium counts"and swelled lips   Sudafed [Pseudoephedrine Hcl] Palpitations   Levaquin [Levofloxacin In D5w] Diarrhea and Nausea Only   Acyclovir And Related Other (See Comments)    Reaction not recalled   Biaxin [Clarithromycin]     GI Upset   Cantaloupe Extract Allergy Skin Test Other (See Comments)    Nasal congestion, headaches, and sneezing   Celexa [Citalopram] Other (See Comments)    Reaction not recalled   Flexeril [Cyclobenzaprine]     "zombie-like" feeling   Fosamax [Alendronate Sodium]     GI upset   Gabapentin     confusion   Iohexol Hives and Other (See Comments)    Patient stated she broke out in hive 20 yrs ago from IV contrast      Losartan Swelling and Other (See Comments)    Angioedema   Meloxicam     GI upset   Norvasc [Amlodipine] Swelling and Other (See Comments)    Ankles swell   Other Nausea Only and Other (See Comments)    Iceburg lettuce- "made me dreadfully nauseous" Raw  mushrooms only-  "made me dreadfully nauseous" (cooked ones are tolerated)   Pseudoephedrine     Palpitations   Zoloft [Sertraline Hcl] Other (See Comments)    Has no emotions at all     SURGICAL HISTORY She  has  a past surgical history that includes Cholecystectomy (1985); Tonsillectomy and adenoidectomy (1950's); Breast surgery; Lymph node biopsy; Temporomandibular joint arthroplasty (1980's); Functional endoscopic sinus surgery (1990's); Abdominal hysterectomy (1980's); laparoscopic appendectomy (N/A, 06/04/2014); immunoglobulin treatment (2017); Appendectomy; Shoulder arthroscopy (Right); Cataract extraction, bilateral; and Lumbar laminectomy/decompression microdiscectomy (Left, 11/01/2017). FAMILY HISTORY Her family history includes Arthritis in her brother; Heart attack in her brother and maternal grandmother; Hypertension in her maternal grandmother and mother; Parkinson's disease in her mother. SOCIAL HISTORY She  reports that she quit smoking about 52 years ago. Her smoking use included cigarettes. She has a 3.00 pack-year smoking history. She has never used smokeless tobacco. She reports current alcohol use of about 7.0 standard drinks per week. She reports that she does not use drugs.  Immunization History  Administered Date(s) Administered   Fluad Quad(high Dose 65+) 10/16/2020   Influenza, High Dose Seasonal PF 08/29/2017, 08/22/2018, 09/11/2019   Influenza,inj,Quad PF,6+ Mos 09/15/2014, 08/13/2015   Influenza-Unspecified 08/31/2013, 09/04/2014   PFIZER(Purple Top)SARS-COV-2 Vaccination 01/16/2020, 02/10/2020, 10/16/2020   Pneumococcal Conjugate-13 09/23/2015   Pneumococcal Polysaccharide-23 08/29/2017   Pneumococcal-Unspecified 12/28/2010   Td 07/05/2006, 08/22/2018   Health Maintenance  Topic Date Due   Zoster Vaccines- Shingrix (1 of 2) Never done   COVID-19 Vaccine (4 - Booster for Pfizer series) 12/11/2020   MAMMOGRAM  07/03/2021   INFLUENZA VACCINE  07/05/2021    COLONOSCOPY (Pts 45-39yrs Insurance coverage will need to be confirmed)  08/19/2025   TETANUS/TDAP  08/22/2028   Pneumonia Vaccine 40+ Years old  Completed   DEXA SCAN  Completed   Hepatitis C Screening  Completed   HPV VACCINES  Aged Out   Mammogram 07/03/20 Colonoscopy 08/19/20 Dr. Hilarie Fredrickson, due 2026 Dexa: 08/2018 normal Influenza:10/16/20 Pneumovax: 2018 Prevnar 13: 2016 Td: 2019 Covid: 3/3 [place]   Eye Doctor: Warden Fillers, MD Dentist:   Levell July OBJECTIVES: Physical activity:   Cardiac risk factors:   Depression/mood screen:   Depression screen [facility] 2/9 02/10/2021  Decreased Interest 0  Down, Depressed, Hopeless 0  PHQ - 2 Score 0  Altered sleeping -  Tired, decreased energy -  Change in appetite -  Feeling bad or failure about yourself  -  Trouble concentrating -  Moving slowly or fidgety/restless -  Suicidal thoughts -  PHQ-9 Score -  Difficult doing work/chores -  Some recent data might be hidden    ADLs:  In your present state of health, do you have any difficulty performing the following activities: 09/09/2021 02/10/2021  Hearing? N N  Vision? N N  Difficulty concentrating or making decisions? N N  Walking or climbing stairs? N N  Dressing or bathing? N N  Doing errands, shopping? N N  Some recent data might be hidden     Cognitive Testing  Alert? Yes  Normal Appearance?Yes  Oriented to person? Yes  Place? Yes   Time? Yes  Recall of three objects?  Yes  Can perform simple calculations? Yes  Displays appropriate judgment?Yes  Can read the correct time from a watch face?Yes  EOL planning: Does Patient Have a Medical Advance Directive?: No Would patient like information on creating a medical advance directive?: Yes (MAU/Ambulatory/Procedural Areas - Information given)  Review of Systems:  Review of Systems  Constitutional:  Positive for malaise/fatigue. Negative for chills, diaphoresis, fever and weight loss.  HENT:  Negative for congestion,  ear discharge, ear pain, hearing loss, nosebleeds, sinus pain, sore throat and tinnitus.        + TMJ  Eyes:  Negative for blurred vision, double vision, photophobia, pain and discharge.  Respiratory:  Negative for cough, hemoptysis, sputum production, wheezing and stridor.   Cardiovascular:  Negative for chest pain, palpitations, orthopnea, claudication, leg swelling and PND.  Gastrointestinal:  Negative for abdominal pain, blood in stool, constipation, diarrhea, heartburn, melena, nausea and vomiting.  Genitourinary:  Negative for dysuria, flank pain, frequency, hematuria and urgency.  Musculoskeletal:  Negative for back pain, falls, joint pain, myalgias and neck pain.  Skin: Negative.   Neurological:  Negative for dizziness, tingling, tremors, sensory change, speech change, focal weakness, seizures, loss of consciousness, weakness and headaches.       Loss of taste and smell with covid  Endo/Heme/Allergies:  Negative for environmental allergies and polydipsia. Does not bruise/bleed easily.  Psychiatric/Behavioral:  Positive for memory loss (brain fog after covid). Negative for depression, hallucinations, substance abuse and suicidal ideas. The patient is nervous/anxious and has insomnia.    Family history- Review and unchanged Social history- Review and unchanged Physical Exam: BP 136/74   Pulse (!) 54   Temp (!) 97.5 F (36.4 C)   Wt 184 lb (83.5 kg)   SpO2 99%   BMI 33.65 kg/m  Wt Readings from Last 3 Encounters:  09/29/21 184 lb (83.5 kg)  09/22/21 183 lb 6.4 oz (83.2 kg)  09/08/21 198 lb 6.6 oz (90 kg)   General Appearance: Well nourished, in no apparent distress, no diaphroesis Eyes: PERRLA, EOMs, conjunctiva no swelling or erythema Sinuses:  Frontal/maxillary tenderness ENT/Mouth: Ext aud canals clear, TMs without erythema, bulging. No erythema, swelling, or exudate on post pharynx.  Tonsils not swollen or erythematous. Hearing normal.  Neck: Supple, thyroid normal.   Respiratory: Respiratory effort normal, BS equal bilaterally without rales, rhonchi, wheezing or stridor.  Cardio: RRR with no MRGs. Brisk peripheral pulses with mild edema.  Abdomen: Soft, + BS, obese, diffusely overly tender to palpation,  without rebound, hernias, masses. Lymphatics: Non tender without lymphadenopathy.  Musculoskeletal: Full ROM, 5/5 strength, Normal gait, Strength is normal and symmetric in arms. Skin:  Warm, dry without rashes, lesions, ecchymosis.  Neuro: Cranial nerves intact. Normal muscle tone, no cerebellar symptoms. Psych: Awake and oriented X 3, normal affect, appears anxious, Insight and Judgment appropriate.   Medicare Attestation I have personally reviewed: The patient's medical and social history Their use of alcohol, tobacco or illicit drugs Their current medications and supplements The patient's functional ability including ADLs,fall risks, home safety risks, cognitive, and hearing and visual impairment Diet and physical activities Evidence for depression or mood disorders  The patient's weight, height, BMI, and visual acuity have been recorded in the chart.  I have made referrals, counseling, and provided education to the patient based on review of the above and I have provided the patient with a written personalized care plan for preventive services.     Magda Bernheim, NP 4:47 PM [facility]

## 2021-09-29 ENCOUNTER — Other Ambulatory Visit: Payer: Self-pay

## 2021-09-29 ENCOUNTER — Ambulatory Visit (INDEPENDENT_AMBULATORY_CARE_PROVIDER_SITE_OTHER): Payer: Medicare Other | Admitting: Nurse Practitioner

## 2021-09-29 ENCOUNTER — Encounter: Payer: Self-pay | Admitting: Nurse Practitioner

## 2021-09-29 VITALS — BP 136/74 | HR 54 | Temp 97.5°F | Wt 184.0 lb

## 2021-09-29 DIAGNOSIS — I700 Atherosclerosis of aorta: Secondary | ICD-10-CM

## 2021-09-29 DIAGNOSIS — D696 Thrombocytopenia, unspecified: Secondary | ICD-10-CM | POA: Diagnosis not present

## 2021-09-29 DIAGNOSIS — C9110 Chronic lymphocytic leukemia of B-cell type not having achieved remission: Secondary | ICD-10-CM

## 2021-09-29 DIAGNOSIS — R6889 Other general symptoms and signs: Secondary | ICD-10-CM | POA: Diagnosis not present

## 2021-09-29 DIAGNOSIS — D801 Nonfamilial hypogammaglobulinemia: Secondary | ICD-10-CM

## 2021-09-29 DIAGNOSIS — J42 Unspecified chronic bronchitis: Secondary | ICD-10-CM

## 2021-09-29 DIAGNOSIS — K219 Gastro-esophageal reflux disease without esophagitis: Secondary | ICD-10-CM

## 2021-09-29 DIAGNOSIS — U071 COVID-19: Secondary | ICD-10-CM

## 2021-09-29 DIAGNOSIS — G4733 Obstructive sleep apnea (adult) (pediatric): Secondary | ICD-10-CM

## 2021-09-29 DIAGNOSIS — E782 Mixed hyperlipidemia: Secondary | ICD-10-CM

## 2021-09-29 DIAGNOSIS — E039 Hypothyroidism, unspecified: Secondary | ICD-10-CM

## 2021-09-29 DIAGNOSIS — J1282 Pneumonia due to coronavirus disease 2019: Secondary | ICD-10-CM

## 2021-09-29 DIAGNOSIS — F325 Major depressive disorder, single episode, in full remission: Secondary | ICD-10-CM

## 2021-09-29 DIAGNOSIS — Z0001 Encounter for general adult medical examination with abnormal findings: Secondary | ICD-10-CM | POA: Diagnosis not present

## 2021-09-29 DIAGNOSIS — E559 Vitamin D deficiency, unspecified: Secondary | ICD-10-CM | POA: Diagnosis not present

## 2021-09-29 DIAGNOSIS — Z0000 Encounter for general adult medical examination without abnormal findings: Secondary | ICD-10-CM

## 2021-09-29 DIAGNOSIS — D849 Immunodeficiency, unspecified: Secondary | ICD-10-CM

## 2021-09-29 DIAGNOSIS — R7309 Other abnormal glucose: Secondary | ICD-10-CM

## 2021-09-29 DIAGNOSIS — I10 Essential (primary) hypertension: Secondary | ICD-10-CM

## 2021-09-29 NOTE — Patient Instructions (Signed)

## 2021-09-30 ENCOUNTER — Encounter: Payer: Self-pay | Admitting: Hematology

## 2021-09-30 ENCOUNTER — Other Ambulatory Visit: Payer: Self-pay | Admitting: Nurse Practitioner

## 2021-09-30 ENCOUNTER — Telehealth: Payer: Self-pay | Admitting: Nurse Practitioner

## 2021-09-30 DIAGNOSIS — E871 Hypo-osmolality and hyponatremia: Secondary | ICD-10-CM

## 2021-09-30 LAB — CBC WITH DIFFERENTIAL/PLATELET
Absolute Monocytes: 252 cells/uL (ref 200–950)
Basophils Absolute: 0 cells/uL (ref 0–200)
Basophils Relative: 0 %
Eosinophils Absolute: 0 cells/uL — ABNORMAL LOW (ref 15–500)
Eosinophils Relative: 0 %
HCT: 34 % — ABNORMAL LOW (ref 35.0–45.0)
Hemoglobin: 10 g/dL — ABNORMAL LOW (ref 11.7–15.5)
Lymphs Abs: 238424 cells/uL — ABNORMAL HIGH (ref 850–3900)
MCH: 29.4 pg (ref 27.0–33.0)
MCHC: 29.4 g/dL — ABNORMAL LOW (ref 32.0–36.0)
MCV: 100 fL (ref 80.0–100.0)
MPV: 9.6 fL (ref 7.5–12.5)
Monocytes Relative: 0.1 %
Neutro Abs: 13624 cells/uL — ABNORMAL HIGH (ref 1500–7800)
Neutrophils Relative %: 5.4 %
Platelets: 89 10*3/uL — ABNORMAL LOW (ref 140–400)
RBC: 3.4 10*6/uL — ABNORMAL LOW (ref 3.80–5.10)
Total Lymphocyte: 94.5 %
WBC: 252.3 10*3/uL — ABNORMAL HIGH (ref 3.8–10.8)

## 2021-09-30 LAB — COMPLETE METABOLIC PANEL WITH GFR
AG Ratio: 2.3 (calc) (ref 1.0–2.5)
ALT: 21 U/L (ref 6–29)
AST: 18 U/L (ref 10–35)
Albumin: 4.1 g/dL (ref 3.6–5.1)
Alkaline phosphatase (APISO): 44 U/L (ref 37–153)
BUN: 17 mg/dL (ref 7–25)
CO2: 21 mmol/L (ref 20–32)
Calcium: 9.1 mg/dL (ref 8.6–10.4)
Chloride: 86 mmol/L — ABNORMAL LOW (ref 98–110)
Creat: 0.8 mg/dL (ref 0.60–1.00)
Globulin: 1.8 g/dL (calc) — ABNORMAL LOW (ref 1.9–3.7)
Glucose, Bld: 112 mg/dL — ABNORMAL HIGH (ref 65–99)
Potassium: 3.5 mmol/L (ref 3.5–5.3)
Sodium: 120 mmol/L — CL (ref 135–146)
Total Bilirubin: 1.2 mg/dL (ref 0.2–1.2)
Total Protein: 5.9 g/dL — ABNORMAL LOW (ref 6.1–8.1)
eGFR: 77 mL/min/{1.73_m2} (ref >=60)

## 2021-09-30 LAB — LIPID PANEL
Cholesterol: 144 mg/dL (ref <200)
HDL: 73 mg/dL (ref >=50)
LDL Cholesterol (Calc): 55 mg/dL (calc)
Non-HDL Cholesterol (Calc): 71 mg/dL (calc) (ref <130)
Total CHOL/HDL Ratio: 2 (calc) (ref <5.0)
Triglycerides: 80 mg/dL (ref <150)

## 2021-09-30 LAB — TSH: TSH: 0.28 mIU/L — ABNORMAL LOW (ref 0.40–4.50)

## 2021-09-30 LAB — VITAMIN D 25 HYDROXY (VIT D DEFICIENCY, FRACTURES): Vit D, 25-Hydroxy: 69 ng/mL (ref 30–100)

## 2021-09-30 LAB — MAGNESIUM: Magnesium: 1.5 mg/dL (ref 1.5–2.5)

## 2021-09-30 MED ORDER — BISOPROLOL FUMARATE 10 MG PO TABS: 10.0000 mg | ORAL_TABLET | Freq: Every day | ORAL | 0 refills | Status: DC

## 2021-09-30 NOTE — Telephone Encounter (Signed)
Spoke with patient again this afternoon and was advised per myself and Dr. Irene Limbo is was recommended she go to the ER for hyponatremia of 120.  Pt aware can cause seizures or loss of consciousness .  She does not want to go to ER.  Will increase sodium in diet and return in 1 week to have labs checked. Husband is monitoring patient at home.

## 2021-10-04 ENCOUNTER — Emergency Department (HOSPITAL_BASED_OUTPATIENT_CLINIC_OR_DEPARTMENT_OTHER): Payer: Medicare Other

## 2021-10-04 ENCOUNTER — Encounter (HOSPITAL_BASED_OUTPATIENT_CLINIC_OR_DEPARTMENT_OTHER): Payer: Self-pay | Admitting: Emergency Medicine

## 2021-10-04 ENCOUNTER — Emergency Department (HOSPITAL_BASED_OUTPATIENT_CLINIC_OR_DEPARTMENT_OTHER)
Admission: EM | Admit: 2021-10-04 | Discharge: 2021-10-04 | Disposition: A | Discharge status: Home / Self Care | Payer: Medicare Other | Attending: Emergency Medicine | Admitting: Emergency Medicine

## 2021-10-04 ENCOUNTER — Other Ambulatory Visit: Payer: Self-pay

## 2021-10-04 DIAGNOSIS — I10 Essential (primary) hypertension: Secondary | ICD-10-CM | POA: Insufficient documentation

## 2021-10-04 DIAGNOSIS — R4182 Altered mental status, unspecified: Secondary | ICD-10-CM | POA: Diagnosis not present

## 2021-10-04 DIAGNOSIS — Z87891 Personal history of nicotine dependence: Secondary | ICD-10-CM | POA: Insufficient documentation

## 2021-10-04 DIAGNOSIS — Z8616 Personal history of COVID-19: Secondary | ICD-10-CM | POA: Insufficient documentation

## 2021-10-04 DIAGNOSIS — I499 Cardiac arrhythmia, unspecified: Secondary | ICD-10-CM | POA: Diagnosis not present

## 2021-10-04 DIAGNOSIS — C9110 Chronic lymphocytic leukemia of B-cell type not having achieved remission: Secondary | ICD-10-CM | POA: Diagnosis not present

## 2021-10-04 DIAGNOSIS — R404 Transient alteration of awareness: Secondary | ICD-10-CM | POA: Diagnosis not present

## 2021-10-04 DIAGNOSIS — R251 Tremor, unspecified: Secondary | ICD-10-CM | POA: Diagnosis not present

## 2021-10-04 DIAGNOSIS — R531 Weakness: Secondary | ICD-10-CM | POA: Insufficient documentation

## 2021-10-04 DIAGNOSIS — R410 Disorientation, unspecified: Secondary | ICD-10-CM | POA: Diagnosis not present

## 2021-10-04 LAB — COMPREHENSIVE METABOLIC PANEL
ALT: 27 U/L (ref 0–44)
AST: 19 U/L (ref 15–41)
Albumin: 3.9 g/dL (ref 3.5–5.0)
Alkaline Phosphatase: 38 U/L (ref 38–126)
Anion gap: 13 (ref 5–15)
BUN: 10 mg/dL (ref 8–23)
CO2: 24 mmol/L (ref 22–32)
Calcium: 8.8 mg/dL — ABNORMAL LOW (ref 8.9–10.3)
Chloride: 96 mmol/L — ABNORMAL LOW (ref 98–111)
Creatinine, Ser: 0.72 mg/dL (ref 0.44–1.00)
GFR, Estimated: >60 mL/min (ref >60)
Glucose, Bld: 97 mg/dL (ref 70–99)
Potassium: 3.2 mmol/L — ABNORMAL LOW (ref 3.5–5.1)
Sodium: 133 mmol/L — ABNORMAL LOW (ref 135–145)
Total Bilirubin: 0.8 mg/dL (ref 0.3–1.2)
Total Protein: 5.8 g/dL — ABNORMAL LOW (ref 6.5–8.1)

## 2021-10-04 LAB — CBC WITH DIFFERENTIAL/PLATELET
Abs Immature Granulocytes: 0 K/uL (ref 0.00–0.07)
Basophils Absolute: 0 K/uL (ref 0.0–0.1)
Basophils Relative: 0 %
Eosinophils Absolute: 0 K/uL (ref 0.0–0.5)
Eosinophils Relative: 0 %
HCT: 34.6 % — ABNORMAL LOW (ref 36.0–46.0)
Hemoglobin: 10.4 g/dL — ABNORMAL LOW (ref 12.0–15.0)
Lymphocytes Relative: 94 %
Lymphs Abs: 193.5 K/uL — ABNORMAL HIGH (ref 0.7–4.0)
MCH: 29.5 pg (ref 26.0–34.0)
MCHC: 30.1 g/dL (ref 30.0–36.0)
MCV: 98.3 fL (ref 80.0–100.0)
Monocytes Absolute: 2.1 K/uL — ABNORMAL HIGH (ref 0.1–1.0)
Monocytes Relative: 1 %
Neutro Abs: 10.3 K/uL — ABNORMAL HIGH (ref 1.7–7.7)
Neutrophils Relative %: 5 %
Platelets: 95 K/uL — ABNORMAL LOW (ref 150–400)
RBC: 3.52 MIL/uL — ABNORMAL LOW (ref 3.87–5.11)
RDW: 15.9 % — ABNORMAL HIGH (ref 11.5–15.5)
WBC Morphology: ABNORMAL
WBC: 205.9 K/uL (ref 4.0–10.5)
nRBC: 0 % (ref 0.0–0.2)

## 2021-10-04 LAB — URINALYSIS, ROUTINE W REFLEX MICROSCOPIC
Bilirubin Urine: NEGATIVE
Glucose, UA: NEGATIVE mg/dL
Hgb urine dipstick: NEGATIVE
Ketones, ur: NEGATIVE mg/dL
Leukocytes,Ua: NEGATIVE
Nitrite: NEGATIVE
Protein, ur: NEGATIVE mg/dL
Specific Gravity, Urine: 1.01 (ref 1.005–1.030)
pH: 7 (ref 5.0–8.0)

## 2021-10-04 LAB — CBG MONITORING, ED: Glucose-Capillary: 90 mg/dL (ref 70–99)

## 2021-10-04 NOTE — ED Notes (Signed)
Pt's CBG result was 90. Informed [place] - RN.

## 2021-10-04 NOTE — ED Provider Notes (Signed)
[facility] EMERGENCY DEPT Provider Note   CSN: [id number] Arrival date & time: 10/04/21  1514     History Chief Complaint  Patient presents with   Weakness    Rebecca Bond is a 75 y.o. female.  She was recently admitted earlier in the month for COVID.  She had improved from that.  She presents by ambulance from home for confusion.  She lives with her husband.  She does not know why she is here.  She denies any medical complaints.  Level 5 caveat secondary to altered mental status.  The history is provided by the patient and the EMS personnel.  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   Depression    GERD (gastroesophageal reflux disease)    Headache    "sinus headaches"   History of kidney stones    Hypertension    OSA (obstructive sleep apnea)    Osteoarthritis    Pneumonia    Prediabetes    Recurrent sinus infections 08/02/2012   Tubular adenoma of colon     Patient Active Problem List   Diagnosis Date Noted   Pneumonia due to COVID-19 virus 09/08/2021   Norovirus 04/10/2020   Acute gastroenteritis 04/08/2020   AKI (acute kidney injury) ([place]) 04/08/2020   Anxiety with depression 04/08/2020   Hyperbilirubinemia 04/08/2020   Hypocalcemia 04/08/2020   Hypomagnesemia 04/08/2020   Chronic bronchitis, unspecified chronic bronchitis type ([place]) 03/12/2020   Atherosclerosis of aorta ([place]) 03/12/2020   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   Counseling regarding advance care  planning and goals of care 12/16/2018   S/P lumbar laminectomy 11/01/2017   Thrombocytopenia ([place]) 05/02/2016   Immunocompromised ([place]) 10/10/2015   BMI 37.85,   adult 09/23/2015   Environmental and seasonal allergies 08/15/2015   Mixed hyperlipidemia 06/01/2015   Medication management 06/01/2015   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   Vitamin D deficiency 09/30/2014   HTN (hypertension) 01/08/2014   Abnormal glucose    Depression, major, in remission ([place])    GERD (gastroesophageal reflux disease)    OSA (obstructive sleep apnea)    Angioedema secondary to ACE/ARB 12/25/2013   Class 2 obesity due to excess calories with body mass index (BMI) of 37.0 to 37.9 in adult 12/17/2013   Hepatitis A 08/02/2012   Recurrent sinus infections 08/02/2012   CLL (chronic lymphocytic leukemia) ([place]) 11/22/2011    Past Surgical History:  Procedure Laterality Date   ABDOMINAL HYSTERECTOMY  1980's   "endometrosis"   APPENDECTOMY     BREAST SURGERY     CATARACT EXTRACTION, BILATERAL     CHOLECYSTECTOMY  1985   FUNCTIONAL ENDOSCOPIC SINUS SURGERY  1990's   "cause I kept having sinus infections"   immunoglobulin treatment  2017   LAPAROSCOPIC APPENDECTOMY N/A 06/04/2014   Procedure: APPENDECTOMY LAPAROSCOPIC;  Surgeon: Zenovia Jarred, MD;  Location: [facility];  Service: General;  Laterality: N/A;   LUMBAR LAMINECTOMY/DECOMPRESSION MICRODISCECTOMY Left 11/01/2017   Procedure: Left Lumbar Four-Five Extraforaminal Microdiscectomy;  Surgeon: Ronnald Ramp,  Camelia Eng, MD;  Location: [facility];  Service: Neurosurgery;  Laterality: Left;   LYMPH NODE BIOPSY     "determined I had CLL"   SHOULDER ARTHROSCOPY Right    TEMPOROMANDIBULAR JOINT ARTHROPLASTY  1980's   TONSILLECTOMY AND ADENOIDECTOMY  1950's     OB History   No obstetric history on file.     Family History  Problem Relation Age of Onset   Parkinson's disease Mother    Hypertension Mother    Hypertension Maternal Grandmother    Heart attack  Maternal Grandmother        Mild   Heart attack Brother    Arthritis Brother     Social History   Tobacco Use   Smoking status: Former    Packs/day: 0.75    Years: 4.00    Pack years: 3.00    Types: Cigarettes    Quit date: 12/05/1968    Years since quitting: 52.8   Smokeless tobacco: Never  Vaping Use   Vaping Use: Never used  Substance Use Topics   Alcohol use: Yes    Alcohol/week: 7.0 standard drinks    Types: 7 Glasses of wine per week   Drug use: No    Home Medications Prior to Admission medications   Medication Sig Start Date End Date Taking? Authorizing Provider  acetaminophen (TYLENOL) 325 MG tablet Take 650 mg by mouth every 6 (six) hours as needed for mild pain, fever or headache.    [provider]  ALPRAZolam (XANAX) 1 MG tablet TAKE 1/2 - 1 TABLET 2 - 3 X /DAY ONLY IF NEEDED FOR PANIC ATTACK/ANXIETY ATTACK & LIMIT TO 5 DAYS /WEEK TO AVOID ADDICTION & DEMENTIA Patient taking differently: 0.5-1 mg 3 (three) times daily as needed for anxiety (panic attack). 05/10/21   [name], MD  bisoprolol (ZEBETA) 10 MG tablet Take 1 tablet (10 mg total) by mouth daily. 09/30/21   Magda Bernheim, NP  buPROPion (WELLBUTRIN XL) 150 MG 24 hr tablet TAKE 1 TABLET BY MOUTH EVERY MORNING FOR MOOD OR DEPRESSION OR FOCUS OR CONCENTRATION Patient taking differently: Take 150 mg by mouth daily. 05/12/21   Liane Comber, NP  Cholecalciferol (VITAMIN D3) 250 MCG (10000 UT) TABS Take 5,000 Units by mouth in the morning.    [provider]  dexamethasone (DECADRON) 4 MG tablet Take 1 tab 3 x day - 3 days, then 2 x day - 3 days, then 1 tab daily 09/22/21   [name], MD  ezetimibe (ZETIA) 10 MG tablet TAKE ONE TABLET BY MOUTH DAILY FOR CHOLESTEROL Patient taking differently: Take 10 mg by mouth daily. 08/13/21   Liane Comber, NP  FLUoxetine (PROZAC) 20 MG capsule TAKE 1 CAPSULE BY MOUTH DAILY FOR MOOD Patient taking differently: Take 20 mg by mouth daily. 04/16/21    Liane Comber, NP  levothyroxine (SYNTHROID) 50 MCG tablet Take 1 tablet (50 mcg total) by mouth daily. 05/26/21 05/26/22  Liane Comber, NP  Multiple Vitamins-Minerals (ZINC PO) Take 1 tablet by mouth daily.    [provider]  omeprazole (PRILOSEC) 20 MG capsule Take 20 mg by mouth 2 (two) times daily as needed (indigestion).    [provider]  promethazine-codeine (PHENERGAN WITH CODEINE) 6.25-10 MG/5ML syrup Take 1 or 2 teaspoonful    4 x /day    as needed for Cough / Congestion 09/21/21   [name], MD  RESTASIS 0.05 % ophthalmic emulsion Place 1 drop into both eyes 2 (two) times daily as needed (  for seasonal allergies/irritation).  08/19/19   [provider]  vitamin C (ASCORBIC ACID) 500 MG tablet Take 500 mg by mouth daily.    [provider]  zinc sulfate 220 (50 Zn) MG capsule Take 1 capsule (220 mg total) by mouth daily. 09/12/21 10/12/21  Pokhrel, Corrie Mckusick, MD    Allergies    Hyzaar [losartan potassium-hctz], Sudafed [pseudoephedrine hcl], Levaquin [levofloxacin in d5w], Acyclovir and related, Biaxin [clarithromycin], Cantaloupe extract allergy skin test, Celexa [citalopram], Flexeril [cyclobenzaprine], Fosamax [alendronate sodium], Gabapentin, Iohexol, Losartan, Meloxicam, Norvasc [amlodipine], Other, Pseudoephedrine, and Zoloft [sertraline hcl]  Review of Systems   Review of Systems  Unable to perform ROS: Mental status change  Constitutional:  Negative for fever.  Respiratory:  Negative for cough and shortness of breath.   Cardiovascular:  Negative for chest pain.  Gastrointestinal:  Negative for abdominal pain and diarrhea.  Genitourinary:  Negative for frequency.  Neurological:  Positive for weakness. Negative for headaches.   Physical Exam Updated Vital Signs BP (!) 199/87 (BP Location: Left Arm)   Pulse 65   Temp 98.3 F (36.8 C)   Resp 18   SpO2 100%   Physical Exam Vitals and nursing note reviewed.  Constitutional:       General: She is not in acute distress.    Appearance: Normal appearance. She is well-developed.  HENT:     Head: Normocephalic and atraumatic.  Eyes:     Conjunctiva/sclera: Conjunctivae normal.  Cardiovascular:     Rate and Rhythm: Normal rate and regular rhythm.     Heart sounds: No murmur heard. Pulmonary:     Effort: Pulmonary effort is normal. No respiratory distress.     Breath sounds: Normal breath sounds.  Abdominal:     Palpations: Abdomen is soft.     Tenderness: There is no abdominal tenderness.  Musculoskeletal:     Cervical back: Neck supple.  Skin:    General: Skin is warm and dry.  Neurological:     General: No focal deficit present.     Mental Status: She is alert. She is disoriented.     Cranial Nerves: No cranial nerve deficit.     Sensory: No sensory deficit.     Motor: No weakness.     Comments: She is oriented to self and place.  Not oriented to time and situation.  No focal deficits.    ED Results / Procedures / Treatments   Labs (all labs ordered are listed, but only abnormal results are displayed) Labs Reviewed  COMPREHENSIVE METABOLIC PANEL - Abnormal; Notable for the following components:      Result Value   Sodium 133 (*)    Potassium 3.2 (*)    Chloride 96 (*)    Calcium 8.8 (*)    Total Protein 5.8 (*)    All other components within normal limits  CBC WITH DIFFERENTIAL/PLATELET - Abnormal; Notable for the following components:   WBC 205.9 (*)    RBC 3.52 (*)    Hemoglobin 10.4 (*)    HCT 34.6 (*)    RDW 15.9 (*)    Platelets 95 (*)    Neutro Abs 10.3 (*)    Lymphs Abs 193.5 (*)    Monocytes Absolute 2.1 (*)    All other components within normal limits  URINE CULTURE  URINALYSIS, ROUTINE W REFLEX MICROSCOPIC  CBG MONITORING, ED    EKG EKG Interpretation  Date/Time:  Monday October 04 2021 15:51:12 EDT Ventricular Rate:  62 PR Interval:  QRS Duration: 102 QT Interval:  440 QTC Calculation: 447 R Axis:   8 Text  Interpretation: Atrial fibrillation LVH with secondary repolarization abnormality No significant change since prior 10/22 Confirmed by Aletta Edouard [phone]) on 10/04/2021 4:02:37 PM  Radiology CT Head Wo Contrast  Result Date: 10/04/2021 CLINICAL DATA:  Mental status change. EXAM: CT HEAD WITHOUT CONTRAST TECHNIQUE: Contiguous axial images were obtained from the base of the skull through the vertex without intravenous contrast. COMPARISON:  None. FINDINGS: Brain: No evidence of acute infarction, hemorrhage, hydrocephalus, extra-axial collection or mass lesion/mass effect. There is mild diffuse low-attenuation within the subcortical and periventricular white matter compatible with chronic microvascular disease. Prominence of sulci and ventricles compatible with brain atrophy Vascular: No hyperdense vessel or unexpected calcification. Skull: Normal. Negative for fracture or focal lesion. Sinuses/Orbits: No acute finding. Other: None. IMPRESSION: 1. No acute intracranial abnormalities. 2. Chronic small vessel ischemic change and brain atrophy. Electronically Signed   By: Kerby Moors M.D.   On: 10/04/2021 18:02   DG Chest Port 1 View  Result Date: 10/04/2021 CLINICAL DATA:  Weakness, lethargy, and confusion for 2 weeks, had COVID-19 pneumonia in early October 2022. History hypertension, GERD, CLL EXAM: PORTABLE CHEST 1 VIEW COMPARISON:  Portable exam 1605 hours compared to 09/08/2021 FINDINGS: Upper normal heart size. Mediastinal contours and pulmonary vascularity normal. Bronchitic changes without pulmonary infiltrate, pleural effusion, or pneumothorax. Bones demineralized with suspected small calcified loose bodies in the subcoracoid recess at the LEFT shoulder. IMPRESSION: Bronchitic changes without infiltrate. Electronically Signed   By: Lavonia Dana M.D.   On: 10/04/2021 16:12    Procedures Procedures   Medications Ordered in ED Medications - No data to display  ED Course  I have reviewed  the triage vital signs and the nursing notes.  Pertinent labs & imaging results that were available during my care of the patient were reviewed by me and considered in my medical decision making (see chart for details).  Clinical Course as of 10/05/21 1012  Mon Oct 04, 2021  1753 Patient's husband is here now and he is able to fit a little bit more detail.  He said she had COVID about 3 weeks ago.  PCP saw her recently found her sodium to be low and recommended that she be admitted.  She declined and has been taking salt tablets.  Today she was very tremulous and wanted to call the ambulance.  He also thought she was confused today. [MB]  7062 Discussed with [facility] hospitalist Dr. Annamaria Boots who was not sure that this was primarily an oncologic etiology for her confusion.  She did recommend hydration and she would message patient oncologist Dr. Irene Limbo. [MB]  3762 Patient's work-up has been fairly unremarkable other than the elevated white count.  Went back and reviewed this with her and now she is oriented and feels well and wants to go home.  Husband also feels she is back to baseline.  I expressed my concerns for this altered mental status and that she would benefit from hospitalization so we can get an MRI and continue work-up.  She is declining this at this time.  She said she will follow-up with her treating providers.  Return instructions discussed with both her and her husband. [MB]    Clinical Course User Index [MB] Hayden Rasmussen, MD   MDM Rules/Calculators/A&P  Karis Rilling Kuipers was evaluated in Emergency Department on 10/04/2021 for the symptoms described in the history of present illness. She was evaluated in the context of the global COVID-19 pandemic, which necessitated consideration that the patient might be at risk for infection with the SARS-CoV-2 virus that causes COVID-19. Institutional protocols and algorithms that pertain to the evaluation of patients at risk for  COVID-19 are in a state of rapid change based on information released by regulatory bodies including the CDC and federal and state organizations. These policies and algorithms were followed during the patient's care in the ED.  This patient complains of confusion, tremor; this involves an extensive number of treatment Options and is a complaint that carries with it a high risk of complications and Morbidity. The differential includes sepsis, Sirs, pneumonia, COVID, metabolic derangement, dehydration  I ordered, reviewed and interpreted labs, which included CBC with markedly elevated white count, stable platelets and hemoglobin, chemistries with mildly low sodium and potassium, normal renal function, urinalysis without clear signs of infection I ordered imaging studies which included chest x-ray and head CT and I independently    visualized and interpreted imaging which showed bronchitic changes no acute infiltrates, no acute CNS findings Additional history obtained from patient's husband Previous records obtained and reviewed in epic including prior [facility] admission and outpatient follow-up with PCP  After the interventions stated above, I reevaluated the patient and found patient to be more oriented and appropriate.  Tremor is also improved.  I recommended admission to the hospital for further work-up.  Patient declines this and is asking to be discharged.  Husband is supportive of this decision.  Recommended close follow-up with PCP.  Return instructions discussed.  Of note I did review patient's blood work with Dr. Burr Medico oncology and she did not feel like the leukocytosis was the cause of the patient's symptoms currently.  Recommended close follow-up with patient's primary oncologist.   Final Clinical Impression(s) / ED Diagnoses Final diagnoses:  Tremor  Transient confusion  CLL (chronic lymphocytic leukemia) ([place])    Rx / DC Orders ED Discharge Orders     None        Hayden Rasmussen, MD 10/05/21 1015

## 2021-10-04 NOTE — ED Notes (Signed)
Patient verbalizes understanding of discharge instructions. Opportunity for questioning and answers were provided. Patient discharged from ED.  °

## 2021-10-04 NOTE — ED Notes (Signed)
Patient transported to CT 

## 2021-10-04 NOTE — ED Triage Notes (Signed)
Pt arrives to ED with c/o of weakness, lethargy, and confusion for the past two weeks. Per husband these symptoms worsened today. She was admitted to the hospital for [place] pneumonia on 10/5 and d/c home on 10/9. Pt reports that she feels fatigued and has worsened today. She states that she has new onset of tremors. She has hx of CLL.

## 2021-10-04 NOTE — Discharge Instructions (Signed)
You were seen in the emergency department for evaluation of some confusion and tremor.  You had lab work chest x-ray and a CAT scan of your head that showed your white blood cell count to be elevated, higher than your typical CLL.  You were offered admission to the hospital and declined feeling you could follow-up with your primary care doctor.  Please return to the emergency department if any worsening or concerning symptoms

## 2021-10-05 LAB — URINE CULTURE: Culture: NO GROWTH

## 2021-10-06 ENCOUNTER — Other Ambulatory Visit: Payer: Self-pay | Admitting: Internal Medicine

## 2021-10-07 ENCOUNTER — Ambulatory Visit (INDEPENDENT_AMBULATORY_CARE_PROVIDER_SITE_OTHER): Payer: Medicare Other

## 2021-10-07 ENCOUNTER — Other Ambulatory Visit: Payer: Self-pay

## 2021-10-07 VITALS — Temp 97.7°F

## 2021-10-07 DIAGNOSIS — Z23 Encounter for immunization: Secondary | ICD-10-CM

## 2021-10-07 NOTE — Progress Notes (Signed)
Patient presents for flu shot today

## 2021-11-16 ENCOUNTER — Inpatient Hospital Stay: Payer: Medicare Other | Admitting: Hematology

## 2021-11-16 ENCOUNTER — Inpatient Hospital Stay: Payer: Medicare Other

## 2021-11-25 ENCOUNTER — Ambulatory Visit: Payer: Medicare Other | Admitting: Internal Medicine

## 2021-12-04 ENCOUNTER — Other Ambulatory Visit: Payer: Self-pay | Admitting: Adult Health

## 2021-12-04 DIAGNOSIS — F419 Anxiety disorder, unspecified: Secondary | ICD-10-CM

## 2021-12-04 DIAGNOSIS — F331 Major depressive disorder, recurrent, moderate: Secondary | ICD-10-CM

## 2021-12-07 DIAGNOSIS — M7061 Trochanteric bursitis, right hip: Secondary | ICD-10-CM | POA: Diagnosis not present

## 2021-12-07 DIAGNOSIS — M5416 Radiculopathy, lumbar region: Secondary | ICD-10-CM | POA: Diagnosis not present

## 2021-12-08 ENCOUNTER — Ambulatory Visit: Payer: Medicare Other | Admitting: Internal Medicine

## 2021-12-20 ENCOUNTER — Inpatient Hospital Stay: Payer: Medicare Other

## 2021-12-20 ENCOUNTER — Inpatient Hospital Stay: Payer: Medicare Other | Admitting: Hematology

## 2021-12-22 DIAGNOSIS — M5416 Radiculopathy, lumbar region: Secondary | ICD-10-CM | POA: Diagnosis not present

## 2021-12-26 ENCOUNTER — Other Ambulatory Visit: Payer: Self-pay | Admitting: Nurse Practitioner

## 2021-12-26 DIAGNOSIS — E871 Hypo-osmolality and hyponatremia: Secondary | ICD-10-CM

## 2022-01-17 DIAGNOSIS — M5416 Radiculopathy, lumbar region: Secondary | ICD-10-CM | POA: Diagnosis not present

## 2022-01-17 DIAGNOSIS — M47816 Spondylosis without myelopathy or radiculopathy, lumbar region: Secondary | ICD-10-CM | POA: Diagnosis not present

## 2022-01-18 ENCOUNTER — Ambulatory Visit: Payer: Medicare Other | Admitting: Adult Health

## 2022-01-18 ENCOUNTER — Other Ambulatory Visit: Payer: Self-pay | Admitting: Adult Health

## 2022-01-18 ENCOUNTER — Other Ambulatory Visit: Payer: Self-pay

## 2022-01-18 ENCOUNTER — Encounter: Payer: Self-pay | Admitting: Adult Health

## 2022-01-18 ENCOUNTER — Encounter: Payer: Self-pay | Admitting: Hematology

## 2022-01-18 VITALS — BP 166/82 | HR 56 | Temp 97.3°F | Wt 186.0 lb

## 2022-01-18 DIAGNOSIS — R10816 Epigastric abdominal tenderness: Secondary | ICD-10-CM

## 2022-01-18 DIAGNOSIS — R1011 Right upper quadrant pain: Secondary | ICD-10-CM

## 2022-01-18 MED ORDER — OMEPRAZOLE 20 MG PO CPDR: DELAYED_RELEASE_CAPSULE | ORAL | 1 refills | Status: DC

## 2022-01-18 NOTE — Patient Instructions (Addendum)
Please start omeprazole 20 mg twice daily   Stick to bland foods, avoid alcohol, caffeine, artificial sweeteners, very fatty foods, spicy foods  Try heat, can do topical pain creams on stomach  Monitor symptoms and keep a log - write if anything seems to make this better or worse  If not improving suggest we get a liver ultrasound and chest xray as well   Please schedule overdue mammogram    HOW TO SCHEDULE A MAMMOGRAM  [facility]  7 a.m.-6:30 p.m., Monday 7 a.m.-5 p.m., Tuesday-Friday Schedule an appointment by calling [phone].  [facility] Schedule an appointment by calling [phone].         Abdominal Pain, Adult Pain in the abdomen (abdominal pain) can be caused by many things. Often, abdominal pain is not serious and it gets better with no treatment or by being treated at home. However, sometimes abdominal pain is serious. Your health care provider will ask questions about your medical history and do a physical exam to try to determine the cause of your abdominal pain. Follow these instructions at home: Medicines Take over-the-counter and prescription medicines only as told by your health care provider. Do not take a laxative unless told by your health care provider. General instructions  Watch your condition for any changes. Drink enough fluid to keep your urine pale yellow. Keep all follow-up visits as told by your health care provider. This is important. Contact a health care provider if: Your abdominal pain changes or gets worse. You are not hungry or you lose weight without trying. You are constipated or have diarrhea for more than 2-3 days. You have pain when you urinate or have a bowel movement. Your abdominal pain wakes you up at night. Your pain gets worse with meals, after eating, or with certain foods. You are vomiting and cannot keep anything down. You have a fever. You have blood in your urine. Get help  right away if: Your pain does not go away as soon as your health care provider told you to expect. You cannot stop vomiting. Your pain is only in areas of the abdomen, such as the right side or the left lower portion of the abdomen. Pain on the right side could be caused by appendicitis. You have bloody or black stools, or stools that look like tar. You have severe pain, cramping, or bloating in your abdomen. You have signs of dehydration, such as: Dark urine, very little urine, or no urine. Cracked lips. Dry mouth. Sunken eyes. Sleepiness. Weakness. You have trouble breathing or chest pain. Summary Often, abdominal pain is not serious and it gets better with no treatment or by being treated at home. However, sometimes abdominal pain is serious. Watch your condition for any changes. Take over-the-counter and prescription medicines only as told by your health care provider. Contact a health care provider if your abdominal pain changes or gets worse. Get help right away if you have severe pain, cramping, or bloating in your abdomen. This information is not intended to replace advice given to you by your health care provider. Make sure you discuss any questions you have with your health care provider. Document Revised: 01/10/2020 Document Reviewed: 04/01/2019 Elsevier Patient Education  [place].

## 2022-01-18 NOTE — Progress Notes (Signed)
Assessment and Plan:  Rebecca Bond was seen today for acute visit.  Diagnoses and all orders for this visit:  RUQ abdominal pain Epigastric abdominal tenderness without rebound tenderness Fairly anxious patient with vague sx, ? Multiple etiologies overlapping, most recent in last 3-4 weeks but long hx of intermittent ongoing GI sx with large workup by GI in recent past Considering lack of cardiac sx and tenderness low suspicion for etology CLL may also be contributing -  However rest and heat being main alleviating factors is fairly reassuring Her main concerns today are recurrent gastritis and liver She is fairly tender throughout abdomen (patient reports intermittently ongoing, unclear how much is new, declines imaging today after discussion/review or her imaging from recent years, however consider US/repeat abd CT if any worsening or persistent) Labs as ordered as below Follow bland diet Keep log of sx Restart omeprazole 20 mg BID Encouraged her to discuss sx with Dr. Irene Bond as well Schedule overdue screening mammogram -  Follow up 2 weeks or sooner if any new sx or not improving Please go to the ER if you have any severe AB pain, unable to hold down food/water, blood in stool or vomit, chest pain, shortness of breath, or any worsening symptoms.  -     CBC with Differential/Platelet -     COMPLETE METABOLIC PANEL WITH GFR -     Urinalysis, Routine w reflex microscopic -     Lipase -     omeprazole (PRILOSEC) 20 MG capsule; Take 1 cap 30 min prior to breakfast and evening meal for 2 weeks, continue if pain is improved.  Further disposition pending results of labs. Discussed med's effects and SE's.   Over 30 minutes of exam, counseling, chart review, and critical decision making was performed.   Future Appointments  Date Time Provider [place]  05/16/2022  2:00 PM [name], MD [facility] None  09/29/2022  4:00 PM Magda Bernheim, NP [facility] None     ------------------------------------------------------------------------------------------------------------------   HPI BP (!) 166/82    Pulse (!) 56    Temp (!) 97.3 F (36.3 C)    Wt 186 lb (84.4 kg)    SpO2 98%    BMI 34.02 kg/m  76 y.o.female with htn, fatty liver, hepatitis A, GERD, CLL presents for evaluation of upper abdominal/lower chest/breast pain.   She reports hx of chronic intermittent diarrhea following lab chole; recent sx began 3-4 weeks ago started having upper abdominal pain and R lower chest under breast wrapping around her back, had some atypical diarrhea, had some sense of gagging and emesis of "white stuff." Has had persistent intermittent RUQ pain, nausea since without emesis. She reports taking bra off seemed to help. Nausea improves with antiemetic she has at home (unsure what). She doesn't correlate with exertion, not necessarily with food intake, feels position is best predictor for worsening/alleviating (bending over or pressing makes worse, improves with supine rest).   She feels pain is similar to gastritis in the past, prescribed omeprazole 20 mg BID but hasn't been taking, has tried tums and maalox without perceived benefit. Denies burning in chest, belching, bloating. Denies recent NSAID, steroid, alcohol, caffeine use.   She is aware of her fatty liver hx, was very worries this may be related to her liver.   She denies chest tightness, dyspnea, palpitations, edema, atypial fatigue.   She denies rash in this area,   Last mammogram 07/03/2020 was negative at [facility], overdue to schedule - has had some nipple tenderness,  otherwise no lump/mass/rash/discharge  She had MRI abd w/wo CM/MRCP in 08/23/2020 (note ordered by GI, also for RUQ pain sx) that showed mild fatty liver, benign appearing cysts, overall unremarkable  CT abd 04/08/2020 -  FINDINGS: Lower chest: Mild bibasilar atelectasis. Hepatobiliary: Unchanged small cyst of the right hepatic lobe.  Post cholecystectomy. No unexpected biliary dilatation. Pancreas: Unremarkable. Spleen: Unremarkable. Adrenals/Urinary Tract: Adrenals and kidneys are unremarkable. The bladder is minimally distended but unremarkable. Stomach/Bowel: Stomach is within normal limits. The bowel is normal in caliber. Appendix is not visualized and probably surgically absent. Vascular/Lymphatic: Minor aortic atherosclerosis. No enlarged lymph nodes identified. Reproductive: Status post hysterectomy. No adnexal masses. Other: No ascites. Musculoskeletal: Degenerative changes of the lumbar spine.   Last CXR 10/04/2021 showed bronchitic changes, otherwise benign -   Hx of fatty liver, cholecystectomy   She follows with Dr. Irene Bond for CLL, last 07/2021, per his note:  Stage IV CLL with lymphocytosis, LNadenopathy and mild splenomegaly with anemia and thrombocytopenia. -patient has previously and continues to desire holding off CLL directed treatment as long as possible.  He recommended 4 month recall for follow up and labs that is overdue -  CBC Latest Ref Rng & Units 10/04/2021 09/29/2021 09/12/2021  WBC 4.0 - 10.5 K/uL 205.9(HH) 252.3(H) 108.8(HH)  Hemoglobin 12.0 - 15.0 g/dL 10.4(L) 10.0(L) 10.3(L)  Hematocrit 36.0 - 46.0 % 34.6(L) 34.0(L) 31.9(L)  Platelets 150 - 400 K/uL 95(L) 89(L) 145(L)     Past Medical History:  Diagnosis Date   Anemia    Anxiety    Arthritis    CLL (chronic lymphocytic leukemia) ([place]) dx'd ~ 2015   Depression    GERD (gastroesophageal reflux disease)    Headache    "sinus headaches"   History of kidney stones    Hypertension    OSA (obstructive sleep apnea)    Osteoarthritis    Pneumonia    Prediabetes    Recurrent sinus infections 08/02/2012   Tubular adenoma of colon      Allergies  Allergen Reactions   Hyzaar [Losartan Potassium-Hctz] Swelling and Other (See Comments)    "messed up my sodium counts"and swelled lips   Sudafed [Pseudoephedrine Hcl] Palpitations    Levaquin [Levofloxacin In D5w] Diarrhea and Nausea Only   Acyclovir And Related Other (See Comments)    Reaction not recalled   Biaxin [Clarithromycin]     GI Upset   Cantaloupe Extract Allergy Skin Test Other (See Comments)    Nasal congestion, headaches, and sneezing   Celexa [Citalopram] Other (See Comments)    Reaction not recalled   Flexeril [Cyclobenzaprine]     "zombie-like" feeling   Fosamax [Alendronate Sodium]     GI upset   Gabapentin     confusion   Iohexol Hives and Other (See Comments)    Patient stated she broke out in hive 20 yrs ago from IV contrast      Losartan Swelling and Other (See Comments)    Angioedema   Meloxicam     GI upset   Norvasc [Amlodipine] Swelling and Other (See Comments)    Ankles swell   Other Nausea Only and Other (See Comments)    Iceburg lettuce- "made me dreadfully nauseous" Raw mushrooms only-  "made me dreadfully nauseous" (cooked ones are tolerated)   Pseudoephedrine     Palpitations   Zoloft [Sertraline Hcl] Other (See Comments)    Has no emotions at all     Current Outpatient Medications on File Prior to Visit  Medication Sig  acetaminophen (TYLENOL) 325 MG tablet Take 650 mg by mouth every 6 (six) hours as needed for mild pain, fever or headache.   ALPRAZolam (XANAX) 1 MG tablet TAKE 1/2 - 1 TABLET 2 - 3 X /DAY ONLY IF NEEDED FOR PANIC ATTACK/ANXIETY ATTACK & LIMIT TO 5 DAYS /WEEK TO AVOID ADDICTION & DEMENTIA   bisoprolol (ZEBETA) 10 MG tablet Take  1 tablet  Daily  for BP                                                                       /                                 TAKE       BY  MOUTH   buPROPion (WELLBUTRIN XL) 150 MG 24 hr tablet Take  1 tablet  Daily  for Mood / Depression, Focus  & Concentration                                            /                          TAKE  BY MOUTH   Cholecalciferol (VITAMIN D3) 250 MCG (10000 UT) TABS Take 5,000 Units by mouth in the morning.   ezetimibe (ZETIA) 10 MG tablet TAKE  ONE TABLET BY MOUTH DAILY FOR CHOLESTEROL (Patient taking differently: Take 10 mg by mouth daily.)   FLUoxetine (PROZAC) 20 MG capsule TAKE 1 CAPSULE BY MOUTH DAILY FOR MOOD (Patient taking differently: Take 20 mg by mouth daily.)   levothyroxine (SYNTHROID) 50 MCG tablet Take 1 tablet (50 mcg total) by mouth daily.   Multiple Vitamins-Minerals (ZINC PO) Take 1 tablet by mouth daily.   omeprazole (PRILOSEC) 20 MG capsule Take 20 mg by mouth 2 (two) times daily as needed (indigestion).   RESTASIS 0.05 % ophthalmic emulsion Place 1 drop into both eyes 2 (two) times daily as needed (for seasonal allergies/irritation).    vitamin C (ASCORBIC ACID) 500 MG tablet Take 500 mg by mouth daily.   dexamethasone (DECADRON) 4 MG tablet Take 1 tab 3 x day - 3 days, then 2 x day - 3 days, then 1 tab daily   promethazine-codeine (PHENERGAN WITH CODEINE) 6.25-10 MG/5ML syrup Take 1 or 2 teaspoonful    4 x /day    as needed for Cough / Congestion (Patient not taking: Reported on 01/18/2022)   No current facility-administered medications on file prior to visit.    ROS: all negative except above.   Physical Exam:  BP (!) 166/82    Pulse (!) 56    Temp (!) 97.3 F (36.3 C)    Wt 186 lb (84.4 kg)    SpO2 98%    BMI 34.02 kg/m   General Appearance: Well nourished, in no apparent distress. Eyes: PERRLA, EOMs, conjunctiva no swelling or erythema Sinuses: No Frontal/maxillary tenderness ENT/Mouth: Ext aud canals clear, TMs without erythema, bulging. No erythema, swelling, or exudate on post pharynx.  Tonsils  not swollen or erythematous. Hearing normal.  Neck: Supple, thyroid normal.  Respiratory: Respiratory effort normal, BS equal bilaterally without rales, rhonchi, wheezing or stridor.  Cardio: RRR with no MRGs. Brisk peripheral pulses without edema.  Abdomen: Soft, + BS x 4 .  Generalized tenderness, worst epigastric, left mid, right lateral, some guarding, no rebound, hernias, specific palpated masses though   deep palptation limited by tenderness. . Lymphatics: Non tender without lymphadenopathy.  Musculoskeletal: Full ROM, 5/5 strength, normal gait. Some lumbar paraspinal tenderness, no midline tenderness, no specific chest wall tenderness.  Skin: Warm, dry without rashes, lesions, ecchymosis.  Neuro: Cranial nerves intact. Normal muscle tone, no cerebellar symptoms. Sensation intact.  Psych: Awake and oriented X 3, normal affect, Insight and Judgment appropriate.  Breasts: breasts large and pendulous, bil appear normal, no suspicious masses, no skin or nipple changes or axillary nodes.    Izora Ribas, NP 12:20 PM [facility]

## 2022-01-19 ENCOUNTER — Encounter: Payer: Self-pay | Admitting: Hematology

## 2022-01-19 LAB — URINALYSIS, ROUTINE W REFLEX MICROSCOPIC
Bacteria, UA: NONE SEEN /HPF
Bilirubin Urine: NEGATIVE
Glucose, UA: NEGATIVE
Hgb urine dipstick: NEGATIVE
Hyaline Cast: NONE SEEN /LPF
Ketones, ur: NEGATIVE
Nitrite: NEGATIVE
RBC / HPF: NONE SEEN /HPF (ref 0–2)
Specific Gravity, Urine: 1.011 (ref 1.001–1.035)
pH: 5.5 (ref 5.0–8.0)

## 2022-01-19 LAB — COMPLETE METABOLIC PANEL WITH GFR
AG Ratio: 2.9 (calc) — ABNORMAL HIGH (ref 1.0–2.5)
ALT: 11 U/L (ref 6–29)
AST: 19 U/L (ref 10–35)
Albumin: 4.7 g/dL (ref 3.6–5.1)
Alkaline phosphatase (APISO): 61 U/L (ref 37–153)
BUN/Creatinine Ratio: 14 (calc) (ref 6–22)
BUN: 15 mg/dL (ref 7–25)
CO2: 23 mmol/L (ref 20–32)
Calcium: 9.8 mg/dL (ref 8.6–10.4)
Chloride: 103 mmol/L (ref 98–110)
Creat: 1.06 mg/dL — ABNORMAL HIGH (ref 0.60–1.00)
Globulin: 1.6 g/dL (calc) — ABNORMAL LOW (ref 1.9–3.7)
Glucose, Bld: 104 mg/dL — ABNORMAL HIGH (ref 65–99)
Potassium: 4.2 mmol/L (ref 3.5–5.3)
Sodium: 138 mmol/L (ref 135–146)
Total Bilirubin: 0.7 mg/dL (ref 0.2–1.2)
Total Protein: 6.3 g/dL (ref 6.1–8.1)
eGFR: 55 mL/min/{1.73_m2} — ABNORMAL LOW (ref >=60)

## 2022-01-19 LAB — CBC WITH DIFFERENTIAL/PLATELET
Absolute Monocytes: 434 cells/uL (ref 200–950)
Basophils Absolute: 77 cells/uL (ref 0–200)
Basophils Relative: 0.3 %
Eosinophils Absolute: 179 cells/uL (ref 15–500)
Eosinophils Relative: 0.7 %
HCT: 35.2 % (ref 35.0–45.0)
Hemoglobin: 12.1 g/dL (ref 11.7–15.5)
Lymphs Abs: 20094 cells/uL — ABNORMAL HIGH (ref 850–3900)
MCH: 34.4 pg — ABNORMAL HIGH (ref 27.0–33.0)
MCHC: 34.4 g/dL (ref 32.0–36.0)
MCV: 100 fL (ref 80.0–100.0)
MPV: 9.3 fL (ref 7.5–12.5)
Monocytes Relative: 1.7 %
Neutro Abs: 4718 cells/uL (ref 1500–7800)
Neutrophils Relative %: 18.5 %
Platelets: 75 10*3/uL — ABNORMAL LOW (ref 140–400)
RBC: 3.52 10*6/uL — ABNORMAL LOW (ref 3.80–5.10)
RDW: 13 % (ref 11.0–15.0)
Total Lymphocyte: 78.8 %
WBC: 25.5 10*3/uL — ABNORMAL HIGH (ref 3.8–10.8)

## 2022-01-19 LAB — LIPASE: Lipase: 23 U/L (ref 7–60)

## 2022-01-19 LAB — MICROSCOPIC MESSAGE

## 2022-01-19 NOTE — Progress Notes (Signed)
Pt is aware of lab results and recommendations and did not have any questions for provider

## 2022-01-20 ENCOUNTER — Telehealth: Payer: Self-pay

## 2022-01-20 ENCOUNTER — Telehealth: Payer: Self-pay | Admitting: Adult Health

## 2022-01-20 NOTE — Telephone Encounter (Signed)
Patient asks, Based on labs, do you recommend any imaging?

## 2022-01-20 NOTE — Telephone Encounter (Signed)
Patient is requesting something for nausea. 

## 2022-01-21 ENCOUNTER — Telehealth: Payer: Self-pay | Admitting: Hematology

## 2022-01-21 ENCOUNTER — Other Ambulatory Visit: Payer: Self-pay | Admitting: Adult Health

## 2022-01-21 DIAGNOSIS — R10811 Right upper quadrant abdominal tenderness: Secondary | ICD-10-CM

## 2022-01-21 MED ORDER — ONDANSETRON HCL 4 MG PO TABS: 4.0000 mg | ORAL_TABLET | Freq: Three times a day (TID) | ORAL | 0 refills | Status: DC | PRN

## 2022-01-21 NOTE — Telephone Encounter (Signed)
Sch per 2/17 inbasket , pt is aware

## 2022-01-26 ENCOUNTER — Telehealth: Payer: Self-pay | Admitting: Internal Medicine

## 2022-01-26 NOTE — Chronic Care Management (AMB) (Signed)
°  Chronic Care Management   Outreach Note  01/26/2022 Name: Rebecca Bond MRN: [id number] DOB: October 31, 1946  Referred by: [name], MD Reason for referral : No chief complaint on file.   An unsuccessful telephone outreach was attempted today. The patient was referred to the pharmacist for assistance with care management and care coordination.   Follow Up Plan:   Tatjana Dellinger Upstream Scheduler

## 2022-01-28 ENCOUNTER — Ambulatory Visit
Admission: RE | Admit: 2022-01-28 | Discharge: 2022-01-28 | Disposition: A | Discharge status: Home / Self Care | Payer: Medicare Other | Source: Ambulatory Visit | Attending: Adult Health | Admitting: Adult Health

## 2022-01-28 DIAGNOSIS — K7689 Other specified diseases of liver: Secondary | ICD-10-CM | POA: Diagnosis not present

## 2022-01-28 DIAGNOSIS — R1011 Right upper quadrant pain: Secondary | ICD-10-CM | POA: Diagnosis not present

## 2022-01-28 DIAGNOSIS — R10811 Right upper quadrant abdominal tenderness: Secondary | ICD-10-CM

## 2022-01-28 DIAGNOSIS — Z9049 Acquired absence of other specified parts of digestive tract: Secondary | ICD-10-CM | POA: Diagnosis not present

## 2022-01-28 DIAGNOSIS — R161 Splenomegaly, not elsewhere classified: Secondary | ICD-10-CM | POA: Diagnosis not present

## 2022-02-01 ENCOUNTER — Encounter: Payer: Self-pay | Admitting: Adult Health

## 2022-02-01 ENCOUNTER — Telehealth: Payer: Self-pay | Admitting: Internal Medicine

## 2022-02-01 DIAGNOSIS — K760 Fatty (change of) liver, not elsewhere classified: Secondary | ICD-10-CM | POA: Insufficient documentation

## 2022-02-01 NOTE — Chronic Care Management (AMB) (Signed)
°  Chronic Care Management   Outreach Note  02/01/2022 Name: Rebecca Bond MRN: [id number] DOB: 03-Jul-1946  Referred by: [name], MD Reason for referral : No chief complaint on file.   A second unsuccessful telephone outreach was attempted today. The patient was referred to pharmacist for assistance with care management and care coordination.  Follow Up Plan:   Tatjana Dellinger Upstream Scheduler

## 2022-02-02 ENCOUNTER — Ambulatory Visit: Payer: Medicare Other | Admitting: Adult Health

## 2022-02-04 ENCOUNTER — Telehealth: Payer: Self-pay | Admitting: Internal Medicine

## 2022-02-04 ENCOUNTER — Other Ambulatory Visit: Payer: Self-pay | Admitting: *Deleted

## 2022-02-04 DIAGNOSIS — C9110 Chronic lymphocytic leukemia of B-cell type not having achieved remission: Secondary | ICD-10-CM

## 2022-02-04 NOTE — Chronic Care Management (AMB) (Signed)
?  Chronic Care Management  ? ?Outreach Note ? ?02/04/2022 ?Name: Rebecca Bond MRN: [id number] DOB: Oct 08, 1946 ? ?Referred by: [name], MD ?Reason for referral : No chief complaint on file. ? ? ?Third unsuccessful telephone outreach was attempted today. The patient was referred to the pharmacist for assistance with care management and care coordination.  ? ?Follow Up Plan:  ? ?Tatjana Dellinger ?Upstream Scheduler  ?

## 2022-02-08 ENCOUNTER — Inpatient Hospital Stay: Payer: Medicare Other | Attending: Hematology

## 2022-02-08 ENCOUNTER — Inpatient Hospital Stay: Payer: Medicare Other | Admitting: Hematology

## 2022-02-08 ENCOUNTER — Other Ambulatory Visit: Payer: Self-pay

## 2022-02-08 VITALS — BP 156/75 | HR 71 | Temp 97.5°F | Resp 18 | Wt 188.7 lb

## 2022-02-08 DIAGNOSIS — C9110 Chronic lymphocytic leukemia of B-cell type not having achieved remission: Secondary | ICD-10-CM

## 2022-02-08 DIAGNOSIS — Z90710 Acquired absence of both cervix and uterus: Secondary | ICD-10-CM | POA: Diagnosis not present

## 2022-02-08 DIAGNOSIS — D801 Nonfamilial hypogammaglobulinemia: Secondary | ICD-10-CM | POA: Insufficient documentation

## 2022-02-08 DIAGNOSIS — D696 Thrombocytopenia, unspecified: Secondary | ICD-10-CM

## 2022-02-08 DIAGNOSIS — Z87891 Personal history of nicotine dependence: Secondary | ICD-10-CM | POA: Diagnosis not present

## 2022-02-08 DIAGNOSIS — I10 Essential (primary) hypertension: Secondary | ICD-10-CM | POA: Insufficient documentation

## 2022-02-08 LAB — CBC WITH DIFFERENTIAL (CANCER CENTER ONLY)
Abs Immature Granulocytes: 0.15 10*3/uL — ABNORMAL HIGH (ref 0.00–0.07)
Basophils Absolute: 0.1 10*3/uL (ref 0.0–0.1)
Basophils Relative: 0 %
Eosinophils Absolute: 0.3 10*3/uL (ref 0.0–0.5)
Eosinophils Relative: 1 %
HCT: 33.2 % — ABNORMAL LOW (ref 36.0–46.0)
Hemoglobin: 11.3 g/dL — ABNORMAL LOW (ref 12.0–15.0)
Immature Granulocytes: 0 %
Lymphocytes Relative: 84 %
Lymphs Abs: 30 10*3/uL — ABNORMAL HIGH (ref 0.7–4.0)
MCH: 33.5 pg (ref 26.0–34.0)
MCHC: 34 g/dL (ref 30.0–36.0)
MCV: 98.5 fL (ref 80.0–100.0)
Monocytes Absolute: 0.4 10*3/uL (ref 0.1–1.0)
Monocytes Relative: 1 %
Neutro Abs: 5.2 10*3/uL (ref 1.7–7.7)
Neutrophils Relative %: 14 %
Platelet Count: 77 10*3/uL — ABNORMAL LOW (ref 150–400)
RBC: 3.37 MIL/uL — ABNORMAL LOW (ref 3.87–5.11)
RDW: 13.3 % (ref 11.5–15.5)
WBC Count: 36 10*3/uL — ABNORMAL HIGH (ref 4.0–10.5)
nRBC: 0 % (ref 0.0–0.2)

## 2022-02-08 LAB — CMP (CANCER CENTER ONLY)
ALT: 12 U/L (ref 0–44)
AST: 20 U/L (ref 15–41)
Albumin: 4.3 g/dL (ref 3.5–5.0)
Alkaline Phosphatase: 70 U/L (ref 38–126)
Anion gap: 8 (ref 5–15)
BUN: 19 mg/dL (ref 8–23)
CO2: 26 mmol/L (ref 22–32)
Calcium: 9 mg/dL (ref 8.9–10.3)
Chloride: 98 mmol/L (ref 98–111)
Creatinine: 0.92 mg/dL (ref 0.44–1.00)
GFR, Estimated: >60 mL/min (ref >60)
Glucose, Bld: 110 mg/dL — ABNORMAL HIGH (ref 70–99)
Potassium: 4.3 mmol/L (ref 3.5–5.1)
Sodium: 132 mmol/L — ABNORMAL LOW (ref 135–145)
Total Bilirubin: 0.6 mg/dL (ref 0.3–1.2)
Total Protein: 6.3 g/dL — ABNORMAL LOW (ref 6.5–8.1)

## 2022-02-08 LAB — LACTATE DEHYDROGENASE: LDH: 173 U/L (ref 98–192)

## 2022-02-09 ENCOUNTER — Telehealth: Payer: Self-pay | Admitting: Hematology

## 2022-02-09 NOTE — Telephone Encounter (Signed)
Scheduled follow-up appointment per 3/7 los. Patient is aware. ?

## 2022-02-10 ENCOUNTER — Other Ambulatory Visit: Payer: Self-pay

## 2022-02-10 ENCOUNTER — Ambulatory Visit: Payer: Medicare Other | Admitting: Adult Health

## 2022-02-10 DIAGNOSIS — E782 Mixed hyperlipidemia: Secondary | ICD-10-CM

## 2022-02-10 MED ORDER — EZETIMIBE 10 MG PO TABS: ORAL_TABLET | ORAL | 1 refills | Status: DC

## 2022-02-13 ENCOUNTER — Other Ambulatory Visit: Payer: Self-pay | Admitting: Internal Medicine

## 2022-02-13 DIAGNOSIS — E782 Mixed hyperlipidemia: Secondary | ICD-10-CM

## 2022-02-13 MED ORDER — EZETIMIBE 10 MG PO TABS: ORAL_TABLET | ORAL | 3 refills | Status: DC

## 2022-02-14 ENCOUNTER — Encounter: Payer: Self-pay | Admitting: Hematology

## 2022-02-14 NOTE — Progress Notes (Signed)
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 (patient reports -planned for August 2017) 13q deletion does pre-dispose her to recurrent SCC  Current Treatment: observation  Previous treatment: IVIG for several months last winter to reduce recurrent respiratory infections (Patient notes that this helped) Has not required definitive treatment for CLL at this time and has been reluctant to consider treatment when discussed in the setting of thrombocytopenia.  INTERVAL HISTORY:  Rebecca Bond is here for her 3 to 55-monthfollow-up for continued evaluation and management of CLL . She has been seeing her primary care physician for concerns with right upper quadrant abdominal pain. Currently does not note any significant abdominal pain.  Has been restarted on PPI twice daily by PCP. Notes no new fatigue.  No new bleeding issues.  No fevers no chills no night sweats no unexpected weight loss. No obvious bleeding issues.  Labs done today and clinic were reviewed in detail with her.  REVIEW OF SYSTEMS:   10 Point review of Systems was done is negative except as noted above.   Past Medical History:  Diagnosis Date   Anemia    Anxiety    Arthritis    [place]   Depression    GERD (gastroesophageal reflux disease)    Headache    "sinus headaches"   History of kidney stones     Hypertension    OSA (obstructive sleep apnea)    Osteoarthritis    Pneumonia    Prediabetes    Recurrent sinus infections 08/02/2012   Tubular adenoma of colon     . Past Surgical History:  Procedure Laterality Date   ABDOMINAL HYSTERECTOMY  1980's   "endometrosis"   APPENDECTOMY     BREAST SURGERY     CATARACT EXTRACTION, BILATERAL     CHOLECYSTECTOMY  1985   FUNCTIONAL ENDOSCOPIC SINUS SURGERY  1990's   "cause I kept having sinus infections"   immunoglobulin treatment  2017   LAPAROSCOPIC APPENDECTOMY N/A 06/04/2014   Procedure: APPENDECTOMY LAPAROSCOPIC;  Surgeon: BZenovia Jarred MD;  Location: [facility]  Service: General;  Laterality: N/A;   LUMBAR LAMINECTOMY/DECOMPRESSION MICRODISCECTOMY Left 11/01/2017   Procedure: Left Lumbar Four-Five Extraforaminal Microdiscectomy;  Surgeon: JEustace Moore MD;  [place] [facility]  Service: Neurosurgery;  Laterality: Left;   LYMPH NODE BIOPSY     "determined I had CLL"   SHOULDER ARTHROSCOPY Right    TEMPOROMANDIBULAR JOINT ARTHROPLASTY  1980's   TONSILLECTOMY AND ADENOIDECTOMY  1950's    . Social History   Tobacco Use   Smoking status: Former    Packs/day: 0.75    Years: 4.00    Pack years: 3.00    Types: Cigarettes    Quit date: 12/05/1968    Years since quitting: 53.2   Smokeless tobacco: Never  Vaping Use   Vaping Use: Never used  Substance Use Topics   Alcohol use: Yes    Alcohol/week: 7.0 standard  drinks    Types: 7 Glasses of wine per week   Drug use: No    ALLERGIES:  is allergic to hyzaar [losartan potassium-hctz], sudafed [pseudoephedrine hcl], levaquin [levofloxacin in d5w], acyclovir and related, biaxin [clarithromycin], cantaloupe extract allergy skin test, celexa [citalopram], flexeril [cyclobenzaprine], fosamax [alendronate sodium], gabapentin, iohexol, losartan, meloxicam, norvasc [amlodipine], other, pseudoephedrine, and zoloft [sertraline hcl].  MEDICATIONS:  Current Outpatient Medications  Medication  Sig Dispense Refill   acetaminophen (TYLENOL) 325 MG tablet Take 650 mg by mouth every 6 (six) hours as needed for mild pain, fever or headache.     ALPRAZolam (XANAX) 1 MG tablet TAKE 1/2 - 1 TABLET 2 - 3 X /DAY ONLY IF NEEDED FOR PANIC ATTACK/ANXIETY ATTACK & LIMIT TO 5 DAYS /WEEK TO AVOID ADDICTION & DEMENTIA 90 tablet 0   bisoprolol (ZEBETA) 10 MG tablet Take  1 tablet  Daily  for BP                                                                       /                                 TAKE       BY  MOUTH 90 tablet 3   buPROPion (WELLBUTRIN XL) 150 MG 24 hr tablet Take  1 tablet  Daily  for Mood / Depression, Focus  & Concentration                                            /                          TAKE  BY MOUTH 90 tablet 3   Cholecalciferol (VITAMIN D3) 250 MCG (10000 UT) TABS Take 5,000 Units by mouth in the morning.     ezetimibe (ZETIA) 10 MG tablet Take  1 tablet  Daily for Cholesterol                                            /                                  TAKE                BY           MOUTH 90 tablet 3   FLUoxetine (PROZAC) 20 MG capsule TAKE 1 CAPSULE BY MOUTH DAILY FOR MOOD (Patient taking differently: Take 20 mg by mouth daily.) 90 capsule 3   levothyroxine (SYNTHROID) 50 MCG tablet Take 1 tablet (50 mcg total) by mouth daily. 30 tablet 11   Multiple Vitamins-Minerals (ZINC PO) Take 1 tablet by mouth daily.     omeprazole (PRILOSEC) 20 MG capsule TAKE 1 CAPSULE BY MOUTH 30 MINUTES BEFORE BREAKFAST AND EVENING MEAL FOR 2 WEEKS. CONTINUE IF PAIN  IS IMPROVED 180 capsule 2   ondansetron (ZOFRAN) 4 MG tablet Take 1 tablet (4 mg total) by mouth every 8 (eight) hours as needed for nausea or vomiting. 60 tablet 0   promethazine-codeine (PHENERGAN WITH CODEINE) 6.25-10 MG/5ML syrup Take 1 or 2 teaspoonful    4 x /day    as needed for Cough / Congestion (Patient not taking: Reported on 01/18/2022) 360 mL 1   RESTASIS 0.05 % ophthalmic emulsion Place 1 drop into both eyes 2 (two) times  daily as needed (for seasonal allergies/irritation).      vitamin C (ASCORBIC ACID) 500 MG tablet Take 500 mg by mouth daily.     No current facility-administered medications for this visit.    PHYSICAL EXAMINATION: ECOG PERFORMANCE STATUS:   Vitals:   02/08/22 1407  BP: (!) 156/75  Pulse: 71  Resp: 18  Temp: (!) 97.5 F (36.4 C)  SpO2: 98%    Filed Weights   02/08/22 1407  Weight: 188 lb 11.2 oz (85.6 kg)   .Body mass index is 34.51 kg/m.  NAD GENERAL:alert, in no acute distress and comfortable SKIN: no acute rashes, no significant lesions EYES: conjunctiva are pink and non-injected, sclera anicteric OROPHARYNX: MMM, no exudates, no oropharyngeal erythema or ulceration NECK: supple, no JVD LYMPH:  no palpable lymphadenopathy in the cervical, axillary or inguinal regions LUNGS: clear to auscultation b/l with normal respiratory effort HEART: regular rate & rhythm ABDOMEN:  normoactive bowel sounds , non tender, not distended. Extremity: no pedal edema PSYCH: alert & oriented x 3 with fluent speech NEURO: no focal motor/sensory deficits   LABORATORY DATA:   CBC Latest Ref Rng & Units 02/08/2022 01/18/2022 10/04/2021  WBC 4.0 - 10.5 K/uL 36.0(H) 25.5(H) 205.9(HH)  Hemoglobin 12.0 - 15.0 g/dL 11.3(L) 12.1 10.4(L)  Hematocrit 36.0 - 46.0 % 33.2(L) 35.2 34.6(L)  Platelets 150 - 400 K/uL 77(L) 75(L) 95(L)   CBC    Component Value Date/Time   WBC 36.0 (H) 02/08/2022 1359   WBC 25.5 (H) 01/18/2022 1256   RBC 3.37 (L) 02/08/2022 1359   HGB 11.3 (L) 02/08/2022 1359   HGB 11.2 (L) 11/08/2017 1418   HCT 33.2 (L) 02/08/2022 1359   HCT 34.8 11/08/2017 1418   PLT 77 (L) 02/08/2022 1359   PLT 140 (L) 11/08/2017 1418   MCV 98.5 02/08/2022 1359   MCV 96.4 11/08/2017 1418   MCH 33.5 02/08/2022 1359   MCHC 34.0 02/08/2022 1359   RDW 13.3 02/08/2022 1359   RDW 14.8 (H) 11/08/2017 1418   LYMPHSABS 30.0 (H) 02/08/2022 1359   LYMPHSABS 87.0 (H) 11/08/2017 1418   MONOABS 0.4  02/08/2022 1359   MONOABS 1.5 (H) 11/08/2017 1418   EOSABS 0.3 02/08/2022 1359   EOSABS 0.6 (H) 11/08/2017 1418   BASOSABS 0.1 02/08/2022 1359   BASOSABS 0.2 (H) 11/08/2017 1418    CMP Latest Ref Rng & Units 02/08/2022 01/18/2022 10/04/2021  Glucose 70 - 99 mg/dL 110(H) 104(H) 97  BUN 8 - 23 mg/dL '19 15 10  '$ Creatinine 0.44 - 1.00 mg/dL 0.92 1.06(H) 0.72  Sodium 135 - 145 mmol/L 132(L) 138 133(L)  Potassium 3.5 - 5.1 mmol/L 4.3 4.2 3.2(L)  Chloride 98 - 111 mmol/L 98 103 96(L)  CO2 22 - 32 mmol/L '26 23 24  '$ Calcium 8.9 - 10.3 mg/dL 9.0 9.8 8.8(L)  Total Protein 6.5 - 8.1 g/dL 6.3(L) 6.3 5.8(L)  Total Bilirubin 0.3 - 1.2 mg/dL 0.6 0.7 0.8  Alkaline Phos 38 - 126 U/L 70 -  38  AST 15 - 41 U/L '20 19 19  '$ ALT 0 - 44 U/L '12 11 27   '$ . Lab Results  Component Value Date   LDH 173 02/08/2022       RADIOGRAPHIC STUDIES: I have personally reviewed the radiological images as listed and agreed with the findings in the report.  CT ABDOMEN AND PELVIS WITHOUT CONTRAST    IMPRESSION: 1. No acute abnormalities within the abdomen or pelvis. 2. Mild splenomegaly and several prominent to mildly enlarged pelvic and inguinal lymph nodes. These findings are similar to the prior CT. 3. Small nonobstructing stone in the lower pole the right kidney. No ureteral stones or obstructive uropathy.     Electronically Signed   By: Lajean Manes M.D.   On: 11/14/2016 16:39     ASSESSMENT & PLAN:   76 y.o.  caucasian female with   1. Rai Stage 2 previously - now Stage IV CLL with lymphocytosis, LNadenopathy and mild splenomegaly with anemia and thrombocytopenia. -patient has previously and continues to desire holding off CLL directed treatment as long as possible.   Lab Results  Component Value Date   LDH 173 02/08/2022   PLAN: -Today's lab results were discussed in details CBC shows hemoglobin of 11.3 with a WBC count of 36k and platelets of 77k CMP unremarkable LDH within normal limits at  180. Patient has no clinical or lab findings suggestive of significant symptomatic CLL progression at this time. -Patient wants to continue holding off on CLL directed treatments and monitor his platelet counts closely at this time.    2.  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 Has been taking good infection prevention precautions. CLL initially diagnosed 2009 with 13q deletion. Only intervention thus far has been intermittent IVIG, clinically very helpful with decrease in respiratory infections.  Last imaging was CT AP 02-2015. --showed no evidence of Splenomegaly. Negative Hep B serology 2015  No overt new constitutional symptoms except mild unchanged fatigue. Has had significant recurrent headaches with IVIG - discontinued  3. Mild Anemia Hgb hemoglobin stable in the 10.5-11.3 range.  4. Moderate thrombocytopenia PLT count at 77k and has recently been in the 80-90k range.  5. H/o  Recurrent cutaneous SCC s/p Mohs surgery for SCC of the nosehealed. No issues with infection-   her 13q deletion places her at risk for recurrent SCC. -continue close f/u with dermatologist for evaluation and management of non melanoma skin cancers that can be increased in patient with CLL with 13q deletion.   FOLLOW UP: RTC with Dr Irene Limbo with labs in 4 months  The total time spent in the appointment was 22 minutes*.  All of the patient's questions were answered with apparent satisfaction. The patient knows to call the clinic with any problems, questions or concerns.   Sullivan Lone MD MS AAHIVMS [facility] Hematology/Oncology Physician [facility]  .*Total Encounter Time as defined by the Centers for Medicare and Medicaid Services includes, in addition to the face-to-face time of a patient visit (documented in the note above) non-face-to-face time: obtaining and reviewing outside history, ordering and reviewing medications, tests or procedures, care coordination (communications with other health  care professionals or caregivers) and documentation in the medical record.

## 2022-02-15 DIAGNOSIS — M549 Dorsalgia, unspecified: Secondary | ICD-10-CM | POA: Diagnosis not present

## 2022-02-15 DIAGNOSIS — M47816 Spondylosis without myelopathy or radiculopathy, lumbar region: Secondary | ICD-10-CM | POA: Diagnosis not present

## 2022-02-19 DIAGNOSIS — Z1231 Encounter for screening mammogram for malignant neoplasm of breast: Secondary | ICD-10-CM | POA: Diagnosis not present

## 2022-02-19 LAB — HM MAMMOGRAPHY

## 2022-02-21 ENCOUNTER — Other Ambulatory Visit: Payer: Self-pay | Admitting: Internal Medicine

## 2022-02-21 DIAGNOSIS — E782 Mixed hyperlipidemia: Secondary | ICD-10-CM

## 2022-02-21 MED ORDER — EZETIMIBE 10 MG PO TABS: ORAL_TABLET | ORAL | 3 refills | Status: DC

## 2022-02-24 ENCOUNTER — Other Ambulatory Visit: Payer: Self-pay

## 2022-02-24 ENCOUNTER — Encounter: Payer: Self-pay | Admitting: Internal Medicine

## 2022-02-24 ENCOUNTER — Ambulatory Visit (INDEPENDENT_AMBULATORY_CARE_PROVIDER_SITE_OTHER): Payer: Medicare Other | Admitting: Internal Medicine

## 2022-02-24 VITALS — BP 178/82 | HR 74 | Temp 97.3°F | Ht 62.0 in | Wt 185.2 lb

## 2022-02-24 DIAGNOSIS — R0989 Other specified symptoms and signs involving the circulatory and respiratory systems: Secondary | ICD-10-CM | POA: Diagnosis not present

## 2022-02-24 DIAGNOSIS — R109 Unspecified abdominal pain: Secondary | ICD-10-CM | POA: Diagnosis not present

## 2022-02-24 MED ORDER — OLMESARTAN MEDOXOMIL 40 MG PO TABS: ORAL_TABLET | ORAL | 3 refills | Status: DC

## 2022-02-24 NOTE — Progress Notes (Signed)
? ? ?Future Appointments  ?Date Time Provider Department  ?02/24/2022 11:30 AM [name], MD [facility]  ?05/16/2022              CPE  2:00 PM [name], MD [facility]  ?06/13/2022  3:20 PM Brunetta Genera, MD [facility]  ?09/29/2022            Wellness  4:00 PM Magda Bernheim, NP [facility]  ? ? ?History of Present Illness: ? ?      Patient is a very nice 76 yo MWF with HTN, HLD, CLL, Prediabetes,   GERD & Vitamin D Deficiency who returns for  1 month  f/u for evaluation of vague abd pain for which she was restarted on her Omreprazole. Patient also present list of recent BP's ranging systolics up to 408-144 and Diastolics 81-85'U. ? ? ?Medications ? ?  levothyroxine () 50 MCG tablet, Take 1 tablet daily. ?  bisoprolol (ZEBETA) 10 MG tablet, Take  1 tablet  Daily   ?  ezetimibe (ZETIA) 10 MG tablet, Take  1 tablet  Daily ?Current Outpatient Medications (Analgesics):  ?  acetaminophen (TYLENOL) 325 MG tablet, Take 650 mg by mouth every 6 (six) hours as needed for mild pain, fever or headache. ?  ALPRAZolam (XANAX) 1 MG tablet, TAKE 1/2 - 1 TABLET 2 - 3 X /DAY ONLY IF NEEDED FOR PANIC ATTACK/ANXIETY ATTACK & LIMIT TO 5 DAYS /WEEK TO AVOID ADDICTION & DEMENTIA ?  buPROPion (WELLBUTRIN XL) 150 MG 24 hr tablet, Take  1 tablet  Daily   ?  Cholecalciferol (VITAMIN D3) 250 MCG (10000 UT) TABS, Take 5,000 Units by mouth in the morning. ?  FLUoxetine (PROZAC) 20 MG capsule, TAKE 1 CAPSULE BY MOUTH DAILY  ?  Multiple Vitamins-Minerals (ZINC PO), Take 1 tablet by mouth daily. ?  omeprazole (PRILOSEC) 20 MG capsule, TAKE 1 CAPSULE BY MOUTH 30 MINUTES BEFORE BREAKFAST AND EVENING MEAL FOR 2 WEEKS. CONTINUE IF PAIN IS IMPROVED ?  ondansetron (ZOFRAN) 4 MG tablet, Take 1 tablet (4 mg total) by mouth every 8 (eight) hours as needed for nausea or vomiting. ?  RESTASIS 0.05 % ophthalmic emulsion, Place 1 drop into both eyes 2 (two) times daily as needed (for seasonal allergies/irritation).  ?  vitamin C (ASCORBIC ACID)  500 MG tablet, Take 500 mg by mouth daily. ? ?Problem list ?She has CLL (chronic lymphocytic leukemia) ([place]); Hepatitis A; Class 2 obesity due to excess calories with body mass index (BMI) of 37.0 to 37.9 in adult; Angioedema secondary to ACE/ARB; Abnormal glucose; Depression, major, in remission ([employer]); GERD (gastroesophageal reflux disease); Recurrent sinus infections; OSA (obstructive sleep apnea); HTN (hypertension); Vitamin D deficiency; Hypogammaglobulinemia ([place]); Mixed hyperlipidemia; Medication management; Environmental and seasonal allergies; BMI 37.85,   adult; Immunocompromised ([place]); Thrombocytopenia ([place]); S/P lumbar laminectomy; Counseling regarding advance care planning and goals of care; Chronic bronchitis, unspecified chronic bronchitis type ([place]); Atherosclerosis of aorta ([place]); 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 ?  ?Observations/Objective: ? ?BP  178/82  & confirmed   P  74   T 97.3 ?F    Ht '5\' 2"'$    Wt 185 lb 3.2 oz   SpO2   95%   BMI 33.87  ? ?HEENT - WNL. ?Neck - supple.  ?Chest - Clear equal BS. ?Cor - Nl HS. RRR w/o sig MGR. PP 1(+). No  edema. ?Abd - Soft. Benign w/o tenderness , masses  or organomegaly .  BS Nl.  ?MS- FROM w/o deformities.  Gait Nl. ?Neuro -  Nl w/o focal abnormalities. ? ? ?Assessment and Plan: ? ?1. Labile hypertension ? ?-  Add olmesartan  40 MG tablet;  ?Take 1 tablet Daily for BP   ?Dispense: 90 tablet; Refill: 3 ? ?- Call if BP's remain elevated over 145/90 ? ?2. Abdominal pain ? ?- CBC with Differential/Platelet ?- COMPLETE METABOLIC PANEL WITH GFR ? ? ?Follow Up Instructions: ? ?  ?    I discussed the assessment and treatment plan with the patient. The patient was provided an opportunity to ask questions and all were answered. The patient agreed with the  plan and demonstrated an understanding of the instructions. ?  ?    The patient was advised to call back or seek an in-person evaluation if the symptoms worsen or if the condition fails to improve as anticipated. ? ? ? ?Kirtland Bouchard, MD ? ? ?

## 2022-02-25 ENCOUNTER — Encounter: Payer: Self-pay | Admitting: Hematology

## 2022-02-25 LAB — CBC WITH DIFFERENTIAL/PLATELET
Absolute Monocytes: 490 cells/uL (ref 200–950)
Basophils Absolute: 75 cells/uL (ref 0–200)
Basophils Relative: 0.2 %
Eosinophils Absolute: 264 cells/uL (ref 15–500)
Eosinophils Relative: 0.7 %
HCT: 34.5 % — ABNORMAL LOW (ref 35.0–45.0)
Hemoglobin: 11.6 g/dL — ABNORMAL LOW (ref 11.7–15.5)
Lymphs Abs: 32686 cells/uL — ABNORMAL HIGH (ref 850–3900)
MCH: 34.2 pg — ABNORMAL HIGH (ref 27.0–33.0)
MCHC: 33.6 g/dL (ref 32.0–36.0)
MCV: 101.8 fL — ABNORMAL HIGH (ref 80.0–100.0)
MPV: 8.7 fL (ref 7.5–12.5)
Monocytes Relative: 1.3 %
Neutro Abs: 4185 cells/uL (ref 1500–7800)
Neutrophils Relative %: 11.1 %
Platelets: 71 10*3/uL — ABNORMAL LOW (ref 140–400)
RBC: 3.39 10*6/uL — ABNORMAL LOW (ref 3.80–5.10)
RDW: 13.8 % (ref 11.0–15.0)
Total Lymphocyte: 86.7 %
WBC: 37.7 10*3/uL — ABNORMAL HIGH (ref 3.8–10.8)

## 2022-02-25 LAB — COMPLETE METABOLIC PANEL WITH GFR
AG Ratio: 2.5 (calc) (ref 1.0–2.5)
ALT: 9 U/L (ref 6–29)
AST: 17 U/L (ref 10–35)
Albumin: 4.3 g/dL (ref 3.6–5.1)
Alkaline phosphatase (APISO): 61 U/L (ref 37–153)
BUN/Creatinine Ratio: 16 (calc) (ref 6–22)
BUN: 16 mg/dL (ref 7–25)
CO2: 24 mmol/L (ref 20–32)
Calcium: 9.4 mg/dL (ref 8.6–10.4)
Chloride: 102 mmol/L (ref 98–110)
Creat: 1.01 mg/dL — ABNORMAL HIGH (ref 0.60–1.00)
Globulin: 1.7 g/dL (calc) — ABNORMAL LOW (ref 1.9–3.7)
Glucose, Bld: 110 mg/dL — ABNORMAL HIGH (ref 65–99)
Potassium: 4.3 mmol/L (ref 3.5–5.3)
Sodium: 136 mmol/L (ref 135–146)
Total Bilirubin: 0.9 mg/dL (ref 0.2–1.2)
Total Protein: 6 g/dL — ABNORMAL LOW (ref 6.1–8.1)
eGFR: 58 mL/min/{1.73_m2} — ABNORMAL LOW (ref >=60)

## 2022-02-25 NOTE — Progress Notes (Signed)
<><><><><><><><><><><><><><><><><><><><><><><><><><><><><><><><><> ?<><><><><><><><><><><><><><><><><><><><><><><><><><><><><><><><><> ? ?-     CBC is "OK"  - Red Cell Count  ( Hbg) is Stable  - about the same  ? ?- WBC is elevated but about the same ?                                                      - actually much lower than even 4-5 months ago ! ?<><><><><><><><><><><><><><><><><><><><><><><><><><><><><><><><><> ?<><><><><><><><><><><><><><><><><><><><><><><><><><><><><><><><><> ? ?-  All Else - CBC - Kidneys - Electrolytes - Liver  - all  Normal / OK ? ?<><><><><><><><><><><><><><><><><><><><><><><><><><><><><><><><><> ?<><><><><><><><><><><><><><><><><><><><><><><><><><><><><><><><><> ? ? ? ? ? ? ? ? ? ? ? ? ? ? ? ?

## 2022-02-26 ENCOUNTER — Encounter: Payer: Self-pay | Admitting: Internal Medicine

## 2022-02-26 NOTE — Patient Instructions (Signed)

## 2022-02-28 ENCOUNTER — Other Ambulatory Visit: Payer: Self-pay | Admitting: Internal Medicine

## 2022-02-28 MED ORDER — NIFEDIPINE ER OSMOTIC RELEASE 30 MG PO TB24: 30.0000 mg | ORAL_TABLET | Freq: Every day | ORAL | 11 refills | Status: DC

## 2022-03-07 ENCOUNTER — Other Ambulatory Visit: Payer: Self-pay | Admitting: Nurse Practitioner

## 2022-03-08 ENCOUNTER — Encounter: Payer: Self-pay | Admitting: Internal Medicine

## 2022-03-08 ENCOUNTER — Ambulatory Visit: Payer: Medicare Other | Admitting: Internal Medicine

## 2022-03-15 ENCOUNTER — Ambulatory Visit: Payer: Medicare Other | Admitting: Internal Medicine

## 2022-03-16 ENCOUNTER — Ambulatory Visit: Payer: Medicare Other | Admitting: Internal Medicine

## 2022-03-16 ENCOUNTER — Encounter: Payer: Self-pay | Admitting: Internal Medicine

## 2022-03-16 VITALS — BP 132/73 | HR 119 | Temp 97.9°F | Resp 19 | Ht 62.0 in | Wt 186.2 lb

## 2022-03-16 DIAGNOSIS — N1831 Chronic kidney disease, stage 3a: Secondary | ICD-10-CM | POA: Diagnosis not present

## 2022-03-16 DIAGNOSIS — R0989 Other specified symptoms and signs involving the circulatory and respiratory systems: Secondary | ICD-10-CM | POA: Diagnosis not present

## 2022-03-16 NOTE — Progress Notes (Signed)
? ? ?Future Appointments  ?Date Time Provider Department  ?03/16/2022  4:00 PM [name], MD [facility]  ?05/16/2022        CPE  2:00 PM [name], MD [facility]  ?06/13/2022  3:20 PM Brunetta Genera, MD [place]  ?09/29/2022       Wellness  4:00 PM Magda Bernheim, NP [facility]  ? ? ?History of Present Illness: ? ? ?    Patient is a very nice 76 yo MWF with HTN, HLD, CLL, Prediabetes,   GERD & Vitamin D Deficiency  who returns for 2 week f/u of  elevated BP 178/82  and adding Olmesartan 40 mg  with her Bisoprolol 10 mg . ? ?    Patient reports BP's have been normal to high normal & pulses have been occasionally running 110-120 range. Apparently , she mis understood last OV & stopped he bisoprolol Hypertensive systems review is negative for HA, dizziness, CP, palpitations dyspnea or edema.    ? ? ?Medications ? ?  levothyroxine  50 MCG tablet, Take 1 tablet daily. ?   ( not taking)  ?  ezetimibe (ZETIA) 10 MG tablet, Take  1 tablet  Daily  ?  NIFEdipine -XL 30 MG 24 hr tablet, Take 1 tablet  daily. ?  acetaminophen 325 MG tablet, Take 650 mg every 6 hours as needed f ?  ALPRAZolam 1 MG tablet, TAKE 1/2-1 TABLET 2-3 TIMES DAILY ONLY IF NEEDED  ?  buPROPion XL) 150 MG 24 hr tablet, Take  1 tablet  Daily   ?  VITAMIN D , Take 5,000 Units by mouth in the morning. ?  FLUoxetine 20 MG , TAKE 1 CAPSULE DAILY FOR MOOD  ?  omeprazole  20 MG capsule, TAKE 1 CAPSULE 30 MINUTES BEFORE BREAKFAST AND EVENING MEAL FOR 2 WEEKS. CONTINUE IF PAIN IS IMPROVED ?  RESTASIS 0.05 % ophth  , Place 1 drop into both eyes 2 (two) times daily as needed  ?  vitamin C  500 MG tablet, Take daily. ?  zinc 50 MG tablet, Take  daily. ? ?Problem list ?She has CLL (chronic lymphocytic leukemia) ([place]); Hepatitis A; Class 2 obesity due to excess calories with body mass index (BMI) of 37.0 to 37.9 in adult; Angioedema secondary to ACE/ARB; Abnormal glucose; Depression, major, in remission ([place]); GERD (gastroesophageal reflux disease); Recurrent sinus  infections; OSA (obstructive sleep apnea); HTN (hypertension); Vitamin D deficiency; Hypogammaglobulinemia ([place]); Mixed hyperlipidemia; Medication management; Environmental and seasonal allergies; BMI 37.85,   adult; Immunocompromised ([place]); Thrombocytopenia ([place]); S/P lumbar laminectomy; Counseling regarding advance care planning and goals of care; Chronic bronchitis, unspecified chronic bronchitis type ([place]); Atherosclerosis of aorta ([place]); 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 ?  ?Observations/Objective: ? ?BP 132/73 Comment: 140/80  Pulse (!) 119 Comment: 108  Temp 97.9 ?F (36.6 ?C)   Resp 19   Ht '5\' 2"'$  (1.575 m)   Wt 186 lb 3.2 oz (84.5 kg)   SpO2 97%   BMI 34.06 kg/m?  ? ?HEENT - WNL. ?Neck - supple.  ?Chest - Clear equal BS. ?Cor - Nl HS. RRR w/o sig MGR. PP 1(+). No edema. ?MS- FROM w/o deformities.  Gait Nl. ?Neuro -  Nl w/o focal abnormalities. ? ?Assessment and Plan: ? ? ?1. Labile hypertension ? ?- advised restart her Bisoprolol and continue monitor BP & pulses.  ? ?  2. Chronic renal failure, stage 3a ([place]) ?\ ? ? ?Follow Up Instructions: ? ?  ?    I discussed the assessment and treatment plan with the patient. The patient was provided an opportunity to ask questions and all were answered. The patient agreed with the plan and demonstrated an understanding of the instructions. ?  ?    The patient was advised to call back or seek an in-person evaluation if the symptoms worsen or if the condition fails to improve as anticipated. ? ? ? ?Kirtland Bouchard, MD ? ?

## 2022-04-07 ENCOUNTER — Other Ambulatory Visit: Payer: Self-pay | Admitting: Adult Health

## 2022-04-07 ENCOUNTER — Other Ambulatory Visit: Payer: Self-pay | Admitting: Internal Medicine

## 2022-05-09 ENCOUNTER — Other Ambulatory Visit: Payer: Self-pay | Admitting: Adult Health

## 2022-05-09 DIAGNOSIS — F325 Major depressive disorder, single episode, in full remission: Secondary | ICD-10-CM

## 2022-05-16 ENCOUNTER — Encounter: Payer: Medicare Other | Admitting: Internal Medicine

## 2022-06-08 DIAGNOSIS — M47816 Spondylosis without myelopathy or radiculopathy, lumbar region: Secondary | ICD-10-CM | POA: Diagnosis not present

## 2022-06-08 DIAGNOSIS — M67811 Other specified disorders of synovium, right shoulder: Secondary | ICD-10-CM | POA: Diagnosis not present

## 2022-06-13 ENCOUNTER — Ambulatory Visit: Payer: Medicare Other | Admitting: Hematology

## 2022-06-13 ENCOUNTER — Other Ambulatory Visit: Payer: Medicare Other

## 2022-06-14 DIAGNOSIS — M47816 Spondylosis without myelopathy or radiculopathy, lumbar region: Secondary | ICD-10-CM | POA: Diagnosis not present

## 2022-06-14 DIAGNOSIS — M47814 Spondylosis without myelopathy or radiculopathy, thoracic region: Secondary | ICD-10-CM | POA: Diagnosis not present

## 2022-06-15 ENCOUNTER — Encounter: Payer: Medicare Other | Admitting: Internal Medicine

## 2022-06-21 ENCOUNTER — Ambulatory Visit: Payer: Medicare Other | Attending: Family Medicine | Admitting: Physical Therapy

## 2022-06-21 ENCOUNTER — Encounter: Payer: Self-pay | Admitting: Physical Therapy

## 2022-06-21 DIAGNOSIS — M5459 Other low back pain: Secondary | ICD-10-CM | POA: Diagnosis not present

## 2022-06-21 DIAGNOSIS — R2689 Other abnormalities of gait and mobility: Secondary | ICD-10-CM | POA: Diagnosis not present

## 2022-06-21 DIAGNOSIS — M6281 Muscle weakness (generalized): Secondary | ICD-10-CM | POA: Insufficient documentation

## 2022-06-21 DIAGNOSIS — R2681 Unsteadiness on feet: Secondary | ICD-10-CM | POA: Diagnosis not present

## 2022-06-21 NOTE — Therapy (Signed)
OUTPATIENT PHYSICAL THERAPY NEURO EVALUATION   Patient Name: Rebecca Bond MRN: [id number] DOB:02-Apr-1946, 76 y.o., female Today's Date: 06/21/2022   PCP: [name], MD REFERRING PROVIDER: Gentry Fitz, MD    PT End of Session - 06/21/22 1947     Visit Number 1    Number of Visits 17    Date for PT Re-Evaluation 08/19/22    Authorization Type BCBS Medicare    Authorization Time Period 06-21-22 - 09-03-22    PT Start Time 1148    PT Stop Time 1230    PT Time Calculation (min) 42 min    Activity Tolerance Patient tolerated treatment well    Behavior During Therapy [facility] for tasks assessed/performed             Past Medical History:  Diagnosis Date   Anemia    Anxiety    Arthritis    CLL (chronic lymphocytic leukemia) ([place]) dx'd ~ 2015   Depression    GERD (gastroesophageal reflux disease)    Headache    "sinus headaches"   History of kidney stones    Hypertension    OSA (obstructive sleep apnea)    Osteoarthritis    Pneumonia    Prediabetes    Recurrent sinus infections 08/02/2012   Tubular adenoma of colon    Past Surgical History:  Procedure Laterality Date   ABDOMINAL HYSTERECTOMY  1980's   "endometrosis"   APPENDECTOMY     BREAST SURGERY     CATARACT EXTRACTION, BILATERAL     CHOLECYSTECTOMY  1985   FUNCTIONAL ENDOSCOPIC SINUS SURGERY  1990's   "cause I kept having sinus infections"   immunoglobulin treatment  2017   LAPAROSCOPIC APPENDECTOMY N/A 06/04/2014   Procedure: APPENDECTOMY LAPAROSCOPIC;  Surgeon: Zenovia Jarred, MD;  Location: [facility];  Service: General;  Laterality: N/A;   LUMBAR LAMINECTOMY/DECOMPRESSION MICRODISCECTOMY Left 11/01/2017   Procedure: Left Lumbar Four-Five Extraforaminal Microdiscectomy;  Surgeon: Eustace Moore, MD;  Location: [facility];  Service: Neurosurgery;  Laterality: Left;   LYMPH NODE BIOPSY     "determined I had CLL"   SHOULDER ARTHROSCOPY Right    TEMPOROMANDIBULAR JOINT ARTHROPLASTY  1980's    TONSILLECTOMY AND ADENOIDECTOMY  1950's   Patient Active Problem List   Diagnosis Date Noted   Fatty liver 02/01/2022   Pneumonia due to COVID-19 virus 09/08/2021   Norovirus 04/10/2020   Acute gastroenteritis 04/08/2020   AKI (acute kidney injury) ([place]) 04/08/2020   Anxiety with depression 04/08/2020   Hyperbilirubinemia 04/08/2020   Hypocalcemia 04/08/2020   Hypomagnesemia 04/08/2020   Chronic bronchitis, unspecified chronic bronchitis type ([place]) 03/12/2020   Atherosclerosis of aorta ([place]) 03/12/2020   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[place]) 03/12/2020   Counseling regarding advance care planning and goals of care 12/16/2018   S/P lumbar laminectomy 11/01/2017   Thrombocytopenia ([place]) 05/02/2016   Immunocompromised ([place]) 10/10/2015   BMI 37.85,   adult 09/23/2015   Environmental and seasonal allergies 08/15/2015   Mixed hyperlipidemia 06/01/2015   Medication management 06/01/2015   Hypogammaglobulinemia ([place]) 12/21/2014   Vitamin D deficiency 09/30/2014   HTN (hypertension) 01/08/2014   Abnormal glucose    Depression, major, in remission ([facility])    GERD (gastroesophageal reflux disease)    OSA (obstructive sleep apnea)    Angioedema secondary to ACE/ARB 12/25/2013   Class 2 obesity due to excess calories with body mass index (BMI) of 37.0 to 37.9 in adult 12/17/2013   Hepatitis A 08/02/2012   Recurrent sinus infections 08/02/2012  CLL (chronic lymphocytic leukemia) ([place]) 11/22/2011    ONSET DATE: Referral date 06-10-22  REFERRING DIAG: Lumbar spondylosis  THERAPY DIAG:  Other low back pain - Plan: PT plan of care cert/re-cert  Other abnormalities of gait and mobility - Plan: PT plan of care cert/re-cert  Muscle weakness (generalized) - Plan: PT plan of care cert/re-cert  Unsteadiness on feet - Plan: PT plan of care cert/re-cert  Rationale for Evaluation and Treatment Rehabilitation  Subjective:  Pt reports she had 4 injections last Tuesday, July 11; had been 4 months prior  to that since she received other injections for back pain;  pt states she is unable to stand for prolonged time period without having severe back pain.  Pt states she has never used any device for assistance with ambulation; walks slower because of the back pain.   Pt accompanied by: family member (waiting in lobby)  PERTINENT HISTORY: LUMBAR LAMINECTOMY/DECOMPRESSION MICRODISCECTOMYLeft11/28/2018, CLL (chronic lymphocytic leukemia, HTN, depression  PAIN  Are you having pain? Yes: NPRS scale: 5/10 Pain location: low back  Pain description: soreness, aching, throbbing Aggravating factors: standing, walking Relieving factors: sitting or lying down; non-weightbearing positions   States pain varies in intensity depending on weightbearing - reports intensity will increase to 8-9/10 with weight-bearing  PRECAUTIONS: None  WEIGHT BEARING RESTRICTIONS No  FALLS: Has patient fallen in last 6 months? No  LIVING ENVIRONMENT: Lives with: lives with their spouse Lives in: House/apartment Stairs: Yes: Internal: 12 steps; on right going up and External: 2 steps; none Has following equipment at home: None  PLOF: Independent with basic ADLs, Independent with household mobility without device, Independent with community mobility without device, Independent with gait, Independent with transfers, and Needs assistance with homemaking  PATIENT GOALS I want to be able to walk and gain strength in arms and legs; be able to play with great-grandson more  OBJECTIVE:   DIAGNOSTIC FINDINGS: N/A  COGNITION: Overall cognitive status: Within functional limits for tasks assessed   SENSATION: WFL  COORDINATION: WFL's    POSTURE: rounded shoulders, forward head, and increased thoracic kyphosis  LOWER EXTREMITY ROM:   WFL's  Lt shoulder flexion and abduction strength 4/5 Rt shoulder flexion to approx. 100 degrees ; strength 3+/5 in available range   LOWER EXTREMITY MMT:    MMT Right Eval  Left Eval  Hip flexion 4 3+  Hip extension    Hip abduction    Hip adduction    Hip internal rotation    Hip external rotation    Knee flexion 4 4  Knee extension 5 5  Ankle dorsiflexion 5 5  Ankle plantarflexion    Ankle inversion    Ankle eversion    (Blank rows = not tested)  BED MOBILITY:  Independent  TRANSFERS: Assistive device utilized: None  Sit to stand: Modified independence Stand to sit: Modified independence  GAIT: Gait pattern: step through pattern and antalgic Distance walked: 350' in 2" 51 secs Assistive device utilized: None Level of assistance: SBA Comments: slow gait speed due to back pain; pt fatigued after amb. 2"51 secs nonstop without device Gait velocity = 25.22 secs = 1.30 ft/sec without device  FUNCTIONAL TESTs:  5 times sit to stand: 20.75 secs without UE support - from standard chair Timed up and go (TUG): 15.62 secs  3 minute walk test: 350' in 2"51 secs no device Balance tests; Romberg EO and EC 30 secs RLE SLS 3.06 secs:  LLE SLS 3.31 secs Tandem stance 30 secs  PATIENT EDUCATION:  Education details: Eval results; benefit of aquatic exercise Person educated: Patient Education method: Explanation Education comprehension: verbalized understanding   HOME EXERCISE PROGRAM: To be issued     GOALS: Goals reviewed with patient? Yes  SHORT TERM GOALS: Target date: 07/22/2022  Improve TUG score to </= 13.0 secs without device for reduced fall risk. Baseline:  15.62 secs Goal status: INITIAL  2.  Increase gait velocity to >/= 1.70 ft/sec without device for increased gait efficiency. Baseline: 1.30 ft/sec (25.22 secs) Goal status: INITIAL  3.  Pt will improve 5 times sit to stand score to </= 17 secs without UE support to demo increased LE strength.  Baseline: 20.75 secs without UE support Goal status: INITIAL  4.  Pt will amb. 5" nonstop without device with c/o back pain </= 5/10 for increased activity tolerance. Baseline: 2" 51  secs 350'  Goal status: INITIAL  5.  Pt will report ability to stand for at least 15" to perform kitchen/cooking activity with c/o back pain </= 6/10. Baseline: 5" standing - back pain 8/10 intensity Goal status: INITIAL  6.  Independent in HEP for LE strengthening and balance exercises, including walking program. Baseline:  Goal status: INITIAL  LONG TERM GOALS: Target date: 08/19/2022  Improve TUG score to </= 11.0 secs without device for reduced fall risk. Baseline:  15.62 secs Goal status: INITIAL  2.  Increase gait velocity to >/= 2.0 ft/sec without device for increased gait efficiency. Baseline: 1.30 ft/sec (25.22 secs) Goal status: INITIAL  3.  Pt will amb. 10" nonstop without device with c/o back pain </= 4/10 for increased activity tolerance. Baseline: 2" 51 secs 350'  Goal status: INITIAL  4.  Pt will report ability to stand for at least 30" to perform kitchen/cooking activity with c/o back pain </= 5/10. Baseline: 5" standing - back pain 8/10 intensity Goal status: INITIAL  5.  Perform floor to stand transfer with UE support with SBA Baseline: To be assessed Goal status: INITIAL  6.  Independent in HEP including strengthening exercises and aquatic exercises to be continued upon discharge. Baseline: To be established Goal status: INITIAL  ASSESSMENT:  CLINICAL IMPRESSION: Patient is a 76 y.o. lady who was seen today for physical therapy evaluation and treatment for LE weakness, gait and balance deficits due to lumbar spondylosis.   Pt presents with LE weakness with LLE slightly weaker than RLE with greater proximal weakness than distal.  Pt is unable to stand > 5" without moderate to severe back pain and is unable to amb. prolonged distances due to back pain.  Pt has decreased gait speed with velocity 1.30 ft/sec without use of device; states she walks slower to reduce back pain.  Pt is at fall risk per TUG score of 15.62 secs.  Pt is unable to perform SLS >3 secs on  either leg.  Pt able to amb. 3" prior to fatigue and needing seated rest break.  Pt will benefit from land and aquatic based PT to address bil. LE weakness, gait and balance deficits and decreased endurance/activity tolerance.    OBJECTIVE IMPAIRMENTS decreased activity tolerance, decreased balance, decreased endurance, difficulty walking, decreased strength, and pain.   ACTIVITY LIMITATIONS carrying, lifting, bending, standing, squatting, stairs, and locomotion level  PARTICIPATION LIMITATIONS: meal prep, cleaning, laundry, shopping, and community activity  PERSONAL FACTORS Age, Fitness, Past/current experiences, and 1 comorbidity: chronic low back pain  are also affecting patient's functional outcome.   REHAB POTENTIAL: Good  CLINICAL DECISION MAKING: Evolving/moderate complexity  EVALUATION COMPLEXITY: Moderate  PLAN: PT FREQUENCY: 2x/week  PT DURATION: 8 weeks  PLANNED INTERVENTIONS: Therapeutic exercises, Therapeutic activity, Neuromuscular re-education, Balance training, Gait training, Patient/Family education, Self Care, Stair training, DME instructions, and Aquatic Therapy  PLAN FOR NEXT SESSION: Aquatic therapy to start 06-29-22:  begin LE strengthening exs on land; issue HEP    Alda Lea, PT 06/21/2022, 9:15 PM

## 2022-06-22 ENCOUNTER — Ambulatory Visit (HOSPITAL_BASED_OUTPATIENT_CLINIC_OR_DEPARTMENT_OTHER): Payer: Medicare Other | Admitting: Physical Therapy

## 2022-06-27 ENCOUNTER — Other Ambulatory Visit: Payer: Self-pay | Admitting: Oncology

## 2022-06-27 ENCOUNTER — Other Ambulatory Visit: Payer: Self-pay

## 2022-06-28 ENCOUNTER — Ambulatory Visit: Payer: Medicare Other | Admitting: Physical Therapy

## 2022-06-29 ENCOUNTER — Ambulatory Visit: Payer: Medicare Other | Admitting: Physical Therapy

## 2022-07-07 ENCOUNTER — Ambulatory Visit: Payer: Medicare Other | Admitting: Physical Therapy

## 2022-07-07 ENCOUNTER — Other Ambulatory Visit: Payer: Self-pay | Admitting: Nurse Practitioner

## 2022-07-11 ENCOUNTER — Ambulatory Visit: Payer: Medicare Other | Attending: Family Medicine | Admitting: Physical Therapy

## 2022-07-11 ENCOUNTER — Encounter: Payer: Self-pay | Admitting: Physical Therapy

## 2022-07-11 DIAGNOSIS — R2681 Unsteadiness on feet: Secondary | ICD-10-CM | POA: Diagnosis not present

## 2022-07-11 DIAGNOSIS — R2689 Other abnormalities of gait and mobility: Secondary | ICD-10-CM | POA: Diagnosis not present

## 2022-07-11 DIAGNOSIS — M6281 Muscle weakness (generalized): Secondary | ICD-10-CM

## 2022-07-11 DIAGNOSIS — M5459 Other low back pain: Secondary | ICD-10-CM

## 2022-07-11 NOTE — Therapy (Addendum)
OUTPATIENT PHYSICAL THERAPY NEURO TREATMENT   Patient Name: Rebecca Bond MRN: [id number] DOB:03-Jun-1946, 76 y.o., female Today's Date: 07/11/2022   PCP: [name], MD REFERRING PROVIDER: Gentry Fitz, MD    PT End of Session - 07/11/22 1711     Visit Number 2    Number of Visits 17    Date for PT Re-Evaluation 08/19/22    Authorization Type BCBS Medicare    Authorization Time Period 06-21-22 - 09-03-22    PT Start Time 1315    PT Stop Time 1355    PT Time Calculation (min) 40 min    Activity Tolerance Patient tolerated treatment well    Behavior During Therapy [facility] for tasks assessed/performed             Past Medical History:  Diagnosis Date   Anemia    Anxiety    Arthritis    CLL (chronic lymphocytic leukemia) ([place]) dx'd ~ 2015   Depression    GERD (gastroesophageal reflux disease)    Headache    "sinus headaches"   History of kidney stones    Hypertension    OSA (obstructive sleep apnea)    Osteoarthritis    Pneumonia    Prediabetes    Recurrent sinus infections 08/02/2012   Tubular adenoma of colon    Past Surgical History:  Procedure Laterality Date   ABDOMINAL HYSTERECTOMY  1980's   "endometrosis"   APPENDECTOMY     BREAST SURGERY     CATARACT EXTRACTION, BILATERAL     CHOLECYSTECTOMY  1985   FUNCTIONAL ENDOSCOPIC SINUS SURGERY  1990's   "cause I kept having sinus infections"   immunoglobulin treatment  2017   LAPAROSCOPIC APPENDECTOMY N/A 06/04/2014   Procedure: APPENDECTOMY LAPAROSCOPIC;  Surgeon: Zenovia Jarred, MD;  Location: [place];  Service: General;  Laterality: N/A;   LUMBAR LAMINECTOMY/DECOMPRESSION MICRODISCECTOMY Left 11/01/2017   Procedure: Left Lumbar Four-Five Extraforaminal Microdiscectomy;  Surgeon: Eustace Moore, MD;  Location: [place];  Service: Neurosurgery;  Laterality: Left;   LYMPH NODE BIOPSY     "determined I had CLL"   SHOULDER ARTHROSCOPY Right    TEMPOROMANDIBULAR JOINT ARTHROPLASTY  1980's    TONSILLECTOMY AND ADENOIDECTOMY  1950's   Patient Active Problem List   Diagnosis Date Noted   Fatty liver 02/01/2022   Pneumonia due to COVID-19 virus 09/08/2021   Norovirus 04/10/2020   Acute gastroenteritis 04/08/2020   AKI (acute kidney injury) ([place]) 04/08/2020   Anxiety with depression 04/08/2020   Hyperbilirubinemia 04/08/2020   Hypocalcemia 04/08/2020   Hypomagnesemia 04/08/2020   Chronic bronchitis, unspecified chronic bronchitis type ([place]) 03/12/2020   Atherosclerosis of aorta ([place]) 03/12/2020   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   Counseling regarding advance care planning and goals of care 12/16/2018   S/P lumbar laminectomy 11/01/2017   Thrombocytopenia ([place]) 05/02/2016   Immunocompromised ([place]) 10/10/2015   BMI 37.85,   adult 09/23/2015   Environmental and seasonal allergies 08/15/2015   Mixed hyperlipidemia 06/01/2015   Medication management 06/01/2015   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   Vitamin D deficiency 09/30/2014   HTN (hypertension) 01/08/2014   Abnormal glucose    Depression, major, in remission (HCC)    GERD (gastroesophageal reflux disease)    OSA (obstructive sleep apnea)    Angioedema secondary to ACE/ARB 12/25/2013   Class 2 obesity due to excess calories with body mass index (BMI) of 37.0 to 37.9 in adult 12/17/2013   Hepatitis A 08/02/2012   Recurrent sinus infections 08/02/2012  CLL (chronic lymphocytic leukemia) ([place]) 11/22/2011    ONSET DATE: Referral date 06-10-22  REFERRING DIAG: Lumbar spondylosis  THERAPY DIAG:  Other low back pain  Other abnormalities of gait and mobility  Muscle weakness (generalized)  Unsteadiness on feet  Rationale for Evaluation and Treatment Rehabilitation  PERTINENT HISTORY: LUMBAR LAMINECTOMY/DECOMPRESSION MICRODISCECTOMYLeft11/28/2018, CLL (chronic lymphocytic leukemia, HTN, depression  PRECAUTIONS: None  PATIENT GOALS I want to be able to walk and gain strength in arms and legs; be able  to play with great-grandson more   SUBJECTIVE: No falls. Tried to go shopping at [facility] for bathing suit and after being in section for 5 minutes her back was "killing" her. Went home and laid on a hot pack.  Pt accompanied by: spouse  PAIN Are you having pain? Yes: NPRS scale: 6-7/10 Pain location: low back  Pain description: soreness, aching, throbbing Aggravating factors: standing, walking Relieving factors: sitting or lying down; non-weightbearing positions   States pain varies in intensity depending on weightbearing - reports intensity will increase to 8-9/10 with weight-bearing   TODAY'S TREATMENT: Aquatic therapy at [facility] - pool temp 90 degrees   Patient seen for aquatic therapy today.  Treatment took place in water 3.5-4.5 feet deep depending upon activity.  Pt entered/exited pool via stairs with bil rails.   Gait with large bar bell across 18 feet in ~3.5-3.8 foot water depth with min assist for balance.  Forward x 8 laps Backward x 8 laps Side stepping left<>right x 4 laps each way  At wall in ~4.0 foot water depth with UE support Heel<>toe raises Alternating marching Alternating HS curls Alternating hip abd/add Alternating hip ex   Seated on bench in water Long arc quads x 10 reps each side Hip abd/add with LE's extended out in water x 10 reps each side      Pt requires buoyancy of water for support for reduced fall risk with gait training and balance exercises with minimal UE support; exercises able to be performed safely in water without the risk of fall compared to those same exercises performed on land;  viscosity of water needed for resistance for strengthening.  Current of water provides perturbations for challenging static & dynamic standing balance.       PATIENT EDUCATION: Education details: aquatic gait and ex's Person educated: Patient Education method: Explanation Education comprehension: verbalized understanding   HOME EXERCISE  PROGRAM: To be issued     GOALS: Goals reviewed with patient? Yes  SHORT TERM GOALS: Target date: 07/22/2022  Improve TUG score to </= 13.0 secs without device for reduced fall risk. Baseline:  15.62 secs Goal status: INITIAL  2.  Increase gait velocity to >/= 1.70 ft/sec without device for increased gait efficiency. Baseline: 1.30 ft/sec (25.22 secs) Goal status: INITIAL  3.  Pt will improve 5 times sit to stand score to </= 17 secs without UE support to demo increased LE strength.  Baseline: 20.75 secs without UE support Goal status: INITIAL  4.  Pt will amb. 5" nonstop without device with c/o back pain </= 5/10 for increased activity tolerance. Baseline: 2" 51 secs 350'  Goal status: INITIAL  5.  Pt will report ability to stand for at least 15" to perform kitchen/cooking activity with c/o back pain </= 6/10. Baseline: 5" standing - back pain 8/10 intensity Goal status: INITIAL  6.  Independent in HEP for LE strengthening and balance exercises, including walking program. Baseline:  Goal status: INITIAL  LONG TERM GOALS: Target date: 08/19/2022  Improve TUG score to </= 11.0 secs without device for reduced fall risk. Baseline:  15.62 secs Goal status: INITIAL  2.  Increase gait velocity to >/= 2.0 ft/sec without device for increased gait efficiency. Baseline: 1.30 ft/sec (25.22 secs) Goal status: INITIAL  3.  Pt will amb. 10" nonstop without device with c/o back pain </= 4/10 for increased activity tolerance. Baseline: 2" 51 secs 350'  Goal status: INITIAL  4.  Pt will report ability to stand for at least 30" to perform kitchen/cooking activity with c/o back pain </= 5/10. Baseline: 5" standing - back pain 8/10 intensity Goal status: INITIAL  5.  Perform floor to stand transfer with UE support with SBA Baseline: To be assessed Goal status: INITIAL  6.  Independent in HEP including strengthening exercises and aquatic exercises to be continued upon  discharge. Baseline: To be established Goal status: INITIAL  ASSESSMENT:  CLINICAL IMPRESSION:  Pt presented today for first aquatic session. No issues noted or reported with session. Pt does need cues to relax the tension in her shoulder with session. Did report feeling less pain while in the water. The pt should benefit from continued PT to progress toward unmet goals.   OBJECTIVE IMPAIRMENTS decreased activity tolerance, decreased balance, decreased endurance, difficulty walking, decreased strength, and pain.   ACTIVITY LIMITATIONS carrying, lifting, bending, standing, squatting, stairs, and locomotion level  PARTICIPATION LIMITATIONS: meal prep, cleaning, laundry, shopping, and community activity  PERSONAL FACTORS Age, Fitness, Past/current experiences, and 1 comorbidity: chronic low back pain  are also affecting patient's functional outcome.   REHAB POTENTIAL: Good  CLINICAL DECISION MAKING: Evolving/moderate complexity  EVALUATION COMPLEXITY: Moderate  PLAN: PT FREQUENCY: 2x/week  PT DURATION: 8 weeks  PLANNED INTERVENTIONS: Therapeutic exercises, Therapeutic activity, Neuromuscular re-education, Balance training, Gait training, Patient/Family education, Self Care, Stair training, DME instructions, and Aquatic Therapy  PLAN FOR NEXT SESSION:  Continue with aquatic therapy  begin LE strengthening exs on land; issue HEP    Willow Ora, PTA, [place] [address] [place], [place] [address] [phone] 07/11/22, 5:12 PM

## 2022-07-12 ENCOUNTER — Ambulatory Visit: Payer: Medicare Other | Admitting: Physical Therapy

## 2022-07-12 DIAGNOSIS — R2689 Other abnormalities of gait and mobility: Secondary | ICD-10-CM

## 2022-07-12 DIAGNOSIS — M5459 Other low back pain: Secondary | ICD-10-CM | POA: Diagnosis not present

## 2022-07-12 DIAGNOSIS — R2681 Unsteadiness on feet: Secondary | ICD-10-CM | POA: Diagnosis not present

## 2022-07-12 DIAGNOSIS — M6281 Muscle weakness (generalized): Secondary | ICD-10-CM

## 2022-07-12 NOTE — Therapy (Unsigned)
OUTPATIENT PHYSICAL THERAPY NEURO EVALUATION   Patient Name: Rebecca Bond MRN: [id number] DOB:Jan 23, 1946, 76 y.o., female Today's Date: 07/13/2022   PCP: [name], MD REFERRING PROVIDER: Gentry Fitz, MD    PT End of Session - 07/13/22 1140     Visit Number 3    Number of Visits 17    Date for PT Re-Evaluation 08/19/22    Authorization Type BCBS Medicare    Authorization Time Period 06-21-22 - 09-03-22    PT Start Time 1315    PT Stop Time 1400    PT Time Calculation (min) 45 min    Activity Tolerance Patient tolerated treatment well    Behavior During Therapy [facility] for tasks assessed/performed              Past Medical History:  Diagnosis Date   Anemia    Anxiety    Arthritis    CLL (chronic lymphocytic leukemia) ([place]) dx'd ~ 2015   Depression    GERD (gastroesophageal reflux disease)    Headache    "sinus headaches"   History of kidney stones    Hypertension    OSA (obstructive sleep apnea)    Osteoarthritis    Pneumonia    Prediabetes    Recurrent sinus infections 08/02/2012   Tubular adenoma of colon    Past Surgical History:  Procedure Laterality Date   ABDOMINAL HYSTERECTOMY  1980's   "endometrosis"   APPENDECTOMY     BREAST SURGERY     CATARACT EXTRACTION, BILATERAL     CHOLECYSTECTOMY  1985   FUNCTIONAL ENDOSCOPIC SINUS SURGERY  1990's   "cause I kept having sinus infections"   immunoglobulin treatment  2017   LAPAROSCOPIC APPENDECTOMY N/A 06/04/2014   Procedure: APPENDECTOMY LAPAROSCOPIC;  Surgeon: Zenovia Jarred, MD;  Location: [place];  Service: General;  Laterality: N/A;   LUMBAR LAMINECTOMY/DECOMPRESSION MICRODISCECTOMY Left 11/01/2017   Procedure: Left Lumbar Four-Five Extraforaminal Microdiscectomy;  Surgeon: Eustace Moore, MD;  Location: [place];  Service: Neurosurgery;  Laterality: Left;   LYMPH NODE BIOPSY     "determined I had CLL"   SHOULDER ARTHROSCOPY Right    TEMPOROMANDIBULAR JOINT ARTHROPLASTY  1980's    TONSILLECTOMY AND ADENOIDECTOMY  1950's   Patient Active Problem List   Diagnosis Date Noted   Fatty liver 02/01/2022   Pneumonia due to COVID-19 virus 09/08/2021   Norovirus 04/10/2020   Acute gastroenteritis 04/08/2020   AKI (acute kidney injury) ([place]) 04/08/2020   Anxiety with depression 04/08/2020   Hyperbilirubinemia 04/08/2020   Hypocalcemia 04/08/2020   Hypomagnesemia 04/08/2020   Chronic bronchitis, unspecified chronic bronchitis type ([place]) 03/12/2020   Atherosclerosis of aorta ([place]) 03/12/2020   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   Counseling regarding advance care planning and goals of care 12/16/2018   S/P lumbar laminectomy 11/01/2017   Thrombocytopenia ([place]) 05/02/2016   Immunocompromised ([place]) 10/10/2015   BMI 37.85,   adult 09/23/2015   Environmental and seasonal allergies 08/15/2015   Mixed hyperlipidemia 06/01/2015   Medication management 06/01/2015   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   Vitamin D deficiency 09/30/2014   HTN (hypertension) 01/08/2014   Abnormal glucose    Depression, major, in remission (HCC)    GERD (gastroesophageal reflux disease)    OSA (obstructive sleep apnea)    Angioedema secondary to ACE/ARB 12/25/2013   Class 2 obesity due to excess calories with body mass index (BMI) of 37.0 to 37.9 in adult 12/17/2013   Hepatitis A 08/02/2012   Recurrent sinus infections 08/02/2012  CLL (chronic lymphocytic leukemia) ([place]) 11/22/2011    ONSET DATE: Referral date 06-10-22  REFERRING DIAG: Lumbar spondylosis  THERAPY DIAG:  Other low back pain  Muscle weakness (generalized)  Other abnormalities of gait and mobility  Rationale for Evaluation and Treatment Rehabilitation  Subjective:  Pt reports she really like the pool exercise yesterday - states she tensed up in her shoulders but was able to relax somewhat by end of session.  Pt reports she is very jittery/shaky today - wonders if her potassium may be low; states she is going to  call her PCP when she gets home.  Says she had to stop a couple of times when amb. From car into clinic due to fatigue and shakiness - was holding her husband's arm.  Pt is not using an assistive device today.  Pt accompanied by: family member (waiting in lobby)  PERTINENT HISTORY: LUMBAR LAMINECTOMY/DECOMPRESSION MICRODISCECTOMYLeft11/28/2018, CLL (chronic lymphocytic leukemia, HTN, depression  PAIN  Soreness and no true pain  - in low back and legs  Are you having pain? Yes: NPRS scale: 5/10 Pain location: low back  Pain description: soreness, aching, throbbing Aggravating factors: standing, walking Relieving factors: sitting or lying down; non-weightbearing positions   States pain varies in intensity depending on weightbearing - reports intensity will increase to 8-9/10 with weight-bearing  PRECAUTIONS: None  WEIGHT BEARING RESTRICTIONS No  FALLS: Has patient fallen in last 6 months? No  LIVING ENVIRONMENT: Lives with: lives with their spouse Lives in: House/apartment Stairs: Yes: Internal: 12 steps; on right going up and External: 2 steps; none Has following equipment at home: None  PLOF: Independent with basic ADLs, Independent with household mobility without device, Independent with community mobility without device, Independent with gait, Independent with transfers, and Needs assistance with homemaking  PATIENT GOALS I want to be able to walk and gain strength in arms and legs; be able to play with great-grandson more  OBJECTIVE:   Gait: Gait pattern: step through; slow gait pattern  Distance walked: approx. 23' from lobby to mat table on far side of gym Assistive device utilized: None Level of assistance: SBA Comments: pt declines use of assistive device at this time  THER EX: Sit to stand from standard chair with bil. UE support 5 reps Pt performed bridging exercise bil. Lower extremities 10 reps with 3 sec hold with rest break after 7 reps  Straight leg raise bil.  LE's 10 reps each leg  Hip abduction sidelying 10 reps each leg - rest break needed after 5-6 reps on each leg  Clam shell - in sidelying - 10 reps RLE and LLE without any resistance  Pt performed SciFit exercise level 2.0 with bil. UE's & LE's for strengthening cardiovascular conditioning   Demonstrated SLS exercise to pt at end of session - instructed to perform 10 sec hold with UE support - on both RLE and LLE - pt verbalized understanding but did not perform due to c/o fatigue at end of session   [facility] HEP: Access Code: [email or web address] URL: [email or web address] Date: 07/13/2022 Prepared by: Ethelene Browns  Exercises - Supine Bridge  - 1 x daily - 7 x weekly - 1 sets - 10 reps - 3 sec hold - Beginner Side Leg Lift  - 1 x daily - 7 x weekly - 1 sets - 10 reps - 2-3 sec hold - Supine Active Straight Leg Raise  - 1 x daily - 7 x weekly - 1 sets - 10 reps - 2-3 sec hold -  Clamshell  - 1 x daily - 7 x weekly - 1 sets - 10 reps - 3-5 sec hold hold - Sit to Stand  - 1 x daily - 7 x weekly - 1 sets - 10 reps - Single Leg Stance with Support  - 1 x daily - 7 x weekly - 1 sets - 2 reps - 10 sec  hold   PATIENT EDUCATION: Education details: [facility] [email or web address] - see above Person educated: Patient Education method: Explanation, demonstration, handouts  Education comprehension: verbalized understanding, demonstrated understanding    HOME EXERCISE PROGRAM: See above - [facility]    GOALS: Goals reviewed with patient? Yes  SHORT TERM GOALS: Target date: 07/22/2022  Improve TUG score to </= 13.0 secs without device for reduced fall risk. Baseline:  15.62 secs Goal status: INITIAL  2.  Increase gait velocity to >/= 1.70 ft/sec without device for increased gait efficiency. Baseline: 1.30 ft/sec (25.22 secs) Goal status: INITIAL  3.  Pt will improve 5 times sit to stand score to </= 17 secs without UE support to demo increased LE strength.  Baseline: 20.75 secs without UE  support Goal status: INITIAL  4.  Pt will amb. 5" nonstop without device with c/o back pain </= 5/10 for increased activity tolerance. Baseline: 2" 51 secs 350'  Goal status: INITIAL  5.  Pt will report ability to stand for at least 15" to perform kitchen/cooking activity with c/o back pain </= 6/10. Baseline: 5" standing - back pain 8/10 intensity Goal status: INITIAL  6.  Independent in HEP for LE strengthening and balance exercises, including walking program. Baseline:  Goal status: INITIAL  LONG TERM GOALS: Target date: 08/19/2022  Improve TUG score to </= 11.0 secs without device for reduced fall risk. Baseline:  15.62 secs Goal status: INITIAL  2.  Increase gait velocity to >/= 2.0 ft/sec without device for increased gait efficiency. Baseline: 1.30 ft/sec (25.22 secs) Goal status: INITIAL  3.  Pt will amb. 10" nonstop without device with c/o back pain </= 4/10 for increased activity tolerance. Baseline: 2" 51 secs 350'  Goal status: INITIAL  4.  Pt will report ability to stand for at least 30" to perform kitchen/cooking activity with c/o back pain </= 5/10. Baseline: 5" standing - back pain 8/10 intensity Goal status: INITIAL  5.  Perform floor to stand transfer with UE support with SBA Baseline: To be assessed Goal status: INITIAL  6.  Independent in HEP including strengthening exercises and aquatic exercises to be continued upon discharge. Baseline: To be established Goal status: INITIAL  ASSESSMENT:  CLINICAL IMPRESSION: Patient seen today for 1st land visit for PT, following 1st aquatic therapy session completed yesterday, on 07-11-22.  Pt presents with decreased endurance and activity tolerance with slow and slightly unsteady gait but pt declines use of assistive device at this time.  Pt reports she is very jittery today and thinks she may have low K+ level - states she is going to contact PCP later today.  Pt required frequent short rest breaks during 45" PT  session due to fatigue/decreased endurance and activity tolerance.  Pt also reported more difficulty performing exercises with LLE compared to RLE.  Cont with POC.     OBJECTIVE IMPAIRMENTS decreased activity tolerance, decreased balance, decreased endurance, difficulty walking, decreased strength, and pain.   ACTIVITY LIMITATIONS carrying, lifting, bending, standing, squatting, stairs, and locomotion level  PARTICIPATION LIMITATIONS: meal prep, cleaning, laundry, shopping, and community activity  PERSONAL FACTORS Age, Fitness, Past/current experiences,  and 1 comorbidity: chronic low back pain  are also affecting patient's functional outcome.   REHAB POTENTIAL: Good  CLINICAL DECISION MAKING: Evolving/moderate complexity  EVALUATION COMPLEXITY: Moderate  PLAN: PT FREQUENCY: 2x/week  PT DURATION: 8 weeks  PLANNED INTERVENTIONS: Therapeutic exercises, Therapeutic activity, Neuromuscular re-education, Balance training, Gait training, Patient/Family education, Self Care, Stair training, DME instructions, and Aquatic Therapy  PLAN FOR NEXT SESSION:  Check HEP issued on 07-12-22 for any questions - cont. Strengthening exercises as tolerated; pt requesting more pool sessions than land -- I recommended that she do at least 4-5 land visits if able to tolerate   Henrique Parekh, [place], PT 07/13/2022, 11:42 AM

## 2022-07-13 ENCOUNTER — Encounter (HOSPITAL_BASED_OUTPATIENT_CLINIC_OR_DEPARTMENT_OTHER): Payer: Self-pay | Admitting: Emergency Medicine

## 2022-07-13 ENCOUNTER — Emergency Department (HOSPITAL_BASED_OUTPATIENT_CLINIC_OR_DEPARTMENT_OTHER): Payer: Medicare Other | Admitting: Radiology

## 2022-07-13 ENCOUNTER — Encounter: Payer: Self-pay | Admitting: Physical Therapy

## 2022-07-13 ENCOUNTER — Telehealth: Payer: Self-pay

## 2022-07-13 ENCOUNTER — Other Ambulatory Visit: Payer: Self-pay

## 2022-07-13 ENCOUNTER — Emergency Department (HOSPITAL_BASED_OUTPATIENT_CLINIC_OR_DEPARTMENT_OTHER): Payer: Medicare Other

## 2022-07-13 ENCOUNTER — Emergency Department (HOSPITAL_BASED_OUTPATIENT_CLINIC_OR_DEPARTMENT_OTHER)
Admission: EM | Admit: 2022-07-13 | Discharge: 2022-07-13 | Disposition: A | Discharge status: Home / Self Care | Payer: Medicare Other | Attending: Emergency Medicine | Admitting: Emergency Medicine

## 2022-07-13 DIAGNOSIS — R531 Weakness: Secondary | ICD-10-CM | POA: Diagnosis not present

## 2022-07-13 DIAGNOSIS — J189 Pneumonia, unspecified organism: Secondary | ICD-10-CM

## 2022-07-13 DIAGNOSIS — R059 Cough, unspecified: Secondary | ICD-10-CM | POA: Diagnosis present

## 2022-07-13 DIAGNOSIS — J181 Lobar pneumonia, unspecified organism: Secondary | ICD-10-CM | POA: Insufficient documentation

## 2022-07-13 DIAGNOSIS — E860 Dehydration: Secondary | ICD-10-CM | POA: Diagnosis not present

## 2022-07-13 DIAGNOSIS — E871 Hypo-osmolality and hyponatremia: Secondary | ICD-10-CM | POA: Insufficient documentation

## 2022-07-13 DIAGNOSIS — R42 Dizziness and giddiness: Secondary | ICD-10-CM | POA: Diagnosis not present

## 2022-07-13 LAB — CBC
HCT: 34.3 % — ABNORMAL LOW (ref 36.0–46.0)
Hemoglobin: 11.9 g/dL — ABNORMAL LOW (ref 12.0–15.0)
MCH: 35.2 pg — ABNORMAL HIGH (ref 26.0–34.0)
MCHC: 34.7 g/dL (ref 30.0–36.0)
MCV: 101.5 fL — ABNORMAL HIGH (ref 80.0–100.0)
Platelets: 103 10*3/uL — ABNORMAL LOW (ref 150–400)
RBC: 3.38 MIL/uL — ABNORMAL LOW (ref 3.87–5.11)
RDW: 12.7 % (ref 11.5–15.5)
WBC: 47.1 10*3/uL — ABNORMAL HIGH (ref 4.0–10.5)
nRBC: 0 % (ref 0.0–0.2)

## 2022-07-13 LAB — BASIC METABOLIC PANEL
Anion gap: 14 (ref 5–15)
BUN: 19 mg/dL (ref 8–23)
CO2: 21 mmol/L — ABNORMAL LOW (ref 22–32)
Calcium: 9.3 mg/dL (ref 8.9–10.3)
Chloride: 96 mmol/L — ABNORMAL LOW (ref 98–111)
Creatinine, Ser: 1.04 mg/dL — ABNORMAL HIGH (ref 0.44–1.00)
GFR, Estimated: 56 mL/min — ABNORMAL LOW (ref >60)
Glucose, Bld: 121 mg/dL — ABNORMAL HIGH (ref 70–99)
Potassium: 4.9 mmol/L (ref 3.5–5.1)
Sodium: 131 mmol/L — ABNORMAL LOW (ref 135–145)

## 2022-07-13 LAB — URINALYSIS, ROUTINE W REFLEX MICROSCOPIC
Bilirubin Urine: NEGATIVE
Glucose, UA: NEGATIVE mg/dL
Hgb urine dipstick: NEGATIVE
Ketones, ur: NEGATIVE mg/dL
Nitrite: NEGATIVE
Protein, ur: NEGATIVE mg/dL
Specific Gravity, Urine: 1.016 (ref 1.005–1.030)
pH: 6.5 (ref 5.0–8.0)

## 2022-07-13 LAB — TROPONIN I (HIGH SENSITIVITY)
Troponin I (High Sensitivity): 3 ng/L (ref <18)
Troponin I (High Sensitivity): 3 ng/L (ref <18)

## 2022-07-13 LAB — MAGNESIUM: Magnesium: 1.5 mg/dL — ABNORMAL LOW (ref 1.7–2.4)

## 2022-07-13 MED ORDER — CEFPODOXIME PROXETIL 200 MG PO TABS: 200.0000 mg | ORAL_TABLET | Freq: Two times a day (BID) | ORAL | 0 refills | Status: AC

## 2022-07-13 MED ORDER — SODIUM CHLORIDE 0.9 % IV SOLN
1.0000 g | Freq: Once | INTRAVENOUS | Status: AC
  Administered 2022-07-13: 1 g via INTRAVENOUS
  Filled 2022-07-13: qty 10

## 2022-07-13 MED ORDER — DOXYCYCLINE HYCLATE 100 MG PO CAPS: 100.0000 mg | ORAL_CAPSULE | Freq: Two times a day (BID) | ORAL | 0 refills | Status: AC

## 2022-07-13 MED ORDER — SODIUM CHLORIDE 0.9 % IV BOLUS
1000.0000 mL | Freq: Once | INTRAVENOUS | Status: AC
  Administered 2022-07-13: 1000 mL via INTRAVENOUS

## 2022-07-13 MED ORDER — MAGNESIUM SULFATE 2 GM/50ML IV SOLN
2.0000 g | Freq: Once | INTRAVENOUS | Status: AC
Start: 2022-07-13 — End: 2022-07-13
  Administered 2022-07-13: 2 g via INTRAVENOUS
  Filled 2022-07-13: qty 50

## 2022-07-13 NOTE — Discharge Instructions (Addendum)
Today you were seen in the emergency department for your weakness. We did lab work which showed that your sodium and magnesium were low.   You were found to have a pneumonia on your chest x-ray so please take the antibiotics we have prescribed you at home for this.   Follow-up with your primary doctor in 2-3 days regarding your visit. You will need a repeat chest x-ray in a few weeks to ensure that your pneumonia is improving and that there is not a tumor in your lungs.   Return to the ED for any new concerns.

## 2022-07-13 NOTE — ED Notes (Signed)
Pt given cup of water 

## 2022-07-13 NOTE — Telephone Encounter (Signed)
The patient called in to report that she had severe headache, feet cramping, fatigue, feels bad, and an elevated blood pressure of 161/95 76 HR, 94% O2. The patient reports she feels like she is dehydrated and that she had to take three breaks before walking in at her Physical Therapy appointment yesterday. She also reports these symptoms have been ongoing for a week and she now feels even worse. The patient was sent to her local emergency room due to ongoing symptoms and her current diagnoses. The patient reports that she will go to her local emergency room today.

## 2022-07-13 NOTE — ED Provider Notes (Signed)
[facility] Provider Note   CSN: [id number] Arrival date & time: 07/13/22  1216     History  Chief Complaint  Patient presents with   Weakness    Rebecca Bond is a 76 y.o. female.  Patient presents with general weakness, headache, nausea and lightheadedness with standing.  Patient's had this before and it is improved with dehydration.  Patient's had low sodium and low magnesium and low potassium in the past.  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  Patient denies any fevers, she does have a mild cough nonproductive she thinks is overall similar to her allergies.  No shortness of breath or chest pain.       Home Medications Prior to Admission medications   Medication Sig Start Date End Date Taking? Authorizing Provider  acetaminophen (TYLENOL) 325 MG tablet Take 650 mg by mouth every 6 (six) hours as needed for mild pain, fever or headache.    [provider]  ALPRAZolam (XANAX) 1 MG tablet TAKE 1/2-1 TABLET 2-3 TIMES DAILY ONLY IF NEEDED FOR PANIC ATTACK/ANXIETY ATTACK & LIMIT TO 5 DAYS /WEEK TO AVOID ADDICTION & DEMENTIA 07/07/22   Alycia Rossetti, NP  bisoprolol (ZEBETA) 10 MG tablet Take  1 tablet  Daily  for BP                                                                       /                                 TAKE       BY  MOUTH 12/26/21   [name], MD  buPROPion (WELLBUTRIN XL) 150 MG 24 hr tablet Take  1 tablet  Daily  for Mood / Depression, Focus  & Concentration                                            /                          TAKE  BY MOUTH 12/04/21   [name], MD  Cholecalciferol (VITAMIN D3) 250 MCG (10000 UT) TABS Take 5,000 Units by mouth in the morning.    [provider]  ezetimibe (ZETIA) 10 MG tablet Take  1 tablet  Daily for Cholesterol                                            /                                  TAKE                BY           MOUTH 02/21/22   [name], MD   FLUoxetine (PROZAC) 20 MG capsule TAKE  1 CAPSULE BY MOUTH DAILY FOR MOOD 05/09/22   Alycia Rossetti, NP  levothyroxine (SYNTHROID) 50 MCG tablet TAKE 1 TABLET(50 MCG) BY MOUTH DAILY 04/07/22   Alycia Rossetti, NP  NIFEdipine (PROCARDIA-XL/NIFEDICAL-XL) 30 MG 24 hr tablet Take 1 tablet (30 mg total) by mouth daily. 02/28/22 02/28/23  [name], MD  omeprazole (PRILOSEC) 20 MG capsule TAKE 1 CAPSULE BY MOUTH 30 MINUTES BEFORE BREAKFAST AND EVENING MEAL FOR 2 WEEKS. CONTINUE IF PAIN IS IMPROVED 01/18/22   Alycia Rossetti, NP  RESTASIS 0.05 % ophthalmic emulsion Place 1 drop into both eyes 2 (two) times daily as needed (for seasonal allergies/irritation).  08/19/19   [provider]  vitamin C (ASCORBIC ACID) 500 MG tablet Take 500 mg by mouth daily.    [provider]  zinc gluconate 50 MG tablet Take 50 mg by mouth daily.    [provider]      Allergies    Hyzaar [losartan potassium-hctz], Sudafed [pseudoephedrine hcl], Levaquin [levofloxacin in d5w], Acyclovir and related, Biaxin [clarithromycin], Cantaloupe extract allergy skin test, Celexa [citalopram], Flexeril [cyclobenzaprine], Fosamax [alendronate sodium], Gabapentin, Iohexol, Losartan, Meloxicam, Norvasc [amlodipine], Other, Pseudoephedrine, and Zoloft [sertraline hcl]    Review of Systems   Review of Systems  Constitutional:  Positive for fatigue. Negative for chills and fever.  HENT:  Negative for congestion.   Eyes:  Negative for visual disturbance.  Respiratory:  Positive for cough. Negative for shortness of breath.   Cardiovascular:  Negative for chest pain.  Gastrointestinal:  Negative for abdominal pain and vomiting.  Genitourinary:  Negative for dysuria and flank pain.  Musculoskeletal:  Negative for back pain, neck pain and neck stiffness.  Skin:  Negative for rash.  Neurological:  Positive for weakness. Negative for light-headedness and headaches.    Physical Exam Updated Vital Signs BP  (!) 142/68   Pulse 64   Temp 98.7 F (37.1 C)   Resp 18   Ht '5\' 2"'$  (1.575 m)   Wt 83.9 kg   SpO2 96%   BMI 33.84 kg/m  Physical Exam Vitals and nursing note reviewed.  Constitutional:      General: She is not in acute distress.    Appearance: She is well-developed.  HENT:     Head: Normocephalic and atraumatic.     Comments: Dry mm Eyes:     General:        Right eye: No discharge.        Left eye: No discharge.     Conjunctiva/sclera: Conjunctivae normal.  Neck:     Trachea: No tracheal deviation.  Cardiovascular:     Rate and Rhythm: Normal rate and regular rhythm.  Pulmonary:     Effort: Pulmonary effort is normal.     Breath sounds: Rales present.  Abdominal:     General: There is no distension.     Palpations: Abdomen is soft.     Tenderness: There is no abdominal tenderness. There is no guarding.  Musculoskeletal:        General: No swelling or tenderness.     Cervical back: Normal range of motion and neck supple. No rigidity.  Skin:    General: Skin is warm.     Capillary Refill: Capillary refill takes less than 2 seconds.     Findings: No rash.  Neurological:     General: No focal deficit present.     Mental Status: She is alert.     Cranial Nerves: No cranial nerve deficit.  Sensory: No sensory deficit.     Coordination: Coordination normal.     Comments: Gen weakness  Psychiatric:        Mood and Affect: Mood normal.     ED Results / Procedures / Treatments   Labs (all labs ordered are listed, but only abnormal results are displayed) Labs Reviewed  BASIC METABOLIC PANEL - Abnormal; Notable for the following components:      Result Value   Sodium 131 (*)    Chloride 96 (*)    CO2 21 (*)    Glucose, Bld 121 (*)    Creatinine, Ser 1.04 (*)    GFR, Estimated 56 (*)    All other components within normal limits  CBC - Abnormal; Notable for the following components:   WBC 47.1 (*)    RBC 3.38 (*)    Hemoglobin 11.9 (*)    HCT 34.3 (*)     MCV 101.5 (*)    MCH 35.2 (*)    Platelets 103 (*)    All other components within normal limits  MAGNESIUM  TROPONIN I (HIGH SENSITIVITY)  TROPONIN I (HIGH SENSITIVITY)    EKG None  Radiology CT Head Wo Contrast  Result Date: 07/13/2022 CLINICAL DATA:  Dizziness, dehydration or hypotension Headache, tension-type weakness, HA EXAM: CT HEAD WITHOUT CONTRAST TECHNIQUE: Contiguous axial images were obtained from the base of the skull through the vertex without intravenous contrast. RADIATION DOSE REDUCTION: This exam was performed according to the departmental dose-optimization program which includes automated exposure control, adjustment of the mA and/or kV according to patient size and/or use of iterative reconstruction technique. COMPARISON:  CT head 10/04/2021 BRAIN: BRAIN Cerebral ventricle sizes are concordant with the degree of cerebral volume loss. Patchy and confluent areas of decreased attenuation are noted throughout the deep and periventricular white matter of the cerebral hemispheres bilaterally, compatible with chronic microvascular ischemic disease. No evidence of large-territorial acute infarction. No parenchymal hemorrhage. No mass lesion. No extra-axial collection. No mass effect or midline shift. No hydrocephalus. Basilar cisterns are patent. Vascular: No hyperdense vessel. Skull: No acute fracture or focal lesion. Sinuses/Orbits: Left sphenoid sinus mucosal thickening. Paranasal sinuses and mastoid air cells are clear. Bilateral lens replacement. Otherwise the orbits are unremarkable. Other: None. IMPRESSION: No acute intracranial abnormality. Electronically Signed   By: Iven Finn M.D.   On: 07/13/2022 15:13   DG Chest 2 View  Result Date: 07/13/2022 CLINICAL DATA:  Weakness. EXAM: CHEST - 2 VIEW COMPARISON:  October 04, 2021. FINDINGS: Stable cardiomediastinal silhouette. Left lung is clear. Mild right upper and lower lobe airspace opacities are noted concerning for possible  pneumonia. Bony thorax is unremarkable. IMPRESSION: Mild right upper and lower lobe airspace opacities are noted concerning for pneumonia. Followup PA and lateral chest X-ray is recommended in 3-4 weeks following trial of antibiotic therapy to ensure resolution and exclude underlying malignancy. Electronically Signed   By: Marijo Conception M.D.   On: 07/13/2022 13:58    Procedures Procedures    Medications Ordered in ED Medications  sodium chloride 0.9 % bolus 1,000 mL (has no administration in time range)  cefTRIAXone (ROCEPHIN) 1 g in sodium chloride 0.9 % 100 mL IVPB (has no administration in time range)    ED Course/ Medical Decision Making/ A&P Clinical Course as of 07/13/22 1547  Wed Jul 13, 2022  1543 Received signout from Dr Reather Converse. 76 yo F with hx of CLL who presented with generalized weakness. Found to have dehydration and possible CAP  which we will treat with OP abx. Pending PO and ambulatory trial at this time.  [RP]    Clinical Course User Index [RP] Fransico Meadow, MD                           Medical Decision Making Amount and/or Complexity of Data Reviewed Labs: ordered. Radiology: ordered.   Patient presents with general weakness and concern for dehydration.  Patient is dry mucous membranes, generally weak on exam no focal neurodeficits.  I do not feel this is stroke related.  Patient received CT scan of the head without contrast as headaches different than previous, no temporal tenderness, gradual onset nonacute.  CT scan results reviewed no acute findings.  Plan for IV fluid bolus due to hyponatremia 131, hypochloremia as well.  Potassium normal this time.  Magnesium added for follow-up.  Urinalysis pending.  CBC reviewed white blood cell count 47.1, patient has multiple historical numbers elevated due to CLL, hemoglobin 11.9.  No fever.    Chest x-ray reviewed concerning for pneumonia in the right.  Discussed she will need repeat chest x-ray in a couple weeks and  if no resolution will need CT scan to look for signs of cancer.  Patient has mild cough without fever.  Patient is normal work of breathing and normal oxygenation.  Patient prefers to follow-up outpatient assuming she improves with IV fluids.  Rocephin ordered to start IV antibiotic treatment for pneumonia.  Patient care signed out to follow-up results and reassess prior to discharge.        Final Clinical Impression(s) / ED Diagnoses Final diagnoses:  General weakness  Community acquired pneumonia of right lower lobe of lung  Hyponatremia  Dehydration    Rx / DC Orders ED Discharge Orders     None         Elnora Morrison, MD 07/13/22 1547

## 2022-07-13 NOTE — ED Provider Notes (Signed)
  Physical Exam  BP (!) 143/57 (BP Location: Left Arm)   Pulse 63   Temp 98 F (36.7 C) (Oral)   Resp 17   Ht '5\' 2"'$  (1.575 m)   Wt 83.9 kg   SpO2 97%   BMI 33.84 kg/m   Physical Exam  Procedures  Procedures  ED Course / MDM   Clinical Course as of 07/14/22 1802  Wed Jul 13, 2022  1543 Received signout from Dr Reather Converse. 76 yo F with hx of CLL who presented with generalized weakness. Found to have dehydration and possible CAP which we will treat with OP abx. Pending PO and ambulatory trial at this time.  [RP]  1900 Patient was able to ambulate without difficulty.  Her magnesium was found to be low and was given to her via IV.  She is tolerating p.o. and was well-appearing on my repeat evaluation and requesting to go home.  Patient was then discharged home with cefpodoxime and doxycycline for community-acquired pneumonia. [RP]    Clinical Course User Index [RP] Fransico Meadow, MD   Medical Decision Making Amount and/or Complexity of Data Reviewed Labs: ordered. Radiology: ordered.  Risk Prescription drug management.         Fransico Meadow, MD 07/14/22 [phone]

## 2022-07-13 NOTE — ED Triage Notes (Signed)
Pt here from home with c/o gen weakness , pt has hx of CLL , c/o headache along with some nausea

## 2022-07-13 NOTE — ED Notes (Signed)
Pt ambulated to bathroom 

## 2022-07-13 NOTE — ED Notes (Signed)
Pt ambulated to bathroom, gait steady. No assistance needed.

## 2022-07-18 ENCOUNTER — Ambulatory Visit: Payer: Self-pay | Admitting: Physical Therapy

## 2022-07-19 ENCOUNTER — Encounter: Payer: Medicare Other | Admitting: Internal Medicine

## 2022-07-21 ENCOUNTER — Ambulatory Visit (INDEPENDENT_AMBULATORY_CARE_PROVIDER_SITE_OTHER): Payer: Medicare Other | Admitting: Nurse Practitioner

## 2022-07-21 ENCOUNTER — Encounter: Payer: Self-pay | Admitting: Nurse Practitioner

## 2022-07-21 VITALS — BP 140/60 | HR 66 | Temp 97.6°F | Ht 62.0 in | Wt 192.6 lb

## 2022-07-21 DIAGNOSIS — J189 Pneumonia, unspecified organism: Secondary | ICD-10-CM

## 2022-07-21 DIAGNOSIS — Z79899 Other long term (current) drug therapy: Secondary | ICD-10-CM

## 2022-07-21 DIAGNOSIS — R531 Weakness: Secondary | ICD-10-CM | POA: Diagnosis not present

## 2022-07-21 DIAGNOSIS — Z09 Encounter for follow-up examination after completed treatment for conditions other than malignant neoplasm: Secondary | ICD-10-CM

## 2022-07-21 DIAGNOSIS — M10172 Lead-induced gout, left ankle and foot: Secondary | ICD-10-CM

## 2022-07-21 DIAGNOSIS — E871 Hypo-osmolality and hyponatremia: Secondary | ICD-10-CM

## 2022-07-21 DIAGNOSIS — R790 Abnormal level of blood mineral: Secondary | ICD-10-CM

## 2022-07-21 DIAGNOSIS — E860 Dehydration: Secondary | ICD-10-CM | POA: Diagnosis not present

## 2022-07-21 DIAGNOSIS — C9110 Chronic lymphocytic leukemia of B-cell type not having achieved remission: Secondary | ICD-10-CM

## 2022-07-21 MED ORDER — PREDNISONE 20 MG PO TABS: ORAL_TABLET | ORAL | 0 refills | Status: DC

## 2022-07-21 NOTE — Patient Instructions (Signed)
Gout  Gout is painful swelling of your joints. Gout is a type of arthritis. It is caused by having too much uric acid in your body. Uric acid is a chemical that is made when your body breaks down substances called purines. If your body has too much uric acid, sharp crystals can form and build up in your joints. This causes pain and swelling. Gout attacks can happen quickly and be very painful (acute gout). Over time, the attacks can affect more joints and happen more often (chronic gout). What are the causes? Gout is caused by too much uric acid in your blood. This can happen because: Your kidneys do not remove enough uric acid from your blood. Your body makes too much uric acid. You eat too many foods that are high in purines. These foods include organ meats, some seafood, and beer. Trauma or stress can bring on an attack. What increases the risk? Having a family history of gout. Being female and middle-aged. Being female and having gone through menopause. Having an organ transplant. Taking certain medicines. Having certain conditions, such as: Being very overweight (obese). Lead poisoning. Kidney disease. A skin condition called psoriasis. Other risks include: Losing weight too quickly. Not having enough water in the body (being dehydrated). Drinking alcohol, especially beer. Drinking beverages that are sweetened with a type of sugar called fructose. What are the signs or symptoms? An attack of acute gout often starts at night and usually happens in just one joint. The most common place is the big toe. Other joints that may be affected include joints of the feet, ankle, knee, fingers, wrist, or elbow. Symptoms may include: Very bad pain. Warmth. Swelling. Stiffness. Tenderness. The affected joint may be very painful to touch. Shiny, red, or purple skin. Chills and fever. Chronic gout may cause symptoms more often. More joints may be involved. You may also have white or yellow lumps  (tophi) on your hands or feet or in other areas near your joints. How is this treated? Treatment for an acute attack may include medicines for pain and swelling, such as: NSAIDs, such as ibuprofen. Steroids taken by mouth or injected into a joint. Colchicine. This can be given by mouth or through an IV tube. Treatment to prevent future attacks may include: Taking small doses of NSAIDs or colchicine daily. Using a medicine that reduces uric acid levels in your blood, such as allopurinol. Making changes to your diet. You may need to see a food expert (dietitian) about what to eat and drink to prevent gout. Follow these instructions at home: During a gout attack  If told, put ice on the painful area. To do this: Put ice in a plastic bag. Place a towel between your skin and the bag. Leave the ice on for 20 minutes, 2-3 times a day. Take off the ice if your skin turns bright red. This is very important. If you cannot feel pain, heat, or cold, you have a greater risk of damage to the area. Raise the painful joint above the level of your heart as often as you can. Rest the joint as much as possible. If the joint is in your leg, you may be given crutches. Follow instructions from your doctor about what you cannot eat or drink. Avoiding future gout attacks Eat a low-purine diet. Avoid foods and drinks such as: Liver. Kidney. Anchovies. Asparagus. Herring. Mushrooms. Mussels. Beer. Stay at a healthy weight. If you want to lose weight, talk with your doctor. Do not   lose weight too fast. Start or continue an exercise plan as told by your doctor. Eating and drinking Avoid drinks sweetened by fructose. Drink enough fluids to keep your pee (urine) pale yellow. If you drink alcohol: Limit how much you have to: 0-1 drink a day for women who are not pregnant. 0-2 drinks a day for men. Know how much alcohol is in a drink. In the [place] one drink equals one 12 oz bottle of beer (355 mL), one 5 oz  glass of wine (148 mL), or one 1 oz glass of hard liquor (44 mL). General instructions Take over-the-counter and prescription medicines only as told by your doctor. Ask your doctor if you should avoid driving or using machines while you are taking your medicine. Return to your normal activities when your doctor says that it is safe. Keep all follow-up visits. Where to find more information National Institutes of Health: www.niams.nih.gov Contact a doctor if: You have another gout attack. You still have symptoms of a gout attack after 10 days of treatment. You have problems (side effects) because of your medicines. You have chills or a fever. You have burning pain when you pee (urinate). You have pain in your lower back or belly. Get help right away if: You have very bad pain. Your pain cannot be controlled. You cannot pee. Summary Gout is painful swelling of the joints. The most common site of pain is the big toe, but it can affect other joints. Medicines and avoiding some foods can help to prevent and treat gout attacks. This information is not intended to replace advice given to you by your health care provider. Make sure you discuss any questions you have with your health care provider. Document Revised: 08/25/2021 Document Reviewed: 08/25/2021 Elsevier Patient Education  2023 [employer]  

## 2022-07-21 NOTE — Progress Notes (Signed)
ER follow up  Assessment and Plan: Hospital visit follow up for:   1. Hospital discharge follow-up Review hospital course and treatment in full. All questions and concerns addressed.  2. General weakness Stay well hydrated. Rest.  - CBC with Differential/Platelet - COMPLETE METABOLIC PANEL WITH GFR  3. Community acquired pneumonia of right lower lobe of lung Discussed if no resolution will need CT scan to look for signs of cancer.  - DG Chest 2 View; Future - predniSONE (DELTASONE) 20 MG tablet; Take 2 tabs (40 mg) for 3 days followed by 1 tab (20 mg) for 4 days.  Dispense: 10 tablet; Refill: 0  4. Hyponatremia  - COMPLETE METABOLIC PANEL WITH GFR  5. Dehydration Discussed importance of pushing fluids to stay well hydrated and to keep mucus thin and productive.  - COMPLETE METABOLIC PANEL WITH GFR  6. Medication management All medications discussed and reviewed in full. All questions and concerns regarding medications addressed.    - CBC with Differential/Platelet - COMPLETE METABOLIC PANEL WITH GFR - Magnesium  7. CLL (chronic lymphocytic leukemia) (HCC)  - CBC with Differential/Platelet  8. Low magnesium level  - Magnesium  9. 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  - Uric acid - predniSONE (DELTASONE) 20 MG tablet; Take 2 tabs (40 mg) for 3 days followed by 1 tab (20 mg) for 4 days.  Dispense: 10 tablet; Refill: 0   All medications were reviewed with patient and family and fully reconciled. All questions answered fully, and patient and family members were encouraged to call the office with any further questions or concerns. Discussed goal to avoid readmission related to this diagnosis.    Over 40 minutes of exam, counseling, chart review, and complex, high/moderate level critical decision making was performed this visit.   Future Appointments  Date Time Provider [place]  07/25/2022 11:00  AM Drema Balzarine, [place] [facility]  07/27/2022  2:00 PM [facility] None  07/27/2022  2:30 PM Brunetta Genera, MD [facility] None  07/28/2022  1:15 PM Guido Sander, PT [facility]  08/01/2022 11:45 AM Drema Balzarine, PTA [facility]  08/02/2022  1:15 PM Guido Sander, PT [facility] [place]  08/09/2022  1:15 PM Guido Sander, PT [facility] [place]  08/16/2022  1:15 PM Guido Sander, PT [facility] [place]  08/22/2022  3:00 PM [name], MD [facility] None  09/29/2022  4:00 PM Alycia Rossetti, NP [facility] None    76 y.o.female presents for follow up for transition from recent hospitalization. Admit date to the hospital was 07/13/22, patient was discharged from the hospital on 07/13/22 and our clinical staff contacted the office the day after discharge to set up a follow up appointment. The discharge summary, medications, and diagnostic test results were reviewed before meeting with the patient.   The patient was admitted for: general weakness and concern for dehydration.  She was noted to have dry mucous membranes and generally weak on exam.  She had no focal neurodeficits.  CT scans of the head without contrast for headaches revealed no difference than previous, there was no temporal tenderness. CT scan results reviewed no acute findings.  She was hydrated with IV fluid bolus due to hyponatremia 131, hypochloremia as well.  Potassium was normal UA was obtained.  CBC reviewed white blood cell count 47.1, patient has multiple historical numbers elevated due to CLL, hemoglobin 11.9.  No fever.     Chest x-ray reviewed concerning for  pneumonia in the right.  Patient has mild cough without fever.  Patient is normal work of breathing and normal oxygenation.  She was provided IV Rocephin treatment for pneumonia.   Overall she reports feeling better today.  She continues to feel fatigued with a mild cough.  Home health is not involved.   Images while in the hospital: CT Head Wo  Contrast  Result Date: 07/13/2022 CLINICAL DATA:  Dizziness, dehydration or hypotension Headache, tension-type weakness, HA EXAM: CT HEAD WITHOUT CONTRAST TECHNIQUE: Contiguous axial images were obtained from the base of the skull through the vertex without intravenous contrast. RADIATION DOSE REDUCTION: This exam was performed according to the departmental dose-optimization program which includes automated exposure control, adjustment of the mA and/or kV according to patient size and/or use of iterative reconstruction technique. COMPARISON:  CT head 10/04/2021 BRAIN: BRAIN Cerebral ventricle sizes are concordant with the degree of cerebral volume loss. Patchy and confluent areas of decreased attenuation are noted throughout the deep and periventricular white matter of the cerebral hemispheres bilaterally, compatible with chronic microvascular ischemic disease. No evidence of large-territorial acute infarction. No parenchymal hemorrhage. No mass lesion. No extra-axial collection. No mass effect or midline shift. No hydrocephalus. Basilar cisterns are patent. Vascular: No hyperdense vessel. Skull: No acute fracture or focal lesion. Sinuses/Orbits: Left sphenoid sinus mucosal thickening. Paranasal sinuses and mastoid air cells are clear. Bilateral lens replacement. Otherwise the orbits are unremarkable. Other: None. IMPRESSION: No acute intracranial abnormality. Electronically Signed   By: Iven Finn M.D.   On: 07/13/2022 15:13   DG Chest 2 View  Result Date: 07/13/2022 CLINICAL DATA:  Weakness. EXAM: CHEST - 2 VIEW COMPARISON:  October 04, 2021. FINDINGS: Stable cardiomediastinal silhouette. Left lung is clear. Mild right upper and lower lobe airspace opacities are noted concerning for possible pneumonia. Bony thorax is unremarkable. IMPRESSION: Mild right upper and lower lobe airspace opacities are noted concerning for pneumonia. Followup PA and lateral chest X-ray is recommended in 3-4 weeks following  trial of antibiotic therapy to ensure resolution and exclude underlying malignancy. Electronically Signed   By: Marijo Conception M.D.   On: 07/13/2022 13:58     Current Outpatient Medications (Endocrine & Metabolic):    levothyroxine (SYNTHROID) 50 MCG tablet, TAKE 1 TABLET(50 MCG) BY MOUTH DAILY  Current Outpatient Medications (Cardiovascular):    ezetimibe (ZETIA) 10 MG tablet, Take  1 tablet  Daily for Cholesterol                                            /                                  TAKE                BY           MOUTH   bisoprolol (ZEBETA) 10 MG tablet, Take  1 tablet  Daily  for BP                                                                       /  TAKE       BY  MOUTH   NIFEdipine (PROCARDIA-XL/NIFEDICAL-XL) 30 MG 24 hr tablet, Take 1 tablet (30 mg total) by mouth daily.   Current Outpatient Medications (Analgesics):    acetaminophen (TYLENOL) 325 MG tablet, Take 650 mg by mouth every 6 (six) hours as needed for mild pain, fever or headache.   Current Outpatient Medications (Other):    ALPRAZolam (XANAX) 1 MG tablet, TAKE 1/2-1 TABLET 2-3 TIMES DAILY ONLY IF NEEDED FOR PANIC ATTACK/ANXIETY ATTACK & LIMIT TO 5 DAYS /WEEK TO AVOID ADDICTION & DEMENTIA   buPROPion (WELLBUTRIN XL) 150 MG 24 hr tablet, Take  1 tablet  Daily  for Mood / Depression, Focus  & Concentration                                            /                          TAKE  BY MOUTH   cefpodoxime (VANTIN) 200 MG tablet, Take 200 mg by mouth 2 (two) times daily.   DOXYCYCLINE HYCLATE PO, Take by mouth. '100mg'$    FLUoxetine (PROZAC) 20 MG capsule, TAKE 1 CAPSULE BY MOUTH DAILY FOR MOOD   omeprazole (PRILOSEC) 20 MG capsule, TAKE 1 CAPSULE BY MOUTH 30 MINUTES BEFORE BREAKFAST AND EVENING MEAL FOR 2 WEEKS. CONTINUE IF PAIN IS IMPROVED   RESTASIS 0.05 % ophthalmic emulsion, Place 1 drop into both eyes 2 (two) times daily as needed (for seasonal allergies/irritation).     Cholecalciferol (VITAMIN D3) 250 MCG (10000 UT) TABS, Take 5,000 Units by mouth in the morning. (Patient not taking: Reported on 07/21/2022)   vitamin C (ASCORBIC ACID) 500 MG tablet, Take 500 mg by mouth daily. (Patient not taking: Reported on 07/21/2022)   zinc gluconate 50 MG tablet, Take 50 mg by mouth daily. (Patient not taking: Reported on 07/21/2022)  Past Medical History:  Diagnosis Date   Anemia    Anxiety    Arthritis    CLL (chronic lymphocytic leukemia) ([place]) dx'd ~ 2015   Depression    GERD (gastroesophageal reflux disease)    Headache    "sinus headaches"   History of kidney stones    Hypertension    OSA (obstructive sleep apnea)    Osteoarthritis    Pneumonia    Prediabetes    Recurrent sinus infections 08/02/2012   Tubular adenoma of colon      Allergies  Allergen Reactions   Hyzaar [Losartan Potassium-Hctz] Swelling and Other (See Comments)    "messed up my sodium counts"and swelled lips   Sudafed [Pseudoephedrine Hcl] Palpitations   Levaquin [Levofloxacin In D5w] Diarrhea and Nausea Only   Acyclovir And Related Other (See Comments)    Reaction not recalled   Biaxin [Clarithromycin]     GI Upset   Cantaloupe Extract Allergy Skin Test Other (See Comments)    Nasal congestion, headaches, and sneezing   Celexa [Citalopram] Other (See Comments)    Reaction not recalled   Flexeril [Cyclobenzaprine]     "zombie-like" feeling   Fosamax [Alendronate Sodium]     GI upset   Gabapentin     confusion   Iohexol Hives and Other (See Comments)    Patient stated she broke out in hive 20 yrs ago from IV contrast      Losartan Swelling  and Other (See Comments)    Angioedema   Meloxicam     GI upset   Norvasc [Amlodipine] Swelling and Other (See Comments)    Ankles swell   Other Nausea Only and Other (See Comments)    Iceburg lettuce- "made me dreadfully nauseous" Raw mushrooms only-  "made me dreadfully nauseous" (cooked ones are tolerated)   Pseudoephedrine      Palpitations   Zoloft [Sertraline Hcl] Other (See Comments)    Has no emotions at all     ROS: all negative except above.   Physical Exam: Filed Weights   07/21/22 1540  Weight: 192 lb 9.6 oz (87.4 kg)   BP (!) 140/60   Pulse 66   Temp 97.6 F (36.4 C)   Ht '5\' 2"'$  (1.575 m)   Wt 192 lb 9.6 oz (87.4 kg)   SpO2 98%   BMI 35.23 kg/m  General Appearance: Well nourished, in no apparent distress. Eyes: PERRLA, EOMs, conjunctiva no swelling or erythema Sinuses: No Frontal/maxillary tenderness ENT/Mouth: Ext aud canals clear, TMs without erythema, bulging. No erythema, swelling, or exudate on post pharynx.  Tonsils not swollen or erythematous. Hearing normal.  Neck: Supple, thyroid normal.  Respiratory: Respiratory effort normal, BS equal bilaterally without rales, rhonchi, wheezing or stridor.  Cardio: RRR with no MRGs. Brisk peripheral pulses without edema.  Abdomen: Soft, + BS.  Non tender, no guarding, rebound, hernias, masses. Lymphatics: Non tender without lymphadenopathy.  Musculoskeletal: Full ROM, 5/5 strength, normal gait.  Skin: Warm, dry without rashes, lesions, ecchymosis.  Neuro: Cranial nerves intact. Normal muscle tone, no cerebellar symptoms. Sensation intact.  Psych: Awake and oriented X 3, normal affect, Insight and Judgment appropriate.     Darrol Jump, NP 3:56 PM [facility]

## 2022-07-22 ENCOUNTER — Ambulatory Visit: Payer: Medicare Other | Admitting: Physical Therapy

## 2022-07-25 ENCOUNTER — Ambulatory Visit: Payer: Medicare Other | Admitting: Physical Therapy

## 2022-07-26 ENCOUNTER — Ambulatory Visit
Admission: RE | Admit: 2022-07-26 | Discharge: 2022-07-26 | Disposition: A | Discharge status: Home / Self Care | Payer: Medicare Other | Source: Ambulatory Visit | Attending: Nurse Practitioner | Admitting: Nurse Practitioner

## 2022-07-26 ENCOUNTER — Other Ambulatory Visit: Payer: Self-pay | Admitting: *Deleted

## 2022-07-26 DIAGNOSIS — R059 Cough, unspecified: Secondary | ICD-10-CM | POA: Diagnosis not present

## 2022-07-26 DIAGNOSIS — Z09 Encounter for follow-up examination after completed treatment for conditions other than malignant neoplasm: Secondary | ICD-10-CM

## 2022-07-26 DIAGNOSIS — C9110 Chronic lymphocytic leukemia of B-cell type not having achieved remission: Secondary | ICD-10-CM

## 2022-07-26 DIAGNOSIS — J189 Pneumonia, unspecified organism: Secondary | ICD-10-CM

## 2022-07-27 ENCOUNTER — Inpatient Hospital Stay: Payer: Medicare Other | Admitting: Hematology

## 2022-07-27 ENCOUNTER — Other Ambulatory Visit: Payer: Self-pay

## 2022-07-27 ENCOUNTER — Inpatient Hospital Stay: Payer: Medicare Other | Attending: Hematology

## 2022-07-27 VITALS — BP 137/72 | HR 67 | Temp 98.0°F | Resp 17 | Ht 62.0 in | Wt 198.7 lb

## 2022-07-27 DIAGNOSIS — D801 Nonfamilial hypogammaglobulinemia: Secondary | ICD-10-CM | POA: Insufficient documentation

## 2022-07-27 DIAGNOSIS — Z90710 Acquired absence of both cervix and uterus: Secondary | ICD-10-CM | POA: Diagnosis not present

## 2022-07-27 DIAGNOSIS — D696 Thrombocytopenia, unspecified: Secondary | ICD-10-CM | POA: Insufficient documentation

## 2022-07-27 DIAGNOSIS — R161 Splenomegaly, not elsewhere classified: Secondary | ICD-10-CM | POA: Diagnosis not present

## 2022-07-27 DIAGNOSIS — C9110 Chronic lymphocytic leukemia of B-cell type not having achieved remission: Secondary | ICD-10-CM | POA: Insufficient documentation

## 2022-07-27 DIAGNOSIS — Z87891 Personal history of nicotine dependence: Secondary | ICD-10-CM | POA: Diagnosis not present

## 2022-07-27 DIAGNOSIS — I10 Essential (primary) hypertension: Secondary | ICD-10-CM | POA: Diagnosis not present

## 2022-07-27 DIAGNOSIS — D649 Anemia, unspecified: Secondary | ICD-10-CM

## 2022-07-27 LAB — CBC WITH DIFFERENTIAL (CANCER CENTER ONLY)
Abs Immature Granulocytes: 0.45 10*3/uL — ABNORMAL HIGH (ref 0.00–0.07)
Basophils Absolute: 0 10*3/uL (ref 0.0–0.1)
Basophils Relative: 0 %
Eosinophils Absolute: 0.1 10*3/uL (ref 0.0–0.5)
Eosinophils Relative: 0 %
HCT: 32.1 % — ABNORMAL LOW (ref 36.0–46.0)
Hemoglobin: 10.5 g/dL — ABNORMAL LOW (ref 12.0–15.0)
Immature Granulocytes: 1 %
Lymphocytes Relative: 89 %
Lymphs Abs: 85.8 10*3/uL — ABNORMAL HIGH (ref 0.7–4.0)
MCH: 33.5 pg (ref 26.0–34.0)
MCHC: 32.7 g/dL (ref 30.0–36.0)
MCV: 102.6 fL — ABNORMAL HIGH (ref 80.0–100.0)
Monocytes Absolute: 0.6 10*3/uL (ref 0.1–1.0)
Monocytes Relative: 1 %
Neutro Abs: 8.8 10*3/uL — ABNORMAL HIGH (ref 1.7–7.7)
Neutrophils Relative %: 9 %
Platelet Count: 127 10*3/uL — ABNORMAL LOW (ref 150–400)
RBC: 3.13 MIL/uL — ABNORMAL LOW (ref 3.87–5.11)
RDW: 12.7 % (ref 11.5–15.5)
Smear Review: NORMAL
WBC Count: 95.8 10*3/uL (ref 4.0–10.5)
nRBC: 0 % (ref 0.0–0.2)

## 2022-07-27 LAB — CMP (CANCER CENTER ONLY)
ALT: 44 U/L (ref 0–44)
AST: 40 U/L (ref 15–41)
Albumin: 4.2 g/dL (ref 3.5–5.0)
Alkaline Phosphatase: 62 U/L (ref 38–126)
Anion gap: 11 (ref 5–15)
BUN: 24 mg/dL — ABNORMAL HIGH (ref 8–23)
CO2: 25 mmol/L (ref 22–32)
Calcium: 9.7 mg/dL (ref 8.9–10.3)
Chloride: 99 mmol/L (ref 98–111)
Creatinine: 0.97 mg/dL (ref 0.44–1.00)
GFR, Estimated: >60 mL/min (ref >60)
Glucose, Bld: 92 mg/dL (ref 70–99)
Potassium: 4.4 mmol/L (ref 3.5–5.1)
Sodium: 135 mmol/L (ref 135–145)
Total Bilirubin: 0.5 mg/dL (ref 0.3–1.2)
Total Protein: 6.4 g/dL — ABNORMAL LOW (ref 6.5–8.1)

## 2022-07-27 LAB — LACTATE DEHYDROGENASE: LDH: 133 U/L (ref 98–192)

## 2022-07-27 LAB — MAGNESIUM: Magnesium: 1.7 mg/dL (ref 1.7–2.4)

## 2022-07-27 LAB — URIC ACID: Uric Acid, Serum: 6.2 mg/dL (ref 2.5–7.1)

## 2022-07-28 ENCOUNTER — Ambulatory Visit: Payer: Medicare Other | Admitting: Physical Therapy

## 2022-07-31 ENCOUNTER — Other Ambulatory Visit: Payer: Self-pay | Admitting: Nurse Practitioner

## 2022-07-31 DIAGNOSIS — R10816 Epigastric abdominal tenderness: Secondary | ICD-10-CM

## 2022-08-01 ENCOUNTER — Ambulatory Visit: Payer: Medicare Other | Admitting: Physical Therapy

## 2022-08-02 ENCOUNTER — Ambulatory Visit: Payer: Medicare Other | Admitting: Physical Therapy

## 2022-08-03 ENCOUNTER — Telehealth: Payer: Self-pay | Admitting: Nurse Practitioner

## 2022-08-03 NOTE — Telephone Encounter (Signed)
Pt said that Cancer center told her she had to get the lab results from [place] even though she did not order them. Per Marianna Fuss I am calling the [place] to get clarification

## 2022-08-03 NOTE — Progress Notes (Signed)
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 (patient reports -planned for August 2017) 13q deletion does pre-dispose her to recurrent SCC  Current Treatment: observation  Previous treatment: IVIG for several months last winter to reduce recurrent respiratory infections (Patient notes that this helped) Has not required definitive treatment for CLL at this time and has been reluctant to consider treatment when discussed in the setting of thrombocytopenia.  INTERVAL HISTORY:  SERRENA LINDERMAN here for continued evaluation and management of [name].  She notes no acute new symptoms related to the CLL at this time.  No abnormal bleeding or bruising no infection issues no significant new fatigue. She notes that she had an acute attack of gout in her great toe which were quite painful and required prednisone.. No other acute new symptoms at this time. Labs done today were reviewed in details.  Labs done today were discussed in detail with the patient.   REVIEW OF SYSTEMS:   10 Point review of Systems was done is negative except as noted above.   Past Medical History:  Diagnosis Date   Anemia    Anxiety    Arthritis    CLL (chronic lymphocytic leukemia) ([place]) dx'd ~ 2015   Depression    GERD (gastroesophageal reflux disease)    Headache    "sinus headaches"   History of kidney stones    Hypertension    OSA  (obstructive sleep apnea)    Osteoarthritis    Pneumonia    Prediabetes    Recurrent sinus infections 08/02/2012   Tubular adenoma of colon     . Past Surgical History:  Procedure Laterality Date   ABDOMINAL HYSTERECTOMY  1980's   "endometrosis"   APPENDECTOMY     BREAST SURGERY     CATARACT EXTRACTION, BILATERAL     CHOLECYSTECTOMY  1985   FUNCTIONAL ENDOSCOPIC SINUS SURGERY  1990's   "cause I kept having sinus infections"   immunoglobulin treatment  2017   LAPAROSCOPIC APPENDECTOMY N/A 06/04/2014   Procedure: APPENDECTOMY LAPAROSCOPIC;  Surgeon: Zenovia Jarred, MD;  Location: [place];  Service: General;  Laterality: N/A;   LUMBAR LAMINECTOMY/DECOMPRESSION MICRODISCECTOMY Left 11/01/2017   Procedure: Left Lumbar Four-Five Extraforaminal Microdiscectomy;  Surgeon: Eustace Moore, MD;  Location: [place];  Service: Neurosurgery;  Laterality: Left;   LYMPH NODE BIOPSY     "determined I had CLL"   SHOULDER ARTHROSCOPY Right    TEMPOROMANDIBULAR JOINT ARTHROPLASTY  1980's   TONSILLECTOMY AND ADENOIDECTOMY  1950's    . Social History   Tobacco Use   Smoking status: Former    Packs/day: 0.75    Years: 4.00    Total pack years: 3.00    Types: Cigarettes    Quit date: 12/05/1968    Years since quitting: 53.6   Smokeless tobacco: Never  Vaping Use   Vaping Use: Never used  Substance Use Topics   Alcohol use: Yes    Alcohol/week: 7.0 standard drinks of  alcohol    Types: 7 Glasses of wine per week   Drug use: No    ALLERGIES:  is allergic to hyzaar [losartan potassium-hctz], sudafed [pseudoephedrine hcl], levaquin [levofloxacin in d5w], acyclovir and related, biaxin [clarithromycin], cantaloupe extract allergy skin test, celexa [citalopram], flexeril [cyclobenzaprine], fosamax [alendronate sodium], gabapentin, iohexol, losartan, meloxicam, norvasc [amlodipine], other, pseudoephedrine, and zoloft [sertraline hcl].  MEDICATIONS:  Current Outpatient Medications  Medication Sig  Dispense Refill   acetaminophen (TYLENOL) 325 MG tablet Take 650 mg by mouth every 6 (six) hours as needed for mild pain, fever or headache.     ALPRAZolam (XANAX) 1 MG tablet TAKE 1/2-1 TABLET 2-3 TIMES DAILY ONLY IF NEEDED FOR PANIC ATTACK/ANXIETY ATTACK & LIMIT TO 5 DAYS /WEEK TO AVOID ADDICTION & DEMENTIA 80 tablet 0   bisoprolol (ZEBETA) 10 MG tablet Take  1 tablet  Daily  for BP                                                                       /                                 TAKE       BY  MOUTH 90 tablet 3   buPROPion (WELLBUTRIN XL) 150 MG 24 hr tablet Take  1 tablet  Daily  for Mood / Depression, Focus  & Concentration                                            /                          TAKE  BY MOUTH 90 tablet 3   cefpodoxime (VANTIN) 200 MG tablet Take 200 mg by mouth 2 (two) times daily.     Cholecalciferol (VITAMIN D3) 250 MCG (10000 UT) TABS Take 5,000 Units by mouth in the morning. (Patient not taking: Reported on 07/21/2022)     DOXYCYCLINE HYCLATE PO Take by mouth. '100mg'$      ezetimibe (ZETIA) 10 MG tablet Take  1 tablet  Daily for Cholesterol                                            /                                  TAKE                BY           MOUTH 90 tablet 3   FLUoxetine (PROZAC) 20 MG capsule TAKE 1 CAPSULE BY MOUTH DAILY FOR MOOD 90 capsule 3   levothyroxine (SYNTHROID) 50 MCG tablet TAKE 1 TABLET(50 MCG) BY MOUTH DAILY 30 tablet 11   NIFEdipine (PROCARDIA-XL/NIFEDICAL-XL) 30 MG 24 hr tablet Take 1 tablet (30 mg total) by mouth daily. [place]  tablet 11   omeprazole (PRILOSEC) 20 MG capsule TAKE 1 CAPSULE BY MOUTH 30 MINUTES BEFORE BREAKFAST AND EVENING MEAL FOR 2 WEEKS. CONTINUE IF PAIN IS IMPROVED 180 capsule 2   predniSONE (DELTASONE) 20 MG tablet Take 2 tabs (40 mg) for 3 days followed by 1 tab (20 mg) for 4 days. 10 tablet 0   RESTASIS 0.05 % ophthalmic emulsion Place 1 drop into both eyes 2 (two) times daily as needed (for seasonal allergies/irritation).       vitamin C (ASCORBIC ACID) 500 MG tablet Take 500 mg by mouth daily. (Patient not taking: Reported on 07/21/2022)     zinc gluconate 50 MG tablet Take 50 mg by mouth daily. (Patient not taking: Reported on 07/21/2022)     No current facility-administered medications for this visit.    PHYSICAL EXAMINATION: ECOG PERFORMANCE STATUS: 1  Vitals:   07/27/22 1425  BP: 137/72  Pulse: 67  Resp: 17  Temp: 98 F (36.7 C)  SpO2: 98%    Filed Weights   07/27/22 1425  Weight: 198 lb 11.2 oz (90.1 kg)   .Body mass index is 36.34 kg/m.  NAD GENERAL:alert, in no acute distress and comfortable SKIN: no acute rashes, no significant lesions EYES: conjunctiva are pink and non-injected, sclera anicteric OROPHARYNX: MMM, no exudates, no oropharyngeal erythema or ulceration NECK: supple, no JVD LYMPH:  no palpable lymphadenopathy in the cervical, axillary or inguinal regions LUNGS: clear to auscultation b/l with normal respiratory effort HEART: regular rate & rhythm ABDOMEN:  normoactive bowel sounds , non tender, not distended. Extremity: no pedal edema PSYCH: alert & oriented x 3 with fluent speech NEURO: no focal motor/sensory deficits    LABORATORY DATA:      Latest Ref Rng & Units 07/27/2022    2:14 PM 07/13/2022    1:30 PM 02/24/2022   11:49 AM  CBC  WBC 4.0 - 10.5 K/uL 95.8  47.1  37.7   Hemoglobin 12.0 - 15.0 g/dL 10.5  11.9  11.6   Hematocrit 36.0 - 46.0 % 32.1  34.3  34.5   Platelets 150 - 400 K/uL 127  103  71    CBC    Component Value Date/Time   WBC 95.8 (HH) 07/27/2022 1414   WBC 47.1 (H) 07/13/2022 1330   RBC 3.13 (L) 07/27/2022 1414   HGB 10.5 (L) 07/27/2022 1414   HGB 11.2 (L) 11/08/2017 1418   HCT 32.1 (L) 07/27/2022 1414   HCT 34.8 11/08/2017 1418   PLT 127 (L) 07/27/2022 1414   PLT 140 (L) 11/08/2017 1418   MCV 102.6 (H) 07/27/2022 1414   MCV 96.4 11/08/2017 1418   MCH 33.5 07/27/2022 1414   MCHC 32.7 07/27/2022 1414   RDW 12.7 07/27/2022 1414   RDW 14.8  (H) 11/08/2017 1418   LYMPHSABS 85.8 (H) 07/27/2022 1414   LYMPHSABS 87.0 (H) 11/08/2017 1418   MONOABS 0.6 07/27/2022 1414   MONOABS 1.5 (H) 11/08/2017 1418   EOSABS 0.1 07/27/2022 1414   EOSABS 0.6 (H) 11/08/2017 1418   BASOSABS 0.0 07/27/2022 1414   BASOSABS 0.2 (H) 11/08/2017 1418       Latest Ref Rng & Units 07/27/2022    2:14 PM 07/13/2022    1:30 PM 02/24/2022   11:49 AM  CMP  Glucose 70 - 99 mg/dL 92  121  110   BUN 8 - 23 mg/dL '24  19  16   '$ Creatinine 0.44 - 1.00 mg/dL 0.97  1.04  1.01   Sodium  135 - 145 mmol/L 135  131  136   Potassium 3.5 - 5.1 mmol/L 4.4  4.9  4.3   Chloride 98 - 111 mmol/L 99  96  102   CO2 22 - 32 mmol/L '25  21  24   '$ Calcium 8.9 - 10.3 mg/dL 9.7  9.3  9.4   Total Protein 6.5 - 8.1 g/dL 6.4   6.0   Total Bilirubin 0.3 - 1.2 mg/dL 0.5   0.9   Alkaline Phos 38 - 126 U/L 62     AST 15 - 41 U/L 40   17   ALT 0 - 44 U/L 44   9    . Lab Results  Component Value Date   LDH 133 07/27/2022       RADIOGRAPHIC STUDIES: I have personally reviewed the radiological images as listed and agreed with the findings in the report.  CT ABDOMEN AND PELVIS WITHOUT CONTRAST    IMPRESSION: 1. No acute abnormalities within the abdomen or pelvis. 2. Mild splenomegaly and several prominent to mildly enlarged pelvic and inguinal lymph nodes. These findings are similar to the prior CT. 3. Small nonobstructing stone in the lower pole the right kidney. No ureteral stones or obstructive uropathy.     Electronically Signed   By: Lajean Manes M.D.   On: 11/14/2016 16:39     ASSESSMENT & PLAN:   76 y.o.  caucasian female with   1. Rai Stage 2 previously - now Stage IV CLL with lymphocytosis, LNadenopathy and mild splenomegaly with anemia and thrombocytopenia. -patient has previously and continues to desire holding off CLL directed treatment as long as possible.   Lab Results  Component Value Date   LDH 133 07/27/2022   PLAN  -Labs done today were  reviewed in detail with the patient CBC shows WBC count of 95k hemoglobin of 10.5 with a platelet count of 127k Patient has no clinical or lab evidence of significant CLL progression at this time. Patient on prednisone due to gout flare which might also be elevating the white counts over her baseline. -No clear indication for initiating CLL treatment at this time  2.  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 Has been taking good infection prevention precautions. CLL initially diagnosed 2009 with 13q deletion. Only intervention thus far has been intermittent IVIG, clinically very helpful with decrease in respiratory infections.  Last imaging was CT AP 02-2015. --showed no evidence of Splenomegaly. Negative Hep B serology 2015  No overt new constitutional symptoms except mild unchanged fatigue. Has had significant recurrent headaches with IVIG - discontinued  3. Mild Anemia Hgb hemoglobin stable in the 10.5-11.3 range.  4. Moderate thrombocytopenia PLT count at 77k and has recently been in the 80-90k range.  5. H/o  Recurrent cutaneous SCC s/p Mohs surgery for SCC of the nosehealed. No issues with infection-   her 13q deletion places her at risk for recurrent SCC. -continue close f/u with dermatologist for evaluation and management of non melanoma skin cancers that can be increased in patient with CLL with 13q deletion.   FOLLOW UP: RTC with Dr Irene Limbo with labs in 4 months

## 2022-08-03 NOTE — Telephone Encounter (Signed)
Spoke to receptionist at the Cancer center and she said that Rebecca Bond had an appointment with then in 08/23 and that the provider discussed lab results with her in detail. The notes also say the same thing, they said they would call her and clarify the confusion.

## 2022-08-09 ENCOUNTER — Ambulatory Visit: Payer: Medicare Other | Admitting: Physical Therapy

## 2022-08-16 ENCOUNTER — Ambulatory Visit: Payer: Medicare Other | Admitting: Physical Therapy

## 2022-08-21 ENCOUNTER — Encounter: Payer: Self-pay | Admitting: Internal Medicine

## 2022-08-21 NOTE — Patient Instructions (Signed)

## 2022-08-21 NOTE — Progress Notes (Unsigned)
Annual Screening/Preventative Visit & Comprehensive Evaluation &  Examination  Future Appointments  Date Time Provider Department  08/22/2022  3:00 PM [name], MD [facility]  09/29/2022  4:00 PM Alycia Rossetti, NP [facility]  11/23/2022  1:30 PM [facility]  11/23/2022  2:00 PM Brunetta Genera, MD [facility]  08/24/2023  3:00 PM [name], MD [facility]    (Patient since 1996)       This very nice 76 y.o. MWF presents for a Screening /Preventative Visit & comprehensive evaluation and management of multiple medical co-morbidities.  Patient has been followed for HTN, HLD, CLL, Prediabetes  and Vitamin D Deficiency. Patient also has GERD controlled on her meds. CT scan of Abd on 05/05 /2021 showed Aortic Atherosclerosis.  mPatient has hx/o Major Depression on current meds. In Jan 2021, patient was dx's with mild OSA and declined starting recommended CPAP.         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        HTN predates circa  1998.  Patient's BP has been controlled at home and patient denies any cardiac symptoms as chest pain, palpitations, shortness of breath, dizziness or ankle swelling. Today's BP is                            .       Patient has Statin intolerance and her hyperlipidemia is controlled with diet and Ezetimibe.  Patient denies medication SE's. Last lipids were  at goal:  Lab Results  Component Value Date   CHOL 144 09/29/2021   HDL 73 09/29/2021   LDLCALC 55 09/29/2021   TRIG 80 09/29/2021   CHOLHDL 2.0 09/29/2021         Patient has hx/o prediabetes predating (A1c 6.0% /2012 ) and patient denies reactive hypoglycemic symptoms, visual blurring, diabetic polys or paresthesias. Last A1c was normal & at goal :  Lab Results  Component Value Date   HGBA1C 4.9 05/13/2021        Finally, patient has history of Vitamin D Deficiency ("30" /2008) and last Vitamin D was  at goal:  Lab Results   Component Value Date   VD25OH 75 02/10/2021     Current Outpatient Medications  Medication Instructions   acetaminophen 650 mg  Every 6 hours PRN   ALPRAZolam 1 MG tab TAKE 1/2-1 TABLET 2-3 TIMES DAILY ONLY IF NEEDED    bisoprolol  10 MG tablet Take  1 tablet  Daily    buPROPion  XL 150 MG  Take  1 tablet  Daily     ezetimibe 10 MG tablet Take  1 tablet  Daily    FLUoxetine  20 MG capsule TAKE 1 CAPSULE  DAILY    levothyroxine 50 MCG tablet TAKE 1 TABLET DAILY   NIFEdipine-XL 30 mg , Daily   omeprazole 20 MG capsule TAKE 1 CAPSULE  BKFST & EVENING MEAL    RESTASIS 0.05 % ophth  emulsion 1 drop, Both Eyes, 2 times daily PRN    Allergies  Allergen Reactions   Hyzaar [Losartan Potassium-Hctz] Swelling and     "messed up my sodium counts"and swelled lips   Sudafed [Pseudoephedrine Hcl] Palpitations   Levaquin [Levofloxacin In D5w] Diarrhea and Nausea Only   Acyclovir And Related Reaction not recalled   Biaxin [Clarithromycin] GI Upset   Cantaloupe Extract Allergy Skin Test  Nasal congestion, headaches, and sneezing  Celexa [Citalopram] Reaction not recalled   Flexeril [Cyclobenzaprine] zombie-like" feeling   Fosamax [Alendronate Sodium] GI upset   Iohexol Hives    Losartan Swelling / Angioedema   Meloxicam GI upset   Norvasc [Amlodipine] Swelling and Other (See Comments)    Ankles swell   Other Nausea Only and Other (See Comments)    Iceburg lettuce- "made me dreadfully nauseous" Raw mushrooms only-  "made me dreadfully nauseous" (cooked ones are tolerated)   Pseudoephedrine Palpitations   Zoloft [Sertraline] Has no emotions at all          Past Medical History:  Diagnosis Date   Anemia    Anxiety    Arthritis    CLL (chronic lymphocytic leukemia) ([place]) dx'd ~ 2015   Depression    GERD (gastroesophageal reflux disease)    Headache    "sinus headaches"   History of kidney stones    Hypertension    OSA (obstructive sleep apnea)    Osteoarthritis    Pneumonia     Prediabetes    Recurrent sinus infections 08/02/2012   Tubular adenoma of colon      Health Maintenance  Topic Date Due   Pneumococcal Vaccine 73-10 Years old (1 of 4 - PCV13) Never done   Zoster Vaccines- Shingrix (1 of 2) Never done   COVID-19 Vaccine (4 - Booster for Pfizer series) 01/16/2021   MAMMOGRAM  07/03/2021   INFLUENZA VACCINE  07/05/2021   TETANUS/TDAP  08/22/2028   DEXA SCAN  Completed   Hepatitis C Screening  Completed   PNA vac Low Risk Adult  Completed   HPV VACCINES  Aged Out     Immunization History  Administered Date(s) Administered   Fluad Quad(high Dose) 10/16/2020   Influenza, High Dose  08/29/2017, 08/22/2018, 09/11/2019   Influenza,inj,Quad PF 09/15/2014, 08/13/2015   Influenza 08/31/2013, 09/04/2014   PFIZER-SARS-COV-2 Vacc 01/16/2020, 02/10/2020, 10/16/2020   Pneumococcal -13 09/23/2015   Pneumococcal -23 08/29/2017   Pneumococcal-Unspecified 12/28/2010   Td 07/05/2006, 08/22/2018    Last Colon - 08/19/2020 - Dr Hilarie Fredrickson - Recc 5 yr f/u due Sept 2026   Last MGM - 02/19/22   Past Surgical History:  Procedure Laterality Date   ABDOMINAL HYSTERECTOMY  1980's   "endometrosis"   APPENDECTOMY     BREAST SURGERY     CATARACT EXTRACTION, BILATERAL     CHOLECYSTECTOMY  1985   FUNCTIONAL ENDOSCOPIC SINUS SURGERY  1990's   "cause I kept having sinus infections"   immunoglobulin treatment  2017   LAPAROSCOPIC APPENDECTOMY N/A 06/04/2014   Procedure: APPENDECTOMY LAPAROSCOPIC;  Surgeon: Zenovia Jarred, MD;  Location: [facility];  Service: General;  Laterality: N/A;   LUMBAR LAMINECTOMY/DECOMPRESSION MICRODISCECTOMY Left 11/01/2017   Procedure: Left Lumbar Four-Five Extraforaminal Microdiscectomy;  Surgeon: Eustace Moore, MD;  Location: [facility];  Service: Neurosurgery;  Laterality: Left;   LYMPH NODE BIOPSY     "determined I had CLL"   SHOULDER ARTHROSCOPY Right    TEMPOROMANDIBULAR JOINT ARTHROPLASTY  1980's   TONSILLECTOMY AND ADENOIDECTOMY  68's     Family History  Problem Relation Age of Onset   Parkinson's disease Mother    Hypertension Mother    Hypertension Maternal Grandmother    Heart attack Maternal Grandmother        Mild   Heart attack Brother    Arthritis Brother     Social History   Tobacco Use   Smoking status: Former Smoker    Packs/day: 0.75    Years: 4.00  Pack years: 3.00    Types: Cigarettes    Quit date: 12/05/1968    Years since quitting: 52.4   Smokeless tobacco: Never Used  Vaping Use   Vaping Use: Never used  Substance Use Topics   Alcohol use: Yes    Alcohol/week: 7.0 standard drinks    Types: 7 Glasses of wine per week   Drug use: No      ROS Constitutional: Denies fever, chills, weight loss/gain, headaches, insomnia,  night sweats, and change in appetite. Does c/o fatigue. Eyes: Denies redness, blurred vision, diplopia, discharge, itchy, watery eyes.  ENT: Denies discharge, congestion, post nasal drip, epistaxis, sore throat, earache, hearing loss, dental pain, Tinnitus, Vertigo, Sinus pain, snoring.  Cardio: Denies chest pain, palpitations, irregular heartbeat, syncope, dyspnea, diaphoresis, orthopnea, PND, claudication, edema Respiratory: denies cough, dyspnea, DOE, pleurisy, hoarseness, laryngitis, wheezing.  Gastrointestinal: Denies dysphagia, heartburn, reflux, water brash, pain, cramps, nausea, vomiting, bloating, diarrhea, constipation, hematemesis, melena, hematochezia, jaundice, hemorrhoids Genitourinary: Denies dysuria, frequency, urgency, nocturia, hesitancy, discharge, hematuria, flank pain Breast: Breast lumps, nipple discharge, bleeding.  Musculoskeletal: Denies arthralgia, myalgia, stiffness, Jt. Swelling, pain, limp, and strain/sprain. Denies falls. Skin: Denies puritis, rash, hives, warts, acne, eczema, changing in skin lesion Neuro: No weakness, tremor, incoordination, spasms, paresthesia, pain Psychiatric: Denies confusion, memory loss, sensory loss. Denies  Depression. Endocrine: Denies change in weight, skin, hair change, nocturia, and paresthesia, diabetic polys, visual blurring, hyper / hypo glycemic episodes.  Heme/Lymph: No excessive bleeding, bruising, enlarged lymph nodes.  Physical Exam  There were no vitals taken for this visit.  General Appearance: Over nourished, well groomed and in no apparent distress.  Eyes: PERRLA, EOMs, conjunctiva no swelling or erythema, normal fundi and vessels. Sinuses: No frontal/maxillary tenderness ENT/Mouth: EACs patent / TMs  nl. Nares clear without erythema, swelling, mucoid exudates. Oral hygiene is good. No erythema, swelling, or exudate. Tongue normal, non-obstructing. Tonsils not swollen or erythematous. Hearing normal.  Neck: Supple, thyroid not palpable. No bruits, nodes or JVD. Respiratory: Respiratory effort normal.  BS equal and clear bilateral without rales, rhonci, wheezing or stridor. Cardio: Heart sounds are normal with regular rate and rhythm and no murmurs, rubs or gallops. Peripheral pulses are normal and equal bilaterally without edema. No aortic or femoral bruits. Chest: symmetric with normal excursions and percussion. Breasts: Deferred to 02/20/2012 MGM Abdomen: Flat, soft with bowel sounds active. Nontender, no guarding, rebound, hernias, masses, or organomegaly.  Lymphatics: Non tender without lymphadenopathy.  Musculoskeletal: Full ROM all peripheral extremities, joint stability, 5/5 strength, and normal gait. Skin: Warm and dry without rashes, lesions, cyanosis, clubbing or  ecchymosis.  Neuro: Cranial nerves intact, reflexes equal bilaterally. Normal muscle tone, no cerebellar symptoms. Sensation intact.  Pysch: Alert and oriented X 3, normal affect, Insight and Judgment appropriate.    Assessment and Plan  1. Annual Preventative Screening Examination   2. Essential hypertension  - EKG 12-Lead - Urinalysis, Routine w reflex microscopic - Microalbumin / creatinine urine  ratio - CBC with Differential/Platelet - COMPLETE METABOLIC PANEL WITH GFR - Magnesium - TSH  3. Hyperlipidemia, mixed  - EKG 12-Lead - Lipid panel - TSH  4. Abnormal glucose  - EKG 12-Lead - Hemoglobin A1c - Insulin, random  5. Vitamin D deficiency  - VITAMIN D 25 Hydroxy   6. CLL (chronic lymphocytic leukemia) ([facility])  - CBC with Differential/Platelet  7. Chronic renal failure, stage 3a ([facility])  - COMPLETE METABOLIC PANEL WITH GFR  8. Depression, major, in remission ([place])  - TSH  9. OSA (obstructive sleep apnea)   10. Statin myopathy  - Lipid panel  11. Atherosclerosis of aorta ([facility])  - EKG 12-Lead - Lipid panel  12. Screening for heart disease  - EKG 12-Lead  13. FHx: heart disease  - EKG 12-Lead  14. Former smoker  - EKG 12-Lead  15. Screening for colorectal cancer  - POC Hemoccult Bld/Stl   16. Medication management  - Urinalysis, Routine w reflex microscopic - Microalbumin / creatinine urine ratio - CBC with Differential/Platelet - COMPLETE METABOLIC PANEL WITH GFR - Magnesium - Lipid panel - TSH - Hemoglobin A1c - Insulin, random - VITAMIN D 25 Hydroxy         Patient was counseled in prudent diet to achieve/maintain BMI less than 25 for weight control, BP monitoring, regular exercise and medications. Discussed med's effects and SE's. Screening labs and tests as requested with regular follow-up as recommended. Over 40 minutes of exam, counseling, chart review and high complex critical decision making was performed.   Kirtland Bouchard, MD

## 2022-08-22 ENCOUNTER — Ambulatory Visit (INDEPENDENT_AMBULATORY_CARE_PROVIDER_SITE_OTHER): Payer: Medicare Other | Admitting: Internal Medicine

## 2022-08-22 ENCOUNTER — Encounter: Payer: Self-pay | Admitting: Internal Medicine

## 2022-08-22 VITALS — BP 148/80 | HR 87 | Temp 97.5°F | Resp 19 | Ht 62.0 in | Wt 201.8 lb

## 2022-08-22 DIAGNOSIS — Z136 Encounter for screening for cardiovascular disorders: Secondary | ICD-10-CM | POA: Diagnosis not present

## 2022-08-22 DIAGNOSIS — R7309 Other abnormal glucose: Secondary | ICD-10-CM | POA: Diagnosis not present

## 2022-08-22 DIAGNOSIS — E782 Mixed hyperlipidemia: Secondary | ICD-10-CM

## 2022-08-22 DIAGNOSIS — I10 Essential (primary) hypertension: Secondary | ICD-10-CM | POA: Diagnosis not present

## 2022-08-22 DIAGNOSIS — F331 Major depressive disorder, recurrent, moderate: Secondary | ICD-10-CM

## 2022-08-22 DIAGNOSIS — Z0001 Encounter for general adult medical examination with abnormal findings: Secondary | ICD-10-CM

## 2022-08-22 DIAGNOSIS — F419 Anxiety disorder, unspecified: Secondary | ICD-10-CM

## 2022-08-22 DIAGNOSIS — Z8249 Family history of ischemic heart disease and other diseases of the circulatory system: Secondary | ICD-10-CM | POA: Diagnosis not present

## 2022-08-22 DIAGNOSIS — Z87891 Personal history of nicotine dependence: Secondary | ICD-10-CM | POA: Diagnosis not present

## 2022-08-22 DIAGNOSIS — N1831 Chronic kidney disease, stage 3a: Secondary | ICD-10-CM | POA: Diagnosis not present

## 2022-08-22 DIAGNOSIS — T466X5A Adverse effect of antihyperlipidemic and antiarteriosclerotic drugs, initial encounter: Secondary | ICD-10-CM | POA: Diagnosis not present

## 2022-08-22 DIAGNOSIS — Z0000 Encounter for general adult medical examination without abnormal findings: Secondary | ICD-10-CM

## 2022-08-22 DIAGNOSIS — Z79899 Other long term (current) drug therapy: Secondary | ICD-10-CM | POA: Diagnosis not present

## 2022-08-22 DIAGNOSIS — E559 Vitamin D deficiency, unspecified: Secondary | ICD-10-CM

## 2022-08-22 DIAGNOSIS — G4733 Obstructive sleep apnea (adult) (pediatric): Secondary | ICD-10-CM

## 2022-08-22 DIAGNOSIS — C9110 Chronic lymphocytic leukemia of B-cell type not having achieved remission: Secondary | ICD-10-CM

## 2022-08-22 DIAGNOSIS — I700 Atherosclerosis of aorta: Secondary | ICD-10-CM

## 2022-08-22 DIAGNOSIS — F325 Major depressive disorder, single episode, in full remission: Secondary | ICD-10-CM

## 2022-08-22 DIAGNOSIS — G720 Drug-induced myopathy: Secondary | ICD-10-CM

## 2022-08-22 DIAGNOSIS — Z1211 Encounter for screening for malignant neoplasm of colon: Secondary | ICD-10-CM

## 2022-08-22 MED ORDER — BISOPROLOL-HYDROCHLOROTHIAZIDE 10-6.25 MG PO TABS: ORAL_TABLET | ORAL | 3 refills | Status: DC

## 2022-08-22 MED ORDER — BUPROPION HCL ER (XL) 300 MG PO TB24: ORAL_TABLET | ORAL | 3 refills | Status: DC

## 2022-08-22 MED ORDER — PANTOPRAZOLE SODIUM 40 MG PO TBEC: DELAYED_RELEASE_TABLET | ORAL | 3 refills | Status: DC

## 2022-08-22 MED ORDER — FLUOXETINE HCL 40 MG PO CAPS: ORAL_CAPSULE | ORAL | 3 refills | Status: DC

## 2022-08-23 ENCOUNTER — Other Ambulatory Visit: Payer: Self-pay | Admitting: Internal Medicine

## 2022-08-23 DIAGNOSIS — N3000 Acute cystitis without hematuria: Secondary | ICD-10-CM

## 2022-08-23 LAB — TSH: TSH: 2.5 mIU/L (ref 0.40–4.50)

## 2022-08-23 LAB — VITAMIN D 25 HYDROXY (VIT D DEFICIENCY, FRACTURES): Vit D, 25-Hydroxy: 37 ng/mL (ref 30–100)

## 2022-08-23 LAB — URINALYSIS, ROUTINE W REFLEX MICROSCOPIC
Bacteria, UA: NONE SEEN /HPF
Bilirubin Urine: NEGATIVE
Glucose, UA: NEGATIVE
Hgb urine dipstick: NEGATIVE
Ketones, ur: NEGATIVE
Nitrite: NEGATIVE
Protein, ur: NEGATIVE
RBC / HPF: NONE SEEN /HPF (ref 0–2)
Specific Gravity, Urine: 1.008 (ref 1.001–1.035)
pH: 5.5 (ref 5.0–8.0)

## 2022-08-23 LAB — COMPLETE METABOLIC PANEL WITH GFR
AG Ratio: 2.1 (calc) (ref 1.0–2.5)
ALT: 17 U/L (ref 6–29)
AST: 24 U/L (ref 10–35)
Albumin: 4.5 g/dL (ref 3.6–5.1)
Alkaline phosphatase (APISO): 80 U/L (ref 37–153)
BUN: 15 mg/dL (ref 7–25)
CO2: 24 mmol/L (ref 20–32)
Calcium: 9.2 mg/dL (ref 8.6–10.4)
Chloride: 96 mmol/L — ABNORMAL LOW (ref 98–110)
Creat: 1 mg/dL (ref 0.60–1.00)
Globulin: 2.1 g/dL (calc) (ref 1.9–3.7)
Glucose, Bld: 93 mg/dL (ref 65–99)
Potassium: 4.4 mmol/L (ref 3.5–5.3)
Sodium: 132 mmol/L — ABNORMAL LOW (ref 135–146)
Total Bilirubin: 0.6 mg/dL (ref 0.2–1.2)
Total Protein: 6.6 g/dL (ref 6.1–8.1)
eGFR: 59 mL/min/{1.73_m2} — ABNORMAL LOW (ref >=60)

## 2022-08-23 LAB — MICROALBUMIN / CREATININE URINE RATIO
Creatinine, Urine: 88 mg/dL (ref 20–275)
Microalb Creat Ratio: 45 mcg/mg creat — ABNORMAL HIGH (ref <30)
Microalb, Ur: 4 mg/dL

## 2022-08-23 LAB — CBC WITH DIFFERENTIAL/PLATELET
Absolute Monocytes: 498 cells/uL (ref 200–950)
Basophils Absolute: 50 cells/uL (ref 0–200)
Basophils Relative: 0.1 %
Eosinophils Absolute: 299 cells/uL (ref 15–500)
Eosinophils Relative: 0.6 %
HCT: 32.4 % — ABNORMAL LOW (ref 35.0–45.0)
Hemoglobin: 11 g/dL — ABNORMAL LOW (ref 11.7–15.5)
Lymphs Abs: 44820 cells/uL — ABNORMAL HIGH (ref 850–3900)
MCH: 34.5 pg — ABNORMAL HIGH (ref 27.0–33.0)
MCHC: 34 g/dL (ref 32.0–36.0)
MCV: 101.6 fL — ABNORMAL HIGH (ref 80.0–100.0)
MPV: 8.5 fL (ref 7.5–12.5)
Monocytes Relative: 1 %
Neutro Abs: 4133 cells/uL (ref 1500–7800)
Neutrophils Relative %: 8.3 %
Platelets: 114 10*3/uL — ABNORMAL LOW (ref 140–400)
RBC: 3.19 10*6/uL — ABNORMAL LOW (ref 3.80–5.10)
RDW: 13 % (ref 11.0–15.0)
Total Lymphocyte: 90 %
WBC: 49.8 10*3/uL — ABNORMAL HIGH (ref 3.8–10.8)

## 2022-08-23 LAB — LIPID PANEL
Cholesterol: 200 mg/dL — ABNORMAL HIGH (ref <200)
HDL: 70 mg/dL (ref >=50)
LDL Cholesterol (Calc): 109 mg/dL (calc) — ABNORMAL HIGH
Non-HDL Cholesterol (Calc): 130 mg/dL (calc) — ABNORMAL HIGH (ref <130)
Total CHOL/HDL Ratio: 2.9 (calc) (ref <5.0)
Triglycerides: 106 mg/dL (ref <150)

## 2022-08-23 LAB — INSULIN, RANDOM: Insulin: 18.3 u[IU]/mL

## 2022-08-23 LAB — HEMOGLOBIN A1C
Hgb A1c MFr Bld: 5.1 % of total Hgb (ref <5.7)
Mean Plasma Glucose: 100 mg/dL
eAG (mmol/L): 5.5 mmol/L

## 2022-08-23 LAB — MAGNESIUM: Magnesium: 1.5 mg/dL (ref 1.5–2.5)

## 2022-08-23 LAB — MICROSCOPIC MESSAGE

## 2022-08-23 NOTE — Progress Notes (Signed)
/<><><><><><><><><><><><><><><><><><><><><><><><><><><><><><><><><> <><><><><><><><><><><><><><><><><><><><><><><><><><><><><><><><><>  - U/A suspicious for UTI - Will  request U/C  <><><><><><><><><><><><><><><><><><><><><><><><><><><><><><><><><> <><><><><><><><><><><><><><><><><><><><><><><><><><><><><><><><><>  -   with CLL  Red  cell ct / Hgb = 11.0 gm% is stable......  ..... WBC is down from 95,800     3 weeks ago to now 29,937   <><><><><><><><><><><><><><><><><><><><><><><><><><><><><><><><><>  -  -  Total  Chol = 200 - worse- gone up from 144            (  Ideal  or  Goal is less than 180  !  )  & -  Bad / Dangerous LDL  Chol =  109 - also Elevated - gone up from 55              (  Ideal  or  Goal is less than 70  !  )    - So need to work harder on a   low cholesterol diet   - Cholesterol only comes from animal sources                                                                         - ie. meat, dairy, egg yolks  - Eat all the vegetables you want.  - Avoid Meat, Avoid Meat,  Avoid Meat                                                      - especially Red Meat - Beef AND Pork .  - Avoid cheese & dairy - milk & ice cream.     - Cheese is the most concentrated form of trans-fats which                                                         is the worst thing to clog up our arteries.   - Veggie cheese is OK which can be found in the fresh produce section at                                                                [employer] or [employer] or [employer] <><><><><><><><><><><><><><><><><><><><><><><><><><><><><><><><><>  -  A1c - Normal - No Diabetes  - Great                               ! <><><><><><><><><><><><><><><><><><><><><><><><><><><><><><><><><>  -  Vitamin D = 37  is extremely low  -   - Vitamin D goal is between 70-100.   - Please INCREASE your Vitamin D by 5,000 units MORE / day   - It is very important as a natural anti-inflammatory and  helping the  immune system protect against viral infections, like the Covid-19    helping hair, skin, and nails, as well as reducing stroke and heart attack risk.   - It helps your bones and helps with mood.  - It also decreases numerous cancer risks so please  take it as directed.   - Low Vit D is associated with a 200-300% higher risk for CANCER   and 200-300% higher risk for HEART   ATTACK  &  STROKE.    - It is also associated with higher death rate at younger ages,   autoimmune diseases like Rheumatoid arthritis, Lupus, Multiple Sclerosis.     - Also many other serious conditions, like depression, Alzheimer's Dementia,                                                             infertility, muscle aches, fatigue, fibromyalgia   <><><><><><><><><><><><><><><><><><><><><><><><><><><><><><><><><> <><><><><><><><><><><><><><><><><><><><><><><><><><><><><><><><><>  -  All Else - CBC - Kidneys - Electrolytes - Liver - Magnesium & Thyroid    - all  Normal / OK <><><><><><><><><><><><><><><><><><><><><><><><><><><><><><><><><> <><><><><><><><><><><><><><><><><><><><><><><><><><><><><><><><><>

## 2022-08-24 DIAGNOSIS — M48062 Spinal stenosis, lumbar region with neurogenic claudication: Secondary | ICD-10-CM | POA: Diagnosis not present

## 2022-08-24 DIAGNOSIS — N3000 Acute cystitis without hematuria: Secondary | ICD-10-CM | POA: Diagnosis not present

## 2022-08-24 DIAGNOSIS — Z6835 Body mass index (BMI) 35.0-35.9, adult: Secondary | ICD-10-CM | POA: Diagnosis not present

## 2022-08-25 LAB — URINE CULTURE
MICRO NUMBER:: 13945571
SPECIMEN QUALITY:: ADEQUATE

## 2022-08-26 NOTE — Progress Notes (Signed)
<><><><><><><><><><><><><><><><><><><><><><><><><><><><><><><><><> <><><><><><><><><><><><><><><><><><><><><><><><><><><><><><><><><>  -   U/C  - No Growth - No Infection - No Abx needed   <><><><><><><><><><><><><><><><><><><><><><><><><><><><><><><><><> <><><><><><><><><><><><><><><><><><><><><><><><><><><><><><><><><>

## 2022-09-01 NOTE — Progress Notes (Signed)
Patient is aware of lab results. -e welch

## 2022-09-12 DIAGNOSIS — M47816 Spondylosis without myelopathy or radiculopathy, lumbar region: Secondary | ICD-10-CM | POA: Diagnosis not present

## 2022-09-13 DIAGNOSIS — Z6834 Body mass index (BMI) 34.0-34.9, adult: Secondary | ICD-10-CM | POA: Diagnosis not present

## 2022-09-13 DIAGNOSIS — M48062 Spinal stenosis, lumbar region with neurogenic claudication: Secondary | ICD-10-CM | POA: Diagnosis not present

## 2022-09-20 ENCOUNTER — Ambulatory Visit: Payer: Medicare Other | Attending: Neurosurgery | Admitting: Physical Therapy

## 2022-09-20 DIAGNOSIS — R2689 Other abnormalities of gait and mobility: Secondary | ICD-10-CM | POA: Insufficient documentation

## 2022-09-20 DIAGNOSIS — M5459 Other low back pain: Secondary | ICD-10-CM | POA: Insufficient documentation

## 2022-09-20 DIAGNOSIS — M6281 Muscle weakness (generalized): Secondary | ICD-10-CM | POA: Diagnosis not present

## 2022-09-20 DIAGNOSIS — R2681 Unsteadiness on feet: Secondary | ICD-10-CM | POA: Insufficient documentation

## 2022-09-20 NOTE — Therapy (Unsigned)
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   Depression    GERD (gastroesophageal reflux disease)    Headache    "sinus headaches"   History of kidney stones    Hypertension    OSA (obstructive sleep apnea)    Osteoarthritis    Pneumonia    Prediabetes    Recurrent sinus infections 08/02/2012   Tubular adenoma of colon    Past Surgical History:  Procedure Laterality Date   ABDOMINAL HYSTERECTOMY  1980's   "endometrosis"   APPENDECTOMY     BREAST SURGERY     CATARACT EXTRACTION, BILATERAL     CHOLECYSTECTOMY  1985   FUNCTIONAL ENDOSCOPIC SINUS SURGERY  1990's   "cause I kept having sinus infections"   immunoglobulin treatment  2017   LAPAROSCOPIC APPENDECTOMY N/A 06/04/2014   Procedure: APPENDECTOMY LAPAROSCOPIC;  Surgeon: Zenovia Jarred, MD;  Location: [facility];  Service: General;  Laterality: N/A;   LUMBAR LAMINECTOMY/DECOMPRESSION MICRODISCECTOMY Left 11/01/2017   Procedure: Left Lumbar Four-Five Extraforaminal Microdiscectomy;  Surgeon: Eustace Moore, MD;  Location: [facility];  Service: Neurosurgery;  Laterality: Left;   LYMPH NODE BIOPSY     "determined I had CLL"   SHOULDER ARTHROSCOPY Right    TEMPOROMANDIBULAR JOINT ARTHROPLASTY  1980's   TONSILLECTOMY AND ADENOIDECTOMY  1950's   Patient Active Problem List   Diagnosis Date Noted   Fatty liver 02/01/2022   Pneumonia due to COVID-19 virus 09/08/2021   Norovirus 04/10/2020   AKI (acute kidney injury) ([place]) 04/08/2020   Anxiety with depression 04/08/2020   Hyperbilirubinemia 04/08/2020   Hypocalcemia 04/08/2020   Hypomagnesemia 04/08/2020   Chronic bronchitis, unspecified chronic bronchitis type ([place]) 03/12/2020   Atherosclerosis of  aorta ([place]) by CT abd on 04/08/2020 03/12/2020   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   S/P lumbar laminectomy 11/01/2017   Thrombocytopenia ([place]) 05/02/2016   Immunocompromised ([place]) 10/10/2015   BMI 37.85,   adult 09/23/2015   Environmental and seasonal allergies 08/15/2015   Mixed hyperlipidemia 06/01/2015   Medication management 06/01/2015   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  Rationale for Evaluation and Treatment {HABREHAB:27488}  SUBJECTIVE:  SUBJECTIVE STATEMENT: *** Pt accompanied by: {accompnied:27141}  PERTINENT HISTORY: ***  PAIN:  Are you having pain? {OPRCPAIN:27236}  PRECAUTIONS: {Therapy precautions:24002}  WEIGHT BEARING RESTRICTIONS {Yes ***/No:24003}  FALLS: Has patient fallen in last 6 months? {fallsyesno:27318}  LIVING ENVIRONMENT: Lives with: {OPRC lives with:25569::"lives with their family"} Lives in: {Lives in:25570} Stairs: {opstairs:27293} Has following equipment at home: {Assistive devices:23999}  PLOF: {PLOF:24004}  PATIENT GOALS ***  OBJECTIVE:   DIAGNOSTIC FINDINGS: ***  COGNITION: Overall cognitive status: {cognition:24006}   SENSATION: {sensation:27233}  COORDINATION: ***  EDEMA:   {edema:24020}  MUSCLE TONE: {LE tone:25568}   MUSCLE LENGTH: Hamstrings: Right *** deg; Left *** deg Thomas test: Right *** deg; Left *** deg  DTRs:  {DTR SITE:24025}  POSTURE: {posture:25561}  LOWER EXTREMITY ROM:     {AROM/PROM:27142}  Right Eval Left Eval  Hip flexion    Hip extension    Hip abduction    Hip adduction    Hip internal rotation    Hip external rotation    Knee flexion    Knee extension    Ankle dorsiflexion    Ankle plantarflexion    Ankle inversion    Ankle eversion     (Blank rows = not tested)  LOWER EXTREMITY MMT:    MMT Right Eval Left Eval  Hip flexion    Hip extension    Hip abduction    Hip adduction    Hip internal rotation    Hip external rotation    Knee flexion    Knee extension    Ankle dorsiflexion    Ankle plantarflexion    Ankle inversion    Ankle eversion    (Blank rows = not tested)  BED MOBILITY:  {Bed mobility:24027}  TRANSFERS: Assistive device utilized: {Assistive devices:23999}  Sit to stand: {Levels of assistance:24026} Stand to sit: {Levels of assistance:24026} Chair to chair: {Levels of assistance:24026} Floor: {Levels of assistance:24026}  RAMP:  Level of Assistance: {Levels of assistance:24026} Assistive device utilized: {Assistive devices:23999} Ramp Comments: ***  CURB:  Level of Assistance: {Levels of assistance:24026} Assistive device utilized: {Assistive devices:23999} Curb Comments: ***  STAIRS:  Level of Assistance: {Levels of assistance:24026}  Stair Negotiation Technique: {Stair Technique:27161} with {Rail Assistance:27162}  Number of Stairs: ***   Height of Stairs: ***  Comments: ***  GAIT: Gait pattern: {gait characteristics:25376} Distance walked: *** Assistive device utilized: {Assistive devices:23999} Level of assistance: {Levels of assistance:24026} Comments: ***  FUNCTIONAL TESTs:  {Functional tests:24029}  PATIENT SURVEYS:  {rehab surveys:24030}  TODAY'S  TREATMENT:  ***   PATIENT EDUCATION: Education details: *** Person educated: {Person educated:25204} Education method: {Education Method:25205} Education comprehension: {Education Comprehension:25206}   HOME EXERCISE PROGRAM: ***    GOALS: Goals reviewed with patient? {yes/no:20286}  SHORT TERM GOALS: Target date: {follow up:25551}  *** Baseline: Goal status: {GOALSTATUS:25110}  2.  *** Baseline:  Goal status: {GOALSTATUS:25110}  3.  *** Baseline:  Goal status: {GOALSTATUS:25110}  4.  *** Baseline:  Goal status: {GOALSTATUS:25110}  5.  *** Baseline:  Goal status: {GOALSTATUS:25110}  6.  *** Baseline:  Goal status: {GOALSTATUS:25110}  LONG TERM GOALS: Target date: {follow up:25551}  *** Baseline:  Goal status: {GOALSTATUS:25110}  2.  *** Baseline:  Goal status: {GOALSTATUS:25110}  3.  *** Baseline:  Goal status: {GOALSTATUS:25110}  4.  *** Baseline:  Goal status: {GOALSTATUS:25110}  5.  *** Baseline:  Goal status: {GOALSTATUS:25110}  6.  *** Baseline:  Goal status: {GOALSTATUS:25110}  ASSESSMENT:  CLINICAL IMPRESSION: Patient is a *** y.o. *** who was seen today  for physical therapy evaluation and treatment for ***.    OBJECTIVE IMPAIRMENTS {opptimpairments:25111}.   ACTIVITY LIMITATIONS {activitylimitations:27494}  PARTICIPATION LIMITATIONS: {participationrestrictions:25113}  PERSONAL FACTORS {Personal factors:25162} are also affecting patient's functional outcome.   REHAB POTENTIAL: {rehabpotential:25112}  CLINICAL DECISION MAKING: {clinical decision making:25114}  EVALUATION COMPLEXITY: {Evaluation complexity:25115}  PLAN: PT FREQUENCY: {rehab frequency:25116}  PT DURATION: {rehab duration:25117}  PLANNED INTERVENTIONS: {rehab planned interventions:25118::"Therapeutic exercises","Therapeutic activity","Neuromuscular re-education","Balance training","Gait training","Patient/Family education","Self Care","Joint  mobilization"}  PLAN FOR NEXT SESSION: ***   Alda Lea, PT 09/20/2022, 3:40 PM        OUTPATIENT PHYSICAL THERAPY NEURO EVALUATION   Patient Name: Rebecca Bond MRN: [id number] DOB:[birth year], 76 y.o., female Today's Date: 09/20/2022   PCP: [name], MD REFERRING PROVIDER: Gentry Fitz, MD      Past Medical History:  Diagnosis Date   Anemia    Anxiety    Arthritis    CLL (chronic lymphocytic leukemia) ([place]) dx'd ~ 2015   Depression    GERD (gastroesophageal reflux disease)    Headache    "sinus headaches"   History of kidney stones    Hypertension    OSA (obstructive sleep apnea)    Osteoarthritis    Pneumonia    Prediabetes    Recurrent sinus infections 08/02/2012   Tubular adenoma of colon    Past Surgical History:  Procedure Laterality Date   ABDOMINAL HYSTERECTOMY  1980's   "endometrosis"   APPENDECTOMY     BREAST SURGERY     CATARACT EXTRACTION, BILATERAL     CHOLECYSTECTOMY  1985   FUNCTIONAL ENDOSCOPIC SINUS SURGERY  1990's   "cause I kept having sinus infections"   immunoglobulin treatment  2017   LAPAROSCOPIC APPENDECTOMY N/A 06/04/2014   Procedure: APPENDECTOMY LAPAROSCOPIC;  Surgeon: Zenovia Jarred, MD;  Location: [facility];  Service: General;  Laterality: N/A;   LUMBAR LAMINECTOMY/DECOMPRESSION MICRODISCECTOMY Left 11/01/2017   Procedure: Left Lumbar Four-Five Extraforaminal Microdiscectomy;  Surgeon: Eustace Moore, MD;  Location: [facility];  Service: Neurosurgery;  Laterality: Left;   LYMPH NODE BIOPSY     "determined I had CLL"   SHOULDER ARTHROSCOPY Right    TEMPOROMANDIBULAR JOINT ARTHROPLASTY  1980's   TONSILLECTOMY AND ADENOIDECTOMY  1950's   Patient Active Problem List   Diagnosis Date Noted   Fatty liver 02/01/2022   Pneumonia due to COVID-19 virus 09/08/2021   Norovirus 04/10/2020   AKI (acute kidney injury) ([place]) 04/08/2020   Anxiety with depression 04/08/2020   Hyperbilirubinemia 04/08/2020    Hypocalcemia 04/08/2020   Hypomagnesemia 04/08/2020   Chronic bronchitis, unspecified chronic bronchitis type ([place]) 03/12/2020   Atherosclerosis of aorta ([place]) by CT abd on 04/08/2020 03/12/2020   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   S/P lumbar laminectomy 11/01/2017   Thrombocytopenia ([place]) 05/02/2016   Immunocompromised ([place]) 10/10/2015   BMI 37.85,   adult 09/23/2015   Environmental and seasonal allergies 08/15/2015   Mixed hyperlipidemia 06/01/2015   Medication management 06/01/2015   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  Rationale for Evaluation and Treatment Rehabilitation  Subjective:  Pt reports she had 4 injections last Tuesday, July 11; had been 4 months prior to that since she received other injections for back pain;  pt states she is unable to stand for prolonged time period without having severe back pain.  Pt states she has never used any device for assistance with ambulation; walks slower because of the back pain.   Pt accompanied by: family member  PERTINENT HISTORY: LUMBAR LAMINECTOMY/DECOMPRESSION MICRODISCECTOMYLeft11/28/2018, CLL (chronic lymphocytic leukemia, HTN, depression  PAIN  Are you having pain? Yes: NPRS scale: 8/10 Pain location: low back and into legs and over Lt SI joint Pain description: soreness, "hurting" Aggravating factors: standing, walking Relieving  factors: sitting or lying down; non-weightbearing positions  injections scheduled for next Tuesday, the 24th States pain varies with weight bearing; unable to stand for prolonged time in the kitchen  PRECAUTIONS: None  WEIGHT BEARING RESTRICTIONS No  FALLS: Has patient fallen in last 6 months? No  LIVING ENVIRONMENT: Lives with: lives with their spouse Lives in: House/apartment Stairs: Yes: Internal: 12 steps; on right going up and External: 2 steps; none Has following equipment at home: None  PLOF: Independent with basic ADLs, Independent with household mobility without device, Independent with community mobility without device, Independent with gait, Independent with transfers, and Needs assistance with homemaking  PATIENT GOALS I want to be able to walk; would like to bend over to load/unload dishwasher be able to play with great-grandson more  OBJECTIVE:   DIAGNOSTIC FINDINGS: N/A  COGNITION: Overall cognitive status: Within functional limits for tasks assessed   SENSATION: WFL  COORDINATION: WFL's    POSTURE: rounded shoulders, forward head, and increased thoracic kyphosis  LOWER EXTREMITY ROM:   WFL's   LOWER EXTREMITY MMT:    MMT Right Eval Left Eval  Hip flexion 4 4  Hip extension    Hip abduction    Hip adduction    Hip internal rotation    Hip external rotation    Knee flexion 4 4  Knee extension 5 5  Ankle dorsiflexion 5 5  Ankle plantarflexion    Ankle inversion    Ankle eversion    (Blank rows = not tested)  BED MOBILITY:  Independent  TRANSFERS: Assistive device utilized: None  Sit to stand: Modified independence Stand to sit: Modified independence  GAIT: Gait pattern: step through pattern and antalgic Distance walked: 350' in 2" 51 secs Assistive device utilized: None Level of assistance: SBA Comments: slow gait speed due to back pain; pt fatigued after amb. 2"51 secs nonstop without device Gait velocity = 25.22 secs = 1.30 ft/sec  without device  FUNCTIONAL TESTs:  5 times sit to stand: 20.75 secs without UE support - from standard chair Timed up and go (TUG): 15.62 secs  3 minute walk test: 350' in 2"51 secs no device Balance tests; Romberg EO and EC 30 secs RLE SLS 3.06 secs:  LLE SLS 3.31 secs Tandem stance 30 secs  PATIENT EDUCATION: Education details: Eval results; benefit of aquatic exercise Person educated: Patient Education method: Explanation Education comprehension: verbalized understanding   HOME EXERCISE PROGRAM: To be issued     GOALS: Goals reviewed with patient? Yes  SHORT TERM GOALS: Target date: 07/22/2022  Improve TUG score to </= 13.0 secs without device for reduced fall risk. Baseline:  15.62 secs Goal status: INITIAL  2.  Increase gait velocity to >/= 1.70 ft/sec without device for increased gait efficiency. Baseline: 1.30 ft/sec (25.22 secs) Goal status: INITIAL  3.  Pt will improve 5 times sit to stand score to </= 17 secs without UE support to demo increased LE strength.  Baseline: 20.75 secs without UE support Goal status: INITIAL  4.  Pt will amb. 5" nonstop without device with c/o back pain </= 5/10 for increased activity tolerance. Baseline: 2" 51 secs 350'  Goal status: INITIAL  5.  Pt will report ability to stand for at least 15" to perform kitchen/cooking activity with c/o back pain </= 6/10. Baseline: 5" standing - back pain 8/10 intensity Goal status: INITIAL  6.  Independent in HEP for LE strengthening and balance exercises, including walking program. Baseline:  Goal status: INITIAL  LONG TERM GOALS: Target date: 08/19/2022  Improve TUG score to </= 11.0 secs without device for reduced fall risk. Baseline:  15.62 secs Goal status: INITIAL  2.  Increase gait velocity to >/= 2.0 ft/sec without device for increased gait efficiency. Baseline: 1.30 ft/sec (25.22 secs) Goal status: INITIAL  3.  Pt will amb. 10" nonstop without device with c/o back pain  </= 4/10 for increased activity tolerance. Baseline: 2" 51 secs 350'  Goal status: INITIAL  4.  Pt will report ability to stand for at least 30" to perform kitchen/cooking activity with c/o back pain </= 5/10. Baseline: 5" standing - back pain 8/10 intensity Goal status: INITIAL  5.  Perform floor to stand transfer with UE support with SBA Baseline: To be assessed Goal status: INITIAL  6.  Independent in HEP including strengthening exercises and aquatic exercises to be continued upon discharge. Baseline: To be established Goal status: INITIAL  ASSESSMENT:  CLINICAL IMPRESSION: Patient is a 76 y.o. lady who was seen today for physical therapy evaluation and treatment for LE weakness, gait and balance deficits due to lumbar spondylosis.   Pt presents with LE weakness with LLE slightly weaker than RLE with greater proximal weakness than distal.  Pt is unable to stand > 5" without moderate to severe back pain and is unable to amb. prolonged distances due to back pain.  Pt has decreased gait speed with velocity 1.30 ft/sec without use of device; states she walks slower to reduce back pain.  Pt is at fall risk per TUG score of 15.62 secs.  Pt is unable to perform SLS >3 secs on either leg.  Pt able to amb. 3" prior to fatigue and needing seated rest break.  Pt will benefit from land and aquatic based PT to address bil. LE weakness, gait and balance deficits and decreased endurance/activity tolerance.    OBJECTIVE IMPAIRMENTS decreased activity tolerance, decreased balance, decreased endurance, difficulty walking, decreased strength, and pain.   ACTIVITY LIMITATIONS carrying, lifting, bending, standing, squatting, stairs, and locomotion level  PARTICIPATION LIMITATIONS: meal prep, cleaning, laundry, shopping, and community activity  PERSONAL FACTORS Age, Fitness, Past/current experiences, and 1 comorbidity: chronic low back pain  are also affecting patient's functional outcome.   REHAB  POTENTIAL: Good  CLINICAL DECISION MAKING: Evolving/moderate complexity  EVALUATION COMPLEXITY: Moderate  PLAN: PT FREQUENCY: 2x/week  PT DURATION: 8 weeks  PLANNED INTERVENTIONS: Therapeutic exercises, Therapeutic activity, Neuromuscular re-education, Balance training, Gait training, Patient/Family education, Self Care, Stair training, DME instructions, and Aquatic Therapy  PLAN FOR NEXT SESSION: Aquatic therapy to start 06-29-22:  begin LE strengthening exs on land; issue HEP    Kashon Kraynak, [place], PT 09/20/2022, 3:41 PM

## 2022-09-21 ENCOUNTER — Encounter: Payer: Self-pay | Admitting: Physical Therapy

## 2022-09-21 NOTE — Progress Notes (Signed)
   09/21/22 0001  Berg Balance Test  Sit to Stand 4  Standing Unsupported 4  Sitting with Back Unsupported but Feet Supported on Floor or Stool 4  Stand to Sit 4  Transfers 4  Standing Unsupported with Eyes Closed 4  Standing Unsupported with Feet Together 4  From Standing, Reach Forward with Outstretched Arm 4  From Standing Position, Pick up Object from Floor 3  From Standing Position, Turn to Look Behind Over each Shoulder 4  Turn 360 Degrees 4  Standing Unsupported, Alternately Place Feet on Step/Stool 1  Standing Unsupported, One Foot in Front 3 (Rt foot in front)  Standing on One Leg 2  Total Score 49

## 2022-09-27 DIAGNOSIS — M47816 Spondylosis without myelopathy or radiculopathy, lumbar region: Secondary | ICD-10-CM | POA: Diagnosis not present

## 2022-09-29 ENCOUNTER — Ambulatory Visit: Payer: Medicare Other | Admitting: Nurse Practitioner

## 2022-10-04 ENCOUNTER — Ambulatory Visit: Payer: Medicare Other | Admitting: Physical Therapy

## 2022-10-04 ENCOUNTER — Encounter: Payer: Self-pay | Admitting: Physical Therapy

## 2022-10-04 DIAGNOSIS — R2689 Other abnormalities of gait and mobility: Secondary | ICD-10-CM | POA: Diagnosis not present

## 2022-10-04 DIAGNOSIS — H0101A Ulcerative blepharitis right eye, upper and lower eyelids: Secondary | ICD-10-CM | POA: Diagnosis not present

## 2022-10-04 DIAGNOSIS — M5459 Other low back pain: Secondary | ICD-10-CM | POA: Diagnosis not present

## 2022-10-04 DIAGNOSIS — M6281 Muscle weakness (generalized): Secondary | ICD-10-CM

## 2022-10-04 DIAGNOSIS — H40013 Open angle with borderline findings, low risk, bilateral: Secondary | ICD-10-CM | POA: Diagnosis not present

## 2022-10-04 DIAGNOSIS — R2681 Unsteadiness on feet: Secondary | ICD-10-CM | POA: Diagnosis not present

## 2022-10-04 DIAGNOSIS — H1013 Acute atopic conjunctivitis, bilateral: Secondary | ICD-10-CM | POA: Diagnosis not present

## 2022-10-04 DIAGNOSIS — H0101B Ulcerative blepharitis left eye, upper and lower eyelids: Secondary | ICD-10-CM | POA: Diagnosis not present

## 2022-10-04 NOTE — Therapy (Signed)
OUTPATIENT PHYSICAL THERAPY NEURO TREATMENT NOTE                                                                                                                                                                Patient Name: Rebecca Bond MRN: [id number] DOB:[birth year], 76 y.o., female Today's Date: 10/06/2022   PCP: [name], MD REFERRING PROVIDER: Earnie Larsson, MD     PT End of Session - 10/06/22 [phone]     Visit Number 2    Number of Visits 17    Date for PT Re-Evaluation 11/18/22    Authorization Type BCBS Medicare    Authorization Time Period 09-20-22 - 12-04-22    PT Start Time 1536    PT Stop Time 7741    PT Time Calculation (min) 41 min    Equipment Utilized During Treatment Gait belt    Activity Tolerance Patient tolerated treatment well    Behavior During Therapy [facility] for tasks assessed/performed                Past Medical History:  Diagnosis Date   Anemia    Anxiety    Arthritis    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   Depression    GERD (gastroesophageal reflux disease)    Headache    "sinus headaches"   History of kidney stones    Hypertension    OSA (obstructive sleep apnea)    Osteoarthritis    Pneumonia    Prediabetes    Recurrent sinus infections 08/02/2012   Tubular adenoma of colon    Past Surgical History:  Procedure Laterality Date   ABDOMINAL HYSTERECTOMY  1980's   "endometrosis"   APPENDECTOMY     BREAST SURGERY     CATARACT EXTRACTION, BILATERAL     CHOLECYSTECTOMY  1985   FUNCTIONAL ENDOSCOPIC SINUS SURGERY  1990's   "cause I kept having sinus infections"   immunoglobulin treatment  2017   LAPAROSCOPIC APPENDECTOMY N/A 06/04/2014   Procedure: APPENDECTOMY LAPAROSCOPIC;  Surgeon: Zenovia Jarred, MD;  Location: [place];  Service: General;  Laterality: N/A;   LUMBAR LAMINECTOMY/DECOMPRESSION MICRODISCECTOMY Left 11/01/2017   Procedure: Left Lumbar Four-Five Extraforaminal Microdiscectomy;  Surgeon: Eustace Moore, MD;  Location: [place];  Service: Neurosurgery;  Laterality: Left;   LYMPH NODE BIOPSY     "determined I had CLL"   SHOULDER ARTHROSCOPY Right    TEMPOROMANDIBULAR JOINT ARTHROPLASTY  1980's   TONSILLECTOMY AND ADENOIDECTOMY  1950's   Patient Active Problem List   Diagnosis Date Noted   Fatty liver 02/01/2022   Pneumonia due to COVID-19 virus 09/08/2021   Norovirus 04/10/2020   AKI (acute kidney injury) ([place]) 04/08/2020   Anxiety with depression 04/08/2020   Hyperbilirubinemia 04/08/2020   Hypocalcemia 04/08/2020  Hypomagnesemia 04/08/2020   Chronic bronchitis, unspecified chronic bronchitis type ([place]) 03/12/2020   Atherosclerosis of aorta ([place]) by CT abd on 04/08/2020 03/12/2020   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   S/P lumbar laminectomy 11/01/2017   Thrombocytopenia ([place]) 05/02/2016   Immunocompromised ([place]) 10/10/2015   BMI 37.85,   adult 09/23/2015   Environmental and seasonal allergies 08/15/2015   Mixed hyperlipidemia 06/01/2015   Medication management 06/01/2015   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   Vitamin D deficiency 09/30/2014   HTN (hypertension) 01/08/2014   Abnormal glucose    Depression, major, in remission ([place])    GERD (gastroesophageal reflux disease)    OSA (obstructive sleep apnea)    Angioedema secondary to ACE/ARB 12/25/2013   Class 2 obesity due to excess calories with body mass index (BMI) of 37.0 to 37.9 in adult 12/17/2013   Hepatitis A 08/02/2012   Recurrent sinus infections 08/02/2012   CLL (chronic lymphocytic leukemia) ([place]) 11/22/2011    ONSET DATE: August 2023 for contraction of pneumonia:  Referral date 09-12-22  REFERRING DIAG: I77.824 (ICD-10-CM) - Spinal stenosis, lumbar region, with neurogenic claudication   THERAPY DIAG:  Muscle weakness (generalized)  Other abnormalities of gait and mobility  Unsteadiness on feet  Rationale for Evaluation and Treatment Rehabilitation  Subjective:   Pt had eye doctor appt  this afternoon prior to PT - states "having a little trouble seeing clearly because my eyes are dilated"; reports she has not done exercises this week due to having received back injections last week on the 24th  Pt accompanied by: family member  PERTINENT HISTORY: LUMBAR LAMINECTOMY/DECOMPRESSION MICRODISCECTOMYLeft11/28/2018, CLL (chronic lymphocytic leukemia, HTN, depression  PAIN  Are you having pain? Yes: NPRS scale: 3/10 Pain location: low back and into legs and over Lt SI joint Pain description: soreness, "hurting" Aggravating factors: standing, walking Relieving factors: sitting or lying down; non-weightbearing positions  injections scheduled for next Tuesday, the 24th States pain varies with weight bearing; unable to stand for prolonged time in the kitchen  PRECAUTIONS: None  WEIGHT BEARING RESTRICTIONS No  FALLS: Has patient fallen in last 6 months? 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 LE's with UE support on counter;  attempted unilateral heel raise on each leg but pt reported cramping in each leg so only performed 3 reps on each  Leg press bil. LE's 50# 3 sets 10 reps  Step up exercise - 10 reps each leg with RUE support for balance - onto 6" step  Standing hip exercises with 3# weight on each leg - hip flexion with knee  flexed, hip abduction and hip extension with knee extended 10 reps each direction each leg  NeuroRe-ed: Standing balance exercises at counter with UE support prn - alternating forward, backward, and side kicks 10 reps each leg; marching in place 10 reps each leg; Standing in partial tandem stance 30 secs each position x 1 rep  with SBA without UE support; sidestepping along counter x 1 rep without UE support; SLS on each leg    GAIT: Gait pattern: step through pattern and antalgic Distance walked: clinic distances  Assistive device utilized: None Level of assistance: SBA  Step training: attempted step negotiation without use of handrail to simulate steps at her home outside at sidewalk to enter her townhome; pt used step by step sequence with CGA to min assist to negotiate 4 steps; pt has more difficulty/imbalance with descending steps than with ascending    PATIENT EDUCATION: Education details: Eval results; reviewed previous HEP - [employer] [id number] Person educated: Patient Education method: Explanation, demonstration, handout re-issued Education comprehension: verbalized understanding   HOME EXERCISE PROGRAM: [employer] [id number]    GOALS: Goals reviewed with patient? Yes  SHORT TERM GOALS: Target date: 10/21/2022  Improve TUG score to </= 7.5 secs without device for reduced fall risk. Baseline:  8.94 secs Goal status: INITIAL  2.  Increase gait velocity to >/= 2.6 ft/sec without device for increased gait efficiency. Baseline: 2.23 ft/sec (14.72 secs) Goal status: INITIAL  3.  Pt will improve 5 times sit to stand score to </= 14 secs without UE support to demo increased LE strength.  Baseline: 16.57 secs without UE support Goal status: INITIAL  4.  Pt will amb. 5" nonstop without device with c/o back pain </= 5/10 for increased activity tolerance. Baseline: Goal status: INITIAL  5.  Pt will report ability to stand for at least 5" to perform kitchen/cooking activity  with c/o back pain </= 5/10. Baseline: 2" standing - back pain 8/10 intensity Goal status: INITIAL  6.  Independent in HEP for LE strengthening and balance exercises, including walking program. Baseline:  Goal status: INITIAL   LONG TERM GOALS: Target date: 11/18/2022  Improve Berg score to >/ = 53/56 to decrease fall risk. Baseline:  49/56 Goal status: INITIAL  2.  Increase gait velocity to >/= 2.8 ft/sec without device for increased gait efficiency. Baseline: 2.23 ft/sec (14.72 secs) Goal status: INITIAL  3.  Pt will amb. 8" nonstop without device with c/o back pain </= 5/10 for increased activity tolerance. Baseline: 2" 51 secs 350'  Goal status: INITIAL  4.  Pt will report ability to stand for at least 10" to perform kitchen/cooking activity with c/o back pain </= 5/10. Baseline: 2" standing - back pain 8/10 intensity Goal status: INITIAL  5.  Perform floor to stand transfer with UE support with SBA Baseline: To be assessed Goal status: INITIAL  6.  Independent in HEP including strengthening exercises and balance exercises. Baseline: To be established  CLINICAL IMPRESSION: PT session focused on standing balance with UE support prn and on LE strengthening with use of 3# weight for PRE's.  Pt stated she liked doing leg press exercise as she was able to feel her hamstrings working.  Pt demonstrates decreased high level balance skills with need for UE support with SLS for safety.   Cont with POC.    OBJECTIVE IMPAIRMENTS decreased activity tolerance, decreased balance, decreased endurance, difficulty walking, decreased strength, and pain.   ACTIVITY LIMITATIONS carrying, lifting, bending, standing, squatting, stairs, and locomotion level  PARTICIPATION LIMITATIONS: meal prep, cleaning, laundry, shopping, and community activity  PERSONAL FACTORS Age, Fitness, Past/current experiences, and 1 comorbidity: chronic low back pain  are also affecting patient's functional  outcome.   REHAB POTENTIAL: Good  CLINICAL DECISION MAKING: Evolving/moderate complexity  EVALUATION COMPLEXITY: Moderate  PLAN: PT FREQUENCY: 2x/week  PT DURATION: 8  weeks  PLANNED INTERVENTIONS: Therapeutic exercises, Therapeutic activity, Neuromuscular re-education, Balance training, Gait training, Patient/Family education, Self Care, Stair training, DME instructions, and Aquatic Therapy  PLAN FOR NEXT SESSION:  give balance and standing exercises    Bijou Easler, [place], PT 10/06/2022, 8:45 AM

## 2022-10-06 ENCOUNTER — Ambulatory Visit: Payer: Medicare Other | Attending: Family Medicine | Admitting: Physical Therapy

## 2022-10-06 DIAGNOSIS — M6281 Muscle weakness (generalized): Secondary | ICD-10-CM | POA: Insufficient documentation

## 2022-10-06 DIAGNOSIS — R2689 Other abnormalities of gait and mobility: Secondary | ICD-10-CM | POA: Insufficient documentation

## 2022-10-06 DIAGNOSIS — R2681 Unsteadiness on feet: Secondary | ICD-10-CM | POA: Diagnosis not present

## 2022-10-06 DIAGNOSIS — M5459 Other low back pain: Secondary | ICD-10-CM | POA: Insufficient documentation

## 2022-10-06 NOTE — Therapy (Signed)
OUTPATIENT PHYSICAL THERAPY NEURO TREATMENT NOTE                                                                                                                                                                Patient Name: Rebecca Bond MRN: [id number] DOB:06/17/1946, 76 y.o., female Today's Date: 10/07/2022   PCP: [name], MD REFERRING PROVIDER: Earnie Larsson, MD     PT End of Session - 10/07/22 1022     Visit Number 3    Number of Visits 17    Date for PT Re-Evaluation 11/18/22    Authorization Type BCBS Medicare    Authorization Time Period 09-20-22 - 12-04-22    PT Start Time 1532    PT Stop Time 1616    PT Time Calculation (min) 44 min    Equipment Utilized During Treatment Gait belt    Activity Tolerance Patient tolerated treatment well    Behavior During Therapy [facility] for tasks assessed/performed                 Past Medical History:  Diagnosis Date   Anemia    Anxiety    Arthritis    CLL (chronic lymphocytic leukemia) ([place]) dx'd ~ 2015   Depression    GERD (gastroesophageal reflux disease)    Headache    "sinus headaches"   History of kidney stones    Hypertension    OSA (obstructive sleep apnea)    Osteoarthritis    Pneumonia    Prediabetes    Recurrent sinus infections 08/02/2012   Tubular adenoma of colon    Past Surgical History:  Procedure Laterality Date   ABDOMINAL HYSTERECTOMY  1980's   "endometrosis"   APPENDECTOMY     BREAST SURGERY     CATARACT EXTRACTION, BILATERAL     CHOLECYSTECTOMY  1985   FUNCTIONAL ENDOSCOPIC SINUS SURGERY  1990's   "cause I kept having sinus infections"   immunoglobulin treatment  2017   LAPAROSCOPIC APPENDECTOMY N/A 06/04/2014   Procedure: APPENDECTOMY LAPAROSCOPIC;  Surgeon: Zenovia Jarred, MD;  Location: [place];  Service: General;  Laterality: N/A;   LUMBAR LAMINECTOMY/DECOMPRESSION MICRODISCECTOMY Left 11/01/2017   Procedure: Left Lumbar Four-Five Extraforaminal Microdiscectomy;  Surgeon: Eustace Moore, MD;  Location: [place];  Service: Neurosurgery;  Laterality: Left;   LYMPH NODE BIOPSY     "determined I had CLL"   SHOULDER ARTHROSCOPY Right    TEMPOROMANDIBULAR JOINT ARTHROPLASTY  1980's   TONSILLECTOMY AND ADENOIDECTOMY  1950's   Patient Active Problem List   Diagnosis Date Noted   Fatty liver 02/01/2022   Pneumonia due to COVID-19 virus 09/08/2021   Norovirus 04/10/2020   AKI (acute kidney injury) ([place]) 04/08/2020   Anxiety with depression 04/08/2020   Hyperbilirubinemia 04/08/2020   Hypocalcemia  04/08/2020   Hypomagnesemia 04/08/2020   Chronic bronchitis, unspecified chronic bronchitis type ([place]) 03/12/2020   Atherosclerosis of aorta ([place]) by CT abd on 04/08/2020 03/12/2020   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   S/P lumbar laminectomy 11/01/2017   Thrombocytopenia ([place]) 05/02/2016   Immunocompromised ([place]) 10/10/2015   BMI 37.85,   adult 09/23/2015   Environmental and seasonal allergies 08/15/2015   Mixed hyperlipidemia 06/01/2015   Medication management 06/01/2015   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   Vitamin D deficiency 09/30/2014   HTN (hypertension) 01/08/2014   Abnormal glucose    Depression, major, in remission ([place])    GERD (gastroesophageal reflux disease)    OSA (obstructive sleep apnea)    Angioedema secondary to ACE/ARB 12/25/2013   Class 2 obesity due to excess calories with body mass index (BMI) of 37.0 to 37.9 in adult 12/17/2013   Hepatitis A 08/02/2012   Recurrent sinus infections 08/02/2012   CLL (chronic lymphocytic leukemia) ([place]) 11/22/2011    ONSET DATE: August 2023 for contraction of pneumonia:  Referral date 09-12-22  REFERRING DIAG: W09.811 (ICD-10-CM) - Spinal stenosis, lumbar region, with neurogenic claudication   THERAPY DIAG:  Muscle weakness (generalized)  Other abnormalities of gait and mobility  Unsteadiness on feet  Rationale for Evaluation and Treatment Rehabilitation  Subjective:   Pt had eye doctor appt  this afternoon prior to PT - states "having a little trouble seeing clearly because my eyes are dilated"; reports she has not done exercises this week due to having received back injections last week on the 24th  Pt accompanied by: family member  PERTINENT HISTORY: LUMBAR LAMINECTOMY/DECOMPRESSION MICRODISCECTOMYLeft11/28/2018, CLL (chronic lymphocytic leukemia, HTN, depression  PAIN  Are you having pain? Yes: NPRS scale: 3/10 Pain location: low back and into legs and over Lt SI joint Pain description: soreness, "hurting" Aggravating factors: standing, walking Relieving factors: sitting or lying down; non-weightbearing positions  injections scheduled for next Tuesday, the 24th States pain varies with weight bearing; unable to stand for prolonged time in the kitchen  PRECAUTIONS: None  WEIGHT BEARING RESTRICTIONS No  FALLS: Has patient fallen in last 6 months? 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 LE's with UE support on counter;  attempted unilateral heel raise on each leg but pt reported cramping in each leg so only performed 3 reps on each  Leg press bil. LE's 50# 3 sets 10 reps  Step up exercise - 10 reps each leg with RUE support for balance - onto 6" step  Standing hip exercises with 3# weight on each leg - hip flexion with knee  flexed, hip abduction and hip extension with knee extended 10 reps each direction each leg  NeuroRe-ed: Standing balance exercises at counter with UE support prn - alternating forward, backward, and side kicks 10 reps each leg; marching in place 10 reps each leg; Standing in partial tandem stance 30 secs each position  x 1 rep with SBA without UE support; sidestepping along counter x 1 rep without UE support; SLS on each leg    GAIT: Gait pattern: step through pattern and antalgic Distance walked: clinic distances  Assistive device utilized: None Level of assistance: SBA  Step training: attempted step negotiation without use of handrail to simulate steps at her home outside at sidewalk to enter her townhome; pt used step by step sequence with CGA to min assist to negotiate 4 steps; pt has more difficulty/imbalance with descending steps than with ascending    PATIENT EDUCATION: Education details: Eval results; reviewed previous HEP - [employer] [id number] Person educated: Patient Education method: Explanation, demonstration, handout re-issued Education comprehension: verbalized understanding   HOME EXERCISE PROGRAM: [employer] [id number]    GOALS: Goals reviewed with patient? Yes  SHORT TERM GOALS: Target date: 10/21/2022  Improve TUG score to </= 7.5 secs without device for reduced fall risk. Baseline:  8.94 secs Goal status: INITIAL  2.  Increase gait velocity to >/= 2.6 ft/sec without device for increased gait efficiency. Baseline: 2.23 ft/sec (14.72 secs) Goal status: INITIAL  3.  Pt will improve 5 times sit to stand score to </= 14 secs without UE support to demo increased LE strength.  Baseline: 16.57 secs without UE support Goal status: INITIAL  4.  Pt will amb. 5" nonstop without device with c/o back pain </= 5/10 for increased activity tolerance. Baseline: Goal status: INITIAL  5.  Pt will report ability to stand for at least 5" to perform kitchen/cooking activity  with c/o back pain </= 5/10. Baseline: 2" standing - back pain 8/10 intensity Goal status: INITIAL  6.  Independent in HEP for LE strengthening and balance exercises, including walking program. Baseline:  Goal status: INITIAL   LONG TERM GOALS: Target date: 11/18/2022  Improve Berg score to >/ = 53/56 to decrease fall risk. Baseline:  49/56 Goal status: INITIAL  2.  Increase gait velocity to >/= 2.8 ft/sec without device for increased gait efficiency. Baseline: 2.23 ft/sec (14.72 secs) Goal status: INITIAL  3.  Pt will amb. 8" nonstop without device with c/o back pain </= 5/10 for increased activity tolerance. Baseline: 2" 51 secs 350'  Goal status: INITIAL  4.  Pt will report ability to stand for at least 10" to perform kitchen/cooking activity with c/o back pain </= 5/10. Baseline: 2" standing - back pain 8/10 intensity Goal status: INITIAL  5.  Perform floor to stand transfer with UE support with SBA Baseline: To be assessed Goal status: INITIAL  6.  Independent in HEP including strengthening exercises and balance exercises. Baseline: To be established  CLINICAL IMPRESSION: PT session focused on standing balance with UE support prn and on LE strengthening with use of 3# weight for PRE's.  Pt stated she liked doing leg press exercise as she was able to feel her hamstrings working.  Pt demonstrates decreased high level balance skills with need for UE support with SLS for safety.   Cont with POC.    OBJECTIVE IMPAIRMENTS decreased activity tolerance, decreased balance, decreased endurance, difficulty walking, decreased strength, and pain.   ACTIVITY LIMITATIONS carrying, lifting, bending, standing, squatting, stairs, and locomotion level  PARTICIPATION LIMITATIONS: meal prep, cleaning, laundry, shopping, and community activity  PERSONAL FACTORS Age, Fitness, Past/current experiences, and 1 comorbidity: chronic low back pain  are also affecting patient's functional  outcome.   REHAB POTENTIAL: Good  CLINICAL DECISION MAKING: Evolving/moderate complexity  EVALUATION COMPLEXITY: Moderate  PLAN: PT FREQUENCY: 2x/week  PT DURATION: 8 weeks  PLANNED INTERVENTIONS: Therapeutic exercises, Therapeutic activity, Neuromuscular re-education, Balance training, Gait training, Patient/Family education, Self Care, Stair training, DME instructions, and Aquatic Therapy  PLAN FOR NEXT SESSION:  give balance and standing exercises    Rosea Dory, [place], PT 10/07/2022, 10:24 AM

## 2022-10-06 NOTE — Patient Instructions (Signed)
Standing Marching   Using a chair if necessary, march in place. Repeat 10 times. Do 1 sessions per day.  http://[email or web address].exer.us/344      Hip Backward Kick   Using a chair for balance, keep legs shoulder width apart and toes pointed for- ward. Slowly extend one leg back, keeping knee straight. Do not lean forward. Repeat with other leg. Repeat 10 times. Do 1 sessions per day.    Hip Side Kick   Holding a chair for balance, keep legs shoulder width apart and toes pointed forward. Swing a leg out to side, keeping knee straight. Do not lean. Repeat using other leg. Repeat 10 times. Do 1 sessions per day.     Feet Heel-Toe "Tandem", Varied Arm Positions - Eyes Open- partial heel to toe   With eyes open, right foot directly in front of the other, arms out, look straight ahead at a stationary object. Hold 30 seconds. Repeat 1  times per session. Do 1 sessions per day.  Copyright 1 VHI. All rights reserved.       Standing On One Leg Without Support .  Stand on one leg in neutral spine without support. Hold 10 seconds. Repeat on other leg. Do 2 repetitions, 1 sets.  http://bt.exer.us/36   Copyright  VHI. All rights reserved.    Side-Stepping   Walk to left side with eyes open. Take even steps, leading with same foot. Make sure each foot lifts off the floor. Repeat in opposite direction. Repeat for 1 minutes per session. Do 1 sessions per day.      Braiding   Move to side: 1) cross right leg in front of left, 2) bring back leg out to side, then 3) cross right leg behind left, 4) bring left leg out to side. Continue sequence in same direction. Reverse sequence, moving in opposite direction. Repeat sequence 2 times per session. Do 1 sessions per day.   Copyright  VHI. All rights reserved.

## 2022-10-11 ENCOUNTER — Ambulatory Visit: Payer: Medicare Other | Admitting: Physical Therapy

## 2022-10-11 DIAGNOSIS — R2689 Other abnormalities of gait and mobility: Secondary | ICD-10-CM

## 2022-10-11 DIAGNOSIS — R2681 Unsteadiness on feet: Secondary | ICD-10-CM | POA: Diagnosis not present

## 2022-10-11 DIAGNOSIS — M5459 Other low back pain: Secondary | ICD-10-CM

## 2022-10-11 DIAGNOSIS — M6281 Muscle weakness (generalized): Secondary | ICD-10-CM | POA: Diagnosis not present

## 2022-10-11 NOTE — Therapy (Signed)
OUTPATIENT PHYSICAL THERAPY NEURO TREATMENT NOTE                                                                                                                                                                Patient Name: Rebecca Bond MRN: [id number] DOB:1946-06-28, 76 y.o., female Today's Date: 10/12/2022   PCP: [name], MD REFERRING PROVIDER: Earnie Larsson, MD     PT End of Session - 10/12/22 1652     Visit Number 4    Number of Visits 17    Date for PT Re-Evaluation 11/18/22    Authorization Type BCBS Medicare    Authorization Time Period 09-20-22 - 12-04-22    PT Start Time 1534    PT Stop Time 1618    PT Time Calculation (min) 44 min    Activity Tolerance Patient limited by pain    Behavior During Therapy [facility] for tasks assessed/performed                  Past Medical History:  Diagnosis Date   Anemia    Anxiety    Arthritis    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 ~ 2015   Depression    GERD (gastroesophageal reflux disease)    Headache    "sinus headaches"   History of kidney stones    Hypertension    OSA (obstructive sleep apnea)    Osteoarthritis    Pneumonia    Prediabetes    Recurrent sinus infections 08/02/2012   Tubular adenoma of colon    Past Surgical History:  Procedure Laterality Date   ABDOMINAL HYSTERECTOMY  1980's   "endometrosis"   APPENDECTOMY     BREAST SURGERY     CATARACT EXTRACTION, BILATERAL     CHOLECYSTECTOMY  1985   FUNCTIONAL ENDOSCOPIC SINUS SURGERY  1990's   "cause I kept having sinus infections"   immunoglobulin treatment  2017   LAPAROSCOPIC APPENDECTOMY N/A 06/04/2014   Procedure: APPENDECTOMY LAPAROSCOPIC;  Surgeon: Zenovia Jarred, MD;  Location: [place];  Service: General;  Laterality: N/A;   LUMBAR LAMINECTOMY/DECOMPRESSION MICRODISCECTOMY Left 11/01/2017   Procedure: Left Lumbar Four-Five Extraforaminal Microdiscectomy;  Surgeon: Eustace Moore, MD;  Location: [place];  Service: Neurosurgery;   Laterality: Left;   LYMPH NODE BIOPSY     "determined I had CLL"   SHOULDER ARTHROSCOPY Right    TEMPOROMANDIBULAR JOINT ARTHROPLASTY  1980's   TONSILLECTOMY AND ADENOIDECTOMY  1950's   Patient Active Problem List   Diagnosis Date Noted   Fatty liver 02/01/2022   Pneumonia due to COVID-19 virus 09/08/2021   Norovirus 04/10/2020   AKI (acute kidney injury) ([place]) 04/08/2020   Anxiety with depression 04/08/2020   Hyperbilirubinemia 04/08/2020   Hypocalcemia 04/08/2020   Hypomagnesemia 04/08/2020   Chronic  bronchitis, unspecified chronic bronchitis type ([place]) 03/12/2020   Atherosclerosis of aorta ([place]) by CT abd on 04/08/2020 03/12/2020   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   S/P lumbar laminectomy 11/01/2017   Thrombocytopenia ([place]) 05/02/2016   Immunocompromised ([place]) 10/10/2015   BMI 37.85,   adult 09/23/2015   Environmental and seasonal allergies 08/15/2015   Mixed hyperlipidemia 06/01/2015   Medication management 06/01/2015   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   Vitamin D deficiency 09/30/2014   HTN (hypertension) 01/08/2014   Abnormal glucose    Depression, major, in remission ([place])    GERD (gastroesophageal reflux disease)    OSA (obstructive sleep apnea)    Angioedema secondary to ACE/ARB 12/25/2013   Class 2 obesity due to excess calories with body mass index (BMI) of 37.0 to 37.9 in adult 12/17/2013   Hepatitis A 08/02/2012   Recurrent sinus infections 08/02/2012   CLL (chronic lymphocytic leukemia) ([place]) 11/22/2011    ONSET DATE: August 2023 for contraction of pneumonia:  Referral date 09-12-22  REFERRING DIAG: T73.220 (ICD-10-CM) - Spinal stenosis, lumbar region, with neurogenic claudication   THERAPY DIAG:  Muscle weakness (generalized)  Other abnormalities of gait and mobility  Other low back pain  Rationale for Evaluation and Treatment Rehabilitation  Subjective:   Pt reports she pulled some muscles in her back last Friday - went to the store  and did a lot of bending down to reach items on bottom shelf  States pain is radiating down her left lateral thigh - to just approx. 6" above Lt knee  Pt accompanied by: family member  PERTINENT HISTORY: LUMBAR LAMINECTOMY/DECOMPRESSION MICRODISCECTOMYLeft11/28/2018, CLL (chronic lymphocytic leukemia, HTN, depression  PAIN  Are you having pain? Yes: NPRS scale: 8.5/10 Pain location: low back and into legs and over Lt SI joint Pain description: soreness, "hurting" Aggravating factors: standing, walking Relieving factors: sitting or lying down; non-weightbearing positions  injections scheduled for next Tuesday, the 24th States pain varies with weight bearing; unable to stand for prolonged time in the kitchen  PRECAUTIONS: None  WEIGHT BEARING RESTRICTIONS No  FALLS: Has patient fallen in last 6 months? No  LIVING ENVIRONMENT: Lives with: lives with their spouse Lives in: House/apartment Stairs: Yes: Internal: 12 steps; on right going up and External: 2 steps; none Has following equipment at home: None  PLOF: Independent with basic ADLs, Independent with household mobility without device, Independent with community mobility without device, Independent with gait, Independent with transfers, and Needs assistance with homemaking  PATIENT GOALS I want to be able to walk; would like to bend over to load/unload dishwasher be able to play with great-grandson more   OBJECTIVE:  10-11-22:  TherEx:   Pt performed gentle low back stretches & exercises in supine position: Pelvic tilt 10 reps with 5 sec hold Trunk rotation to Rt and Lt sides 15 sec hold x 2 reps each Gentle single knee to chest with other leg flexed due to c/o pain with knee extended Pelvic tilt with hip flexion 10 reps each leg in hooklying position  Sidelying position - clam shell exercise RLE ; pt unable to tolerate lying on Rt side due to c/o discomfort  Heel raises 10 reps bil. LE's with UE support on counter  Leg  press bil. LE's 45# 3 sets 10 reps bil. LE's   GAIT: Gait pattern: step through pattern and antalgic Distance walked: clinic distances  Assistive device utilized: None Level of assistance: SBA  PATIENT EDUCATION: Education details: Eval results; reviewed previous HEP - Medbridge [id number];  added trunk rotation and pelvic tilt to HEP  Person educated: Patient Education method: Explanation, demonstration, handout re-issued Education comprehension: verbalized understanding   HOME EXERCISE PROGRAM: Medbridge [id number]    GOALS: Goals reviewed with patient? Yes  SHORT TERM GOALS: Target date: 10/21/2022  Improve TUG score to </= 7.5 secs without device for reduced fall risk. Baseline:  8.94 secs Goal status: INITIAL  2.  Increase gait velocity to >/= 2.6 ft/sec without device for increased gait efficiency. Baseline: 2.23 ft/sec (14.72 secs) Goal status: INITIAL  3.  Pt will improve 5 times sit to stand score to </= 14 secs without UE support to demo increased LE strength.  Baseline: 16.57 secs without UE support Goal status: INITIAL  4.  Pt will amb. 5" nonstop without device with c/o back pain </= 5/10 for increased activity tolerance. Baseline: Goal status: INITIAL  5.  Pt will report ability to stand for at least 5" to perform kitchen/cooking activity with c/o back pain </= 5/10. Baseline: 2" standing - back pain 8/10 intensity Goal status: INITIAL  6.  Independent in HEP for LE strengthening and balance exercises, including walking program. Baseline:  Goal status: INITIAL   LONG TERM GOALS: Target date: 11/18/2022  Improve Berg score to >/ = 53/56 to decrease fall risk. Baseline:  49/56 Goal status: INITIAL  2.  Increase gait velocity to >/= 2.8 ft/sec without device for increased gait efficiency. Baseline: 2.23 ft/sec (14.72 secs) Goal status: INITIAL  3.  Pt will amb. 8" nonstop without device with c/o back pain </= 5/10 for increased activity  tolerance. Baseline: 2" 51 secs 350'  Goal status: INITIAL  4.  Pt will report ability to stand for at least 10" to perform kitchen/cooking activity with c/o back pain </= 5/10. Baseline: 2" standing - back pain 8/10 intensity Goal status: INITIAL  5.  Perform floor to stand transfer with UE support with SBA Baseline: To be assessed Goal status: INITIAL  6.  Independent in HEP including strengthening exercises and balance exercises. Baseline: To be established  CLINICAL IMPRESSION:  Pt reporting increased Rt sided low back pain due to having strained muscles by bending down to retrieve items on bottom shelf at store.  Passive Rt SLR test was negative for increased pain and for onset of radiating pain with this passive movement.  Increased pain appears to be due to strained low back muscles due to trunk flexion and forward bending and reaching anteriorly.  Pt reported reduced pain at end of session after having performed stretching and closed chain strengthening exercise on leg press.  Pt requests to cancel PT appt on Thursday, 10-13-22 this week for time to allow strained muscles to heal.  Cont with POC.    OBJECTIVE IMPAIRMENTS decreased activity tolerance, decreased balance, decreased endurance, difficulty walking, decreased strength, and pain.   ACTIVITY LIMITATIONS carrying, lifting, bending, standing, squatting, stairs, and locomotion level  PARTICIPATION LIMITATIONS: meal prep, cleaning, laundry, shopping, and community activity  PERSONAL FACTORS Age, Fitness, Past/current experiences, and 1 comorbidity: chronic low back pain  are also affecting patient's functional outcome.   REHAB POTENTIAL: Good  CLINICAL DECISION MAKING: Evolving/moderate complexity  EVALUATION COMPLEXITY: Moderate  PLAN: PT FREQUENCY: 2x/week  PT DURATION: 8 weeks  PLANNED INTERVENTIONS: Therapeutic exercises, Therapeutic activity, Neuromuscular re-education, Balance training, Gait training,  Patient/Family education, Self Care, Stair training, DME instructions, and Aquatic Therapy  PLAN FOR NEXT SESSION:  Check STG's next session: give balance and standing exercises    Cornelio Parkerson, Vaughan Basta  Vinnie Level, PT 10/12/2022, 4:54 PM

## 2022-10-12 ENCOUNTER — Encounter: Payer: Self-pay | Admitting: Physical Therapy

## 2022-10-13 ENCOUNTER — Ambulatory Visit: Payer: Medicare Other | Admitting: Nurse Practitioner

## 2022-10-13 ENCOUNTER — Ambulatory Visit: Payer: Medicare Other | Admitting: Physical Therapy

## 2022-10-17 DIAGNOSIS — M47816 Spondylosis without myelopathy or radiculopathy, lumbar region: Secondary | ICD-10-CM | POA: Diagnosis not present

## 2022-10-17 DIAGNOSIS — M7911 Myalgia of mastication muscle: Secondary | ICD-10-CM | POA: Diagnosis not present

## 2022-10-18 ENCOUNTER — Ambulatory Visit: Payer: Medicare Other | Admitting: Physical Therapy

## 2022-10-19 ENCOUNTER — Other Ambulatory Visit: Payer: Self-pay | Admitting: Nurse Practitioner

## 2022-10-20 ENCOUNTER — Encounter: Payer: Self-pay | Admitting: Physical Therapy

## 2022-10-20 ENCOUNTER — Ambulatory Visit: Payer: Medicare Other | Admitting: Physical Therapy

## 2022-10-20 DIAGNOSIS — M6281 Muscle weakness (generalized): Secondary | ICD-10-CM | POA: Diagnosis not present

## 2022-10-20 DIAGNOSIS — M5459 Other low back pain: Secondary | ICD-10-CM | POA: Diagnosis not present

## 2022-10-20 DIAGNOSIS — R2681 Unsteadiness on feet: Secondary | ICD-10-CM | POA: Diagnosis not present

## 2022-10-20 DIAGNOSIS — R2689 Other abnormalities of gait and mobility: Secondary | ICD-10-CM

## 2022-10-20 NOTE — Therapy (Signed)
OUTPATIENT PHYSICAL THERAPY NEURO TREATMENT NOTE                                                                                                                                                                Patient Name: Rebecca Bond MRN: [id number] DOB:1945-12-15, 76 y.o., female Today's Date: 10/20/2022   PCP: [name], MD REFERRING PROVIDER: Earnie Larsson, MD     PT End of Session - 10/20/22 2038     Visit Number 5    Number of Visits 17    Date for PT Re-Evaluation 11/18/22    Authorization Type BCBS Medicare    Authorization Time Period 09-20-22 - 12-04-22    PT Start Time 1535    PT Stop Time 0630    PT Time Calculation (min) 42 min    Activity Tolerance Patient tolerated treatment well    Behavior During Therapy [facility] for tasks assessed/performed                   Past Medical History:  Diagnosis Date   Anemia    Anxiety    Arthritis    CLL (chronic lymphocytic leukemia) ([place]) dx'd ~ 2015   Depression    GERD (gastroesophageal reflux disease)    Headache    "sinus headaches"   History of kidney stones    Hypertension    OSA (obstructive sleep apnea)    Osteoarthritis    Pneumonia    Prediabetes    Recurrent sinus infections 08/02/2012   Tubular adenoma of colon    Past Surgical History:  Procedure Laterality Date   ABDOMINAL HYSTERECTOMY  1980's   "endometrosis"   APPENDECTOMY     BREAST SURGERY     CATARACT EXTRACTION, BILATERAL     CHOLECYSTECTOMY  1985   FUNCTIONAL ENDOSCOPIC SINUS SURGERY  1990's   "cause I kept having sinus infections"   immunoglobulin treatment  2017   LAPAROSCOPIC APPENDECTOMY N/A 06/04/2014   Procedure: APPENDECTOMY LAPAROSCOPIC;  Surgeon: Zenovia Jarred, MD;  Location: [place];  Service: General;  Laterality: N/A;   LUMBAR LAMINECTOMY/DECOMPRESSION MICRODISCECTOMY Left 11/01/2017   Procedure: Left Lumbar Four-Five Extraforaminal Microdiscectomy;  Surgeon: Eustace Moore, MD;  Location: [place];  Service:  Neurosurgery;  Laterality: Left;   LYMPH NODE BIOPSY     "determined I had CLL"   SHOULDER ARTHROSCOPY Right    TEMPOROMANDIBULAR JOINT ARTHROPLASTY  1980's   TONSILLECTOMY AND ADENOIDECTOMY  1950's   Patient Active Problem List   Diagnosis Date Noted   Fatty liver 02/01/2022   Pneumonia due to COVID-19 virus 09/08/2021   Norovirus 04/10/2020   AKI (acute kidney injury) ([place]) 04/08/2020   Anxiety with depression 04/08/2020   Hyperbilirubinemia 04/08/2020   Hypocalcemia 04/08/2020   Hypomagnesemia 04/08/2020  Chronic bronchitis, unspecified chronic bronchitis type ([place]) 03/12/2020   Atherosclerosis of aorta ([place]) by CT abd on 04/08/2020 03/12/2020   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   S/P lumbar laminectomy 11/01/2017   Thrombocytopenia ([place]) 05/02/2016   Immunocompromised ([place]) 10/10/2015   BMI 37.85,   adult 09/23/2015   Environmental and seasonal allergies 08/15/2015   Mixed hyperlipidemia 06/01/2015   Medication management 06/01/2015   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   Vitamin D deficiency 09/30/2014   HTN (hypertension) 01/08/2014   Abnormal glucose    Depression, major, in remission ([place])    GERD (gastroesophageal reflux disease)    OSA (obstructive sleep apnea)    Angioedema secondary to ACE/ARB 12/25/2013   Class 2 obesity due to excess calories with body mass index (BMI) of 37.0 to 37.9 in adult 12/17/2013   Hepatitis A 08/02/2012   Recurrent sinus infections 08/02/2012   CLL (chronic lymphocytic leukemia) ([place]) 11/22/2011    ONSET DATE: August 2023 for contraction of pneumonia:  Referral date 09-12-22  REFERRING DIAG: Z61.096 (ICD-10-CM) - Spinal stenosis, lumbar region, with neurogenic claudication   THERAPY DIAG:  Muscle weakness (generalized)  Other abnormalities of gait and mobility  Rationale for Evaluation and Treatment Rehabilitation  Subjective:   Pt reports she received cortisone injection last week and it helped   Pt accompanied  by: family member  PERTINENT HISTORY: LUMBAR LAMINECTOMY/DECOMPRESSION MICRODISCECTOMYLeft11/28/2018, CLL (chronic lymphocytic leukemia, HTN, depression  PAIN  Are you having pain? No   PRECAUTIONS: None  WEIGHT BEARING RESTRICTIONS No  FALLS: Has patient fallen in last 6 months? No  LIVING ENVIRONMENT: Lives with: lives with their spouse Lives in: House/apartment Stairs: Yes: Internal: 12 steps; on right going up and External: 2 steps; none Has following equipment at home: None  PLOF: Independent with basic ADLs, Independent with household mobility without device, Independent with community mobility without device, Independent with gait, Independent with transfers, and Needs assistance with homemaking  PATIENT GOALS I want to be able to walk; would like to bend over to load/unload dishwasher be able to play with great-grandson more   OBJECTIVE:  10-20-22:  TherEx:    Leg press bil. LE's 50# 10 reps bil. LE's; 60# 3 sets 10 reps: Step ups onto 6" step 10 reps each leg with UE support on hand rail Step down RLE 10 reps with min HHA with LUE support on counter                   LLE 5 reps only due to difficulty with mod HHA from PT  Standing hip exercises with 3# weight for LLE - hip flexion with knee flexed, abdct. And extension 10 reps each     3# weight for RLE - hip abdct. And extension 10 reps each; hip flexion without weight 10 reps - c/o pulling in back with use of weight  Squats with red medicine ball between knees 3 reps 10 sec hold - chair behind pt for safety  GAIT: Gait pattern: step through pattern and antalgic Distance walked: 3" 29 secs for attempted 5" walk test = pt amb. 395' without device  Assistive device utilized: None Level of assistance: SBA Gait velocity:  8.88 secs = 3.69 ft/sec   TUG score 8.40 secs without device   PATIENT EDUCATION: Education details: Eval results; reviewed previous HEP - Medbridge [id number]; added trunk rotation and pelvic  tilt to HEP  Person educated: Patient Education method: Explanation, demonstration, handout re-issued Education comprehension: verbalized understanding   HOME EXERCISE  PROGRAM: Medbridge [id number]    GOALS: Goals reviewed with patient? Yes  SHORT TERM GOALS: Target date: 10/21/2022  Improve TUG score to </= 7.5 secs without device for reduced fall risk. Baseline:  8.94 secs; 8.40 secs  Goal status: Goal partially met 10-20-22  2.  Increase gait velocity to >/= 2.6 ft/sec without device for increased gait efficiency. Baseline: 2.23 ft/sec (14.72 secs); 8.88 secs = 3.69 ft/sec Goal status: Goal met 10-20-22  3.  Pt will improve 5 times sit to stand score to </= 14 secs without UE support to demo increased LE strength.  Baseline: 16.57 secs without UE support; 13.47 secs without UE support from mat Goal status: Goal met 10-20-22  4.  Pt will amb. 5" nonstop without device with c/o back pain </= 5/10 for increased activity tolerance. Baseline:  10-20-22; pt amb. 3 1/2" nonstop with c/o leg fatigue but no back pain Goal status: Goal partially met 10-20-22   5.  Pt will report ability to stand for at least 5" to perform kitchen/cooking activity with c/o back pain </= 5/10. Baseline: 2" standing - back pain 8/10 intensity; pt reports "it varies"    Goal status: partially met  6.  Independent in HEP for LE strengthening and balance exercises, including walking program. Baseline:  Goal status: Goal met 10-20-22   LONG TERM GOALS: Target date: 11/18/2022  Improve Berg score to >/ = 53/56 to decrease fall risk. Baseline:  49/56 Goal status: INITIAL  2.  Increase gait velocity to >/= 2.8 ft/sec without device for increased gait efficiency. Baseline: 2.23 ft/sec (14.72 secs) Goal status: INITIAL  3.  Pt will amb. 8" nonstop without device with c/o back pain </= 5/10 for increased activity tolerance. Baseline: 2" 51 secs 350'  Goal status: INITIAL  4.  Pt will report ability  to stand for at least 10" to perform kitchen/cooking activity with c/o back pain </= 5/10. Baseline: 2" standing - back pain 8/10 intensity Goal status: INITIAL  5.  Perform floor to stand transfer with UE support with SBA Baseline: To be assessed Goal status: INITIAL  6.  Independent in HEP including strengthening exercises and balance exercises. Baseline: To be established  CLINICAL IMPRESSION:  PT session focused on assessment of STG's:  pt has met STG's #2, 3 and 6:  STG's #1, 4 and 5 partially met as improved from status at initial eval but not to stated goal level.  Pt reported no back pain in today's session, only minor pulling sensation in low back with standing Lt hip flexion with knee flexion with 3# weight - weight was removed and pt able to perform 10 reps without resistance with no c/o pain or pulling sensation.  Pt is progressing well.  Cont with POC.    OBJECTIVE IMPAIRMENTS decreased activity tolerance, decreased balance, decreased endurance, difficulty walking, decreased strength, and pain.   ACTIVITY LIMITATIONS carrying, lifting, bending, standing, squatting, stairs, and locomotion level  PARTICIPATION LIMITATIONS: meal prep, cleaning, laundry, shopping, and community activity  PERSONAL FACTORS Age, Fitness, Past/current experiences, and 1 comorbidity: chronic low back pain  are also affecting patient's functional outcome.   REHAB POTENTIAL: Good  CLINICAL DECISION MAKING: Evolving/moderate complexity  EVALUATION COMPLEXITY: Moderate  PLAN: PT FREQUENCY: 2x/week  PT DURATION: 8 weeks  PLANNED INTERVENTIONS: Therapeutic exercises, Therapeutic activity, Neuromuscular re-education, Balance training, Gait training, Patient/Family education, Self Care, Stair training, DME instructions, and Aquatic Therapy  PLAN FOR NEXT SESSION:  give balance and standing exercises  Alda Lea, PT 10/20/2022, 8:40 PM

## 2022-10-25 ENCOUNTER — Ambulatory Visit: Payer: Medicare Other | Admitting: Physical Therapy

## 2022-11-01 ENCOUNTER — Telehealth: Payer: Self-pay | Admitting: Hematology

## 2022-11-01 ENCOUNTER — Ambulatory Visit: Payer: Medicare Other | Admitting: Physical Therapy

## 2022-11-01 DIAGNOSIS — M5459 Other low back pain: Secondary | ICD-10-CM | POA: Diagnosis not present

## 2022-11-01 DIAGNOSIS — R2681 Unsteadiness on feet: Secondary | ICD-10-CM | POA: Diagnosis not present

## 2022-11-01 DIAGNOSIS — R2689 Other abnormalities of gait and mobility: Secondary | ICD-10-CM | POA: Diagnosis not present

## 2022-11-01 DIAGNOSIS — M6281 Muscle weakness (generalized): Secondary | ICD-10-CM | POA: Diagnosis not present

## 2022-11-01 NOTE — Therapy (Signed)
OUTPATIENT PHYSICAL THERAPY NEURO TREATMENT NOTE                                                                                                                                                                Patient Name: Rebecca Bond MRN: [id number] DOB:[birth year], 76 y.o., female Today's Date: 11/02/2022   PCP: [name], MD REFERRING PROVIDER: Earnie Larsson, MD     PT End of Session - 11/02/22 1345     Visit Number 6    Number of Visits 17    Date for PT Re-Evaluation 11/18/22    Authorization Type BCBS Medicare    Authorization Time Period 09-20-22 - 12-04-22    PT Start Time 1400    PT Stop Time 1445    PT Time Calculation (min) 45 min    Activity Tolerance Patient tolerated treatment well    Behavior During Therapy [facility] for tasks assessed/performed                    Past Medical History:  Diagnosis Date   Anemia    Anxiety    Arthritis    CLL (chronic lymphocytic leukemia) ([place]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 ~ 2015   Depression    GERD (gastroesophageal reflux disease)    Headache    "sinus headaches"   History of kidney stones    Hypertension    OSA (obstructive sleep apnea)    Osteoarthritis    Pneumonia    Prediabetes    Recurrent sinus infections 08/02/2012   Tubular adenoma of colon    Past Surgical History:  Procedure Laterality Date   ABDOMINAL HYSTERECTOMY  1980's   "endometrosis"   APPENDECTOMY     BREAST SURGERY     CATARACT EXTRACTION, BILATERAL     CHOLECYSTECTOMY  1985   FUNCTIONAL ENDOSCOPIC SINUS SURGERY  1990's   "cause I kept having sinus infections"   immunoglobulin treatment  2017   LAPAROSCOPIC APPENDECTOMY N/A 06/04/2014   Procedure: APPENDECTOMY LAPAROSCOPIC;  Surgeon: Zenovia Jarred, MD;  Location: [place];  Service: General;  Laterality: N/A;   LUMBAR LAMINECTOMY/DECOMPRESSION MICRODISCECTOMY Left 11/01/2017   Procedure: Left Lumbar Four-Five Extraforaminal Microdiscectomy;  Surgeon: Eustace Moore, MD;  Location: [place];  Service:  Neurosurgery;  Laterality: Left;   LYMPH NODE BIOPSY     "determined I had CLL"   SHOULDER ARTHROSCOPY Right    TEMPOROMANDIBULAR JOINT ARTHROPLASTY  1980's   TONSILLECTOMY AND ADENOIDECTOMY  1950's   Patient Active Problem List   Diagnosis Date Noted   Fatty liver 02/01/2022   Pneumonia due to COVID-19 virus 09/08/2021   Norovirus 04/10/2020   AKI (acute kidney injury) ([place]) 04/08/2020   Anxiety with depression 04/08/2020   Hyperbilirubinemia 04/08/2020   Hypocalcemia 04/08/2020   Hypomagnesemia 04/08/2020  Chronic bronchitis, unspecified chronic bronchitis type ([place]) 03/12/2020   Atherosclerosis of aorta ([place]) by CT abd on 04/08/2020 03/12/2020   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   S/P lumbar laminectomy 11/01/2017   Thrombocytopenia ([place]) 05/02/2016   Immunocompromised ([place]) 10/10/2015   BMI 37.85,   adult 09/23/2015   Environmental and seasonal allergies 08/15/2015   Mixed hyperlipidemia 06/01/2015   Medication management 06/01/2015   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   Vitamin D deficiency 09/30/2014   HTN (hypertension) 01/08/2014   Abnormal glucose    Depression, major, in remission ([place])    GERD (gastroesophageal reflux disease)    OSA (obstructive sleep apnea)    Angioedema secondary to ACE/ARB 12/25/2013   Class 2 obesity due to excess calories with body mass index (BMI) of 37.0 to 37.9 in adult 12/17/2013   Hepatitis A 08/02/2012   Recurrent sinus infections 08/02/2012   CLL (chronic lymphocytic leukemia) ([place]) 11/22/2011    ONSET DATE: August 2023 for contraction of pneumonia:  Referral date 09-12-22  REFERRING DIAG: I50.277 (ICD-10-CM) - Spinal stenosis, lumbar region, with neurogenic claudication   THERAPY DIAG:  Muscle weakness (generalized)  Other abnormalities of gait and mobility  Rationale for Evaluation and Treatment Rehabilitation  Subjective:   Pt reports left hip started hurting on Saturday - when she woke up this morning she had  to hobble when she got out of bed it hurt so bad - states it feels better now that she has been up and moving around - thinks it is osteoarthritis   Pt accompanied by: self  PERTINENT HISTORY: LUMBAR LAMINECTOMY/DECOMPRESSION MICRODISCECTOMYLeft11/28/2018, CLL (chronic lymphocytic leukemia, HTN, depression  PAIN  Are you having pain? No   PRECAUTIONS: None  WEIGHT BEARING RESTRICTIONS No  FALLS: Has patient fallen in last 6 months? No  LIVING ENVIRONMENT: Lives with: lives with their spouse Lives in: House/apartment Stairs: Yes: Internal: 12 steps; on right going up and External: 2 steps; none Has following equipment at home: None  PLOF: Independent with basic ADLs, Independent with household mobility without device, Independent with community mobility without device, Independent with gait, Independent with transfers, and Needs assistance with homemaking  PATIENT GOALS I want to be able to walk; would like to bend over to load/unload dishwasher be able to play with great-grandson more   OBJECTIVE:  11-01-22:  TherEx:   Heel raises - bil. LE's 10 reps with bil. UE support on counter:  each leg unilaterally 10 reps each with increased UE support on counter due to difficulty  Leg press bil. LE's 50# 5 reps bil. LE's; 60# 3 sets 10 reps: Step ups onto 6" step 10 reps each leg with UE support on hand rail Step down RLE 10 reps with min HHA with LUE support on counter - 4" step used                   LLE 10 reps with mod HHA from PT  Lt ITB stretch in standing - approx. 15 sec hold x 1 rep  Pt performed bil. Hamstring strengthening with red theraband 10 reps each leg - in seated position Hip abduction in seated position with red theraband used for resistance - 10 reps  LAQ's - RLE and LLE 10 reps with 2 sec hold with red theraband  GAIT: Gait pattern: step through pattern and antalgic Distance walked: 3" 29 secs for attempted 5" walk test = pt amb. 395' without device   Assistive device utilized: None Level of assistance: SBA Gait velocity:  8.88 secs = 3.69 ft/sec      PATIENT EDUCATION: Education details: Eval results; reviewed previous HEP - Medbridge [id number]; added trunk rotation and pelvic tilt to HEP  Person educated: Patient Education method: Explanation, demonstration, handout re-issued Education comprehension: verbalized understanding   HOME EXERCISE PROGRAM: [place] 11-01-22; added heel raise - bilateral and unilateral to initial [place]  Access Code: [email or web address] URL: [email or web address] Date: 11/02/2022 Prepared by: Ethelene Browns  Exercises - Seated Knee Extension with Resistance  - 1 x daily - 7 x weekly - 1 sets - 10 reps - Seated Hip Abduction with Resistance  - 1 x daily - 7 x weekly - 1 sets - 10 reps  GOALS: Goals reviewed with patient? Yes  SHORT TERM GOALS: Target date: 10/21/2022  Improve TUG score to </= 7.5 secs without device for reduced fall risk. Baseline:  8.94 secs; 8.40 secs  Goal status: Goal partially met 10-20-22  2.  Increase gait velocity to >/= 2.6 ft/sec without device for increased gait efficiency. Baseline: 2.23 ft/sec (14.72 secs); 8.88 secs = 3.69 ft/sec Goal status: Goal met 10-20-22  3.  Pt will improve 5 times sit to stand score to </= 14 secs without UE support to demo increased LE strength.  Baseline: 16.57 secs without UE support; 13.47 secs without UE support from mat Goal status: Goal met 10-20-22  4.  Pt will amb. 5" nonstop without device with c/o back pain </= 5/10 for increased activity tolerance. Baseline:  10-20-22; pt amb. 3 1/2" nonstop with c/o leg fatigue but no back pain Goal status: Goal partially met 10-20-22   5.  Pt will report ability to stand for at least 5" to perform kitchen/cooking activity with c/o back pain </= 5/10. Baseline: 2" standing - back pain 8/10 intensity; pt reports "it varies"    Goal status: Partially  met 10-20-22  6.  Independent in HEP for LE strengthening and balance exercises, including walking program. Baseline:  Goal status: Goal met 10-20-22   LONG TERM GOALS: Target date: 11/18/2022  Improve Berg score to >/ = 53/56 to decrease fall risk. Baseline:  49/56 Goal status: INITIAL  2.  Increase gait velocity to >/= 2.8 ft/sec without device for increased gait efficiency. Baseline: 2.23 ft/sec (14.72 secs) Goal status: INITIAL  3.  Pt will amb. 8" nonstop without device with c/o back pain </= 5/10 for increased activity tolerance. Baseline: 2" 51 secs 350'  Goal status: INITIAL  4.  Pt will report ability to stand for at least 10" to perform kitchen/cooking activity with c/o back pain </= 5/10. Baseline: 2" standing - back pain 8/10 intensity Goal status: INITIAL  5.  Perform floor to stand transfer with UE support with SBA Baseline: To be assessed Goal status: INITIAL  6.  Independent in HEP including strengthening exercises and balance exercises. Baseline: To be established  CLINICAL IMPRESSION:  Pt reporting new onset of Lt hip pain which she states started 2 days ago and she attributes pain to OA in Lt hip.  PT session focused on LE strengthening with addition of resisted exercises for bil. Quads and hamstrings and hip abductors added to HEP with use of red theraband for resistance.  Pt had tremors/quivering with bil. Knee flexion due to weakness and decreased muscle endurance with attempting to hold contraction for 3 secs.  Pt reported less Lt hip pain as session progressed.  Cont with POC.    OBJECTIVE IMPAIRMENTS decreased activity tolerance, decreased balance, decreased  endurance, difficulty walking, decreased strength, and pain.   ACTIVITY LIMITATIONS carrying, lifting, bending, standing, squatting, stairs, and locomotion level  PARTICIPATION LIMITATIONS: meal prep, cleaning, laundry, shopping, and community activity  PERSONAL FACTORS Age, Fitness, Past/current  experiences, and 1 comorbidity: chronic low back pain  are also affecting patient's functional outcome.   REHAB POTENTIAL: Good  CLINICAL DECISION MAKING: Evolving/moderate complexity  EVALUATION COMPLEXITY: Moderate  PLAN: PT FREQUENCY: 2x/week  PT DURATION: 8 weeks  PLANNED INTERVENTIONS: Therapeutic exercises, Therapeutic activity, Neuromuscular re-education, Balance training, Gait training, Patient/Family education, Self Care, Stair training, DME instructions, and Aquatic Therapy  PLAN FOR NEXT SESSION:  how did theraband exs go? give balance and standing exercises    Alissandra Geoffroy, [place], PT 11/02/2022, 1:46 PM

## 2022-11-01 NOTE — Telephone Encounter (Signed)
Called patient to r/s md visit and lab. Left voicemail with new appt time.

## 2022-11-02 ENCOUNTER — Encounter: Payer: Self-pay | Admitting: Physical Therapy

## 2022-11-09 ENCOUNTER — Encounter: Payer: Self-pay | Admitting: Internal Medicine

## 2022-11-10 ENCOUNTER — Ambulatory Visit: Payer: Medicare Other | Admitting: Physical Therapy

## 2022-11-15 ENCOUNTER — Ambulatory Visit: Payer: Medicare Other | Attending: Family Medicine | Admitting: Physical Therapy

## 2022-11-15 DIAGNOSIS — R2681 Unsteadiness on feet: Secondary | ICD-10-CM | POA: Insufficient documentation

## 2022-11-15 DIAGNOSIS — R2689 Other abnormalities of gait and mobility: Secondary | ICD-10-CM | POA: Insufficient documentation

## 2022-11-15 DIAGNOSIS — I10 Essential (primary) hypertension: Secondary | ICD-10-CM | POA: Insufficient documentation

## 2022-11-15 DIAGNOSIS — M6281 Muscle weakness (generalized): Secondary | ICD-10-CM | POA: Insufficient documentation

## 2022-11-15 DIAGNOSIS — E039 Hypothyroidism, unspecified: Secondary | ICD-10-CM | POA: Insufficient documentation

## 2022-11-15 NOTE — Therapy (Unsigned)
OUTPATIENT PHYSICAL THERAPY NEURO TREATMENT NOTE                                                                                                                                                                Patient Name: Rebecca Bond MRN: [id number] DOB:[birth year], 76 y.o., female Today's Date: 11/16/2022   PCP: [name], MD REFERRING PROVIDER: Earnie Larsson, MD     PT End of Session - 11/16/22 1854     Visit Number 7    Number of Visits 17    Date for PT Re-Evaluation 11/18/22    Authorization Type BCBS Medicare    Authorization Time Period 09-20-22 - 12-04-22    PT Start Time 1535    PT Stop Time 1620    PT Time Calculation (min) 45 min    Activity Tolerance Patient tolerated treatment well    Behavior During Therapy [facility] for tasks assessed/performed                     Past Medical History:  Diagnosis Date   Anemia    Anxiety    Arthritis    CLL (chronic lymphocytic leukemia) ([place]) dx'd ~ 2015   Depression    GERD (gastroesophageal reflux disease)    Headache    "sinus headaches"   History of kidney stones    Hypertension    OSA (obstructive sleep apnea)    Osteoarthritis    Pneumonia    Prediabetes    Recurrent sinus infections 08/02/2012   Tubular adenoma of colon    Past Surgical History:  Procedure Laterality Date   ABDOMINAL HYSTERECTOMY  1980's   "endometrosis"   APPENDECTOMY     BREAST SURGERY     CATARACT EXTRACTION, BILATERAL     CHOLECYSTECTOMY  1985   FUNCTIONAL ENDOSCOPIC SINUS SURGERY  1990's   "cause I kept having sinus infections"   immunoglobulin treatment  2017   LAPAROSCOPIC APPENDECTOMY N/A 06/04/2014   Procedure: APPENDECTOMY LAPAROSCOPIC;  Surgeon: Zenovia Jarred, MD;  [facility];  Service: General;  Laterality: N/A;   LUMBAR LAMINECTOMY/DECOMPRESSION MICRODISCECTOMY Left 11/01/2017   Procedure: Left Lumbar Four-Five Extraforaminal Microdiscectomy;  Surgeon: Eustace Moore, MD;  Location: [place];  Service:  Neurosurgery;  Laterality: Left;   LYMPH NODE BIOPSY     "determined I had CLL"   SHOULDER ARTHROSCOPY Right    TEMPOROMANDIBULAR JOINT ARTHROPLASTY  1980's   TONSILLECTOMY AND ADENOIDECTOMY  1950's   Patient Active Problem List   Diagnosis Date Noted   Hypothyroidism 11/15/2022   Essential hypertension 11/15/2022   Fatty liver 02/01/2022   Pneumonia due to COVID-19 virus 09/08/2021   Norovirus 04/10/2020   AKI (acute kidney injury) ([place]) 04/08/2020   Anxiety with depression 04/08/2020   Hyperbilirubinemia  04/08/2020   Hypocalcemia 04/08/2020   Hypomagnesemia 04/08/2020   Chronic bronchitis, unspecified chronic bronchitis type ([place]) 03/12/2020   Atherosclerosis of aorta ([place]) by CT abd on 04/08/2020 03/12/2020   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   S/P lumbar laminectomy 11/01/2017   Thrombocytopenia ([place]) 05/02/2016   Immunocompromised ([place]) 10/10/2015   BMI 37.85,   adult 09/23/2015   Environmental and seasonal allergies 08/15/2015   Mixed hyperlipidemia 06/01/2015   Medication management 06/01/2015   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   Vitamin D deficiency 09/30/2014   HTN (hypertension) 01/08/2014   Abnormal glucose    Depression, major, in remission ([place])    GERD (gastroesophageal reflux disease)    OSA (obstructive sleep apnea)    Angioedema secondary to ACE/ARB 12/25/2013   Class 2 obesity due to excess calories with body mass index (BMI) of 37.0 to 37.9 in adult 12/17/2013   Hepatitis A 08/02/2012   Recurrent sinus infections 08/02/2012   CLL (chronic lymphocytic leukemia) ([place]) 11/22/2011    ONSET DATE: August 2023 for contraction of pneumonia:  Referral date 09-12-22  REFERRING DIAG: J88.416 (ICD-10-CM) - Spinal stenosis, lumbar region, with neurogenic claudication   THERAPY DIAG:  Muscle weakness (generalized)  Other abnormalities of gait and mobility  Unsteadiness on feet  Rationale for Evaluation and Treatment Rehabilitation  Subjective:    Pt reports she feels weak today due to having been sick last week - had a cough - goes to [place] on Wednesday   Pt accompanied by: self  PERTINENT HISTORY: LUMBAR LAMINECTOMY/DECOMPRESSION MICRODISCECTOMYLeft11/28/2018, CLL (chronic lymphocytic leukemia, HTN, depression  PAIN  Are you having pain? No   PRECAUTIONS: None  WEIGHT BEARING RESTRICTIONS No  FALLS: Has patient fallen in last 6 months? No  LIVING ENVIRONMENT: Lives with: lives with their spouse Lives in: House/apartment Stairs: Yes: Internal: 12 steps; on right going up and External: 2 steps; none Has following equipment at home: None  PLOF: Independent with basic ADLs, Independent with household mobility without device, Independent with community mobility without device, Independent with gait, Independent with transfers, and Needs assistance with homemaking  PATIENT GOALS I want to be able to walk; would like to bend over to load/unload dishwasher be able to play with great-grandson more   OBJECTIVE:  11-15-22:  TherEx:   Heel raises - bil. LE's 10 reps with bil. UE support on counter:  each leg unilaterally 10 reps each with increased UE support on counter due to difficulty  Leg press bil. LE's 50# 10 reps 3 sets; seat at position 13 Step ups onto 6" step 10 reps each leg with UE support on hand rail Step down RLE 10 reps with min HHA with LUE support on // bar- 4" step used                   LLE 10 reps with mod UE support on // bars  Pt performed bil. Hamstring strengthening with red theraband 10 reps each leg - in seated position  LAQ's - RLE and LLE 10 reps with 3 sec hold with green theraband  GAIT: Gait pattern: step through pattern and antalgic Distance walked: clinic distances Assistive device utilized: None Level of assistance: SBA   NeuroRe-ed: Rockerboard anterior/posteriorly 10 reps inside // bars with bil. UE support   Stepping over and back of balance beam 10 reps each leg; stepping  laterally over balance beam 10 reps each leg with 1 UE support on // bar   PATIENT EDUCATION: Education details: Eval results; reviewed  previous HEP - Medbridge [id number]; added trunk rotation and pelvic tilt to HEP  Person educated: Patient Education method: Explanation, demonstration, handout re-issued Education comprehension: verbalized understanding   HOME EXERCISE PROGRAM: Medbridge [id number] 11-01-22; added heel raise - bilateral and unilateral to initial [place]  Access Code: [email or web address] URL: [email or web address] Date: 11/02/2022 Prepared by: Ethelene Browns  Exercises - Seated Knee Extension with Resistance  - 1 x daily - 7 x weekly - 1 sets - 10 reps - Seated Hip Abduction with Resistance  - 1 x daily - 7 x weekly - 1 sets - 10 reps  GOALS: Goals reviewed with patient? Yes  SHORT TERM GOALS: Target date: 10/21/2022  Improve TUG score to </= 7.5 secs without device for reduced fall risk. Baseline:  8.94 secs; 8.40 secs  Goal status: Goal partially met 10-20-22  2.  Increase gait velocity to >/= 2.6 ft/sec without device for increased gait efficiency. Baseline: 2.23 ft/sec (14.72 secs); 8.88 secs = 3.69 ft/sec Goal status: Goal met 10-20-22  3.  Pt will improve 5 times sit to stand score to </= 14 secs without UE support to demo increased LE strength.  Baseline: 16.57 secs without UE support; 13.47 secs without UE support from mat Goal status: Goal met 10-20-22  4.  Pt will amb. 5" nonstop without device with c/o back pain </= 5/10 for increased activity tolerance. Baseline:  10-20-22; pt amb. 3 1/2" nonstop with c/o leg fatigue but no back pain Goal status: Goal partially met 10-20-22   5.  Pt will report ability to stand for at least 5" to perform kitchen/cooking activity with c/o back pain </= 5/10. Baseline: 2" standing - back pain 8/10 intensity; pt reports "it varies"    Goal status: Partially met 10-20-22  6.  Independent  in HEP for LE strengthening and balance exercises, including walking program. Baseline:  Goal status: Goal met 10-20-22   LONG TERM GOALS: Target date: 11/18/2022  Improve Berg score to >/ = 53/56 to decrease fall risk. Baseline:  49/56 Goal status: INITIAL  2.  Increase gait velocity to >/= 2.8 ft/sec without device for increased gait efficiency. Baseline: 2.23 ft/sec (14.72 secs) Goal status: INITIAL  3.  Pt will amb. 8" nonstop without device with c/o back pain </= 5/10 for increased activity tolerance. Baseline: 2" 51 secs 350'  Goal status: INITIAL  4.  Pt will report ability to stand for at least 10" to perform kitchen/cooking activity with c/o back pain </= 5/10. Baseline: 2" standing - back pain 8/10 intensity Goal status: INITIAL  5.  Perform floor to stand transfer with UE support with SBA Baseline: To be assessed Goal status: INITIAL  6.  Independent in HEP including strengthening exercises and balance exercises. Baseline: To be established  CLINICAL IMPRESSION:  PT session focused on LE strengthening with focus on quad and hamstring strengthening; pt has decreased eccentric quad strength bil. LE's with LLE slightly weaker than RLE.  Pt reporting generalized weakness and fatigue in today's session due to illness last week.  Cont with POC.    OBJECTIVE IMPAIRMENTS decreased activity tolerance, decreased balance, decreased endurance, difficulty walking, decreased strength, and pain.   ACTIVITY LIMITATIONS carrying, lifting, bending, standing, squatting, stairs, and locomotion level  PARTICIPATION LIMITATIONS: meal prep, cleaning, laundry, shopping, and community activity  PERSONAL FACTORS Age, Fitness, Past/current experiences, and 1 comorbidity: chronic low back pain  are also affecting patient's functional outcome.   REHAB POTENTIAL: Good  CLINICAL DECISION MAKING: Evolving/moderate  complexity  EVALUATION COMPLEXITY: Moderate  PLAN: PT FREQUENCY:  2x/week  PT DURATION: 8 weeks  PLANNED INTERVENTIONS: Therapeutic exercises, Therapeutic activity, Neuromuscular re-education, Balance training, Gait training, Patient/Family education, Self Care, Stair training, DME instructions, and Aquatic Therapy  PLAN FOR NEXT SESSION:  Check LTG's and renew: cont balance & strengthening exs.    Alda Lea, PT 11/16/2022, 6:59 PM

## 2022-11-15 NOTE — Patient Instructions (Signed)
    Knee Extension: Resisted (Sitting)    With band looped around right ankle and under other foot, straighten leg with ankle loop. Keep other leg bent to increase resistance. Repeat ____ times per set. Do ____ sets per session. Do ____ sessions per day.  http://[email or web address].us/691   Copyright  VHI. All rights reserved.  Knee Flexion: Resisted (Sitting)    Sit with band under left foot and looped around ankle of supported leg. Pull unsupported leg back. Repeat __10__ times per set. Do __1__ sets per session. Do _1-2___ sessions per day.  HOLD for 3-4  secs.  http://orth.exer.us/695   Copyright  VHI. All rights reserved.

## 2022-11-15 NOTE — Progress Notes (Unsigned)
ANNUAL WELLNESS VISIT  Assessment and Plan:   Encounter for Medicare annual wellness exam  Due Annually  Health Maintenance Reviewed  Healthy lifestyle reviewed and goals set   CLL (chronic lymphocytic leukemia) ([place]) Continue follow up Dr. Irene Limbo  Morbid obesity (BMI 37.95) - follow up 3 months for progress monitoring - increase veggies, decrease carbs - long discussion about weight loss, diet, and exercise  Atherosclerosis of aorta (HCC) Control blood pressure, cholesterol, glucose, increase exercise.   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 Irene Limbo  Chronic bronchitis, unspecified chronic bronchitis type ([place]) Monitor, continue to mask, socially distance and hand wash  Thrombocytopenia ([place]) -     COMPLETE METABOLIC PANEL WITH GFR - CBC  Depression, major, in remission (HCC) -   Continue Fluoxetine 20 mg QD Suggest CBT therapy for anxiety/counseling.   Immunocompromised ([place]) Monitor, continue to mask, socially distance and hand wash  Hypogammaglobulinemia (HCC) Monitor, continue to mask, socially distance and hand wash  Essential hypertension -     COMPLETE METABOLIC PANEL WITH GFR -     TSH - continue medications, DASH diet, exercise and monitor at home. Call if greater than 130/80.   OSA (obstructive sleep apnea) States it is mild, would prefer to work on weight loss than do OSA Try to avoid sleeping supine.   Mixed hyperlipidemia -     Lipid panel check lipids decrease fatty foods increase activity.   Medication management -     Magnesium  Abnormal glucose Discussed disease progression and risks Discussed diet/exercise, weight management and risk modification  Vitamin D deficiency -     VITAMIN D 25 Hydroxy (Vit-D Deficiency, Fractures)  Gastroesophageal reflux disease, unspecified whether esophagitis present Continue Prilosec Follow up with GI Magnesium  Hypothyroidism, unspecified type TSH Continue Levothyroxine 50 mcg  QD Please take your thyroid medication greater than 30 min before breakfast, separated by at least 4 hours  from antacids, calcium, iron, and multivitamins.   Stage 3a CKD(HCC) Increase fluids, avoid NSAIDS, monitor sugars, will monitor    Continue diet and meds as discussed. Further disposition pending results of labs. OVER 30 minutes of exam, counseling, chart review, referral performed   Future Appointments  Date Time Provider [place]  11/15/2022  3:30 PM Guido Sander, [place] [facility]  11/16/2022  4:00 PM Alycia Rossetti, NP [facility] None  11/17/2022  3:30 PM Guido Sander, PT [facility]  11/22/2022  3:30 PM Dilday, Vinnie Level, PT [facility] [place]  11/24/2022  3:30 PM Dilday, Vinnie Level, PT [facility]  12/06/2022 12:30 PM [facility] None  12/06/2022  1:00 PM Brunetta Genera, MD [facility] None  03/01/2023  3:00 PM [name], MD [facility] None  08/24/2023  3:00 PM [name], MD [facility] None    Plan:   During the course of the visit the patient was educated and counseled about appropriate screening and preventive services including:   Pneumococcal vaccine  Prevnar 13 Influenza vaccine Td vaccine Screening electrocardiogram Bone densitometry screening Colorectal cancer screening Diabetes screening Glaucoma screening Nutrition counseling  Advanced directives: requested    HPI 76 y.o. female  presents for over due 3 month follow up - follows up with hypertension, hyperlipidemia, prediabetes, Vitamin D, and complicated history of CLL.   She is currently on Wellbutrin and Xanax for anxiety.   She had covid and pneumonia and was hospitalized [facility]08[facility]/9/[facility] course Acute respiratory failure with hypoxia secondary to COVID-19 pneumonia: Has improved at this time.  Patient received IV steroids,  remdesivir during hospitalization.  Was initially on supplemental oxygen which has been weaned off.  Inflammatory  markers improved.  Patient will continue oral dexamethasone for few more days on discharge.   CLL: Patient follows up with with Dr. Irene Limbo oncology as outpatient.  Last visit on August, 2022.  Patient was diagnosed 7 years back with a lymph node biopsy.In the past patient was treated with IVIG to reduce infections.  She will follow-up with oncology at discharge.  She is having no further cough but does have no taste, brain fog, and fatigue.  She is not eating as well due to loss of taste.   BMI is There is no height or weight on file to calculate BMI., she has been working on diet and exercise. Wt Readings from Last 3 Encounters:  08/22/22 201 lb 12.8 oz (91.5 kg)  07/27/22 198 lb 11.2 oz (90.1 kg)  07/21/22 192 lb 9.6 oz (87.4 kg)     Patient was dx'd with CLL in Dec 2009 and been monitored by Dr. Irene Limbo. She could not tolerate the IVIG.  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  She is going to follow up with Dr. Melanee Spry. Lab Results  Component Value Date   WBC 49.8 (H) 08/22/2022   HGB 11.0 (L) 08/22/2022   HCT 32.4 (L) 08/22/2022   MCV 101.6 (H) 08/22/2022   PLT 114 (L) 08/22/2022    She does not workout. She denies chest pain, shortness of breath, dizziness.  She is on cholesterol medication and denies myalgias. Her cholesterol is at goal. The cholesterol last visit was:   Lab Results  Component Value Date   CHOL 200 (H) 08/22/2022   HDL 70 08/22/2022   LDLCALC 109 (H) 08/22/2022   TRIG 106 08/22/2022   CHOLHDL 2.9 08/22/2022    She has been working on diet and exercise for prediabetes, and denies paresthesia of the feet, polydipsia, polyuria and visual disturbances. Last A1C in the office was:  Lab Results  Component Value Date   HGBA1C 5.1 08/22/2022   Patient is on Vitamin D supplement.   Lab Results  Component Value Date   VD25OH 37 08/22/2022     BMI is There is no height or weight on file to calculate BMI., she is working on diet and exercise.. She has slight OSA, NOT on CPAP.   Wt Readings from Last 3 Encounters:  08/22/22 201 lb 12.8 oz (91.5 kg)  07/27/22 198 lb 11.2 oz (90.1 kg)  07/21/22 192 lb 9.6 oz (87.4 kg)   Current Medications:  Current Outpatient Medications on File Prior to Visit  Medication Sig   acetaminophen (TYLENOL) 325 MG tablet Take 650 mg by mouth every 6 (six) hours as needed for mild pain, fever or headache.   ALPRAZolam (XANAX) 1 MG tablet TAKE 1/2-1 TABLET 2-3 TIMES DAILY ONLY IF NEEDED FOR PANIC ATTACK/ANXIETY ATTACK & LIMIT TO 5 DAYS /WEEK TO AVOID ADDICTION & DEMENTIA   bisoprolol-hydrochlorothiazide (ZIAC) 10-6.25 MG tablet Take 1 tablet every Morning for BP & Fluid   buPROPion (WELLBUTRIN XL) 300 MG 24 hr tablet Take  1 tablet  Daily  for Mood / Depression, Focus  & Concentration   ezetimibe (ZETIA) 10 MG tablet Take  1 tablet  Daily for Cholesterol                                            /  TAKE                BY           MOUTH   FLUoxetine (PROZAC) 40 MG capsule Take 1 capsule Daily for Mood   levothyroxine (SYNTHROID) 50 MCG tablet TAKE 1 TABLET(50 MCG) BY MOUTH DAILY   pantoprazole (PROTONIX) 40 MG tablet Take 1 tablet Daily for Indigestion & Acid Reflux   RESTASIS 0.05 % ophthalmic emulsion Place 1 drop into both eyes 2 (two) times daily as needed (for seasonal allergies/irritation).    No current facility-administered medications on file prior to visit.   Medical History:  Past Medical History:  Diagnosis Date   Anemia    Anxiety    Arthritis    CLL (chronic lymphocytic leukemia) ([place]) dx'd ~ 2015   Depression    GERD (gastroesophageal reflux disease)    Headache    "sinus headaches"   History of kidney stones    Hypertension    OSA (obstructive sleep apnea)    Osteoarthritis    Pneumonia    Prediabetes    Recurrent sinus infections 08/02/2012   Tubular adenoma of colon    Allergies:  Allergies  Allergen Reactions   Hyzaar [Losartan Potassium-Hctz] Swelling and Other (See  Comments)    "messed up my sodium counts"and swelled lips   Sudafed [Pseudoephedrine Hcl] Palpitations   Levaquin [Levofloxacin In D5w] Diarrhea and Nausea Only   Acyclovir And Related Other (See Comments)    Reaction not recalled   Biaxin [Clarithromycin]     GI Upset   Cantaloupe Extract Allergy Skin Test Other (See Comments)    Nasal congestion, headaches, and sneezing   Celexa [Citalopram] Other (See Comments)    Reaction not recalled   Flexeril [Cyclobenzaprine]     "zombie-like" feeling   Fosamax [Alendronate Sodium]     GI upset   Gabapentin     confusion   Iohexol Hives and Other (See Comments)    Patient stated she broke out in hive 20 yrs ago from IV contrast      Losartan Swelling and Other (See Comments)    Angioedema   Meloxicam     GI upset   Norvasc [Amlodipine] Swelling and Other (See Comments)    Ankles swell   Other Nausea Only and Other (See Comments)    Iceburg lettuce- "made me dreadfully nauseous" Raw mushrooms only-  "made me dreadfully nauseous" (cooked ones are tolerated)   Pseudoephedrine     Palpitations   Zoloft [Sertraline Hcl] Other (See Comments)    Has no emotions at all     SURGICAL HISTORY She  has a past surgical history that includes Cholecystectomy (1985); Tonsillectomy and adenoidectomy (1950's); Breast surgery; Lymph node biopsy; Temporomandibular joint arthroplasty (1980's); Functional endoscopic sinus surgery (1990's); Abdominal hysterectomy (1980's); laparoscopic appendectomy (N/A, 06/04/2014); immunoglobulin treatment (2017); Appendectomy; Shoulder arthroscopy (Right); Cataract extraction, bilateral; and Lumbar laminectomy/decompression microdiscectomy (Left, 11/01/2017). FAMILY HISTORY Her family history includes Arthritis in her brother; Heart attack in her brother and maternal grandmother; Hypertension in her maternal grandmother and mother; Parkinson's disease in her mother. SOCIAL HISTORY She  reports that she quit smoking about 53  years ago. Her smoking use included cigarettes. She has a 3.00 pack-year smoking history. She has never used smokeless tobacco. She reports current alcohol use of about 7.0 standard drinks of alcohol per week. She reports that she does not use drugs.  Immunization History  Administered Date(s) Administered   Fluad Quad(high Dose 65+)  10/16/2020   Influenza, High Dose Seasonal PF 08/29/2017, 08/22/2018, 09/11/2019   Influenza,inj,Quad PF,6+ Mos 09/15/2014, 08/13/2015   Influenza-Unspecified 08/31/2013, 09/04/2014   PFIZER(Purple Top)SARS-COV-2 Vaccination 01/16/2020, 02/10/2020, 10/16/2020   Pneumococcal Conjugate-13 09/23/2015   Pneumococcal Polysaccharide-23 08/29/2017   Pneumococcal-Unspecified 12/28/2010   Td 07/05/2006, 08/22/2018   Health Maintenance  Topic Date Due   Zoster Vaccines- Shingrix (1 of 2) Never done   COVID-19 Vaccine (4 - Booster for Pfizer series) 12/11/2020   MAMMOGRAM  07/03/2021   INFLUENZA VACCINE  07/05/2021   COLONOSCOPY (Pts 45-42yr Insurance coverage will need to be confirmed)  08/19/2025   TETANUS/TDAP  08/22/2028   Pneumonia Vaccine 76 Years old  Completed   DEXA SCAN  Completed   Hepatitis C Screening  Completed   HPV VACCINES  Aged Out   Mammogram 07/03/20 Colonoscopy 08/19/20 Dr. PHilarie Fredrickson due 2026 Dexa: 08/2018 normal Influenza:10/16/20 Pneumovax: 2018 Prevnar 13: 2016 Td: 2019 Covid: 3/3 [place]  Eye Doctor: GWarden Fillers MD Dentist:   MLevell JulyOBJECTIVES: Physical activity:   Cardiac risk factors:   Depression/mood screen:   [facility]/10/2021  Decreased Interest 0  Down, Depressed, Hopeless 0  PHQ - 2 Score 0  Altered sleeping -  Tired, decreased energy -  Change in appetite -  Feeling bad or failure about yourself  -  Trouble concentrating -  Moving slowly or fidgety/restless -  Suicidal thoughts -  PHQ-9 Score -  Difficult doing work/chores -  Some recent data might be hidden    ADLs:  In  your present state of health, do you have any difficulty performing the following activities: 09/09/2021 02/10/2021  Hearing? N N  Vision? N N  Difficulty concentrating or making decisions? N N  Walking or climbing stairs? N N  Dressing or bathing? N N  Doing errands, shopping? N N  Some recent data might be hidden     Cognitive Testing  Alert? Yes  Normal Appearance?Yes  Oriented to person? Yes  Place? Yes   Time? Yes  Recall of three objects?  Yes  Can perform simple calculations? Yes  Displays appropriate judgment?Yes  Can read the correct time from a watch face?Yes  EOL planning: Does Patient Have a Medical Advance Directive?: No Would patient like information on creating a medical advance directive?: Yes (MAU/Ambulatory/Procedural Areas - Information given)  Review of Systems:  Review of Systems  Constitutional:  Positive for malaise/fatigue. Negative for chills, diaphoresis, fever and weight loss.  HENT:  Negative for congestion, ear discharge, ear pain, hearing loss, nosebleeds, sinus pain, sore throat and tinnitus.        + TMJ  Eyes:  Negative for blurred vision, double vision, photophobia, pain and discharge.  Respiratory:  Negative for cough, hemoptysis, sputum production, wheezing and stridor.   Cardiovascular:  Negative for chest pain, palpitations, orthopnea, claudication, leg swelling and PND.  Gastrointestinal:  Negative for abdominal pain, blood in stool, constipation, diarrhea, heartburn, melena, nausea and vomiting.  Genitourinary:  Negative for dysuria, flank pain, frequency, hematuria and urgency.  Musculoskeletal:  Negative for back pain, falls, joint pain, myalgias and neck pain.  Skin: Negative.   Neurological:  Negative for dizziness, tingling, tremors, sensory change, speech change, focal weakness, seizures, loss of consciousness, weakness and headaches.       Loss of taste and smell with covid  Endo/Heme/Allergies:  Negative for environmental allergies and  polydipsia. Does not bruise/bleed easily.  Psychiatric/Behavioral:  Positive for memory loss (brain fog after covid).  Negative for depression, hallucinations, substance abuse and suicidal ideas. The patient is nervous/anxious and has insomnia.     Family history- Review and unchanged Social history- Review and unchanged Physical Exam: There were no vitals taken for this visit. Wt Readings from Last 3 Encounters:  08/22/22 201 lb 12.8 oz (91.5 kg)  07/27/22 198 lb 11.2 oz (90.1 kg)  07/21/22 192 lb 9.6 oz (87.4 kg)   General Appearance: Well nourished, in no apparent distress, no diaphroesis Eyes: PERRLA, EOMs, conjunctiva no swelling or erythema Sinuses:  Frontal/maxillary tenderness ENT/Mouth: Ext aud canals clear, TMs without erythema, bulging. No erythema, swelling, or exudate on post pharynx.  Tonsils not swollen or erythematous. Hearing normal.  Neck: Supple, thyroid normal.  Respiratory: Respiratory effort normal, BS equal bilaterally without rales, rhonchi, wheezing or stridor.  Cardio: RRR with no MRGs. Brisk peripheral pulses with mild edema.  Abdomen: Soft, + BS, obese, diffusely overly tender to palpation,  without rebound, hernias, masses. Lymphatics: Non tender without lymphadenopathy.  Musculoskeletal: Full ROM, 5/5 strength, Normal gait, Strength is normal and symmetric in arms. Skin:  Warm, dry without rashes, lesions, ecchymosis.  Neuro: Cranial nerves intact. Normal muscle tone, no cerebellar symptoms. Psych: Awake and oriented X 3, normal affect, appears anxious, Insight and Judgment appropriate.   Medicare Attestation I have personally reviewed: The patient's medical and social history Their use of alcohol, tobacco or illicit drugs Their current medications and supplements The patient's functional ability including ADLs,fall risks, home safety risks, cognitive, and hearing and visual impairment Diet and physical activities Evidence for depression or mood  disorders  The patient's weight, height, BMI, and visual acuity have been recorded in the chart.  I have made referrals, counseling, and provided education to the patient based on review of the above and I have provided the patient with a written personalized care plan for preventive services.     Alycia Rossetti, NP 11:56 AM [facility]

## 2022-11-16 ENCOUNTER — Encounter: Payer: Self-pay | Admitting: Nurse Practitioner

## 2022-11-16 ENCOUNTER — Ambulatory Visit (INDEPENDENT_AMBULATORY_CARE_PROVIDER_SITE_OTHER): Payer: Medicare Other | Admitting: Nurse Practitioner

## 2022-11-16 ENCOUNTER — Encounter: Payer: Self-pay | Admitting: Physical Therapy

## 2022-11-16 VITALS — BP 172/84 | HR 73 | Temp 97.5°F | Ht 62.0 in | Wt 196.4 lb

## 2022-11-16 DIAGNOSIS — Z0001 Encounter for general adult medical examination with abnormal findings: Secondary | ICD-10-CM | POA: Diagnosis not present

## 2022-11-16 DIAGNOSIS — E559 Vitamin D deficiency, unspecified: Secondary | ICD-10-CM

## 2022-11-16 DIAGNOSIS — J42 Unspecified chronic bronchitis: Secondary | ICD-10-CM | POA: Diagnosis not present

## 2022-11-16 DIAGNOSIS — Z0000 Encounter for general adult medical examination without abnormal findings: Secondary | ICD-10-CM

## 2022-11-16 DIAGNOSIS — N1831 Chronic kidney disease, stage 3a: Secondary | ICD-10-CM

## 2022-11-16 DIAGNOSIS — C9110 Chronic lymphocytic leukemia of B-cell type not having achieved remission: Secondary | ICD-10-CM

## 2022-11-16 DIAGNOSIS — D801 Nonfamilial hypogammaglobulinemia: Secondary | ICD-10-CM

## 2022-11-16 DIAGNOSIS — R7309 Other abnormal glucose: Secondary | ICD-10-CM

## 2022-11-16 DIAGNOSIS — Z6837 Body mass index (BMI) 37.0-37.9, adult: Secondary | ICD-10-CM

## 2022-11-16 DIAGNOSIS — D849 Immunodeficiency, unspecified: Secondary | ICD-10-CM

## 2022-11-16 DIAGNOSIS — R6889 Other general symptoms and signs: Secondary | ICD-10-CM

## 2022-11-16 DIAGNOSIS — I700 Atherosclerosis of aorta: Secondary | ICD-10-CM

## 2022-11-16 DIAGNOSIS — E782 Mixed hyperlipidemia: Secondary | ICD-10-CM | POA: Diagnosis not present

## 2022-11-16 DIAGNOSIS — E039 Hypothyroidism, unspecified: Secondary | ICD-10-CM | POA: Diagnosis not present

## 2022-11-16 DIAGNOSIS — D696 Thrombocytopenia, unspecified: Secondary | ICD-10-CM | POA: Diagnosis not present

## 2022-11-16 DIAGNOSIS — B370 Candidal stomatitis: Secondary | ICD-10-CM

## 2022-11-16 DIAGNOSIS — F325 Major depressive disorder, single episode, in full remission: Secondary | ICD-10-CM

## 2022-11-16 DIAGNOSIS — K219 Gastro-esophageal reflux disease without esophagitis: Secondary | ICD-10-CM

## 2022-11-16 DIAGNOSIS — G4733 Obstructive sleep apnea (adult) (pediatric): Secondary | ICD-10-CM

## 2022-11-16 DIAGNOSIS — I10 Essential (primary) hypertension: Secondary | ICD-10-CM

## 2022-11-16 DIAGNOSIS — Z79899 Other long term (current) drug therapy: Secondary | ICD-10-CM

## 2022-11-16 MED ORDER — NYSTATIN 100000 UNIT/ML MT SUSP: OROMUCOSAL | 0 refills | Status: DC

## 2022-11-16 NOTE — Patient Instructions (Signed)
Managing Depression, Adult Depression is a mental health condition that affects your thoughts, feelings, and actions. Being diagnosed with depression can bring you relief if you did not know why you have felt or behaved a certain way. It could also leave you feeling overwhelmed. Finding ways to manage your symptoms can help you feel more positive about your future. How to manage lifestyle changes Being depressed is difficult. Depression can increase the level of everyday stress. Stress can make depression symptoms worse. You may believe your symptoms cannot be managed or will never improve. However, there are many things you can try to help manage your symptoms. There is hope. Managing stress  Stress is your body's reaction to life changes and events, both good and bad. Stress can add to your feelings of depression. Learning to manage your stress can help lessen your feelings of depression. Try some of the following approaches to reducing your stress (stress reduction techniques): Listen to music that you enjoy and that inspires you. Try using a meditation app or take a meditation class. Develop a practice that helps you connect with your spiritual self. Walk in nature, pray, or go to a place of worship. Practice deep breathing. To do this, inhale slowly through your nose. Pause at the top of your inhale for a few seconds and then exhale slowly, letting yourself relax. Repeat this three or four times. Practice yoga to help relax and work your muscles. Choose a stress reduction technique that works for you. These techniques take time and practice to develop. Set aside 5-15 minutes a day to do them. Therapists can offer training in these techniques. Do these things to help manage stress: Keep a journal. Know your limits. Set healthy boundaries for yourself and others, such as saying "no" when you think something is too much. Pay attention to how you react to certain situations. You may not be able to  control everything, but you can change your reaction. Add humor to your life by watching funny movies or shows. Make time for activities that you enjoy and that relax you. Spend less time using electronics, especially at night before bed. The light from screens can make your brain think it is time to get up rather than go to bed.  Medicines Medicines, such as antidepressants, are often a part of treatment for depression. Talk with your pharmacist or health care provider about all the medicines, supplements, and herbal products that you take, their possible side effects, and what medicines and other products are safe to take together. Make sure to report any side effects you may have to your health care provider. Relationships Your health care provider may suggest family therapy, couples therapy, or individual therapy as part of your treatment. How to recognize changes Everyone responds differently to treatment for depression. As you recover from depression, you may start to: Have more interest in doing activities. Feel more hopeful. Have more energy. Eat a more regular amount of food. Have better mental focus. It is important to recognize if your depression is not getting better or is getting worse. The symptoms you had in the beginning may return, such as: Feeling tired. Eating too much or too little. Sleeping too much or too little. Feeling restless, agitated, or hopeless. Trouble focusing or making decisions. Having unexplained aches and pains. Feeling irritable, angry, or aggressive. If you or your family members notice these symptoms coming back, let your health care provider know right away. Follow these instructions at home: Activity Try to   get some form of exercise each day, such as walking. Try yoga, mindfulness, or other stress reduction techniques. Participate in group activities if you are able. Lifestyle Get enough sleep. Cut down on or stop using caffeine, tobacco,  alcohol, and any other harmful substances. Eat a healthy diet that includes plenty of vegetables, fruits, whole grains, low-fat dairy products, and lean protein. Limit foods that are high in solid fats, added sugar, or salt (sodium). General instructions Take over-the-counter and prescription medicines only as told by your health care provider. Keep all follow-up visits. It is important for your health care provider to check on your mood, behavior, and medicines. Your health care provider may need to make changes to your treatment. Where to find support Talking to others  Friends and family members can be sources of support and guidance. Talk to trusted friends or family members about your condition. Explain your symptoms and let them know that you are working with a health care provider to treat your depression. Tell friends and family how they can help. Finances Find mental health providers that fit with your financial situation. Talk with your health care provider if you are worried about access to food, housing, or medicine. Call your insurance company to learn about your co-pays and prescription plan. Where to find more information You can find support in your area from: Anxiety and Depression Association of America (ADAA): adaa.org Mental Health America: mentalhealthamerica.net National Alliance on Mental Illness: nami.org Contact a health care provider if: You stop taking your antidepressant medicines, and you have any of these symptoms: Nausea. Headache. Light-headedness. Chills and body aches. Not being able to sleep (insomnia). You or your friends and family think your depression is getting worse. Get help right away if: You have thoughts of hurting yourself or others. Get help right away if you feel like you may hurt yourself or others, or have thoughts about taking your own life. Go to your nearest emergency room or: Call 911. Call the National Suicide Prevention Lifeline at  1-800-273-8255 or 988. This is open 24 hours a day. Text the Crisis Text Line at 741741. This information is not intended to replace advice given to you by your health care provider. Make sure you discuss any questions you have with your health care provider. Document Revised: 03/29/2022 Document Reviewed: 03/29/2022 Elsevier Patient Education  2023 Elsevier Inc.  

## 2022-11-17 ENCOUNTER — Ambulatory Visit: Payer: Medicare Other | Admitting: Physical Therapy

## 2022-11-17 LAB — CBC WITH DIFFERENTIAL/PLATELET
Absolute Monocytes: 686 cells/uL (ref 200–950)
Basophils Absolute: 0 cells/uL (ref 0–200)
Basophils Relative: 0 %
Eosinophils Absolute: 422 cells/uL (ref 15–500)
Eosinophils Relative: 0.8 %
HCT: 33.3 % — ABNORMAL LOW (ref 35.0–45.0)
Hemoglobin: 11.4 g/dL — ABNORMAL LOW (ref 11.7–15.5)
Lymphs Abs: 45355 cells/uL — ABNORMAL HIGH (ref 850–3900)
MCH: 33.6 pg — ABNORMAL HIGH (ref 27.0–33.0)
MCHC: 34.2 g/dL (ref 32.0–36.0)
MCV: 98.2 fL (ref 80.0–100.0)
MPV: 8.5 fL (ref 7.5–12.5)
Monocytes Relative: 1.3 %
Neutro Abs: 6336 cells/uL (ref 1500–7800)
Neutrophils Relative %: 12 %
Platelets: 129 10*3/uL — ABNORMAL LOW (ref 140–400)
RBC: 3.39 10*6/uL — ABNORMAL LOW (ref 3.80–5.10)
RDW: 12.9 % (ref 11.0–15.0)
Total Lymphocyte: 85.9 %
WBC: 52.8 10*3/uL — ABNORMAL HIGH (ref 3.8–10.8)

## 2022-11-17 LAB — COMPLETE METABOLIC PANEL WITH GFR
AG Ratio: 2.2 (calc) (ref 1.0–2.5)
ALT: 98 U/L — ABNORMAL HIGH (ref 6–29)
AST: 45 U/L — ABNORMAL HIGH (ref 10–35)
Albumin: 4.4 g/dL (ref 3.6–5.1)
Alkaline phosphatase (APISO): 121 U/L (ref 37–153)
BUN: 18 mg/dL (ref 7–25)
CO2: 24 mmol/L (ref 20–32)
Calcium: 9.9 mg/dL (ref 8.6–10.4)
Chloride: 96 mmol/L — ABNORMAL LOW (ref 98–110)
Creat: 0.96 mg/dL (ref 0.60–1.00)
Globulin: 2 g/dL (calc) (ref 1.9–3.7)
Glucose, Bld: 92 mg/dL (ref 65–99)
Potassium: 4.1 mmol/L (ref 3.5–5.3)
Sodium: 132 mmol/L — ABNORMAL LOW (ref 135–146)
Total Bilirubin: 1 mg/dL (ref 0.2–1.2)
Total Protein: 6.4 g/dL (ref 6.1–8.1)
eGFR: 61 mL/min/{1.73_m2} (ref >=60)

## 2022-11-17 LAB — LIPID PANEL
Cholesterol: 195 mg/dL (ref <200)
HDL: 79 mg/dL (ref >=50)
LDL Cholesterol (Calc): 98 mg/dL (calc)
Non-HDL Cholesterol (Calc): 116 mg/dL (calc) (ref <130)
Total CHOL/HDL Ratio: 2.5 (calc) (ref <5.0)
Triglycerides: 85 mg/dL (ref <150)

## 2022-11-17 LAB — TSH: TSH: 2.82 mIU/L (ref 0.40–4.50)

## 2022-11-22 ENCOUNTER — Ambulatory Visit: Payer: Medicare Other | Admitting: Physical Therapy

## 2022-11-23 ENCOUNTER — Ambulatory Visit: Payer: Medicare Other | Admitting: Hematology

## 2022-11-23 ENCOUNTER — Other Ambulatory Visit: Payer: Medicare Other

## 2022-11-24 ENCOUNTER — Ambulatory Visit: Payer: Medicare Other | Admitting: Physical Therapy

## 2022-12-01 ENCOUNTER — Other Ambulatory Visit: Payer: Self-pay

## 2022-12-01 ENCOUNTER — Ambulatory Visit: Payer: Medicare Other | Admitting: Physical Therapy

## 2022-12-01 DIAGNOSIS — C9110 Chronic lymphocytic leukemia of B-cell type not having achieved remission: Secondary | ICD-10-CM

## 2022-12-06 ENCOUNTER — Inpatient Hospital Stay: Payer: Medicare Other

## 2022-12-06 ENCOUNTER — Inpatient Hospital Stay: Payer: Medicare Other | Admitting: Hematology

## 2022-12-07 ENCOUNTER — Telehealth: Payer: Self-pay | Admitting: Nurse Practitioner

## 2022-12-07 NOTE — Telephone Encounter (Signed)
MADE IN ERROR °

## 2022-12-11 NOTE — Progress Notes (Addendum)
Future Appointments  Date Time Provider Department  12/12/2022                       3 mo ov   4:00 PM [name], MD [facility]  12/27/2022  2:00 PM Johney Maine, MD [facility]  03/01/2023                      6 mo ov  3:00 PM [name], MD [facility]  08/24/2023                      cpe  3:00 PM [name], MD [facility]  11/20/2023                    wellness    4:00 PM Raynelle Dick, NP [facility]       History of Present Illness:      This very nice 77 y.o. MWF presents for 3  month follow up with HTN, HLD, Pre-Diabetes, CLL  and Vitamin D Deficiency. She presents for f/u  and spends most of the visit expounding her loneliness for few of being out in the public & getting an infection. Apparently her husband sleeps late and stays gone most of the time and she has no close friends to interact with .  Since lst office visit when her bupropion & Fluoxetine doses were both increased , she has had a couple or few instances of falls or near falls w/o any significant injury & she attributes this to her increased med dosing. Sh also has fears that she may have Parkinson's Disease.  LFT's were slightly increased at last OV ~ 3 weeks ago.        Patient is followed for indolent CLL initially dx'd ~ 2015 by Dr Candise Che.            Patient is treated for HTN (1988) & BP has been controlled at home. Today's BP is not at goal - 160/90. Patient has had no complaints of any cardiac type chest pain, palpitations, dyspnea Pollyann Kennedy /PND, dizziness, claudication or dependent edema.       Hyperlipidemia is controlled with diet & Ezetimibe.  (Patient is Statin Intolerant).  Patient denies myalgias or other med SE's. Last Lipids were at  goal:  Lab Results  Component Value Date   CHOL 195 11/16/2022   HDL 79 11/16/2022   LDLCALC 98 11/16/2022   TRIG 85 11/16/2022   CHOLHDL 2.5 11/16/2022     Also, the patient has Morbid Obesity (BMI 39+)  & consequent PreDiabetes  (A1c_6.0%/2012 ).  She denies symptoms of reactive hypoglycemia, diabetic polys, paresthesias or visual blurring.  Last A1c was normal & at goal:  Lab Results  Component Value Date   HGBA1C 5.1 08/22/2022            Further, the patient also has history of Vitamin D Deficiency ("30" /2008) and supplements vitamin D without any suspected side-effects. Last vitamin D was not at goal  (70-100) :  Lab Results  Component Value Date   VD25OH 37 08/22/2022       Current Outpatient Medications:    acetaminophen 325 MG tablet, Take 650 mg  every 6  hours as needed     ALPRAZolam (XANAX) 1 MG tablet, TAKE 1/2-1 TABLET 2-3 TIMES DAILY ONLY IF NEEDED    VITAMIN C , 1500 mg by oral route., Disp: , Rfl:  bisoprolol-hctz  10-6.25 MG tablet, Take 1 tablet every Morning    buPROPion  XL 300 MG , Take  1 tablet  Daily    Cholecalciferol 2000 u, 10,000 units   ezetimibe  10 MG tablet, Take  1 tablet  Daily    FLUoxetine 40 MG capsule, Take 1 capsule Daily    levothyroxine  50 MCG tablet, TAKE 1 TABLET DAILY   nystatin (MYCOSTATIN) 100000 UNIT/ML susp, 5 ml four times a day   pantoprazole  40 MG tablet, Take 1 tablet Daily for Indigestion & Acid Reflux, Disp: 90 tablet, Rfl: 3   RESTASIS 0.05 % ophth emulsion, Place 1 drop into both eyes 2 times daily as needed     Allergies  Allergen Reactions   Hyzaar [Losartan Potassium-Hctz] Swelling     "messed up my sodium counts"and swelled lips   Sudafed [Pseudoephedrine Hcl] Palpitations   Levaquin [Levofloxacin In D5w] Diarrhea and Nausea Only   Acyclovir And Related    Biaxin [Clarithromycin] GI Upset   Cantaloupe Extract Allergy Skin Test Nasal congestion, headaches, and sneezing   Celexa [Citalopram]    Flexeril [Cyclobenzaprine] "zombie-like" feeling   Fosamax [Alendronate Sodium] GI upset   Iohexol Hives     Patient stated she broke out in hive 20 yrs ago from IV contrast     Losartan Swelling    Angioedema   Meloxicam GI upset   Norvasc  [Amlodipine] Swelling and Other (See Comments)    Ankles swell   Other Nausea Only and Other (See Comments)    Iceburg lettuce- "made me dreadfully nauseous" Raw mushrooms only-  "made me dreadfully nauseous" (cooked ones are tolerated)   Pseudoephedrine     Palpitations   Zoloft [Sertraline Hcl] Other (See Comments)    Has no emotions at all      PMHx:   Past Medical History:  Diagnosis Date   Anemia    Anxiety    Arthritis    CLL (chronic lymphocytic leukemia) (HCC) dx'd ~ 2015   Depression    GERD (gastroesophageal reflux disease)    Headache    "sinus headaches"   History of kidney stones    Hypertension    OSA (obstructive sleep apnea)    Osteoarthritis    Pneumonia    Prediabetes    Recurrent sinus infections 08/02/2012   Tubular adenoma of colon     Immunization History  Administered Date(s) Administered   Fluad Quad(high Dose 65+) 10/16/2020   Influenza, High Dose Seasonal PF 08/29/2017, 08/22/2018, 09/11/2019   Influenza,inj,Quad PF,6+ Mos 09/15/2014, 08/13/2015   Influenza 08/31/2013, 09/04/2014   PFIZERSARS-COV-2 Vacc 01/16/2020, 02/10/2020, 10/16/2020   Pneumococcal Conjugate-13 09/23/2015   Pneumococcal Polysaccharide-23 08/29/2017   Pneumococcal- 23 12/28/2010   Td 07/05/2006, 08/22/2018     Past Surgical History:  Procedure Laterality Date   ABDOMINAL HYSTERECTOMY  1980's   "endometrosis"   APPENDECTOMY     BREAST SURGERY     CATARACT EXTRACTION, BILATERAL     CHOLECYSTECTOMY  1985   FUNCTIONAL ENDOSCOPIC SINUS SURGERY  1990's   "cause I kept having sinus infections"   immunoglobulin treatment  2017   LAPAROSCOPIC APPENDECTOMY N/A 06/04/2014   Procedure: APPENDECTOMY LAPAROSCOPIC;  Surgeon: Liz Malady, MD;  Location: [facility] OR;  Service: General;  Laterality: N/A;   LUMBAR LAMINECTOMY/DECOMPRESSION MICRODISCECTOMY Left 11/01/2017   Procedure: Left Lumbar Four-Five Extraforaminal Microdiscectomy;  Surgeon: Tia Alert, MD;  Location: [facility] OR;   Service: Neurosurgery;  Laterality: Left;   LYMPH NODE  BIOPSY     "determined I had CLL"   SHOULDER ARTHROSCOPY Right    TEMPOROMANDIBULAR JOINT ARTHROPLASTY  1980's   TONSILLECTOMY AND ADENOIDECTOMY  1950's    FHx:    Reviewed / unchanged   SHx:    Reviewed / unchanged    Systems Review:  Constitutional: Denies fever, chills, wt changes, headaches, insomnia, fatigue, night sweats, change in appetite. Eyes: Denies redness, blurred vision, diplopia, discharge, itchy, watery eyes.  ENT: Denies discharge, congestion, post nasal drip, epistaxis, sore throat, earache, hearing loss, dental pain, tinnitus, vertigo, sinus pain, snoring.  CV: Denies chest pain, palpitations, irregular heartbeat, syncope, dyspnea, diaphoresis, orthopnea, PND, claudication or edema. Respiratory: denies cough, dyspnea, DOE, pleurisy, hoarseness, laryngitis, wheezing.  Gastrointestinal: Denies dysphagia, odynophagia, heartburn, reflux, water brash, abdominal pain or cramps, nausea, vomiting, bloating, diarrhea, constipation, hematemesis, melena, hematochezia  or hemorrhoids. Genitourinary: Denies dysuria, frequency, urgency, nocturia, hesitancy, discharge, hematuria or flank pain. Musculoskeletal: Denies arthralgias, myalgias, stiffness, jt. swelling, pain, limping or strain/sprain.  Skin: Denies pruritus, rash, hives, warts, acne, eczema or change in skin lesion(s). Neuro: No weakness, tremor, incoordination, spasms, paresthesia or pain. Psychiatric: Denies confusion, memory loss or sensory loss. Endo: Denies change in weight, skin or hair change.  Heme/Lymph: No excessive bleeding, bruising or enlarged lymph nodes.  Physical Exam  BP (!) 160/90   Pulse 78   Temp 97.9 F (36.6 C)   Resp 17   Ht 5\' 2"  (1.575 m)   Wt 190 lb 12.8 oz (86.5 kg)   SpO2 96%   BMI 34.90 kg/m   Appears  Over nourished, well groomed  and in no distress.  Eyes: PERRLA, EOMs, conjunctiva no swelling or erythema. Sinuses: No  frontal/maxillary tenderness ENT/Mouth: EAC's clear, TM's nl w/o erythema, bulging. Nares clear w/o erythema, swelling, exudates. Oropharynx clear without erythema or exudates. Oral hygiene is good. Tongue normal, non obstructing. Hearing intact.  Neck: Supple. Thyroid not palpable. Car 2+/2+ without bruits, nodes or JVD. Chest: Respirations nl with BS clear & equal w/o rales, rhonchi, wheezing or stridor.  Cor: Heart sounds normal w/ regular rate and rhythm without sig. murmurs, gallops, clicks or rubs. Peripheral pulses normal and equal  without edema.  Abdomen: Soft & bowel sounds normal. Non-tender w/o guarding, rebound, hernias, masses or organomegaly.  Lymphatics: Unremarkable.  Musculoskeletal: Full ROM all peripheral extremities, joint stability, 5/5 strength and normal gait.  Skin: Warm, dry without exposed rashes, lesions or ecchymosis apparent.  Neuro: Cranial nerves intact, reflexes equal bilaterally. Sensory-motor testing grossly intact. Tendon reflexes grossly intact. No Tremor. Muscle tone is Normal w/o cog wheeling.  Pysch: Alert & oriented x 3.  Insight and judgement nl & appropriate. No ideations.  Assessment and Plan:  1. Essential hypertension  - CBC with Differential/Platelet - COMPLETE METABOLIC PANEL WITH GFR  2. Abnormal LFTs  - COMPLETE METABOLIC PANEL WITH GFR - Protime-INR - Gamma GT  3. CLL (chronic lymphocytic leukemia) (HCC)  - CBC with Differential/Platelet  4. Medication management  - decrease Wellbutrin & Prozac doses & ROV in 1 month to recheck.   - CBC with Differential/Platelet - COMPLETE METABOLIC PANEL WITH GFR - Protime-INR - Gamma GT        Discussed  regular exercise, BP monitoring, weight control to achieve/maintain BMI less than 25 and discussed med and SE's. Recommended labs to assess and monitor clinical status with further disposition pending results of labs.  I discussed the assessment and treatment plan with the patient. The  patient was provided  an opportunity to ask questions and all were answered. The patient agreed with the plan and demonstrated an understanding of the instructions.  I provided over 30 minutes of exam, counseling, chart review and  complex critical decision making.       The patient was advised to call back or seek an in-person evaluation if the symptoms worsen or if the condition fails to improve as anticipated.   Marinus Maw, MD

## 2022-12-12 ENCOUNTER — Ambulatory Visit (INDEPENDENT_AMBULATORY_CARE_PROVIDER_SITE_OTHER): Payer: Medicare Other | Admitting: Internal Medicine

## 2022-12-12 ENCOUNTER — Encounter: Payer: Self-pay | Admitting: Internal Medicine

## 2022-12-12 VITALS — BP 160/90 | HR 78 | Temp 97.9°F | Resp 17 | Ht 62.0 in | Wt 190.8 lb

## 2022-12-12 DIAGNOSIS — R7989 Other specified abnormal findings of blood chemistry: Secondary | ICD-10-CM | POA: Diagnosis not present

## 2022-12-12 DIAGNOSIS — C9110 Chronic lymphocytic leukemia of B-cell type not having achieved remission: Secondary | ICD-10-CM | POA: Diagnosis not present

## 2022-12-12 DIAGNOSIS — D696 Thrombocytopenia, unspecified: Secondary | ICD-10-CM | POA: Diagnosis not present

## 2022-12-12 DIAGNOSIS — Z79899 Other long term (current) drug therapy: Secondary | ICD-10-CM | POA: Diagnosis not present

## 2022-12-12 DIAGNOSIS — F419 Anxiety disorder, unspecified: Secondary | ICD-10-CM

## 2022-12-12 DIAGNOSIS — I10 Essential (primary) hypertension: Secondary | ICD-10-CM | POA: Diagnosis not present

## 2022-12-12 DIAGNOSIS — F331 Major depressive disorder, recurrent, moderate: Secondary | ICD-10-CM

## 2022-12-12 DIAGNOSIS — F325 Major depressive disorder, single episode, in full remission: Secondary | ICD-10-CM

## 2022-12-12 MED ORDER — BUPROPION HCL ER (XL) 150 MG PO TB24: ORAL_TABLET | ORAL | 3 refills | Status: DC

## 2022-12-12 MED ORDER — FLUOXETINE HCL 20 MG PO CAPS: ORAL_CAPSULE | ORAL | 3 refills | Status: DC

## 2022-12-12 NOTE — Patient Instructions (Signed)

## 2022-12-13 LAB — COMPLETE METABOLIC PANEL WITH GFR
AG Ratio: 2 (calc) (ref 1.0–2.5)
ALT: 25 U/L (ref 6–29)
AST: 19 U/L (ref 10–35)
Albumin: 3.9 g/dL (ref 3.6–5.1)
Alkaline phosphatase (APISO): 109 U/L (ref 37–153)
BUN: 16 mg/dL (ref 7–25)
CO2: 24 mmol/L (ref 20–32)
Calcium: 9 mg/dL (ref 8.6–10.4)
Chloride: 95 mmol/L — ABNORMAL LOW (ref 98–110)
Creat: 0.8 mg/dL (ref 0.60–1.00)
Globulin: 2 g/dL (calc) (ref 1.9–3.7)
Glucose, Bld: 91 mg/dL (ref 65–99)
Potassium: 3.8 mmol/L (ref 3.5–5.3)
Sodium: 131 mmol/L — ABNORMAL LOW (ref 135–146)
Total Bilirubin: 0.8 mg/dL (ref 0.2–1.2)
Total Protein: 5.9 g/dL — ABNORMAL LOW (ref 6.1–8.1)
eGFR: 76 mL/min/{1.73_m2} (ref >=60)

## 2022-12-13 LAB — CBC WITH DIFFERENTIAL/PLATELET
Absolute Monocytes: 760 cells/uL (ref 200–950)
Basophils Absolute: 0 cells/uL (ref 0–200)
Basophils Relative: 0 %
Eosinophils Absolute: 422 cells/uL (ref 15–500)
Eosinophils Relative: 0.5 %
HCT: 28.9 % — ABNORMAL LOW (ref 35.0–45.0)
Hemoglobin: 9.7 g/dL — ABNORMAL LOW (ref 11.7–15.5)
Lymphs Abs: 75454 cells/uL — ABNORMAL HIGH (ref 850–3900)
MCH: 32.1 pg (ref 27.0–33.0)
MCHC: 33.6 g/dL (ref 32.0–36.0)
MCV: 95.7 fL (ref 80.0–100.0)
MPV: 8.1 fL (ref 7.5–12.5)
Monocytes Relative: 0.9 %
Neutro Abs: 7765 cells/uL (ref 1500–7800)
Neutrophils Relative %: 9.2 %
Platelets: 206 10*3/uL (ref 140–400)
RBC: 3.02 10*6/uL — ABNORMAL LOW (ref 3.80–5.10)
RDW: 13.5 % (ref 11.0–15.0)
Total Lymphocyte: 89.4 %
WBC: 84.4 10*3/uL — ABNORMAL HIGH (ref 3.8–10.8)

## 2022-12-13 LAB — PROTIME-INR
INR: 1
Prothrombin Time: 11 s (ref 9.0–11.5)

## 2022-12-13 LAB — GAMMA GT: GGT: 81 U/L — ABNORMAL HIGH (ref 3–65)

## 2022-12-13 NOTE — Progress Notes (Signed)
<><><><><><><><><><><><><><><><><><><><><><><><><><><><><><><><><> <><><><><><><><><><><><><><><><><><><><><><><><><><><><><><><><><> -   Test results slightly outside the reference range are not unusual. If there is anything important, I will review this with you,  otherwise it is considered normal test values.  If you have further questions,  please do not hesitate to contact me at the office or via My Chart.  <><><><><><><><><><><><><><><><><><><><><><><><><><><><><><><><><> <><><><><><><><><><><><><><><><><><><><><><><><><><><><><><><><><>  -  CBC shows  WBC is higher to 84,000  &  - Hgb / Red blood cells are decreased from 11.4 gm% to now  9.7 gm%   -   Will share result with Dr Irene Limbo & also suggest [place] schedule an appointment                                                                   with Dr Irene Limbo in the next week to recheck  CBC.

## 2022-12-15 ENCOUNTER — Ambulatory Visit: Payer: Medicare Other | Admitting: Physical Therapy

## 2022-12-22 ENCOUNTER — Encounter: Payer: Medicare Other | Admitting: Internal Medicine

## 2022-12-27 ENCOUNTER — Inpatient Hospital Stay: Payer: Medicare Other | Admitting: Hematology

## 2022-12-27 ENCOUNTER — Other Ambulatory Visit: Payer: Self-pay

## 2022-12-27 ENCOUNTER — Inpatient Hospital Stay: Payer: Medicare Other | Attending: Hematology

## 2022-12-27 VITALS — BP 167/69 | HR 60 | Temp 97.5°F | Resp 18 | Wt 197.0 lb

## 2022-12-27 DIAGNOSIS — C9110 Chronic lymphocytic leukemia of B-cell type not having achieved remission: Secondary | ICD-10-CM | POA: Diagnosis not present

## 2022-12-27 DIAGNOSIS — R161 Splenomegaly, not elsewhere classified: Secondary | ICD-10-CM | POA: Insufficient documentation

## 2022-12-27 DIAGNOSIS — Z90710 Acquired absence of both cervix and uterus: Secondary | ICD-10-CM | POA: Insufficient documentation

## 2022-12-27 DIAGNOSIS — D801 Nonfamilial hypogammaglobulinemia: Secondary | ICD-10-CM | POA: Diagnosis not present

## 2022-12-27 DIAGNOSIS — I10 Essential (primary) hypertension: Secondary | ICD-10-CM | POA: Insufficient documentation

## 2022-12-27 DIAGNOSIS — D696 Thrombocytopenia, unspecified: Secondary | ICD-10-CM | POA: Diagnosis not present

## 2022-12-27 DIAGNOSIS — Z87891 Personal history of nicotine dependence: Secondary | ICD-10-CM | POA: Diagnosis not present

## 2022-12-27 DIAGNOSIS — D649 Anemia, unspecified: Secondary | ICD-10-CM | POA: Diagnosis not present

## 2022-12-27 LAB — CBC WITH DIFFERENTIAL (CANCER CENTER ONLY)
Abs Immature Granulocytes: 0.27 10*3/uL — ABNORMAL HIGH (ref 0.00–0.07)
Basophils Absolute: 0 10*3/uL (ref 0.0–0.1)
Basophils Relative: 0 %
Eosinophils Absolute: 0.4 10*3/uL (ref 0.0–0.5)
Eosinophils Relative: 0 %
HCT: 30.5 % — ABNORMAL LOW (ref 36.0–46.0)
Hemoglobin: 10 g/dL — ABNORMAL LOW (ref 12.0–15.0)
Immature Granulocytes: 0 %
Lymphocytes Relative: 91 %
Lymphs Abs: 80 10*3/uL — ABNORMAL HIGH (ref 0.7–4.0)
MCH: 32.2 pg (ref 26.0–34.0)
MCHC: 32.8 g/dL (ref 30.0–36.0)
MCV: 98.1 fL (ref 80.0–100.0)
Monocytes Absolute: 0.6 10*3/uL (ref 0.1–1.0)
Monocytes Relative: 1 %
Neutro Abs: 7 10*3/uL (ref 1.7–7.7)
Neutrophils Relative %: 8 %
Platelet Count: 119 10*3/uL — ABNORMAL LOW (ref 150–400)
RBC: 3.11 MIL/uL — ABNORMAL LOW (ref 3.87–5.11)
RDW: 15.3 % (ref 11.5–15.5)
Smear Review: NORMAL
WBC Count: 88.2 10*3/uL (ref 4.0–10.5)
nRBC: 0 % (ref 0.0–0.2)

## 2022-12-27 LAB — CMP (CANCER CENTER ONLY)
ALT: 18 U/L (ref 0–44)
AST: 21 U/L (ref 15–41)
Albumin: 3.7 g/dL (ref 3.5–5.0)
Alkaline Phosphatase: 104 U/L (ref 38–126)
Anion gap: 9 (ref 5–15)
BUN: 13 mg/dL (ref 8–23)
CO2: 25 mmol/L (ref 22–32)
Calcium: 9.1 mg/dL (ref 8.9–10.3)
Chloride: 98 mmol/L (ref 98–111)
Creatinine: 0.83 mg/dL (ref 0.44–1.00)
GFR, Estimated: >60 mL/min (ref >60)
Glucose, Bld: 100 mg/dL — ABNORMAL HIGH (ref 70–99)
Potassium: 3.8 mmol/L (ref 3.5–5.1)
Sodium: 132 mmol/L — ABNORMAL LOW (ref 135–145)
Total Bilirubin: 0.7 mg/dL (ref 0.3–1.2)
Total Protein: 5.8 g/dL — ABNORMAL LOW (ref 6.5–8.1)

## 2022-12-27 LAB — LACTATE DEHYDROGENASE: LDH: 149 U/L (ref 98–192)

## 2022-12-27 LAB — URIC ACID: Uric Acid, Serum: 7.4 mg/dL — ABNORMAL HIGH (ref 2.5–7.1)

## 2022-12-27 LAB — MAGNESIUM: Magnesium: 1.4 mg/dL — ABNORMAL LOW (ref 1.7–2.4)

## 2022-12-27 NOTE — Progress Notes (Signed)
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 (patient reports -planned for August 2017) 13q deletion does pre-dispose her to recurrent SCC  Current Treatment: observation  Previous treatment: IVIG for several months last winter to reduce recurrent respiratory infections (Patient notes that this helped) Has not required definitive treatment for CLL at this time and has been reluctant to consider treatment when discussed in the setting of thrombocytopenia.  INTERVAL HISTORY:  Rebecca Bond here for continued evaluation and management of [name].  Patient was last seen by me on 07/27/2022 and she she was doing well overall.   Patient reports she has been doing well without any new medical concerns since our last visit. She notes she is going to physical therapy which has been helping her with back pain. Patient also reports of recent fall since our last visit. She notes that her PCP changed the dosage of Wellbutrin and Fluoxetin and she has not fallen since then. She is currently taking Fluoxetine 20 mg and Wellbutrin 150 mg.   She denies fever, chills, night sweats, abdominal pain, skin rash, unexpected weight loss, shortness of breath, abnormal bowel habits, back pain, chest pain, or leg swelling. She complains of occasional mild constipation and mild fatigue.   Patient denies the use of steroids since our last  visit.  She has upcoming appointment with her Neurosurgeon.   She has received the influenza vaccine, but not COVID-19 Booster or RSV vaccine. She does not want to receive the COVID-19 Booster.   REVIEW OF SYSTEMS:   10 Point review of Systems was done is negative except as noted above.   Past Medical History:  Diagnosis Date   Anemia    Anxiety    Arthritis    CLL (chronic lymphocytic leukemia) ([place]) dx'd ~ 2015   Depression    GERD (gastroesophageal reflux disease)    Headache    "sinus headaches"   History of kidney stones    Hypertension    OSA (obstructive sleep apnea)    Osteoarthritis    Pneumonia    Prediabetes    Recurrent sinus infections 08/02/2012   Tubular adenoma of colon     . Past Surgical History:  Procedure Laterality Date   ABDOMINAL HYSTERECTOMY  1980's   "endometrosis"   APPENDECTOMY     BREAST SURGERY     CATARACT EXTRACTION, BILATERAL     CHOLECYSTECTOMY  1985   FUNCTIONAL ENDOSCOPIC SINUS SURGERY  1990's   "cause I kept having sinus infections"   immunoglobulin treatment  2017   LAPAROSCOPIC APPENDECTOMY N/A 06/04/2014   Procedure: APPENDECTOMY LAPAROSCOPIC;  Surgeon: Zenovia Jarred, MD;  Location: [place];  Service: General;  Laterality: N/A;   LUMBAR LAMINECTOMY/DECOMPRESSION MICRODISCECTOMY Left 11/01/2017   Procedure: Left Lumbar Four-Five Extraforaminal Microdiscectomy;  Surgeon: Eustace Moore, MD;  Location: [place];  Service: Neurosurgery;  Laterality: Left;   LYMPH NODE BIOPSY     "determined I had CLL"   SHOULDER  ARTHROSCOPY Right    TEMPOROMANDIBULAR JOINT ARTHROPLASTY  1980's   TONSILLECTOMY AND ADENOIDECTOMY  1950's    . Social History   Tobacco Use   Smoking status: Former    Packs/day: 0.75    Years: 4.00    Total pack years: 3.00    Types: Cigarettes    Quit date: 12/05/1968    Years since quitting: 54.0   Smokeless tobacco: Never  Vaping Use   Vaping Use: Never used  Substance Use Topics   Alcohol use: Yes     Alcohol/week: 7.0 standard drinks of alcohol    Types: 7 Glasses of wine per week   Drug use: No    ALLERGIES:  is allergic to hyzaar [losartan potassium-hctz], sudafed [pseudoephedrine hcl], levaquin [levofloxacin in d5w], acyclovir and related, biaxin [clarithromycin], cantaloupe extract allergy skin test, celexa [citalopram], flexeril [cyclobenzaprine], fosamax [alendronate sodium], gabapentin, iohexol, losartan, meloxicam, norvasc [amlodipine], other, pseudoephedrine, and zoloft [sertraline hcl].  MEDICATIONS:  Current Outpatient Medications  Medication Sig Dispense Refill   acetaminophen (TYLENOL) 325 MG tablet Take 650 mg by mouth every 6 (six) hours as needed for mild pain, fever or headache.     ALPRAZolam (XANAX) 1 MG tablet TAKE 1/2-1 TABLET 2-3 TIMES DAILY ONLY IF NEEDED FOR PANIC ATTACK/ANXIETY ATTACK & LIMIT TO 5 DAYS /WEEK TO AVOID ADDICTION & DEMENTIA 75 tablet 0   Ascorbic Acid (VITAMIN C PO) 1500 mg by oral route.     bisoprolol-hydrochlorothiazide (ZIAC) 10-6.25 MG tablet Take 1 tablet every Morning for BP & Fluid 90 tablet 3   buPROPion (WELLBUTRIN XL) 150 MG 24 hr tablet Take  1 tablet  every Morning for Mood / Depression, Focus  & Concentration 90 tablet 3   Cholecalciferol 50 MCG (2000 UT) TABS 10,000 units     ezetimibe (ZETIA) 10 MG tablet Take  1 tablet  Daily for Cholesterol                                            /                                  TAKE                BY           MOUTH 90 tablet 3   FLUoxetine (PROZAC) 20 MG capsule Take 1 capsule Daily for Mood, Focus & Concentration 90 capsule 3   levothyroxine (SYNTHROID) 50 MCG tablet TAKE 1 TABLET(50 MCG) BY MOUTH DAILY 30 tablet 11   nystatin (MYCOSTATIN) 100000 UNIT/ML suspension 5 ml four times a day, retain in mouth as long as possible (Swish and Spit).  Use for 48 hours after symptoms resolve. 80 mL 0   pantoprazole (PROTONIX) 40 MG tablet Take 1 tablet Daily for Indigestion & Acid Reflux 90 tablet 3    RESTASIS 0.05 % ophthalmic emulsion Place 1 drop into both eyes 2 (two) times daily as needed (for seasonal allergies/irritation).      No current facility-administered medications for this visit.    PHYSICAL EXAMINATION: ECOG PERFORMANCE STATUS: 1  Vitals:   12/27/22 1428  BP: (!) 167/69  Pulse: 60  Resp: 18  Temp: (!) 97.5 F (36.4 C)  SpO2: 98%     Filed Weights   12/27/22 1428  Weight:  197 lb (89.4 kg)    .Body mass index is 36.03 kg/m.  NAD GENERAL:alert, in no acute distress and comfortable SKIN: no acute rashes, no significant lesions EYES: conjunctiva are pink and non-injected, sclera anicteric OROPHARYNX: MMM, no exudates, no oropharyngeal erythema or ulceration NECK: supple, no JVD LYMPH:  no palpable lymphadenopathy in the cervical, axillary or inguinal regions LUNGS: clear to auscultation b/l with normal respiratory effort HEART: regular rate & rhythm ABDOMEN:  normoactive bowel sounds , non tender, not distended. Extremity: no pedal edema PSYCH: alert & oriented x 3 with fluent speech NEURO: no focal motor/sensory deficits    LABORATORY DATA:      Latest Ref Rng & Units 12/27/2022    1:41 PM 12/12/2022    4:17 PM 11/16/2022    5:04 PM  CBC  WBC 4.0 - 10.5 K/uL 88.2  84.4  52.8   Hemoglobin 12.0 - 15.0 g/dL 10.0  9.7  11.4   Hematocrit 36.0 - 46.0 % 30.5  28.9  33.3   Platelets 150 - 400 K/uL 119  206  129    CBC    Component Value Date/Time   WBC 88.2 (HH) 12/27/2022 1341   WBC 84.4 (H) 12/12/2022 1617   RBC 3.11 (L) 12/27/2022 1341   HGB 10.0 (L) 12/27/2022 1341   HGB 11.2 (L) 11/08/2017 1418   HCT 30.5 (L) 12/27/2022 1341   HCT 34.8 11/08/2017 1418   PLT 119 (L) 12/27/2022 1341   PLT 140 (L) 11/08/2017 1418   MCV 98.1 12/27/2022 1341   MCV 96.4 11/08/2017 1418   MCH 32.2 12/27/2022 1341   MCHC 32.8 12/27/2022 1341   RDW 15.3 12/27/2022 1341   RDW 14.8 (H) 11/08/2017 1418   LYMPHSABS 80.0 (H) 12/27/2022 1341   LYMPHSABS 87.0 (H)  11/08/2017 1418   MONOABS 0.6 12/27/2022 1341   MONOABS 1.5 (H) 11/08/2017 1418   EOSABS 0.4 12/27/2022 1341   EOSABS 0.6 (H) 11/08/2017 1418   BASOSABS 0.0 12/27/2022 1341   BASOSABS 0.2 (H) 11/08/2017 1418       Latest Ref Rng & Units 12/27/2022    1:41 PM 12/12/2022    4:17 PM 11/16/2022    5:04 PM  CMP  Glucose 70 - 99 mg/dL 100  91  92   BUN 8 - 23 mg/dL '13  16  18   '$ Creatinine 0.44 - 1.00 mg/dL 0.83  0.80  0.96   Sodium 135 - 145 mmol/L 132  131  132   Potassium 3.5 - 5.1 mmol/L 3.8  3.8  4.1   Chloride 98 - 111 mmol/L 98  95  96   CO2 22 - 32 mmol/L '25  24  24   '$ Calcium 8.9 - 10.3 mg/dL 9.1  9.0  9.9   Total Protein 6.5 - 8.1 g/dL 5.8  5.9  6.4   Total Bilirubin 0.3 - 1.2 mg/dL 0.7  0.8  1.0   Alkaline Phos 38 - 126 U/L 104     AST 15 - 41 U/L 21  19  45   ALT 0 - 44 U/L 18  25  98    . Lab Results  Component Value Date   LDH 149 12/27/2022       RADIOGRAPHIC STUDIES: I have personally reviewed the radiological images as listed and agreed with the findings in the report.  CT ABDOMEN AND PELVIS WITHOUT CONTRAST    IMPRESSION: 1. No acute abnormalities within the abdomen or pelvis. 2. Mild splenomegaly and  several prominent to mildly enlarged pelvic and inguinal lymph nodes. These findings are similar to the prior CT. 3. Small nonobstructing stone in the lower pole the right kidney. No ureteral stones or obstructive uropathy.     Electronically Signed   By: Lajean Manes M.D.   On: 11/14/2016 16:39     ASSESSMENT & PLAN:   77 y.o.  caucasian female with   1. Rai Stage 2 previously - now Stage IV CLL with lymphocytosis, LNadenopathy and mild splenomegaly with anemia and thrombocytopenia. -patient has previously and continues to desire holding off CLL directed treatment as long as possible.   Lab Results  Component Value Date   LDH 133 07/27/2022   2.  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 Has been taking good infection prevention  precautions. CLL initially diagnosed 2009 with 13q deletion. Previously  has been intermittent IVIG, clinically very helpful with decrease in respiratory infections.  Last imaging was CT AP 02-2015. --showed no evidence of Splenomegaly. Negative Hep B serology 2015  No overt new constitutional symptoms except mild unchanged fatigue. Has had significant recurrent headaches with IVIG - discontinued  3. Mild Anemia Hgb hemoglobin stable in the 10.5-11.3 range.  4. Moderate thrombocytopenia PLT count a little better at 119k  5. H/o  Recurrent cutaneous SCC s/p Mohs surgery for SCC of the nosehealed. No issues with infection-   her 13q deletion places her at risk for recurrent SCC. -continue close f/u with dermatologist for evaluation and management of non melanoma skin cancers that can be increased in patient with CLL with 13q deletion.  PLAN: -Discussed lab results from today, 12/27/2022, with the patient. CBC shows WBC of 88.2, hemoglobin of 10.0, hematocrit of 30.5, and platelets of 119. CMP shows sodium of 132. LDH WNL -Recommended RSV vaccine, COVID-19 Booster, and staying up to speed with other age appropriate vaccines.  Patient has no clinical or lab evidence of significant symptomatic CLL progression at this time. Patient on prednisone due to gout flare which might also be elevating the white counts over her baseline. -No clear indication for initiating CLL treatment at this time   FOLLOW-UP: RTC with Dr Irene Limbo with labs in 4 months  The total time spent in the appointment was 20 minutes* .  All of the patient's questions were answered with apparent satisfaction. The patient knows to call the clinic with any problems, questions or concerns.   Sullivan Lone MD MS AAHIVMS [facility] Hematology/Oncology Physician [facility]  .*Total Encounter Time as defined by the Centers for Medicare and Medicaid Services includes, in addition to the face-to-face time of a patient visit  (documented in the note above) non-face-to-face time: obtaining and reviewing outside history, ordering and reviewing medications, tests or procedures, care coordination (communications with other health care professionals or caregivers) and documentation in the medical record.   I, Cleda Mccreedy, am acting as a Education administrator for Sullivan Lone, MD. .I have reviewed the above documentation for accuracy and completeness, and I agree with the above. Brunetta Genera MD

## 2022-12-28 DIAGNOSIS — Z6834 Body mass index (BMI) 34.0-34.9, adult: Secondary | ICD-10-CM | POA: Diagnosis not present

## 2022-12-28 DIAGNOSIS — M48062 Spinal stenosis, lumbar region with neurogenic claudication: Secondary | ICD-10-CM | POA: Diagnosis not present

## 2023-01-05 ENCOUNTER — Ambulatory Visit: Payer: Medicare Other | Attending: Family Medicine | Admitting: Physical Therapy

## 2023-01-05 DIAGNOSIS — R2681 Unsteadiness on feet: Secondary | ICD-10-CM | POA: Insufficient documentation

## 2023-01-05 DIAGNOSIS — M6281 Muscle weakness (generalized): Secondary | ICD-10-CM | POA: Diagnosis not present

## 2023-01-05 DIAGNOSIS — M5459 Other low back pain: Secondary | ICD-10-CM | POA: Insufficient documentation

## 2023-01-05 DIAGNOSIS — R2689 Other abnormalities of gait and mobility: Secondary | ICD-10-CM

## 2023-01-05 NOTE — Therapy (Signed)
OUTPATIENT PHYSICAL THERAPY NEURO TREATMENT NOTE/RE-EVALUATION                                                                                                                                                                Patient Name: Rebecca Bond MRN: [id number] DOB:1946-04-18, 77 y.o., female Today's Date: 01/06/2023   PCP: [name], MD REFERRING PROVIDER: Earnie Larsson, MD     PT End of Session - 01/06/23 1104     Visit Number 1    Number of Visits 9    Date for PT Re-Evaluation 03/10/23    Authorization Type BCBS Medicare    Authorization Time Period 01-05-23 - 04-04-23    PT Start Time 1532    PT Stop Time 2202    PT Time Calculation (min) 49 min    Activity Tolerance Patient tolerated treatment well    Behavior During Therapy [facility] for tasks assessed/performed                      Past Medical History:  Diagnosis Date   Anemia    Anxiety    Arthritis    CLL (chronic lymphocytic leukemia) ([place]) dx'd ~ 2015   Depression    GERD (gastroesophageal reflux disease)    Headache    "sinus headaches"   History of kidney stones    Hypertension    OSA (obstructive sleep apnea)    Osteoarthritis    Pneumonia    Prediabetes    Recurrent sinus infections 08/02/2012   Tubular adenoma of colon    Past Surgical History:  Procedure Laterality Date   ABDOMINAL HYSTERECTOMY  1980's   "endometrosis"   APPENDECTOMY     BREAST SURGERY     CATARACT EXTRACTION, BILATERAL     CHOLECYSTECTOMY  1985   FUNCTIONAL ENDOSCOPIC SINUS SURGERY  1990's   "cause I kept having sinus infections"   immunoglobulin treatment  2017   LAPAROSCOPIC APPENDECTOMY N/A 06/04/2014   Procedure: APPENDECTOMY LAPAROSCOPIC;  Surgeon: Zenovia Jarred, MD;  Location: [facility];  Service: General;  Laterality: N/A;   LUMBAR LAMINECTOMY/DECOMPRESSION MICRODISCECTOMY Left 11/01/2017   Procedure: Left Lumbar Four-Five Extraforaminal Microdiscectomy;  Surgeon: Eustace Moore, MD;  Location: [facility];   Service: Neurosurgery;  Laterality: Left;   LYMPH NODE BIOPSY     "determined I had CLL"   SHOULDER ARTHROSCOPY Right    TEMPOROMANDIBULAR JOINT ARTHROPLASTY  1980's   TONSILLECTOMY AND ADENOIDECTOMY  1950's   Patient Active Problem List   Diagnosis Date Noted   Hypothyroidism 11/15/2022   Essential hypertension 11/15/2022   Fatty liver 02/01/2022   Pneumonia due to COVID-19 virus 09/08/2021   Norovirus 04/10/2020   AKI (acute kidney injury) ([place]) 04/08/2020   Anxiety with depression 04/08/2020  Hyperbilirubinemia 04/08/2020   Hypocalcemia 04/08/2020   Hypomagnesemia 04/08/2020   Chronic bronchitis, unspecified chronic bronchitis type ([place]) 03/12/2020   Atherosclerosis of aorta ([place]) by CT abd on 04/08/2020 03/12/2020   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   S/P lumbar laminectomy 11/01/2017   Thrombocytopenia ([place]) 05/02/2016   Immunocompromised ([place]) 10/10/2015   BMI 37.85,   adult 09/23/2015   Environmental and seasonal allergies 08/15/2015   Mixed hyperlipidemia 06/01/2015   Medication management 06/01/2015   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   Vitamin D deficiency 09/30/2014   HTN (hypertension) 01/08/2014   Abnormal glucose    Depression, major, in remission ([place])    GERD (gastroesophageal reflux disease)    OSA (obstructive sleep apnea)    Angioedema secondary to ACE/ARB 12/25/2013   Class 2 obesity due to excess calories with body mass index (BMI) of 37.0 to 37.9 in adult 12/17/2013   Hepatitis A 08/02/2012   Recurrent sinus infections 08/02/2012   CLL (chronic lymphocytic leukemia) ([place]) 11/22/2011    ONSET DATE: August 2023 for contraction of pneumonia:  Referral date 12-30-22  REFERRING DIAG: W25.852 (ICD-10-CM) - Spinal stenosis, lumbar region, with neurogenic claudication   THERAPY DIAG:  Muscle weakness (generalized) - Plan: PT plan of care cert/re-cert  Other abnormalities of gait and mobility - Plan: PT plan of care  cert/re-cert  Unsteadiness on feet - Plan: PT plan of care cert/re-cert  Other low back pain - Plan: PT plan of care cert/re-cert  Rationale for Evaluation and Treatment Rehabilitation  Subjective:   Pt returns to PT since last session on 11-15-22; had OP PT at this facility from 09-20-22 - 11-15-22 for 7 visits total.  Pt reports she requested to resume PT to work on core strength and LE strength - wants to be able to get up off the floor should she fall.  Pt reports she fell on New Year's Day - due to unsteadiness due to side effects of medication; says MD decreased the dosage of a medication (uncertain of name) on Jan. 2 and she states her balance has been much improved since that time, as it is now out of her system  Pt accompanied by: self  PERTINENT HISTORY: LUMBAR LAMINECTOMY/DECOMPRESSION MICRODISCECTOMYLeft11/28/2018, CLL (chronic lymphocytic leukemia, HTN, depression  PAIN  Are you having pain? No - pt reports no back pain  PRECAUTIONS: None  WEIGHT BEARING RESTRICTIONS No  FALLS: Has patient fallen in last 6 months? 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 LE's 50# 10 reps 5 sets; seat at position 13    Pt performed bil. Hamstring stretching with foot on floor, knee extended - 15 sec hold x 1 rep    GAIT: Gait pattern: step through  pattern  Distance walked: 575' (5 laps) in 3" 45 secs without seated rest period  Assistive device utilized: None Level of assistance: SBA Gait velocity:  11.03 secs, 13.00 secs  2nd trial = 2.97 ft/sec, 2.52 ft/sec     NeuroRe-ed:2.52  TUG score 9.94 secs without use of device  5x sit to stand = 15.40 secs without UE support; pt performed 2 sets    01/06/23 0001  Berg Balance Test  Sit to Stand 4  Standing Unsupported 4  Sitting with Back Unsupported but Feet Supported on Floor or Stool 4  Stand to Sit 4  Transfers 4  Standing Unsupported with Eyes Closed 4  Standing Unsupported with Feet Together 4  From Standing, Reach Forward with Outstretched Arm 4  From Standing Position, Pick up Object from Floor 1  From Standing Position, Turn to Look Behind Over each Shoulder 4  Turn 360 Degrees 3 (3.53 to Rt; 4.6 to Lt)  Standing Unsupported, Alternately Place Feet on Step/Stool 1 (15.25 secs with CGA)  Standing Unsupported, One Foot in Front 3 (tandem 19.97 secs Rt foot in front)  Standing on One Leg 3 (8.59 secs on LLE)  Total Score 47     PATIENT EDUCATION: Education details: Eval results; reviewed previous HEP - Med[employer] [email or web address]; added trunk rotation and pelvic tilt to HEP  Person educated: Patient Education method: Explanation, demonstration, handout re-issued Education comprehension: verbalized understanding   HOME EXERCISE PROGRAM: [place] 11-01-22; added heel raise - bilateral and unilateral to initial [place]  Access Code: [email or web address] URL: [email or web address] Date: 11/02/2022 Prepared by: Ethelene Browns  Exercises - Seated Knee Extension with Resistance  - 1 x daily - 7 x weekly - 1 sets - 10 reps - Seated Hip Abduction with Resistance  - 1 x daily - 7 x weekly - 1 sets - 10 reps  GOALS: Goals reviewed with patient? Yes  SHORT TERM GOALS: Target date: 02-03-23   Pt will improve Berg score to >/= 50/56 to  demo improved balance.  Baseline: 47/56 on 01-05-23  Goal status:  Initial  2.  Increase gait velocity to >/= 3.25 ft/sec without device for increased gait efficiency. Baseline: 2.97 ft/sec (11.03 secs); Goal status: Initial   3.  Pt will improve 5 times sit to stand score to </= 13.5 secs without UE support to demo increased LE strength.  Baseline: 15.40 secs without UE support from chair Goal status:  Initial  4.  Pt will amb. 5" nonstop without device with c/o back pain </= 5/10 for increased activity tolerance. Baseline:  10-20-22; pt amb. 3 1/2" nonstop with c/o leg fatigue but no back pain Goal status: Initial  5.  Pt will report ability to stand for at least 5" to perform kitchen/cooking activity with c/o back pain </= 5/10. Baseline: 3" standing - back pain - pt reports "it varies"    Goal status:  Initial  6.  Independent in HEP for LE strengthening and balance exercises, including walking program. Baseline:  Goal status:  Initial    LONG TERM GOALS: Target date: 03-03-23  Improve Berg score to >/ = 53/56 to decrease fall risk. Baseline:  47/56 on 01-05-23 Goal status: INITIAL  2.  Increase gait velocity to >/= 3.5 ft/sec without device for increased gait efficiency. Baseline: 2.97 ft/sec (11.03 secs) Goal status: INITIAL  3.  Pt will amb. 10" nonstop without device with c/o back pain </= 1/10 for increased activity tolerance. Baseline: 3"45 secs nonstop - no back pain reported Goal status: INITIAL  4.  Pt will report ability to stand for at least 10" to perform kitchen/cooking activity with c/o back pain </= 5/10. Baseline: 2"  standing - back pain varies Goal status: INITIAL  5.  Perform floor to stand transfer with UE support with SBA Baseline: To be assessed Goal status: INITIAL  6.  Independent in HEP including strengthening exercises and balance exercises. Baseline:  Goal status:  INITIAL  CLINICAL IMPRESSION:  PT session focused on re-evaluation with pt's  previous PT session attended on 11-15-22.  Pt's Berg score = 47/56, compared to previous score of 49/56 at PT initial eval in Oct. 2023.  Pt reports no back pain at this time but continues to report decreased standing tolerance.  Pt's TUG score is WNL's with score 9.94 secs without use of assistive device.  Pt's gait speed = 2.97 ft/sec without use of device; pt amb. 3" 45 secs nonstop without device for distance of 575' prior to fatigue.  Pt will benefit from PT to address LE and core strength deficits and higher level balance deficits.    OBJECTIVE IMPAIRMENTS decreased activity tolerance, decreased balance, decreased endurance, difficulty walking, decreased strength, and pain.   ACTIVITY LIMITATIONS carrying, lifting, bending, standing, squatting, stairs, and locomotion level  PARTICIPATION LIMITATIONS: meal prep, cleaning, laundry, shopping, and community activity  PERSONAL FACTORS Age, Fitness, Past/current experiences, and 1 comorbidity: chronic low back pain  are also affecting patient's functional outcome.   REHAB POTENTIAL: Good  CLINICAL DECISION MAKING: Evolving/moderate complexity  EVALUATION COMPLEXITY: Moderate  PLAN: PT FREQUENCY: 1x/week   PT DURATION: 8 weeks  PLANNED INTERVENTIONS: Therapeutic exercises, Therapeutic activity, Neuromuscular re-education, Balance training, Gait training, Patient/Family education, Self Care, Stair training, DME instructions, and Aquatic Therapy  PLAN FOR NEXT SESSION:   LE strengthening, core strengthening   Reniyah Gootee, [place], PT 01/06/2023, 11:38 AM

## 2023-01-06 ENCOUNTER — Encounter: Payer: Self-pay | Admitting: Physical Therapy

## 2023-01-06 NOTE — Progress Notes (Signed)
   01/06/23 0001  Berg Balance Test  Sit to Stand 4  Standing Unsupported 4  Sitting with Back Unsupported but Feet Supported on Floor or Stool 4  Stand to Sit 4  Transfers 4  Standing Unsupported with Eyes Closed 4  Standing Unsupported with Feet Together 4  From Standing, Reach Forward with Outstretched Arm 4  From Standing Position, Pick up Object from Floor 1  From Standing Position, Turn to Look Behind Over each Shoulder 4  Turn 360 Degrees 3 (3.53 to Rt; 4.6 to Lt)  Standing Unsupported, Alternately Place Feet on Step/Stool 1 (15.25 secs with CGA)  Standing Unsupported, One Foot in Front 3 (tandem 19.97 secs Rt foot in front)  Standing on One Leg 3 (8.59 secs on LLE)  Total Score 47

## 2023-01-12 ENCOUNTER — Ambulatory Visit: Payer: Self-pay | Admitting: Physical Therapy

## 2023-01-18 ENCOUNTER — Ambulatory Visit: Payer: Medicare Other | Admitting: Internal Medicine

## 2023-01-19 ENCOUNTER — Ambulatory Visit: Payer: Medicare Other | Admitting: Physical Therapy

## 2023-01-25 ENCOUNTER — Encounter: Payer: Self-pay | Admitting: Internal Medicine

## 2023-01-25 ENCOUNTER — Ambulatory Visit (INDEPENDENT_AMBULATORY_CARE_PROVIDER_SITE_OTHER): Payer: Medicare Other | Admitting: Internal Medicine

## 2023-01-25 VITALS — BP 180/80 | HR 79 | Temp 97.9°F | Resp 16 | Ht 62.0 in | Wt 195.0 lb

## 2023-01-25 DIAGNOSIS — G608 Other hereditary and idiopathic neuropathies: Secondary | ICD-10-CM | POA: Diagnosis not present

## 2023-01-25 DIAGNOSIS — R059 Cough, unspecified: Secondary | ICD-10-CM

## 2023-01-25 MED ORDER — PREGABALIN 50 MG PO CAPS: ORAL_CAPSULE | ORAL | 1 refills | Status: DC

## 2023-01-25 MED ORDER — BENZONATATE 100 MG PO CAPS: ORAL_CAPSULE | ORAL | 1 refills | Status: DC

## 2023-01-25 NOTE — Progress Notes (Signed)
Future Appointments  Date Time Provider Department  03/01/2023  3:00 PM [name], MD [facility]  08/24/2023  3:00 PM [name], MD [facility]  11/20/2023  4:00 PM Alycia Rossetti, NP [facility]    History of Present Illness:      The patient is a  nice 77 y.o. MWF  with HTN, HLD, Pre-Diabetes, CLL  and Vitamin D Deficiency returning for 1 month follow up after tapering her Bupropion & Fluoxetine dosing by 1/5  and reports that she feels much better. She does report persistent dry nonproductive cough & requests "codeine" and also is c/o ongoing burning paresthesias of her feet, but declines Gabapentin due to "hangover  type effect.   Current Outpatient Medications on File Prior to Visit  Medication Sig   acetaminophen (TYLENOL) 325 MG tablet Take 650 mg by mouth every 6 (six) hours as needed for mild pain, fever or headache.   ALPRAZolam (XANAX) 1 MG tablet TAKE 1/2-1 TABLET 2-3 TIMES DAILY ONLY IF NEEDED FOR PANIC ATTACK/ANXIETY ATTACK & LIMIT TO 5 DAYS /WEEK TO AVOID ADDICTION & DEMENTIA   Ascorbic Acid (VITAMIN C PO) 1500 mg by oral route.   bisoprolol-hydrochlorothiazide (ZIAC) 10-6.25 MG tablet Take 1 tablet every Morning for BP & Fluid   buPROPion (WELLBUTRIN XL) 150 MG 24 hr tablet Take  1 tablet  every Morning for Mood / Depression, Focus  & Concentration   Cholecalciferol 50 MCG (2000 UT) TABS 10,000 units   ezetimibe (ZETIA) 10 MG tablet Take  1 tablet  Daily for Cholesterol                                            /                                  TAKE                BY           MOUTH   FLUoxetine (PROZAC) 20 MG capsule Take 1 capsule Daily for Mood, Focus & Concentration   levothyroxine (SYNTHROID) 50 MCG tablet TAKE 1 TABLET(50 MCG) BY MOUTH DAILY   nystatin (MYCOSTATIN) 100000 UNIT/ML suspension 5 ml four times a day, retain in mouth as long as possible (Swish and Spit).  Use for 48 hours after symptoms resolve.   pantoprazole (PROTONIX) 40 MG tablet  Take 1 tablet Daily for Indigestion & Acid Reflux   RESTASIS 0.05 % ophthalmic emulsion Place 1 drop into both eyes 2 (two) times daily as needed (for seasonal allergies/irritation).    No current facility-administered medications on file prior to visit.    Allergies  Allergen Reactions   Hyzaar [Losartan Potassium-Hctz] Swelling and Other (See Comments)    "messed up my sodium counts"and swelled lips   Sudafed [Pseudoephedrine Hcl] Palpitations   Levaquin [Levofloxacin In D5w] Diarrhea and Nausea Only   Acyclovir And Related Other (See Comments)    Reaction not recalled   Biaxin [Clarithromycin]     GI Upset   Cantaloupe Extract Allergy Skin Test Other (See Comments)    Nasal congestion, headaches, and sneezing   Celexa [Citalopram] Other (See Comments)    Reaction not recalled   Flexeril [Cyclobenzaprine]     "zombie-like" feeling   Fosamax [Alendronate Sodium]  GI upset   Gabapentin     confusion   Iohexol Hives and Other (See Comments)    Patient stated she broke out in hive 20 yrs ago from IV contrast      Losartan Swelling and Other (See Comments)    Angioedema   Meloxicam     GI upset   Norvasc [Amlodipine] Swelling and Other (See Comments)    Ankles swell   Other Nausea Only and Other (See Comments)    Iceburg lettuce- "made me dreadfully nauseous" Raw mushrooms only-  "made me dreadfully nauseous" (cooked ones are tolerated)   Pseudoephedrine     Palpitations   Zoloft [Sertraline Hcl] Other (See Comments)    Has no emotions at all      Problem list She has CLL (chronic lymphocytic leukemia) ([place]); Hepatitis A; Class 2 obesity due to excess calories with body mass index (BMI) of 37.0 to 37.9 in adult; Angioedema secondary to ACE/ARB; Abnormal glucose; Depression, major, in remission ([place]); GERD (gastroesophageal reflux disease); Recurrent sinus infections; OSA (obstructive sleep apnea); HTN (hypertension); Vitamin D deficiency; Hypogammaglobulinemia ([place]);  Mixed hyperlipidemia; Medication management; Environmental and seasonal allergies; BMI 37.85,   adult; Immunocompromised ([place]); Thrombocytopenia ([place]); S/P lumbar laminectomy; Chronic bronchitis, unspecified chronic bronchitis type ([place]); Atherosclerosis of aorta ([place]) by CT abd on 04/08/2020; 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   Observations/Objective:  BP (!) 180/80   Pulse 79   Temp 97.9 F (36.6 C)   Resp 16   Ht 5' 2"$  (1.575 m)   Wt 195 lb (88.5 kg)   SpO2 95%   BMI 35.67 kg/m   HEENT - WNL. Neck - supple.  Chest - Clear equal BS. Cor - Nl HS. RRR w/o sig MGR. PP 1(+). No edema. MS- FROM w/o deformities.  Gait Nl. Neuro -  Nl w/o focal abnormalities.   Assessment and Plan:      Follow Up Instructions:        I discussed the assessment and treatment plan with the patient. The patient was provided an opportunity to ask questions and all were answered. The patient agreed with the plan and demonstrated an understanding of the instructions.       The patient was advised to call back or seek an in-person evaluation if the symptoms worsen or if the condition fails to improve as anticipated.    Kirtland Bouchard, MD

## 2023-01-26 ENCOUNTER — Ambulatory Visit: Payer: Medicare Other | Admitting: Physical Therapy

## 2023-01-26 ENCOUNTER — Encounter: Payer: Self-pay | Admitting: Physical Therapy

## 2023-01-26 VITALS — BP 156/79 | HR 64

## 2023-01-26 DIAGNOSIS — R2681 Unsteadiness on feet: Secondary | ICD-10-CM | POA: Diagnosis not present

## 2023-01-26 DIAGNOSIS — M5459 Other low back pain: Secondary | ICD-10-CM | POA: Diagnosis not present

## 2023-01-26 DIAGNOSIS — R2689 Other abnormalities of gait and mobility: Secondary | ICD-10-CM | POA: Diagnosis not present

## 2023-01-26 DIAGNOSIS — M6281 Muscle weakness (generalized): Secondary | ICD-10-CM | POA: Diagnosis not present

## 2023-01-26 NOTE — Therapy (Signed)
OUTPATIENT PHYSICAL THERAPY NEURO TREATMENT NOTE                                                                                                                                                                Patient Name: Rebecca Bond MRN: [id number] DOB:28-Jun-1946, 77 y.o., female Today's Date: 01/26/2023   PCP: [name], MD REFERRING PROVIDER: Earnie Larsson, MD     PT End of Session - 01/26/23 1934     Visit Number 2    Number of Visits 9    Date for PT Re-Evaluation 03/10/23    Authorization Type BCBS Medicare    Authorization Time Period 01-05-23 - 04-04-23    Progress Note Due on Visit 10    PT Start Time [id number]    PT Stop Time 1617    PT Time Calculation (min) 44 min    Equipment Utilized During Treatment Other (comment)   Sitfit, 2# dumbbells, 3# ankle weights   Activity Tolerance Patient tolerated treatment well    Behavior During Therapy [facility] for tasks assessed/performed                       Past Medical History:  Diagnosis Date   Anemia    Anxiety    Arthritis    CLL (chronic lymphocytic leukemia) ([place]) dx'd ~ 2015   Depression    GERD (gastroesophageal reflux disease)    Headache    "sinus headaches"   History of kidney stones    Hypertension    OSA (obstructive sleep apnea)    Osteoarthritis    Pneumonia    Prediabetes    Recurrent sinus infections 08/02/2012   Tubular adenoma of colon    Past Surgical History:  Procedure Laterality Date   ABDOMINAL HYSTERECTOMY  1980's   "endometrosis"   APPENDECTOMY     BREAST SURGERY     CATARACT EXTRACTION, BILATERAL     CHOLECYSTECTOMY  1985   FUNCTIONAL ENDOSCOPIC SINUS SURGERY  1990's   "cause I kept having sinus infections"   immunoglobulin treatment  2017   LAPAROSCOPIC APPENDECTOMY N/A 06/04/2014   Procedure: APPENDECTOMY LAPAROSCOPIC;  Surgeon: Zenovia Jarred, MD;  Location: [place];  Service: General;  Laterality: N/A;   LUMBAR LAMINECTOMY/DECOMPRESSION MICRODISCECTOMY Left  11/01/2017   Procedure: Left Lumbar Four-Five Extraforaminal Microdiscectomy;  Surgeon: Eustace Moore, MD;  Location: [place];  Service: Neurosurgery;  Laterality: Left;   LYMPH NODE BIOPSY     "determined I had CLL"   SHOULDER ARTHROSCOPY Right    TEMPOROMANDIBULAR JOINT ARTHROPLASTY  1980's   TONSILLECTOMY AND ADENOIDECTOMY  1950's   Patient Active Problem List   Diagnosis Date Noted   Hypothyroidism 11/15/2022   Essential hypertension 11/15/2022   Fatty liver 02/01/2022  Pneumonia due to COVID-19 virus 09/08/2021   Norovirus 04/10/2020   AKI (acute kidney injury) ([place]) 04/08/2020   Anxiety with depression 04/08/2020   Hyperbilirubinemia 04/08/2020   Hypocalcemia 04/08/2020   Hypomagnesemia 04/08/2020   Chronic bronchitis, unspecified chronic bronchitis type ([place]) 03/12/2020   Atherosclerosis of aorta ([place]) by CT abd on 04/08/2020 03/12/2020   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   S/P lumbar laminectomy 11/01/2017   Thrombocytopenia ([place]) 05/02/2016   Immunocompromised ([place]) 10/10/2015   BMI 37.85,   adult 09/23/2015   Environmental and seasonal allergies 08/15/2015   Mixed hyperlipidemia 06/01/2015   Medication management 06/01/2015   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   Vitamin D deficiency 09/30/2014   HTN (hypertension) 01/08/2014   Abnormal glucose    Depression, major, in remission ([place])    GERD (gastroesophageal reflux disease)    OSA (obstructive sleep apnea)    Angioedema secondary to ACE/ARB 12/25/2013   Class 2 obesity due to excess calories with body mass index (BMI) of 37.0 to 37.9 in adult 12/17/2013   Hepatitis A 08/02/2012   Recurrent sinus infections 08/02/2012   CLL (chronic lymphocytic leukemia) ([place]) 11/22/2011    ONSET DATE: August 2023 for contraction of pneumonia:  Referral date 12-30-22  REFERRING DIAG: [id number] (ICD-10-CM) - Spinal stenosis, lumbar region, with neurogenic claudication   THERAPY DIAG:  Muscle weakness  (generalized)  Other abnormalities of gait and mobility  Unsteadiness on feet  Rationale for Evaluation and Treatment Rehabilitation  Subjective:   Pt reports she has been doing OK - had to cancel appt. Last week due to having stomach issues; went to MD yesterday - has had elevated BP  Pt accompanied by: self  PERTINENT HISTORY: LUMBAR LAMINECTOMY/DECOMPRESSION MICRODISCECTOMYLeft11/28/2018, CLL (chronic lymphocytic leukemia, HTN, depression  PAIN  Are you having pain? No - pt reports no back pain  PRECAUTIONS: None  WEIGHT BEARING RESTRICTIONS No  FALLS: Has patient fallen in last 6 months? Yes - fell on 12-05-22  LIVING ENVIRONMENT: Lives with: lives with their spouse Lives in: House/apartment Stairs: Yes: Internal: 12 steps; on right going up and External: 2 steps; none Has following equipment at home: None  PLOF: Independent with basic ADLs, Independent with household mobility without device, Independent with community mobility without device, Independent with gait, Independent with transfers, and Needs assistance with homemaking  PATIENT GOALS I want to be able to walk; would like to bend over to load/unload dishwasher be able to play with great-grandson more; 01-05-23 "want to be able to get up off floor"    OBJECTIVE:   Vitals:   01/26/23 1536  BP: (!) 156/79  Pulse: 64    01-26-23:  TherEx: Core stabilization exercises; sitting on SitFit - feet on 4" block on floor; pt performed bil. UE flexion 3 reps:  then performed alternate knee extension/UE flexion with 3 sec hold, 5 reps; progressed to seated marching with feet on step; then progressed from step to black round bolster for moving support; pt performed rolling bolster forward/backward 10 reps Marching in seated position with feet on bolster 10 reps; Pt held 2# dumbbells in each hand - sitting on SitFit performed 5 reps bil. UE flexion to 90 degrees holding dumbbell in each hand  Standing hip extension, flexion,  abduction with 3# weight 10 reps each direction each leg:  standing marching with 3# weight 5 reps each leg Standing knee flexion with 3# weight 10 reps each leg Sit to stand with feet on Airex 10 reps without UE support on mat  Step ups to 6" step with bil. UE support 10 reps each leg  Leg press bil. LE's 50# 10 reps 2 sets, 60# 2 sets 10 reps; seat at position 13        GAIT: Gait pattern: step through pattern  Distance walked: 522' in 2:52 secs Assistive device utilized: None Level of assistance: SBA Gait velocity:  11.03 secs, 13.00 secs 2nd trial = 2.97 ft/sec, 2.52 ft/sec   Pt ambulated 2:52 secs = 522' with no device     PATIENT EDUCATION: Education details: Eval results; reviewed previous HEP - Medbridge [id number]; added trunk rotation and pelvic tilt to HEP  Person educated: Patient Education method: Explanation, demonstration, handout re-issued Education comprehension: verbalized understanding   HOME EXERCISE PROGRAM: [place] 11-01-22; added heel raise - bilateral and unilateral to initial [place]: [id number] URL: [email or web address] Date: 11/02/2022 Prepared by: Ethelene Browns  Exercises - Seated Knee Extension with Resistance  - 1 x daily - 7 x weekly - 1 sets - 10 reps - Seated Hip Abduction with Resistance  - 1 x daily - 7 x weekly - 1 sets - 10 reps  GOALS: Goals reviewed with patient? Yes  SHORT TERM GOALS: Target date: 02-03-23   Pt will improve Berg score to >/= 50/56 to demo improved balance.  Baseline: 47/56 on 01-05-23  Goal status:  Initial  2.  Increase gait velocity to >/= 3.25 ft/sec without device for increased gait efficiency. Baseline: 2.97 ft/sec (11.03 secs); Goal status: Initial   3.  Pt will improve 5 times sit to stand score to </= 13.5 secs without UE support to demo increased LE strength.  Baseline: 15.40 secs without UE support from chair Goal status:  Initial  4.   Pt will amb. 5" nonstop without device with c/o back pain </= 5/10 for increased activity tolerance. Baseline:  10-20-22; pt amb. 3 1/2" nonstop with c/o leg fatigue but no back pain Goal status: Initial  5.  Pt will report ability to stand for at least 5" to perform kitchen/cooking activity with c/o back pain </= 5/10. Baseline: 3" standing - back pain - pt reports "it varies"    Goal status:  Initial  6.  Independent in HEP for LE strengthening and balance exercises, including walking program. Baseline:  Goal status:  Initial    LONG TERM GOALS: Target date: 03-03-23  Improve Berg score to >/ = 53/56 to decrease fall risk. Baseline:  47/56 on 01-05-23 Goal status: INITIAL  2.  Increase gait velocity to >/= 3.5 ft/sec without device for increased gait efficiency. Baseline: 2.97 ft/sec (11.03 secs) Goal status: INITIAL  3.  Pt will amb. 10" nonstop without device with c/o back pain </= 1/10 for increased activity tolerance. Baseline: 3"45 secs nonstop - no back pain reported Goal status: INITIAL  4.  Pt will report ability to stand for at least 10" to perform kitchen/cooking activity with c/o back pain </= 5/10. Baseline: 2" standing - back pain varies Goal status: INITIAL  5.  Perform floor to stand transfer with UE support with SBA Baseline: To be assessed Goal status: INITIAL  6.  Independent in HEP including strengthening exercises and balance exercises. Baseline:  Goal status:  INITIAL  CLINICAL IMPRESSION:  PT session focused on core strengthening and Bil. LE strengthening with use of 3# ankle weights for PRE's for bil. LE's.  Pt able to sit on SitFit and lift 2# dumbbells with minimal difficulty.  Pt reported  mod. Difficulty with sitting on SitFit without UE support and perform seated marching with feet on unstable surface (round bolster).  Pt reported mod. Fatigue at end of session.  Pt was able to increase weight on leg press from 50# to 60# in today's session.  Cont  with POC.     OBJECTIVE IMPAIRMENTS decreased activity tolerance, decreased balance, decreased endurance, difficulty walking, decreased strength, and pain.   ACTIVITY LIMITATIONS carrying, lifting, bending, standing, squatting, stairs, and locomotion level  PARTICIPATION LIMITATIONS: meal prep, cleaning, laundry, shopping, and community activity  PERSONAL FACTORS Age, Fitness, Past/current experiences, and 1 comorbidity: chronic low back pain  are also affecting patient's functional outcome.   REHAB POTENTIAL: Good  CLINICAL DECISION MAKING: Evolving/moderate complexity  EVALUATION COMPLEXITY: Moderate  PLAN: PT FREQUENCY: 1x/week   PT DURATION: 8 weeks  PLANNED INTERVENTIONS: Therapeutic exercises, Therapeutic activity, Neuromuscular re-education, Balance training, Gait training, Patient/Family education, Self Care, Stair training, DME instructions, and Aquatic Therapy  PLAN FOR NEXT SESSION:   LE strengthening, core strengthening   Elena Cothern, [place], PT 01/26/2023, 7:37 PM

## 2023-02-02 ENCOUNTER — Encounter: Payer: Self-pay | Admitting: Physical Therapy

## 2023-02-02 ENCOUNTER — Ambulatory Visit: Payer: Medicare Other | Admitting: Physical Therapy

## 2023-02-02 ENCOUNTER — Other Ambulatory Visit: Payer: Self-pay | Admitting: Nurse Practitioner

## 2023-02-02 DIAGNOSIS — M6281 Muscle weakness (generalized): Secondary | ICD-10-CM

## 2023-02-02 DIAGNOSIS — R2689 Other abnormalities of gait and mobility: Secondary | ICD-10-CM | POA: Diagnosis not present

## 2023-02-02 DIAGNOSIS — R2681 Unsteadiness on feet: Secondary | ICD-10-CM | POA: Diagnosis not present

## 2023-02-02 DIAGNOSIS — M5459 Other low back pain: Secondary | ICD-10-CM | POA: Diagnosis not present

## 2023-02-02 NOTE — Therapy (Addendum)
OUTPATIENT PHYSICAL THERAPY NEURO TREATMENT NOTE                                                                                                                                                                Patient Name: Rebecca Bond MRN: [id number] DOB:[birth year], 77 y.o., female Today's Date: 02/03/2023   PCP: [name], MD REFERRING PROVIDER: Earnie Larsson, MD     PT End of Session - 02/02/23 2048     Visit Number 3    Number of Visits 9    Date for PT Re-Evaluation 03/10/23    Authorization Type BCBS Medicare    Authorization Time Period 01-05-23 - 04-04-23    Progress Note Due on Visit 10    PT Start Time 1536    PT Stop Time 1622    PT Time Calculation (min) 46 min    Equipment Utilized During Treatment Other (comment)   3# ankle weights   Activity Tolerance Patient tolerated treatment well    Behavior During Therapy [facility] for tasks assessed/performed                        Past Medical History:  Diagnosis Date   Anemia    Anxiety    Arthritis    CLL (chronic lymphocytic leukemia) ([place]) dx'd ~ 2015   Depression    GERD (gastroesophageal reflux disease)    Headache    "sinus headaches"   History of kidney stones    Hypertension    OSA (obstructive sleep apnea)    Osteoarthritis    Pneumonia    Prediabetes    Recurrent sinus infections 08/02/2012   Tubular adenoma of colon    Past Surgical History:  Procedure Laterality Date   ABDOMINAL HYSTERECTOMY  1980's   "endometrosis"   APPENDECTOMY     BREAST SURGERY     CATARACT EXTRACTION, BILATERAL     CHOLECYSTECTOMY  1985   FUNCTIONAL ENDOSCOPIC SINUS SURGERY  1990's   "cause I kept having sinus infections"   immunoglobulin treatment  2017   LAPAROSCOPIC APPENDECTOMY N/A 06/04/2014   Procedure: APPENDECTOMY LAPAROSCOPIC;  Surgeon: Zenovia Jarred, MD;  Location: [place];  Service: General;  Laterality: N/A;   LUMBAR LAMINECTOMY/DECOMPRESSION MICRODISCECTOMY Left 11/01/2017   Procedure:  Left Lumbar Four-Five Extraforaminal Microdiscectomy;  Surgeon: Eustace Moore, MD;  Location: [place];  Service: Neurosurgery;  Laterality: Left;   LYMPH NODE BIOPSY     "determined I had CLL"   SHOULDER ARTHROSCOPY Right    TEMPOROMANDIBULAR JOINT ARTHROPLASTY  1980's   TONSILLECTOMY AND ADENOIDECTOMY  1950's   Patient Active Problem List   Diagnosis Date Noted   Hypothyroidism 11/15/2022   Essential hypertension 11/15/2022   Fatty liver 02/01/2022   Pneumonia due  to COVID-19 virus 09/08/2021   Norovirus 04/10/2020   AKI (acute kidney injury) ([place]) 04/08/2020   Anxiety with depression 04/08/2020   Hyperbilirubinemia 04/08/2020   Hypocalcemia 04/08/2020   Hypomagnesemia 04/08/2020   Chronic bronchitis, unspecified chronic bronchitis type ([place]) 03/12/2020   Atherosclerosis of aorta ([place]) by CT abd on 04/08/2020 03/12/2020   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   S/P lumbar laminectomy 11/01/2017   Thrombocytopenia ([place]) 05/02/2016   Immunocompromised ([place]) 10/10/2015   BMI 37.85,   adult 09/23/2015   Environmental and seasonal allergies 08/15/2015   Mixed hyperlipidemia 06/01/2015   Medication management 06/01/2015   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   Vitamin D deficiency 09/30/2014   HTN (hypertension) 01/08/2014   Abnormal glucose    Depression, major, in remission ([place])    GERD (gastroesophageal reflux disease)    OSA (obstructive sleep apnea)    Angioedema secondary to ACE/ARB 12/25/2013   Class 2 obesity due to excess calories with body mass index (BMI) of 37.0 to 37.9 in adult 12/17/2013   Hepatitis A 08/02/2012   Recurrent sinus infections 08/02/2012   CLL (chronic lymphocytic leukemia) ([place]) 11/22/2011    ONSET DATE: August 2023 for contraction of pneumonia:  Referral date 12-30-22  REFERRING DIAG: [id number] (ICD-10-CM) - Spinal stenosis, lumbar region, with neurogenic claudication   THERAPY DIAG:  Muscle weakness (generalized)  Unsteadiness on  feet  Rationale for Evaluation and Treatment Rehabilitation  Subjective:   Pt reports she is having runny nose due to allergies due to increased pollen - pt states she is feeling slightly dizzy today, thinks it is also due to her allergies  Pt accompanied by: self  PERTINENT HISTORY: LUMBAR LAMINECTOMY/DECOMPRESSION MICRODISCECTOMYLeft11/28/2018, CLL (chronic lymphocytic leukemia, HTN, depression  PAIN  Are you having pain? No pain but is having a headache   PRECAUTIONS: None  WEIGHT BEARING RESTRICTIONS No  FALLS: Has patient fallen in last 6 months? Yes - fell on 12-05-22  LIVING ENVIRONMENT: Lives with: lives with their spouse Lives in: House/apartment Stairs: Yes: Internal: 12 steps; on right going up and External: 2 steps; none Has following equipment at home: None  PLOF: Independent with basic ADLs, Independent with household mobility without device, Independent with community mobility without device, Independent with gait, Independent with transfers, and Needs assistance with homemaking  PATIENT GOALS I want to be able to walk; would like to bend over to load/unload dishwasher be able to play with great-grandson more; 01-05-23 "want to be able to get up off floor"    OBJECTIVE:   There were no vitals filed for this visit.   02-02-23:  TherEx:  5x sit to stand = 11.44 secs from standard chair without UE support     Runner's stretch for bil. Hamstring & gastroc stretching 30 sec hold x 1 rep each leg              Standing hip extension, flexion, abduction with 3# weight 10 reps each direction each leg:  standing marching with 3# weight 10 reps each leg Sit to stand with feet on floor 5 reps without UE support from chair Step ups to 6" step with bil. UE support 10 reps each leg  Leg press bil. LE's 60#  5 sets 10 reps; 70# 2 reps;   seat at position 13 SciFit level 3.0 x 5" with LE's only for bil. LE strengthening  Wall squats - pt stood against door - held squat  position for quad strengthening 10 secs x 3 reps  GAIT: Gait pattern: step through pattern  Distance walked:  clinic distances  Assistive device utilized: None Level of assistance: SBA Gait velocity:  8.47 secs = 3.87 ft/sec with no device        PATIENT EDUCATION: Education details: Eval results; reviewed previous HEP - Medbridge [id number]; added trunk rotation and pelvic tilt to HEP  Person educated: Patient Education method: Explanation, demonstration, handout re-issued Education comprehension: verbalized understanding   HOME EXERCISE PROGRAM: [place] 11-01-22; added heel raise - bilateral and unilateral to initial [place]  Access Code: [email or web address] URL: [email or web address] Date: 11/02/2022 Prepared by: Ethelene Browns  Exercises - Seated Knee Extension with Resistance  - 1 x daily - 7 x weekly - 1 sets - 10 reps - Seated Hip Abduction with Resistance  - 1 x daily - 7 x weekly - 1 sets - 10 reps  GOALS: Goals reviewed with patient? Yes  SHORT TERM GOALS: Target date: 02-03-23   Pt will improve Berg score to >/= 50/56 to demo improved balance.  Baseline: 47/56 on 01-05-23  Goal status:  Ongoing  2.  Increase gait velocity to >/= 3.25 ft/sec without device for increased gait efficiency. Baseline: 2.97 ft/sec (11.03 secs);  02-02-23 = 9.37, 8.47 secs = 3.87 ft/sec Goal status: Goal met 02-02-23  3.  Pt will improve 5 times sit to stand score to </= 13.5 secs without UE support to demo increased LE strength.  Baseline: 15.40 secs without UE support from chair;  11.44 secs from chair Goal status:  Goal met 02-02-23  4.  Pt will amb. 5" nonstop without device with c/o back pain </= 5/10 for increased activity tolerance. Baseline:  10-20-22; pt amb. 3 1/2" nonstop with c/o leg fatigue but no back pain Goal status: Ongoing   5.  Pt will report ability to stand for at least 5" to perform kitchen/cooking activity with c/o  back pain </= 5/10. Baseline: 3" standing - back pain - pt reports "it varies" ; pt reports ability to stand for approx. 6" with pain intensity 7-8/10   Goal status:  Partially met 02-02-23  6.  Independent in HEP for LE strengthening and balance exercises, including walking program. Baseline:  Goal status: Goal met 02-02-23    LONG TERM GOALS: Target date: 03-03-23  Improve Berg score to >/ = 53/56 to decrease fall risk. Baseline:  47/56 on 01-05-23 Goal status: INITIAL  2.  Increase gait velocity to >/= 3.5 ft/sec without device for increased gait efficiency. Baseline: 2.97 ft/sec (11.03 secs) Goal status: INITIAL  3.  Pt will amb. 10" nonstop without device with c/o back pain </= 1/10 for increased activity tolerance. Baseline: 3"45 secs nonstop - no back pain reported Goal status: INITIAL  4.  Pt will report ability to stand for at least 10" to perform kitchen/cooking activity with c/o back pain </= 5/10. Baseline: 2" standing - back pain varies Goal status: INITIAL  5.  Perform floor to stand transfer with UE support with SBA Baseline: To be assessed Goal status: INITIAL  6.  Independent in HEP including strengthening exercises and balance exercises. Baseline:  Goal status:  INITIAL  CLINICAL IMPRESSION:  Pt has met STG's # 2, 3 and 6:  STG's #1 and 4 are ongoing as Berg not assessed in today's session due to time constraint and pt requested to walk 5" nonstop at later date in order to focus on strengthening exercises in today's session.  STG #5 is partially met as pt  reports ability to stand for at least 6" at home with kitchen/cooking activity but with back pain intensity varying, usually at 7-8/10 intensity.  Pt tolerating PRE's well and is progressing towards LTG's.  Cont with POC.     OBJECTIVE IMPAIRMENTS decreased activity tolerance, decreased balance, decreased endurance, difficulty walking, decreased strength, and pain.   ACTIVITY LIMITATIONS carrying, lifting,  bending, standing, squatting, stairs, and locomotion level  PARTICIPATION LIMITATIONS: meal prep, cleaning, laundry, shopping, and community activity  PERSONAL FACTORS Age, Fitness, Past/current experiences, and 1 comorbidity: chronic low back pain  are also affecting patient's functional outcome.   REHAB POTENTIAL: Good  CLINICAL DECISION MAKING: Evolving/moderate complexity  EVALUATION COMPLEXITY: Moderate  PLAN: PT FREQUENCY: 1x/week   PT DURATION: 8 weeks  PLANNED INTERVENTIONS: Therapeutic exercises, Therapeutic activity, Neuromuscular re-education, Balance training, Gait training, Patient/Family education, Self Care, Stair training, DME instructions, and Aquatic Therapy  PLAN FOR NEXT SESSION:   LE strengthening, core strengthening   Elleen Coulibaly, [place], PT 02/03/2023, 2:38 PM

## 2023-02-03 ENCOUNTER — Telehealth: Payer: Self-pay | Admitting: Internal Medicine

## 2023-02-03 NOTE — Progress Notes (Signed)
  Chronic Care Management   Outreach Note  02/03/2023 Name: Rebecca Bond MRN: [id number] DOB: 1946/07/20  Referred by: [name], MD Reason for referral : No chief complaint on file.   An unsuccessful telephone outreach was attempted today. The patient was referred to the pharmacist for assistance with care management and care coordination.   Follow Up Plan:   Tatjana Dellinger Upstream Scheduler

## 2023-02-09 ENCOUNTER — Ambulatory Visit: Payer: Medicare Other | Admitting: Physical Therapy

## 2023-02-16 ENCOUNTER — Ambulatory Visit: Payer: Medicare Other | Admitting: Physical Therapy

## 2023-02-23 ENCOUNTER — Ambulatory Visit: Payer: Medicare Other | Admitting: Physical Therapy

## 2023-03-01 ENCOUNTER — Encounter: Payer: Medicare Other | Admitting: Internal Medicine

## 2023-03-16 ENCOUNTER — Encounter: Payer: Self-pay | Admitting: Physical Therapy

## 2023-03-16 NOTE — Therapy (Signed)
        PHYSICAL THERAPY DISCHARGE SUMMARY   Patient Name:  Poet, Linke Date of Birth:  July 17, 2046 Referring Physician:  Julio Sicks,  MD    Visits from [facility] of Care: 3 (since re-evaluation on 01-05-23); pt was previously seen from 09-20-22 - 11-15-22 for total of 7 visits; pt had multiple cancellations intermittently during this POC for various reasons  Current functional level related to goals / functional outcomes: LTG's were unable to be assessed due to pt not returning to PT.    Remaining deficits: Continued c/o chronic LBP Pt continued to have decrease strength in bil. LE's and decreased standing tolerance with decreased ability to ambulate prolonged distances due to c/o back pain   Education / Equipment: Pt had been instructed in HEP for low back strengthening and stretching.   Patient agrees to discharge. Patient goals were  unable to be assessed due to pt not returning to PT due to change in medical status.  Pt was going to follow up with MD prior to returning to PT.  Patient is being discharged due to not returning since the last visit. Pt has been informed that a new referral will be needed in order to return to PT; pt verbalized understanding.   Thank you for this referral, Kerry Fort, PT 03-16-23, 10:47 AM [facility] [address], [address] [place], [place] [address] [phone]

## 2023-04-25 ENCOUNTER — Telehealth: Payer: Self-pay | Admitting: Hematology

## 2023-04-27 ENCOUNTER — Telehealth: Payer: Self-pay | Admitting: Hematology

## 2023-04-27 ENCOUNTER — Ambulatory Visit: Payer: Medicare Other | Admitting: Internal Medicine

## 2023-04-27 ENCOUNTER — Encounter: Payer: Medicare Other | Admitting: Internal Medicine

## 2023-04-27 NOTE — Telephone Encounter (Signed)
Rescheduled appointments per patients request via incoming call by the patient. Patient is aware of the changes made to her upcoming appointments.

## 2023-05-03 ENCOUNTER — Other Ambulatory Visit: Payer: Medicare Other

## 2023-05-03 ENCOUNTER — Ambulatory Visit: Payer: Medicare Other | Admitting: Hematology

## 2023-05-10 ENCOUNTER — Ambulatory Visit: Payer: Medicare Other | Admitting: Hematology

## 2023-05-10 ENCOUNTER — Other Ambulatory Visit: Payer: Medicare Other

## 2023-05-21 ENCOUNTER — Other Ambulatory Visit: Payer: Self-pay | Admitting: Internal Medicine

## 2023-05-21 DIAGNOSIS — I10 Essential (primary) hypertension: Secondary | ICD-10-CM

## 2023-05-23 ENCOUNTER — Other Ambulatory Visit: Payer: Self-pay | Admitting: Nurse Practitioner

## 2023-05-25 DIAGNOSIS — M48062 Spinal stenosis, lumbar region with neurogenic claudication: Secondary | ICD-10-CM | POA: Diagnosis not present

## 2023-05-26 ENCOUNTER — Telehealth: Payer: Self-pay | Admitting: Hematology

## 2023-05-30 ENCOUNTER — Inpatient Hospital Stay: Payer: Medicare Other

## 2023-05-30 ENCOUNTER — Inpatient Hospital Stay: Payer: Medicare Other | Admitting: Hematology

## 2023-06-05 DIAGNOSIS — M7061 Trochanteric bursitis, right hip: Secondary | ICD-10-CM | POA: Diagnosis not present

## 2023-06-05 DIAGNOSIS — M5416 Radiculopathy, lumbar region: Secondary | ICD-10-CM | POA: Diagnosis not present

## 2023-06-06 ENCOUNTER — Other Ambulatory Visit: Payer: Self-pay | Admitting: Nurse Practitioner

## 2023-06-20 DIAGNOSIS — M5416 Radiculopathy, lumbar region: Secondary | ICD-10-CM | POA: Diagnosis not present

## 2023-07-06 ENCOUNTER — Ambulatory Visit (INDEPENDENT_AMBULATORY_CARE_PROVIDER_SITE_OTHER): Payer: Medicare Other | Admitting: Internal Medicine

## 2023-07-06 ENCOUNTER — Encounter: Payer: Self-pay | Admitting: Internal Medicine

## 2023-07-06 VITALS — BP 144/80 | HR 80 | Temp 98.0°F | Resp 17 | Ht 62.0 in | Wt 207.6 lb

## 2023-07-06 DIAGNOSIS — F419 Anxiety disorder, unspecified: Secondary | ICD-10-CM | POA: Diagnosis not present

## 2023-07-06 DIAGNOSIS — R300 Dysuria: Secondary | ICD-10-CM | POA: Diagnosis not present

## 2023-07-06 MED ORDER — NITROFURANTOIN MONOHYD MACRO 100 MG PO CAPS
100.0000 mg | ORAL_CAPSULE | Freq: Two times a day (BID) | ORAL | 0 refills | Status: AC
Start: 2023-07-06 — End: 2023-07-13

## 2023-07-06 MED ORDER — HYDROXYZINE HCL 10 MG PO TABS
ORAL_TABLET | ORAL | 0 refills | Status: DC
Start: 2023-07-06 — End: 2023-07-27

## 2023-07-06 NOTE — Patient Instructions (Signed)
Asymptomatic Bacteriuria Asymptomatic bacteriuria is the presence of a large number of bacteria in the urine without the usual symptoms of burning or frequent urination. This is usually not harmful, and treatment may not be needed. A person with this condition will not be more likely to develop an infection in the future. What are the causes? This condition is caused by an increase in bacteria in the urine. This increase can be caused by: Bacteria entering the urinary tract, such as during sex. A blockage in the urinary tract, such as from kidney stones or a tumor. Bladder problems that prevent the bladder from emptying. What increases the risk? You are more likely to develop this condition if: You have diabetes. You are an older adult. This especially affects older adults in long-term care facilities. You are pregnant and in the first trimester. You have kidney stones. You are female. You have had a kidney transplant. You have a leaky kidney tube valve (reflux). You had a urinary catheter for a long period of time. This is a long, thin tube that collects urine. What are the signs or symptoms? There are no symptoms of this condition. How is this diagnosed? This condition is diagnosed with a urine test. Because this condition does not cause symptoms, it is usually diagnosed when a urine sample is taken to treat or diagnose another condition, such as pregnancy or kidney problems. Most women who are in their first trimester of pregnancy are screened for asymptomatic bacteriuria. How is this treated? Usually, treatment is not needed for this condition. Treating the condition can lead to other problems, such as a yeast infection or the growth of bacteria that do not respond to treatment (antibiotic-resistant bacteria). Some people do need treatment with antibiotic medicines to prevent kidney infection, known as pyelonephritis. Treatment is needed if: You are pregnant. In pregnant women, kidney  infection can lead to: Early labor (premature labor). Very low birth weight (fetal growth restriction). Newborn death. You are having a procedure that affects the urinary tract. You have had a kidney transplant. If you are diagnosed with this condition, talk with your health care provider about any concerns that you have. Follow these instructions at home: Medicines Take over-the-counter and prescription medicines only as told by your health care provider. If you were prescribed an antibiotic medicine, take it as told by your health care provider. Do not stop using the antibiotic even if you start to feel better. General instructions Monitor your condition for any changes. Drink enough fluid to keep your urine pale yellow. Urinate more often to keep your bladder empty. If you are female, keep the area around your vagina and rectum clean. Wipe from front to back after urinating or having a bowel movement. Use each piece of toilet paper only once. Keep all follow-up visits. This is important. Contact a health care provider if: You have symptoms of a urine infection, such as: A burning sensation, or pain when you urinate. A strong need to urinate, or urinating more often. Urine turning discolored or cloudy. Blood in your urine. Urine that smells bad. Get help right away if: You develop signs of a kidney infection, such as: Back pain or pelvic pain. A fever or chills. Nausea or vomiting. Severe pain that cannot be controlled with medicine. Summary Asymptomatic bacteriuria is the presence of a large number of bacteria in the urine without the usual symptoms of burning or frequent urination. Usually, treatment is not needed for this condition. Treating the condition can lead  to other problems, such as a yeast infection or the growth of bacteria that do not respond to treatment. Some people do need treatment. Treatment is needed if you are pregnant, if you are having a procedure that  affects the urinary tract, or if you have had a kidney transplant. If you were prescribed an antibiotic medicine, take it as told by your health care provider. Do not stop using the antibiotic even if you start to feel better. This information is not intended to replace advice given to you by your health care provider. Make sure you discuss any questions you have with your health care provider. Document Revised: 06/28/2020 Document Reviewed: 07/03/2020 Elsevier Patient Education  2024 [employer].

## 2023-07-06 NOTE — Progress Notes (Signed)
Future Appointments  Date Time Provider Department  07/06/2023  1:45 PM [name], MD [facility]  07/31/2023  2:00 PM Johney Maine, MD [facility]  08/31/2023  3:00 PM [name], MD [facility]  11/20/2023  4:00 PM Raynelle Dick, NP [facility]    History of Present Illness:      The patient is a  nice 77 y.o. MWF  with HTN, HLD, Pre-Diabetes, CLL  and Vitamin D Deficiency presents with c/o urinary burning &urgency Denies fever , chills, sweats or rash.    Current Outpatient Medications on File Prior to Visit  Medication Sig   acetaminophen (TYLENOL) 325 MG tablet Take 650 mg by mouth every 6 (six) hours as needed for mild pain, fever or headache.   ALPRAZolam (XANAX) 1 MG tablet TAKE 1/2-1 TABLET 1-2 TIMES DAILY ONLY IF NEEDED    Ascorbic Acid (VITAMIN C PO) 1500 mg by oral route.   benzonatate  PERLES 100 MG capsule Take  1 to 2 perles  3 x /day  as needed for  cough.   bisoprolol-hctz 10-6.25 MG tablet Take  1 tablet every Morning    buPROPion XL 150 MG 24 hr tablet Take  1 tablet  every Morning for Mood / Depression, Focus  & Concentration   Cholecalciferol 50 MCG (2000 UT) TABS 10,000 units   ezetimibe (ZETIA) 10 MG tablet Take  1 tablet  Daily    FLUoxetine (PROZAC) 20 MG capsule Take 1 capsule Daily for Mood, Focus & Concentration   levothyroxine (SYNTHROID) 50 MCG tablet TAKE 1 TABLET(50 MCG) BY MOUTH DAILY   nystatin (MYCOSTATIN) 100,000 UNIT/ML suspension 5 ml four times a day, retain in mouth as long as possible (Swish and Spit).  Use for 48 hours after symptoms resolve.   pantoprazole (PROTONIX) 40 MG tablet Take  1 tablet  Daily    pregabalin (LYRICA) 50 MG capsule Take  1 tablet  3 x /day   as needed for painful burning pains   RESTASIS 0.05 % ophthalmic emulsion Place 1 drop into both eyes 2 (two) times daily as needed (for seasonal allergies/irritation).      Allergies  Allergen Reactions   Hyzaar [Losartan -Hctz] Swelling and Other (See  Comments)   Sudafed [Pseudoephedrine Hcl] Palpitations   Levaquin [Levofloxacin In D5w] Diarrhea and Nausea Only   Acyclovir And Related Reaction not recalled   Biaxin [Clarithromycin] GI Upset   Cantaloupe Extract Allergy Skin Test Nasal congestion, HAs, and sneezing   Celexa [Citalopram] Reaction not recalled   Flexeril [Cyclobenzaprine] "zombie-like" feeling   Fosamax [Alendronate Sodium] GI upset   Gabapentin confusion   Iohexol Hives and Other (See Comments)    Patient stated she broke out in hive 20 yrs ago from IV contrast     Losartan Angioedema   Meloxicam GI upset   Norvasc [Amlodipine] Ankles swell   Other Nausea Only and Other (See Comments)    Iceburg lettuce- "made me dreadfully nauseous" Raw mushrooms only-  "made me dreadfully nauseous" (cooked ones are tolerated)   Pseudoephedrine Palpitations   Zoloft [Sertraline Hcl] Has no emotions at all      Problem list She has CLL (chronic lymphocytic leukemia) (HCC); Hepatitis A; Class 2 obesity due to excess calories with body mass index (BMI) of 37.0 to 37.9 in adult; Angioedema secondary to ACE/ARB; Abnormal glucose; Depression, major, in remission (HCC); GERD (gastroesophageal reflux disease); Recurrent sinus infections; OSA (obstructive sleep apnea); HTN (hypertension); Vitamin D deficiency; Hypogammaglobulinemia (HCC); Mixed  hyperlipidemia; Medication management; Environmental and seasonal allergies; BMI 37.85,   adult; Immunocompromised (HCC); Thrombocytopenia (HCC); S/P lumbar laminectomy; Chronic bronchitis, unspecified chronic bronchitis type (HCC); Atherosclerosis of aorta (HCC) by CT abd on 04/08/2020; 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HCC); AKI (acute kidney injury) (HCC); Anxiety with depression; Hyperbilirubinemia; Hypocalcemia; Hypomagnesemia; Norovirus; Pneumonia due to COVID-19 virus; Fatty liver; Hypothyroidism; and Essential hypertension on their problem list.   Observations/Objective:  BP (!) 144/80   Pulse 80   Temp  98 F (36.7 C)   Resp 17   Ht 5\' 2"  (1.575 m)   Wt 207 lb 9.6 oz (94.2 kg)   SpO2 95%   BMI 37.97 kg/m   HEENT - WNL. Neck - supple.  Chest - Clear equal BS. No CVAT.  Cor - Nl HS. RRR w/o sig MGR. PP 1(+). No edema. Abd - Soft , non-tender & benign.  MS- FROM w/o deformities.  Gait Nl. Neuro -  Nl w/o focal abnormalities.   Assessment and Plan:   1. Dysuria  - Urinalysis, Routine w reflex microscopic - Urine Culture  - nitrofurantoin 100 MG capsule;  Take 1 capsule  2  times daily for 7 days.   Dispense: 14 capsule; Refill: 0   2. Anxiety  - hydrOXYzine (ATARAX) 10 MG tablet;  Take 1 tablet 4 x / day for Anxiety  Dispense:     Follow Up Instructions:        I discussed the assessment and treatment plan with the patient. The patient was provided an opportunity to ask questions and all were answered. The patient agreed with the plan and demonstrated an understanding of the instructions.       The patient was advised to call back or seek an in-person evaluation if the symptoms worsen or if the condition fails to improve as anticipated.    Marinus Maw, MD

## 2023-07-09 ENCOUNTER — Other Ambulatory Visit: Payer: Self-pay | Admitting: Internal Medicine

## 2023-07-09 DIAGNOSIS — N3000 Acute cystitis without hematuria: Secondary | ICD-10-CM

## 2023-07-09 NOTE — Progress Notes (Signed)
^<^<^<^<^<^<^<^<^<^<^<^<^<^<^<^<^<^<^<^<^<^<^<^<^<^<^<^<^<^<^<^<^<^<^<^<^ ^>^>^>^>^>^>^>^>^>^>^>>^>^>^>^>^>^>^>^>^>^>^>^>^>^>^>^>^>^>^>^>^>^>^>^>^>  -   U/C is (+)  and is fortunately sensitive to the Rx Macrodantin that                                                            you were prescribed, So please complete it   - Please schedule a NV in 1 month to recheck U/A  & C/S.   ^<^<^<^<^<^<^<^<^<^<^<^<^<^<^<^<^<^<^<^<^<^<^<^<^<^<^<^<^<^<^<^<^<^<^<^<^ ^>^>^>^>^>^>^>^>^>^>^>^>^>^>^>^>^>^>^>^>^>^>^>^>^>^>^>^>^>^>^>^>^>^>^>^>^  -

## 2023-07-10 NOTE — Progress Notes (Signed)
Patient is aware of lab results and 1 mo. NV has been scheduled. -e welch

## 2023-07-13 ENCOUNTER — Ambulatory Visit: Payer: Medicare Other | Admitting: Internal Medicine

## 2023-07-20 ENCOUNTER — Ambulatory Visit: Payer: Medicare Other | Admitting: Internal Medicine

## 2023-07-20 ENCOUNTER — Encounter: Payer: Self-pay | Admitting: Internal Medicine

## 2023-07-20 VITALS — BP 178/86 | HR 57 | Temp 97.9°F | Resp 18 | Ht 62.0 in | Wt 209.6 lb

## 2023-07-20 DIAGNOSIS — Z1152 Encounter for screening for COVID-19: Secondary | ICD-10-CM | POA: Diagnosis not present

## 2023-07-20 DIAGNOSIS — R6889 Other general symptoms and signs: Secondary | ICD-10-CM

## 2023-07-20 DIAGNOSIS — G608 Other hereditary and idiopathic neuropathies: Secondary | ICD-10-CM

## 2023-07-20 DIAGNOSIS — J0410 Acute tracheitis without obstruction: Secondary | ICD-10-CM

## 2023-07-20 LAB — POCT INFLUENZA A/B
Influenza A, POC: NEGATIVE
Influenza B, POC: NEGATIVE

## 2023-07-20 LAB — POC COVID19 BINAXNOW: SARS Coronavirus 2 Ag: NEGATIVE

## 2023-07-20 MED ORDER — PROMETHAZINE-DM 6.25-15 MG/5ML PO SYRP: ORAL_SOLUTION | ORAL | 1 refills | Status: DC

## 2023-07-20 MED ORDER — MONTELUKAST SODIUM 10 MG PO TABS: ORAL_TABLET | ORAL | 3 refills | Status: DC

## 2023-07-20 MED ORDER — DEXAMETHASONE 4 MG PO TABS: ORAL_TABLET | ORAL | 0 refills | Status: DC

## 2023-07-20 MED ORDER — PREGABALIN 50 MG PO CAPS
ORAL_CAPSULE | ORAL | 1 refills | Status: AC
Start: 2023-07-20 — End: ?

## 2023-07-20 NOTE — Progress Notes (Signed)
Future Appointments  Date Time Provider Department  07/20/2023  2:30 PM [name], MD [facility]  07/31/2023  1:30 PM [facility]  07/31/2023  2:00 PM Johney Maine, MD [facility]  08/10/2023  2:00 PM [facility] NURSE [facility]  08/31/2023  3:00 PM [name], MD [facility]  12/05/2023  2:30 PM Raynelle Dick, NP [facility]    History of Present Illness:     The patient is a  nice 77 y.o. MWF  with HTN, HLD, Pre-Diabetes, CLL  and Vitamin D Deficiency with 3-4 day prodrome of HAs, myalgias, chills, no fever, Dry cough  / minimal clear sputum.  Covid & Flu tests are Negative.       Current Outpatient Medications on File Prior to Visit  Medication Sig   acetaminophen  325 MG tablet Take 650 mg  every 6  hours as needed   VITAMIN C    1500 mg Daily   bisoprolol-hctz 10-6.25 MG tablet Take  1 tablet every Morning   buPROPion  XL 150 MG  Take  1 tablet  every Morning    Cholecalciferol   10,000 units Daily   ezetimibe  10 MG tablet Take  1 tablet  Daily    FLUoxetine 20 MG capsule Take 1 capsule Daily    hydrOXYzine 10 MG tablet Take 1 tablet 4 x / day for Anxiety   levothyroxine 50 MCG tablet TAKE 1 TABLET  DAILY   Nystatin  100,000 UNIT/ML susp 5 ml four times a day   pregabalin (LYRICA) 50 MG capsule Take  1 tablet  3 x /day   as needed   RESTASIS 0.05 % ophth emulsion Place 1 drop into  eyes 2  times daily as needed     Allergies  Allergen Reactions   Hyzaar [Losartan Potassium-Hctz] Swelling and Other (See Comments)    "messed up my sodium counts"and swelled lips   Sudafed [Pseudoephedrine Hcl] Palpitations   Levaquin [Levofloxacin In D5w] Diarrhea and Nausea Only   Acyclovir And Related Other (See Comments)    Reaction not recalled   Biaxin [Clarithromycin]     GI Upset   Cantaloupe Extract Allergy Skin Test Other (See Comments)    Nasal congestion, headaches, and sneezing   Celexa [Citalopram] Other (See Comments)    Reaction  not recalled   Flexeril [Cyclobenzaprine]     "zombie-like" feeling   Fosamax [Alendronate Sodium]     GI upset   Gabapentin     confusion   Iohexol Hives and Other (See Comments)    Patient stated she broke out in hive 20 yrs ago from IV contrast      Losartan Swelling and Other (See Comments)    Angioedema   Meloxicam     GI upset   Norvasc [Amlodipine] Swelling and Other (See Comments)    Ankles swell   Other Nausea Only and Other (See Comments)    Iceburg lettuce- "made me dreadfully nauseous" Raw mushrooms only-  "made me dreadfully nauseous" (cooked ones are tolerated)   Pseudoephedrine     Palpitations   Zoloft [Sertraline Hcl] Other (See Comments)    Has no emotions at all      Problem list She has CLL (chronic lymphocytic leukemia) (HCC); Hepatitis A; Class 2 obesity due to excess calories with body mass index (BMI) of 37.0 to 37.9 in adult; Angioedema secondary to ACE/ARB; Abnormal glucose; Depression, major, in remission (HCC); GERD (gastroesophageal reflux disease); Recurrent sinus infections; OSA (obstructive  sleep apnea); HTN (hypertension); Vitamin D deficiency; Hypogammaglobulinemia (HCC); Mixed hyperlipidemia; Medication management; Environmental and seasonal allergies; BMI 37.85,   adult; Immunocompromised (HCC); Thrombocytopenia (HCC); S/P lumbar laminectomy; Chronic bronchitis, unspecified chronic bronchitis type (HCC); Atherosclerosis of aorta (HCC) by CT abd on 04/08/2020; 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HCC); AKI (acute kidney injury) (HCC); Anxiety with depression; Hyperbilirubinemia; Hypocalcemia; Hypomagnesemia; Norovirus; Pneumonia due to COVID-19 virus; Fatty liver; Hypothyroidism; and Essential hypertension on their problem list.   Observations/Objective:  BP (!) 178/86   Pulse (!) 57   Temp 97.9 F (36.6 C)   Resp 18   Ht 5\' 2"  (1.575 m)   Wt 209 lb 9.6 oz (95.1 kg)   SpO2 97%   BMI 38.34 kg/m   Dry cough. No Stridor . No rash, cyanosis.   HEENT -  WNL. Neck - supple.  Chest - Clear equal BS. No rales, rhonchi or wheezes.  Cor - Nl HS. RRR w/o sig MGR. PP 1(+). No edema. MS- FROM w/o deformities.  Gait Nl. Neuro -  Nl w/o focal abnormalities.   Assessment and Plan:   1. Tracheitis  ? Viral vs Allergy   - dexamethasone  4 MG tablet;  Take 1 tab 3 x day - 3 days, then 2 x day - 3 days, then 1 tab daily   Dispense: 20 tablet; Refill: 0  - promethazine--DM) 6.25-15 MG/5ML syrup;  Take 1 tsp every 4 hours if needed for cough   Dispense: 240 mL; Refill: 1  - montelukast (SINGULAIR) 10 MG tablet;  Take 1 tablet daily for Allergies & Asthma   Dispense: 90 tablet; Refill: 3  2. Flu-like symptoms  - POCT Influenza A/B  3. Encounter for screening for COVID-19  - POC COVID-19  4. Peripheral sensory neuropathy  -  Refilled  Pregabalin  (LYRICA) 50 MG capsule   Follow Up Instructions:        I discussed the assessment and treatment plan with the patient. The patient was provided an opportunity to ask questions and all were answered. The patient agreed with the plan and demonstrated an understanding of the instructions.       The patient was advised to call back or seek an in-person evaluation if the symptoms worsen or if the condition fails to improve as anticipated.    Marinus Maw, MD

## 2023-07-21 NOTE — Patient Instructions (Signed)
Upper Respiratory Infection  An upper respiratory infection (URI) is a common viral infection of the nose, throat, and upper air passages that lead to the lungs. The most common type of URI is the common cold. URIs usually get better on their own, without medical treatment. What are the causes? A URI is caused by a virus. You may catch a virus by: Breathing in droplets from an infected person's cough or sneeze. Touching something that has been exposed to the virus (is contaminated) and then touching your mouth, nose, or eyes. What increases the risk? You are more likely to get a URI if: You are very young or very old. You have close contact with others, such as at work, school, or a health care facility. You smoke. You have long-term (chronic) heart or lung disease. You have a weakened disease-fighting system (immune system). You have nasal allergies or asthma. You are experiencing a lot of stress. You have poor nutrition. What are the signs or symptoms? A URI usually involves some of the following symptoms: Runny or stuffy (congested) nose. Cough. Sneezing. Sore throat. Headache. Fatigue. Fever. Loss of appetite. Pain in your forehead, behind your eyes, and over your cheekbones (sinus pain). Muscle aches. Redness or irritation of the eyes. Pressure in the ears or face. How is this diagnosed? This condition may be diagnosed based on your medical history and symptoms, and a physical exam. Your health care provider may use a swab to take a mucus sample from your nose (nasal swab). This sample can be tested to determine what virus is causing the illness. How is this treated? URIs usually get better on their own within 7-10 days. Medicines cannot cure URIs, but your health care provider may recommend certain medicines to help relieve symptoms, such as: Over-the-counter cold medicines. Cough suppressants. Coughing is a type of defense against infection that helps to clear the  respiratory system, so take these medicines only as recommended by your health care provider. Fever-reducing medicines. Follow these instructions at home: Activity Rest as needed. If you have a fever, stay home from work or school until your fever is gone or until your health care provider says your URI cannot spread to other people (is no longer contagious). Your health care provider may have you wear a face mask to prevent your infection from spreading. Relieving symptoms Gargle with a mixture of salt and water 3-4 times a day or as needed. To make salt water, completely dissolve -1 tsp (3-6 g) of salt in 1 cup (237 mL) of warm water. Use a cool-mist humidifier to add moisture to the air. This can help you breathe more easily. Eating and drinking  Drink enough fluid to keep your urine pale yellow. Eat soups and other clear broths. General instructions  Take over-the-counter and prescription medicines only as told by your health care provider. These include cold medicines, fever reducers, and cough suppressants. Do not use any products that contain nicotine or tobacco. These products include cigarettes, chewing tobacco, and vaping devices, such as e-cigarettes. If you need help quitting, ask your health care provider. Stay away from secondhand smoke. Stay up to date on all immunizations, including the yearly (annual) flu vaccine. Keep all follow-up visits. This is important. How to prevent the spread of infection to others URIs can be contagious. To prevent the infection from spreading: Wash your hands with soap and water for at least 20 seconds. If soap and water are not available, use hand sanitizer. Avoid touching your mouth,  face, eyes, or nose. Cough or sneeze into a tissue or your sleeve or elbow instead of into your hand or into the air.  Contact a health care provider if: You are getting worse instead of better. You have a fever or chills. Your mucus is brown or red. You have  yellow or brown discharge coming from your nose. You have pain in your face, especially when you bend forward. You have swollen neck glands. You have pain while swallowing. You have white areas in the back of your throat. Get help right away if: You have shortness of breath that gets worse. You have severe or persistent: Headache. Ear pain. Sinus pain. Chest pain. You have chronic lung disease along with any of the following: Making high-pitched whistling sounds when you breathe, most often when you breathe out (wheezing). Prolonged cough (more than 14 days). Coughing up blood. A change in your usual mucus. You have a stiff neck. You have changes in your: Vision. Hearing. Thinking. Mood. These symptoms may be an emergency. Get help right away. Call 911. Do not wait to see if the symptoms will go away. Do not drive yourself to the hospital. Summary An upper respiratory infection (URI) is a common infection of the nose, throat, and upper air passages that lead to the lungs. A URI is caused by a virus. URIs usually get better on their own within 7-10 days. Medicines cannot cure URIs, but your health care provider may recommend certain medicines to help relieve symptoms.

## 2023-07-27 ENCOUNTER — Other Ambulatory Visit: Payer: Self-pay

## 2023-07-27 ENCOUNTER — Telehealth: Payer: Self-pay | Admitting: Hematology

## 2023-07-27 DIAGNOSIS — H40013 Open angle with borderline findings, low risk, bilateral: Secondary | ICD-10-CM | POA: Diagnosis not present

## 2023-07-27 DIAGNOSIS — H18513 Endothelial corneal dystrophy, bilateral: Secondary | ICD-10-CM | POA: Diagnosis not present

## 2023-07-27 DIAGNOSIS — H1013 Acute atopic conjunctivitis, bilateral: Secondary | ICD-10-CM | POA: Diagnosis not present

## 2023-07-27 DIAGNOSIS — F419 Anxiety disorder, unspecified: Secondary | ICD-10-CM

## 2023-07-27 DIAGNOSIS — H31013 Macula scars of posterior pole (postinflammatory) (post-traumatic), bilateral: Secondary | ICD-10-CM | POA: Diagnosis not present

## 2023-07-27 MED ORDER — HYDROXYZINE HCL 10 MG PO TABS: ORAL_TABLET | ORAL | 11 refills | Status: DC

## 2023-07-27 NOTE — Telephone Encounter (Signed)
Patient currently has pink eye; patient is aware of rescheduled appointment times/dates

## 2023-07-31 ENCOUNTER — Ambulatory Visit: Payer: Medicare Other | Admitting: Hematology

## 2023-07-31 ENCOUNTER — Other Ambulatory Visit: Payer: Medicare Other

## 2023-08-01 ENCOUNTER — Encounter: Payer: Self-pay | Admitting: Nurse Practitioner

## 2023-08-01 ENCOUNTER — Other Ambulatory Visit: Payer: Self-pay

## 2023-08-01 ENCOUNTER — Ambulatory Visit (INDEPENDENT_AMBULATORY_CARE_PROVIDER_SITE_OTHER): Payer: Medicare Other | Admitting: Nurse Practitioner

## 2023-08-01 VITALS — BP 155/60 | HR 57 | Temp 98.3°F | Ht 62.0 in | Wt 206.6 lb

## 2023-08-01 DIAGNOSIS — F419 Anxiety disorder, unspecified: Secondary | ICD-10-CM

## 2023-08-01 DIAGNOSIS — K2900 Acute gastritis without bleeding: Secondary | ICD-10-CM

## 2023-08-01 DIAGNOSIS — Z1152 Encounter for screening for COVID-19: Secondary | ICD-10-CM

## 2023-08-01 DIAGNOSIS — Z79899 Other long term (current) drug therapy: Secondary | ICD-10-CM | POA: Diagnosis not present

## 2023-08-01 DIAGNOSIS — E871 Hypo-osmolality and hyponatremia: Secondary | ICD-10-CM

## 2023-08-01 DIAGNOSIS — F19982 Other psychoactive substance use, unspecified with psychoactive substance-induced sleep disorder: Secondary | ICD-10-CM | POA: Diagnosis not present

## 2023-08-01 DIAGNOSIS — F13930 Sedative, hypnotic or anxiolytic use, unspecified with withdrawal, uncomplicated: Secondary | ICD-10-CM | POA: Diagnosis not present

## 2023-08-01 LAB — POC COVID19 BINAXNOW: SARS Coronavirus 2 Ag: NEGATIVE

## 2023-08-01 MED ORDER — LACTINEX PO CHEW
1.0000 | CHEWABLE_TABLET | Freq: Three times a day (TID) | ORAL | 0 refills | Status: AC
Start: 2023-08-01 — End: ?

## 2023-08-01 MED ORDER — BUSPIRONE HCL 5 MG PO TABS: ORAL_TABLET | ORAL | 0 refills | Status: DC

## 2023-08-01 MED ORDER — CLONIDINE HCL 0.1 MG PO TABS
ORAL_TABLET | ORAL | 0 refills | Status: DC
Start: 2023-08-01 — End: 2023-08-04

## 2023-08-01 NOTE — Progress Notes (Signed)
Assessment and Plan:  Rebecca Bond was seen today for an episodic visit.  Diagnoses and all order for this visit:  Encounter for screening for COVID-19  - POC COVID-19  Other acute gastritis without hemorrhage Start Probiotic as directed Stay well hydrated Reduce stress Contact office if s/s fail to improve  - lactobacillus acidophilus & bulgar (LACTINEX) chewable tablet; Chew 1 tablet by mouth 3 (three) times daily with meals.  Dispense: 90 tablet; Refill: 0 - COMPLETE METABOLIC PANEL WITH GFR  Benzodiazepine withdrawal without complication (HCC) Discussed use of Clonidine to help with withdrawal symptoms Contact office if s/s fail to improve.  Drug-induced insomnia (HCC) Start Buspar as directed. Discussed good sleep hygiene. Establish bed and wake times. Sleep restriction-only sleep estimated hrs sleep. Bed only for sleep, only sleep when sleepy, out of bed if anxious (stimulus control). Reviewed relaxation techniques, mindful meditations. Expected sleep duration. Addressed worries about not sleeping.   - busPIRone (BUSPAR) 5 MG tablet; Take     1/2 to 1 tablet     3 x /day      for Anxiety  Dispense: 90 tablet; Refill: 0  Anxiety Reviewed relaxation techniques.  Sleep hygiene. Recommended Cognitive Behavioral Therapy (CBT) PRN. Recommended mindfulness meditation and exercise.   Insight-oriented psychotherapy given for 16 minutes exclusively. Psychoeducation:  encouraged personality growth wand development through coping techniques and problem-solving skills. Limit/Decrease/Monitor drug/alcohol intake.    - busPIRone (BUSPAR) 5 MG tablet; Take     1/2 to 1 tablet     3 x /day      for Anxiety  Dispense: 90 tablet; Refill: 0  Medication management All medications discussed and reviewed in full. All questions and concerns regarding medications addressed.    - CBC with Differential/Platelet - Magnesium - busPIRone (BUSPAR) 5 MG tablet; Take     1/2 to 1 tablet      3 x /day      for Anxiety  Dispense: 90 tablet; Refill: 0 - lactobacillus acidophilus & bulgar (LACTINEX) chewable tablet; Chew 1 tablet by mouth 3 (three) times daily with meals.  Dispense: 90 tablet; Refill: 0 - COMPLETE METABOLIC PANEL WITH GFR  Hyponatremia  - CBC with Differential/Platelet  Hypomagnesemia  - Magnesium   Notify office for further evaluation and treatment, questions or concerns if any reported s/s fail to improve.   The patient was advised to call back or seek an in-person evaluation if any symptoms worsen or if the condition fails to improve as anticipated.   Further disposition pending results of labs. Discussed med's effects and SE's.    I discussed the assessment and treatment plan with the patient. The patient was provided an opportunity to ask questions and all were answered. The patient agreed with the plan and demonstrated an understanding of the instructions.  Discussed med's effects and SE's. Screening labs and tests as requested with regular follow-up as recommended.  I provided 30 minutes of face-to-face time during this encounter including counseling, chart review, and critical decision making was preformed.  Today's Plan of Care is based on a patient-centered health care approach known as shared decision making - the decisions, tests and treatments allow for patient preferences and values to be balanced with clinical evidence.     Future Appointments  Date Time Provider Department Center  08/10/2023  2:00 PM [facility] NURSE [facility] None  08/22/2023  1:00 PM Laurence Slate [facility]  08/25/2023  2:30 PM [facility] None  08/25/2023  3:00 PM  Johney Maine, MD [facility] None  08/31/2023  3:00 PM [name], MD [facility] None  12/05/2023  2:30 PM Raynelle Dick, NP [facility] None    ------------------------------------------------------------------------------------------------------------------   HPI BP  (!) 160/80   Pulse (!) 57   Temp 98.3 F (36.8 C)   Ht 5\' 2"  (1.575 m)   Wt 206 lb 9.6 oz (93.7 kg)   SpO2 99%   BMI 37.79 kg/m   77 y.o.female presents for evaluation of an overall feeling of being unwell.  She does report recently starting to wean from Xanax.  She is having night sweats and difficulty sleeping accompanied by anxiety.  Reports only getting approximately 3-4 hours of sleep at night.  Continues to have racing thoughts. Of note last review of blood work reveled low sodium and magnesium.  She denies any difficulty breathing, CP, syncope, palpitations.  Denies cough, fever, chills, N/V.    Past Medical History:  Diagnosis Date   Anemia    Anxiety    Arthritis    CLL (chronic lymphocytic leukemia) (HCC) dx'd ~ 2015   Depression    GERD (gastroesophageal reflux disease)    Headache    "sinus headaches"   History of kidney stones    Hypertension    OSA (obstructive sleep apnea)    Osteoarthritis    Pneumonia    Prediabetes    Recurrent sinus infections 08/02/2012   Tubular adenoma of colon      Allergies  Allergen Reactions   Hyzaar [Losartan Potassium-Hctz] Swelling and Other (See Comments)    "messed up my sodium counts"and swelled lips   Sudafed [Pseudoephedrine Hcl] Palpitations   Levaquin [Levofloxacin In D5w] Diarrhea and Nausea Only   Acyclovir And Related Other (See Comments)    Reaction not recalled   Biaxin [Clarithromycin]     GI Upset   Cantaloupe Extract Allergy Skin Test Other (See Comments)    Nasal congestion, headaches, and sneezing   Celexa [Citalopram] Other (See Comments)    Reaction not recalled   Flexeril [Cyclobenzaprine]     "zombie-like" feeling   Fosamax [Alendronate Sodium]     GI upset   Gabapentin     confusion   Iohexol Hives and Other (See Comments)    Patient stated she broke out in hive 20 yrs ago from IV contrast      Losartan Swelling and Other (See Comments)    Angioedema   Meloxicam     GI upset   Norvasc  [Amlodipine] Swelling and Other (See Comments)    Ankles swell   Other Nausea Only and Other (See Comments)    Iceburg lettuce- "made me dreadfully nauseous" Raw mushrooms only-  "made me dreadfully nauseous" (cooked ones are tolerated)   Pseudoephedrine     Palpitations   Zoloft [Sertraline Hcl] Other (See Comments)    Has no emotions at all     Current Outpatient Medications on File Prior to Visit  Medication Sig   Ascorbic Acid (VITAMIN C PO) 1500 mg by oral route.   bisoprolol-hydrochlorothiazide (ZIAC) 10-6.25 MG tablet Take  1 tablet every Morning for BP & Fluid Retention / Ankle Swelling  TAKE                                         BY                                                 MOUTH   buPROPion (WELLBUTRIN XL) 150 MG 24 hr tablet Take  1 tablet  every Morning for Mood / Depression, Focus  & Concentration   Cholecalciferol 50 MCG (2000 UT) TABS 10,000 units   FLUoxetine (PROZAC) 20 MG capsule Take 1 capsule Daily for Mood, Focus & Concentration   hydrOXYzine (ATARAX) 10 MG tablet Take 1 tablet 4 x / day for Anxiety   levothyroxine (SYNTHROID) 50 MCG tablet TAKE 1 TABLET(50 MCG) BY MOUTH DAILY   montelukast (SINGULAIR) 10 MG tablet Take 1 tablet daily for Allergies & Asthma   pregabalin (LYRICA) 50 MG capsule Take  1 tablet  3 x /day   as needed for painful burning pains   promethazine-dextromethorphan (PROMETHAZINE-DM) 6.25-15 MG/5ML syrup Take 1 tsp every 4 hours if needed for cough   RESTASIS 0.05 % ophthalmic emulsion Place 1 drop into both eyes 2 (two) times daily as needed (for seasonal allergies/irritation).    dexamethasone (DECADRON) 4 MG tablet Take 1 tab 3 x day - 3 days, then 2 x day - 3 days, then 1 tab daily   No current facility-administered medications on file prior to visit.    ROS: all negative except what is noted in the HPI.   Physical Exam:  BP (!) 160/80    Pulse (!) 57   Temp 98.3 F (36.8 C)   Ht 5\' 2"  (1.575 m)   Wt 206 lb 9.6 oz (93.7 kg)   SpO2 99%   BMI 37.79 kg/m   General Appearance: NAD.  Awake, conversant and cooperative. Eyes: PERRLA, EOMs intact.  Sclera white.  Conjunctiva without erythema. Sinuses: No frontal/maxillary tenderness.  No nasal discharge. Nares patent.  ENT/Mouth: Ext aud canals clear.  Bilateral TMs w/DOL and without erythema or bulging. Hearing intact.  Posterior pharynx without swelling or exudate.  Tonsils without swelling or erythema.  Neck: Supple.  No masses, nodules or thyromegaly. Respiratory: Effort is regular with non-labored breathing. Breath sounds are equal bilaterally without rales, rhonchi, wheezing or stridor.  Cardio: RRR with no MRGs. Brisk peripheral pulses without edema.  Abdomen: Active BS in all four quadrants.  Soft and non-tender without guarding, rebound tenderness, hernias or masses. Lymphatics: Non tender without lymphadenopathy.  Musculoskeletal: Full ROM, 5/5 strength, normal ambulation.  No clubbing or cyanosis. Skin: Appropriate color for ethnicity. Warm without rashes, lesions, ecchymosis, ulcers.  Neuro: CN II-XII grossly normal. Normal muscle tone without cerebellar symptoms and intact sensation.   Psych: AO X 3,  appropriate mood and affect, insight and judgment.     Adela Glimpse, NP 3:22 PM [facility]

## 2023-08-02 ENCOUNTER — Telehealth: Payer: Self-pay | Admitting: Nurse Practitioner

## 2023-08-02 ENCOUNTER — Other Ambulatory Visit: Payer: Self-pay | Admitting: Nurse Practitioner

## 2023-08-02 DIAGNOSIS — E871 Hypo-osmolality and hyponatremia: Secondary | ICD-10-CM

## 2023-08-02 DIAGNOSIS — N1831 Chronic kidney disease, stage 3a: Secondary | ICD-10-CM

## 2023-08-02 LAB — CBC WITH DIFFERENTIAL/PLATELET
Absolute Monocytes: 721 {cells}/uL (ref 200–950)
Basophils Absolute: 0 {cells}/uL (ref 0–200)
Basophils Relative: 0 %
Eosinophils Absolute: 144 {cells}/uL (ref 15–500)
Eosinophils Relative: 0.2 %
HCT: 34.2 % — ABNORMAL LOW (ref 35.0–45.0)
Hemoglobin: 11.1 g/dL — ABNORMAL LOW (ref 11.7–15.5)
Lymphs Abs: 63881 {cells}/uL — ABNORMAL HIGH (ref 850–3900)
MCH: 32.7 pg (ref 27.0–33.0)
MCHC: 32.5 g/dL (ref 32.0–36.0)
MCV: 100.9 fL — ABNORMAL HIGH (ref 80.0–100.0)
MPV: 8.6 fL (ref 7.5–12.5)
Monocytes Relative: 1 %
Neutro Abs: 7354 {cells}/uL (ref 1500–7800)
Neutrophils Relative %: 10.2 %
Platelets: 85 10*3/uL — ABNORMAL LOW (ref 140–400)
RBC: 3.39 10*6/uL — ABNORMAL LOW (ref 3.80–5.10)
RDW: 12.5 % (ref 11.0–15.0)
Total Lymphocyte: 88.6 %
WBC: 72.1 10*3/uL — ABNORMAL HIGH (ref 3.8–10.8)

## 2023-08-02 LAB — COMPLETE METABOLIC PANEL WITH GFR
AG Ratio: 2.6 (calc) — ABNORMAL HIGH (ref 1.0–2.5)
ALT: 28 U/L (ref 6–29)
AST: 30 U/L (ref 10–35)
Albumin: 4.4 g/dL (ref 3.6–5.1)
Alkaline phosphatase (APISO): 49 U/L (ref 37–153)
BUN/Creatinine Ratio: 22 (calc) (ref 6–22)
BUN: 23 mg/dL (ref 7–25)
CO2: 28 mmol/L (ref 20–32)
Calcium: 9.1 mg/dL (ref 8.6–10.4)
Chloride: 91 mmol/L — ABNORMAL LOW (ref 98–110)
Creat: 1.05 mg/dL — ABNORMAL HIGH (ref 0.60–1.00)
Globulin: 1.7 g/dL — ABNORMAL LOW (ref 1.9–3.7)
Glucose, Bld: 72 mg/dL (ref 65–99)
Potassium: 3.8 mmol/L (ref 3.5–5.3)
Sodium: 127 mmol/L — ABNORMAL LOW (ref 135–146)
Total Bilirubin: 1.1 mg/dL (ref 0.2–1.2)
Total Protein: 6.1 g/dL (ref 6.1–8.1)
eGFR: 55 mL/min/{1.73_m2} — ABNORMAL LOW (ref >=60)

## 2023-08-02 LAB — MAGNESIUM: Magnesium: 2 mg/dL (ref 1.5–2.5)

## 2023-08-02 NOTE — Telephone Encounter (Signed)
Pt said she is going to pick up her meds that were sent in yesterday. She was going to pick up a probiotic as well and was looking for recommendations.

## 2023-08-02 NOTE — Telephone Encounter (Signed)
Patient aware.

## 2023-08-03 ENCOUNTER — Telehealth: Payer: Self-pay | Admitting: Nurse Practitioner

## 2023-08-03 NOTE — Telephone Encounter (Signed)
Pt is not taking clonidine. She accidentally threw it away.

## 2023-08-04 ENCOUNTER — Other Ambulatory Visit: Payer: Self-pay | Admitting: Nurse Practitioner

## 2023-08-04 DIAGNOSIS — Z79899 Other long term (current) drug therapy: Secondary | ICD-10-CM

## 2023-08-04 DIAGNOSIS — F19982 Other psychoactive substance use, unspecified with psychoactive substance-induced sleep disorder: Secondary | ICD-10-CM

## 2023-08-04 MED ORDER — CLONIDINE HCL 0.1 MG PO TABS
ORAL_TABLET | ORAL | 0 refills | Status: DC
Start: 2023-08-04 — End: 2024-01-16

## 2023-08-04 NOTE — Telephone Encounter (Signed)
Just the Clonidine

## 2023-08-07 NOTE — Patient Instructions (Signed)
Benzodiazepine Withdrawal Benzodiazepine withdrawal is a group of physical and mental symptoms that happen when you suddenly stop taking benzodiazepines. Benzodiazepines are prescription medicines that decrease, or depress, the activity of the central nervous system and cause changes in certain brain chemicals (neurotransmitters). There are many types of benzodiazepines. Some benzodiazepines take effect quickly and stay in your body for a short amount of time (short-acting). Other benzodiazepines require more time to take effect and stay in your body for longer amounts of time (long-acting). The five most commonly prescribed benzodiazepines are: Alprazolam. Lorazepam. Clonazepam. Diazepam. Temazepam. What are the causes? When you take benzodiazepines, your brain needs more and more of the medicine over time to get the same effects from it. This increased need is called tolerance. As you develop a tolerance, your brain adapts to the effects of the benzodiazepine and relies on these effects. This is called dependency. Withdrawal happens when you suddenly stop taking your medicine. This does not give your brain enough time to adapt to not having the medicine. What increases the risk? You are more likely to develop this condition if: You have taken benzodiazepines for more than 1-2 weeks. You have developed a tolerance for benzodiazepines, or dependence on them. You take high dosages of benzodiazepines or doses that are higher than prescribed. You take benzodiazepines without a prescription. You use benzodiazepines with other substances that depress the central nervous system, such as alcohol. You have a history of drug or alcohol abuse. What are the signs or symptoms? Symptoms of this condition may include: Difficulty sleeping. Anxiety. Restlessness. Irritability. Muscle aches. Vomiting. Sweating. Other symptoms may include: Headaches. Involuntary shaking or trembling of a part of your body  (tremor). Confusion and poor concentration. Feeling or seeing things that are not there (hallucinations). Seizures. Symptoms of withdrawal from short-acting benzodiazepines may develop 1-2 days after you stop taking your medicine, and they may last for 2-4 weeks or longer. Symptoms of withdrawal from long-acting benzodiazepines may develop 2-7 days after you stop taking your medicine, and they may last for 2-8 weeks or longer. How is this diagnosed? This condition may be diagnosed based on: Your symptoms. Your medical history. A physical exam. Your health care provider may check for: Rapid heartbeat. Rapid breathing. Tremors. High blood pressure. Blood tests. Urine tests. Your alcohol and drug habits. How is this treated? Treatment usually involves giving you a safe and stable dose of a benzodiazepine and then slowly lowering your dosage over time (tapered withdrawal). This may be done at a hospital or a treatment center. Treatment for this condition depends on: Your symptoms. The type of benzodiazepine you have been taking. How long you have been taking benzodiazepines. Long-term treatment for this condition may include medicine, counseling, and support groups. Follow these instructions at home:  Take over-the-counter and prescription medicines only as told by your health care provider. Check with your health care provider before starting new medicines. How is this prevented? Do not take any benzodiazepines without a prescription. Do not take more than your prescribed dosage. Do not mix benzodiazepines with alcohol or other medicines. Do not stop taking benzodiazepines without speaking with your health care provider. Contact a health care provider if: You are not able to take your medicines as told by your health care provider. You have symptoms that get worse. You develop withdrawal symptoms during your tapered withdrawal. You develop a craving for drugs or alcohol. You start  taking increased amounts of benzodiazepines again (relapse). Get help right away if: You have  a seizure. You become very confused. You lose consciousness. You have difficulty breathing. These symptoms may be an emergency. Get help right away. Call 911. Do not wait to see if the symptoms will go away. Do not drive yourself to the hospital. Also, get help right away if: You have serious thoughts about hurting yourself or someone else. Take one of these steps if you feel like you may hurt yourself or others, or have thoughts about taking your own life: Go to your nearest emergency room. Call 911. Call the National Suicide Prevention Lifeline at 980-260-1358 or 988. This is open 24 hours a day. Text the Crisis Text Line at (978) 750-8851. Summary Withdrawal is a group of physical and mental symptoms that can happen when you suddenly stop taking a benzodiazepine. Treatment usually involves being given a safe and stable dose of a benzodiazepine and then slowly lowering the dosage over time. The type of treatment depends on your symptoms, how long you were taking the medicine, and which type of medicine you were taking. Long-term treatment may include medicine, counseling, and support groups. This information is not intended to replace advice given to you by your health care provider. Make sure you discuss any questions you have with your health care provider. Document Revised: 02/04/2022 Document Reviewed: 02/04/2022 Elsevier Patient Education  2024 [employer].

## 2023-08-10 ENCOUNTER — Ambulatory Visit (INDEPENDENT_AMBULATORY_CARE_PROVIDER_SITE_OTHER): Payer: Medicare Other

## 2023-08-10 ENCOUNTER — Encounter: Payer: Self-pay | Admitting: Nurse Practitioner

## 2023-08-10 ENCOUNTER — Ambulatory Visit (INDEPENDENT_AMBULATORY_CARE_PROVIDER_SITE_OTHER): Payer: Medicare Other | Admitting: Nurse Practitioner

## 2023-08-10 VITALS — BP 152/82 | HR 67 | Temp 97.5°F | Ht 62.0 in | Wt 202.0 lb

## 2023-08-10 DIAGNOSIS — E871 Hypo-osmolality and hyponatremia: Secondary | ICD-10-CM

## 2023-08-10 DIAGNOSIS — B027 Disseminated zoster: Secondary | ICD-10-CM | POA: Diagnosis not present

## 2023-08-10 DIAGNOSIS — N1831 Chronic kidney disease, stage 3a: Secondary | ICD-10-CM

## 2023-08-10 DIAGNOSIS — N3000 Acute cystitis without hematuria: Secondary | ICD-10-CM | POA: Diagnosis not present

## 2023-08-10 MED ORDER — FAMCICLOVIR 500 MG PO TABS
500.0000 mg | ORAL_TABLET | Freq: Two times a day (BID) | ORAL | 0 refills | Status: AC
Start: 2023-08-10 — End: ?

## 2023-08-10 NOTE — Patient Instructions (Addendum)

## 2023-08-10 NOTE — Progress Notes (Signed)
Patient presents today for a Nurse visit to recheck labs. While here she stated that the medications you gave her Hydroxyzine and Buspar are not helping her anxiety. She had a bad panic attack last night and she said the medications did not help at all. She has been taking them for 10 days. What would you like to do?

## 2023-08-10 NOTE — Progress Notes (Signed)
Assessment and Plan:  Rebecca Bond was seen today for an episodic visit.  Diagnoses and all order for this visit:  Disseminated herpes zoster Start famciclovir  Keep your rash clean and dry.  Do not use creams or gels. Try not to scratch your skin. Might help to cover it with a clean dressing. Wear loose clothing if this makes you more comfortable. Continue to monitor for increase in spreading of rash.   May be accompanied by flu like symptoms Contact office if s/s fail to improve.  Meds ordered this encounter  Medications   famciclovir (FAMVIR) 500 MG tablet    Sig: Take 1 tablet (500 mg total) by mouth 2 (two) times daily. 7 days    Dispense:  10 tablet    Refill:  0    Order Specific Question:   Supervising Provider    Answer:   [name] [phone]     Notify office for further evaluation and treatment, questions or concerns if s/s fail to improve. The risks and benefits of my recommendations, as well as other treatment options were discussed with the patient today. Questions were answered.  Further disposition pending results of labs. Discussed med's effects and SE's.    Over 15 minutes of exam, counseling, chart review, and critical decision making was performed.   Future Appointments  Date Time Provider Department Center  08/10/2023  3:15 PM Adela Glimpse, NP [facility] None  08/22/2023  1:00 PM Laurence Slate [facility]  08/25/2023  2:30 PM [facility] None  08/25/2023  3:00 PM Johney Maine, MD [facility] None  08/31/2023  3:00 PM [name], MD [facility] None  12/05/2023  2:30 PM Raynelle Dick, NP [facility] None    ------------------------------------------------------------------------------------------------------------------   HPI BP (!) 152/82   Pulse 67   Temp (!) 97.5 F (36.4 C)   Ht 5\' 2"  (1.575 m)   Wt 202 lb (91.6 kg)   SpO2 96%   BMI 36.95 kg/m   Patient reports to office today for a nurse visit  and informed staff that she felt as though she had developed a shingles rash on her abdomen.  Present x 3 days.  Has had shingles in the past. Denies flu like symptoms, fever, chills, N/V.    She does report a hx of gastritis and intolerance to medications.  No true allergy to anti-viral stated.    Past Medical History:  Diagnosis Date   Anemia    Anxiety    Arthritis    CLL (chronic lymphocytic leukemia) (HCC) dx'd ~ 2015   Depression    GERD (gastroesophageal reflux disease)    Headache    "sinus headaches"   History of kidney stones    Hypertension    OSA (obstructive sleep apnea)    Osteoarthritis    Pneumonia    Prediabetes    Recurrent sinus infections 08/02/2012   Tubular adenoma of colon      Allergies  Allergen Reactions   Hyzaar [Losartan Potassium-Hctz] Swelling and Other (See Comments)    "messed up my sodium counts"and swelled lips   Sudafed [Pseudoephedrine Hcl] Palpitations   Levaquin [Levofloxacin In D5w] Diarrhea and Nausea Only   Acyclovir And Related Other (See Comments)    Reaction not recalled   Biaxin [Clarithromycin]     GI Upset   Cantaloupe Extract Allergy Skin Test Other (See Comments)    Nasal congestion, headaches, and sneezing   Celexa [Citalopram] Other (See Comments)    Reaction  not recalled   Flexeril [Cyclobenzaprine]     "zombie-like" feeling   Fosamax [Alendronate Sodium]     GI upset   Gabapentin     confusion   Iohexol Hives and Other (See Comments)    Patient stated she broke out in hive 20 yrs ago from IV contrast      Losartan Swelling and Other (See Comments)    Angioedema   Meloxicam     GI upset   Norvasc [Amlodipine] Swelling and Other (See Comments)    Ankles swell   Other Nausea Only and Other (See Comments)    Iceburg lettuce- "made me dreadfully nauseous" Raw mushrooms only-  "made me dreadfully nauseous" (cooked ones are tolerated)   Pseudoephedrine     Palpitations   Zoloft [Sertraline Hcl] Other (See Comments)     Has no emotions at all     Current Outpatient Medications on File Prior to Visit  Medication Sig   Ascorbic Acid (VITAMIN C PO) 1500 mg by oral route.   bisoprolol-hydrochlorothiazide (ZIAC) 10-6.25 MG tablet Take  1 tablet every Morning for BP & Fluid Retention / Ankle Swelling                                                                                                TAKE                                         BY                                                 MOUTH   buPROPion (WELLBUTRIN XL) 150 MG 24 hr tablet Take  1 tablet  every Morning for Mood / Depression, Focus  & Concentration   busPIRone (BUSPAR) 5 MG tablet Take     1/2 to 1 tablet     3 x /day      for Anxiety   Cholecalciferol 50 MCG (2000 UT) TABS 10,000 units   cloNIDine (CATAPRES) 0.1 MG tablet Take 1-2 tables PO; May repeart every 45-60 mins PRN up to a total of 4 doses until symptoms resolve.  Monitor BP   dexamethasone (DECADRON) 4 MG tablet Take 1 tab 3 x day - 3 days, then 2 x day - 3 days, then 1 tab daily   FLUoxetine (PROZAC) 20 MG capsule Take 1 capsule Daily for Mood, Focus & Concentration   hydrOXYzine (ATARAX) 10 MG tablet Take 1 tablet 4 x / day for Anxiety   lactobacillus acidophilus & bulgar (LACTINEX) chewable tablet Chew 1 tablet by mouth 3 (three) times daily with meals.   levothyroxine (SYNTHROID) 50 MCG tablet TAKE 1 TABLET(50 MCG) BY MOUTH DAILY   montelukast (SINGULAIR) 10 MG tablet Take 1 tablet daily for Allergies & Asthma   pregabalin (LYRICA) 50 MG capsule Take  1 tablet  3 x /day  as needed for painful burning pains   promethazine-dextromethorphan (PROMETHAZINE-DM) 6.25-15 MG/5ML syrup Take 1 tsp every 4 hours if needed for cough   RESTASIS 0.05 % ophthalmic emulsion Place 1 drop into both eyes 2 (two) times daily as needed (for seasonal allergies/irritation).    No current facility-administered medications on file prior to visit.    ROS: all negative except what is noted in the HPI.    Physical Exam:  BP (!) 152/82   Pulse 67   Temp (!) 97.5 F (36.4 C)   Ht 5\' 2"  (1.575 m)   Wt 202 lb (91.6 kg)   SpO2 96%   BMI 36.95 kg/m   General Appearance: NAD.  Awake, conversant and cooperative. Eyes: PERRLA, EOMs intact.  Sclera white.  Conjunctiva without erythema. Sinuses: No frontal/maxillary tenderness.  No nasal discharge. Nares patent.  ENT/Mouth: Ext aud canals clear.  Bilateral TMs w/DOL and without erythema or bulging. Hearing intact.  Posterior pharynx without swelling or exudate.  Tonsils without swelling or erythema.  Neck: Supple.  No masses, nodules or thyromegaly. Respiratory: Effort is regular with non-labored breathing. Breath sounds are equal bilaterally without rales, rhonchi, wheezing or stridor.  Cardio: RRR with no MRGs. Brisk peripheral pulses without edema.  Abdomen: Active BS in all four quadrants.  Soft and non-tender without guarding, rebound tenderness, hernias or masses. Lymphatics: Non tender without lymphadenopathy.  Musculoskeletal: Full ROM, 5/5 strength, normal ambulation.  No clubbing or cyanosis. Skin: Scattered vesicle type rash with underlying erythema that is present along LUQ in dermatomal pattern.  Warm.  Appropriate color for ethnicity. Appropriate color for ethnicity. Warm without rashes, lesions, ecchymosis, ulcers.  Neuro: CN II-XII grossly normal. Normal muscle tone without cerebellar symptoms and intact sensation.   Psych: AO X 3,  appropriate mood and affect, insight and judgment.     Adela Glimpse, NP 2:52 PM [facility]

## 2023-08-13 LAB — URINALYSIS, ROUTINE W REFLEX MICROSCOPIC
Bilirubin Urine: NEGATIVE
Glucose, UA: NEGATIVE
Hgb urine dipstick: NEGATIVE
Hyaline Cast: NONE SEEN /LPF
Ketones, ur: NEGATIVE
Nitrite: NEGATIVE
Protein, ur: NEGATIVE
RBC / HPF: NONE SEEN /HPF (ref 0–2)
Specific Gravity, Urine: 1.013 (ref 1.001–1.035)
Squamous Epithelial / HPF: NONE SEEN /HPF (ref <=5)
pH: 6.5 (ref 5.0–8.0)

## 2023-08-13 LAB — COMPLETE METABOLIC PANEL WITH GFR
AG Ratio: 2.5 (calc) (ref 1.0–2.5)
ALT: 18 U/L (ref 6–29)
AST: 20 U/L (ref 10–35)
Albumin: 4.5 g/dL (ref 3.6–5.1)
Alkaline phosphatase (APISO): 53 U/L (ref 37–153)
BUN: 11 mg/dL (ref 7–25)
CO2: 23 mmol/L (ref 20–32)
Calcium: 9.7 mg/dL (ref 8.6–10.4)
Chloride: 90 mmol/L — ABNORMAL LOW (ref 98–110)
Creat: 0.9 mg/dL (ref 0.60–1.00)
Globulin: 1.8 g/dL — ABNORMAL LOW (ref 1.9–3.7)
Glucose, Bld: 103 mg/dL — ABNORMAL HIGH (ref 65–99)
Potassium: 3.9 mmol/L (ref 3.5–5.3)
Sodium: 125 mmol/L — ABNORMAL LOW (ref 135–146)
Total Bilirubin: 0.9 mg/dL (ref 0.2–1.2)
Total Protein: 6.3 g/dL (ref 6.1–8.1)
eGFR: 66 mL/min/{1.73_m2} (ref >=60)

## 2023-08-13 LAB — URINE CULTURE
MICRO NUMBER:: 15426852
SPECIMEN QUALITY:: ADEQUATE

## 2023-08-13 LAB — MICROSCOPIC MESSAGE

## 2023-08-14 ENCOUNTER — Other Ambulatory Visit: Payer: Self-pay | Admitting: Nurse Practitioner

## 2023-08-14 MED ORDER — CIPROFLOXACIN HCL 250 MG PO TABS: ORAL_TABLET | ORAL | 0 refills | Status: DC

## 2023-08-17 ENCOUNTER — Other Ambulatory Visit: Payer: Self-pay | Admitting: Nurse Practitioner

## 2023-08-17 ENCOUNTER — Telehealth: Payer: Self-pay | Admitting: Nurse Practitioner

## 2023-08-17 MED ORDER — AMOXICILLIN-POT CLAVULANATE 500-125 MG PO TABS: 1.0000 | ORAL_TABLET | Freq: Two times a day (BID) | ORAL | 0 refills | Status: DC

## 2023-08-17 NOTE — Telephone Encounter (Signed)
Patient states that she cannot take Cipro and that you were going to call her in a different antibiotic to [facility] on [address] but I don't show where anything else was sent in to replace the Cipro.[name] KitchenMarland Kitchen

## 2023-08-21 ENCOUNTER — Telehealth: Payer: Self-pay | Admitting: Hematology

## 2023-08-22 ENCOUNTER — Ambulatory Visit: Payer: Medicare Other | Admitting: Licensed Clinical Social Worker

## 2023-08-23 ENCOUNTER — Other Ambulatory Visit: Payer: Self-pay | Admitting: Nurse Practitioner

## 2023-08-23 ENCOUNTER — Telehealth: Payer: Self-pay | Admitting: Nurse Practitioner

## 2023-08-23 MED ORDER — BUSPIRONE HCL 10 MG PO TABS: 10.0000 mg | ORAL_TABLET | Freq: Three times a day (TID) | ORAL | 1 refills | Status: DC

## 2023-08-23 NOTE — Telephone Encounter (Signed)
Was originally taking Buspirone 1 tablet 3 times a day but when she told you that it wasn't enough, she states that you told her she could increase to 2 tablets 3 times a day. She has been doing that and it is working, but she has ran out of her medication early since she increased her dosing. Will you send in a new rx to reflect current dosing to [facility] on [address]?

## 2023-08-24 ENCOUNTER — Encounter: Payer: Medicare Other | Admitting: Internal Medicine

## 2023-08-25 ENCOUNTER — Ambulatory Visit: Payer: Medicare Other | Admitting: Hematology

## 2023-08-25 ENCOUNTER — Other Ambulatory Visit: Payer: Medicare Other

## 2023-08-31 ENCOUNTER — Ambulatory Visit: Payer: Medicare Other | Admitting: Internal Medicine

## 2023-08-31 ENCOUNTER — Encounter: Payer: Self-pay | Admitting: Internal Medicine

## 2023-08-31 VITALS — BP 138/76 | HR 62 | Temp 97.9°F | Resp 16 | Ht 62.0 in | Wt 199.0 lb

## 2023-08-31 DIAGNOSIS — Z136 Encounter for screening for cardiovascular disorders: Secondary | ICD-10-CM | POA: Diagnosis not present

## 2023-08-31 DIAGNOSIS — E782 Mixed hyperlipidemia: Secondary | ICD-10-CM

## 2023-08-31 DIAGNOSIS — I700 Atherosclerosis of aorta: Secondary | ICD-10-CM

## 2023-08-31 DIAGNOSIS — Z8249 Family history of ischemic heart disease and other diseases of the circulatory system: Secondary | ICD-10-CM

## 2023-08-31 DIAGNOSIS — Z79899 Other long term (current) drug therapy: Secondary | ICD-10-CM

## 2023-08-31 DIAGNOSIS — Z1211 Encounter for screening for malignant neoplasm of colon: Secondary | ICD-10-CM

## 2023-08-31 DIAGNOSIS — Z0000 Encounter for general adult medical examination without abnormal findings: Secondary | ICD-10-CM

## 2023-08-31 DIAGNOSIS — E559 Vitamin D deficiency, unspecified: Secondary | ICD-10-CM

## 2023-08-31 DIAGNOSIS — G720 Drug-induced myopathy: Secondary | ICD-10-CM

## 2023-08-31 DIAGNOSIS — F325 Major depressive disorder, single episode, in full remission: Secondary | ICD-10-CM

## 2023-08-31 DIAGNOSIS — G4733 Obstructive sleep apnea (adult) (pediatric): Secondary | ICD-10-CM

## 2023-08-31 DIAGNOSIS — N1831 Chronic kidney disease, stage 3a: Secondary | ICD-10-CM

## 2023-08-31 DIAGNOSIS — C9110 Chronic lymphocytic leukemia of B-cell type not having achieved remission: Secondary | ICD-10-CM

## 2023-08-31 DIAGNOSIS — I10 Essential (primary) hypertension: Secondary | ICD-10-CM | POA: Diagnosis not present

## 2023-08-31 DIAGNOSIS — R7309 Other abnormal glucose: Secondary | ICD-10-CM

## 2023-08-31 DIAGNOSIS — Z23 Encounter for immunization: Secondary | ICD-10-CM | POA: Diagnosis not present

## 2023-08-31 DIAGNOSIS — Z87891 Personal history of nicotine dependence: Secondary | ICD-10-CM

## 2023-08-31 DIAGNOSIS — K219 Gastro-esophageal reflux disease without esophagitis: Secondary | ICD-10-CM

## 2023-08-31 DIAGNOSIS — Z0001 Encounter for general adult medical examination with abnormal findings: Secondary | ICD-10-CM

## 2023-08-31 LAB — CBC WITH DIFFERENTIAL/PLATELET
Absolute Monocytes: 572 cells/uL (ref 200–950)
Basophils Absolute: 38 cells/uL (ref 0–200)
Basophils Relative: 0.1 %
Eosinophils Absolute: 152 cells/uL (ref 15–500)
Eosinophils Relative: 0.4 %
HCT: 32.6 % — ABNORMAL LOW (ref 35.0–45.0)
Hemoglobin: 11 g/dL — ABNORMAL LOW (ref 11.7–15.5)
Lymphs Abs: 30975 cells/uL — ABNORMAL HIGH (ref 850–3900)
MCH: 31.8 pg (ref 27.0–33.0)
MCHC: 33.7 g/dL (ref 32.0–36.0)
MCV: 94.2 fL (ref 80.0–100.0)
MPV: 8.6 fL (ref 7.5–12.5)
Monocytes Relative: 1.5 %
Neutro Abs: 6363 cells/uL (ref 1500–7800)
Neutrophils Relative %: 16.7 %
Platelets: 133 10*3/uL — ABNORMAL LOW (ref 140–400)
RBC: 3.46 10*6/uL — ABNORMAL LOW (ref 3.80–5.10)
RDW: 12.1 % (ref 11.0–15.0)
Total Lymphocyte: 81.3 %
WBC: 38.1 10*3/uL — ABNORMAL HIGH (ref 3.8–10.8)

## 2023-08-31 MED ORDER — PANTOPRAZOLE SODIUM 40 MG PO TBEC: DELAYED_RELEASE_TABLET | ORAL | 3 refills | Status: DC

## 2023-08-31 NOTE — Patient Instructions (Signed)
Due to recent changes in healthcare laws, you may see the results of your imaging and laboratory studies on MyChart before your provider has had a chance to review them.  We understand that in some cases there may be results that are confusing or concerning to you. Not all laboratory results come back in the same time frame and the provider may be waiting for multiple results in order to interpret others.  Please give [place] 48 hours in order for your provider to thoroughly review all the results before contacting the office for clarification of your results.   +++++++++++++++++++++++++  Vit D  & Vit C 1,000 mg   are recommended to help protect  against the Covid-19 and other Corona viruses.    Also it's recommended  to take  Zinc 50 mg  to help  protect against the Covid-19   and best place to get  is also on [email or web address]  and don't pay more than 6-8 cents /pill !  ================================ Coronavirus (COVID-19) Are you at risk?  Are you at risk for the Coronavirus (COVID-19)?  To be considered HIGH RISK for Coronavirus (COVID-19), you have to meet the following criteria:  Traveled to [place], [place], [place], [place] or [place]; or in the [place] to [place], [place], [place]  or [place]; and have fever, cough, and shortness of breath within the last 2 weeks of travel OR Been in close contact with a person diagnosed with COVID-19 within the last 2 weeks and have  fever, cough,and shortness of breath  IF YOU DO NOT MEET THESE CRITERIA, YOU ARE CONSIDERED LOW RISK FOR COVID-19.  What to do if you are HIGH RISK for COVID-19?  If you are having a medical emergency, call 911. Seek medical care right away. Before you go to a doctor's office, urgent care or emergency department,  call ahead and tell them about your recent travel, contact with someone diagnosed with COVID-19   and your symptoms.  You should receive instructions from your physician's office regarding next  steps of care.  When you arrive at healthcare provider, tell the healthcare staff immediately you have returned from  visiting [place], [place], [place], [place] or [place]; or traveled in the [place] to [place], [place],  [place] or [place] in the last two weeks or you have been in close contact with a person diagnosed with  COVID-19 in the last 2 weeks.   Tell the health care staff about your symptoms: fever, cough and shortness of breath. After you have been seen by a medical provider, you will be either: Tested for (COVID-19) and discharged home on quarantine except to seek medical care if  symptoms worsen, and asked to  Stay home and avoid contact with others until you get your results (4-5 days)  Avoid travel on public transportation if possible (such as bus, train, or airplane) or Sent to the Emergency Department by EMS for evaluation, COVID-19 testing  and  possible admission depending on your condition and test results.  What to do if you are LOW RISK for COVID-19?  Reduce your risk of any infection by using the same precautions used for avoiding the common cold or flu:  Wash your hands often with soap and warm water for at least 20 seconds.  If soap and water are not readily available,  use an alcohol-based hand sanitizer with at least 60% alcohol.  If coughing or sneezing, cover your mouth and nose by coughing  or sneezing into the elbow areas of your shirt or coat,  into a tissue or into your sleeve (not your hands). Avoid shaking hands with others and consider head nods or verbal greetings only. Avoid touching your eyes, nose, or mouth with unwashed hands.  Avoid close contact with people who are sick. Avoid places or events with large numbers of people in one location, like concerts or sporting events. Carefully consider travel plans you have or are making. If you are planning any travel outside or inside the [place], visit the CDC's Travelers' Health webpage for the  latest health notices. If you have some symptoms but not all symptoms, continue to monitor at home and seek medical attention  if your symptoms worsen. If you are having a medical emergency, call 911.   >>>>>>>>>>>>>>>>>>>>>>>>>>>>>>>>>  We Do NOT Approve of [employer] > > > > > > > > > > > > > > > > > > > > > > > > > > > > > > > > > > > > > > >  Preventive Care for Adults  A healthy lifestyle and preventive care can promote health and wellness. Preventive health guidelines for women include the following key practices. A routine yearly physical is a good way to check with your health care provider about your health and preventive screening. It is a chance to share any concerns and updates on your health and to receive a thorough exam. Visit your dentist for a routine exam and preventive care every 6 months. Brush your teeth twice a day and floss once a day. Good oral hygiene prevents tooth decay and gum disease. The frequency of eye exams is based on your age, health, family medical history, use of contact lenses, and other factors. Follow your health care provider's recommendations for frequency of eye exams. Eat a healthy diet. Foods like vegetables, fruits, whole grains, low-fat dairy products, and lean protein foods contain the nutrients you need without too many calories. Decrease your intake of foods high in solid fats, added sugars, and salt. Eat the right amount of calories for you. Get information about a proper diet from your health care provider, if necessary. Regular physical exercise is one of the most important things you can do for your health. Most adults should get at least 150 minutes of moderate-intensity exercise (any activity that increases your heart rate and causes you to sweat) each week. In addition, most adults need muscle-strengthening exercises on 2 or more days a week. Maintain a healthy weight. The body mass index (BMI) is a screening tool to identify  possible weight problems. It provides an estimate of body fat based on height and weight. Your health care provider can find your BMI and can help you achieve or maintain a healthy weight. For adults 20 years and older: A BMI below 18.5 is considered underweight. A BMI of 18.5 to 24.9 is normal. A BMI of 25 to 29.9 is considered overweight. A BMI of 30 and above is considered obese. Maintain normal blood lipids and cholesterol levels by exercising and minimizing your intake of saturated fat. Eat a balanced diet with plenty of fruit and vegetables. If your lipid or cholesterol levels are high, you are over 50, or you are at high risk for heart disease, you may need your cholesterol levels checked more frequently. Ongoing high lipid and cholesterol levels should be treated with medicines if diet and exercise are not working. If you smoke, find out from  your health care provider how to quit. If you do not use tobacco, do not start. Lung cancer screening is recommended for adults aged 55-80 years who are at high risk for developing lung cancer because of a history of smoking. A yearly low-dose CT scan of the lungs is recommended for people who have at least a 30-pack-year history of smoking and are a current smoker or have quit within the past 15 years. A pack year of smoking is smoking an average of 1 pack of cigarettes a day for 1 year (for example: 1 pack a day for 30 years or 2 packs a day for 15 years). Yearly screening should continue until the smoker has stopped smoking for at least 15 years. Yearly screening should be stopped for people who develop a health problem that would prevent them from having lung cancer treatment. Avoid use of street drugs. Do not share needles with anyone. Ask for help if you need support or instructions about stopping the use of drugs. High blood pressure causes heart disease and increases the risk of stroke.  Ongoing high blood pressure should be treated with medicines if  weight loss and exercise do not work. If you are 52-34 years old, ask your health care provider if you should take aspirin to prevent strokes. Diabetes screening involves taking a blood sample to check your fasting blood sugar level. This should be done once every 3 years, after age 39, if you are within normal weight and without risk factors for diabetes. Testing should be considered at a younger age or be carried out more frequently if you are overweight and have at least 1 risk factor for diabetes. Breast cancer screening is essential preventive care for women. You should practice "breast self-awareness." This means understanding the normal appearance and feel of your breasts and may include breast self-examination. Any changes detected, no matter how small, should be reported to a health care provider. Women in their 48s and 30s should have a clinical breast exam (CBE) by a health care provider as part of a regular health exam every 1 to 3 years. After age 9, women should have a CBE every year. Starting at age 18, women should consider having a mammogram (breast X-ray test) every year. Women who have a family history of breast cancer should talk to their health care provider about genetic screening. Women at a high risk of breast cancer should talk to their health care providers about having an MRI and a mammogram every year. Breast cancer gene (BRCA)-related cancer risk assessment is recommended for women who have family members with BRCA-related cancers. BRCA-related cancers include breast, ovarian, tubal, and peritoneal cancers. Having family members with these cancers may be associated with an increased risk for harmful changes (mutations) in the breast cancer genes BRCA1 and BRCA2. Results of the assessment will determine the need for genetic counseling and BRCA1 and BRCA2 testing. Routine pelvic exams to screen for cancer are no longer recommended for nonpregnant women who are considered low risk for  cancer of the pelvic organs (ovaries, uterus, and vagina) and who do not have symptoms. Ask your health care provider if a screening pelvic exam is right for you. If you have had past treatment for cervical cancer or a condition that could lead to cancer, you need Pap tests and screening for cancer for at least 20 years after your treatment. If Pap tests have been discontinued, your risk factors (such as having a new sexual partner) need to be  reassessed to determine if screening should be resumed. Some women have medical problems that increase the chance of getting cervical cancer. In these cases, your health care provider may recommend more frequent screening and Pap tests.  Colorectal cancer can be detected and often prevented. Most routine colorectal cancer screening begins at the age of 73 years and continues through age 103 years. However, your health care provider may recommend screening at an earlier age if you have risk factors for colon cancer. On a yearly basis, your health care provider may provide home test kits to check for hidden blood in the stool. Use of a small camera at the end of a tube, to directly examine the colon (sigmoidoscopy or colonoscopy), can detect the earliest forms of colorectal cancer. Talk to your health care provider about this at age 5, when routine screening begins.  Direct exam of the colon should be repeated every 5-10 years through age 50 years, unless early forms of pre-cancerous polyps or small growths are found. Osteoporosis is a disease in which the bones lose minerals and strength with aging. This can result in serious bone fractures or breaks. The risk of osteoporosis can be identified using a bone density scan. Women ages 52 years and over and women at risk for fractures or osteoporosis should discuss screening with their health care providers. Ask your health care provider whether you should take a calcium supplement or vitamin D to reduce the rate of  osteoporosis. Menopause can be associated with physical symptoms and risks. Hormone replacement therapy is available to decrease symptoms and risks. You should talk to your health care provider about whether hormone replacement therapy is right for you. Use sunscreen. Apply sunscreen liberally and repeatedly throughout the day. You should seek shade when your shadow is shorter than you. Protect yourself by wearing long sleeves, pants, a wide-brimmed hat, and sunglasses year round, whenever you are outdoors. Once a month, do a whole body skin exam, using a mirror to look at the skin on your back. Tell your health care provider of new moles, moles that have irregular borders, moles that are larger than a pencil eraser, or moles that have changed in shape or color. Stay current with required vaccines (immunizations). Influenza vaccine. All adults should be immunized every year. Tetanus, diphtheria, and acellular pertussis (Td, Tdap) vaccine. Pregnant women should receive 1 dose of Tdap vaccine during each pregnancy. The dose should be obtained regardless of the length of time since the last dose. Immunization is preferred during the 27th-36th week of gestation. An adult who has not previously received Tdap or who does not know her vaccine status should receive 1 dose of Tdap. This initial dose should be followed by tetanus and diphtheria toxoids (Td) booster doses every 10 years. Adults with an unknown or incomplete history of completing a 3-dose immunization series with Td-containing vaccines should begin or complete a primary immunization series including a Tdap dose. Adults should receive a Td booster every 10 years.  Zoster vaccine. One dose is recommended for adults aged 42 years or older unless certain conditions are present.  Pneumococcal 13-valent conjugate (PCV13) vaccine. When indicated, a person who is uncertain of her immunization history and has no record of immunization should receive the PCV13  vaccine. An adult aged 20 years or older who has certain medical conditions and has not been previously immunized should receive 1 dose of PCV13 vaccine. This PCV13 should be followed with a dose of pneumococcal polysaccharide (PPSV23) vaccine. The PPSV23  vaccine dose should be obtained at least 1 or more year(s) after the dose of PCV13 vaccine. An adult aged 33 years or older who has certain medical conditions and previously received 1 or more doses of PPSV23 vaccine should receive 1 dose of PCV13. The PCV13 vaccine dose should be obtained 1 or more years after the last PPSV23 vaccine dose.  Pneumococcal polysaccharide (PPSV23) vaccine. When PCV13 is also indicated, PCV13 should be obtained first. All adults aged 53 years and older should be immunized. An adult younger than age 71 years who has certain medical conditions should be immunized. Any person who resides in a nursing home or long-term care facility should be immunized. An adult smoker should be immunized. People with an immunocompromised condition and certain other conditions should receive both PCV13 and PPSV23 vaccines. People with human immunodeficiency virus (HIV) infection should be immunized as soon as possible after diagnosis. Immunization during chemotherapy or radiation therapy should be avoided. Routine use of PPSV23 vaccine is not recommended for American Indians, [address], or people younger than 65 years unless there are medical conditions that require PPSV23 vaccine. When indicated, people who have unknown immunization and have no record of immunization should receive PPSV23 vaccine. One-time revaccination 5 years after the first dose of PPSV23 is recommended for people aged 19-64 years who have chronic kidney failure, nephrotic syndrome, asplenia, or immunocompromised conditions. People who received 1-2 doses of PPSV23 before age 52 years should receive another dose of PPSV23 vaccine at age 75 years or later if at least 5 years have  passed since the previous dose. Doses of PPSV23 are not needed for people immunized with PPSV23 at or after age 59 years.  Preventive Services / Frequency  Ages 65 years and over Blood pressure check. Lipid and cholesterol check. Lung cancer screening. / Every year if you are aged 55-80 years and have a 30-pack-year history of smoking and currently smoke or have quit within the past 15 years. Yearly screening is stopped once you have quit smoking for at least 15 years or develop a health problem that would prevent you from having lung cancer treatment. Clinical breast exam.** / Every year after age 22 years.  BRCA-related cancer risk assessment.** / For women who have family members with a BRCA-related cancer (breast, ovarian, tubal, or peritoneal cancers). Mammogram.** / Every year beginning at age 105 years and continuing for as long as you are in good health. Consult with your health care provider. Pap test.** / Every 3 years starting at age 88 years through age 44 or 60 years with 3 consecutive normal Pap tests. Testing can be stopped between 65 and 70 years with 3 consecutive normal Pap tests and no abnormal Pap or HPV tests in the past 10 years. Fecal occult blood test (FOBT) of stool. / Every year beginning at age 33 years and continuing until age 18 years. You may not need to do this test if you get a colonoscopy every 10 years. Flexible sigmoidoscopy or colonoscopy.** / Every 5 years for a flexible sigmoidoscopy or every 10 years for a colonoscopy beginning at age 77 years and continuing until age 74 years. Hepatitis C blood test.** / For all people born from [birth year] through 1965 and any individual with known risks for hepatitis C. Osteoporosis screening.** / A one-time screening for women ages 53 years and over and women at risk for fractures or osteoporosis. Skin self-exam. / Monthly. Influenza vaccine. / Every year. Tetanus, diphtheria, and acellular pertussis (Tdap/Td) vaccine.** /  1 dose  of Td every 10 years. Zoster vaccine.** / 1 dose for adults aged 51 years or older. Pneumococcal 13-valent conjugate (PCV13) vaccine.** / Consult your health care provider. Pneumococcal polysaccharide (PPSV23) vaccine.** / 1 dose for all adults aged 5 years and older. Screening for abdominal aortic aneurysm (AAA)  by ultrasound is recommended for people who have history of high blood pressure or who are current or former smokers. ++++++++++++++++++++ Recommend Adult Low Dose Aspirin or  coated  Aspirin 81 mg daily  To reduce risk of Colon Cancer 40 %,  Skin Cancer 26 % ,  Melanoma 46%  and  Pancreatic cancer 60% ++++++++++++++++++++ Vitamin D goal  is between 70-100.  Please make sure that you are taking your Vitamin D as directed.  It is very important as a natural anti-inflammatory  helping hair, skin, and nails, as well as reducing stroke and heart attack risk.  It helps your bones and helps with mood. It also decreases numerous cancer risks so please take it as directed.  Low Vit D is associated with a 200-300% higher risk for CANCER  and 200-300% higher risk for HEART   ATTACK  &  STROKE.   .....................................[name] Kitchen It is also associated with higher death rate at younger ages,  autoimmune diseases like Rheumatoid arthritis, Lupus, Multiple Sclerosis.    Also many other serious conditions, like depression, Alzheimer's Dementia, infertility, muscle aches, fatigue, fibromyalgia - just to name a few. ++++++++++++++++++ Recommend the book "The END of DIETING" by Dr Monico Hoar  & the book "The END of DIABETES " by Dr Monico Hoar At [email or web address] - get book & Audio CD's    Being diabetic has a  300% increased risk for heart attack, stroke, cancer, and alzheimer- type vascular dementia. It is very important that you work harder with diet by avoiding all foods that are white. Avoid white rice (brown & wild rice is OK), white potatoes (sweetpotatoes in moderation is OK),  White bread or wheat bread or anything made out of white flour like bagels, donuts, rolls, buns, biscuits, cakes, pastries, cookies, pizza crust, and pasta (made from white flour & egg whites) - vegetarian pasta or spinach or wheat pasta is OK. Multigrain breads like Arnold's or [employer], or multigrain sandwich thins or flatbreads.  Diet, exercise and weight loss can reverse and cure diabetes in the early stages.  Diet, exercise and weight loss is very important in the control and prevention of complications of diabetes which affects every system in your body, ie. Brain - dementia/stroke, eyes - glaucoma/blindness, heart - heart attack/heart failure, kidneys - dialysis, stomach - gastric paralysis, intestines - malabsorption, nerves - severe painful neuritis, circulation - gangrene & loss of a leg(s), and finally cancer and Alzheimers.    I recommend avoid fried & greasy foods,  sweets/candy, white rice (brown or wild rice or Quinoa is OK), white potatoes (sweet potatoes are OK) - anything made from white flour - bagels, doughnuts, rolls, buns, biscuits,white and wheat breads, pizza crust and traditional pasta made of white flour & egg white(vegetarian pasta or spinach or wheat pasta is OK).  Multi-grain bread is OK - like multi-grain flat bread or sandwich thins. Avoid alcohol in excess. Exercise is also important.    Eat all the vegetables you want - avoid meat, especially red meat and dairy - especially cheese.  Cheese is the most concentrated form of trans-fats which is the worst thing to clog up our arteries. Veggie cheese is OK  which can be found in the fresh produce section at [facility] or [employer] or [employer]  +++++++++++++++++++ DASH Eating Plan  DASH stands for "Dietary Approaches to Stop Hypertension."   The DASH eating plan is a healthy eating plan that has been shown to reduce high blood pressure (hypertension). Additional health benefits may include reducing the risk of type 2  diabetes mellitus, heart disease, and stroke. The DASH eating plan may also help with weight loss. WHAT DO I NEED TO KNOW ABOUT THE DASH EATING PLAN? For the DASH eating plan, you will follow these general guidelines: Choose foods with a percent daily value for sodium of less than 5% (as listed on the food label). Use salt-free seasonings or herbs instead of table salt or sea salt. Check with your health care provider or pharmacist before using salt substitutes. Eat lower-sodium products, often labeled as "lower sodium" or "no salt added." Eat fresh foods. Eat more vegetables, fruits, and low-fat dairy products. Choose whole grains. Look for the word "whole" as the first word in the ingredient list. Choose fish  Limit sweets, desserts, sugars, and sugary drinks. Choose heart-healthy fats. Eat veggie cheese  Eat more home-cooked food and less restaurant, buffet, and fast food. Limit fried foods. Cook foods using methods other than frying. Limit canned vegetables. If you do use them, rinse them well to decrease the sodium. When eating at a restaurant, ask that your food be prepared with less salt, or no salt if possible.                      WHAT FOODS CAN I EAT? Read Dr Francis Dowse Fuhrman's books on The End of Dieting & The End of Diabetes  Grains Whole grain or whole wheat bread. Brown rice. Whole grain or whole wheat pasta. Quinoa, bulgur, and whole grain cereals. Low-sodium cereals. Corn or whole wheat flour tortillas. Whole grain cornbread. Whole grain crackers. Low-sodium crackers.  Vegetables Fresh or frozen vegetables (raw, steamed, roasted, or grilled). Low-sodium or reduced-sodium tomato and vegetable juices. Low-sodium or reduced-sodium tomato sauce and paste. Low-sodium or reduced-sodium canned vegetables.   Fruits All fresh, canned (in natural juice), or frozen fruits.  Protein Products  All fish and seafood.  Dried beans, peas, or lentils. Unsalted nuts and seeds. Unsalted  canned beans.  Dairy Low-fat dairy products, such as skim or 1% milk, 2% or reduced-fat cheeses, low-fat ricotta or cottage cheese, or plain low-fat yogurt. Low-sodium or reduced-sodium cheeses.  Fats and Oils Tub margarines without trans fats. Light or reduced-fat mayonnaise and salad dressings (reduced sodium). Avocado. Safflower, olive, or canola oils. Natural peanut or almond butter.  Other Unsalted popcorn and pretzels. The items listed above may not be a complete list of recommended foods or beverages. Contact your dietitian for more options.  +++++++++++++++  WHAT FOODS ARE NOT RECOMMENDED? Grains/ White flour or wheat flour White bread. White pasta. White rice. Refined cornbread. Bagels and croissants. Crackers that contain trans fat.  Vegetables  Creamed or fried vegetables. Vegetables in a . Regular canned vegetables. Regular canned tomato sauce and paste. Regular tomato and vegetable juices.  Fruits Dried fruits. Canned fruit in light or heavy syrup. Fruit juice.  Meat and Other Protein Products Meat in general - RED meat & White meat.  Fatty cuts of meat. Ribs, chicken wings, all processed meats as bacon, sausage, bologna, salami, fatback, hot dogs, bratwurst and packaged luncheon meats.  Dairy Whole or 2% milk, cream, half-and-half, and cream cheese.  Whole-fat or sweetened yogurt. Full-fat cheeses or blue cheese. Non-dairy creamers and whipped toppings. Processed cheese, cheese spreads, or cheese curds.  Condiments Onion and garlic salt, seasoned salt, table salt, and sea salt. Canned and packaged gravies. Worcestershire sauce. Tartar sauce. Barbecue sauce. Teriyaki sauce. Soy sauce, including reduced sodium. Steak sauce. Fish sauce. Oyster sauce. Cocktail sauce. Horseradish. Ketchup and mustard. Meat flavorings and tenderizers. Bouillon cubes. Hot sauce. Tabasco sauce. Marinades. Taco seasonings. Relishes.  Fats and Oils Butter, stick margarine, lard, shortening and  bacon fat. Coconut, palm kernel, or palm oils. Regular salad dressings.  Pickles and olives. Salted popcorn and pretzels.  The items listed above may not be a complete list of foods and beverages to avoid.

## 2023-08-31 NOTE — Progress Notes (Signed)
Annual Screening/Preventative Visit & Comprehensive Evaluation &  Examination   Future Appointments  Date Time Provider Department  08/31/2023                       cpe  3:00 PM [name], MD [facility]  09/12/2023  2:30 PM [facility]  09/12/2023  3:00 PM Johney Maine, MD [facility]  12/05/2023                     wellness  2:30 PM Raynelle Dick, NP [facility]  09/12/2024                        cpe  3:00 PM [name], MD [facility]   (Patient since 1996)      This very nice 77 y.o. MWF presents for a Screening /Preventative Visit & comprehensive evaluation and management of multiple medical co-morbidities.  Patient has been followed for HTN, HLD, CLL, Prediabetes  and Vitamin D Deficiency. Patient also has GERD controlled on her meds. CT scan of Abd on 05/05 /2021 showed Aortic Atherosclerosis.  Patient has hx/o Major Depression on current meds. In Jan 2021, patient was dx'd  with mild OSA and declined starting recommended CPAP.         Patient is followed by Dr Candise Che for her Indolent CLL dx'd in 2013.        HTN predates circa  1998.  Patient's BP has been controlled at home and patient denies any cardiac symptoms as chest pain, palpitations, shortness of breath, dizziness or ankle swelling. Today's BP is elevated at 138/76 .       Patient has Statin intolerance and her hyperlipidemia is controlled with diet and Ezetimibe.  Patient denies medication SE's. Last lipids were  at goal:  Lab Results  Component Value Date   CHOL 144 09/29/2021   HDL 73 09/29/2021   LDLCALC 55 09/29/2021   TRIG 80 09/29/2021   CHOLHDL 2.0 09/29/2021         Patient has hx/o prediabetes predating (A1c 6.0% /2012 ) and patient denies reactive hypoglycemic symptoms, visual blurring, diabetic polys or paresthesias. Last A1c was normal & at goal :  Lab Results  Component Value Date   HGBA1C 4.9 05/13/2021        Finally, patient has history of Vitamin D  Deficiency ("30" /2008) and last Vitamin D was  at goal:  Lab Results  Component Value Date   VD25OH 75 02/10/2021     Current Outpatient Medications  Medication Instructions   acetaminophen 650 mg  Every 6 hours PRN   ALPRAZolam 1 MG tab TAKE 1/2-1 TABLET 2-3 TIMES DAILY ONLY IF NEEDED    bisoprolol  10 MG tablet Take  1 tablet  Daily    buPROPion  XL 150 MG  Take  1 tablet  Daily     ezetimibe 10 MG tablet Take  1 tablet  Daily    FLUoxetine  20 MG capsule TAKE 1 CAPSULE  DAILY    levothyroxine 50 MCG tablet TAKE 1 TABLET DAILY   NIFEdipine-XL 30 mg , Daily   omeprazole 20 MG capsule TAKE 1 CAPSULE  BKFST & EVENING MEAL    RESTASIS 0.05 % ophth  emulsion 1 drop, Both Eyes, 2 times daily PRN    Allergies  Allergen Reactions   Hyzaar [Losartan Potassium-Hctz] Swelling and     "messed up my sodium counts"and  swelled lips   Sudafed [Pseudoephedrine Hcl] Palpitations   Levaquin [Levofloxacin In D5w] Diarrhea and Nausea Only   Acyclovir And Related Reaction not recalled   Biaxin [Clarithromycin] GI Upset   Cantaloupe Extract Allergy Skin Test  Nasal congestion, headaches, and sneezing     Celexa [Citalopram] Reaction not recalled   Flexeril [Cyclobenzaprine] zombie-like" feeling   Fosamax [Alendronate Sodium] GI upset   Iohexol Hives    Losartan Swelling / Angioedema   Meloxicam GI upset   Norvasc [Amlodipine] Swelling and Other (See Comments)    Ankles swell   Other Nausea Only and Other (See Comments)    Iceburg lettuce- "made me dreadfully nauseous" Raw mushrooms only-  "made me dreadfully nauseous" (cooked ones are tolerated)   Pseudoephedrine Palpitations   Zoloft [Sertraline] Has no emotions at all          Past Medical History:  Diagnosis Date   Anemia    Anxiety    Arthritis    CLL (chronic lymphocytic leukemia) (HCC) dx'd ~ 2015   Depression    GERD (gastroesophageal reflux disease)    Headache    "sinus headaches"   History of kidney stones     Hypertension    OSA (obstructive sleep apnea)    Osteoarthritis    Pneumonia    Prediabetes    Recurrent sinus infections 08/02/2012   Tubular adenoma of colon      Health Maintenance  Topic Date Due   Pneumococcal Vaccine 51-41 Years old (1 of 4 - PCV13) Never done   Zoster Vaccines- Shingrix (1 of 2) Never done   COVID-19 Vaccine (4 - Booster for Pfizer series) 01/16/2021   MAMMOGRAM  07/03/2021   INFLUENZA VACCINE  07/05/2021   TETANUS/TDAP  08/22/2028   DEXA SCAN  Completed   Hepatitis C Screening  Completed   PNA vac Low Risk Adult  Completed   HPV VACCINES  Aged Out     Immunization History  Administered Date(s) Administered   Fluad Quad(high Dose) 10/16/2020   Influenza, High Dose  08/29/2017, 08/22/2018, 09/11/2019   Influenza,inj,Quad PF 09/15/2014, 08/13/2015   Influenza 08/31/2013, 09/04/2014   PFIZER-SARS-COV-2 Vacc 01/16/2020, 02/10/2020, 10/16/2020   Pneumococcal -13 09/23/2015   Pneumococcal -23 08/29/2017   Pneumococcal-Unspecified 12/28/2010   Td 07/05/2006, 08/22/2018    Last Colon - 08/19/2020 - Dr Rhea Belton - Recc 5 yr f/u due Sept 2026   Last MGM - 02/19/22   Past Surgical History:  Procedure Laterality Date   ABDOMINAL HYSTERECTOMY  1980's   "endometrosis"   APPENDECTOMY     BREAST SURGERY     CATARACT EXTRACTION, BILATERAL     CHOLECYSTECTOMY  1985   FUNCTIONAL ENDOSCOPIC SINUS SURGERY  1990's   "cause I kept having sinus infections"   immunoglobulin treatment  2017   LAPAROSCOPIC APPENDECTOMY N/A 06/04/2014   Procedure: APPENDECTOMY LAPAROSCOPIC;  Surgeon: Liz Malady, MD;  Location[facility];  Service: General;  Laterality: N/A;   LUMBAR LAMINECTOMY/DECOMPRESSION MICRODISCECTOMY Left 11/01/2017   Procedure: Left Lumbar Four-Five Extraforaminal Microdiscectomy;  Surgeon: Tia Alert, MD;  Location: [facility] OR;  Service: Neurosurgery;  Laterality: Left;   LYMPH NODE BIOPSY     "determined I had CLL"   SHOULDER ARTHROSCOPY Right     TEMPOROMANDIBULAR JOINT ARTHROPLASTY  1980's   TONSILLECTOMY AND ADENOIDECTOMY  1950's    Family History  Problem Relation Age of Onset   Parkinson's disease Mother    Hypertension Mother    Hypertension Maternal Grandmother  Heart attack Maternal Grandmother        Mild   Heart attack Brother    Arthritis Brother     Social History   Tobacco Use   Smoking status: Former Smoker    Packs/day: 0.75    Years: 4.00    Pack years: 3.00    Types: Cigarettes    Quit date: 12/05/1968    Years since quitting: 52.4   Smokeless tobacco: Never Used  Vaping Use   Vaping Use: Never used  Substance Use Topics   Alcohol use: Yes    Alcohol/week: 7.0 standard drinks    Types: 7 Glasses of wine per week   Drug use: No      ROS Constitutional: Denies fever, chills, weight loss/gain, headaches, insomnia,  night sweats, and change in appetite. Does c/o fatigue. Eyes: Denies redness, blurred vision, diplopia, discharge, itchy, watery eyes.  ENT: Denies discharge, congestion, post nasal drip, epistaxis, sore throat, earache, hearing loss, dental pain, Tinnitus, Vertigo, Sinus pain, snoring.  Cardio: Denies chest pain, palpitations, irregular heartbeat, syncope, dyspnea, diaphoresis, orthopnea, PND, claudication, edema Respiratory: denies cough, dyspnea, DOE, pleurisy, hoarseness, laryngitis, wheezing.  Gastrointestinal: Denies dysphagia, heartburn, reflux, water brash, pain, cramps, nausea, vomiting, bloating, diarrhea, constipation, hematemesis, melena, hematochezia, jaundice, hemorrhoids Genitourinary: Denies dysuria, frequency, urgency, nocturia, hesitancy, discharge, hematuria, flank pain Breast: Breast lumps, nipple discharge, bleeding.  Musculoskeletal: Denies arthralgia, myalgia, stiffness, Jt. Swelling, pain, limp, and strain/sprain. Denies falls. Skin: Denies puritis, rash, hives, warts, acne, eczema, changing in skin lesion Neuro: No weakness, tremor, incoordination, spasms,  paresthesia, pain Psychiatric: Denies confusion, memory loss, sensory loss. Denies Depression. Endocrine: Denies change in weight, skin, hair change, nocturia, and paresthesia, diabetic polys, visual blurring, hyper / hypo glycemic episodes.  Heme/Lymph: No excessive bleeding, bruising, enlarged lymph nodes.  Physical Exam  BP 138/76   Pulse 62   Temp 97.9 F (36.6 C)   Resp 16   Ht 5\' 2"  (1.575 m)   Wt 199 lb (90.3 kg)   SpO2 98%   BMI 36.40 kg/m   General Appearance: Over nourished, well groomed and in no apparent distress.  Eyes: PERRLA, EOMs, conjunctiva no swelling or erythema, normal fundi and vessels. Sinuses: No frontal/maxillary tenderness ENT/Mouth: EACs patent / TMs  nl. Nares clear without erythema, swelling, mucoid exudates. Oral hygiene is good. No erythema, swelling, or exudate. Tongue normal, non-obstructing. Tonsils not swollen or erythematous. Hearing normal.  Neck: Supple, thyroid not palpable. No bruits, nodes or JVD. Respiratory: Respiratory effort normal.  BS equal and clear bilateral without rales, rhonci, wheezing or stridor. Cardio: Heart sounds are normal with regular rate and rhythm and no murmurs, rubs or gallops. Peripheral pulses are normal and equal bilaterally without edema. No aortic or femoral bruits. Chest: symmetric with normal excursions and percussion. Breasts: Deferred to 02/20/2012 MGM Abdomen: Flat, soft with bowel sounds active. Nontender, no guarding, rebound, hernias, masses, or organomegaly.  Lymphatics: Non tender without lymphadenopathy.  Musculoskeletal: Full ROM all peripheral extremities, joint stability, 5/5 strength, and normal gait. Skin: Warm and dry without rashes, lesions, cyanosis, clubbing or  ecchymosis.  Neuro: Cranial nerves intact, reflexes equal bilaterally. Normal muscle tone, no cerebellar symptoms. Sensation intact.  Pysch: Alert and oriented X 3, normal affect, Insight and Judgment appropriate.    Assessment and  Plan  1. Annual Preventative Screening Examination   2. Essential hypertension  - EKG 12-Lead - Urinalysis, Routine w reflex microscopic - Microalbumin / creatinine urine ratio - CBC with Differential/Platelet - COMPLETE METABOLIC  PANEL WITH GFR - Magnesium - TSH  3. Hyperlipidemia, mixed  - EKG 12-Lead - Lipid panel - TSH  4. Abnormal glucose  - EKG 12-Lead - Hemoglobin A1c - Insulin, random  5. Vitamin D deficiency  - VITAMIN D 25 Hydroxy   6. CLL (chronic lymphocytic leukemia) (HCC)  - CBC with Differential/Platelet  7. Chronic renal failure, stage 3a (HCC)  - COMPLETE METABOLIC PANEL WITH GFR  8. Depression, major, in remission (HCC)  - TSH  9. OSA (obstructive sleep apnea)   10. Statin myopathy  - Lipid panel  11. Atherosclerosis of aorta (HCC)  - EKG 12-Lead - Lipid panel  12. Screening for heart disease  - EKG 12-Lead  13. FHx: heart disease  - EKG 12-Lead  14. Former smoker  - EKG 12-Lead  15. Screening for colorectal cancer  - POC Hemoccult Bld/Stl   16. Medication management  - Urinalysis, Routine w reflex microscopic - Microalbumin /creatinine urine ratio - CBC with Differential/Platelet - COMPLETE METABOLIC PANEL WITH GFR - Magnesium - Lipid panel - TSH - Hemoglobin A1c - Insulin, random - VITAMIN D 25 Hydroxy        Patient was counseled in prudent diet to achieve/maintain BMI less than 25 for weight control, BP monitoring, regular exercise and medications. Discussed med's effects and SE's. Screening labs and tests as requested with regular follow-up as recommended. Over 40 minutes of exam, counseling, chart review and high complex critical decision making was performed.   Marinus Maw, MD

## 2023-09-01 LAB — MAGNESIUM: Magnesium: 1.3 mg/dL — ABNORMAL LOW (ref 1.5–2.5)

## 2023-09-01 LAB — COMPLETE METABOLIC PANEL WITH GFR
AG Ratio: 2.8 (calc) — ABNORMAL HIGH (ref 1.0–2.5)
ALT: 14 U/L (ref 6–29)
AST: 19 U/L (ref 10–35)
Albumin: 4.4 g/dL (ref 3.6–5.1)
Alkaline phosphatase (APISO): 55 U/L (ref 37–153)
BUN: 12 mg/dL (ref 7–25)
CO2: 23 mmol/L (ref 20–32)
Calcium: 9 mg/dL (ref 8.6–10.4)
Chloride: 95 mmol/L — ABNORMAL LOW (ref 98–110)
Creat: 0.99 mg/dL (ref 0.60–1.00)
Globulin: 1.6 g/dL — ABNORMAL LOW (ref 1.9–3.7)
Glucose, Bld: 91 mg/dL (ref 65–99)
Potassium: 4.1 mmol/L (ref 3.5–5.3)
Sodium: 129 mmol/L — ABNORMAL LOW (ref 135–146)
Total Bilirubin: 0.7 mg/dL (ref 0.2–1.2)
Total Protein: 6 g/dL — ABNORMAL LOW (ref 6.1–8.1)
eGFR: 59 mL/min/{1.73_m2} — ABNORMAL LOW (ref >=60)

## 2023-09-01 LAB — HEMOGLOBIN A1C
Hgb A1c MFr Bld: 5.3 %{Hb} (ref <5.7)
Mean Plasma Glucose: 105 mg/dL
eAG (mmol/L): 5.8 mmol/L

## 2023-09-01 LAB — LIPID PANEL
Cholesterol: 261 mg/dL — ABNORMAL HIGH (ref <200)
HDL: 67 mg/dL (ref >=50)
LDL Cholesterol (Calc): 170 mg/dL — ABNORMAL HIGH
Non-HDL Cholesterol (Calc): 194 mg/dL — ABNORMAL HIGH (ref <130)
Total CHOL/HDL Ratio: 3.9 (calc) (ref <5.0)
Triglycerides: 114 mg/dL (ref <150)

## 2023-09-01 LAB — PARATHYROID HORMONE, INTACT (NO CA): PTH: 45 pg/mL (ref 16–77)

## 2023-09-01 LAB — INSULIN, RANDOM: Insulin: 11.6 u[IU]/mL

## 2023-09-01 LAB — VITAMIN D 25 HYDROXY (VIT D DEFICIENCY, FRACTURES): Vit D, 25-Hydroxy: 51 ng/mL (ref 30–100)

## 2023-09-01 LAB — TSH: TSH: 4.22 m[IU]/L (ref 0.40–4.50)

## 2023-09-02 ENCOUNTER — Other Ambulatory Visit: Payer: Self-pay | Admitting: Internal Medicine

## 2023-09-02 NOTE — Progress Notes (Signed)
<>*<>*<>*<>*<>*<>*<>*<>*<>*<>*<>*<>*<>*<>*<>*<>*<>*<>*<>*<>*<>*<>*<>*<>*<> <>*<>*<>*<>*<>*<>*<>*<>*<>*<>*<>*<>*<>*<>*<>*<>*<>*<>*<>*<>*<>*<>*<>*<>*<>  -Test results slightly outside the reference range are not unusual. If there is anything important, I will review this with you,  otherwise it is considered normal test values.  If you have further questions,  please do not hesitate to contact me at the office or via My Chart.   <>*<>*<>*<>*<>*<>*<>*<>*<>*<>*<>*<>*<>*<>*<>*<>*<>*<>*<>*<>*<>*<>*<>*<>*<> <>*<>*<>*<>*<>*<>*<>*<>*<>*<>*<>*<>*<>*<>*<>*<>*<>*<>*<>*<>*<>*<>*<>*<>*<>  -  Mild chronic Anemia  ( low red cell count  ) is Stable   and  WBC is down from 88,000 and 72, to now 38,000  - great   ( Lab results also shares to Dr Candise Che )   <>*<>*<>*<>*<>*<>*<>*<>*<>*<>*<>*<>*<>*<>*<>*<>*<>*<>*<>*<>*<>*<>*<>*<>*<> <>*<>*<>*<>*<>*<>*<>*<>*<>*<>*<>*<>*<>*<>*<>*<>*<>*<>*<>*<>*<>*<>*<>*<>*<>  -  Kidney functions are stable at Stage 2 / 3 -    -  But Kidney functions still look a little dehydrated   -  So, it's Very important to drink adequate amounts of fluids                                                                               to prevent permanent damage    - Recommend drink at least 6 bottles (16 ounces) of fluids /w                                                                           ater /day = 96 Oz ~100 oz  - 100 oz = 3,000 cc or 3 liters / day  - >> That's 1 &1/2 bottles of a 2 liter soda bottle /day !   <>*<>*<>*<>*<>*<>*<>*<>*<>*<>*<>*<>*<>*<>*<>*<>*<>*<>*<>*<>*<>*<>*<>*<>*<> <>*<>*<>*<>*<>*<>*<>*<>*<>*<>*<>*<>*<>*<>*<>*<>*<>*<>*<>*<>*<>*<>*<>*<>*<>  -   Magnesium  = 1.3     is  EXTREMELY LOW  !  - goal is betw 2.0 - 2.5,   - So..............[name] Kitchen  Recommend that you take                                                   Magnesium 500 mg tablet  3 x /day with meals   - also important to eat lots of  leafy green vegetables   - spinach - Kale - collards - greens -  okra - asparagus - broccoli - quinoa   - squash - almonds - black, red, white beans  -  peas - green beans  <>*<>*<>*<>*<>*<>*<>*<>*<>*<>*<>*<>*<>*<>*<>*<>*<>*<>*<>*<>*<>*<>*<>*<>*<> <>*<>*<>*<>*<>*<>*<>*<>*<>*<>*<>*<>*<>*<>*<>*<>*<>*<>*<>*<>*<>*<>*<>*<>*<>  -  Chol is much worse  - has gone up from 195 to now 261 ! And                                   The "Bad"  LDL Chol has gone up from 98 to 170  and  -  Both are very HIGH Risk for heart Attack, Stroke     - Recommend  a stricter low cholesterol diet   - Cholesterol only comes from  animal sources                                                                           - ie. meat, dairy, egg yolks  - Eat all the vegetables you want.  - Avoid Meat, Avoid Meat,  Avoid Meat                                                                 - especially Red Meat - Beef AND Pork .  - Avoid cheese & dairy - milk & ice cream.     - Cheese is the most concentrated form of trans-fats which                                                                     is the worst thing to clog up our arteries.   <>*<>*<>*<>*<>*<>*<>*<>*<>*<>*<>*<>*<>*<>*<>*<>*<>*<>*<>*<>*<>*<>*<>*<>*<> <>*<>*<>*<>*<>*<>*<>*<>*<>*<>*<>*<>*<>*<>*<>*<>*<>*<>*<>*<>*<>*<>*<>*<>*<>  -  All Else - CBC - Kidneys - Electrolytes - Liver - Magnesium & Thyroid    - all  Normal / OK  <>*<>*<>*<>*<>*<>*<>*<>*<>*<>*<>*<>*<>*<>*<>*<>*<>*<>*<>*<>*<>*<>*<>*<>*<> <>*<>*<>*<>*<>*<>*<>*<>*<>*<>*<>*<>*<>*<>*<>*<>*<>*<>*<>*<>*<>*<>*<>*<>*<>        - Veggie cheese is OK which can be found in the fresh                                     produce section at [employer] or [employer] or [employer]  <>*<>*<>*<>*<>*<>*<>*<>*<>*<>*<>*<>*<>*<>*<>*<>*<>*<>*<>*<>*<>*<>*<>*<>*<> <>*<>*<>*<>*<>*<>*<>*<>*<>*<>*<>*<>*<>*<>*<>*<>*<>*<>*<>*<>*<>*<>*<>*<>*<>  -Test results slightly outside the reference range are not unusual.                                               If there is  anything important, I will review this with you,  otherwise it is considered normal test values.  If you have further questions,  please do not hesitate to contact me at the office or via My Chart.   <>*<>*<>*<>*<>*<>*<>*<>*<>*<>*<>*<>*<>*<>*<>*<>*<>*<>*<>*<>*<>*<>*<>*<>*<> <>*<>*<>*<>*<>*<>*<>*<>*<>*<>*<>*<>*<>*<>*<>*<>*<>*<>*<>*<>*<>*<>*<>*<>*<>  - A1c - Normal - No Diabetes  - Great !   <>*<>*<>*<>*<>*<>*<>*<>*<>*<>*<>*<>*<>*<>*<>*<>*<>*<>*<>*<>*<>*<>*<>*<>*<> <>*<>*<>*<>*<>*<>*<>*<>*<>*<>*<>*<>*<>*<>*<>*<>*<>*<>*<>*<>*<>*<>*<>*<>*<>  -  Vitamin D=

## 2023-09-11 ENCOUNTER — Other Ambulatory Visit: Payer: Self-pay

## 2023-09-11 DIAGNOSIS — C9110 Chronic lymphocytic leukemia of B-cell type not having achieved remission: Secondary | ICD-10-CM

## 2023-09-12 ENCOUNTER — Inpatient Hospital Stay: Payer: Medicare Other | Admitting: Hematology

## 2023-09-12 ENCOUNTER — Inpatient Hospital Stay: Payer: Medicare Other | Attending: Hematology

## 2023-09-13 ENCOUNTER — Other Ambulatory Visit: Payer: Self-pay | Admitting: Internal Medicine

## 2023-09-13 MED ORDER — ROSUVASTATIN CALCIUM 20 MG PO TABS: ORAL_TABLET | ORAL | 3 refills | Status: DC

## 2023-09-18 ENCOUNTER — Other Ambulatory Visit: Payer: Self-pay | Admitting: Nurse Practitioner

## 2023-10-10 ENCOUNTER — Other Ambulatory Visit: Payer: Medicare Other

## 2023-10-10 ENCOUNTER — Ambulatory Visit: Payer: Medicare Other | Admitting: Hematology

## 2023-11-13 ENCOUNTER — Inpatient Hospital Stay: Payer: Medicare Other | Attending: Hematology | Admitting: Hematology

## 2023-11-13 ENCOUNTER — Inpatient Hospital Stay: Payer: Medicare Other

## 2023-11-13 NOTE — Progress Notes (Shared)
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 (patient reports -planned for August 2017) 13q deletion does pre-dispose her to recurrent SCC  Current Treatment: observation  Previous treatment: IVIG for several months last winter to reduce recurrent respiratory infections (Patient notes that this helped) Has not required definitive treatment for CLL at this time and has been reluctant to consider treatment when discussed in the setting of thrombocytopenia.  INTERVAL HISTORY:  Rebecca Bond here for continued evaluation and management of [name].  Patient was last seen by me on 12/27/2022 and she complained of occasional constipation and mild fatigue.    -Discussed lab results from today, 11/13/2023, in detail with the patient.   REVIEW OF SYSTEMS:   10 Point review of Systems was done is negative except as noted above.   Past Medical History:  Diagnosis Date   Anemia    Anxiety    Arthritis    CLL (chronic lymphocytic leukemia) (HCC) dx'd ~ 2015   Depression    GERD (gastroesophageal reflux disease)    Headache    "sinus headaches"   History of kidney stones    Hypertension    OSA (obstructive sleep apnea)    Osteoarthritis    Pneumonia    Prediabetes    Recurrent sinus infections 08/02/2012   Tubular adenoma of colon     . Past Surgical History:  Procedure Laterality Date   ABDOMINAL  HYSTERECTOMY  1980's   "endometrosis"   APPENDECTOMY     BREAST SURGERY     CATARACT EXTRACTION, BILATERAL     CHOLECYSTECTOMY  1985   FUNCTIONAL ENDOSCOPIC SINUS SURGERY  1990's   "cause I kept having sinus infections"   immunoglobulin treatment  2017   LAPAROSCOPIC APPENDECTOMY N/A 06/04/2014   Procedure: APPENDECTOMY LAPAROSCOPIC;  Surgeon: Liz Malady, MD;  Location: [facility] OR;  Service: General;  Laterality: N/A;   LUMBAR LAMINECTOMY/DECOMPRESSION MICRODISCECTOMY Left 11/01/2017   Procedure: Left Lumbar Four-Five Extraforaminal Microdiscectomy;  Surgeon: Tia Alert, MD;  Location: [facility] OR;  Service: Neurosurgery;  Laterality: Left;   LYMPH NODE BIOPSY     "determined I had CLL"   SHOULDER ARTHROSCOPY Right    TEMPOROMANDIBULAR JOINT ARTHROPLASTY  1980's   TONSILLECTOMY AND ADENOIDECTOMY  1950's    . Social History   Tobacco Use   Smoking status: Former    Current packs/day: 0.00    Average packs/day: 0.8 packs/day for 4.0 years (3.0 ttl pk-yrs)    Types: Cigarettes    Start date: 12/05/1964    Quit date: 12/05/1968    Years since quitting: 54.9   Smokeless tobacco: Never  Vaping Use   Vaping status: Never Used  Substance Use Topics   Alcohol use: Yes    Alcohol/week: 7.0 standard drinks of alcohol    Types: 7 Glasses of wine per week   Drug use: No    ALLERGIES:  is allergic to hyzaar [losartan potassium-hctz], sudafed [pseudoephedrine hcl], levaquin [levofloxacin in  d5w], acyclovir and related, biaxin [clarithromycin], cantaloupe extract allergy skin test, celexa [citalopram], ciprofloxacin, flexeril [cyclobenzaprine], fosamax [alendronate sodium], gabapentin, iohexol, losartan, meloxicam, norvasc [amlodipine], other, pseudoephedrine, and zoloft [sertraline hcl].  MEDICATIONS:  Current Outpatient Medications  Medication Sig Dispense Refill   Ascorbic Acid (VITAMIN C PO) 1500 mg by oral route.     buPROPion (WELLBUTRIN XL) 150 MG 24 hr tablet Take  1 tablet  every  Morning for Mood / Depression, Focus  & Concentration 90 tablet 3   busPIRone (BUSPAR) 10 MG tablet TAKE 1 TABLET(10 MG) BY MOUTH THREE TIMES DAILY 90 tablet 1   Cholecalciferol 50 MCG (2000 UT) TABS 10,000 units     cloNIDine (CATAPRES) 0.1 MG tablet Take 1-2 tables PO; May repeart every 45-60 mins PRN up to a total of 4 doses until symptoms resolve.  Monitor BP 90 tablet 0   FLUoxetine (PROZAC) 20 MG capsule Take 1 capsule Daily for Mood, Focus & Concentration 90 capsule 3   hydrOXYzine (ATARAX) 10 MG tablet Take 1 tablet 4 x / day for Anxiety 120 tablet 11   levothyroxine (SYNTHROID) 50 MCG tablet TAKE 1 TABLET(50 MCG) BY MOUTH DAILY 30 tablet 11   montelukast (SINGULAIR) 10 MG tablet Take 1 tablet daily for Allergies & Asthma 90 tablet 3   pantoprazole (PROTONIX) 40 MG tablet Take  1 tablet   Daily   to Prevent Heartburn &  Indigestion 90 tablet 3   RESTASIS 0.05 % ophthalmic emulsion Place 1 drop into both eyes 2 (two) times daily as needed (for seasonal allergies/irritation).      rosuvastatin (CRESTOR) 20 MG tablet Take 1 tablet Daily for Cholesterol 90 tablet 3   No current facility-administered medications for this visit.    PHYSICAL EXAMINATION: ECOG PERFORMANCE STATUS: 1  There were no vitals filed for this visit.    There were no vitals filed for this visit.   .There is no height or weight on file to calculate BMI.  NAD GENERAL:alert, in no acute distress and comfortable SKIN: no acute rashes, no significant lesions EYES: conjunctiva are pink and non-injected, sclera anicteric OROPHARYNX: MMM, no exudates, no oropharyngeal erythema or ulceration NECK: supple, no JVD LYMPH:  no palpable lymphadenopathy in the cervical, axillary or inguinal regions LUNGS: clear to auscultation b/l with normal respiratory effort HEART: regular rate & rhythm ABDOMEN:  normoactive bowel sounds , non tender, not distended. Extremity: no pedal edema PSYCH: alert & oriented x 3 with fluent  speech NEURO: no focal motor/sensory deficits    LABORATORY DATA:      Latest Ref Rng & Units 08/31/2023    3:11 PM 08/01/2023    4:04 PM 12/27/2022    1:41 PM  CBC  WBC 3.8 - 10.8 Thousand/uL 38.1  72.1  88.2   Hemoglobin 11.7 - 15.5 g/dL 16.1  09.6  04.5   Hematocrit 35.0 - 45.0 % 32.6  34.2  30.5   Platelets 140 - 400 Thousand/uL 133  85  119    CBC    Component Value Date/Time   WBC 38.1 (H) 08/31/2023 1511   RBC 3.46 (L) 08/31/2023 1511   HGB 11.0 (L) 08/31/2023 1511   HGB 10.0 (L) 12/27/2022 1341   HGB 11.2 (L) 11/08/2017 1418   HCT 32.6 (L) 08/31/2023 1511   HCT 34.8 11/08/2017 1418   PLT 133 (L) 08/31/2023 1511   PLT 119 (L) 12/27/2022 1341   PLT 140 (L) 11/08/2017 1418   MCV 94.2 08/31/2023 1511  MCV 96.4 11/08/2017 1418   MCH 31.8 08/31/2023 1511   MCHC 33.7 08/31/2023 1511   RDW 12.1 08/31/2023 1511   RDW 14.8 (H) 11/08/2017 1418   LYMPHSABS 30,975 (H) 08/31/2023 1511   LYMPHSABS 87.0 (H) 11/08/2017 1418   MONOABS 0.6 12/27/2022 1341   MONOABS 1.5 (H) 11/08/2017 1418   EOSABS 152 08/31/2023 1511   EOSABS 0.6 (H) 11/08/2017 1418   BASOSABS 38 08/31/2023 1511   BASOSABS 0.2 (H) 11/08/2017 1418       Latest Ref Rng & Units 08/31/2023    3:11 PM 08/10/2023    2:01 PM 08/01/2023    4:04 PM  CMP  Glucose 65 - 99 mg/dL 91  102  72   BUN 7 - 25 mg/dL 12  11  23    Creatinine 0.60 - 1.00 mg/dL 7.25  3.66  4.40   Sodium 135 - 146 mmol/L 129  125  127   Potassium 3.5 - 5.3 mmol/L 4.1  3.9  3.8   Chloride 98 - 110 mmol/L 95  90  91   CO2 20 - 32 mmol/L 23  23  28    Calcium 8.6 - 10.4 mg/dL 9.0  9.7  9.1   Total Protein 6.1 - 8.1 g/dL 6.0  6.3  6.1   Total Bilirubin 0.2 - 1.2 mg/dL 0.7  0.9  1.1   AST 10 - 35 U/L 19  20  30    ALT 6 - 29 U/L 14  18  28     . Lab Results  Component Value Date   LDH 149 12/27/2022       RADIOGRAPHIC STUDIES: I have personally reviewed the radiological images as listed and agreed with the findings in the report.  CT  ABDOMEN AND PELVIS WITHOUT CONTRAST    IMPRESSION: 1. No acute abnormalities within the abdomen or pelvis. 2. Mild splenomegaly and several prominent to mildly enlarged pelvic and inguinal lymph nodes. These findings are similar to the prior CT. 3. Small nonobstructing stone in the lower pole the right kidney. No ureteral stones or obstructive uropathy.     Electronically Signed   By: Amie Portland M.D.   On: 11/14/2016 16:39     ASSESSMENT & PLAN:   77 y.o.  caucasian female with   1. Rai Stage 2 previously - now Stage IV CLL with lymphocytosis, LNadenopathy and mild splenomegaly with anemia and thrombocytopenia. -patient has previously and continues to desire holding off CLL directed treatment as long as possible.   Lab Results  Component Value Date   LDH 149 12/27/2022   2.  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 Has been taking good infection prevention precautions. CLL initially diagnosed 2009 with 13q deletion. Previously  has been intermittent IVIG, clinically very helpful with decrease in respiratory infections.  Last imaging was CT AP 02-2015. --showed no evidence of Splenomegaly. Negative Hep B serology 2015  No overt new constitutional symptoms except mild unchanged fatigue. Has had significant recurrent headaches with IVIG - discontinued  3. Mild Anemia Hgb hemoglobin stable in the 10.5-11.3 range.  4. Moderate thrombocytopenia PLT count a little better at 119k  5. H/o  Recurrent cutaneous SCC s/p Mohs surgery for SCC of the nosehealed. No issues with infection-   her 13q deletion places her at risk for recurrent SCC. -continue close f/u with dermatologist for evaluation and management of non melanoma skin cancers that can be increased in patient with CLL with 13q deletion.  PLAN: -Discussed lab results from today, 12/27/2022,  with the patient. CBC shows WBC of 88.2, hemoglobin of 10.0, hematocrit of 30.5, and platelets of 119. CMP shows sodium of 132. LDH  WNL -Recommended RSV vaccine, COVID-19 Booster, and staying up to speed with other age appropriate vaccines.  Patient has no clinical or lab evidence of significant symptomatic CLL progression at this time. Patient on prednisone due to gout flare which might also be elevating the white counts over her baseline. -No clear indication for initiating CLL treatment at this time   FOLLOW-UP: ***  The total time spent in the appointment was *** minutes* .  All of the patient's questions were answered with apparent satisfaction. The patient knows to call the clinic with any problems, questions or concerns.   Wyvonnia Lora MD MS AAHIVMS [facility] Hematology/Oncology Physician [facility]  .*Total Encounter Time as defined by the Centers for Medicare and Medicaid Services includes, in addition to the face-to-face time of a patient visit (documented in the note above) non-face-to-face time: obtaining and reviewing outside history, ordering and reviewing medications, tests or procedures, care coordination (communications with other health care professionals or caregivers) and documentation in the medical record.   I,Param Shah,acting as a Neurosurgeon for Wyvonnia Lora, MD.,have documented all relevant documentation on the behalf of Wyvonnia Lora, MD,as directed by  Wyvonnia Lora, MD while in the presence of Wyvonnia Lora, MD.

## 2023-11-17 ENCOUNTER — Other Ambulatory Visit: Payer: Self-pay | Admitting: Internal Medicine

## 2023-11-17 MED ORDER — PROMETHAZINE-DM 6.25-15 MG/5ML PO SYRP: ORAL_SOLUTION | ORAL | 3 refills | Status: DC

## 2023-11-20 ENCOUNTER — Ambulatory Visit: Payer: Medicare Other | Admitting: Nurse Practitioner

## 2023-12-05 ENCOUNTER — Ambulatory Visit: Payer: Medicare Other | Admitting: Nurse Practitioner

## 2023-12-13 ENCOUNTER — Other Ambulatory Visit: Payer: Self-pay

## 2023-12-13 ENCOUNTER — Other Ambulatory Visit: Payer: Self-pay | Admitting: Internal Medicine

## 2023-12-13 DIAGNOSIS — F419 Anxiety disorder, unspecified: Secondary | ICD-10-CM

## 2023-12-13 MED ORDER — HYDROXYZINE HCL 10 MG PO TABS: ORAL_TABLET | ORAL | 3 refills | Status: DC

## 2023-12-13 MED ORDER — BUSPIRONE HCL 10 MG PO TABS
ORAL_TABLET | ORAL | 3 refills | Status: DC
Start: 2023-12-13 — End: 2024-10-11

## 2023-12-26 ENCOUNTER — Other Ambulatory Visit: Payer: Self-pay

## 2023-12-26 DIAGNOSIS — F325 Major depressive disorder, single episode, in full remission: Secondary | ICD-10-CM

## 2023-12-26 DIAGNOSIS — F419 Anxiety disorder, unspecified: Secondary | ICD-10-CM

## 2023-12-26 DIAGNOSIS — F331 Major depressive disorder, recurrent, moderate: Secondary | ICD-10-CM

## 2023-12-26 MED ORDER — FLUOXETINE HCL 20 MG PO CAPS: ORAL_CAPSULE | ORAL | 3 refills | Status: DC

## 2023-12-26 MED ORDER — BUPROPION HCL ER (XL) 150 MG PO TB24: ORAL_TABLET | ORAL | 3 refills | Status: DC

## 2023-12-27 ENCOUNTER — Ambulatory Visit: Payer: Medicare Other | Admitting: Hematology

## 2023-12-27 ENCOUNTER — Other Ambulatory Visit: Payer: Medicare Other

## 2024-01-10 ENCOUNTER — Ambulatory Visit: Payer: Medicare Other | Admitting: Hematology

## 2024-01-10 ENCOUNTER — Other Ambulatory Visit: Payer: Medicare Other

## 2024-01-16 ENCOUNTER — Other Ambulatory Visit: Payer: Self-pay

## 2024-01-16 DIAGNOSIS — Z79899 Other long term (current) drug therapy: Secondary | ICD-10-CM

## 2024-01-16 DIAGNOSIS — F19982 Other psychoactive substance use, unspecified with psychoactive substance-induced sleep disorder: Secondary | ICD-10-CM

## 2024-01-16 MED ORDER — CLONIDINE HCL 0.1 MG PO TABS: ORAL_TABLET | ORAL | 0 refills | Status: DC

## 2024-01-30 ENCOUNTER — Other Ambulatory Visit: Payer: Medicare Other

## 2024-01-30 ENCOUNTER — Ambulatory Visit: Payer: Medicare Other | Admitting: Hematology

## 2024-02-20 ENCOUNTER — Encounter: Payer: Self-pay | Admitting: Physician Assistant

## 2024-02-27 ENCOUNTER — Ambulatory Visit: Payer: Medicare Other | Admitting: Hematology

## 2024-02-27 ENCOUNTER — Other Ambulatory Visit: Payer: Medicare Other

## 2024-03-05 ENCOUNTER — Ambulatory Visit: Payer: Medicare Other | Admitting: Internal Medicine

## 2024-03-27 ENCOUNTER — Other Ambulatory Visit

## 2024-03-27 ENCOUNTER — Ambulatory Visit: Admitting: Hematology

## 2024-04-02 DIAGNOSIS — F411 Generalized anxiety disorder: Secondary | ICD-10-CM | POA: Diagnosis not present

## 2024-04-02 DIAGNOSIS — E039 Hypothyroidism, unspecified: Secondary | ICD-10-CM | POA: Diagnosis not present

## 2024-04-02 DIAGNOSIS — I10 Essential (primary) hypertension: Secondary | ICD-10-CM | POA: Diagnosis not present

## 2024-04-02 DIAGNOSIS — E782 Mixed hyperlipidemia: Secondary | ICD-10-CM | POA: Diagnosis not present

## 2024-04-15 ENCOUNTER — Ambulatory Visit: Admitting: Physician Assistant

## 2024-04-15 ENCOUNTER — Encounter: Payer: Self-pay | Admitting: Gastroenterology

## 2024-04-16 NOTE — Progress Notes (Incomplete)
 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 (patient reports -planned for August 2017) 13q deletion does pre-dispose her to recurrent SCC  Current Treatment: observation  Previous treatment: IVIG for several months last winter to reduce recurrent respiratory infections (Patient notes that this helped) Has not required definitive treatment for CLL at this time and has been reluctant to consider treatment when discussed in the setting of thrombocytopenia.  INTERVAL HISTORY:  Rebecca Bond is a 78 y.o. female here for continued evaluation and management of [name].  Patient was last seen by me on 12/27/2022 and reported having a fall, occasional mild constipation, and mild fatigue.  Today,  -Discussed lab results on 04/17/2024 in detail with patient. CBC showed WBC of ***K, hemoglobin of ***, and platelets of ***K. -  Fluoxetine  20 mg and Wellbutrin  150 mg.    REVIEW OF SYSTEMS:    10 Point review of Systems was done is negative except as noted above.   Past Medical History:  Diagnosis Date   Anemia    Anxiety    Arthritis    CLL (chronic lymphocytic leukemia) (HCC) dx'd ~ 2015   Depression    GERD (gastroesophageal reflux disease)    Headache    "sinus headaches"   History of kidney stones    Hypertension    OSA (obstructive sleep apnea)    Osteoarthritis    Pneumonia    Prediabetes    Recurrent  sinus infections 08/02/2012   Tubular adenoma of colon     . Past Surgical History:  Procedure Laterality Date   ABDOMINAL HYSTERECTOMY  1980's   "endometrosis"   APPENDECTOMY     BREAST SURGERY     CATARACT EXTRACTION, BILATERAL     CHOLECYSTECTOMY  1985   FUNCTIONAL ENDOSCOPIC SINUS SURGERY  1990's   "cause I kept having sinus infections"   immunoglobulin treatment  2017   LAPAROSCOPIC APPENDECTOMY N/A 06/04/2014   Procedure: APPENDECTOMY LAPAROSCOPIC;  Surgeon: Cloyce Darby, MD;  Location: [facility] OR;  Service: General;  Laterality: N/A;   LUMBAR LAMINECTOMY/DECOMPRESSION MICRODISCECTOMY Left 11/01/2017   Procedure: Left Lumbar Four-Five Extraforaminal Microdiscectomy;  Surgeon: Isadora Mar, MD;  Location: [facility] OR;  Service: Neurosurgery;  Laterality: Left;   LYMPH NODE BIOPSY     "determined I had CLL"   SHOULDER ARTHROSCOPY Right    TEMPOROMANDIBULAR JOINT ARTHROPLASTY  1980's   TONSILLECTOMY AND ADENOIDECTOMY  1950's    . Social History   Tobacco Use   Smoking status: Former    Current packs/day: 0.00    Average packs/day: 0.8 packs/day for 4.0 years (3.0 ttl pk-yrs)    Types: Cigarettes    Start date: 12/05/1964    Quit date: 12/05/1968    Years since quitting: 55.4   Smokeless tobacco: Never  Vaping Use   Vaping status: Never Used  Substance Use Topics   Alcohol use: Yes    Alcohol/week: 7.0 standard drinks of alcohol    Types:  7 Glasses of wine per week   Drug use: No    ALLERGIES:  is allergic to hyzaar [losartan potassium-hctz], sudafed [pseudoephedrine hcl], levaquin  [levofloxacin  in d5w], acyclovir and related, biaxin [clarithromycin], cantaloupe extract allergy skin test, celexa [citalopram], ciprofloxacin , flexeril [cyclobenzaprine], fosamax [alendronate sodium], gabapentin , iohexol , losartan, meloxicam, norvasc [amlodipine], other, pseudoephedrine, and zoloft [sertraline hcl].  MEDICATIONS:  Current Outpatient Medications  Medication Sig Dispense Refill    Ascorbic Acid  (VITAMIN C PO) 1500 mg by oral route.     buPROPion  (WELLBUTRIN  XL) 150 MG 24 hr tablet Take  1 tablet  every Morning for Mood / Depression, Focus  & Concentration 90 tablet 3   busPIRone  (BUSPAR ) 10 MG tablet Take 1 tablet  3  x / day for anxiety 270 tablet 3   Cholecalciferol  50 MCG (2000 UT) TABS 10,000 units     cloNIDine  (CATAPRES ) 0.1 MG tablet Take 1-2 tables PO; May repeart every 45-60 mins PRN up to a total of 4 doses until symptoms resolve.  Monitor BP 90 tablet 0   FLUoxetine  (PROZAC ) 20 MG capsule Take 1 capsule Daily for Mood, Focus & Concentration 90 capsule 3   hydrOXYzine  (ATARAX ) 10 MG tablet Take 1 tablet 4 x / day for Anxiety 360 tablet 3   levothyroxine  (SYNTHROID ) 50 MCG tablet TAKE 1 TABLET(50 MCG) BY MOUTH DAILY 30 tablet 11   montelukast  (SINGULAIR ) 10 MG tablet Take 1 tablet daily for Allergies & Asthma 90 tablet 3   pantoprazole  (PROTONIX ) 40 MG tablet Take  1 tablet   Daily   to Prevent Heartburn &  Indigestion 90 tablet 3   promethazine -dextromethorphan (PROMETHAZINE -DM) 6.25-15 MG/5ML syrup Take 1 tsp every 4 hours if needed for cough 473 mL 3   RESTASIS  0.05 % ophthalmic emulsion Place 1 drop into both eyes 2 (two) times daily as needed (for seasonal allergies/irritation).      rosuvastatin  (CRESTOR ) 20 MG tablet Take 1 tablet Daily for Cholesterol 90 tablet 3   No current facility-administered medications for this visit.    PHYSICAL EXAMINATION: ECOG PERFORMANCE STATUS: 1  There were no vitals filed for this visit.    There were no vitals filed for this visit.   .There is no height or weight on file to calculate BMI.    GENERAL:alert, in no acute distress and comfortable SKIN: no acute rashes, no significant lesions EYES: conjunctiva are pink and non-injected, sclera anicteric OROPHARYNX: MMM, no exudates, no oropharyngeal erythema or ulceration NECK: supple, no JVD LYMPH:  no palpable lymphadenopathy in the cervical, axillary or  inguinal regions LUNGS: clear to auscultation b/l with normal respiratory effort HEART: regular rate & rhythm ABDOMEN:  normoactive bowel sounds , non tender, not distended. Extremity: no pedal edema PSYCH: alert & oriented x 3 with fluent speech NEURO: no focal motor/sensory deficits  LABORATORY DATA:      Latest Ref Rng & Units 08/31/2023    3:11 PM 08/01/2023    4:04 PM 12/27/2022    1:41 PM  CBC  WBC 3.8 - 10.8 Thousand/uL 38.1  72.1  88.2   Hemoglobin 11.7 - 15.5 g/dL 14.7  82.9  56.2   Hematocrit 35.0 - 45.0 % 32.6  34.2  30.5   Platelets 140 - 400 Thousand/uL 133  85  119    CBC    Component Value Date/Time   WBC 38.1 (H) 08/31/2023 1511   RBC 3.46 (L) 08/31/2023 1511   HGB 11.0 (L) 08/31/2023 1511   HGB 10.0 (L) 12/27/2022 1341  HGB 11.2 (L) 11/08/2017 1418   HCT 32.6 (L) 08/31/2023 1511   HCT 34.8 11/08/2017 1418   PLT 133 (L) 08/31/2023 1511   PLT 119 (L) 12/27/2022 1341   PLT 140 (L) 11/08/2017 1418   MCV 94.2 08/31/2023 1511   MCV 96.4 11/08/2017 1418   MCH 31.8 08/31/2023 1511   MCHC 33.7 08/31/2023 1511   RDW 12.1 08/31/2023 1511   RDW 14.8 (H) 11/08/2017 1418   LYMPHSABS 30,975 (H) 08/31/2023 1511   LYMPHSABS 87.0 (H) 11/08/2017 1418   MONOABS 0.6 12/27/2022 1341   MONOABS 1.5 (H) 11/08/2017 1418   EOSABS 152 08/31/2023 1511   EOSABS 0.6 (H) 11/08/2017 1418   BASOSABS 38 08/31/2023 1511   BASOSABS 0.2 (H) 11/08/2017 1418       Latest Ref Rng & Units 08/31/2023    3:11 PM 08/10/2023    2:01 PM 08/01/2023    4:04 PM  CMP  Glucose 65 - 99 mg/dL 91  161  72   BUN 7 - 25 mg/dL 12  11  23    Creatinine 0.60 - 1.00 mg/dL 0.96  0.45  4.09   Sodium 135 - 146 mmol/L 129  125  127   Potassium 3.5 - 5.3 mmol/L 4.1  3.9  3.8   Chloride 98 - 110 mmol/L 95  90  91   CO2 20 - 32 mmol/L 23  23  28    Calcium  8.6 - 10.4 mg/dL 9.0  9.7  9.1   Total Protein 6.1 - 8.1 g/dL 6.0  6.3  6.1   Total Bilirubin 0.2 - 1.2 mg/dL 0.7  0.9  1.1   AST 10 - 35 U/L 19  20  30     ALT 6 - 29 U/L 14  18  28     . Lab Results  Component Value Date   LDH 149 12/27/2022       RADIOGRAPHIC STUDIES: I have personally reviewed the radiological images as listed and agreed with the findings in the report.  CT ABDOMEN AND PELVIS WITHOUT CONTRAST    IMPRESSION: 1. No acute abnormalities within the abdomen or pelvis. 2. Mild splenomegaly and several prominent to mildly enlarged pelvic and inguinal lymph nodes. These findings are similar to the prior CT. 3. Small nonobstructing stone in the lower pole the right kidney. No ureteral stones or obstructive uropathy.     Electronically Signed   By: Amanda Jungling M.D.   On: 11/14/2016 16:39     ASSESSMENT & PLAN:   78 y.o.  caucasian female with   1. Rai Stage 2 previously - now Stage IV CLL with lymphocytosis, LNadenopathy and mild splenomegaly with anemia and thrombocytopenia. -patient has previously and continues to desire holding off CLL directed treatment as long as possible.   Lab Results  Component Value Date   LDH 149 12/27/2022   2.  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 Has been taking good infection prevention precautions. CLL initially diagnosed 2009 with 13q deletion. Previously  has been intermittent IVIG, clinically very helpful with decrease in respiratory infections.  Last imaging was CT AP 02-2015. --showed no evidence of Splenomegaly. Negative Hep B serology 2015  No overt new constitutional symptoms except mild unchanged fatigue. Has had significant recurrent headaches with IVIG - discontinued  3. Mild Anemia Hgb hemoglobin stable in the 10.5-11.3 range.  4. Moderate thrombocytopenia PLT count a little better at 119k  5. H/o  Recurrent cutaneous SCC s/p Mohs surgery for SCC of the nosehealed. No issues  with infection-   her 13q deletion places her at risk for recurrent SCC. -continue close f/u with dermatologist for evaluation and management of non melanoma skin cancers that can be  increased in patient with CLL with 13q deletion.  PLAN: -Discussed lab results from today, 12/27/2022, with the patient. CBC shows WBC of 88.2, hemoglobin of 10.0, hematocrit of 30.5, and platelets of 119. CMP shows sodium of 132. LDH WNL -Recommended RSV vaccine, COVID-19 Booster, and staying up to speed with other age appropriate vaccines.  Patient has no clinical or lab evidence of significant symptomatic CLL progression at this time. Patient on prednisone  due to gout flare which might also be elevating the white counts over her baseline. -No clear indication for initiating CLL treatment at this time   FOLLOW-UP: ***  The total time spent in the appointment was *** minutes* .  All of the patient's questions were answered with apparent satisfaction. The patient knows to call the clinic with any problems, questions or concerns.   Jacquelyn Matt MD MS AAHIVMS [facility] Hematology/Oncology Physician [facility]  .*Total Encounter Time as defined by the Centers for Medicare and Medicaid Services includes, in addition to the face-to-face time of a patient visit (documented in the note above) non-face-to-face time: obtaining and reviewing outside history, ordering and reviewing medications, tests or procedures, care coordination (communications with other health care professionals or caregivers) and documentation in the medical record.    I,Mitra Faeizi,acting as a Neurosurgeon for Jacquelyn Matt, MD.,have documented all relevant documentation on the behalf of Jacquelyn Matt, MD,as directed by  Jacquelyn Matt, MD while in the presence of Jacquelyn Matt, MD.  ***

## 2024-04-17 ENCOUNTER — Inpatient Hospital Stay

## 2024-04-17 ENCOUNTER — Telehealth: Payer: Self-pay | Admitting: *Deleted

## 2024-04-17 ENCOUNTER — Inpatient Hospital Stay: Admitting: Hematology

## 2024-04-17 ENCOUNTER — Telehealth: Payer: Self-pay | Admitting: Hematology

## 2024-04-17 NOTE — Telephone Encounter (Signed)
 Patient called to report she could not come to appts this afternoon for lab and see Dr. Salomon Cree due to having diarrhea. She states she's had it off and on for months. Had an appt scheduled for Monday 04/15/24 with [facility], but appt was rescheduled by their office d/t provider illness - new appt date 6/26.  She asked to r/s appts at [facility] for afternoon.    She has appt to establish new PCP due to death of Dr. Cassondra Cliff. She has appt w/Dr. Gloria Lares at [facility] in October.  Dr. Salomon Cree informed. He requested patient be seen as soon as opening available as he has not seen her in 9 months due to cancellations.  Patient informed of Dr. Epifania Haskell request and encouraged to contact scheduling. Schedule message sent.

## 2024-04-17 NOTE — Telephone Encounter (Signed)
 Left patient a vm regarding upcoming appointment

## 2024-04-23 ENCOUNTER — Telehealth: Payer: Self-pay | Admitting: Hematology

## 2024-04-23 NOTE — Telephone Encounter (Signed)
 Rescheduled appointments per the patients request. The patient is aware of the changes made.

## 2024-05-03 ENCOUNTER — Other Ambulatory Visit

## 2024-05-03 ENCOUNTER — Ambulatory Visit: Admitting: Hematology

## 2024-05-30 ENCOUNTER — Ambulatory Visit: Admitting: Gastroenterology

## 2024-06-03 ENCOUNTER — Inpatient Hospital Stay: Admitting: Hematology

## 2024-06-03 ENCOUNTER — Inpatient Hospital Stay: Attending: Hematology

## 2024-06-03 VITALS — BP 159/76 | HR 69 | Temp 97.2°F | Resp 18 | Wt 202.6 lb

## 2024-06-03 DIAGNOSIS — C9110 Chronic lymphocytic leukemia of B-cell type not having achieved remission: Secondary | ICD-10-CM

## 2024-06-03 DIAGNOSIS — D696 Thrombocytopenia, unspecified: Secondary | ICD-10-CM

## 2024-06-03 LAB — CBC WITH DIFFERENTIAL (CANCER CENTER ONLY)
Abs Immature Granulocytes: 0.11 10*3/uL — ABNORMAL HIGH (ref 0.00–0.07)
Basophils Absolute: 0.1 10*3/uL (ref 0.0–0.1)
Basophils Relative: 0 %
Eosinophils Absolute: 0.2 10*3/uL (ref 0.0–0.5)
Eosinophils Relative: 1 %
HCT: 35.7 % — ABNORMAL LOW (ref 36.0–46.0)
Hemoglobin: 12.3 g/dL (ref 12.0–15.0)
Immature Granulocytes: 0 %
Lymphocytes Relative: 83 %
Lymphs Abs: 28.9 10*3/uL — ABNORMAL HIGH (ref 0.7–4.0)
MCH: 31.7 pg (ref 26.0–34.0)
MCHC: 34.5 g/dL (ref 30.0–36.0)
MCV: 92 fL (ref 80.0–100.0)
Monocytes Absolute: 1.2 10*3/uL — ABNORMAL HIGH (ref 0.1–1.0)
Monocytes Relative: 3 %
Neutro Abs: 4.4 10*3/uL (ref 1.7–7.7)
Neutrophils Relative %: 13 %
Platelet Count: 89 10*3/uL — ABNORMAL LOW (ref 150–400)
RBC: 3.88 MIL/uL (ref 3.87–5.11)
RDW: 12.9 % (ref 11.5–15.5)
Smear Review: NORMAL
WBC Count: 34.9 10*3/uL — ABNORMAL HIGH (ref 4.0–10.5)
nRBC: 0 % (ref 0.0–0.2)

## 2024-06-03 LAB — CMP (CANCER CENTER ONLY)
ALT: 13 U/L (ref 0–44)
AST: 21 U/L (ref 15–41)
Albumin: 4.5 g/dL (ref 3.5–5.0)
Alkaline Phosphatase: 59 U/L (ref 38–126)
Anion gap: 9 (ref 5–15)
BUN: 15 mg/dL (ref 8–23)
CO2: 27 mmol/L (ref 22–32)
Calcium: 10 mg/dL (ref 8.9–10.3)
Chloride: 99 mmol/L (ref 98–111)
Creatinine: 0.83 mg/dL (ref 0.44–1.00)
GFR, Estimated: >60 mL/min (ref >60)
Glucose, Bld: 117 mg/dL — ABNORMAL HIGH (ref 70–99)
Potassium: 4 mmol/L (ref 3.5–5.1)
Sodium: 135 mmol/L (ref 135–145)
Total Bilirubin: 0.9 mg/dL (ref 0.0–1.2)
Total Protein: 6.3 g/dL — ABNORMAL LOW (ref 6.5–8.1)

## 2024-06-03 LAB — LACTATE DEHYDROGENASE: LDH: 170 U/L (ref 98–192)

## 2024-06-03 LAB — URIC ACID: Uric Acid, Serum: 8.7 mg/dL — ABNORMAL HIGH (ref 2.5–7.1)

## 2024-06-04 NOTE — Progress Notes (Signed)
 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 (patient reports -planned for August 2017) 13q deletion does pre-dispose her to recurrent SCC  Current Treatment: observation  Previous treatment: IVIG for several months last winter to reduce recurrent respiratory infections (Patient notes that this helped) Has not required definitive treatment for CLL at this time and has been reluctant to consider treatment when discussed in the setting of thrombocytopenia.  INTERVAL HISTORY:  PERSEPHONIE HEGWOOD is a 78 y.o. female here for continued evaluation and management of [name].  Patient was last seen by me on 12/27/2022 and reported back pain, incident of fall, occasional mild constipation and mild fatigue.  She notes no new constitutional symptoms. Patient notes on new LNadenopathy, abdominal pain or distension.  REVIEW OF SYSTEMS:   10 Point review of Systems was done is negative except as noted above.   Past Medical History:  Diagnosis Date   Anemia    Anxiety    Arthritis    CLL (chronic lymphocytic leukemia) (HCC) dx'd ~ 2015   Depression    GERD (gastroesophageal reflux disease)    Headache    sinus headaches   History of kidney stones    Hypertension    OSA (obstructive sleep apnea)    Osteoarthritis    Pneumonia    Prediabetes    Recurrent sinus infections 08/02/2012   Tubular adenoma of colon      . Past Surgical History:  Procedure Laterality Date   ABDOMINAL HYSTERECTOMY  1980's   endometrosis   APPENDECTOMY     BREAST SURGERY     CATARACT EXTRACTION, BILATERAL     CHOLECYSTECTOMY  1985   FUNCTIONAL ENDOSCOPIC SINUS SURGERY  1990's   cause I kept having sinus infections   immunoglobulin treatment  2017   LAPAROSCOPIC APPENDECTOMY N/A 06/04/2014   Procedure: APPENDECTOMY LAPAROSCOPIC;  Surgeon: Dann FORBES Hummer, MD;  Location: [facility] OR;  Service: General;  Laterality: N/A;   LUMBAR LAMINECTOMY/DECOMPRESSION MICRODISCECTOMY Left 11/01/2017   Procedure: Left Lumbar Four-Five Extraforaminal Microdiscectomy;  Surgeon: Joshua Alm RAMAN, MD;  Location: [facility] OR;  Service: Neurosurgery;  Laterality: Left;   LYMPH NODE BIOPSY     determined I had CLL   SHOULDER ARTHROSCOPY Right    TEMPOROMANDIBULAR JOINT ARTHROPLASTY  1980's   TONSILLECTOMY AND ADENOIDECTOMY  1950's    . Social History   Tobacco Use   Smoking status: Former    Current packs/day: 0.00    Average packs/day: 0.8 packs/day for 4.0 years (3.0 ttl pk-yrs)    Types: Cigarettes    Start date: 12/05/1964    Quit date: 12/05/1968    Years since quitting: 55.5   Smokeless tobacco: Never  Vaping Use   Vaping status: Never Used  Substance Use Topics   Alcohol use: Yes    Alcohol/week: 7.0 standard drinks of alcohol    Types: 7 Glasses of wine per week   Drug use: No    ALLERGIES:  is allergic  to hyzaar [losartan potassium-hctz], sudafed [pseudoephedrine hcl], levaquin  [levofloxacin  in d5w], acyclovir and related, biaxin [clarithromycin], cantaloupe extract allergy skin test, celexa [citalopram], ciprofloxacin , flexeril [cyclobenzaprine], fosamax [alendronate sodium], gabapentin , iohexol , losartan, meloxicam, norvasc [amlodipine], other, pseudoephedrine, and zoloft [sertraline hcl].  MEDICATIONS:  Current Outpatient Medications  Medication Sig Dispense Refill   Ascorbic Acid  (VITAMIN C PO) 1500 mg by oral route.      buPROPion  (WELLBUTRIN  XL) 150 MG 24 hr tablet Take  1 tablet  every Morning for Mood / Depression, Focus  & Concentration 90 tablet 3   busPIRone  (BUSPAR ) 10 MG tablet Take 1 tablet  3  x / day for anxiety 270 tablet 3   Cholecalciferol  50 MCG (2000 UT) TABS 10,000 units     cloNIDine  (CATAPRES ) 0.1 MG tablet Take 1-2 tables PO; May repeart every 45-60 mins PRN up to a total of 4 doses until symptoms resolve.  Monitor BP 90 tablet 0   FLUoxetine  (PROZAC ) 20 MG capsule Take 1 capsule Daily for Mood, Focus & Concentration 90 capsule 3   hydrOXYzine  (ATARAX ) 10 MG tablet Take 1 tablet 4 x / day for Anxiety 360 tablet 3   levothyroxine  (SYNTHROID ) 50 MCG tablet TAKE 1 TABLET(50 MCG) BY MOUTH DAILY 30 tablet 11   montelukast  (SINGULAIR ) 10 MG tablet Take 1 tablet daily for Allergies & Asthma 90 tablet 3   pantoprazole  (PROTONIX ) 40 MG tablet Take  1 tablet   Daily   to Prevent Heartburn &  Indigestion 90 tablet 3   promethazine -dextromethorphan (PROMETHAZINE -DM) 6.25-15 MG/5ML syrup Take 1 tsp every 4 hours if needed for cough 473 mL 3   RESTASIS  0.05 % ophthalmic emulsion Place 1 drop into both eyes 2 (two) times daily as needed (for seasonal allergies/irritation).      rosuvastatin  (CRESTOR ) 20 MG tablet Take 1 tablet Daily for Cholesterol 90 tablet 3   No current facility-administered medications for this visit.    PHYSICAL EXAMINATION: ECOG PERFORMANCE STATUS: 1  Vitals:   06/03/24 1335  BP: (!) 159/76  Pulse: 69  Resp: 18  Temp: (!) 97.2 F (36.2 C)  SpO2: 94%     Filed Weights   06/03/24 1335  Weight: 202 lb 9.6 oz (91.9 kg)    .Body mass index is 37.06 kg/m.    GENERAL:alert, in no acute distress and comfortable SKIN: no acute rashes, no significant lesions EYES: conjunctiva are pink and non-injected, sclera anicteric OROPHARYNX: MMM, no exudates, no oropharyngeal erythema or ulceration NECK: supple, no JVD LYMPH:  no palpable lymphadenopathy in the cervical, axillary  or inguinal regions LUNGS: clear to auscultation b/l with normal respiratory effort HEART: regular rate & rhythm ABDOMEN:  normoactive bowel sounds , non tender, not distended. Extremity: no pedal edema PSYCH: alert & oriented x 3 with fluent speech NEURO: no focal motor/sensory deficits   LABORATORY DATA:      Latest Ref Rng & Units 06/03/2024    1:30 PM 08/31/2023    3:11 PM 08/01/2023    4:04 PM  CBC  WBC 4.0 - 10.5 K/uL 34.9  38.1  72.1   Hemoglobin 12.0 - 15.0 g/dL 87.6  88.9  88.8   Hematocrit 36.0 - 46.0 % 35.7  32.6  34.2   Platelets 150 - 400 K/uL 89  133  85    CBC    Component Value Date/Time   WBC 34.9 (H) 06/03/2024 1330   WBC 38.1 (H) 08/31/2023 1511   RBC 3.88 06/03/2024 1330   HGB 12.3 06/03/2024  1330   HGB 11.2 (L) 11/08/2017 1418   HCT 35.7 (L) 06/03/2024 1330   HCT 34.8 11/08/2017 1418   PLT 89 (L) 06/03/2024 1330   PLT 140 (L) 11/08/2017 1418   MCV 92.0 06/03/2024 1330   MCV 96.4 11/08/2017 1418   MCH 31.7 06/03/2024 1330   MCHC 34.5 06/03/2024 1330   RDW 12.9 06/03/2024 1330   RDW 14.8 (H) 11/08/2017 1418   LYMPHSABS 28.9 (H) 06/03/2024 1330   LYMPHSABS 87.0 (H) 11/08/2017 1418   MONOABS 1.2 (H) 06/03/2024 1330   MONOABS 1.5 (H) 11/08/2017 1418   EOSABS 0.2 06/03/2024 1330   EOSABS 0.6 (H) 11/08/2017 1418   BASOSABS 0.1 06/03/2024 1330   BASOSABS 0.2 (H) 11/08/2017 1418       Latest Ref Rng & Units 06/03/2024    1:30 PM 08/31/2023    3:11 PM 08/10/2023    2:01 PM  CMP  Glucose 70 - 99 mg/dL 882  91  896   BUN 8 - 23 mg/dL 15  12  11    Creatinine 0.44 - 1.00 mg/dL 9.16  9.00  9.09   Sodium 135 - 145 mmol/L 135  129  125   Potassium 3.5 - 5.1 mmol/L 4.0  4.1  3.9   Chloride 98 - 111 mmol/L 99  95  90   CO2 22 - 32 mmol/L 27  23  23    Calcium  8.9 - 10.3 mg/dL 89.9  9.0  9.7   Total Protein 6.5 - 8.1 g/dL 6.3  6.0  6.3   Total Bilirubin 0.0 - 1.2 mg/dL 0.9  0.7  0.9   Alkaline Phos 38 - 126 U/L 59     AST 15 - 41 U/L 21  19  20    ALT 0 - 44  U/L 13  14  18     . Lab Results  Component Value Date   LDH 170 06/03/2024       RADIOGRAPHIC STUDIES: I have personally reviewed the radiological images as listed and agreed with the findings in the report.  CT ABDOMEN AND PELVIS WITHOUT CONTRAST    IMPRESSION: 1. No acute abnormalities within the abdomen or pelvis. 2. Mild splenomegaly and several prominent to mildly enlarged pelvic and inguinal lymph nodes. These findings are similar to the prior CT. 3. Small nonobstructing stone in the lower pole the right kidney. No ureteral stones or obstructive uropathy.     Electronically Signed   By: Alm Parkins M.D.   On: 11/14/2016 16:39     ASSESSMENT & PLAN:   78 y.o.  caucasian female with   1. Rai Stage 2 previously - now Stage IV CLL with lymphocytosis, LNadenopathy and mild splenomegaly with anemia and thrombocytopenia. -patient has previously and continues to desire holding off CLL directed treatment as long as possible.   Lab Results  Component Value Date   LDH 170 06/03/2024   2.  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 Has been taking good infection prevention precautions. CLL initially diagnosed 2009 with 13q deletion. Previously  has been intermittent IVIG, clinically very helpful with decrease in respiratory infections.  Last imaging was CT AP 02-2015. --showed no evidence of Splenomegaly. Negative Hep B serology 2015  No overt new constitutional symptoms except mild unchanged fatigue. Has had significant recurrent headaches with IVIG - discontinued  3. Mild Anemia Hgb hemoglobin stable in the 10.5-11.3 range.  4. Moderate thrombocytopenia PLT count a little better at 119k  5. H/o  Recurrent cutaneous SCC s/p Mohs  surgery for SCC of the nosehealed. No issues with infection-   her 13q deletion places her at risk for recurrent SCC. -continue close f/u with dermatologist for evaluation and management of non melanoma skin cancers that can be increased in  patient with CLL with 13q deletion.  PLAN:  -Discussed lab results on 06/03/2024  in detail with patient. CBC showed WBC of 34.9K, hemoglobin of 12.3, and platelets of 89K. -uric acid 8.7 mg/dL -LDH 829 U/L -patient has some stable thrombocytopenia. No new constitutional symptoms. No clear labs or clinical evidence of significant symptomatic disease progression at this time.  -no clear indication to initiate  CLL treatment at this time. Even previously with lower PLT counts the patient has been hesitant to consider CLL treatment.    FOLLOW-UP: RTC with Dr Onesimo with labs in 6 months  The total time spent in the appointment was 21 minutes* .  All of the patient's questions were answered with apparent satisfaction. The patient knows to call the clinic with any problems, questions or concerns.   Emaline Onesimo MD MS AAHIVMS [facility] Hematology/Oncology Physician [facility]  .*Total Encounter Time as defined by the Centers for Medicare and Medicaid Services includes, in addition to the face-to-face time of a patient visit (documented in the note above) non-face-to-face time: obtaining and reviewing outside history, ordering and reviewing medications, tests or procedures, care coordination (communications with other health care professionals or caregivers) and documentation in the medical record.    I,Mitra Faeizi,acting as a Neurosurgeon for Emaline Onesimo, MD.,have documented all relevant documentation on the behalf of Emaline Onesimo, MD,as directed by  Emaline Onesimo, MD while in the presence of Emaline Onesimo, MD.  .I have reviewed the above documentation for accuracy and completeness, and I agree with the above. .Elizabeth Paulsen Kishore Anett Ranker MD

## 2024-06-05 ENCOUNTER — Telehealth: Payer: Self-pay | Admitting: Hematology

## 2024-06-05 NOTE — Telephone Encounter (Signed)
 Spoke with patient confirming upcoming appointment

## 2024-06-10 ENCOUNTER — Ambulatory Visit: Payer: Medicare Other | Admitting: Nurse Practitioner

## 2024-06-12 NOTE — Progress Notes (Unsigned)
 06/12/2024 MEAGHAN WHISTLER [id number] Jun 16, 1946  Referring provider: Tonita Fallow, MD Primary GI doctor: {acdocs:[id number]}  ASSESSMENT AND PLAN:  Abdominal pain  GERD 01/28/2022 RUQ US  fatty liver borderline splenomegaly Status post cholecystectomy 1992 08/19/2020 EGD Zenker's diverticulum, 3 cm hiatal hernia, erythematous mucosa, negative H. pylori gastritis  CLL  Personal history of adenomatous colon polyps 08/19/2020 colonoscopy good prep 6 mm and 8 mm SS polyp Recall 08/19/2025  OSA  Obesity  Fatty liver  US  01/28/2022 with borderline splenomegaly/thrombocytopenia    Latest Ref Rng & Units 06/03/2024    1:30 PM 08/31/2023    3:11 PM 08/10/2023    2:01 PM  Hepatic Function  Total Protein 6.5 - 8.1 g/dL 6.3  6.0  6.3   Albumin 3.5 - 5.0 g/dL 4.5     AST 15 - 41 U/L 21  19  20    ALT 0 - 44 U/L 13  14  18    Alk Phosphatase 38 - 126 U/L 59     Total Bilirubin 0.0 - 1.2 mg/dL 0.9  0.7  0.9    Platelets 89 in setting of CLL but very concerning for cirrhosis INR 12/12/2022 1.0  FIB 4 *** Patients less than 1.45% are low risk, counsel and monitor.  Patients above 3.25% need referral for evaluation.  Patients between 1.45 and 3.25 can get fibroscan/evaluate.  - repeat imaging with US  elastography - need LFTs and CBC monitored every 6 months, - evaluation with imaging every 2-3 years.  -Continue to work on risk factor modification including diet exercise and control of risk factors including blood sugars. - cessation of alcohol   Patient Care Team: Tonita Fallow, MD as PCP - General Burnard Debby LABOR, MD as PCP - Cardiology (Cardiology) Onesimo Emaline Brink, MD as Consulting Physician (Hematology) Octavia Bruckner, MD as Consulting Physician (Ophthalmology) Pyrtle, Gordy HERO, MD as Consulting Physician (Gastroenterology)  HISTORY OF PRESENT ILLNESS: 78 y.o. female with a past medical history listed below presents for evaluation of ***.   Patient was last seen in  the office 07/06/2020 by Dr. Albertus for abdominal pain.  *** Discussed the use of AI scribe software for clinical note transcription with the patient, who gave verbal consent to proceed.  History of Present Illness            She  reports that she quit smoking about 55 years ago. Her smoking use included cigarettes. She started smoking about 59 years ago. She has a 3 pack-year smoking history. She has never used smokeless tobacco. She reports current alcohol use of about 7.0 standard drinks of alcohol per week. She reports that she does not use drugs.  RELEVANT GI HISTORY, IMAGING AND LABS: Results          CBC    Component Value Date/Time   WBC 34.9 (H) 06/03/2024 1330   WBC 38.1 (H) 08/31/2023 1511   RBC 3.88 06/03/2024 1330   HGB 12.3 06/03/2024 1330   HGB 11.2 (L) 11/08/2017 1418   HCT 35.7 (L) 06/03/2024 1330   HCT 34.8 11/08/2017 1418   PLT 89 (L) 06/03/2024 1330   PLT 140 (L) 11/08/2017 1418   MCV 92.0 06/03/2024 1330   MCV 96.4 11/08/2017 1418   MCH 31.7 06/03/2024 1330   MCHC 34.5 06/03/2024 1330   RDW 12.9 06/03/2024 1330   RDW 14.8 (H) 11/08/2017 1418   LYMPHSABS 28.9 (H) 06/03/2024 1330   LYMPHSABS 87.0 (H) 11/08/2017 1418   MONOABS 1.2 (H) 06/03/2024 1330  MONOABS 1.5 (H) 11/08/2017 1418   EOSABS 0.2 06/03/2024 1330   EOSABS 0.6 (H) 11/08/2017 1418   BASOSABS 0.1 06/03/2024 1330   BASOSABS 0.2 (H) 11/08/2017 1418   Recent Labs    08/01/23 1604 08/31/23 1511 06/03/24 1330  HGB 11.1* 11.0* 12.3    CMP     Component Value Date/Time   NA 135 06/03/2024 1330   NA 134 (L) 11/08/2017 1418   K 4.0 06/03/2024 1330   K 4.1 11/08/2017 1418   CL 99 06/03/2024 1330   CL 102 08/10/2012 1455   CO2 27 06/03/2024 1330   CO2 23 11/08/2017 1418   GLUCOSE 117 (H) 06/03/2024 1330   GLUCOSE 96 11/08/2017 1418   GLUCOSE 107 (H) 08/10/2012 1455   BUN 15 06/03/2024 1330   BUN 14.0 11/08/2017 1418   CREATININE 0.83 06/03/2024 1330   CREATININE 0.99 08/31/2023  1511   CREATININE 0.9 11/08/2017 1418   CALCIUM  10.0 06/03/2024 1330   CALCIUM  9.7 11/08/2017 1418   PROT 6.3 (L) 06/03/2024 1330   PROT 6.9 11/08/2017 1418   ALBUMIN 4.5 06/03/2024 1330   ALBUMIN 4.2 11/08/2017 1418   AST 21 06/03/2024 1330   AST 24 11/08/2017 1418   ALT 13 06/03/2024 1330   ALT 23 11/08/2017 1418   ALKPHOS 59 06/03/2024 1330   ALKPHOS 62 11/08/2017 1418   BILITOT 0.9 06/03/2024 1330   BILITOT 1.05 11/08/2017 1418   GFRNONAA >60 06/03/2024 1330   GFRNONAA 49 (L) 05/13/2021 1340   GFRAA 57 (L) 05/13/2021 1340      Latest Ref Rng & Units 06/03/2024    1:30 PM 08/31/2023    3:11 PM 08/10/2023    2:01 PM  Hepatic Function  Total Protein 6.5 - 8.1 g/dL 6.3  6.0  6.3   Albumin 3.5 - 5.0 g/dL 4.5     AST 15 - 41 U/L 21  19  20    ALT 0 - 44 U/L 13  14  18    Alk Phosphatase 38 - 126 U/L 59     Total Bilirubin 0.0 - 1.2 mg/dL 0.9  0.7  0.9       Current Medications:   Current Outpatient Medications (Endocrine & Metabolic):    levothyroxine  (SYNTHROID ) 50 MCG tablet, TAKE 1 TABLET(50 MCG) BY MOUTH DAILY  Current Outpatient Medications (Cardiovascular):    cloNIDine  (CATAPRES ) 0.1 MG tablet, Take 1-2 tables PO; May repeart every 45-60 mins PRN up to a total of 4 doses until symptoms resolve.  Monitor BP   rosuvastatin  (CRESTOR ) 20 MG tablet, Take 1 tablet Daily for Cholesterol  Current Outpatient Medications (Respiratory):    montelukast  (SINGULAIR ) 10 MG tablet, Take 1 tablet daily for Allergies & Asthma   promethazine -dextromethorphan (PROMETHAZINE -DM) 6.25-15 MG/5ML syrup, Take 1 tsp every 4 hours if needed for cough    Current Outpatient Medications (Other):    Ascorbic Acid  (VITAMIN C PO), 1500 mg by oral route.   buPROPion  (WELLBUTRIN  XL) 150 MG 24 hr tablet, Take  1 tablet  every Morning for Mood / Depression, Focus  & Concentration   busPIRone  (BUSPAR ) 10 MG tablet, Take 1 tablet  3  x / day for anxiety   Cholecalciferol  50 MCG (2000 UT) TABS, 10,000  units   FLUoxetine  (PROZAC ) 20 MG capsule, Take 1 capsule Daily for Mood, Focus & Concentration   hydrOXYzine  (ATARAX ) 10 MG tablet, Take 1 tablet 4 x / day for Anxiety   pantoprazole  (PROTONIX ) 40 MG tablet, Take  1  tablet   Daily   to Prevent Heartburn &  Indigestion   RESTASIS  0.05 % ophthalmic emulsion, Place 1 drop into both eyes 2 (two) times daily as needed (for seasonal allergies/irritation).   Medical History:  Past Medical History:  Diagnosis Date   Anemia    Anxiety    Arthritis    CLL (chronic lymphocytic leukemia) (HCC) dx'd ~ 2015   Depression    GERD (gastroesophageal reflux disease)    Headache    sinus headaches   History of kidney stones    Hypertension    OSA (obstructive sleep apnea)    Osteoarthritis    Pneumonia    Prediabetes    Recurrent sinus infections 08/02/2012   Tubular adenoma of colon    Allergies:  Allergies  Allergen Reactions   Hyzaar [Losartan Potassium-Hctz] Swelling and Other (See Comments)    messed up my sodium countsand swelled lips   Sudafed [Pseudoephedrine Hcl] Palpitations   Levaquin  [Levofloxacin  In D5w] Diarrhea and Nausea Only   Acyclovir And Related Other (See Comments)    Reaction not recalled   Biaxin [Clarithromycin]     GI Upset   Cantaloupe Extract Allergy Skin Test Other (See Comments)    Nasal congestion, headaches, and sneezing   Celexa [Citalopram] Other (See Comments)    Reaction not recalled   Ciprofloxacin  Nausea And Vomiting   Flexeril [Cyclobenzaprine]     zombie-like feeling   Fosamax [Alendronate Sodium]     GI upset   Gabapentin      confusion   Iohexol  Hives and Other (See Comments)    Patient stated she broke out in hive 20 yrs ago from IV contrast      Losartan Swelling and Other (See Comments)    Angioedema   Meloxicam     GI upset   Norvasc [Amlodipine] Swelling and Other (See Comments)    Ankles swell   Other Nausea Only and Other (See Comments)    Iceburg lettuce- made me dreadfully  nauseous Raw mushrooms only-  made me dreadfully nauseous (cooked ones are tolerated)   Pseudoephedrine     Palpitations   Zoloft [Sertraline Hcl] Other (See Comments)    Has no emotions at all      Surgical History:  She  has a past surgical history that includes Cholecystectomy (1985); Tonsillectomy and adenoidectomy (1950's); Breast surgery; Lymph node biopsy; Temporomandibular joint arthroplasty (1980's); Functional endoscopic sinus surgery (1990's); Abdominal hysterectomy (1980's); laparoscopic appendectomy (N/A, 06/04/2014); immunoglobulin treatment (2017); Appendectomy; Shoulder arthroscopy (Right); Cataract extraction, bilateral; and Lumbar laminectomy/decompression microdiscectomy (Left, 11/01/2017). Family History:  Her family history includes Arthritis in her brother; Heart attack in her brother and maternal grandmother; Hypertension in her maternal grandmother and mother; Parkinson's disease in her mother.  REVIEW OF SYSTEMS  : All other systems reviewed and negative except where noted in the History of Present Illness.  PHYSICAL EXAM: There were no vitals taken for this visit. Physical Exam          Alan JONELLE Coombs, PA-C 8:11 AM

## 2024-06-13 ENCOUNTER — Encounter: Payer: Self-pay | Admitting: Physician Assistant

## 2024-06-13 ENCOUNTER — Ambulatory Visit: Admitting: Physician Assistant

## 2024-06-13 VITALS — BP 120/72 | HR 62 | Ht 62.0 in | Wt 202.5 lb

## 2024-06-13 DIAGNOSIS — C9110 Chronic lymphocytic leukemia of B-cell type not having achieved remission: Secondary | ICD-10-CM

## 2024-06-13 DIAGNOSIS — R1013 Epigastric pain: Secondary | ICD-10-CM

## 2024-06-13 DIAGNOSIS — K219 Gastro-esophageal reflux disease without esophagitis: Secondary | ICD-10-CM | POA: Diagnosis not present

## 2024-06-13 DIAGNOSIS — R197 Diarrhea, unspecified: Secondary | ICD-10-CM

## 2024-06-13 DIAGNOSIS — K760 Fatty (change of) liver, not elsewhere classified: Secondary | ICD-10-CM | POA: Diagnosis not present

## 2024-06-13 DIAGNOSIS — Z860101 Personal history of adenomatous and serrated colon polyps: Secondary | ICD-10-CM

## 2024-06-13 DIAGNOSIS — D696 Thrombocytopenia, unspecified: Secondary | ICD-10-CM

## 2024-06-13 DIAGNOSIS — Z6837 Body mass index (BMI) 37.0-37.9, adult: Secondary | ICD-10-CM

## 2024-06-13 MED ORDER — PANTOPRAZOLE SODIUM 40 MG PO TBEC: 40.0000 mg | DELAYED_RELEASE_TABLET | Freq: Every day | ORAL | 3 refills | Status: AC

## 2024-06-13 MED ORDER — FAMOTIDINE 40 MG PO TABS: 40.0000 mg | ORAL_TABLET | Freq: Every day | ORAL | 0 refills | Status: DC

## 2024-06-13 NOTE — Patient Instructions (Signed)
 _______________________________________________________  If your blood pressure at your visit was 140/90 or greater, please contact your primary care physician to follow up on this.  _______________________________________________________  If you are age 78 or older, your body mass index should be between 23-30. Your Body mass index is 37.04 kg/m. If this is out of the aforementioned range listed, please consider follow up with your Primary Care Provider.  If you are age 64 or younger, your body mass index should be between 19-25. Your Body mass index is 37.04 kg/m. If this is out of the aformentioned range listed, please consider follow up with your Primary Care Provider.   ________________________________________________________  The [facility] providers would like to encourage you to use MYCHART to communicate with providers for non-urgent requests or questions.  Due to long hold times on the telephone, sending your provider a message by [facility] may be a faster and more efficient way to get a response.  Please allow 48 business hours for a response.  Please remember that this is for non-urgent requests.  _______________________________________________________  Rebecca Bond have been scheduled for an appointment with Alan Coombs PA-C on 07-22-24 at 230pm . Please arrive 10 minutes early for your appointment.  You have been scheduled for an abdominal ultrasound at [facility] (1st floor of hospital) on 06-21-24 at 11am. Please arrive 30 minutes prior to your appointment for registration. Make certain not to have anything to eat or drink midnight prior to your appointment. Should you need to reschedule your appointment, please contact radiology at [phone]. This test typically takes about 30 minutes to perform.  Please take your proton pump inhibitor medication, pantoprazole  40 mg once a day, and famotidine  at night 40 mg nightly  Please take this medication 30 minutes to 1 hour before  meals- this makes it more effective.  Avoid spicy and acidic foods Avoid fatty foods Limit your intake of coffee, tea, alcohol, and carbonated drinks Work to maintain a healthy weight Keep the head of the bed elevated at least 3 inches with blocks or a wedge pillow if you are having any nighttime symptoms Stay upright for 2 hours after eating Avoid meals and snacks three to four hours before bedtime  I'm a fan of [name], NP with [place] [facility] [employer], [name], NP at [facility], and [name], [name] at [facility].   VISIT SUMMARY:  You came in today with concerns about right upper quadrant pain and bloating that you've been experiencing since February. We discussed your history of hiatal hernia, fatty liver, and your current medications. We also talked about your alcohol consumption and its potential impact on your symptoms.  YOUR PLAN:  -GASTRITIS: Gastritis is inflammation of the stomach lining, which can cause pain, bloating, and nausea. We will start you on pantoprazole  once daily in the morning to reduce stomach acid. An abdominal ultrasound with elastography will be done to assess your liver and spleen. Reducing your alcohol intake is also advised.  -FATTY LIVER: Fatty liver is a condition where fat builds up in the liver, which can lead to liver damage. We will perform an abdominal ultrasound with elastography to further evaluate your liver.  -DIARRHEA: Your intermittent diarrhea may be related to alcohol consumption. Reducing your alcohol intake is recommended. You may also consider taking a fiber supplement like Benefiber to help manage your morning diarrhea.  -CHRONIC LYMPHOCYTIC LEUKEMIA (CLL): CLL is a type of cancer that affects the blood and bone marrow, leading  to an increased number of white blood cells. You will continue to follow up with your hematologist for ongoing management.  -SPLENOMEGALY: Splenomegaly is an enlargement  of the spleen, which in your case is likely due to CLL. No specific treatment is needed at this time.  -THROMBOCYTOPENIA: Thrombocytopenia is a condition where you have a lower than normal number of platelets in your blood, often associated with CLL. There are no signs of acute bleeding or bruising, so no immediate treatment is required.  -DEPRESSION AND ANXIETY: Your depression is being managed with fluoxetine , and you are using clonidine  as needed for anxiety. Clonidine  can cause drowsiness and affect blood pressure. Hydroxyzine  can be considered for anxiety but may cause drowsiness. Buspar  can take 2-3 x a day regularly. Continue hydroxizine as needed and use clonidine  with caution. Follow up with PCP  INSTRUCTIONS:  Please follow up with your hematologist as scheduled for your CLL. We will also schedule an abdominal ultrasound with elastography to assess your liver and spleen. Reduce your alcohol intake to help manage your symptoms. Continue taking fluoxetine  as prescribed and use clonidine  as needed for anxiety, being mindful of its effects on blood pressure. Consider using hydroxyzine  for anxiety if needed, but avoid Buspar .

## 2024-06-21 ENCOUNTER — Ambulatory Visit (HOSPITAL_COMMUNITY)

## 2024-06-26 ENCOUNTER — Ambulatory Visit (HOSPITAL_COMMUNITY)

## 2024-07-04 ENCOUNTER — Ambulatory Visit (HOSPITAL_COMMUNITY)

## 2024-07-11 DIAGNOSIS — M170 Bilateral primary osteoarthritis of knee: Secondary | ICD-10-CM | POA: Diagnosis not present

## 2024-07-11 DIAGNOSIS — M1712 Unilateral primary osteoarthritis, left knee: Secondary | ICD-10-CM | POA: Diagnosis not present

## 2024-07-11 DIAGNOSIS — M1711 Unilateral primary osteoarthritis, right knee: Secondary | ICD-10-CM | POA: Diagnosis not present

## 2024-07-15 ENCOUNTER — Ambulatory Visit (HOSPITAL_COMMUNITY)

## 2024-07-16 DIAGNOSIS — K080 Exfoliation of teeth due to systemic causes: Secondary | ICD-10-CM | POA: Diagnosis not present

## 2024-07-21 DIAGNOSIS — S92354A Nondisplaced fracture of fifth metatarsal bone, right foot, initial encounter for closed fracture: Secondary | ICD-10-CM | POA: Diagnosis not present

## 2024-07-21 DIAGNOSIS — S92344A Nondisplaced fracture of fourth metatarsal bone, right foot, initial encounter for closed fracture: Secondary | ICD-10-CM | POA: Diagnosis not present

## 2024-07-22 ENCOUNTER — Ambulatory Visit: Admitting: Physician Assistant

## 2024-07-26 DIAGNOSIS — S92344A Nondisplaced fracture of fourth metatarsal bone, right foot, initial encounter for closed fracture: Secondary | ICD-10-CM | POA: Diagnosis not present

## 2024-08-08 ENCOUNTER — Ambulatory Visit: Admitting: Physician Assistant

## 2024-08-14 DIAGNOSIS — S92344D Nondisplaced fracture of fourth metatarsal bone, right foot, subsequent encounter for fracture with routine healing: Secondary | ICD-10-CM | POA: Diagnosis not present

## 2024-08-22 ENCOUNTER — Telehealth: Payer: Self-pay | Admitting: *Deleted

## 2024-08-22 NOTE — Telephone Encounter (Signed)
 Copied from CRM #[id number]. Topic: General - Other >> Aug 16, 2024 12:32 PM Harlene ORN wrote: Reason for CRM: Just called to check if Rebecca Bond is accepting new patients. Told her yes and said she will call back to let her husband know. >> Aug 22, 2024  3:24 PM Franky GRADE wrote: Patient would like to schedule an appointment with Rebecca Bond but did not want me to do it, she would prefer to speak to the office directly and is requesting a call back.

## 2024-08-23 ENCOUNTER — Telehealth: Payer: Self-pay | Admitting: Physician Assistant

## 2024-08-23 NOTE — Telephone Encounter (Signed)
 Patient requesting f/u call in regards to plan of care. Please advise.   Thank you

## 2024-08-23 NOTE — Telephone Encounter (Signed)
 Called and spoke with patient. Patient rescheduled her OV to 09/23/24 and wanted to know how far in advance she would need to have her US  completed. I advised patient that she would need to have US  scheduled at least 2 weeks before her appt because radiology has been delayed with reports. Patient also concerned about getting back to US  appt because she broke her foot and is not able to walk long distance. I informed patient that someone will be able to assist in transporting her to radiology when she arrives at the hospital. Advised patient to just ask for assistance. Patient verbalized understanding and had no concerns at the end of the call.

## 2024-08-26 ENCOUNTER — Telehealth: Payer: Self-pay | Admitting: Physician Assistant

## 2024-08-26 NOTE — Telephone Encounter (Signed)
 Good Afternoon Alan Coombs,  Patient calling wanting to discuss the recommendations you gave her of primary care providers. Patient stated Dr.Gray wasn't available until end of December and other provider wasn't available until April 2026. Patient stated she found Dr.Morrison and he had an opening for December 1st Patient would like to know if she should take appointment. Please advise  Thank you

## 2024-08-27 NOTE — Telephone Encounter (Signed)
 Called and informed patient. Patient verbalized understanding.

## 2024-08-30 ENCOUNTER — Ambulatory Visit: Admitting: Physician Assistant

## 2024-09-09 ENCOUNTER — Other Ambulatory Visit: Payer: Self-pay | Admitting: Physician Assistant

## 2024-09-11 DIAGNOSIS — S92344D Nondisplaced fracture of fourth metatarsal bone, right foot, subsequent encounter for fracture with routine healing: Secondary | ICD-10-CM | POA: Diagnosis not present

## 2024-09-12 ENCOUNTER — Encounter: Payer: Medicare Other | Admitting: Internal Medicine

## 2024-09-20 ENCOUNTER — Ambulatory Visit: Admitting: Physician Assistant

## 2024-09-23 ENCOUNTER — Ambulatory Visit: Admitting: Physician Assistant

## 2024-10-06 ENCOUNTER — Ambulatory Visit
Admission: RE | Admit: 2024-10-06 | Discharge: 2024-10-06 | Disposition: A | Discharge status: Home / Self Care | Source: Ambulatory Visit | Attending: Family Medicine | Admitting: Family Medicine

## 2024-10-06 VITALS — BP 186/87 | HR 65 | Temp 98.6°F | Resp 18

## 2024-10-06 DIAGNOSIS — R002 Palpitations: Secondary | ICD-10-CM

## 2024-10-06 NOTE — ED Provider Notes (Addendum)
 [facility]    CSN: [id number] Arrival date & time: 10/06/24  1346      History   Chief Complaint Chief Complaint  Patient presents with   Allergic Reaction    Entered by patient    HPI Rebecca Bond is a 78 y.o. female with a past medical history of anxiety and hypertension presents for possible reaction to medication.  Patient reports she takes clonidine  0.1 mg as needed for her anxiety.  She says she takes it maybe twice a week but tries not to take it often.  She also states it can make her drowsy so she has been having some insomnia so about 5 or 6 AM this morning she decided to take 1 to see if it could help her sleep.  She states shortly after she developed some palpitations that she describes as her heart was racing.  It lasted about 20 minutes and resolved on its own after she did some deep breathing.  She denies any chest pain, shortness of breath, dizziness, syncope, nausea or vomiting.  She does have a history of panic attacks and states the palpitations she experienced were similar to that.  She has never had this reaction to the clonidine  in the past.  She called her PCP on-call and they told her to go to urgent care to get checked out.  She has had no additional episodes of palpitations and states she feels fine.   Allergic Reaction   Past Medical History:  Diagnosis Date   Anemia    Anxiety    Arthritis    CLL (chronic lymphocytic leukemia) (HCC) dx'd ~ 2015   Depression    GERD (gastroesophageal reflux disease)    Headache    sinus headaches   History of kidney stones    Hypertension    OSA (obstructive sleep apnea)    Osteoarthritis    Pneumonia    Prediabetes    Recurrent sinus infections 08/02/2012   Tubular adenoma of colon     Patient Active Problem List   Diagnosis Date Noted   Hypothyroidism 11/15/2022   Essential hypertension 11/15/2022   Fatty liver 02/01/2022   Pneumonia due to COVID-19 virus 09/08/2021   Norovirus  04/10/2020   AKI (acute kidney injury) 04/08/2020   Anxiety with depression 04/08/2020   Hyperbilirubinemia 04/08/2020   Hypocalcemia 04/08/2020   Hypomagnesemia 04/08/2020   Chronic bronchitis, unspecified chronic bronchitis type (HCC) 03/12/2020   Atherosclerosis of aorta (HCC) by CT abd on 04/08/2020 03/12/2020   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0 to 37.9 in adult 12/17/2013   Hepatitis A 08/02/2012   Recurrent sinus infections 08/02/2012   CLL (chronic lymphocytic leukemia) (HCC) 11/22/2011    Past Surgical History:  Procedure Laterality Date   ABDOMINAL HYSTERECTOMY  1980's   endometrosis   APPENDECTOMY     BREAST SURGERY     CATARACT EXTRACTION, BILATERAL  CHOLECYSTECTOMY  1985   FUNCTIONAL ENDOSCOPIC SINUS SURGERY  1990's   cause I kept having sinus infections   immunoglobulin treatment  2017   LAPAROSCOPIC APPENDECTOMY N/A 06/04/2014   Procedure: APPENDECTOMY LAPAROSCOPIC;  Surgeon: Dann FORBES Hummer, MD;  Location: [facility];  Service: General;  Laterality: N/A;   LUMBAR LAMINECTOMY/DECOMPRESSION MICRODISCECTOMY Left 11/01/2017   Procedure: Left Lumbar Four-Five Extraforaminal Microdiscectomy;  Surgeon: Joshua Alm RAMAN, MD;  Location: [facility] OR;  Service: Neurosurgery;  Laterality: Left;   LYMPH NODE BIOPSY     determined I had CLL   SHOULDER ARTHROSCOPY Right     TEMPOROMANDIBULAR JOINT ARTHROPLASTY  1980's   TONSILLECTOMY AND ADENOIDECTOMY  1950's    OB History   No obstetric history on file.      Home Medications    Prior to Admission medications   Medication Sig Start Date End Date Taking? Authorizing Provider  Ascorbic Acid  (VITAMIN C PO) 1500 mg by oral route.    [provider]  bisoprolol -hydrochlorothiazide  (ZIAC ) 10-6.25 MG tablet Take 1 tablet by mouth every morning. 04/16/24   [provider]  busPIRone  (BUSPAR ) 10 MG tablet Take 1 tablet  3  x / day for anxiety 12/13/23   Tonita Fallow, MD  Cholecalciferol  50 MCG (2000 UT) TABS 10,000 units    [provider]  cloNIDine  (CATAPRES ) 0.1 MG tablet Take 1-2 tables PO; May repeart every 45-60 mins PRN up to a total of 4 doses until symptoms resolve.  Monitor BP 01/16/24   Douglass Caul B, FNP  famotidine  (PEPCID ) 40 MG tablet TAKE 1 TABLET BY MOUTH EVERYDAY AT BEDTIME 09/09/24   Collier, Amanda R, PA-C  FLUoxetine  (PROZAC ) 20 MG capsule Take 1 capsule Daily for Mood, Focus & Concentration 12/26/23   Tonita Fallow, MD  hydrOXYzine  (ATARAX ) 10 MG tablet Take 1 tablet 4 x / day for Anxiety 12/13/23   Tonita Fallow, MD  levothyroxine  (SYNTHROID ) 50 MCG tablet TAKE 1 TABLET(50 MCG) BY MOUTH DAILY 06/06/23   Wilkinson, Dana E, NP  pantoprazole  (PROTONIX ) 40 MG tablet Take 1 tablet (40 mg total) by mouth daily. 06/13/24   Craig Alan SAUNDERS, PA-C  promethazine -dextromethorphan (PROMETHAZINE -DM) 6.25-15 MG/5ML syrup Take 1 tsp every 4 hours if needed for cough 11/17/23   Tonita Fallow, MD  RESTASIS  0.05 % ophthalmic emulsion Place 1 drop into both eyes 2 (two) times daily as needed (for seasonal allergies/irritation).  08/19/19   [provider]    Family History Family History  Problem Relation Age of Onset   Parkinson's disease Mother    Hypertension Mother    Hypertension Maternal Grandmother    Heart attack Maternal Grandmother        Mild   Heart  attack Brother    Arthritis Brother     Social History Social History   Tobacco Use   Smoking status: Former    Current packs/day: 0.00    Average packs/day: 0.8 packs/day for 4.0 years (3.0 ttl pk-yrs)    Types: Cigarettes    Start date: 12/05/1964    Quit date: 12/05/1968    Years since quitting: 55.8   Smokeless tobacco: Never  Vaping Use   Vaping status: Never Used  Substance Use Topics   Alcohol use: Yes    Alcohol/week: 7.0 standard drinks of alcohol    Types: 7 Glasses of wine per week   Drug use: No     Allergies   Losartan potassium-hctz, Sudafed [pseudoephedrine hcl], Levaquin  [levofloxacin  in d5w], Acyclovir and related,  Alendronate, Biaxin [clarithromycin], Cantaloupe extract allergy skin test, Celexa [citalopram], Ciprofloxacin , Cyclobenzaprine, Fosamax [alendronate sodium], Gabapentin , Iohexol , Losartan, Meloxicam, Norvasc [amlodipine], Other, Pseudoephedrine, and Zoloft [sertraline hcl]   Review of Systems Review of Systems  Cardiovascular:  Positive for palpitations.     Physical Exam Triage Vital Signs ED Triage Vitals  Encounter Vitals Group     BP 10/06/24 1433 (!) 190/71     Girls Systolic BP Percentile --      Girls Diastolic BP Percentile --      Boys Systolic BP Percentile --      Boys Diastolic BP Percentile --      Pulse Rate 10/06/24 1433 65     Resp 10/06/24 1433 18     Temp 10/06/24 1433 98.6 F (37 C)     Temp Source 10/06/24 1433 Oral     SpO2 10/06/24 1433 93 %     Weight --      Height --      Head Circumference --      Peak Flow --      Pain Score 10/06/24 1436 0     Pain Loc --      Pain Education --      Exclude from Growth Chart --    No data found.  Updated Vital Signs BP (!) 186/87 (BP Location: Left Arm)   Pulse 65   Temp 98.6 F (37 C) (Oral)   Resp 18   SpO2 93%   Visual Acuity Right Eye Distance:   Left Eye Distance:   Bilateral Distance:    Right Eye Near:   Left Eye Near:    Bilateral Near:      Physical Exam Vitals and nursing note reviewed.  Constitutional:      General: She is not in acute distress.    Appearance: Normal appearance. She is not ill-appearing.  HENT:     Head: Normocephalic and atraumatic.  Eyes:     Pupils: Pupils are equal, round, and reactive to light.  Cardiovascular:     Rate and Rhythm: Normal rate and regular rhythm.     Heart sounds: Normal heart sounds.  Pulmonary:     Effort: Pulmonary effort is normal.     Breath sounds: Normal breath sounds.  Neurological:     General: No focal deficit present.     Mental Status: She is alert and oriented to person, place, and time.  Psychiatric:        Mood and Affect: Mood is anxious.        Speech: Speech normal.      UC Treatments / Results  Labs (all labs ordered are listed, but only abnormal results are displayed) Labs Reviewed - No data to display  EKG   Radiology No results found.  Procedures ED EKG  Date/Time: 10/06/2024 3:11 PM  Performed by: Loreda Myla SAUNDERS, NP Authorized by: Loreda Myla SAUNDERS, NP   ECG interpreted by ED Physician in the absence of a cardiologist: no   Previous ECG:    Previous ECG:  Compared to current   Comparison ECG info:  Similar to previous EKGs Rate:    ECG rate:  57   ECG rate assessment: bradycardic   Rhythm:    Rhythm: sinus bradycardia   Ectopy:    Ectopy: none   QRS:    QRS axis:  Normal   QRS conduction: normal   ST segments:    ST segments:  Non-specific T waves:    T  waves: normal   Q waves:    Abnormal Q-waves: not present    (including critical care time)  Medications Ordered in UC Medications - No data to display  Initial Impression / Assessment and Plan / UC Course  I have reviewed the triage vital signs and the nursing notes.  Pertinent labs & imaging results that were available during my care of the patient were reviewed by me and considered in my medical decision making (see chart for details).     I reviewed exam and  symptoms with patient.  No ectopy or arrhythmias on EKG and is similar to previous EKGs.  Patient currently has no symptoms including chest pain, shortness of breath, palpitations, dizziness.  States she feels fine.  Advised her to follow-up with her PCP at her scheduled appointment on the 11/7 regarding her clonidine .  This is a as needed medication and she only takes about twice a week per her report.  I did advise her if she has any repeat episodes of palpitations or any other concerns, red flags reviewed, she is to go to the emergency room for further evaluation.  Patient verbalized understanding these instructions. Final Clinical Impressions(s) / UC Diagnoses   Final diagnoses:  Palpitations     Discharge Instructions      Follow-up with your PCP at your scheduled appointment on 11/7 regarding your as needed clonidine .  Please go to the ER if you develop any additional episodes of palpitations and/or chest pain, shortness of breath, dizziness.  I hope you feel better soon!     ED Prescriptions   None    PDMP not reviewed this encounter.   Loreda Myla SAUNDERS, NP 10/06/24 1517    Loreda Myla SAUNDERS, NP 10/06/24 [phone]

## 2024-10-06 NOTE — ED Triage Notes (Signed)
 Pt reports he heart rate was high after taking Clonidine  this morning. States her PCP office told her to come to the UC to check. Pt reports sh\e feels ok, denies any headache, chest pain, dizziness, sweating, nausea, vision changes, vomiting, diarrhea, abdominal pain.

## 2024-10-06 NOTE — Discharge Instructions (Addendum)
 Follow-up with your PCP at your scheduled appointment on 11/7 regarding your as needed clonidine .  Please go to the ER if you develop any additional episodes of palpitations and/or chest pain, shortness of breath, dizziness.  I hope you feel better soon!

## 2024-10-07 ENCOUNTER — Telehealth: Payer: Self-pay | Admitting: Internal Medicine

## 2024-10-07 NOTE — Telephone Encounter (Signed)
 Pt was seen at [facility] on 10/06/24  Patient Name First: Rebecca Last: Bond Gender: Female DOB: Feb 21, 1946 Age: 78 Y 11 M 22 D Return Phone Number: [phone] (Primary) Address: City/ State/ Zip: [place]  [address] Client [facility] [employer] Provider Jesus Motto- MD Contact Type Call Who Is Calling Patient / Member / Family / Caregiver Call Type Triage / Clinical Relationship To Patient Self Return Phone Number [phone] (Primary) Chief Complaint Heart palpitations or irregular heartbeat Reason for Call Symptomatic / Request for Health Information Initial Comment Caller states a medication causes her heart to race she is needing to know what she is needing to do. Translation No Nurse Assessment Nurse: Ronelle, RN, Destiny Date/Time (Eastern Time): 10/06/2024 11:13:16 AM Confirm and document reason for call. If symptomatic, describe symptoms. ---caller reports heart palpitations due to clonidine  hcl 0.1mg . Does not take medication frequently, has been taking for at least 2 months. New pt. appt. upcoming Friday. symptoms started this morning. no chest pain/ SOB. Does the patient have any new or worsening symptoms? ---Yes Will a triage be completed? ---Yes Related visit to physician within the last 2 weeks? ---No Does the PT have any chronic conditions? (i.e. diabetes, asthma, this includes High risk factors for pregnancy, etc.) ---Yes List chronic conditions. ---HTN Is this a behavioral health or substance abuse call? ---No Guidelines Guideline Title Affirmed Question Affirmed Notes Nurse Date/Time (Eastern Time) Heart Rate and Heartbeat Questions [1] Heart beating very rapidly (e.g., > 140 / minute) AND [2] NOT present now Loar, RN, Destiny 10/06/2024 11:19:07 AM Guidelines Guideline Title Affirmed Question Affirmed Notes Nurse Date/Time Titus Time) (Exception:  Only during exercise.) Disp. Time Titus Time) Disposition Final User 10/06/2024 11:22:57 AM See HCP (or PCP Triage) Within 4 Hours Yes Loar, RN, Destiny Final Disposition 10/06/2024 11:22:57 AM See HCP (or PCP Triage) Within 4 Hours Yes Loar, RN, Destiny Caller Disagree/Comply Comply Caller Understands Yes PreDisposition Did not know what to do Care Advice Given Per Guideline SEE HCP (OR PCP TRIAGE) WITHIN 4 HOURS: * IF OFFICE WILL BE CLOSED AND NO PCP (PRIMARY CARE PROVIDER) SECOND-LEVEL TRIAGE: You need to be seen within the next 3 or 4 hours. A nearby Urgent Care Center [facility]) is often a good source of care. Another choice is to go to the ED. Go sooner if you become worse. CALL BACK IF: * You become worse CARE ADVICE given per Heart Rate and Heartbeat Questions (Adult) guideline. Comments User: Doyce Ronelle, RN Date/Time Titus Time): 10/06/2024 11:19:29 AM denies current symptoms Referrals GO TO FACILITY UNDECIDED [facility] - UC

## 2024-10-11 ENCOUNTER — Encounter: Payer: Self-pay | Admitting: Internal Medicine

## 2024-10-11 ENCOUNTER — Ambulatory Visit: Admitting: Internal Medicine

## 2024-10-11 VITALS — BP 126/80 | HR 67 | Temp 98.0°F | Ht 62.0 in | Wt 200.2 lb

## 2024-10-11 DIAGNOSIS — F5101 Primary insomnia: Secondary | ICD-10-CM

## 2024-10-11 DIAGNOSIS — F418 Other specified anxiety disorders: Secondary | ICD-10-CM

## 2024-10-11 DIAGNOSIS — F411 Generalized anxiety disorder: Secondary | ICD-10-CM | POA: Diagnosis not present

## 2024-10-11 DIAGNOSIS — E66812 Obesity, class 2: Secondary | ICD-10-CM

## 2024-10-11 DIAGNOSIS — E6609 Other obesity due to excess calories: Secondary | ICD-10-CM

## 2024-10-11 DIAGNOSIS — R7309 Other abnormal glucose: Secondary | ICD-10-CM | POA: Insufficient documentation

## 2024-10-11 DIAGNOSIS — Z23 Encounter for immunization: Secondary | ICD-10-CM | POA: Diagnosis not present

## 2024-10-11 DIAGNOSIS — Z6837 Body mass index (BMI) 37.0-37.9, adult: Secondary | ICD-10-CM

## 2024-10-11 DIAGNOSIS — E782 Mixed hyperlipidemia: Secondary | ICD-10-CM | POA: Diagnosis not present

## 2024-10-11 DIAGNOSIS — K760 Fatty (change of) liver, not elsewhere classified: Secondary | ICD-10-CM

## 2024-10-11 DIAGNOSIS — E039 Hypothyroidism, unspecified: Secondary | ICD-10-CM

## 2024-10-11 DIAGNOSIS — G4733 Obstructive sleep apnea (adult) (pediatric): Secondary | ICD-10-CM

## 2024-10-11 MED ORDER — MAGNESIUM BISGLYCINATE 655 MG PO CAPS: 1.0000 | ORAL_CAPSULE | Freq: Every evening | ORAL | 4 refills | Status: AC | PRN

## 2024-10-11 MED ORDER — WEGOVY 0.25 MG/0.5ML ~~LOC~~ SOAJ: 0.2500 mg | SUBCUTANEOUS | 2 refills | Status: AC

## 2024-10-11 MED ORDER — GABAPENTIN 300 MG PO CAPS
300.0000 mg | ORAL_CAPSULE | Freq: Three times a day (TID) | ORAL | 3 refills | Status: AC
Start: 2024-10-11 — End: ?

## 2024-10-11 MED ORDER — DOXEPIN HCL 10 MG PO CAPS: 10.0000 mg | ORAL_CAPSULE | Freq: Every day | ORAL | 3 refills | Status: AC

## 2024-10-11 MED ORDER — FLUOXETINE HCL 20 MG PO CAPS: 20.0000 mg | ORAL_CAPSULE | Freq: Every day | ORAL | 3 refills | Status: AC

## 2024-10-11 NOTE — Assessment & Plan Note (Addendum)
 Prior Authorization Request I am writing to request prior authorization for daridorexant (Quviviq), lemborexant (Dayvigo), and suvorexant (Belsomra) for my patient, a 78 year old female with chronic lymphocytic leukemia (CLL), hypogammaglobulinemia, thrombocytopenia, class 2 obesity, obstructive sleep apnea, chronic bronchitis, atherosclerosis, porphyria cutanea tarda, acute kidney injury, fatty liver, hypothyroidism, and essential hypertension. She suffers from severe, persistent insomnia characterized by difficulty with both sleep onset and sleep maintenance, which has not responded to non-pharmacologic interventions and is significantly impairing her quality of life.  This patient is at high risk for adverse effects from traditional hypnotics. Benzodiazepines, Z-drugs, first-generation antihistamines, tricyclic antidepressants, and barbiturates are contraindicated in older adults per the American Geriatrics Society Beers Criteria due to increased risk of falls, cognitive impairment, delirium, and physiological dependence. She has a history of angioedema with ACE/ARB, recurrent infections, and polypharmacy, further increasing her vulnerability to medication side effects.[1][2]  Dual orexin receptor antagonists (DORAs)--daridorexant, lemborexant, and suvorexant--are FDA-approved for the treatment of insomnia characterized by difficulties with sleep onset and/or sleep maintenance in adults. These agents have demonstrated efficacy in randomized controlled trials for improving sleep latency, total sleep time, and sleep continuity, with a lower risk of cognitive and psychomotor impairment, falls, and abuse potential compared to benzodiazepine receptor agonists. DORAs do not disrupt sleep architecture and are not associated with withdrawal or rebound insomnia.[3][2][4][5][6]  Given her complex medical history, DORAs are the preferred pharmacologic option for short-term management of her insomnia, as recommended  by the American Geriatrics Society and recent systematic reviews. However, she consumes 2-4 glasses of wine with dinner.[1][2][4][5][6] All DORAs carry warnings regarding additive CNS depressant effects and increased risk of next-day impairment when co-administered with alcohol. The patient has been counseled on the risks of combining these medications with alcohol and will be monitored closely for adverse effects, including falls, cognitive impairment, and complex sleep behaviors.[7][8]  In summary, daridorexant, lemborexant, and suvorexant are medically necessary for this patient due to her severe, refractory insomnia, high risk for adverse effects from other hypnotics, and multiple comorbidities. These agents are supported by FDA approval and current geriatric guidelines as the safest available pharmacologic options for insomnia in older adults. I request coverage for all three agents to allow for individualized titration and optimal management.  Please contact me if additional clinical information is required. References Alternative Treatments to Selected Medications in the 2023 American Geriatrics Society Beers Criteria. Helayne MA. Journal of the [employer]. 2025;73(9):2657-2677. doi:10.1111/jgs.19500. Management of Insomnia. Morin CM, Buysse DJ. The [place] Journal of Medicine. 2024;391(3):247-258. doi:10.1056/NEJMcp2305655. FDA Orange Book. FDA Orange Book. Comparative Efficacy and Safety of Daridorexant, Lemborexant, and Suvorexant for Insomnia: A Systematic Review and Network Meta-Analysis. Wendolyn ONEIDA Leroy ONEIDA, Citrome L, et al. Translational Psychiatry. 2025;15(1):211. doi:10.1038/s41398-025-03439-8. Targeting the Orexin System in the Pharmacological Management of Insomnia and Other Diseases: Suvorexant, Lemborexant, Daridorexant, and Novel Experimental Agents. ?e?abowski K, Petrov W, Wojtysiak K, et al. International Journal of [employer]. 2025;26(17):8700.  doi:10.3390/[id number]. Efficacy and Safety of Insomnia Pharmacotherapies: Convergent Evidence From Bayesian Network Meta-Regression and Research Scientist (medical). Arminda VEAR Cesario CINDERELLA Lucillie CHRISTELLA, et al. Sleep Medicine. 7974;863:893151. doi:10.1016/j.sleep.2025.106848. QUVIVIQ. Food and Drug Administration. Updated date: 2023-09-14. Effect of Alcohol Coadministration on the Pharmacodynamics, Pharmacokinetics, and Safety of Lemborexant: A Randomized, Placebo-Controlled Crossover Study. Vara LILLETTE Shona LOISE Elane JINNY, et al. Journal of Psychopharmacology [place], [place]). 2022;36(6):745-755. doi:10.1177/02698811221080459.

## 2024-10-11 NOTE — Patient Instructions (Addendum)
 It was a pleasure seeing you today! Your health and satisfaction are our top priorities.  Bernardino Cone, MD  VISIT SUMMARY: During today's visit, we discussed your ongoing issues with anxiety, panic attacks, and sleep disturbances. We reviewed your current medications and explored new treatment options to better manage your symptoms. We also addressed your chronic lymphocytic leukemia, fatty liver disease, obesity, low magnesium  levels, and mixed hyperlipidemia. A comprehensive plan was developed to help improve your overall health and well-being.  YOUR PLAN: -GENERALIZED ANXIETY DISORDER WITH PANIC ATTACKS AND PRIMARY INSOMNIA: Generalized anxiety disorder involves excessive worry and anxiety, while primary insomnia is difficulty falling or staying asleep. We will start you on gabapentin  for anxiety, restart Prozac  for long-term management, and prescribe doxepin for sleep. Gradually reduce alcohol consumption and discontinue clonidine . A handout on cognitive behavioral therapy for insomnia is provided, and you are referred to psychiatry and psychology for therapy.  -CHRONIC LYMPHOCYTIC LEUKEMIA (CLL), UNDER ACTIVE MONITORING: CLL is a type of cancer that affects the blood and bone marrow. It is currently being monitored by your oncologist, Dr. Melba, with no treatment required at this time. Continue regular follow-ups with your oncologist and reduce alcohol consumption to improve overall health.  -FATTY LIVER DISEASE: Fatty liver disease occurs when fat builds up in the liver. An ultrasound is scheduled to evaluate your liver and stomach. Reducing alcohol consumption and losing weight are recommended to improve liver health.  -OBESITY, CLASS 2: Class 2 obesity means having a body mass index (BMI) of 35-39.9. We discussed the potential use of Wegovy for weight loss, but you expressed concerns about side effects. Focus on gradual weight loss through diet and exercise.  -HYPOMAGNESEMIA: Hypomagnesemia  is a condition of low magnesium  levels in the blood. This may be due to your use of hydrochlorothiazide . Magnesium  supplementation is recommended, and you are prescribed magnesium  bisglycinate.  -MIXED HYPERLIPIDEMIA: Mixed hyperlipidemia is having high levels of different types of fats in the blood. We discussed the potential use of cholesterol medication, but it is not initiated at this time. Focus on dietary modifications to manage your cholesterol levels.  INSTRUCTIONS: Please follow up with your oncologist, Dr. Melba, for monitoring your CLL. Schedule an ultrasound for your liver and stomach evaluation. Gradually reduce alcohol consumption and discontinue clonidine  as advised. Start taking gabapentin , Prozac , and doxepin as prescribed. Begin magnesium  bisglycinate supplementation. Consider dietary changes to manage your cholesterol levels. You are referred to psychiatry and psychology for therapy, though appointments are not yet scheduled. Your Providers PCP: Cone Bernardino MATSU, MD,  [phone]) Care Team Provider: Onesimo Emaline Brink, MD,  [phone]) Care Team Provider: Burnard Debby LABOR, MD Care Team Provider: Octavia Bruckner, MD,  [phone]) Care Team Provider: Albertus Gordy HERO, MD,  [phone])  NEXT STEPS: [x]  Early Intervention: Schedule sooner appointment, call our on-call services, or go to emergency room if there is any significant Increase in pain or discomfort New or worsening symptoms Sudden or severe changes in your health [x]  Flexible Follow-Up: We recommend a No follow-ups on file. for optimal routine care. This allows for progress monitoring and treatment adjustments. [x]  Preventive Care: Schedule your annual preventive care visit! It's typically covered by insurance and helps identify potential health issues early. [x]  Lab & X-ray Appointments: Incomplete tests scheduled today, or call to schedule. X-rays: [facility] (M-F, 8:30am-noon or  1pm-5pm). [x]  Medical Information Release: Sign a release form at front desk to obtain relevant medical information we don't have.  MAKING THE MOST  OF OUR FOCUSED 20 MINUTE APPOINTMENTS: [x]   Clearly state your top concerns at the beginning of the visit to focus our discussion [x]   If you anticipate you will need more time, please inform the front desk during scheduling - we can book multiple appointments in the same week. [x]   If you have transportation problems- use our convenient video appointments or ask about transportation support. [x]   We can get down to business faster if you use MyChart to update information before the visit and submit non-urgent questions before your visit. Thank you for taking the time to provide details through MyChart.  Let our nurse know and she can import this information into your encounter documents.  Arrival and Wait Times: [x]   Arriving on time ensures that everyone receives prompt attention. [x]   Early morning (8a) and afternoon (1p) appointments tend to have shortest wait times. [x]   Unfortunately, we cannot delay appointments for late arrivals or hold slots during phone calls.  Getting Answers and Following Up [x]   Simple Questions & Concerns: For quick questions or basic follow-up after your visit, reach us  at [phone] or MyChart messaging. [x]   Complex Concerns: If your concern is more complex, scheduling an appointment might be best. Discuss this with the staff to find the most suitable option. [x]   Lab & Imaging Results: We'll contact you directly if results are abnormal or you don't use MyChart. Most normal results will be on MyChart within 2-3 business days, with a review message from Dr. Jesus. Haven't heard back in 2 weeks? Need results sooner? Contact us  at [phone]. [x]   Referrals: Our referral coordinator will manage specialist referrals. The specialist's office should contact you within 2 weeks to schedule an appointment. Call us  if  you haven't heard from them after 2 weeks.  Staying Connected [x]   MyChart: Activate your MyChart for the fastest way to access results and message us . See the last page of this paperwork for instructions on how to activate.  Bring to Your Next Appointment [x]   Medications: Please bring all your medication bottles to your next appointment to ensure we have an accurate record of your prescriptions. [x]   Health Diaries: If you're monitoring any health conditions at home, keeping a diary of your readings can be very helpful for discussions at your next appointment.  Billing [x]   X-ray & Lab Orders: These are billed by separate companies. Contact the invoicing company directly for questions or concerns. [x]   Visit Charges: Discuss any billing inquiries with our administrative services team.  Your Satisfaction Matters [x]   Share Your Experience: We strive for your satisfaction! If you have any complaints, or preferably compliments, please let Dr. Jesus know directly or contact our Practice Administrators, Manuelita Rubin or [employer], by asking at the front desk.   Reviewing Your Records [x]   Review this early draft of your clinical encounter notes below and the final encounter summary tomorrow on MyChart after its been completed.  All orders placed so far are visible here: Primary insomnia Assessment & Plan: Prior Authorization Request I am writing to request prior authorization for daridorexant (Quviviq), lemborexant (Dayvigo), and suvorexant (Belsomra) for my patient, a 78 year old female with chronic lymphocytic leukemia (CLL), hypogammaglobulinemia, thrombocytopenia, class 2 obesity, obstructive sleep apnea, chronic bronchitis, atherosclerosis, porphyria cutanea tarda, acute kidney injury, fatty liver, hypothyroidism, and essential hypertension. She suffers from severe, persistent insomnia characterized by difficulty with both sleep onset and sleep maintenance, which has not responded to  non-pharmacologic interventions and is significantly impairing  her quality of life.  This patient is at high risk for adverse effects from traditional hypnotics. Benzodiazepines, Z-drugs, first-generation antihistamines, tricyclic antidepressants, and barbiturates are contraindicated in older adults per the American Geriatrics Society Beers Criteria due to increased risk of falls, cognitive impairment, delirium, and physiological dependence. She has a history of angioedema with ACE/ARB, recurrent infections, and polypharmacy, further increasing her vulnerability to medication side effects.[1][2]  Dual orexin receptor antagonists (DORAs)--daridorexant, lemborexant, and suvorexant--are FDA-approved for the treatment of insomnia characterized by difficulties with sleep onset and/or sleep maintenance in adults. These agents have demonstrated efficacy in randomized controlled trials for improving sleep latency, total sleep time, and sleep continuity, with a lower risk of cognitive and psychomotor impairment, falls, and abuse potential compared to benzodiazepine receptor agonists. DORAs do not disrupt sleep architecture and are not associated with withdrawal or rebound insomnia.[3][2][4][5][6]  Given her complex medical history, DORAs are the preferred pharmacologic option for short-term management of her insomnia, as recommended by the American Geriatrics Society and recent systematic reviews. However, she consumes 2-4 glasses of wine with dinner.[1][2][4][5][6] All DORAs carry warnings regarding additive CNS depressant effects and increased risk of next-day impairment when co-administered with alcohol. The patient has been counseled on the risks of combining these medications with alcohol and will be monitored closely for adverse effects, including falls, cognitive impairment, and complex sleep behaviors.[7][8]  In summary, daridorexant, lemborexant, and suvorexant are medically necessary for this patient due to  her severe, refractory insomnia, high risk for adverse effects from other hypnotics, and multiple comorbidities. These agents are supported by FDA approval and current geriatric guidelines as the safest available pharmacologic options for insomnia in older adults. I request coverage for all three agents to allow for individualized titration and optimal management.  Please contact me if additional clinical information is required. References Alternative Treatments to Selected Medications in the 2023 American Geriatrics Society Beers Criteria. Helayne MA. Journal of the [employer]. 2025;73(9):2657-2677. doi:10.1111/jgs.19500. Management of Insomnia. Morin CM, Buysse DJ. The [place] Journal of Medicine. 2024;391(3):247-258. doi:10.1056/NEJMcp2305655. FDA Orange Book. FDA Orange Book. Comparative Efficacy and Safety of Daridorexant, Lemborexant, and Suvorexant for Insomnia: A Systematic Review and Network Meta-Analysis. Wendolyn ONEIDA Leroy ONEIDA, Citrome L, et al. Translational Psychiatry. 2025;15(1):211. doi:10.1038/s41398-025-03439-8. Targeting the Orexin System in the Pharmacological Management of Insomnia and Other Diseases: Suvorexant, Lemborexant, Daridorexant, and Novel Experimental Agents. ?e?abowski K, Petrov W, Wojtysiak K, et al. International Journal of [employer]. 2025;26(17):8700. doi:10.3390/ijms26178700. Efficacy and Safety of Insomnia Pharmacotherapies: Convergent Evidence From Bayesian Network Meta-Regression and Research Scientist (medical). Arminda VEAR Cesario CINDERELLA Lucillie CHRISTELLA, et al. Sleep Medicine. 7974;863:893151. doi:10.1016/j.sleep.2025.106848. QUVIVIQ. Food and Drug Administration. Updated date: 2023-09-14. Effect of Alcohol Coadministration on the Pharmacodynamics, Pharmacokinetics, and Safety of Lemborexant: A Randomized, Placebo-Controlled Crossover Study. Vara LILLETTE Shona LOISE Elane JINNY, et al. Journal of Psychopharmacology [place], [place]). 2022;36(6):745-755.  doi:10.1177/[id number].   Orders: -     Ambulatory referral to Psychiatry -     Ambulatory referral to Psychology -     Magnesium  Bisglycinate; Take 1 tablet by mouth at bedtime as needed.  Dispense: 90 capsule; Refill: 4  Generalized anxiety disorder -     Ambulatory referral to Psychiatry -     Ambulatory referral to Psychology  Anxiety with depression -     Doxepin HCl; Take 1 capsule (10 mg total) by mouth at bedtime.  Dispense: 90 capsule; Refill: 3 -     Gabapentin ; Take 1 capsule (300 mg total) by mouth 3 (three) times daily.  Dispense: 90 capsule; Refill: 3 -     FLUoxetine  HCl; Take 1 capsule (20 mg total) by mouth daily.  Dispense: 90 capsule; Refill: 3 -     CBC with Differential/Platelet; Future -     Comprehensive metabolic panel with GFR; Future -     Lipid panel; Future -     Hemoglobin A1c; Future -     Gamma GT; Future  Mixed hyperlipidemia  Acquired hypothyroidism -     TSH + free T4; Future  Hypocalcemia  Hypomagnesemia -     Magnesium  Bisglycinate; Take 1 tablet by mouth at bedtime as needed.  Dispense: 90 capsule; Refill: 4  Class 2 obesity due to excess calories with body mass index (BMI) of 37.0 to 37.9 in adult, unspecified whether serious comorbidity present -     Tzhncb; Inject 0.25 mg into the skin once a week.  Dispense: 2 mL; Refill: 2 -     CBC with Differential/Platelet; Future -     Comprehensive metabolic panel with GFR; Future -     Lipid panel; Future -     Hemoglobin A1c; Future -     Gamma GT; Future  Fatty liver -     Wegovy; Inject 0.25 mg into the skin once a week.  Dispense: 2 mL; Refill: 2 -     CBC with Differential/Platelet; Future -     Comprehensive metabolic panel with GFR; Future -     Lipid panel; Future -     Hemoglobin A1c; Future -     Gamma GT; Future  OSA (obstructive sleep apnea) -     Tzhncb; Inject 0.25 mg into the skin once a week.  Dispense: 2 mL; Refill: 2  Hyperbilirubinemia  Other orders -      Flu vaccine HIGH DOSE PF(Fluzone Trivalent)          This 78 year old woman with chronic lymphocytic leukemia, multimorbidity, and severe insomnia has failed multiple medication trials and is experiencing significant anxiety, racing thoughts, and sleep disruption. The [employer] and American Geriatrics Society recommend cognitive behavioral therapy for insomnia (CBT-I) as first-line management for insomnia in cancer survivors and older adults with chronic disease, given its strong efficacy and safety profile.[1-3]  CBT-I is highly effective and acceptable in chronic disease populations, including cancer, with moderate to large improvements in insomnia severity, sleep efficiency, and sleep onset latency. Meta-analyses confirm its benefit across diverse comorbidities and age groups, and it is recommended over pharmacologic therapy due to lower risk of adverse effects and high patient satisfaction.[2][4-5]  An individualized CBT-I plan for this patient should include:  Stimulus control: Use the bed only for sleep and sex; go to bed only when sleepy; get out of bed if unable to sleep; maintain a consistent wake time; avoid napping.[1][4-5]  Sleep restriction: Limit time in bed to match actual sleep time (no less than 5 hours); gradually adjust sleep window based on sleep efficiency; maintain a regular sleep schedule.[1][4-5]  Cognitive restructuring: Challenge maladaptive beliefs about sleep and insomnia; use Socratic questioning and behavioral experiments to reduce excessive worry and catastrophic thinking about sleep loss.[1][4-5]  Psychoeducation and sleep hygiene: Educate about healthy sleep practices (e.g., limiting caffeine , alcohol, and screen time before bed; optimizing bedroom environment); address disease-specific factors (pain, GERD, OSA, nocturia).[1][3]  Adaptations for comorbidities: For mobility limitations (post-laminectomy, obesity), allow sitting up in  bed or using assistive devices; for fatigue, pace activities and avoid over-restriction; use simplified materials if cognitive impairment is  present.[2]  Non-pharmacologic anxiety management: Incorporate mindfulness, acceptance and commitment therapy, and relaxation techniques (progressive muscle relaxation, deep breathing, guided imagery) to address racing thoughts and fears.[3-4]  Access options: Consider digital CBT-I platforms (e.g., Insomnia Coach, SleepEZ) or telehealth delivery for convenience and accessibility.[2-3]  CBT-I should be delivered by a trained therapist, ideally in-person, but digital and telehealth formats are also effective. Monitor for excessive daytime fatigue, exacerbation of comorbidities, and adjust the plan as needed. If CBT-I alone is unsuccessful, short-term use of dual orexin receptor antagonists (daridorexant, lemborexant, suvorexant) may be considered, but only after shared decision-making and careful risk assessment.[3][6]  The following table summarizes the core psychological and behavioral therapies for insomnia, which should be tailored to this patient's needs:

## 2024-10-11 NOTE — Progress Notes (Signed)
 [facility]  Phone: [phone]  - Medical Office Visit -  Visit Date: 10/11/2024 Patient: Rebecca Bond   DOB: 05/28/1946   78 y.o. Female  MRN: [id number] Patient Care Team: Jesus Bernardino MATSU, MD as PCP - General (Internal Medicine) Burnard Debby LABOR, MD (Inactive) as PCP - Cardiology (Cardiology) Onesimo Emaline Brink, MD as Consulting Physician (Hematology) Octavia Bruckner, MD as Consulting Physician (Ophthalmology) Pyrtle, Gordy HERO, MD as Consulting Physician (Gastroenterology) Today's Health Care Provider: Bernardino MATSU Jesus, MD  ===========================================    Chief Complaint / Reason for Visit: New Pt (Pt is present to est care with pcp mental health issues.)  Background: 78 y.o. 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; Depression, major, in remission; GERD (gastroesophageal reflux disease); Recurrent sinus infections; OSA (obstructive sleep apnea); HTN (hypertension); Vitamin D  deficiency; Hypogammaglobulinemia; Mixed hyperlipidemia; Medication management; Environmental and seasonal allergies; BMI 37.85,   adult; Immunocompromised; Thrombocytopenia; S/P lumbar laminectomy; Chronic bronchitis, unspecified chronic bronchitis type (HCC); Atherosclerosis of aorta (HCC) by CT abd on 04/08/2020; 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HCC); Anxiety with depression; Hyperbilirubinemia; Hypomagnesemia; Fatty liver; Hypothyroidism; Generalized anxiety disorder; and Primary insomnia on their problem list.  Discussed the use of AI scribe software for clinical note transcription with the patient, who gave verbal consent to proceed.  History of Present Illness 78 year old female with anxiety and panic attacks who presents with difficulty managing her symptoms and sleep disturbances.  She has a history of anxiety and panic attacks,  which have been challenging to manage since tapering off long-term Xanax  use last year. She experiences frequent panic attacks, including a recent episode characterized by a rapid heartbeat, prompting a visit to urgent care where an EKG was normal. She is uncertain if her symptoms are related to clonidine , which she has been taking.  She suffers from significant sleep disturbances, unable to sleep more than three to four hours per night, leading to exhaustion. She describes feeling 'like I'm losing my mind' at night and compares her restlessness to the effects of an old diet pill. Attempts to calm herself, such as massaging her legs and applying heat, have been ineffective.  Her current medication regimen includes hydroxyzine , which she was taking four times a day, clonidine  as needed, and she was previously on buprenorphine. She has not found these medications helpful in managing her anxiety. She has also been prescribed buspirone  and Prozac  in the past but is not currently taking them.  She has a history of chronic lymphocytic leukemia (CLL), which she is monitoring with her oncologist, and a fatty liver for which she is scheduled for an ultrasound next week. She is cautious about exposure to illness due to [name], having previously been hospitalized with COVID-19 and pneumonia.  She consumes wine regularly, typically a couple of glasses with dinner. She expresses concern about her ability to care for her husband, Rebecca Bond, who has his own health issues, including eye problems and a history of COVID-19.  She has a history of back surgery and has experienced nerve pain, for which she previously took gabapentin . She is also dealing with a foot injury that has required her to wear a boot since August, limiting her mobility.  She has not shared her mental health struggles with her family, including her husband and daughter, due to concerns about stigma and not wanting to burden them with  her issues.  Problem  overviews updated today: Problem  Primary Insomnia    Medications updated/reviewed: Current Outpatient Medications on File Prior to Visit  Medication Sig   Ascorbic Acid  (VITAMIN C PO) 1500 mg by oral route.   bisoprolol -hydrochlorothiazide  (ZIAC ) 10-6.25 MG tablet Take 1 tablet by mouth every morning.   famotidine  (PEPCID ) 40 MG tablet TAKE 1 TABLET BY MOUTH EVERYDAY AT BEDTIME   levothyroxine  (SYNTHROID ) 50 MCG tablet TAKE 1 TABLET(50 MCG) BY MOUTH DAILY   pantoprazole  (PROTONIX ) 40 MG tablet Take 1 tablet (40 mg total) by mouth daily.   Cholecalciferol  50 MCG (2000 UT) TABS 10,000 units   cloNIDine  (CATAPRES ) 0.1 MG tablet Take 1-2 tables PO; May repeart every 45-60 mins PRN up to a total of 4 doses until symptoms resolve.  Monitor BP (Patient not taking: Reported on 10/11/2024)   hydrOXYzine  (ATARAX ) 10 MG tablet Take 1 tablet 4 x / day for Anxiety (Patient not taking: Reported on 10/11/2024)   promethazine -dextromethorphan (PROMETHAZINE -DM) 6.25-15 MG/5ML syrup Take 1 tsp every 4 hours if needed for cough   RESTASIS  0.05 % ophthalmic emulsion Place 1 drop into both eyes 2 (two) times daily as needed (for seasonal allergies/irritation).    No current facility-administered medications on file prior to visit.   Medications Discontinued During This Encounter  Medication Reason   busPIRone  (BUSPAR ) 10 MG tablet    FLUoxetine  (PROZAC ) 20 MG capsule    Current Meds  Medication Sig   Ascorbic Acid  (VITAMIN C PO) 1500 mg by oral route.   bisoprolol -hydrochlorothiazide  (ZIAC ) 10-6.25 MG tablet Take 1 tablet by mouth every morning.   doxepin (SINEQUAN) 10 MG capsule Take 1 capsule (10 mg total) by mouth at bedtime.   famotidine  (PEPCID ) 40 MG tablet TAKE 1 TABLET BY MOUTH EVERYDAY AT BEDTIME   FLUoxetine  (PROZAC ) 20 MG capsule Take 1 capsule (20 mg total) by mouth daily.   gabapentin  (NEURONTIN ) 300 MG capsule Take 1 capsule (300 mg total) by mouth 3 (three) times daily.   levothyroxine   (SYNTHROID ) 50 MCG tablet TAKE 1 TABLET(50 MCG) BY MOUTH DAILY   Magnesium  Bisglycinate 655 MG CAPS Take 1 tablet by mouth at bedtime as needed.   pantoprazole  (PROTONIX ) 40 MG tablet Take 1 tablet (40 mg total) by mouth daily.   semaglutide-weight management (WEGOVY) 0.25 MG/0.5ML SOAJ SQ injection Inject 0.25 mg into the skin once a week.   Allergies:  Losartan potassium-hctz, Sudafed [pseudoephedrine hcl], Levaquin  [levofloxacin  in d5w], Acyclovir and related, Alendronate, Biaxin [clarithromycin], Cantaloupe extract allergy skin test, Celexa [citalopram], Ciprofloxacin , Cyclobenzaprine, Fosamax [alendronate sodium], Gabapentin , Iohexol , Losartan, Meloxicam, Norvasc [amlodipine], Other, Pseudoephedrine, and Zoloft [sertraline hcl] Past Medical History:  has a past medical history of Abnormal glucose, AKI (acute kidney injury) (04/08/2020), Anemia, Anxiety, Arthritis, CLL (chronic lymphocytic leukemia) (HCC) (dx'd ~ 2015), Depression, GERD (gastroesophageal reflux disease), Headache, Hepatitis A (08/02/2012), History of kidney stones, Hypertension, Hypocalcemia (04/08/2020), Norovirus (04/10/2020), OSA (obstructive sleep apnea), Osteoarthritis, Pneumonia, Pneumonia due to COVID-19 virus (09/08/2021), Prediabetes, Recurrent sinus infections (08/02/2012), and Tubular adenoma of colon. Past Surgical History:   has a past surgical history that includes Cholecystectomy (1985); Tonsillectomy and adenoidectomy (1950's); Breast surgery; Lymph node biopsy; Temporomandibular joint arthroplasty (1980's); Functional endoscopic sinus surgery (1990's); Abdominal hysterectomy (1980's); laparoscopic appendectomy (N/A, 06/04/2014); immunoglobulin treatment (2017); Appendectomy; Shoulder arthroscopy (Right); Cataract extraction, bilateral; and Lumbar laminectomy/decompression microdiscectomy (Left, 11/01/2017). Social History:   reports that she quit smoking about 55 years ago. Her smoking use included cigarettes. She started  smoking about 20  years ago. She has a 3 pack-year smoking history. She has never used smokeless tobacco. She reports current alcohol use of about 7.0 standard drinks of alcohol per week. She reports that she does not use drugs. Family History:  family history includes Arthritis in her brother; Heart attack in her brother and maternal grandmother; Hypertension in her maternal grandmother and mother; Parkinson's disease in her mother. Depression Screen and Health Maintenance:    10/11/2024    1:25 PM 07/06/2023   11:21 PM 11/16/2022    4:42 PM 08/21/2022   10:39 PM  PHQ 2/9 Scores  PHQ - 2 Score 2 0 6 0  PHQ- 9 Score 10  16       Data saved with a previous flowsheet row definition   Health Maintenance  Topic Date Due   Zoster Vaccines- Shingrix (1 of 2) Never done   Mammogram  02/20/2023   Medicare Annual Wellness (AWV)  11/17/2023   COVID-19 Vaccine (4 - 2025-26 season) 10/27/2024 (Originally 08/05/2024)   Colonoscopy  08/19/2025   DTaP/Tdap/Td (3 - Tdap) 08/22/2028   Pneumococcal Vaccine: 50+ Years  Completed   Influenza Vaccine  Completed   DEXA SCAN  Completed   Hepatitis C Screening  Completed   Meningococcal B Vaccine  Aged Out   Immunization History  Administered Date(s) Administered   Fluad Quad(high Dose 65+) 10/16/2020, 09/28/2022   INFLUENZA, HIGH DOSE SEASONAL PF 08/29/2017, 08/22/2018, 09/11/2019, 10/07/2021, 09/29/2022, 08/31/2023, 10/11/2024   Influenza,inj,Quad PF,6+ Mos 09/15/2014, 08/13/2015   Influenza-Unspecified 08/31/2013, 09/04/2014   PFIZER(Purple Top)SARS-COV-2 Vaccination 01/16/2020, 02/10/2020, 10/16/2020   Pneumococcal Conjugate-13 09/23/2015   Pneumococcal Polysaccharide-23 08/29/2017   Pneumococcal-Unspecified 12/28/2010   Td 07/05/2006, 08/22/2018    Objective   Physical ExamBP 126/80   Pulse 67   Temp 98 F (36.7 C) (Temporal)   Ht 5' 2 (1.575 m)   Wt 200 lb 3.2 oz (90.8 kg)   SpO2 98%   BMI 36.62 kg/m  Wt Readings from Last 10 Encounters:   10/11/24 200 lb 3.2 oz (90.8 kg)  06/13/24 202 lb 8 oz (91.9 kg)  06/03/24 202 lb 9.6 oz (91.9 kg)  08/31/23 199 lb (90.3 kg)  08/10/23 202 lb (91.6 kg)  08/10/23 202 lb (91.6 kg)  08/01/23 206 lb 9.6 oz (93.7 kg)  07/20/23 209 lb 9.6 oz (95.1 kg)  07/06/23 207 lb 9.6 oz (94.2 kg)  01/25/23 195 lb (88.5 kg)  Vital signs reviewed.  Nursing notes reviewed. Weight trend reviewed. General Appearance:  Well developed, well nourished, well-groomed, healthy-appearing female with Body mass index is 36.62 kg/m. No acute distress appreciable.   Skin: Clear and well-hydrated. Pulmonary:  Normal work of breathing at rest, no respiratory distress apparent. SpO2: 98 %  Musculoskeletal: She demonstrates smooth and coordinated movements throughout all major joints.All extremities are intact.  Neurological:  Awake, alert, oriented, and engaged.  No obvious focal neurological deficits or cognitive impairments.  Sensorium seems unclouded.  Psychiatric:  Appropriate mood, pleasant and cooperative demeanor, cheerful and engaged during the exam  Reviewed Results & Data Results DIAGNOSTIC EKG: Normal (10/06/2024)   Appointment on 06/03/2024  Component Date Value   LDH 06/03/2024 170    Uric Acid, Serum 06/03/2024 8.7 (H)    Sodium 06/03/2024 135    Potassium 06/03/2024 4.0    Chloride 06/03/2024 99    CO2 06/03/2024 27    Glucose, Bld 06/03/2024 117 (H)    BUN 06/03/2024 15    Creatinine 06/03/2024 0.83    Calcium   06/03/2024 10.0    Total Protein 06/03/2024 6.3 (L)    Albumin 06/03/2024 4.5    AST 06/03/2024 21    ALT 06/03/2024 13    Alkaline Phosphatase 06/03/2024 59    Total Bilirubin 06/03/2024 0.9    GFR, Estimated 06/03/2024 >60    Anion gap 06/03/2024 9    WBC Count 06/03/2024 34.9 (H)    RBC 06/03/2024 3.88    Hemoglobin 06/03/2024 12.3    HCT 06/03/2024 35.7 (L)    MCV 06/03/2024 92.0    MCH 06/03/2024 31.7    MCHC 06/03/2024 34.5    RDW 06/03/2024 12.9    Platelet Count  06/03/2024 89 (L)    nRBC 06/03/2024 0.0    Neutrophils Relative % 06/03/2024 13    Neutro Abs 06/03/2024 4.4    Lymphocytes Relative 06/03/2024 83    Lymphs Abs 06/03/2024 28.9 (H)    Monocytes Relative 06/03/2024 3    Monocytes Absolute 06/03/2024 1.2 (H)    Eosinophils Relative 06/03/2024 1    Eosinophils Absolute 06/03/2024 0.2    Basophils Relative 06/03/2024 0    Basophils Absolute 06/03/2024 0.1    WBC Morphology 06/03/2024 Lymphocytosis with morphology consistent with known diagnosis of CLL.    RBC Morphology 06/03/2024 MORPHOLOGY UNREMARKABLE    Smear Review 06/03/2024 Normal platelet morphology    Immature Granulocytes 06/03/2024 0    Abs Immature Granulocytes 06/03/2024 0.11 (H)     ASSESSMENT & PLAN   Assessment & Plan Primary insomnia   Prior Authorization Request I am writing to request prior authorization for daridorexant (Quviviq), lemborexant (Dayvigo), and suvorexant (Belsomra) for my patient, a 78 year old female with chronic lymphocytic leukemia (CLL), hypogammaglobulinemia, thrombocytopenia, class 2 obesity, obstructive sleep apnea, chronic bronchitis, atherosclerosis, porphyria cutanea tarda, acute kidney injury, fatty liver, hypothyroidism, and essential hypertension. She suffers from severe, persistent insomnia characterized by difficulty with both sleep onset and sleep maintenance, which has not responded to non-pharmacologic interventions and is significantly impairing her quality of life.  This patient is at high risk for adverse effects from traditional hypnotics. Benzodiazepines, Z-drugs, first-generation antihistamines, tricyclic antidepressants, and barbiturates are contraindicated in older adults per the American Geriatrics Society Beers Criteria due to increased risk of falls, cognitive impairment, delirium, and physiological dependence. She has a history of angioedema with ACE/ARB, recurrent infections, and polypharmacy, further increasing her  vulnerability to medication side effects.[1][2]  Dual orexin receptor antagonists (DORAs)--daridorexant, lemborexant, and suvorexant--are FDA-approved for the treatment of insomnia characterized by difficulties with sleep onset and/or sleep maintenance in adults. These agents have demonstrated efficacy in randomized controlled trials for improving sleep latency, total sleep time, and sleep continuity, with a lower risk of cognitive and psychomotor impairment, falls, and abuse potential compared to benzodiazepine receptor agonists. DORAs do not disrupt sleep architecture and are not associated with withdrawal or rebound insomnia.[3][2][4][5][6]  Given her complex medical history, DORAs are the preferred pharmacologic option for short-term management of her insomnia, as recommended by the American Geriatrics Society and recent systematic reviews. However, she consumes 2-4 glasses of wine with dinner.[1][2][4][5][6] All DORAs carry warnings regarding additive CNS depressant effects and increased risk of next-day impairment when co-administered with alcohol. The patient has been counseled on the risks of combining these medications with alcohol and will be monitored closely for adverse effects, including falls, cognitive impairment, and complex sleep behaviors.[7][8]  In summary, daridorexant, lemborexant, and suvorexant are medically necessary for this patient due to her severe, refractory insomnia, high risk for adverse effects from other hypnotics,  and multiple comorbidities. These agents are supported by FDA approval and current geriatric guidelines as the safest available pharmacologic options for insomnia in older adults. I request coverage for all three agents to allow for individualized titration and optimal management.  Please contact me if additional clinical information is required. References Alternative Treatments to Selected Medications in the 2023 American Geriatrics Society Beers Criteria.  Helayne MA. Journal of the [employer]. 2025;73(9):2657-2677. doi:10.1111/jgs.19500. Management of Insomnia. Morin CM, Buysse DJ. The [place] Journal of Medicine. 2024;391(3):247-258. doi:10.1056/NEJMcp2305655. FDA Orange Book. FDA Orange Book. Comparative Efficacy and Safety of Daridorexant, Lemborexant, and Suvorexant for Insomnia: A Systematic Review and Network Meta-Analysis. Wendolyn ONEIDA Leroy ONEIDA, Citrome L, et al. Translational Psychiatry. 2025;15(1):211. doi:10.1038/s41398-025-03439-8. Targeting the Orexin System in the Pharmacological Management of Insomnia and Other Diseases: Suvorexant, Lemborexant, Daridorexant, and Novel Experimental Agents. ?e?abowski K, Petrov W, Wojtysiak K, et al. International Journal of [employer]. 2025;26(17):8700. doi:10.3390/ijms26178700. Efficacy and Safety of Insomnia Pharmacotherapies: Convergent Evidence From Bayesian Network Meta-Regression and Research Scientist (medical). Arminda VEAR Cesario CINDERELLA Lucillie CHRISTELLA, et al. Sleep Medicine. 7974;863:893151. doi:10.1016/j.sleep.2025.106848. QUVIVIQ. Food and Drug Administration. Updated date: 2023-09-14. Effect of Alcohol Coadministration on the Pharmacodynamics, Pharmacokinetics, and Safety of Lemborexant: A Randomized, Placebo-Controlled Crossover Study. Vara LILLETTE Shona LOISE Elane JINNY, et al. Journal of Psychopharmacology [place], [place]). 2022;36(6):745-755. doi:10.1177/[id number].  Generalized anxiety disorder Anxiety with depression Generalized anxiety disorder with panic attacks and primary insomnia   She experiences severe anxiety with panic attacks and primary insomnia. Clonidine  may worsen anxiety and heart rate issues, particularly during withdrawal. Alcohol may exacerbate anxiety. Previous treatments with Xanax  and hydroxyzine  were ineffective. Gabapentin  and Prozac  are considered for anxiety management, with doxepin prescribed for sleep. Therapy is recommended but not pursued due to  personal and familial stigma. Gabapentin  is preferred over alcohol and Xanax  for its safety and lack of long-term cognitive effects. Gabapentin  is prescribed for anxiety, Prozac  is restarted for long-term management, and doxepin is prescribed for sleep. A handout on cognitive behavioral therapy for insomnia is provided. She is referred to psychiatry and psychology for therapy, though not scheduled. Gradual reduction of alcohol and discontinuation of clonidine  are advised. Mixed hyperlipidemia Not currently on cholesterol medication. Dietary modifications are not prioritized at this time. The potential use of cholesterol medication is discussed but not initiated. Acquired hypothyroidism Shared decision-making done; patient understood rationale and agreed to labwork  Hypomagnesemia Chronic low magnesium  levels, possibly due to hydrochlorothiazide  use. Magnesium  supplementation is recommended, and magnesium  bisglycinate is prescribed. Class 2 obesity due to excess calories with body mass index (BMI) of 37.0 to 37.9 in adult, unspecified whether serious comorbidity present OSA (obstructive sleep apnea) Class 2 obesity with interest in weight loss. The potential use of Wegovy for weight loss and fatty liver improvement is discussed, but she is concerned about side effects such as nausea, vomiting, diarrhea, and constipation. Fatty liver Likely exacerbated by alcohol consumption. An ultrasound is scheduled for liver and stomach evaluation. Weight loss and alcohol reduction are recommended to improve liver health. She is advised to reduce alcohol consumption and an ultrasound is scheduled for further evaluation. Hyperbilirubinemia Lab Results  Component Value Date/Time   ALT 13 06/03/2024 01:30 PM   ALT 14 08/31/2023 03:11 PM   ALT 18 08/10/2023 02:01 PM   ALT 28 08/01/2023 04:04 PM   ALT 18 12/27/2022 01:41 PM   ALT 25 12/12/2022 04:17 PM   ALT 98 (H) 11/16/2022 05:04 PM   ALT 44 07/27/2022 02:14 PM  ALT 12 02/08/2022 01:59 PM   ALT 18 07/26/2021 02:26 PM   ALT 23 11/08/2017 02:18 PM   ALT 15 07/24/2017 02:17 PM   ALT 21 05/18/2017 02:07 PM   ALT 199 (H) 03/27/2017 02:44 PM   ALT 18 02/20/2017 03:58 PM   Lab Results  Component Value Date/Time   AST 21 06/03/2024 01:30 PM   AST 19 08/31/2023 03:11 PM   AST 20 08/10/2023 02:01 PM   AST 30 08/01/2023 04:04 PM   AST 21 12/27/2022 01:41 PM   AST 19 12/12/2022 04:17 PM   AST 45 (H) 11/16/2022 05:04 PM   AST 40 07/27/2022 02:14 PM   AST 20 02/08/2022 01:59 PM   AST 23 07/26/2021 02:26 PM   AST 24 11/08/2017 02:18 PM   AST 26 07/24/2017 02:17 PM   AST 26 05/18/2017 02:07 PM   AST 246 (HH) 03/27/2017 02:44 PM   AST 23 02/20/2017 03:58 PM   Lab Results  Component Value Date/Time   ALKPHOS 59 06/03/2024 01:30 PM   ALKPHOS 104 12/27/2022 01:41 PM   ALKPHOS 62 07/27/2022 02:14 PM   ALKPHOS 70 02/08/2022 01:59 PM   ALKPHOS 38 10/04/2021 03:30 PM   ALKPHOS 62 11/08/2017 02:18 PM   ALKPHOS 56 07/24/2017 02:17 PM   ALKPHOS 58 05/18/2017 02:07 PM   ALKPHOS 62 03/27/2017 02:44 PM   ALKPHOS 66 02/20/2017 03:58 PM   No components found for: BILIT Lab Results  Component Value Date/Time   LABGGT 81 (H) 12/12/2022 04:17 PM   LABGGT 75 (H) 04/06/2017 04:50 PM   LABGGT 109 (H) 03/30/2017 05:54 PM   Lab Results  Component Value Date   BILITOT 0.9 06/03/2024   BILITOT 0.7 08/31/2023   BILITOT 0.9 08/10/2023   BILITOT 1.1 08/01/2023   BILITOT 0.7 12/27/2022   BILITOT 0.8 12/12/2022   BILITOT 1.0 11/16/2022   BILITOT 0.6 08/22/2022   BILITOT 0.5 07/27/2022   BILITOT 0.9 02/24/2022   BILITOT 0.6 02/08/2022   BILITOT 0.7 01/18/2022   BILITOT 0.8 10/04/2021   BILITOT 1.2 09/29/2021   BILITOT 0.7 09/11/2021   BILITOT 0.7 09/10/2021   BILITOT 1.6 (H) 09/09/2021   BILITOT 1.0 07/26/2021   BILITOT 1.2 05/13/2021   BILITOT 0.9 02/10/2021   BILITOT 0.7 01/27/2021   BILITOT 0.9 08/13/2020   BILITOT 0.9 07/06/2020   BILITOT 1.3  (H) 05/13/2020   BILITOT 0.7 04/27/2020   BILITOT 0.9 04/09/2020   BILITOT 1.3 (H) 04/08/2020   BILITOT 0.9 03/11/2020   BILITOT 0.8 02/19/2020   BILITOT 0.9 10/15/2019   BILITOT 0.7 09/11/2019   BILITOT 0.7 06/12/2019   BILITOT 0.6 05/29/2019   BILITOT 0.6 03/13/2019   BILITOT 0.6 12/24/2018   BILITOT 0.9 12/13/2018   BILITOT 0.7 11/14/2018   BILITOT 0.5 09/19/2018   BILITOT 0.9 08/22/2018   BILITOT 0.5 07/18/2018   BILITOT 0.4 04/06/2018   BILITOT 0.6 02/14/2018   BILITOT 0.7 02/07/2018   BILITOT 0.5 01/18/2018   BILITOT 1.05 11/08/2017   BILITOT 0.7 08/29/2017   BILITOT 0.77 07/24/2017   BILITOT 0.6 05/31/2017   BILITOT 0.78 05/18/2017   BILITOT 0.8 04/24/2017    Lab Results  Component Value Date   HGBA1C 5.3 08/31/2023   HGBA1C 5.1 08/22/2022   HGBA1C 4.9 05/13/2021    ORDER ASSOCIATIONS  #   DIAGNOSIS / CONDITION ICD-10 ENCOUNTER ORDER     ICD-10-CM   1. Primary insomnia  F51.01 Ambulatory referral to Psychiatry    Ambulatory referral  to Psychology    Magnesium  Bisglycinate 655 MG CAPS    2. Generalized anxiety disorder  F41.1 Ambulatory referral to Psychiatry    Ambulatory referral to Psychology    3. Anxiety with depression  F41.8 doxepin (SINEQUAN) 10 MG capsule    gabapentin  (NEURONTIN ) 300 MG capsule    FLUoxetine  (PROZAC ) 20 MG capsule    CBC with Differential/Platelet    Comprehensive metabolic panel with GFR    Lipid panel    HgB A1c    Gamma GT    4. Mixed hyperlipidemia  E78.2     5. Acquired hypothyroidism  E03.9 TSH + free T4    6. Hypomagnesemia  E83.42 Magnesium  Bisglycinate 655 MG CAPS    7. Class 2 obesity due to excess calories with body mass index (BMI) of 37.0 to 37.9 in adult, unspecified whether serious comorbidity present  E66.812 semaglutide-weight management (WEGOVY) 0.25 MG/0.5ML SOAJ SQ injection   E66.09 CBC with Differential/Platelet   Z68.37 Comprehensive metabolic panel with GFR    Lipid panel    HgB A1c    Gamma GT     8. Fatty liver  K76.0 semaglutide-weight management (WEGOVY) 0.25 MG/0.5ML SOAJ SQ injection    CBC with Differential/Platelet    Comprehensive metabolic panel with GFR    Lipid panel    HgB A1c    Gamma GT    9. OSA (obstructive sleep apnea)  G47.33 semaglutide-weight management (WEGOVY) 0.25 MG/0.5ML SOAJ SQ injection    10. Hyperbilirubinemia  E80.6       Future Appointments  Date Time Provider Department Center  10/16/2024  2:00 PM [facility]  2 [facility]  [place]  10/25/2024  3:00 PM Craig Alan SAUNDERS, PA-C [facility]  11/18/2024  1:40 PM Jesus Bernardino MATSU, MD [facility]  11/21/2024  2:20 PM Elnor Lauraine BRAVO, NP [facility]  12/11/2024 11:30 AM [facility] None  12/11/2024 12:00 PM Onesimo Emaline Brink, MD [facility] [facility] None         Additional notes: This document was synthesized by artificial intelligence (Abridge) using HIPAA-compliant recording of the clinical interaction;   We discussed the use of AI scribe software for clinical note transcription with the patient, who gave verbal consent to proceed.    Additional Info: This encounter employed state-of-the-art, real-time, collaborative documentation. The patient actively reviewed and assisted in updating their electronic medical record on a shared screen, ensuring transparency and facilitating joint problem-solving for the problem list, overview, and plan. This approach promotes accurate, informed care. The treatment plan was discussed and reviewed in detail, including medication safety, potential side effects, and all patient questions. We confirmed understanding and comfort with the plan. Follow-up instructions were established, including contacting the office for any concerns, returning if symptoms worsen, persist, or new symptoms develop, and precautions for potential emergency department visits.  Initial Appointment Goals:  This initial visit focused on establishing a foundation for the  patient's care. We collaboratively reviewed her medical history and medications in detail, updating the chart as shown in the encounter. Given the extensive information, we prioritized addressing her most pressing concerns, which she reported were: New Pt (Pt is present to est care with pcp mental health issues.)  While the complexity of the patient's medical picture may necessitate further evaluation in subsequent visits, we were able to develop a preliminary care plan together. To expedite a comprehensive plan at the next visit, we encouraged the patient to gather relevant medical records from previous providers. This collaborative approach will  ensure a more complete understanding of the patient's health and inform the development of a personalized care plan. We look forward to continuing the conversation and working together with the patient on achieving her health goals.   Collaborative Documentation:  Today's encounter utilized real-time, dynamic patient engagement.  Patients actively participate by directly reviewing and assisting in updating their medical records through a shared screen. This transparency empowers patients to visually confirm chart updates made by the healthcare provider.  This collaborative approach facilitates problem management as we jointly update the problem list, problem overview, and assessment/plan. Ultimately, this process enhances chart accuracy and completeness, fostering shared decision-making, patient education, and informed consent for tests and treatments.  Collaborative Treatment Planning:  Treatment plans were discussed and reviewed in detail.  Explained medication safety and potential side effects.  Encouraged participation and answered all patient questions, confirming understanding and comfort with the plan. Encouraged patient to contact our office if they have any questions or concerns. Agreed on patient returning to office if symptoms worsen, persist, or new  symptoms develop.  ----------------------------------------------------- Bernardino KANDICE Cone, MD  10/12/2024 8:38 PM  [facility] at [facility]:  [phone]

## 2024-10-12 ENCOUNTER — Encounter: Payer: Self-pay | Admitting: Internal Medicine

## 2024-10-12 DIAGNOSIS — Z780 Asymptomatic menopausal state: Secondary | ICD-10-CM | POA: Insufficient documentation

## 2024-10-12 NOTE — Assessment & Plan Note (Signed)
 Generalized anxiety disorder with panic attacks and primary insomnia   She experiences severe anxiety with panic attacks and primary insomnia. Clonidine  may worsen anxiety and heart rate issues, particularly during withdrawal. Alcohol may exacerbate anxiety. Previous treatments with Xanax  and hydroxyzine  were ineffective. Gabapentin  and Prozac  are considered for anxiety management, with doxepin prescribed for sleep. Therapy is recommended but not pursued due to personal and familial stigma. Gabapentin  is preferred over alcohol and Xanax  for its safety and lack of long-term cognitive effects. Gabapentin  is prescribed for anxiety, Prozac  is restarted for long-term management, and doxepin is prescribed for sleep. A handout on cognitive behavioral therapy for insomnia is provided. She is referred to psychiatry and psychology for therapy, though not scheduled. Gradual reduction of alcohol and discontinuation of clonidine  are advised.

## 2024-10-12 NOTE — Assessment & Plan Note (Signed)
 Shared decision-making done; patient understood rationale and agreed to [facility]

## 2024-10-12 NOTE — Assessment & Plan Note (Signed)
 Lab Results  Component Value Date/Time   ALT 13 06/03/2024 01:30 PM   ALT 14 08/31/2023 03:11 PM   ALT 18 08/10/2023 02:01 PM   ALT 28 08/01/2023 04:04 PM   ALT 18 12/27/2022 01:41 PM   ALT 25 12/12/2022 04:17 PM   ALT 98 (H) 11/16/2022 05:04 PM   ALT 44 07/27/2022 02:14 PM   ALT 12 02/08/2022 01:59 PM   ALT 18 07/26/2021 02:26 PM   ALT 23 11/08/2017 02:18 PM   ALT 15 07/24/2017 02:17 PM   ALT 21 05/18/2017 02:07 PM   ALT 199 (H) 03/27/2017 02:44 PM   ALT 18 02/20/2017 03:58 PM   Lab Results  Component Value Date/Time   AST 21 06/03/2024 01:30 PM   AST 19 08/31/2023 03:11 PM   AST 20 08/10/2023 02:01 PM   AST 30 08/01/2023 04:04 PM   AST 21 12/27/2022 01:41 PM   AST 19 12/12/2022 04:17 PM   AST 45 (H) 11/16/2022 05:04 PM   AST 40 07/27/2022 02:14 PM   AST 20 02/08/2022 01:59 PM   AST 23 07/26/2021 02:26 PM   AST 24 11/08/2017 02:18 PM   AST 26 07/24/2017 02:17 PM   AST 26 05/18/2017 02:07 PM   AST 246 (HH) 03/27/2017 02:44 PM   AST 23 02/20/2017 03:58 PM   Lab Results  Component Value Date/Time   ALKPHOS 59 06/03/2024 01:30 PM   ALKPHOS 104 12/27/2022 01:41 PM   ALKPHOS 62 07/27/2022 02:14 PM   ALKPHOS 70 02/08/2022 01:59 PM   ALKPHOS 38 10/04/2021 03:30 PM   ALKPHOS 62 11/08/2017 02:18 PM   ALKPHOS 56 07/24/2017 02:17 PM   ALKPHOS 58 05/18/2017 02:07 PM   ALKPHOS 62 03/27/2017 02:44 PM   ALKPHOS 66 02/20/2017 03:58 PM   No components found for: BILIT Lab Results  Component Value Date/Time   LABGGT 81 (H) 12/12/2022 04:17 PM   LABGGT 75 (H) 04/06/2017 04:50 PM   LABGGT 109 (H) 03/30/2017 05:54 PM   Lab Results  Component Value Date   BILITOT 0.9 06/03/2024   BILITOT 0.7 08/31/2023   BILITOT 0.9 08/10/2023   BILITOT 1.1 08/01/2023   BILITOT 0.7 12/27/2022   BILITOT 0.8 12/12/2022   BILITOT 1.0 11/16/2022   BILITOT 0.6 08/22/2022   BILITOT 0.5 07/27/2022   BILITOT 0.9 02/24/2022   BILITOT 0.6 02/08/2022   BILITOT 0.7 01/18/2022   BILITOT 0.8  10/04/2021   BILITOT 1.2 09/29/2021   BILITOT 0.7 09/11/2021   BILITOT 0.7 09/10/2021   BILITOT 1.6 (H) 09/09/2021   BILITOT 1.0 07/26/2021   BILITOT 1.2 05/13/2021   BILITOT 0.9 02/10/2021   BILITOT 0.7 01/27/2021   BILITOT 0.9 08/13/2020   BILITOT 0.9 07/06/2020   BILITOT 1.3 (H) 05/13/2020   BILITOT 0.7 04/27/2020   BILITOT 0.9 04/09/2020   BILITOT 1.3 (H) 04/08/2020   BILITOT 0.9 03/11/2020   BILITOT 0.8 02/19/2020   BILITOT 0.9 10/15/2019   BILITOT 0.7 09/11/2019   BILITOT 0.7 06/12/2019   BILITOT 0.6 05/29/2019   BILITOT 0.6 03/13/2019   BILITOT 0.6 12/24/2018   BILITOT 0.9 12/13/2018   BILITOT 0.7 11/14/2018   BILITOT 0.5 09/19/2018   BILITOT 0.9 08/22/2018   BILITOT 0.5 07/18/2018   BILITOT 0.4 04/06/2018   BILITOT 0.6 02/14/2018   BILITOT 0.7 02/07/2018   BILITOT 0.5 01/18/2018   BILITOT 1.05 11/08/2017   BILITOT 0.7 08/29/2017   BILITOT 0.77 07/24/2017   BILITOT 0.6 05/31/2017   BILITOT 0.78 05/18/2017  BILITOT 0.8 04/24/2017

## 2024-10-12 NOTE — Assessment & Plan Note (Signed)
 Class 2 obesity with interest in weight loss. The potential use of Wegovy for weight loss and fatty liver improvement is discussed, but she is concerned about side effects such as nausea, vomiting, diarrhea, and constipation.

## 2024-10-12 NOTE — Assessment & Plan Note (Signed)
 Chronic low magnesium  levels, possibly due to hydrochlorothiazide  use. Magnesium  supplementation is recommended, and magnesium  bisglycinate is prescribed.

## 2024-10-12 NOTE — Assessment & Plan Note (Signed)
 Likely exacerbated by alcohol consumption. An ultrasound is scheduled for liver and stomach evaluation. Weight loss and alcohol reduction are recommended to improve liver health. She is advised to reduce alcohol consumption and an ultrasound is scheduled for further evaluation.

## 2024-10-12 NOTE — Assessment & Plan Note (Signed)
 Not currently on cholesterol medication. Dietary modifications are not prioritized at this time. The potential use of cholesterol medication is discussed but not initiated.

## 2024-10-14 ENCOUNTER — Telehealth: Payer: Self-pay

## 2024-10-14 ENCOUNTER — Other Ambulatory Visit (HOSPITAL_COMMUNITY): Payer: Self-pay

## 2024-10-14 DIAGNOSIS — S92344D Nondisplaced fracture of fourth metatarsal bone, right foot, subsequent encounter for fracture with routine healing: Secondary | ICD-10-CM | POA: Diagnosis not present

## 2024-10-14 NOTE — Telephone Encounter (Signed)
 Pharmacy Patient Advocate Encounter  Received notification from [facility] that Prior Authorization for  [facility] 0.25MG /0.5ML auto-injectors  has been DENIED.  Full denial letter will be uploaded to the media tab. See denial reason below.   PA #/Case ID/Reference #: [id number]

## 2024-10-14 NOTE — Telephone Encounter (Signed)
 Pharmacy Patient Advocate Encounter   Received notification from [employer]base that prior authorization for [facility] 0.25MG /0.5ML auto-injectors is required/requested.   Insurance verification completed.   The patient is insured through [facility].   Per test claim: PA required; PA submitted to above mentioned insurance via Latent Key/confirmation #/EOC [id number] Status is pending

## 2024-10-14 NOTE — Telephone Encounter (Unsigned)
 ?? EPIC RTF BORDER MASTERY Exploring the full spectrum of border styles, thicknesses, and combinations      ?? SHOWCASE 1: BORDER THICKNESS MASTERY   EXPLORING BORDER WIDTHS: 1px to 10px  THIN BORDERS (1-3px) - Subtle Separation 1px solid  Most subtle  Best for: Dense data tables, subtle dividers  2px solid  Standard weight  Best for: General purpose tables, default choice  3px solid  Slightly bold  Best for: Section boundaries, emphasis  4px solid - MEDIUM WEIGHT Creates strong visual separation without overwhelming. Perfect for highlighting important sections or creating clear boundaries between major content blocks. Best for: Important alerts, primary section containers, key clinical findings   5px solid - BOLD WEIGHT Commanding presence. Draws immediate attention. Use sparingly for maximum impact. Best for: Critical alerts, urgent action items, top-level section containers   6px solid - HEAVY WEIGHT (RED for urgency) ? CRITICAL ALERT FORMAT This thickness screams importance. Reserve for truly critical information that requires immediate attention. The thickness alone conveys urgency.   8px solid - EXTRA HEAVY (Success/Achievement)  Major accomplishment container. Creates a frame effect that elevates the importance of content. Perfect for treatment success stories, major milestones, outstanding achievements   10px solid - MAXIMUM WEIGHT  This is as bold as borders get. Creates a picture frame effect that makes content feel like a formal document or certificate.  Reserve for: Formal summaries, discharge instructions, critical patient education, legal disclosures        ?? SHOWCASE 2: BORDER STYLE VARIATIONS   EXPLORING BORDER STYLES: Beyond Simple Solid Lines   SOLID BORDERS  The classic. Clean, professional, universally compatible. Your default choice for 90% of use cases. Thin (1-2px): Data tables, subtle divisions Medium (3-5px): Section containers,  important content Thick (6-10px): Critical alerts, document frames  DASHED BORDERS  Suggests informality, work-in-progress, or supplementary information. Creates visual interest while maintaining professional appearance. Best for: Optional information sections Preliminary findings or pending results Informal notes or side comments Separating supplementary from primary content  DOTTED BORDERS  Playful, less formal than dashed. Suggests tear here or optional content. Good for creating visual texture without heaviness. Creative uses: Patient education sections (friendlier appearance) Optional medication alternatives Lifestyle modification suggestions Supplement to main clinical content  DOUBLE BORDERS  Formal, important, classic. Conveys gravitas and permanence. The double line creates an elegant, sophisticated appearance. Perfect for: Legal documents: Consent forms, disclaimers, formal agreements Final summaries: Discharge summaries, procedure reports Key findings: Definitive diagnoses, major conclusions Document headers/footers: Official documentation elements ?? Pro tip: Double borders at 4-6px thickness create a distinguished, formal appearance that works beautifully for important clinical summaries.   RIDGE BORDERS (3D Effect)  Creates a raised, embossed appearance. The border appears to pop out from the page. Adds dimensionality and visual interest. Ideal for: Making content appear elevated or important Creating visual hierarchy with 3D depth Drawing attention without using bright colors Premium or special content sections Note: Requires 3px+ thickness to show the 3D effect properly  GROOVE BORDERS (Inset 3D Effect)  The opposite of ridge - appears carved into the page. Creates a recessed, embedded appearance that suggests depth. Strategic uses: Background information or context sections Historical data or archived information Creating visual wells for data  tables Subdued emphasis that doesn't distract      ?? SHOWCASE 3: CREATIVE BORDER COMBINATIONS   MIXING STYLES, THICKNESSES & COLORS FOR VISUAL IMPACT  TECHNIQUE: Heavy Left Accent  10px left border + thin borders on other sides Creates: A powerful left tab  that draws the eye immediately. Perfect for critical alerts or action items. The contrast between the 10px left border and 2px borders elsewhere creates dynamic visual tension.   TECHNIQUE: Top/Bottom Emphasis with Double Borders   8px double borders top/bottom + 1px solid sides Creates: A formal sandwich effect that frames content horizontally. Excellent for important statements or conclusions. The double-line style adds gravitas and formality, while thin side borders keep focus on horizontal framing.   TECHNIQUE: Progressive Border Weight  Top: 8px  Right: 6px  Bottom: 4px  Left: 2px Creates: A directional flow or gradient effect. Suggests movement or priority direction. Use to subtly guide the reader's eye in a specific direction through the content.   TECHNIQUE: Solid Horizontal + Dashed Vertical  5px solid top/bottom + 3px dashed left/right Creates: Strong horizontal emphasis with softer vertical boundaries. Content feels strapped in securely top/bottom but breathable on sides. Perfect for supplementary information that's important but not primary focus.   TECHNIQUE: Four-Color Heritage Manager)  Top: Red (Critical)  Right: Orange (Important)  Bottom: Green (Good)  Left: Blue (Info) Creates: A visual key or legend built into the border itself. Each side represents a different status or priority. Innovative way to create visual coding in assessment frameworks.   TECHNIQUE: Nested Double-Frame (Certificate Style)  Outer: 8px double border with spacer background Inner: 4px solid border Creates: An elegant double-frame effect like a formal certificate or award. Elevates the importance and formality of  content. Perfect for discharge summaries, procedure reports, formal clinical conclusions, or patient education materials.     TECHNIQUE: Pseudo-Shadow Effect  Main box with 4px border + offset background table creates depth Creates: An illusion of elevation and shadow, making content appear to float above the page. Subtle 3D effect that adds visual interest without heavy borders.        ?? SHOWCASE 4: CLINICAL DOCUMENTATION WITH ADVANCED BORDERS   REAL-WORLD CLINICAL EXAMPLES USING BORDER MASTERY  ? CRITICAL LAB VALUE ALERT ?  Parameter Value Critical Range Action Required Potassium 5.8 mEq/L  >5.5 mEq/L HOLD ACE-I, repeat stat, EKG IMMEDIATE ACTION REQUIRED - Patient contacted, medications held, repeat labs ordered     ? TREATMENT MILESTONE ACHIEVED  Metric Baseline Current Improvement A1C 8.2% 6.5% ? 20.7%  Weight 215 lbs 198 lbs ? 17 lbs  BP 152/94 128/78 ? At goal  Excellent patient engagement and treatment response over 12 months. All major metabolic parameters now optimized.   MEDICATION RECONCILIATION  ? CONTINUE - No Changes  Metformin 1000mg  PO BID - excellent adherence Atorvastatin 40mg  PO daily - LDL at goal Aspirin 81mg  PO daily - primary prevention  ?? MODIFY - Dose Adjustment  Lisinopril: INCREASE 10mg  ? 20mg  daily (BP control)  ? DISCONTINUE - Stop Today  Hydrochlorothiazide  25mg  - minimal benefit, K+ concerns in CKD      ?? SHOWCASE 5: BORDER STYLE QUICK REFERENCE GUIDE   COMPLETE BORDER SPECIFICATION REFERENCE  Style  Visual  Clinical Use Case  Recommended Thickness  RTF Compatibility  Solid All-purpose, professional, clean 2-5px standard 6-10px emphasis ? Excellent Dashed Optional info, pending results 3-4px optimal ? Excellent Dotted Patient education, friendly tone 4-5px optimal ? Excellent Double Formal documents, conclusions 4-6px optimal ? Excellent Ridge 3D raised effect, premium content 5-8px (needs  thickness) ? Good (check preview) Groove 3D carved effect, background info 5-8px (needs thickness) ? Good (check preview) Inset Pressed-in effect, form sections 4-6px optimal ? Good (check preview) Outset Raised button effect, action items 4-6px optimal ?  Good (check preview) ?? PRO TIPS FOR BORDER MASTERY:   Thickness Guidelines: 1-2px: Data tables, subtle dividers 3-4px: Standard containers, section breaks 5-6px: Important emphasis, alerts 7-10px: Critical content, document frames Color Strategy: Red: Critical, stop, danger Orange/Yellow: Caution, monitor Green: Success, continue, good Blue: Information, action items Purple: Important, special attention Gray: Neutral, inactive, background Combination Strategies: Thick left + thin others: Creates powerful accent tab effect Double top/bottom + single sides: Formal horizontal framing Nested borders: Outer thick + inner thin creates certificate style Mixed styles: Solid horizontally + dashed vertically for visual interest Progressive thickness: Creates directional flow or emphasis gradient Testing Recommendation: Always preview in Epic before deploying to smart phrases. 3D effects (ridge, groove, inset, outset) may render differently depending on Epic version and RTF engine. Solid, dashed, dotted, and double borders are most reliable across all systems.    ?? BORDER MASTERY: FINAL THOUGHTS   Borders are one of the most underutilized design elements in Epic RTF documentation. While many physicians stick to simple 1px solid borders, the full spectrum of border styles, thicknesses, and combinations opens up powerful possibilities for visual communication.  The key is intentionality: Every border decision should serve a purpose. A 10px red border instantly signals THIS IS CRITICAL. A subtle 2px dashed border whispers this is supplementary. A double border adds formality and gravitas. Ridge and groove borders create  dimensionality.  Start simple: Master solid borders at different thicknesses first. Then experiment with dashed and dotted for variety. Add double borders for formal content. Finally, explore 3D effects (ridge, groove) for special emphasis.  Remember: The goal is clinical clarity, not decoration. Borders should guide the reader's eye, create hierarchy, signal importance, and improve scannability. When used skillfully, borders transform flat documentation into dimensional, navigable, visually compelling clinical communication.  Beauty within limits. Mastery within constraints. Excellence within Epic RTF.

## 2024-10-16 ENCOUNTER — Ambulatory Visit (HOSPITAL_COMMUNITY)
Admission: RE | Admit: 2024-10-16 | Discharge: 2024-10-16 | Disposition: A | Discharge status: Home / Self Care | Source: Ambulatory Visit | Attending: Physician Assistant | Admitting: Physician Assistant

## 2024-10-16 DIAGNOSIS — R1013 Epigastric pain: Secondary | ICD-10-CM | POA: Insufficient documentation

## 2024-10-16 DIAGNOSIS — K769 Liver disease, unspecified: Secondary | ICD-10-CM | POA: Diagnosis not present

## 2024-10-16 DIAGNOSIS — K219 Gastro-esophageal reflux disease without esophagitis: Secondary | ICD-10-CM | POA: Diagnosis not present

## 2024-10-16 DIAGNOSIS — K760 Fatty (change of) liver, not elsewhere classified: Secondary | ICD-10-CM | POA: Diagnosis not present

## 2024-10-16 NOTE — Telephone Encounter (Signed)
 Spoke with pt via phone about denial.She can not afford the medication to high for her. She said she would wait and see what else can do next

## 2024-10-18 ENCOUNTER — Ambulatory Visit: Payer: Self-pay | Admitting: Physician Assistant

## 2024-10-23 ENCOUNTER — Telehealth: Payer: Self-pay

## 2024-10-23 NOTE — Telephone Encounter (Signed)
 Copied from CRM [id number]. Topic: Clinical - Medication Question >> Oct 23, 2024  2:56 PM Anairis L wrote: Reason for CRM: Patient is calling in to see if she can get a replacement for ozempic since insurance wont cover a shot maybe a pill or supplement.  Review and advise please

## 2024-10-24 NOTE — Telephone Encounter (Signed)
 Tried to call pt left message letting pt know that dr jesus is working with insurance to see if he can get approved for pt if she has any other questions or concerns to call office back

## 2024-10-24 NOTE — Progress Notes (Signed)
 10/25/2024 Rebecca Bond [id number] 06-10-1946  Referring provider: Tonita Fallow, MD Primary GI doctor: Dr. Albertus  ASSESSMENT AND PLAN:   Epigastric pain with history of GERD, RUQ pain, has improved with cutting back on ETOH/PPI, pain is seconds, not associated with food 01/28/2022 RUQ US  fatty liver borderline splenomegaly Status post cholecystectomy 1992 08/19/2020 EGD Zenker's diverticulum, 3 cm hiatal hernia, erythematous mucosa, negative H. pylori gastritis 10/16/2024 complete abdominal ultrasound with elastography limited by body habitus hepatic steatosis right hepatic lobe cyst K PA 6.5 Symptoms have improved greater than 50% - continue pantoprazole  once daily in the morning. - continue reduction of alcohol intake.  Diarrhea, resolved with ETOH cessation No blood, dark stools, significant pain, or cramping. - Consider fiber supplement like Benefiber for morning diarrhea. - continue to avoid ETOH  CLL Follows with Dr. 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) 10/16/2024 complete abdominal ultrasound with elastography limited by body habitus hepatic steatosis right hepatic lobe cyst K PA 6.5 US  01/28/2022 with borderline splenomegaly/thrombocytopenia    Latest Ref Rng & Units 06/03/2024    1:30 PM 08/31/2023    3:11 PM 08/10/2023    2:01 PM  Hepatic Function  Total Protein 6.5 - 8.1 g/dL 6.3  6.0  6.3   Albumin 3.5 - 5.0 g/dL 4.5     AST 15 - 41 U/L 21  19  20    ALT 0 - 44 U/L 13  14  18    Alk Phosphatase 38 - 126 U/L 59     Total Bilirubin 0.0 - 1.2 mg/dL 0.9  0.7  0.9    Platelets 89 INR 12/12/2022 1.0  FIB 4 5.04-likely from CLL - need LFTs and CBC monitored every 6 months - evaluation with imaging every 2-3 years.  -Continue to work on risk factor modification including diet exercise and control of risk factors  including blood sugars. - cessation of alcohol  Morbid obesity  Body mass index is 37.55 kg/m.  -Patient has been advised to make an attempt to improve diet and exercise patterns to aid in weight loss. -Recommended diet heavy in fruits and veggies and low in animal meats, cheeses, and dairy products, appropriate calorie intake Trying to get on wegovy   CPAP on OSA But states she had normal test and stopped using ? Need CPAP? Can use GLP1 for OSA    Patient Care Team: Jesus Bernardino MATSU, MD as PCP - General (Internal Medicine) Burnard Debby LABOR, MD (Inactive) as PCP - Cardiology (Cardiology) Onesimo Emaline Brink, MD as Consulting Physician (Hematology) Octavia Bruckner, MD as Consulting Physician (Ophthalmology) Pyrtle, Gordy HERO, MD as Consulting Physician (Gastroenterology)  HISTORY OF PRESENT ILLNESS: 78 y.o. female with a past medical history listed below presents for evaluation of epigastric pain.   Patient was last seen in the office 07/06/2020 by Dr. Albertus for abdominal pain.  Discussed the use of AI scribe software for clinical note transcription with the patient, who gave verbal consent to proceed.  History of Present Illness   Rebecca Bond is a 78 year old female who presents with abdominal pain and bloating.  She has been experiencing abdominal pain and bloating since August. The pain is localized to the right side, occurs randomly, and lasts only a few seconds. The patient reports that her pain is better than fifty percent improved compared to her previous visit,  and she has been taking pantoprazole  and has reduced her alcohol intake. The pain is not severe enough to require immediate medical attention, and bloating has also improved.  She is currently taking pantoprazole . Her bowel movements have become more formed since cutting back on alcohol, although she has not added fiber to her diet.  She is being treated for anxiety with gabapentin  and doxepin , and she reports  that she is able to sleep and is getting rest. She needs to be cautious when getting up to avoid dizziness.  She has a history of a broken foot from August and continues to see a specialist for ongoing issues. She also mentions a past hip replacement and a fall that led to the foot injury.  She denies having sleep apnea currently, although she used a CPAP machine in the past. She reports no longer snoring or breathing with her mouth open during sleep.        She  reports that she quit smoking about 55 years ago. Her smoking use included cigarettes. She started smoking about 59 years ago. She has a 3 pack-year smoking history. She has never used smokeless tobacco. She reports current alcohol use of about 7.0 standard drinks of alcohol per week. She reports that she does not use drugs.  RELEVANT GI HISTORY, IMAGING AND LABS: Results   LABS Thrombocytopenia  RADIOLOGY Liver ultrasound: hepatic steatosis without fibrosis or cirrhosis      CBC    Component Value Date/Time   WBC 34.9 (H) 06/03/2024 1330   WBC 38.1 (H) 08/31/2023 1511   RBC 3.88 06/03/2024 1330   HGB 12.3 06/03/2024 1330   HGB 11.2 (L) 11/08/2017 1418   HCT 35.7 (L) 06/03/2024 1330   HCT 34.8 11/08/2017 1418   PLT 89 (L) 06/03/2024 1330   PLT 140 (L) 11/08/2017 1418   MCV 92.0 06/03/2024 1330   MCV 96.4 11/08/2017 1418   MCH 31.7 06/03/2024 1330   MCHC 34.5 06/03/2024 1330   RDW 12.9 06/03/2024 1330   RDW 14.8 (H) 11/08/2017 1418   LYMPHSABS 28.9 (H) 06/03/2024 1330   LYMPHSABS 87.0 (H) 11/08/2017 1418   MONOABS 1.2 (H) 06/03/2024 1330   MONOABS 1.5 (H) 11/08/2017 1418   EOSABS 0.2 06/03/2024 1330   EOSABS 0.6 (H) 11/08/2017 1418   BASOSABS 0.1 06/03/2024 1330   BASOSABS 0.2 (H) 11/08/2017 1418   Recent Labs    06/03/24 1330  HGB 12.3    CMP     Component Value Date/Time   NA 135 06/03/2024 1330   NA 134 (L) 11/08/2017 1418   K 4.0 06/03/2024 1330   K 4.1 11/08/2017 1418   CL 99 06/03/2024 1330    CL 102 08/10/2012 1455   CO2 27 06/03/2024 1330   CO2 23 11/08/2017 1418   GLUCOSE 117 (H) 06/03/2024 1330   GLUCOSE 96 11/08/2017 1418   GLUCOSE 107 (H) 08/10/2012 1455   BUN 15 06/03/2024 1330   BUN 14.0 11/08/2017 1418   CREATININE 0.83 06/03/2024 1330   CREATININE 0.99 08/31/2023 1511   CREATININE 0.9 11/08/2017 1418   CALCIUM  10.0 06/03/2024 1330   CALCIUM  9.7 11/08/2017 1418   PROT 6.3 (L) 06/03/2024 1330   PROT 6.9 11/08/2017 1418   ALBUMIN 4.5 06/03/2024 1330   ALBUMIN 4.2 11/08/2017 1418   AST 21 06/03/2024 1330   AST 24 11/08/2017 1418   ALT 13 06/03/2024 1330   ALT 23 11/08/2017 1418   ALKPHOS 59 06/03/2024 1330   ALKPHOS 62  11/08/2017 1418   BILITOT 0.9 06/03/2024 1330   BILITOT 1.05 11/08/2017 1418   GFRNONAA >60 06/03/2024 1330   GFRNONAA 49 (L) 05/13/2021 1340   GFRAA 57 (L) 05/13/2021 1340      Latest Ref Rng & Units 06/03/2024    1:30 PM 08/31/2023    3:11 PM 08/10/2023    2:01 PM  Hepatic Function  Total Protein 6.5 - 8.1 g/dL 6.3  6.0  6.3   Albumin 3.5 - 5.0 g/dL 4.5     AST 15 - 41 U/L 21  19  20    ALT 0 - 44 U/L 13  14  18    Alk Phosphatase 38 - 126 U/L 59     Total Bilirubin 0.0 - 1.2 mg/dL 0.9  0.7  0.9       Current Medications:   Current Outpatient Medications (Endocrine & Metabolic):    levothyroxine  (SYNTHROID ) 50 MCG tablet, TAKE 1 TABLET(50 MCG) BY MOUTH DAILY  Current Outpatient Medications (Cardiovascular):    bisoprolol -hydrochlorothiazide  (ZIAC ) 10-6.25 MG tablet, Take 1 tablet by mouth every morning.     Current Outpatient Medications (Other):    Ascorbic Acid  (VITAMIN C PO), 1500 mg by oral route.   Cholecalciferol  50 MCG (2000 UT) TABS, 10,000 units   doxepin  (SINEQUAN ) 10 MG capsule, Take 1 capsule (10 mg total) by mouth at bedtime.   famotidine  (PEPCID ) 40 MG tablet, TAKE 1 TABLET BY MOUTH EVERYDAY AT BEDTIME   FLUoxetine  (PROZAC ) 20 MG capsule, Take 1 capsule (20 mg total) by mouth daily.   gabapentin  (NEURONTIN ) 300 MG  capsule, Take 1 capsule (300 mg total) by mouth 3 (three) times daily.   Magnesium  Bisglycinate 655 MG CAPS, Take 1 tablet by mouth at bedtime as needed.   pantoprazole  (PROTONIX ) 40 MG tablet, Take 1 tablet (40 mg total) by mouth daily.   semaglutide -weight management (WEGOVY ) 0.25 MG/0.5ML SOAJ SQ injection, Inject 0.25 mg into the skin once a week. (Patient not taking: Reported on 10/25/2024)  Medical History:  Past Medical History:  Diagnosis Date   Abnormal glucose    AKI (acute kidney injury) 04/08/2020   Anemia    Anxiety    Arthritis    CLL (chronic lymphocytic leukemia) (HCC) dx'd ~ 2015   Depression    GERD (gastroesophageal reflux disease)    Headache    sinus headaches   Hepatitis A 08/02/2012   History of kidney stones    Hypertension    Hypocalcemia 04/08/2020   Lab Results      Component    Value    Date/Time           CALCIUM     10.0    06/03/2024 01:30 PM           CALCIUM     9.0    08/31/2023 03:11 PM           CALCIUM     9.7    08/10/2023 02:01 PM           CALCIUM     9.1    08/01/2023 04:04 PM           CALCIUM     9.1    12/27/2022 01:41 PM           CALCIUM     9.7    11/08/2017 02:18 PM           CALCIUM     9.3    07/24/2017 02:17 PM  CALC   Norovirus 04/10/2020   OSA (obstructive sleep apnea)    Osteoarthritis    Pneumonia    Pneumonia due to COVID-19 virus 09/08/2021   Prediabetes    Recurrent sinus infections 08/02/2012   Tubular adenoma of colon    Allergies:  Allergies  Allergen Reactions   Losartan Potassium-Hctz Other (See Comments) and Swelling    messed up my sodium countsand swelled lips   Sudafed [Pseudoephedrine Hcl] Palpitations   Levaquin  [Levofloxacin  In D5w] Diarrhea and Nausea Only   Acyclovir And Related Other (See Comments)    Reaction not recalled   Alendronate Other (See Comments)   Biaxin [Clarithromycin]     GI Upset   Cantaloupe Extract Allergy Skin Test Other (See Comments)    Nasal congestion, headaches, and  sneezing   Celexa [Citalopram] Other (See Comments)    Reaction not recalled   Ciprofloxacin  Nausea And Vomiting   Cyclobenzaprine Other (See Comments)    zombie-like feeling   Fosamax [Alendronate Sodium]     GI upset   Gabapentin  Other (See Comments)    confusion   Iohexol  Hives and Other (See Comments)    Patient stated she broke out in hive 20 yrs ago from IV contrast      Losartan Swelling and Other (See Comments)    Angioedema   Meloxicam     GI upset   Norvasc [Amlodipine] Swelling and Other (See Comments)    Ankles swell   Other Nausea Only and Other (See Comments)    Iceburg lettuce- made me dreadfully nauseous Raw mushrooms only-  made me dreadfully nauseous (cooked ones are tolerated)   Pseudoephedrine     Palpitations   Zoloft [Sertraline Hcl] Other (See Comments)    Has no emotions at all      Surgical History:  She  has a past surgical history that includes Cholecystectomy (1985); Tonsillectomy and adenoidectomy (1950's); Breast surgery; Lymph node biopsy; Temporomandibular joint arthroplasty (1980's); Functional endoscopic sinus surgery (1990's); Abdominal hysterectomy (1980's); laparoscopic appendectomy (N/A, 06/04/2014); immunoglobulin treatment (2017); Appendectomy; Shoulder arthroscopy (Right); Cataract extraction, bilateral; and Lumbar laminectomy/decompression microdiscectomy (Left, 11/01/2017). Family History:  Her family history includes Arthritis in her brother; Heart attack in her brother and maternal grandmother; Hypertension in her maternal grandmother and mother; Parkinson's disease in her mother.  REVIEW OF SYSTEMS  : All other systems reviewed and negative except where noted in the History of Present Illness.  PHYSICAL EXAM: BP 124/76   Pulse 78   Ht 5' 2 (1.575 m)   Wt 205 lb 5 oz (93.1 kg)   SpO2 98%   BMI 37.55 kg/m  Physical Exam   GENERAL APPEARANCE: Well nourished, in no apparent distress HEENT: No cervical lymphadenopathy,  unremarkable thyroid , sclerae anicteric, conjunctiva pink RESPIRATORY: Respiratory effort normal, BS equal bilateral without rales, rhonchi, wheezing CARDIO: RRR with no MRGs, peripheral pulses intact ABDOMEN: Soft, non distended, active bowel sounds in all 4 quadrants, no tenderness to palpation, no rebound, no mass appreciated RECTAL: declines MUSCULOSKELETAL: Full ROM, normal gait, without edema SKIN: Dry, intact without rashes or lesions. No jaundice. NEURO: Alert, oriented, no focal deficits PSYCH: Cooperative, normal mood and affect.      Alan JONELLE Coombs, PA-C 3:40 PM

## 2024-10-25 ENCOUNTER — Encounter: Payer: Self-pay | Admitting: Physician Assistant

## 2024-10-25 ENCOUNTER — Ambulatory Visit: Admitting: Physician Assistant

## 2024-10-25 VITALS — BP 124/76 | HR 78 | Ht 62.0 in | Wt 205.3 lb

## 2024-10-25 DIAGNOSIS — K219 Gastro-esophageal reflux disease without esophagitis: Secondary | ICD-10-CM

## 2024-10-25 DIAGNOSIS — R1013 Epigastric pain: Secondary | ICD-10-CM | POA: Diagnosis not present

## 2024-10-25 DIAGNOSIS — C9110 Chronic lymphocytic leukemia of B-cell type not having achieved remission: Secondary | ICD-10-CM | POA: Diagnosis not present

## 2024-10-25 DIAGNOSIS — K760 Fatty (change of) liver, not elsewhere classified: Secondary | ICD-10-CM

## 2024-10-25 DIAGNOSIS — D696 Thrombocytopenia, unspecified: Secondary | ICD-10-CM

## 2024-10-25 NOTE — Patient Instructions (Addendum)
 VISIT SUMMARY:  Today, we discussed your ongoing abdominal pain and bloating, which have improved with medication and reduced alcohol intake. We also reviewed your fatty liver condition, alcohol use, and weight management options.  YOUR PLAN:  ABDOMINAL PAIN AND BLOATING: You have been experiencing intermittent right-sided abdominal pain and bloating, which have improved with pantoprazole  and reduced alcohol intake. -Continue taking pantoprazole  as prescribed. -Keep reducing your alcohol intake. -Monitor for any difficulty swallowing, black stools, or weight loss. -Consider an endoscopy if your symptoms worsen or if you develop new symptoms.  FATTY LIVER (HEPATIC STEATOSIS): You have a fatty liver without fibrosis or cirrhosis, and low platelets due to chronic lymphocytic leukemia (CLL). -No specific action required at this time.  ALCOHOL USE: You have reduced your alcohol intake, which has helped improve your abdominal symptoms. -Continue to reduce your alcohol intake.  OBESITY: We discussed the potential use of GLP-1 agonists for weight loss, but your insurance denied coverage for Wegovy . We also discussed the side effects and no increased cancer risk. -Monitor for side effects if you start using GLP-1 agonists. -Consider using Miralax if you experience constipation.  Understanding Your Weekly GLP-1 Injection  A helpful guide to managing common side effects  You are on a once-weekly injectable medication in the GLP-1 receptor agonist class. These medications can be very effective for blood sugar control, weight loss, and heart protection, fatty liver or OSA, but they can also come with some side effects that are important to understand. The good news is: most side effects can be managed with a few adjustments.  1. Gastroparesis-Like Symptoms These medications slow down your stomach to help you feel full longer -- great for weight loss and blood sugar control, but they can sometimes  cause symptoms that feel like gastroparesis (slow stomach emptying). Symptoms may include: -Feeling full quickly when eating -Nausea or vomiting -Bloating or abdominal discomfort -Worsening heartburn or reflux -Acid regurgitation -Stomach spasms or tightness What you can do: ??? Eat small, frequent meals (4-6 per day) ?? Drink fluids between meals, not during ?? Avoid high-fiber foods (like raw veggies or whole grains); cook your veggies well ?? Spread protein throughout the day (try Greek yogurt, eggs, soft meats, Glucerna, milk) ?? Choose soft foods you can mash with a fork ?? Switch to pured foods or liquids during flare-ups ?? Consider reading: Living Well with Gastroparesis by Camelia Bone ?? Downloadable Diet Guide: [facility] Gastroparesis Diet PDF  ?? Tip: Try following a gastroparesis-friendly diet on the day of your injection and the day after, when the medication's effect is strongest.  2. Constipation Since this medication slows down your digestive system, constipation is very common. Tips to help: ?? Drink plenty of water ???? Stay active with regular exercise ?? Add fiber-rich but gentle foods like kiwi ?? Try a low-dose magnesium  supplement at night ?? Use MiraLAX (half to one capful daily) if constipation becomes more frequent, especially if your dose increases  If these strategies don't help, talk to your provider -- they may recommend or prescribe other treatments.  3. When to Call the Doctor or Go to the ER While rare, this medication can slightly increase your risk of serious conditions like: Pancreatitis (inflammation of the pancreas) Gallstones or gallbladder problems Watch for these signs and seek help if you experience: Severe abdominal pain (especially in the upper belly or that radiates to your back) Pain in the right upper side of your abdomen Nausea, vomiting, fever, or chills that don't go away ??  Call your provider or go to the ER if  these occur.  4. Who Should NOT Take This Medication? This medication should be avoided if you have: A personal history of pancreatitis  A personal or family history of medullary thyroid  cancer A condition called Multiple Endocrine Neoplasia Syndrome Type 2 (MEN2)  Final Note If the side effects are too bothersome, remember: Most symptoms will go away if you stop the medication. But many people tolerate it well after the first few weeks, especially with the right strategies in place.  Thank you for trusting me with your gastrointestinal care!   Alan Coombs, PA-C  _______________________________________________________  If your blood pressure at your visit was 140/90 or greater, please contact your primary care physician to follow up on this.  _______________________________________________________  If you are age 8 or older, your body mass index should be between 23-30. Your Body mass index is 37.55 kg/m. If this is out of the aforementioned range listed, please consider follow up with your Primary Care Provider.  If you are age 59 or younger, your body mass index should be between 19-25. Your Body mass index is 37.55 kg/m. If this is out of the aformentioned range listed, please consider follow up with your Primary Care Provider.   ________________________________________________________  The [facility] providers would like to encourage you to use MYCHART to communicate with providers for non-urgent requests or questions.  Due to long hold times on the telephone, sending your provider a message by [facility] may be a faster and more efficient way to get a response.  Please allow 48 business hours for a response.  Please remember that this is for non-urgent requests.  _______________________________________________________  [facility] is using a team-based approach to care.  Your team is made up of your doctor and two to three APPS. Our APPS (Nurse Practitioners and  Physician Assistants) work with your physician to ensure care continuity for you. They are fully qualified to address your health concerns and develop a treatment plan. They communicate directly with your gastroenterologist to care for you. Seeing the Advanced Practice Practitioners on your physician's team can help you by facilitating care more promptly, often allowing for earlier appointments, access to diagnostic testing, procedures, and other specialty referrals.

## 2024-11-06 DIAGNOSIS — S92344D Nondisplaced fracture of fourth metatarsal bone, right foot, subsequent encounter for fracture with routine healing: Secondary | ICD-10-CM | POA: Diagnosis not present

## 2024-11-18 ENCOUNTER — Ambulatory Visit: Admitting: Internal Medicine

## 2024-11-21 ENCOUNTER — Ambulatory Visit: Admitting: Nurse Practitioner

## 2024-11-22 ENCOUNTER — Ambulatory Visit: Admitting: Nurse Practitioner

## 2024-11-26 ENCOUNTER — Other Ambulatory Visit: Payer: Self-pay | Admitting: Internal Medicine

## 2024-11-26 NOTE — Telephone Encounter (Signed)
 Copied from CRM [id number]. Topic: Clinical - Medication Refill >> Nov 26, 2024 11:02 AM Ahlexyia S wrote: Medication: bisoprolol -hydrochlorothiazide  (ZIAC ) 10-6.25 MG tablet  Has the patient contacted their pharmacy? Yes, pt was advised to contact pharmacy. (Agent: If no, request that the patient contact the pharmacy for the refill. If patient does not wish to contact the pharmacy document the reason why and proceed with request.) (Agent: If yes, when and what did the pharmacy advise?)  This is the patient's preferred pharmacy:  [id number] - [place], [place] - [address] AT CORNER OF [address] [place] [address] Phone: [phone] Fax: [phone]  Is this the correct pharmacy for this prescription? Yes If no, delete pharmacy and type the correct one.   Has the prescription been filled recently? No  Is the patient out of the medication? No, limited supply.  Has the patient been seen for an appointment in the last year OR does the patient have an upcoming appointment? Yes  Can we respond through MyChart? No  Agent: Please be advised that Rx refills may take up to 3 business days. We ask that you follow-up with your pharmacy.

## 2024-11-27 MED ORDER — BISOPROLOL-HYDROCHLOROTHIAZIDE 10-6.25 MG PO TABS: 1.0000 | ORAL_TABLET | Freq: Every morning | ORAL | 4 refills | Status: AC

## 2024-12-02 ENCOUNTER — Ambulatory Visit: Admitting: Hematology

## 2024-12-02 ENCOUNTER — Other Ambulatory Visit

## 2024-12-04 ENCOUNTER — Ambulatory Visit: Payer: Medicare Other | Admitting: Nurse Practitioner

## 2024-12-06 ENCOUNTER — Other Ambulatory Visit: Payer: Self-pay | Admitting: Physician Assistant

## 2024-12-10 ENCOUNTER — Other Ambulatory Visit: Payer: Self-pay

## 2024-12-10 DIAGNOSIS — C9110 Chronic lymphocytic leukemia of B-cell type not having achieved remission: Secondary | ICD-10-CM

## 2024-12-11 ENCOUNTER — Inpatient Hospital Stay

## 2024-12-11 ENCOUNTER — Inpatient Hospital Stay: Admitting: Hematology

## 2024-12-11 ENCOUNTER — Telehealth: Payer: Self-pay | Admitting: Hematology

## 2024-12-16 ENCOUNTER — Ambulatory Visit

## 2024-12-18 ENCOUNTER — Ambulatory Visit: Admitting: Internal Medicine

## 2024-12-31 ENCOUNTER — Ambulatory Visit

## 2025-01-03 ENCOUNTER — Ambulatory Visit: Payer: Self-pay

## 2025-01-03 NOTE — Telephone Encounter (Signed)
 Spoke with pt states she is not able to do Virtual per provider wanted to offer her virtual visit . Pt states she does not have internet nor mychart set up. Pt states if gets worse she will figure out how to get to ed or urgent care.

## 2025-01-03 NOTE — Telephone Encounter (Signed)
**  Pt able to come into office or UC d/t icy conditions. ED advised if symptoms worsen or fail to improve. Requesting if antibiotic could be sent to the below pharmacy as they will deliver to her:  [id number] - [place], [place] - [address] AT [address] OF [facility] [address] [[id number]]   FYI Only or Action Required?: Action required by provider: clinical question for provider.  Patient was last seen in primary care on 10/11/2024 by Jesus Bernardino MATSU, MD.  Called Nurse Triage reporting Dysuria.  Symptoms began yesterday.  Interventions attempted: Rest, hydration, or home remedies.  Symptoms are: unchanged.  Triage Disposition: See HCP Within 4 Hours (Or PCP Triage)  Patient/caregiver understands and will follow disposition?:   Reason for Disposition  Side (flank) or lower back pain present  Answer Assessment - Initial Assessment Questions Pt reports burning with urination and increased frequency that began 01/02/25. Denies fever or hematuria, has mild flank pain.  Message sent to PCP office re: request for antibiotics. Advised ED if no improvement, worsening, or new symptoms including fever, nausea, vomiting, abdominal pain, hematuria.  ONSET: When did the painful urination start?      01/02/25 FEVER: Do you have a fever? If Yes, ask: What is your temperature, how was it measured, and when did it start?     Denies PAST UTI: Have you had a urine infection before? If Yes, ask: When was the last time? and What happened that time?      Yes, but states not often CAUSE: What do you think is causing the painful urination?  (e.g., UTI, scratch, Herpes sore)     UTI OTHER SYMPTOMS: Do you have any other symptoms? (e.g., blood in urine, flank pain, genital sores, urgency, vaginal discharge)     Mild flank pain  Protocols used: Urination Pain - Female-A-AH  Message from [place] [facility] sent at 01/03/2025  2:17 PM EST  Reason for Triage: Possible UTI, burning pain while  urinating, frequency, pressure on bladder

## 2025-01-06 ENCOUNTER — Inpatient Hospital Stay: Payer: Self-pay

## 2025-01-06 ENCOUNTER — Inpatient Hospital Stay: Admitting: Hematology

## 2025-01-10 ENCOUNTER — Ambulatory Visit: Admitting: Internal Medicine

## 2025-01-17 ENCOUNTER — Ambulatory Visit: Admitting: Internal Medicine

## 2025-01-29 ENCOUNTER — Ambulatory Visit

## 2025-02-04 ENCOUNTER — Inpatient Hospital Stay

## 2025-02-04 ENCOUNTER — Inpatient Hospital Stay: Admitting: Hematology
